# Patient Record
Sex: Female | Born: 1960 | Race: Black or African American | Hispanic: No | Marital: Married | Smoking: Never smoker
Health system: Southern US, Community
[De-identification: ages and names within clinical notes are randomized; demographics above are authoritative.]

## PROBLEM LIST (undated history)

## (undated) DIAGNOSIS — I639 Cerebral infarction, unspecified: Secondary | ICD-10-CM

## (undated) DIAGNOSIS — I428 Other cardiomyopathies: Secondary | ICD-10-CM

## (undated) DIAGNOSIS — E875 Hyperkalemia: Secondary | ICD-10-CM

## (undated) DIAGNOSIS — R42 Dizziness and giddiness: Principal | ICD-10-CM

## (undated) DIAGNOSIS — N186 End stage renal disease: Secondary | ICD-10-CM

## (undated) DIAGNOSIS — IMO0001 Reserved for inherently not codable concepts without codable children: Secondary | ICD-10-CM

## (undated) DIAGNOSIS — N189 Chronic kidney disease, unspecified: Secondary | ICD-10-CM

## (undated) DIAGNOSIS — E119 Type 2 diabetes mellitus without complications: Secondary | ICD-10-CM

## (undated) DIAGNOSIS — K219 Gastro-esophageal reflux disease without esophagitis: Secondary | ICD-10-CM

## (undated) DIAGNOSIS — Z7901 Long term (current) use of anticoagulants: Secondary | ICD-10-CM

## (undated) DIAGNOSIS — B9681 Helicobacter pylori [H. pylori] as the cause of diseases classified elsewhere: Principal | ICD-10-CM

## (undated) DIAGNOSIS — R1310 Dysphagia, unspecified: Secondary | ICD-10-CM

## (undated) DIAGNOSIS — Z794 Long term (current) use of insulin: Secondary | ICD-10-CM

## (undated) DIAGNOSIS — K2970 Gastritis, unspecified, without bleeding: Principal | ICD-10-CM

## (undated) DIAGNOSIS — I482 Chronic atrial fibrillation, unspecified: Secondary | ICD-10-CM

## (undated) DIAGNOSIS — D472 Monoclonal gammopathy: Secondary | ICD-10-CM

## (undated) DIAGNOSIS — Q246 Congenital heart block: Secondary | ICD-10-CM

## (undated) DIAGNOSIS — N200 Calculus of kidney: Secondary | ICD-10-CM

## (undated) DIAGNOSIS — R229 Localized swelling, mass and lump, unspecified: Secondary | ICD-10-CM

## (undated) DIAGNOSIS — Z95810 Presence of automatic (implantable) cardiac defibrillator: Secondary | ICD-10-CM

## (undated) DIAGNOSIS — I5022 Chronic systolic (congestive) heart failure: Secondary | ICD-10-CM

## (undated) HISTORY — DX: Chronic atrial fibrillation, unspecified: I48.20

## (undated) HISTORY — DX: Long term (current) use of anticoagulants: Z79.01

## (undated) HISTORY — DX: Congenital heart block: Q24.6

## (undated) HISTORY — DX: Helicobacter pylori (H. pylori) as the cause of diseases classified elsewhere: B96.81

## (undated) HISTORY — DX: Hyperkalemia: E87.5

## (undated) HISTORY — DX: Dysphagia, unspecified: R13.10

## (undated) HISTORY — DX: Gastritis, unspecified, without bleeding: K29.70

## (undated) HISTORY — DX: Gastro-esophageal reflux disease without esophagitis: K21.9

## (undated) HISTORY — DX: Chronic kidney disease, unspecified: N18.9

## (undated) HISTORY — DX: Dizziness and giddiness: R42

---

## 1988-10-26 DIAGNOSIS — N200 Calculus of kidney: Secondary | ICD-10-CM

## 1988-10-26 HISTORY — DX: Calculus of kidney: N20.0

## 1997-02-25 DIAGNOSIS — I639 Cerebral infarction, unspecified: Secondary | ICD-10-CM

## 1997-02-25 HISTORY — DX: Cerebral infarction, unspecified: I63.9

## 1997-08-09 ENCOUNTER — Inpatient Hospital Stay (HOSPITAL_COMMUNITY): Admission: AD | Admit: 1997-08-09 | Discharge: 1997-08-15 | Payer: Self-pay | Admitting: Cardiology

## 1997-09-25 HISTORY — PX: INSERT / REPLACE / REMOVE PACEMAKER: SUR710

## 1997-10-05 ENCOUNTER — Observation Stay (HOSPITAL_COMMUNITY): Admission: RE | Admit: 1997-10-05 | Discharge: 1997-10-06 | Payer: Self-pay | Admitting: Internal Medicine

## 2000-11-05 ENCOUNTER — Other Ambulatory Visit: Admission: RE | Admit: 2000-11-05 | Discharge: 2000-11-05 | Payer: Self-pay | Admitting: Specialist

## 2000-11-13 ENCOUNTER — Encounter: Payer: Self-pay | Admitting: Specialist

## 2000-11-13 ENCOUNTER — Ambulatory Visit (HOSPITAL_COMMUNITY): Admission: RE | Admit: 2000-11-13 | Discharge: 2000-11-13 | Payer: Self-pay | Admitting: Specialist

## 2001-06-17 ENCOUNTER — Ambulatory Visit (HOSPITAL_COMMUNITY): Admission: RE | Admit: 2001-06-17 | Discharge: 2001-06-17 | Payer: Self-pay | Admitting: Cardiology

## 2001-10-16 ENCOUNTER — Emergency Department (HOSPITAL_COMMUNITY): Admission: EM | Admit: 2001-10-16 | Discharge: 2001-10-16 | Payer: Self-pay | Admitting: Emergency Medicine

## 2001-11-17 ENCOUNTER — Ambulatory Visit (HOSPITAL_COMMUNITY): Admission: RE | Admit: 2001-11-17 | Discharge: 2001-11-17 | Payer: Self-pay | Admitting: Specialist

## 2001-11-17 ENCOUNTER — Encounter: Payer: Self-pay | Admitting: Specialist

## 2002-12-08 ENCOUNTER — Ambulatory Visit (HOSPITAL_COMMUNITY): Admission: RE | Admit: 2002-12-08 | Discharge: 2002-12-08 | Payer: Self-pay | Admitting: Specialist

## 2002-12-08 ENCOUNTER — Encounter: Payer: Self-pay | Admitting: Specialist

## 2002-12-29 ENCOUNTER — Emergency Department (HOSPITAL_COMMUNITY): Admission: EM | Admit: 2002-12-29 | Discharge: 2002-12-29 | Payer: Self-pay | Admitting: Emergency Medicine

## 2003-02-16 ENCOUNTER — Encounter: Admission: RE | Admit: 2003-02-16 | Discharge: 2003-05-17 | Payer: Self-pay | Admitting: Cardiology

## 2003-03-15 ENCOUNTER — Ambulatory Visit (HOSPITAL_COMMUNITY): Admission: RE | Admit: 2003-03-15 | Discharge: 2003-03-15 | Payer: Self-pay | Admitting: Internal Medicine

## 2003-04-25 ENCOUNTER — Emergency Department (HOSPITAL_COMMUNITY): Admission: EM | Admit: 2003-04-25 | Discharge: 2003-04-25 | Payer: Self-pay | Admitting: Emergency Medicine

## 2003-07-23 ENCOUNTER — Emergency Department (HOSPITAL_COMMUNITY): Admission: EM | Admit: 2003-07-23 | Discharge: 2003-07-23 | Payer: Self-pay | Admitting: Emergency Medicine

## 2003-07-25 ENCOUNTER — Emergency Department (HOSPITAL_COMMUNITY): Admission: EM | Admit: 2003-07-25 | Discharge: 2003-07-25 | Payer: Self-pay | Admitting: Emergency Medicine

## 2003-08-11 ENCOUNTER — Emergency Department (HOSPITAL_COMMUNITY): Admission: EM | Admit: 2003-08-11 | Discharge: 2003-08-11 | Payer: Self-pay | Admitting: Emergency Medicine

## 2003-08-12 ENCOUNTER — Emergency Department (HOSPITAL_COMMUNITY): Admission: EM | Admit: 2003-08-12 | Discharge: 2003-08-12 | Payer: Self-pay | Admitting: Emergency Medicine

## 2003-10-05 ENCOUNTER — Ambulatory Visit (HOSPITAL_COMMUNITY): Admission: RE | Admit: 2003-10-05 | Discharge: 2003-10-05 | Payer: Self-pay | Admitting: Internal Medicine

## 2003-11-25 ENCOUNTER — Ambulatory Visit: Payer: Self-pay | Admitting: Internal Medicine

## 2003-12-09 ENCOUNTER — Ambulatory Visit: Payer: Self-pay | Admitting: Internal Medicine

## 2003-12-22 ENCOUNTER — Ambulatory Visit (HOSPITAL_COMMUNITY): Admission: RE | Admit: 2003-12-22 | Discharge: 2003-12-22 | Payer: Self-pay | Admitting: Specialist

## 2004-01-18 ENCOUNTER — Ambulatory Visit: Payer: Self-pay | Admitting: Cardiology

## 2004-01-24 ENCOUNTER — Ambulatory Visit: Payer: Self-pay | Admitting: Internal Medicine

## 2004-01-24 ENCOUNTER — Ambulatory Visit: Payer: Self-pay

## 2004-01-26 ENCOUNTER — Ambulatory Visit: Payer: Self-pay | Admitting: Internal Medicine

## 2004-01-26 HISTORY — PX: PACEMAKER GENERATOR CHANGE: SHX5998

## 2004-01-30 ENCOUNTER — Ambulatory Visit: Payer: Self-pay | Admitting: Internal Medicine

## 2004-01-31 ENCOUNTER — Ambulatory Visit (HOSPITAL_COMMUNITY): Admission: RE | Admit: 2004-01-31 | Discharge: 2004-01-31 | Payer: Self-pay | Admitting: Internal Medicine

## 2004-02-08 ENCOUNTER — Ambulatory Visit: Payer: Self-pay | Admitting: Cardiology

## 2004-02-14 ENCOUNTER — Ambulatory Visit: Payer: Self-pay

## 2004-02-15 ENCOUNTER — Ambulatory Visit: Payer: Self-pay | Admitting: Cardiology

## 2004-03-01 ENCOUNTER — Ambulatory Visit: Payer: Self-pay | Admitting: *Deleted

## 2004-04-06 ENCOUNTER — Ambulatory Visit: Payer: Self-pay | Admitting: *Deleted

## 2004-05-15 ENCOUNTER — Ambulatory Visit: Payer: Self-pay | Admitting: Cardiology

## 2004-06-11 ENCOUNTER — Ambulatory Visit: Payer: Self-pay | Admitting: Cardiology

## 2004-07-11 ENCOUNTER — Ambulatory Visit: Payer: Self-pay | Admitting: *Deleted

## 2004-08-07 ENCOUNTER — Ambulatory Visit: Payer: Self-pay | Admitting: *Deleted

## 2004-08-14 ENCOUNTER — Ambulatory Visit: Payer: Self-pay | Admitting: Internal Medicine

## 2004-09-19 ENCOUNTER — Ambulatory Visit: Payer: Self-pay | Admitting: *Deleted

## 2004-11-02 ENCOUNTER — Ambulatory Visit: Payer: Self-pay | Admitting: *Deleted

## 2004-11-30 ENCOUNTER — Ambulatory Visit: Payer: Self-pay | Admitting: *Deleted

## 2004-12-24 ENCOUNTER — Ambulatory Visit (HOSPITAL_COMMUNITY): Admission: RE | Admit: 2004-12-24 | Discharge: 2004-12-24 | Payer: Self-pay | Admitting: Specialist

## 2004-12-31 ENCOUNTER — Ambulatory Visit: Payer: Self-pay | Admitting: Cardiology

## 2005-01-28 ENCOUNTER — Ambulatory Visit (HOSPITAL_COMMUNITY): Admission: RE | Admit: 2005-01-28 | Discharge: 2005-01-28 | Payer: Self-pay | Admitting: Nephrology

## 2005-02-04 ENCOUNTER — Ambulatory Visit: Payer: Self-pay | Admitting: *Deleted

## 2005-03-12 ENCOUNTER — Ambulatory Visit: Payer: Self-pay | Admitting: *Deleted

## 2005-04-17 ENCOUNTER — Ambulatory Visit: Payer: Self-pay | Admitting: *Deleted

## 2005-05-03 ENCOUNTER — Ambulatory Visit: Payer: Self-pay | Admitting: Internal Medicine

## 2005-05-14 ENCOUNTER — Ambulatory Visit: Payer: Self-pay | Admitting: Cardiology

## 2005-06-20 ENCOUNTER — Ambulatory Visit: Payer: Self-pay | Admitting: Cardiology

## 2005-07-17 ENCOUNTER — Ambulatory Visit: Payer: Self-pay | Admitting: *Deleted

## 2005-08-09 ENCOUNTER — Ambulatory Visit: Payer: Self-pay | Admitting: Internal Medicine

## 2005-08-22 ENCOUNTER — Ambulatory Visit: Payer: Self-pay | Admitting: Cardiology

## 2005-09-26 ENCOUNTER — Ambulatory Visit: Payer: Self-pay | Admitting: *Deleted

## 2005-10-29 ENCOUNTER — Ambulatory Visit: Payer: Self-pay | Admitting: Cardiology

## 2005-11-11 ENCOUNTER — Ambulatory Visit: Payer: Self-pay | Admitting: Internal Medicine

## 2005-11-13 ENCOUNTER — Ambulatory Visit: Payer: Self-pay | Admitting: Cardiology

## 2005-12-17 ENCOUNTER — Ambulatory Visit: Payer: Self-pay | Admitting: Internal Medicine

## 2005-12-21 ENCOUNTER — Ambulatory Visit (HOSPITAL_COMMUNITY): Admission: RE | Admit: 2005-12-21 | Discharge: 2005-12-21 | Payer: Self-pay | Admitting: Internal Medicine

## 2006-01-17 ENCOUNTER — Ambulatory Visit: Payer: Self-pay | Admitting: Cardiology

## 2006-02-03 ENCOUNTER — Ambulatory Visit (HOSPITAL_COMMUNITY): Admission: RE | Admit: 2006-02-03 | Discharge: 2006-02-03 | Payer: Self-pay | Admitting: Specialist

## 2006-02-06 ENCOUNTER — Ambulatory Visit: Payer: Self-pay | Admitting: Internal Medicine

## 2006-02-20 ENCOUNTER — Ambulatory Visit: Payer: Self-pay | Admitting: Cardiology

## 2006-03-11 ENCOUNTER — Ambulatory Visit: Payer: Self-pay | Admitting: Internal Medicine

## 2006-03-20 ENCOUNTER — Ambulatory Visit: Payer: Self-pay | Admitting: Cardiology

## 2006-04-25 ENCOUNTER — Ambulatory Visit: Payer: Self-pay | Admitting: Cardiology

## 2006-05-23 ENCOUNTER — Ambulatory Visit: Payer: Self-pay | Admitting: Cardiology

## 2006-06-04 ENCOUNTER — Ambulatory Visit: Payer: Self-pay | Admitting: Internal Medicine

## 2006-06-13 ENCOUNTER — Ambulatory Visit: Payer: Self-pay | Admitting: Cardiology

## 2006-07-14 ENCOUNTER — Ambulatory Visit: Payer: Self-pay | Admitting: Cardiology

## 2006-08-20 ENCOUNTER — Ambulatory Visit: Payer: Self-pay | Admitting: Cardiovascular Disease

## 2006-08-26 ENCOUNTER — Ambulatory Visit: Payer: Self-pay | Admitting: Internal Medicine

## 2006-09-17 ENCOUNTER — Ambulatory Visit: Payer: Self-pay | Admitting: Cardiovascular Disease

## 2006-10-15 ENCOUNTER — Ambulatory Visit: Payer: Self-pay | Admitting: Cardiovascular Disease

## 2006-11-18 ENCOUNTER — Ambulatory Visit: Payer: Self-pay | Admitting: Internal Medicine

## 2006-11-19 ENCOUNTER — Ambulatory Visit: Payer: Self-pay | Admitting: Cardiology

## 2006-12-03 ENCOUNTER — Ambulatory Visit: Payer: Self-pay | Admitting: Cardiovascular Disease

## 2006-12-19 ENCOUNTER — Emergency Department (HOSPITAL_COMMUNITY): Admission: EM | Admit: 2006-12-19 | Discharge: 2006-12-19 | Payer: Self-pay | Admitting: Emergency Medicine

## 2007-01-02 ENCOUNTER — Ambulatory Visit: Payer: Self-pay | Admitting: Cardiovascular Disease

## 2007-01-12 ENCOUNTER — Emergency Department (HOSPITAL_COMMUNITY): Admission: EM | Admit: 2007-01-12 | Discharge: 2007-01-12 | Payer: Self-pay | Admitting: Emergency Medicine

## 2007-02-02 ENCOUNTER — Ambulatory Visit: Payer: Self-pay | Admitting: Cardiology

## 2007-02-10 ENCOUNTER — Ambulatory Visit (HOSPITAL_COMMUNITY): Admission: RE | Admit: 2007-02-10 | Discharge: 2007-02-10 | Payer: Self-pay | Admitting: Specialist

## 2007-02-10 ENCOUNTER — Ambulatory Visit: Payer: Self-pay | Admitting: Internal Medicine

## 2007-02-12 ENCOUNTER — Ambulatory Visit: Payer: Self-pay | Admitting: Cardiovascular Disease

## 2007-03-05 ENCOUNTER — Ambulatory Visit: Payer: Self-pay | Admitting: Internal Medicine

## 2007-04-06 ENCOUNTER — Ambulatory Visit: Payer: Self-pay | Admitting: Internal Medicine

## 2007-05-05 ENCOUNTER — Ambulatory Visit: Payer: Self-pay | Admitting: Cardiology

## 2007-05-07 ENCOUNTER — Ambulatory Visit: Payer: Self-pay | Admitting: Internal Medicine

## 2007-06-03 ENCOUNTER — Ambulatory Visit: Payer: Self-pay | Admitting: Cardiology

## 2007-07-08 ENCOUNTER — Ambulatory Visit: Payer: Self-pay | Admitting: Cardiology

## 2007-07-21 ENCOUNTER — Ambulatory Visit: Payer: Self-pay | Admitting: Cardiology

## 2007-08-03 ENCOUNTER — Ambulatory Visit: Payer: Self-pay | Admitting: Cardiology

## 2007-08-06 ENCOUNTER — Ambulatory Visit (HOSPITAL_COMMUNITY): Admission: RE | Admit: 2007-08-06 | Discharge: 2007-08-06 | Payer: Self-pay | Admitting: Internal Medicine

## 2007-08-06 ENCOUNTER — Ambulatory Visit: Payer: Self-pay | Admitting: Cardiovascular Disease

## 2007-09-01 ENCOUNTER — Ambulatory Visit: Payer: Self-pay | Admitting: Cardiology

## 2007-09-30 ENCOUNTER — Ambulatory Visit: Payer: Self-pay | Admitting: Cardiology

## 2007-10-08 ENCOUNTER — Ambulatory Visit: Payer: Self-pay | Admitting: Cardiology

## 2007-10-22 ENCOUNTER — Ambulatory Visit: Payer: Self-pay | Admitting: Cardiology

## 2007-10-26 ENCOUNTER — Emergency Department (HOSPITAL_COMMUNITY): Admission: EM | Admit: 2007-10-26 | Discharge: 2007-10-26 | Payer: Self-pay | Admitting: Emergency Medicine

## 2007-11-06 ENCOUNTER — Ambulatory Visit: Payer: Self-pay | Admitting: Internal Medicine

## 2007-11-12 ENCOUNTER — Ambulatory Visit: Payer: Self-pay | Admitting: Cardiology

## 2007-12-10 ENCOUNTER — Ambulatory Visit: Payer: Self-pay | Admitting: Cardiology

## 2007-12-17 ENCOUNTER — Emergency Department (HOSPITAL_COMMUNITY): Admission: EM | Admit: 2007-12-17 | Discharge: 2007-12-17 | Payer: Self-pay | Admitting: Emergency Medicine

## 2007-12-24 ENCOUNTER — Ambulatory Visit: Payer: Self-pay | Admitting: Cardiology

## 2008-01-03 ENCOUNTER — Emergency Department (HOSPITAL_COMMUNITY): Admission: EM | Admit: 2008-01-03 | Discharge: 2008-01-03 | Payer: Self-pay | Admitting: Emergency Medicine

## 2008-01-06 ENCOUNTER — Ambulatory Visit: Payer: Self-pay | Admitting: Internal Medicine

## 2008-01-20 DIAGNOSIS — I1310 Hypertensive heart and chronic kidney disease without heart failure, with stage 1 through stage 4 chronic kidney disease, or unspecified chronic kidney disease: Secondary | ICD-10-CM

## 2008-01-20 DIAGNOSIS — I428 Other cardiomyopathies: Secondary | ICD-10-CM

## 2008-01-20 DIAGNOSIS — E1129 Type 2 diabetes mellitus with other diabetic kidney complication: Secondary | ICD-10-CM

## 2008-01-20 DIAGNOSIS — I4891 Unspecified atrial fibrillation: Secondary | ICD-10-CM | POA: Insufficient documentation

## 2008-01-20 DIAGNOSIS — E1165 Type 2 diabetes mellitus with hyperglycemia: Secondary | ICD-10-CM

## 2008-02-04 ENCOUNTER — Ambulatory Visit: Payer: Self-pay | Admitting: Cardiology

## 2008-02-05 ENCOUNTER — Ambulatory Visit: Payer: Self-pay | Admitting: Internal Medicine

## 2008-02-12 ENCOUNTER — Ambulatory Visit (HOSPITAL_COMMUNITY): Admission: RE | Admit: 2008-02-12 | Discharge: 2008-02-12 | Payer: Self-pay | Admitting: Obstetrics and Gynecology

## 2008-03-04 ENCOUNTER — Other Ambulatory Visit: Admission: RE | Admit: 2008-03-04 | Discharge: 2008-03-04 | Payer: Self-pay | Admitting: Obstetrics and Gynecology

## 2008-03-07 ENCOUNTER — Ambulatory Visit: Payer: Self-pay | Admitting: Cardiology

## 2008-03-08 ENCOUNTER — Ambulatory Visit (HOSPITAL_COMMUNITY): Admission: RE | Admit: 2008-03-08 | Discharge: 2008-03-08 | Payer: Self-pay | Admitting: Internal Medicine

## 2008-03-08 ENCOUNTER — Ambulatory Visit: Payer: Self-pay | Admitting: Internal Medicine

## 2008-04-06 ENCOUNTER — Encounter: Payer: Self-pay | Admitting: Internal Medicine

## 2008-04-14 ENCOUNTER — Ambulatory Visit: Payer: Self-pay | Admitting: Cardiology

## 2008-05-12 ENCOUNTER — Ambulatory Visit: Payer: Self-pay | Admitting: Cardiology

## 2008-06-13 ENCOUNTER — Ambulatory Visit: Payer: Self-pay | Admitting: Cardiology

## 2008-06-15 ENCOUNTER — Ambulatory Visit (HOSPITAL_COMMUNITY): Admission: RE | Admit: 2008-06-15 | Discharge: 2008-06-15 | Payer: Self-pay | Admitting: Internal Medicine

## 2008-06-16 ENCOUNTER — Ambulatory Visit: Payer: Self-pay | Admitting: Internal Medicine

## 2008-07-07 ENCOUNTER — Ambulatory Visit: Payer: Self-pay | Admitting: Cardiology

## 2008-07-12 ENCOUNTER — Encounter: Payer: Self-pay | Admitting: Cardiology

## 2008-07-12 ENCOUNTER — Ambulatory Visit: Payer: Self-pay | Admitting: Cardiovascular Disease

## 2008-07-12 ENCOUNTER — Ambulatory Visit (HOSPITAL_COMMUNITY): Admission: RE | Admit: 2008-07-12 | Discharge: 2008-07-12 | Payer: Self-pay | Admitting: Cardiology

## 2008-08-05 ENCOUNTER — Ambulatory Visit: Payer: Self-pay | Admitting: Cardiology

## 2008-08-05 ENCOUNTER — Encounter: Payer: Self-pay | Admitting: Internal Medicine

## 2008-08-19 ENCOUNTER — Ambulatory Visit: Payer: Self-pay | Admitting: Cardiology

## 2008-08-19 ENCOUNTER — Encounter: Payer: Self-pay | Admitting: Cardiology

## 2008-08-25 ENCOUNTER — Ambulatory Visit: Payer: Self-pay | Admitting: Cardiology

## 2008-09-15 ENCOUNTER — Ambulatory Visit: Payer: Self-pay | Admitting: Cardiology

## 2008-09-21 ENCOUNTER — Ambulatory Visit: Payer: Self-pay | Admitting: Internal Medicine

## 2008-10-10 ENCOUNTER — Encounter: Payer: Self-pay | Admitting: *Deleted

## 2008-11-09 ENCOUNTER — Ambulatory Visit: Payer: Self-pay

## 2008-11-16 ENCOUNTER — Encounter: Payer: Self-pay | Admitting: Cardiology

## 2008-11-24 ENCOUNTER — Encounter: Payer: Self-pay | Admitting: Cardiology

## 2008-12-08 ENCOUNTER — Ambulatory Visit: Payer: Self-pay | Admitting: Cardiology

## 2008-12-21 ENCOUNTER — Ambulatory Visit: Payer: Self-pay | Admitting: Internal Medicine

## 2008-12-21 ENCOUNTER — Encounter: Payer: Self-pay | Admitting: Cardiology

## 2008-12-29 ENCOUNTER — Ambulatory Visit: Payer: Self-pay | Admitting: Cardiology

## 2008-12-29 LAB — CONVERTED CEMR LAB: POC INR: 3

## 2009-01-02 ENCOUNTER — Encounter (INDEPENDENT_AMBULATORY_CARE_PROVIDER_SITE_OTHER): Payer: Self-pay | Admitting: *Deleted

## 2009-01-02 ENCOUNTER — Encounter: Payer: Self-pay | Admitting: Cardiology

## 2009-01-02 LAB — CONVERTED CEMR LAB
Basophils Absolute: 0.1 10*3/uL (ref 0.0–0.1)
Eosinophils Absolute: 0.1 10*3/uL (ref 0.0–0.7)
Eosinophils Relative: 2 % (ref 0–5)
HCT: 37 % (ref 36.0–46.0)
Lymphocytes Relative: 27 % (ref 12–46)
Platelets: 234 10*3/uL
Platelets: 234 10*3/uL (ref 150–400)
RDW: 14.7 % (ref 11.5–15.5)
WBC: 6.4 10*3/uL

## 2009-01-04 ENCOUNTER — Telehealth (INDEPENDENT_AMBULATORY_CARE_PROVIDER_SITE_OTHER): Payer: Self-pay | Admitting: *Deleted

## 2009-01-09 ENCOUNTER — Telehealth: Payer: Self-pay | Admitting: Cardiology

## 2009-01-09 ENCOUNTER — Encounter: Payer: Self-pay | Admitting: Cardiology

## 2009-01-30 ENCOUNTER — Ambulatory Visit: Payer: Self-pay | Admitting: Cardiovascular Disease

## 2009-01-30 LAB — CONVERTED CEMR LAB: POC INR: 2.4

## 2009-03-07 ENCOUNTER — Ambulatory Visit (HOSPITAL_COMMUNITY): Admission: RE | Admit: 2009-03-07 | Discharge: 2009-03-07 | Payer: Self-pay | Admitting: Obstetrics and Gynecology

## 2009-03-08 ENCOUNTER — Ambulatory Visit: Payer: Self-pay | Admitting: Cardiology

## 2009-04-18 ENCOUNTER — Encounter (INDEPENDENT_AMBULATORY_CARE_PROVIDER_SITE_OTHER): Payer: Self-pay

## 2009-04-18 LAB — CONVERTED CEMR LAB
Albumin: 4.2 g/dL
Alkaline Phosphatase: 3.6 units/L
BUN: 49 mg/dL
Cholesterol: 122 mg/dL
Creatinine, Ser: 1.8 mg/dL
Glucose, Bld: 143 mg/dL
HDL: 31 mg/dL
Hgb A1c MFr Bld: 9.1 %
LDL Cholesterol: 30 mg/dL
Potassium: 4.5 meq/L
Sodium: 133 meq/L
Triglycerides: 149 mg/dL

## 2009-04-19 ENCOUNTER — Other Ambulatory Visit: Admission: RE | Admit: 2009-04-19 | Discharge: 2009-04-19 | Payer: Self-pay | Admitting: Obstetrics and Gynecology

## 2009-04-20 ENCOUNTER — Encounter (INDEPENDENT_AMBULATORY_CARE_PROVIDER_SITE_OTHER): Payer: Self-pay

## 2009-04-24 ENCOUNTER — Ambulatory Visit: Payer: Self-pay | Admitting: Internal Medicine

## 2009-04-24 ENCOUNTER — Ambulatory Visit: Payer: Self-pay | Admitting: Cardiology

## 2009-04-24 DIAGNOSIS — Z950 Presence of cardiac pacemaker: Secondary | ICD-10-CM

## 2009-05-18 ENCOUNTER — Ambulatory Visit: Payer: Self-pay | Admitting: Cardiology

## 2009-05-18 LAB — CONVERTED CEMR LAB: POC INR: 1.8

## 2009-06-06 ENCOUNTER — Encounter (INDEPENDENT_AMBULATORY_CARE_PROVIDER_SITE_OTHER): Payer: Self-pay | Admitting: *Deleted

## 2009-06-07 ENCOUNTER — Ambulatory Visit: Payer: Self-pay | Admitting: Cardiology

## 2009-06-14 ENCOUNTER — Ambulatory Visit: Payer: Self-pay | Admitting: Internal Medicine

## 2009-06-23 ENCOUNTER — Telehealth (INDEPENDENT_AMBULATORY_CARE_PROVIDER_SITE_OTHER): Payer: Self-pay

## 2009-06-26 ENCOUNTER — Encounter: Payer: Self-pay | Admitting: Cardiology

## 2009-07-06 ENCOUNTER — Encounter (INDEPENDENT_AMBULATORY_CARE_PROVIDER_SITE_OTHER): Payer: Self-pay | Admitting: *Deleted

## 2009-07-10 ENCOUNTER — Ambulatory Visit: Payer: Self-pay | Admitting: Cardiology

## 2009-07-10 LAB — CONVERTED CEMR LAB
Albumin: 4.5 g/dL
GFR calc non Af Amer: 28 mL/min
Glomerular Filtration Rate, Af Am: 32 mL/min/{1.73_m2}
HCT: 39.5 %
Hemoglobin: 13.1 g/dL
Potassium: 4.9 meq/L
Sodium: 141 meq/L
Uric Acid, Serum: 7.6 mg/dL

## 2009-07-11 ENCOUNTER — Encounter: Payer: Self-pay | Admitting: Cardiology

## 2009-07-20 ENCOUNTER — Telehealth (INDEPENDENT_AMBULATORY_CARE_PROVIDER_SITE_OTHER): Payer: Self-pay | Admitting: *Deleted

## 2009-07-20 ENCOUNTER — Encounter (INDEPENDENT_AMBULATORY_CARE_PROVIDER_SITE_OTHER): Payer: Self-pay | Admitting: *Deleted

## 2009-08-07 ENCOUNTER — Ambulatory Visit: Payer: Self-pay | Admitting: Cardiology

## 2009-08-16 ENCOUNTER — Encounter (INDEPENDENT_AMBULATORY_CARE_PROVIDER_SITE_OTHER): Payer: Self-pay | Admitting: *Deleted

## 2009-08-22 ENCOUNTER — Ambulatory Visit: Payer: Self-pay | Admitting: Cardiology

## 2009-08-25 ENCOUNTER — Encounter (INDEPENDENT_AMBULATORY_CARE_PROVIDER_SITE_OTHER): Payer: Self-pay | Admitting: *Deleted

## 2009-08-29 ENCOUNTER — Telehealth (INDEPENDENT_AMBULATORY_CARE_PROVIDER_SITE_OTHER): Payer: Self-pay | Admitting: *Deleted

## 2009-09-13 ENCOUNTER — Ambulatory Visit: Payer: Self-pay | Admitting: Internal Medicine

## 2009-09-20 ENCOUNTER — Ambulatory Visit: Payer: Self-pay | Admitting: Cardiology

## 2009-09-20 LAB — CONVERTED CEMR LAB: POC INR: 2.5

## 2009-10-05 ENCOUNTER — Ambulatory Visit: Payer: Self-pay | Admitting: Cardiology

## 2009-10-18 ENCOUNTER — Ambulatory Visit: Payer: Self-pay | Admitting: Cardiology

## 2009-10-18 LAB — CONVERTED CEMR LAB: POC INR: 2.8

## 2009-11-15 ENCOUNTER — Ambulatory Visit: Payer: Self-pay | Admitting: Cardiology

## 2009-11-15 LAB — CONVERTED CEMR LAB: POC INR: 2.2

## 2009-12-13 ENCOUNTER — Encounter (INDEPENDENT_AMBULATORY_CARE_PROVIDER_SITE_OTHER): Payer: Self-pay | Admitting: *Deleted

## 2009-12-18 ENCOUNTER — Ambulatory Visit: Payer: Self-pay | Admitting: Internal Medicine

## 2009-12-24 ENCOUNTER — Emergency Department (HOSPITAL_COMMUNITY): Admission: EM | Admit: 2009-12-24 | Discharge: 2009-12-24 | Payer: Self-pay | Admitting: Emergency Medicine

## 2009-12-27 ENCOUNTER — Ambulatory Visit: Payer: Self-pay | Admitting: Cardiology

## 2010-01-04 ENCOUNTER — Encounter: Payer: Self-pay | Admitting: Cardiology

## 2010-01-09 ENCOUNTER — Encounter: Payer: Self-pay | Admitting: Cardiology

## 2010-01-24 ENCOUNTER — Encounter (INDEPENDENT_AMBULATORY_CARE_PROVIDER_SITE_OTHER): Payer: Self-pay | Admitting: *Deleted

## 2010-01-31 ENCOUNTER — Ambulatory Visit: Payer: Self-pay | Admitting: Cardiovascular Disease

## 2010-01-31 LAB — CONVERTED CEMR LAB: POC INR: 2.8

## 2010-02-28 ENCOUNTER — Ambulatory Visit: Admission: RE | Admit: 2010-02-28 | Discharge: 2010-02-28 | Payer: Self-pay | Source: Home / Self Care

## 2010-02-28 LAB — CONVERTED CEMR LAB: POC INR: 1.9

## 2010-03-17 ENCOUNTER — Encounter: Payer: Self-pay | Admitting: Nephrology

## 2010-03-18 ENCOUNTER — Encounter: Payer: Self-pay | Admitting: Obstetrics and Gynecology

## 2010-03-18 ENCOUNTER — Encounter: Payer: Self-pay | Admitting: Specialist

## 2010-03-19 ENCOUNTER — Encounter: Payer: Self-pay | Admitting: Internal Medicine

## 2010-03-27 ENCOUNTER — Ambulatory Visit (HOSPITAL_COMMUNITY): Admission: RE | Admit: 2010-03-27 | Payer: Self-pay | Source: Home / Self Care | Admitting: Obstetrics and Gynecology

## 2010-03-27 ENCOUNTER — Ambulatory Visit: Payer: Self-pay | Admitting: Internal Medicine

## 2010-03-27 NOTE — Letter (Signed)
Summary: LIPITOR APPROVAL  LIPITOR APPROVAL   Imported By: Nevada Crane 06/26/2009 11:45:01  _____________________________________________________________________  External Attachment:    Type:   Image     Comment:   External Document

## 2010-03-27 NOTE — Medication Information (Signed)
Summary: ccr-lr  Anticoagulant Therapy  Managed by: Edrick Oh, RN Referring MD: [name] PCP: Dr.Fanta Supervising MD: Domenic Polite MD, Mikeal Hawthorne Indication 1: Atrial Fibrillation (ICD-427.31) Lab Used: [facility] [place] Site: [place] INR POC 2.2  Dietary changes: no    Health status changes: no    Bleeding/hemorrhagic complications: no    Recent/future hospitalizations: no    Any changes in medication regimen? no    Recent/future dental: no  Any missed doses?: no       Is patient compliant with meds? yes       Allergies: No Known Drug Allergies  Anticoagulation Management History:      The patient is taking warfarin and comes in today for a routine follow up visit.  Positive risk factors for bleeding include presence of serious comorbidities.  Negative risk factors for bleeding include an age less than 51 years old.  The bleeding index is 'intermediate risk'.  Positive CHADS2 values include History of Diabetes.  Negative CHADS2 values include Age > 51 years old.  The start date was 12/16/1998.  Anticoagulation responsible provider: Domenic Polite MD, Mikeal Hawthorne.  INR POC: 2.2.  Cuvette Lot#: [id number].  Exp: 10/11.    Anticoagulation Management Assessment/Plan:      The patient's current anticoagulation dose is Coumadin 5 mg tabs: by mouth as directed.  The target INR is 2 - 3.  The next INR is due 12/13/2009.  Anticoagulation instructions were given to patient.  Results were reviewed/authorized by Edrick Oh, RN.  She was notified by Edrick Oh RN.         Prior Anticoagulation Instructions: INR 2.8 Continue coumadin 2.5mg  once daily except 5mg  on Sundays, Tuesdays and Thursdays  Current Anticoagulation Instructions: INR 2.2 Continue coumadin 2.5mg  once daily except 5mg  on Sundays, Tuesdays and Thursdays

## 2010-03-27 NOTE — Medication Information (Signed)
Summary: ccr-lr  Anticoagulant Therapy  Managed by: Edrick Oh, RN Referring MD: [name] PCP: Dr.Fanta Supervising MD: Domenic Polite MD, Mikeal Hawthorne Indication 1: Atrial Fibrillation (ICD-427.31) Lab Used: [facility] [place] Site: [place] INR POC 2.8  Dietary changes: no    Health status changes: no    Bleeding/hemorrhagic complications: no    Recent/future hospitalizations: no    Any changes in medication regimen? no    Recent/future dental: no  Any missed doses?: no       Is patient compliant with meds? yes       Allergies: No Known Drug Allergies  Anticoagulation Management History:      The patient is taking warfarin and comes in today for a routine follow up visit.  Positive risk factors for bleeding include presence of serious comorbidities.  Negative risk factors for bleeding include an age less than 43 years old.  The bleeding index is 'intermediate risk'.  Positive CHADS2 values include History of Diabetes.  Negative CHADS2 values include Age > 79 years old.  The start date was 12/16/1998.  Anticoagulation responsible provider: Domenic Polite MD, Mikeal Hawthorne.  INR POC: 2.8.  Cuvette Lot#: [id number].  Exp: 10/11.    Anticoagulation Management Assessment/Plan:      The patient's current anticoagulation dose is Coumadin 5 mg tabs: by mouth as directed.  The target INR is 2 - 3.  The next INR is due 11/15/2009.  Anticoagulation instructions were given to patient.  Results were reviewed/authorized by Edrick Oh, RN.  She was notified by Edrick Oh RN.         Prior Anticoagulation Instructions: INR 2.5 Continue coumadin 2.5mg  once daily except 5mg  on Sundays, Tuesdays and Thursdays  Current Anticoagulation Instructions: INR 2.8 Continue coumadin 2.5mg  once daily except 5mg  on Sundays, Tuesdays and Thursdays

## 2010-03-27 NOTE — Assessment & Plan Note (Signed)
Summary: F1Y  Medications Added NOVOLOG FLEXPEN 100 UNIT/ML SOLN (INSULIN ASPART) 8 AM,14U AT LUNCH,18 SUPPER TIME HUMULIN N 100 UNIT/ML SUSP (INSULIN ISOPHANE HUMAN) 36 U AT BEDTIME      Allergies Added: NKDA  Visit Type:  Follow-up Primary Provider:  Dr.Fanta  CC:  .Marland Kitchen  History of Present Illness: [name] returns the office as scheduled for continued assessment and treatment of long-standing nonischemic cardiomyopathy and sick sinus syndrome.  Since her last visit, she has done quite well.  She is fairly active, doing her housework and walking on a daily basis without much in the way of symptoms.  She has had no orthopnea, PND or ankle edema.  She notes no lightheadedness and certainly no syncope.  Her pacemaker is assessed regularly by our electrophysiology service and continues to function normally.  Her most recent echocardiogram was performed apparently one year ago and showed stable left ventricular dysfunction with an ejection fraction of 25-30%.   Current Medications (verified): 1)  Spironolactone 50 Mg Tabs (Spironolactone) .... One Half By Mouth Daily 2)  Klor-Con 20 Meq Pack (Potassium Chloride) .... One By Mouth Daily 3)  Coreg 12.5 Mg Tabs (Carvedilol) .... One By Mouth Two Times A Day 4)  Furosemide 40 Mg Tabs (Furosemide) .... One and One Half By Mouth Daily 5)  Tandem 162-115.2 Mg Caps (Ferrous Fum-Iron Polysacch) .... One By Mouth Daily 6)  Coumadin 5 Mg Tabs (Warfarin Sodium) .... By Mouth As Directed 7)  Benazepril Hcl 20 Mg Tabs (Benazepril Hcl) .... One By Mouth Daily 8)  Lipitor 10 Mg Tabs (Atorvastatin Calcium) .... One Half By Mouth Daily 9)  Lanoxin 0.125 Mg Tabs (Digoxin) .... One By Mouth Daily 10)  Novolog Flexpen 100 Unit/ml Soln (Insulin Aspart) .... 8 Am,14u At Lunch,18 Supper Time 11)  Lantus For Opticlik 100 Unit/ml Soln (Insulin Glargine) .... 32 Units Am 28 U Bedtime 12)  Humulin N 100 Unit/ml Susp (Insulin Isophane Human) .... 36 U At  Bedtime 13)  Allopurinol 100 Mg Tabs (Allopurinol) .... Take 1 Tab Daily 14)  Calcium-Vitamin D 250-125 Mg-Unit Tabs (Calcium Carbonate-Vitamin D) .... Take 1 Cap Two Times A Day  Allergies (verified): No Known Drug Allergies  Past History:  PMH, FH, and Social History reviewed and updated.  Review of Systems       See history of present illness.  Vital Signs:  Patient profile:   50 year old female Weight:      186 pounds Pulse rate:   68 / minute BP sitting:   112 / 78  (right arm)  Vitals Entered By: Doretha Sou, CNA (Jul 10, 2009 1:48 PM)  Physical Exam  General:    Overweight; well developed, well nourished, in no acute distress.  HEENT: normal Neck: supple. No JVD. Carotids 2+ bilaterally no bruits Cor: normal first and second heart sounds; prominent splitting of the second heart sound; minimal early systolic ejection murmur Lungs: CTA.  Abdomen: soft, nontender. nondistended. No HSM. Good bowel sounds Ext: warm. no cyanosis, clubbing or edema; normal distal pulses Neuro: alert and oriented. symmetric strength and tone    PPM Specifications Following MD:  [name], MD     PPM Vendor:  [facility] [employer]     PPM Model Number:  [id number]     PPM Serial Number:  [id number] DOI:  01/31/2004      Lead 1    Location: RV     DOI: 10/05/1997     Model #: [id number]  Serial #: [id number]     Status: active  Magnet Response Rate:  BOL 100 ERI  85  Indications:  CHB;A-fib +coumadin  Explantation Comments:  TTM's with Mednet Pacemaker dependent  PPM Follow Up Pacer Dependent:  Yes      Episodes Coumadin:  Yes  Parameters Mode:  SSIR     Lower Rate Limit:  60     Upper Rate Limit:  130  Impression & Recommendations:  Problem # 1:  CARDIAC PACEMAKER IN SITU (ICD-V45.01) Coarse atrial fibrillation/atrial flutter persists with no native conduction and demand ventricular pacing.  She has known complete AV block.  Problem # 2:  ATRIAL FIBRILLATION, CHRONIC  (ICD-427.31) Her risk of thromboembolism is substantial, and continuing full anticoagulation will be necessary.  Stool for Hemoccult testing will be obtained to monitor her warfarin therapy.  CBC 5 months ago revealed mild anemia with a hemoglobin of 11.2 and a hematocrit of 37.2.  She is planning for routine colonoscopy in the near future since she is approaching 50 years of age.  This should not represent a substantial stress on her cardiovascular system, and she will be able to hold warfarin for 3 days prior to the procedure.  Problem # 3:  CARDIOMYOPATHY (ICD-425.4) Patient has been extremely well compensated since she first presented with congestive heart failure approximately 15 years ago.  Current medications will be continued.  I will plan to see this nice woman again in 11 months.  Other Orders: Hemoccult Cards (Take Home) (Hemoccult Cards) T-CBC w/Diff [phone])  Patient Instructions: 1)  Your physician recommends that you schedule a follow-up appointment in: 11 months 2)  Your physician recommends that you return for lab work [id number] 3)  Your physician has asked that you test your stool for blood. It is necessary to test 3 different stool specimens for accuracy. You will be given 3 hemoccult cards for specimen collection. For each stool specimen, place a small portion of stool sample (from 2 different areas of the stool) into the 2 squares on the card. Close card. Repeat with 2 more stool specimens. Bring the cards back to the office for testing. Prescriptions: LANOXIN 0.125 MG TABS (DIGOXIN) one by mouth daily  #30 x 6   Entered by:   Tye Savoy RN   Authorized by:   Yehuda Savannah, MD, [facility]   Signed by:   Tye Savoy RN on 07/11/2009   Method used:   Electronically to        [facility] (retail)       [address]       [place], [place]  [address]       Ph: [phone]       Fax: [phone]   RxID:   [id number]

## 2010-03-27 NOTE — Medication Information (Signed)
Summary: ccr-lr  Anticoagulant Therapy  Managed by: Edrick Oh, RN Referring MD: [name] PCP: Dr.Fanta Supervising MD: Domenic Polite MD, Mikeal Hawthorne Indication 1: Atrial Fibrillation (ICD-427.31) Lab Used: [facility] [place] Site: [place] INR POC 1.8  Dietary changes: no    Health status changes: no    Bleeding/hemorrhagic complications: no    Recent/future hospitalizations: no    Any changes in medication regimen? no    Recent/future dental: no  Any missed doses?: no       Is patient compliant with meds? yes       Allergies: No Known Drug Allergies  Anticoagulation Management History:      The patient is taking warfarin and comes in today for a routine follow up visit.  Positive risk factors for bleeding include presence of serious comorbidities.  Negative risk factors for bleeding include an age less than 37 years old.  The bleeding index is 'intermediate risk'.  Positive CHADS2 values include History of Diabetes.  Negative CHADS2 values include Age > 77 years old.  The start date was 12/16/1998.  Anticoagulation responsible provider: Domenic Polite MD, Mikeal Hawthorne.  INR POC: 1.8.  Cuvette Lot#: [id number].  Exp: 10/11.    Anticoagulation Management Assessment/Plan:      The patient's current anticoagulation dose is Coumadin 5 mg tabs: by mouth as directed.  The target INR is 2 - 3.  The next INR is due 06/05/2009.  Anticoagulation instructions were given to patient.  Results were reviewed/authorized by Edrick Oh, RN.  She was notified by Edrick Oh RN.         Prior Anticoagulation Instructions: INR 1.7 Take coumadin 1 1/2 tablets tonight then increase dose to 1 tablet once daily except 1/2 tablet on Tuesdays, Thursdays and Saturdays  Current Anticoagulation Instructions: INR 1.8 Take coumadin 1 tablet tonight then increase dose to 5mg  once daily except 2.5mg  on Tuesdays and Thursdays

## 2010-03-27 NOTE — Miscellaneous (Signed)
Summary: hemoccult cards 08/16/2009  Clinical Lists Changes  Observations: Added new observation of HEMOCCULT 3: neg (08/16/2009 8:18) Added new observation of HEMOCCULT 2: neg (08/16/2009 8:18) Added new observation of HEMOCCULT 1: neg (08/16/2009 8:18)

## 2010-03-27 NOTE — Letter (Signed)
Summary: DRUG REQUEST FORM  DRUG REQUEST FORM   Imported By: Nevada Crane 01/09/2010 10:07:15  _____________________________________________________________________  External Attachment:    Type:   Image     Comment:   External Document

## 2010-03-27 NOTE — Letter (Signed)
Summary: Appointment - Missed  [facility] [place]. [address], [place] [address]   Phone: [phone]  Fax: [phone]     December 13, 2009 MRN: [id number]   [name] [address] [place], [place]  [address]   Dear Ms. [employer],  Our records indicate you missed your appointment on        12/12/09 [facility] CLIN[facility]         It is very important that we reach you to reschedule this appointment. We look forward to participating in your health care needs. Please contact us at the number listed above at your earliest convenience to reschedule this appointment.     Sincerely,    Public relations account executive

## 2010-03-27 NOTE — Assessment & Plan Note (Signed)
Summary: PC2  Medications Added LANTUS FOR OPTICLIK 100 UNIT/ML SOLN (INSULIN GLARGINE) 32 units am 28 u bedtime HUMULIN N 100 UNIT/ML SUSP (INSULIN ISOPHANE HUMAN) 36units at bedtime ALLOPURINOL 100 MG TABS (ALLOPURINOL) take 1 tab daily CALCIUM-VITAMIN D 250-125 MG-UNIT TABS (CALCIUM CARBONATE-VITAMIN D) take 1 cap two times a day      Allergies Added: NKDA  Visit Type:  Follow-up Primary Provider:  Dr.Fanta  CC:  no complaints today.  History of Present Illness: [name] returns today for followup.  She is a pleasant 50 yo woman with a h/o CHB and atrial fibrillation and a non-ischemic CM.  She has class 1 CHF.  She has had no syncope or c/p or peripheral edema. Her blood pressure is well controlled.   Current Medications (verified): 1)  Spironolactone 50 Mg Tabs (Spironolactone) .... One Half By Mouth Daily 2)  Klor-Con 20 Meq Pack (Potassium Chloride) .... One By Mouth Daily 3)  Coreg 12.5 Mg Tabs (Carvedilol) .... One By Mouth Two Times A Day 4)  Furosemide 40 Mg Tabs (Furosemide) .... One and One Half By Mouth Daily 5)  Tandem 162-115.2 Mg Caps (Ferrous Fum-Iron Polysacch) .... One By Mouth Daily 6)  Coumadin 5 Mg Tabs (Warfarin Sodium) .... By Mouth As Directed 7)  Benazepril Hcl 20 Mg Tabs (Benazepril Hcl) .... One By Mouth Daily 8)  Lipitor 10 Mg Tabs (Atorvastatin Calcium) .... One Half By Mouth Daily 9)  Lanoxin 0.125 Mg Tabs (Digoxin) .... One By Mouth Daily 10)  Novolog Flexpen 100 Unit/ml Soln (Insulin Aspart) .... Sliding Scale Three Times A Day 11)  Lantus For Opticlik 100 Unit/ml Soln (Insulin Glargine) .... 32 Units Am 28 U Bedtime 12)  Humulin N 100 Unit/ml Susp (Insulin Isophane Human) .... 36units At Bedtime 13)  Allopurinol 100 Mg Tabs (Allopurinol) .... Take 1 Tab Daily 14)  Calcium-Vitamin D 250-125 Mg-Unit Tabs (Calcium Carbonate-Vitamin D) .... Take 1 Cap Two Times A Day  Allergies (verified): No Known Drug Allergies  Past History:  Past  Medical History: Last updated: 01/20/2008 AODM-insulin treatment. Congenital third degree AV block; Guidant VVI pacemaker implanted in 09/1997 with a generator change in 01/2004. Chronic atrial fibrillation; embolic RIND. Hypertension. Cardiomyopathy with congestive heart failure; ejection fraction of 30% in 8/05. Chronic kidney disease: Creatinine-1.8 in 09/2006 and 2.0 in 05/2007  Past Surgical History: Last updated: 01/20/2008 Pacemaker implant-1999  Review of Systems  The patient denies chest pain, syncope, dyspnea on exertion, and peripheral edema.    Vital Signs:  Patient profile:   50 year old female Height:      65 inches Weight:      191 pounds BMI:     31.90 Pulse rate:   66 / minute BP sitting:   125 / 68  (right arm)  Vitals Entered By: Doretha Sou, CNA (April 24, 2009 10:57 AM)  Physical Exam  General:  Well developed, well nourished, in no acute distress.  HEENT: normal Neck: supple. No JVD. Carotids 2+ bilaterally no bruits Cor: RRR no rubs, gallops or murmur Lungs: CTA. PM incision well heeled. Ab: soft, nontender. nondistended. No HSM. Good bowel sounds Ext: warm. no cyanosis, clubbing or edema Neuro: alert and oriented. Grossly nonfocal. affect pleasant    PPM Specifications Following MD:  [name], MD     PPM Vendor:  [employer] Number:  [id number]     PPM Serial Number:  [id number] PPM DOI:  01/31/2004  Lead 1    Location: RV     DOI: 10/05/1997     Model #: [id number]     Serial #: [id number]     Status: active  Magnet Response Rate:  BOL 100 ERI  85  Indications:  CHB;A-fib +coumadin  Explantation Comments:  TTM's with Mednet Pacemaker dependent  PPM Follow Up Remote Check?  No Battery Voltage:  good V     Battery Est. Longevity:  >5 years     Pacer Dependent:  Yes     Right Ventricle  Impedance: 350 ohms, Threshold: 0.9 V at 0.2 msec  Episodes Coumadin:  Yes Ventricular Pacing:  100%  Parameters Mode:  SSIR     Lower  Rate Limit:  60     Upper Rate Limit:  130 Next Cardiology Appt Due:  09/25/2009 Tech Comments:  No parameter changes.   Device function normal.  TTM's with Mednet.  ROV 6 months in the [facility] clinic. Alma Friendly, LPN  February 28, 624THL 11:14 AM  MD Comments:  Agree with above.  Impression & Recommendations:  Problem # 1:  CARDIAC PACEMAKER IN SITU (ICD-V45.01) Her device is working normally. Will recheck in several months.  Problem # 2:  CARDIOMYOPATHY (ICD-425.4) Despite her LV dysfunction, she has no CHF symptoms. She will continue her current medications. Her updated medication list for this problem includes:    Spironolactone 50 Mg Tabs (Spironolactone) ..... One half by mouth daily    Coreg 12.5 Mg Tabs (Carvedilol) ..... One by mouth two times a day    Furosemide 40 Mg Tabs (Furosemide) ..... One and one half by mouth daily    Coumadin 5 Mg Tabs (Warfarin sodium) ..... By mouth as directed    Benazepril Hcl 20 Mg Tabs (Benazepril hcl) ..... One by mouth daily    Lanoxin 0.125 Mg Tabs (Digoxin) ..... One by mouth daily  Problem # 3:  ATRIAL FIBRILLATION, CHRONIC (ICD-427.31) Her ventricular rate is well controlled.  She will continue her current meds. Her updated medication list for this problem includes:    Coreg 12.5 Mg Tabs (Carvedilol) ..... One by mouth two times a day    Coumadin 5 Mg Tabs (Warfarin sodium) ..... By mouth as directed    Lanoxin 0.125 Mg Tabs (Digoxin) ..... One by mouth daily

## 2010-03-27 NOTE — Cardiovascular Report (Signed)
Summary: TTM   TTM   Imported By: Sallee Provencal 09/26/2009 14:35:36  _____________________________________________________________________  External Attachment:    Type:   Image     Comment:   External Document

## 2010-03-27 NOTE — Miscellaneous (Signed)
Summary: LABS CBCD,DIGOXIN 01/02/2009  Clinical Lists Changes  Observations: Added new observation of PLATELETK/UL: 234 K/uL (01/02/2009 16:56) Added new observation of MCV: 82.0 fL (01/02/2009 16:56) Added new observation of HCT: 37.0 % (01/02/2009 16:56) Added new observation of HGB: 11.2 g/dL (01/02/2009 16:56) Added new observation of WBC COUNT: 6.4 10*3/microliter (01/02/2009 16:56)

## 2010-03-27 NOTE — Medication Information (Signed)
Summary: ccr-lr  Anticoagulant Therapy  Managed by: Edrick Oh, RN Referring MD: [name] PCP: Dr.Fanta Supervising MD: Lattie Haw MD, Herbie Baltimore Indication 1: Atrial Fibrillation (ICD-427.31) Lab Used: [facility] [place] Site: [place] INR POC 2.6  Dietary changes: no    Health status changes: no    Bleeding/hemorrhagic complications: no    Recent/future hospitalizations: no    Any changes in medication regimen? no    Recent/future dental: no  Any missed doses?: no       Is patient compliant with meds? yes       Allergies: No Known Drug Allergies  Anticoagulation Management History:      The patient is taking warfarin and comes in today for a routine follow up visit.  Positive risk factors for bleeding include presence of serious comorbidities.  Negative risk factors for bleeding include an age less than 49 years old.  The bleeding index is 'intermediate risk'.  Positive CHADS2 values include History of Diabetes.  Negative CHADS2 values include Age > 45 years old.  The start date was 12/16/1998.  Anticoagulation responsible provider: Lattie Haw MD, Herbie Baltimore.  INR POC: 2.6.  Cuvette Lot#: [id number].  Exp: 10/11.    Anticoagulation Management Assessment/Plan:      The patient's current anticoagulation dose is Coumadin 5 mg tabs: by mouth as directed.  The target INR is 2 - 3.  The next INR is due 09/04/2009.  Anticoagulation instructions were given to patient.  Results were reviewed/authorized by Edrick Oh, RN.  She was notified by Edrick Oh RN.         Prior Anticoagulation Instructions: INR 3.2 Decrease coumadin to 2.5mg  once daily except 5mg  on Sundays, Tuesdays and Thursdays  Current Anticoagulation Instructions: INR 2.6 Continue coumadin 2.5mg  once daily except 5mg  on Sundays, Thuesdays and Thursdays

## 2010-03-27 NOTE — Medication Information (Signed)
Summary: ccr at Dr Lovena Le appt-lr  Anticoagulant Therapy  Managed by: Edrick Oh, RN Referring MD: [name] Supervising MD: Lattie Haw MD, Herbie [place] Indication 1: Atrial Fibrillation (ICD-427.31) Lab Used: [facility] [place] Site: [place] INR POC 1.7  Dietary changes: no    Health status changes: no    Bleeding/hemorrhagic complications: no    Recent/future hospitalizations: no    Any changes in medication regimen? no    Recent/future dental: no  Any missed doses?: no       Is patient compliant with meds? yes       Allergies: No Known Drug Allergies  Anticoagulation Management History:      The patient is taking warfarin and comes in today for a routine follow up visit.  Positive risk factors for bleeding include presence of serious comorbidities.  Negative risk factors for bleeding include an age less than 69 years old.  The bleeding index is 'intermediate risk'.  Positive CHADS2 values include History of Diabetes.  Negative CHADS2 values include Age > 73 years old.  The start date was 12/16/1998.  Anticoagulation responsible provider: Lattie Haw MD, Herbie Baltimore.  INR POC: 1.7.  Cuvette Lot#: [id number].  Exp: 10/11.    Anticoagulation Management Assessment/Plan:      The patient's current anticoagulation dose is Coumadin 5 mg tabs: by mouth as directed.  The target INR is 2 - 3.  The next INR is due 05/18/2009.  Anticoagulation instructions were given to patient.  Results were reviewed/authorized by Edrick Oh, RN.  She was notified by Edrick Oh RN.         Prior Anticoagulation Instructions: INR 1.8 Take coumadin 1 tablet tonight then resume 1/2 tablet once daily except 1 tablet on Mondays and Fridays  Current Anticoagulation Instructions: INR 1.7 Take coumadin 1 1/2 tablets tonight then increase dose to 1 tablet once daily except 1/2 tablet on Tuesdays, Thursdays and Saturdays

## 2010-03-27 NOTE — Letter (Signed)
Summary: Appointment - Missed  [facility] [place]. [address], [place] [address]   Phone: [phone]  Fax: [phone]     June 06, 2009 MRN: [id number]   [name] [address] [place], [place]  [address]   Dear Ms. [facility],  Our records indicate you missed your appointment on     06/05/09    with [facility]    It is very important that we reach you to reschedule this appointment. We look forward to participating in your health care needs. Please contact us at the number listed above at your earliest convenience to reschedule this appointment.     Sincerely,    Public relations account executive

## 2010-03-27 NOTE — Progress Notes (Signed)
Summary: RX REFILL  Medications Added LIPITOR 10 MG TABS (ATORVASTATIN CALCIUM) one half by mouth daily       Phone Note Call from Patient Call back at [facility] Phone [phone]   Caller: PT Reason for Call: Refill Medication Summary of Call: S: PT CALLED PHARMACY TO GET LIPITOR REFILLED AND WAS TOLD TO CALL us. [facility] Initial call taken by: Nevada Crane,  June 23, 2009 11:32 AM  Follow-up for Phone Call        [facility] that RX is at [facility] ready to pick up. Follow-up by: Jeani Hawking Via LPN,  April 29, 624THL 1:04 PM    New/Updated Medications: LIPITOR 10 MG TABS (ATORVASTATIN CALCIUM) one half by mouth daily Prescriptions: LIPITOR 10 MG TABS (ATORVASTATIN CALCIUM) one half by mouth daily  #15 x 0   Entered by:   Jeani Hawking Via LPN   Authorized by:   Yehuda Savannah, MD, [facility]   Signed by:   Jeani Hawking Via LPN on [id number]   Method used:   Electronically to        [facility] (retail)       [address]       [place], [place]  [address]       Ph: [phone]       Fax: [phone]   RxID:   [id number]

## 2010-03-27 NOTE — Medication Information (Signed)
Summary: CCR  Anticoagulant Therapy  Managed by: Edrick Oh, RN Referring MD: [name] PCP: Dr.Fanta Supervising MD: Johnsie Cancel MD, Collier Salina Indication 1: Atrial Fibrillation (ICD-427.31) Lab Used: [facility] [place] Site: [place] INR POC 2.8  Dietary changes: no    Health status changes: no    Bleeding/hemorrhagic complications: no    Recent/future hospitalizations: no    Any changes in medication regimen? no    Recent/future dental: no  Any missed doses?: no       Is patient compliant with meds? yes       Allergies: No Known Drug Allergies  Anticoagulation Management History:      The patient is taking warfarin and comes in today for a routine follow up visit.  Positive risk factors for bleeding include presence of serious comorbidities.  Negative risk factors for bleeding include an age less than 61 years old.  The bleeding index is 'intermediate risk'.  Positive CHADS2 values include History of Diabetes.  Negative CHADS2 values include Age > 79 years old.  The start date was 12/16/1998.  Anticoagulation responsible Fate Caster: Johnsie Cancel MD, Collier Salina.  INR POC: 2.8.  Cuvette Lot#: [id number].  Exp: 10/11.    Anticoagulation Management Assessment/Plan:      The patient's current anticoagulation dose is Coumadin 5 mg tabs: by mouth as directed.  The target INR is 2 - 3.  The next INR is due 02/28/2010.  Anticoagulation instructions were given to patient.  Results were reviewed/authorized by Edrick Oh, RN.  She was notified by Edrick Oh RN.         Prior Anticoagulation Instructions: INR 2.2 Continue coumadin 2.5mg  once daily except 5mg  on Sundays, Tuesdays and Thursdays  Current Anticoagulation Instructions: INR 2.8 Continue coumadin 2.5mg  once daily except 5mg  on Sundays, Tuesdays and Thursdays

## 2010-03-27 NOTE — Progress Notes (Signed)
Summary: Needs letter for [employer] duty   Phone Note Call from Patient   Caller: Patient Summary of Call: pt needs letter stating that she can not sit through [employer] duty / needs it faxed to [phone] / needs to be on  letterhead / tg Initial call taken by: Alphonsus Sias [facility],  Jul 20, 2009 9:19 AM  Follow-up for Phone Call        [place] Follow-up by: Tye Savoy RN,  Jul 20, 2009 9:52 AM

## 2010-03-27 NOTE — Cardiovascular Report (Signed)
Summary: TTM   TTM   Imported By: Sallee Provencal 07/20/2009 11:37:35  _____________________________________________________________________  External Attachment:    Type:   Image     Comment:   External Document

## 2010-03-27 NOTE — Progress Notes (Signed)
Summary: coreg refill pt is out   Phone Note Call from Patient Call back at [facility] Phone [phone] Call back at [phone]   Caller: pt Reason for Call: Refill Medication Summary of Call: coreg 12 mg twice a day needs called in to [employer]. Initial call taken by: Nevada Crane,  August 29, 2009 1:26 PM    Prescriptions: COREG 12.5 MG TABS (CARVEDILOL) one by mouth two times a day  #60 x 6   Entered by:   Tye Savoy RN   Authorized by:   Yehuda Savannah, MD, [facility]   Signed by:   Tye Savoy RN on 08/29/2009   Method used:   Electronically to        [facility]* (retail)       [address], [place]  [address]       Ph: [phone]       Fax: [phone]   RxID:   [id number]

## 2010-03-27 NOTE — Letter (Signed)
Summary: DRUG REQUEST FORM  DRUG REQUEST FORM   Imported By: Nevada Crane 01/04/2010 11:17:48  _____________________________________________________________________  External Attachment:    Type:   Image     Comment:   External Document

## 2010-03-27 NOTE — Medication Information (Signed)
Summary: CCR  Anticoagulant Therapy  Managed by: Edrick Oh, RN Referring MD: [name] PCP: Dr.Fanta Supervising MD: Lattie Haw MD, Herbie Baltimore Indication 1: Atrial Fibrillation (ICD-427.31) Lab Used: [facility] [place] Site: [place] INR POC 2.5  Dietary changes: no    Health status changes: no    Bleeding/hemorrhagic complications: no    Recent/future hospitalizations: no    Any changes in medication regimen? no    Recent/future dental: no  Any missed doses?: no       Is patient compliant with meds? yes       Allergies: No Known Drug Allergies  Anticoagulation Management History:      The patient is taking warfarin and comes in today for a routine follow up visit.  Positive risk factors for bleeding include presence of serious comorbidities.  Negative risk factors for bleeding include an age less than 51 years old.  The bleeding index is 'intermediate risk'.  Positive CHADS2 values include History of Diabetes.  Negative CHADS2 values include Age > 34 years old.  The start date was 12/16/1998.  Anticoagulation responsible provider: Lattie Haw MD, Herbie Baltimore.  INR POC: 2.5.  Cuvette Lot#: [id number].  Exp: 10/11.    Anticoagulation Management Assessment/Plan:      The patient's current anticoagulation dose is Coumadin 5 mg tabs: by mouth as directed.  The target INR is 2 - 3.  The next INR is due 10/18/2009.  Anticoagulation instructions were given to patient.  Results were reviewed/authorized by Edrick Oh, RN.  She was notified by Edrick Oh RN.         Prior Anticoagulation Instructions: INR 2.6 Continue coumadin 2.5mg  once daily except 5mg  on Sundays, Thuesdays and Thursdays  Current Anticoagulation Instructions: INR 2.5 Continue coumadin 2.5mg  once daily except 5mg  on Sundays, Tuesdays and Thursdays

## 2010-03-27 NOTE — Medication Information (Signed)
Summary: PROTIME/TG  Anticoagulant Therapy  Managed by: Edrick Oh, RN Referring MD: [name] Supervising MD: Lattie Haw MD, Herbie Baltimore Indication 1: Atrial Fibrillation (ICD-427.31) Lab Used: [facility] [place] Site: [place] INR POC 1.8  Dietary changes: no    Health status changes: no    Bleeding/hemorrhagic complications: no    Recent/future hospitalizations: no    Any changes in medication regimen? no    Recent/future dental: no  Any missed doses?: no       Is patient compliant with meds? yes       Allergies: No Known Drug Allergies  Anticoagulation Management History:      The patient is taking warfarin and comes in today for a routine follow up visit.  Positive risk factors for bleeding include presence of serious comorbidities.  Negative risk factors for bleeding include an age less than 3 years old.  The bleeding index is 'intermediate risk'.  Positive CHADS2 values include History of Diabetes.  Negative CHADS2 values include Age > 58 years old.  The start date was 12/16/1998.  Anticoagulation responsible provider: Lattie Haw MD, Herbie Baltimore.  INR POC: 1.8.  Cuvette Lot#: [id number].  Exp: 10/11.    Anticoagulation Management Assessment/Plan:      The patient's current anticoagulation dose is Coumadin 5 mg tabs: by mouth as directed.  The target INR is 2 - 3.  The next INR is due 03/29/2009.  Anticoagulation instructions were given to patient.  Results were reviewed/authorized by Edrick Oh, RN.  She was notified by Edrick Oh RN.         Prior Anticoagulation Instructions: INR 2.4 Continue coumadin 2.5mg  once daily except 5mg  on Mondays and Fridays  Current Anticoagulation Instructions: INR 1.8 Take coumadin 1 tablet tonight then resume 1/2 tablet once daily except 1 tablet on Mondays and Fridays

## 2010-03-27 NOTE — Letter (Signed)
Summary: Energy manager at [facility]. [address],  [address]   Phone: [phone]  Fax: [phone]    Jul 20, 2009  [place]  [place], [place] [address]  [id number]  Juror: #______   Dear Lessie Dings:   Shirl Harris of [place], [place] has just informed me that she has been chosen to serve on jury duty.  Nixie is a patient under my care with the diagnosis ofcomplete congential heart block,cardiac pacemaker, chronic kidney disease stage III, chronic atrial fibrillation, cardiomyopathy and diabetes. I do not feel that she should serve on the jury. I would like to request that she be excused from jury duty permanently.  Your consideration of this matter is greatly appreciated.  Respectfully,      Dr. [name]

## 2010-03-27 NOTE — Procedures (Signed)
Summary: pacer check per paula/jml      Allergies Added: NKDA  Current Medications (verified): 1)  Spironolactone 50 Mg Tabs (Spironolactone) .... One Half By Mouth Daily 2)  Klor-Con 20 Meq Pack (Potassium Chloride) .... One By Mouth Daily 3)  Coreg 12.5 Mg Tabs (Carvedilol) .... One By Mouth Two Times A Day 4)  Furosemide 40 Mg Tabs (Furosemide) .... One and One Half By Mouth Daily 5)  Tandem 162-115.2 Mg Caps (Ferrous Fum-Iron Polysacch) .... One By Mouth Daily 6)  Coumadin 5 Mg Tabs (Warfarin Sodium) .... By Mouth As Directed 7)  Benazepril Hcl 20 Mg Tabs (Benazepril Hcl) .... One By Mouth Daily 8)  Lipitor 10 Mg Tabs (Atorvastatin Calcium) .... One Half By Mouth Daily 9)  Lanoxin 0.125 Mg Tabs (Digoxin) .... One By Mouth Daily 10)  Novolog Flexpen 100 Unit/ml Soln (Insulin Aspart) .... 8 Am,14u At Lunch,18 Supper Time 11)  Lantus For Opticlik 100 Unit/ml Soln (Insulin Glargine) .... 32 Units Am 28 U Bedtime 12)  Humulin N 100 Unit/ml Susp (Insulin Isophane Human) .... 36 U At Bedtime 13)  Allopurinol 100 Mg Tabs (Allopurinol) .... Take 1 Tab Daily 14)  Calcium-Vitamin D 250-125 Mg-Unit Tabs (Calcium Carbonate-Vitamin D) .... Take 1 Cap Two Times A Day  Allergies (verified): No Known Drug Allergies   PPM Specifications Following MD:  [name], MD     PPM Vendor:  [facility] [employer]     PPM Model Number:  [id number]     PPM Serial Number:  [id number] PPM DOI:  01/31/2004      Lead 1    Location: RV     DOI: 10/05/1997     Model #: [id number]     Serial #: [id number]     Status: active  Magnet Response Rate:  BOL 100 ERI  85  Indications:  CHB;A-fib +coumadin  Explantation Comments:  TTM's with Mednet Pacemaker dependent  PPM Follow Up Remote Check?  No Battery Voltage:  good V     Battery Est. Longevity:  4.5 years     Pacer Dependent:  Yes     Right Ventricle  Impedance: 350 ohms, Threshold: 0.9 V at 0.2 msec  Episodes Coumadin:  Yes Ventricular Pacing:  100%  Parameters Mode:   SSIR     Lower Rate Limit:  60     Upper Rate Limit:  130 Next Cardiology Appt Due:  03/28/2010 Tech Comments:  No parameter changes.  Device function normal.  ROV 6 months with Dr. Lovena Le. Alma Friendly, LPN  August 11, 624THL 3:58 PM

## 2010-03-27 NOTE — Medication Information (Signed)
Summary: protime/tg  Anticoagulant Therapy  Managed by: Edrick Oh, RN Referring MD: [name] PCP: Dr.Fanta Supervising MD: Lattie Haw MD, Herbie Baltimore Indication 1: Atrial Fibrillation (ICD-427.31) Lab Used: [facility] [place] Site: [place] INR POC 2.2  Dietary changes: no    Health status changes: no    Bleeding/hemorrhagic complications: no    Recent/future hospitalizations: no    Any changes in medication regimen? no    Recent/future dental: no  Any missed doses?: no       Is patient compliant with meds? yes       Allergies: No Known Drug Allergies  Anticoagulation Management History:      The patient is taking warfarin and comes in today for a routine follow up visit.  Positive risk factors for bleeding include presence of serious comorbidities.  Negative risk factors for bleeding include an age less than 47 years old.  The bleeding index is 'intermediate risk'.  Positive CHADS2 values include History of Diabetes.  Negative CHADS2 values include Age > 57 years old.  The start date was 12/16/1998.  Anticoagulation responsible provider: Lattie Haw MD, Herbie Baltimore.  INR POC: 2.2.  Exp: 10/11.    Anticoagulation Management Assessment/Plan:      The patient's current anticoagulation dose is Coumadin 5 mg tabs: by mouth as directed.  The target INR is 2 - 3.  The next INR is due 01/24/2010.  Anticoagulation instructions were given to patient.  Results were reviewed/authorized by Edrick Oh, RN.  She was notified by Edrick Oh RN.         Prior Anticoagulation Instructions: INR 2.2 Continue coumadin 2.5mg  once daily except 5mg  on Sundays, Tuesdays and Thursdays  Current Anticoagulation Instructions: Same as Prior Instructions.

## 2010-03-27 NOTE — Medication Information (Signed)
Summary: CCR  Anticoagulant Therapy  Managed by: Edrick Oh, RN Referring MD: [name] PCP: Dr.Fanta Supervising MD: Lattie Haw MD, Herbie Baltimore Indication 1: Atrial Fibrillation (ICD-427.31) Lab Used: [facility] [place] Site: [place] INR POC 4.0  Dietary changes: no    Health status changes: no    Bleeding/hemorrhagic complications: no    Recent/future hospitalizations: no    Any changes in medication regimen? no    Recent/future dental: no  Any missed doses?: no       Is patient compliant with meds? yes       Allergies: No Known Drug Allergies  Anticoagulation Management History:      The patient is taking warfarin and comes in today for a routine follow up visit.  Positive risk factors for bleeding include presence of serious comorbidities.  Negative risk factors for bleeding include an age less than 23 years old.  The bleeding index is 'intermediate risk'.  Positive CHADS2 values include History of Diabetes.  Negative CHADS2 values include Age > 61 years old.  The start date was 12/16/1998.  Anticoagulation responsible provider: Lattie Haw MD, Herbie Baltimore.  INR POC: 4.0.  Cuvette Lot#: [id number].  Exp: 10/11.    Anticoagulation Management Assessment/Plan:      The patient's current anticoagulation dose is Coumadin 5 mg tabs: by mouth as directed.  The target INR is 2 - 3.  The next INR is due 06/26/2009.  Anticoagulation instructions were given to patient.  Results were reviewed/authorized by Edrick Oh, RN.  She was notified by Edrick Oh RN.         Prior Anticoagulation Instructions: INR 1.8 Take coumadin 1 tablet tonight then increase dose to 5mg  once daily except 2.5mg  on Tuesdays and Thursdays  Current Anticoagulation Instructions: INR 4.0 Hold coumadin tonight then decrease dose to 5mg  once daily except 2.5mg  on Mondays, Wednesdays and Fridays

## 2010-03-27 NOTE — Miscellaneous (Signed)
**Note De-Identified Trulee Hamstra Obfuscation** Summary: BMP,HGB A1c and Lipids (04-18-2009)  Clinical Lists Changes  Observations: Added new observation of ALBUMIN: 4.2 g/dL (04/18/2009 10:34) Added new observation of ALK PHOS: 3.6 units/L (04/18/2009 10:34) Added new observation of CREATININE: 1.8 mg/dL (04/18/2009 10:34) Added new observation of BUN: 49 mg/dL (04/18/2009 10:34) Added new observation of BG RANDOM: 143 mg/dL (04/18/2009 10:34) Added new observation of K SERUM: 4.5 meq/L (04/18/2009 10:34) Added new observation of NA: 133 meq/L (04/18/2009 10:34) Added new observation of LDL: 30 mg/dL (04/18/2009 10:34) Added new observation of HDL: 31 mg/dL (04/18/2009 10:34) Added new observation of TRIGLYC TOT: 149 mg/dL (04/18/2009 10:34) Added new observation of CHOLESTEROL: 122 mg/dL (04/18/2009 10:34) Added new observation of HGBA1C: 9.1 % (04/18/2009 10:34)

## 2010-03-27 NOTE — Medication Information (Signed)
Summary: PROTIME/TG  Anticoagulant Therapy  Managed by: Edrick Oh, RN Referring MD: [name] PCP: Dr.Fanta Supervising MD: Lattie Haw MD, Herbie Baltimore Indication 1: Atrial Fibrillation (ICD-427.31) Lab Used: [facility] [place] Site: [place] INR POC 3.2  Dietary changes: no    Health status changes: no    Bleeding/hemorrhagic complications: no    Recent/future hospitalizations: no    Any changes in medication regimen? no    Recent/future dental: no  Any missed doses?: no       Is patient compliant with meds? yes       Allergies: No Known Drug Allergies  Anticoagulation Management History:      The patient is taking warfarin and comes in today for a routine follow up visit.  Positive risk factors for bleeding include presence of serious comorbidities.  Negative risk factors for bleeding include an age less than 25 years old.  The bleeding index is 'intermediate risk'.  Positive CHADS2 values include History of Diabetes.  Negative CHADS2 values include Age > 3 years old.  The start date was 12/16/1998.  Anticoagulation responsible provider: Lattie Haw MD, Herbie Baltimore.  INR POC: 3.2.  Cuvette Lot#: [id number].  Exp: 10/11.    Anticoagulation Management Assessment/Plan:      The patient's current anticoagulation dose is Coumadin 5 mg tabs: by mouth as directed.  The target INR is 2 - 3.  The next INR is due 08/07/2009.  Anticoagulation instructions were given to patient.  Results were reviewed/authorized by Edrick Oh, RN.  She was notified by Edrick Oh RN.         Prior Anticoagulation Instructions: INR 4.0 Hold coumadin tonight then decrease dose to 5mg  once daily except 2.5mg  on Mondays, Wednesdays and Fridays  Current Anticoagulation Instructions: INR 3.2 Decrease coumadin to 2.5mg  once daily except 5mg  on Sundays, Tuesdays and Thursdays

## 2010-03-27 NOTE — Cardiovascular Report (Signed)
Summary: Office Visit   Office Visit   Imported By: Sallee Provencal 04/27/2009 14:10:15  _____________________________________________________________________  External Attachment:    Type:   Image     Comment:   External Document

## 2010-03-27 NOTE — Letter (Signed)
Summary: Appointment - Missed  [facility] [place]. [address] [place] [address]   Phone: [phone]  Fax: [phone]     January 24, 2010 MRN: [id number]   DEMAYA JANOFF [address] [place], [place]  [address]   Dear Ms. [facility],  Our records indicate you missed your appointment on         01/24/10 COUMADIN CLINIC                 It is very important that we reach you to reschedule this appointment. We look forward to participating in your health care needs. Please contact us at the number listed above at your earliest convenience to reschedule this appointment.     Sincerely,    Public relations account executive

## 2010-03-27 NOTE — Cardiovascular Report (Signed)
Summary: TTM   TTM   Imported By: Sallee Provencal 01/09/2010 16:08:56  _____________________________________________________________________  External Attachment:    Type:   Image     Comment:   External Document

## 2010-03-28 ENCOUNTER — Ambulatory Visit: Admit: 2010-03-28 | Payer: Self-pay

## 2010-03-29 NOTE — Medication Information (Signed)
Summary: ccr-lr  Anticoagulant Therapy  Managed by: Edrick Oh, RN Referring MD: [name] PCP: Dr.Fanta Supervising MD: Lattie Haw MD, Herbie Baltimore Indication 1: Atrial Fibrillation (ICD-427.31) Lab Used: [facility] [place] Site: [place] INR POC 1.9  Dietary changes: no    Health status changes: no    Bleeding/hemorrhagic complications: no    Recent/future hospitalizations: no    Any changes in medication regimen? no    Recent/future dental: no  Any missed doses?: no       Is patient compliant with meds? yes       Allergies: No Known Drug Allergies  Anticoagulation Management History:      The patient is taking warfarin and comes in today for a routine follow up visit.  Positive risk factors for bleeding include presence of serious comorbidities.  Negative risk factors for bleeding include an age less than 21 years old.  The bleeding index is 'intermediate risk'.  Positive CHADS2 values include History of Diabetes.  Negative CHADS2 values include Age > 103 years old.  The start date was 12/16/1998.  Anticoagulation responsible provider: Lattie Haw MD, Herbie Baltimore.  INR POC: 1.9.  Cuvette Lot#: [id number].  Exp: 10/11.    Anticoagulation Management Assessment/Plan:      The patient's current anticoagulation dose is Coumadin 5 mg tabs: by mouth as directed.  The target INR is 2 - 3.  The next INR is due 03/28/2010.  Anticoagulation instructions were given to patient.  Results were reviewed/authorized by Edrick Oh, RN.  She was notified by Edrick Oh RN.         Prior Anticoagulation Instructions: INR 2.8 Continue coumadin 2.5mg  once daily except 5mg  on Sundays, Tuesdays and Thursdays  Current Anticoagulation Instructions: INR 1.9 Take coumadin 1 tablet tonight then resume 1/2 tablet once daily except 1 tablet on Sundays, Tuesdays and Thursdays

## 2010-03-31 ENCOUNTER — Encounter: Payer: Self-pay | Admitting: Obstetrics and Gynecology

## 2010-04-04 ENCOUNTER — Encounter: Payer: Self-pay | Admitting: Cardiology

## 2010-04-04 ENCOUNTER — Encounter (INDEPENDENT_AMBULATORY_CARE_PROVIDER_SITE_OTHER): Payer: Medicaid Other

## 2010-04-04 DIAGNOSIS — I4891 Unspecified atrial fibrillation: Secondary | ICD-10-CM

## 2010-04-04 DIAGNOSIS — Z7901 Long term (current) use of anticoagulants: Secondary | ICD-10-CM

## 2010-04-05 ENCOUNTER — Encounter (INDEPENDENT_AMBULATORY_CARE_PROVIDER_SITE_OTHER): Payer: Self-pay | Admitting: *Deleted

## 2010-04-11 ENCOUNTER — Other Ambulatory Visit: Payer: Self-pay | Admitting: Obstetrics and Gynecology

## 2010-04-11 DIAGNOSIS — Z139 Encounter for screening, unspecified: Secondary | ICD-10-CM

## 2010-04-12 NOTE — Letter (Signed)
Summary: Appointment - Missed  [facility] [place]. [address], [place] [address]   Phone: [phone]  Fax: [phone]     April 05, 2010 MRN: [id number]   [name] [address] [place],   [address]   Dear Ms. [facility],  Our records indicate you missed your appointment on   03/28/10                     with Coumadin clinic      .                                    It is very important that we reach you to reschedule this appointment. We look forward to participating in your health care needs. Please contact us at the number listed above at your earliest convenience to reschedule this appointment.     Sincerely,    Public relations account executive

## 2010-04-12 NOTE — Cardiovascular Report (Signed)
Summary: TTM   TTM   Imported By: Sallee Provencal 04/05/2010 14:56:49  _____________________________________________________________________  External Attachment:    Type:   Image     Comment:   External Document

## 2010-04-12 NOTE — Medication Information (Signed)
Summary: PROTIME/TG  Anticoagulant Therapy Managed by: Edrick Oh, RN Patient Assessment Part 2:  Have you MISSED ANY DOSES or CHANGED TABLETS?  10  Have you had any BRUISING or BLEEDING ( nose or gum bleeds,blood in urine or stool)?  Have you STARTED or STOPPED any MEDICATIONS, including OTC meds,herbals or supplements?  Have you CHANGED your DIET, especially green vegetables,or ALCOHOL intake?  Have you had any ILLNESSES or HOSPITALIZATIONS?  Have you had any signs of CLOTTING?(chest discomfort,dizziness,shortness of breath,arms tingling,slurred speech,swelling or redness in leg)      New  Tablet strength: : 5mg  Regimen Out:     Sunday: 1 tab Tablet     Monday: 0.5 tab Tablet     Tuesday: 1 tab Tablet     Wednesday: 0.5 tab Tablet     Thursday: 1 tab Tablet      Friday: 0.5 tab Tablet     Saturday: 1 tab Tablet Total Weekly: 27.50 mg mg  Next INR Due: 05/02/2010      Allergies: No Known Drug Allergies  Anticoagulant Therapy  Managed by: Edrick Oh, RN Referring MD: [name] PCP: Dr.Fanta Supervising MD: Lattie Haw MD, Herbie [place] Indication 1: Atrial Fibrillation (ICD-427.31) Lab Used: [facility] [place] Site: [place] INR POC 1.7  Dietary changes: no    Health status changes: no    Bleeding/hemorrhagic complications: no    Recent/future hospitalizations: no    Any changes in medication regimen? no    Recent/future dental: no  Any missed doses?: no       Is patient compliant with meds? yes         Anticoagulation Management History:      The patient is taking warfarin and comes in today for a routine follow up visit.  Positive risk factors for bleeding include presence of serious comorbidities.  Negative risk factors for bleeding include an age less than 16 years old.  The bleeding index is 'intermediate risk'.  Positive CHADS2 values include History of Diabetes.  Negative CHADS2 values include Age > 43 years old.   The start date was 12/16/1998.  Anticoagulation responsible provider: Lattie Haw MD, Herbie [place].  INR POC: 1.7.  Cuvette Lot#: [id number].  Exp: 10/11.    Anticoagulation Management Assessment/Plan:      The patient's current anticoagulation dose is Coumadin 5 mg tabs: by mouth as directed.  The target INR is 2 - 3.  The next INR is due 05/02/2010.  Anticoagulation instructions were given to patient.  Results were reviewed/authorized by Edrick Oh, RN.  She was notified by Edrick Oh RN.         Prior Anticoagulation Instructions: INR 1.9 Take coumadin 1 tablet tonight then resume 1/2 tablet once daily except 1 tablet on Sundays, Tuesdays and Thursdays  Current Anticoagulation Instructions: INR 1.7 Take coumadin 5mg  tonight then increase dose to 5mg  once daily except 2.5mg  on Mondays, Wednesdays and Fridays

## 2010-04-19 ENCOUNTER — Ambulatory Visit (HOSPITAL_COMMUNITY)
Admission: RE | Admit: 2010-04-19 | Discharge: 2010-04-19 | Disposition: A | Discharge status: Home / Self Care | Payer: Medicaid Other | Source: Ambulatory Visit | Attending: Obstetrics and Gynecology | Admitting: Obstetrics and Gynecology

## 2010-04-19 DIAGNOSIS — Z1231 Encounter for screening mammogram for malignant neoplasm of breast: Secondary | ICD-10-CM | POA: Insufficient documentation

## 2010-04-19 DIAGNOSIS — Z139 Encounter for screening, unspecified: Secondary | ICD-10-CM

## 2010-05-02 ENCOUNTER — Encounter (INDEPENDENT_AMBULATORY_CARE_PROVIDER_SITE_OTHER): Payer: Medicaid Other

## 2010-05-02 ENCOUNTER — Encounter: Payer: Self-pay | Admitting: Cardiovascular Disease

## 2010-05-02 DIAGNOSIS — Z7901 Long term (current) use of anticoagulants: Secondary | ICD-10-CM

## 2010-05-02 DIAGNOSIS — I4891 Unspecified atrial fibrillation: Secondary | ICD-10-CM

## 2010-05-02 LAB — CONVERTED CEMR LAB: POC INR: 2.5

## 2010-05-08 ENCOUNTER — Encounter: Payer: Self-pay | Admitting: Cardiology

## 2010-05-08 NOTE — Medication Information (Signed)
Summary: ccr-lr  Anticoagulant Therapy  Managed by: Edrick Oh, RN Referring MD: [name] PCP: Dr.Fanta Supervising MD: Johnsie Cancel MD, Collier [place]ina Indication 1: Atrial Fibrillation (ICD-427.31) Lab Used: [facility] [place] Site:  INR POC 2.5  Dietary changes: no    Health status changes: no    Bleeding/hemorrhagic complications: no    Recent/future hospitalizations: no    Any changes in medication regimen? no    Recent/future dental: no  Any missed doses?: no       Is patient compliant with meds? yes       Allergies: No Known Drug Allergies  Anticoagulation Management History:      Her anticoagulation is being managed by telephone today.  Positive risk factors for bleeding include presence of serious comorbidities.  Negative risk factors for bleeding include an age less than 82 years old.  The bleeding index is 'intermediate risk'.  Positive CHADS2 values include History of Diabetes.  Negative CHADS2 values include Age > 34 years old.  The start date was 12/16/1998.  Anticoagulation responsible provider: Johnsie Cancel MD, Collier Salina.  INR POC: 2.5.  Cuvette Lot#: [id number].  Exp: 10/11.    Anticoagulation Management Assessment/Plan:      The patient's current anticoagulation dose is Coumadin 5 mg tabs: by mouth as directed.  The target INR is 2 - 3.  The next INR is due 05/30/2010.  Anticoagulation instructions were given to patient.  Results were reviewed/authorized by Edrick Oh, RN.  She was notified by Edrick Oh RN.         Prior Anticoagulation Instructions: INR 1.7 Take coumadin 5mg  tonight then increase dose to 5mg  once daily except 2.5mg  on Mondays, Wednesdays and Fridays  Current Anticoagulation Instructions: INR 2.5 Continue coumadin 5mg  once daily except 2.5mg  on Mondays, Wednesdays and Fridays

## 2010-05-15 NOTE — Letter (Signed)
Summary: RX HAND SIGNED  RX HAND SIGNED   Imported By: Nevada Crane 05/08/2010 14:06:56  _____________________________________________________________________  External Attachment:    Type:   Image     Comment:   External Document

## 2010-05-18 ENCOUNTER — Encounter: Payer: Self-pay | Admitting: Internal Medicine

## 2010-05-23 ENCOUNTER — Encounter: Payer: Self-pay | Admitting: Cardiology

## 2010-05-23 DIAGNOSIS — Z7901 Long term (current) use of anticoagulants: Secondary | ICD-10-CM

## 2010-05-23 DIAGNOSIS — I4891 Unspecified atrial fibrillation: Secondary | ICD-10-CM

## 2010-05-29 ENCOUNTER — Encounter: Payer: Self-pay | Admitting: Internal Medicine

## 2010-05-30 ENCOUNTER — Encounter: Payer: Medicaid Other | Admitting: *Deleted

## 2010-05-30 ENCOUNTER — Encounter: Payer: Self-pay | Admitting: *Deleted

## 2010-06-06 ENCOUNTER — Ambulatory Visit (INDEPENDENT_AMBULATORY_CARE_PROVIDER_SITE_OTHER): Payer: Medicaid Other | Admitting: Cardiology

## 2010-06-06 ENCOUNTER — Encounter: Payer: Self-pay | Admitting: Cardiology

## 2010-06-06 ENCOUNTER — Ambulatory Visit (INDEPENDENT_AMBULATORY_CARE_PROVIDER_SITE_OTHER): Payer: Medicaid Other | Admitting: *Deleted

## 2010-06-06 DIAGNOSIS — Z950 Presence of cardiac pacemaker: Secondary | ICD-10-CM

## 2010-06-06 DIAGNOSIS — E119 Type 2 diabetes mellitus without complications: Secondary | ICD-10-CM

## 2010-06-06 DIAGNOSIS — Q246 Congenital heart block: Secondary | ICD-10-CM | POA: Insufficient documentation

## 2010-06-06 DIAGNOSIS — I639 Cerebral infarction, unspecified: Secondary | ICD-10-CM | POA: Insufficient documentation

## 2010-06-06 DIAGNOSIS — I4891 Unspecified atrial fibrillation: Secondary | ICD-10-CM

## 2010-06-06 DIAGNOSIS — I428 Other cardiomyopathies: Secondary | ICD-10-CM

## 2010-06-06 DIAGNOSIS — I6350 Cerebral infarction due to unspecified occlusion or stenosis of unspecified cerebral artery: Secondary | ICD-10-CM

## 2010-06-06 DIAGNOSIS — Z7901 Long term (current) use of anticoagulants: Secondary | ICD-10-CM

## 2010-06-06 LAB — POCT INR: INR: 2.9

## 2010-06-06 NOTE — Assessment & Plan Note (Signed)
Upcoming appointment with Dr. Lovena Le will be verified for complete reassessment of her single-chamber pacemaker.

## 2010-06-06 NOTE — Assessment & Plan Note (Addendum)
CHF is compensated with current medical regime, which will be continued.  We will monitor electrolytes and renal function-a metabolic profile is pending.

## 2010-06-06 NOTE — Patient Instructions (Addendum)
Your physician recommends that you schedule a follow-up appointment in  1 year    Your physician recommends that you return for lab work [id number] Verify EP appt with Dr. Lovena Le Stool card x3 follow the instructions in the packet

## 2010-06-06 NOTE — Progress Notes (Signed)
HPI :   [name] returns to the office as scheduled, reported excellent quality of life with little in the way of cardiopulmonary symptoms.  She has gradually gained a substantial amount of weight over the years, but reports consuming a high quality diet with little fat or sugar.  She remains active without chest discomfort, dyspnea or syncope.  She just celebrated her 66st birthday with plans for a colonoscopy in near future.  She has not been seen in person by our electrophysiology service for more than one year, but has an upcoming appointment.  Current Outpatient Prescriptions on File Prior to Visit  Medication Sig Dispense Refill  . allopurinol (ZYLOPRIM) 100 MG tablet Take 100 mg by mouth daily.        . benazepril (LOTENSIN) 20 MG tablet Take 20 mg by mouth daily.        . calcium-vitamin D (OSCAL) 250-125 MG-UNIT per tablet Take 1 tablet by mouth daily.       . carvedilol (COREG) 12.5 MG tablet Take 12.5 mg by mouth 2 (two) times daily with a meal.        . digoxin (LANOXIN) 0.125 MG tablet Take 125 mcg by mouth daily.        . furosemide (LASIX) 40 MG tablet Take 40 mg by mouth. Take 1 and 1/2 tablets po daily       . insulin aspart (NOVOLOG) 100 UNIT/ML injection Inject into the skin. Inject 12 units at 8:00am, 18 units at lunch and 22 units at suppertime      . insulin glargine (LANTUS) 100 UNIT/ML injection Inject into the skin. Inject40 units in am and30 units at bedtime      . insulin NPH (HUMULIN N,NOVOLIN N) 100 UNIT/ML injection Inject 38 Units into the skin at bedtime.       . potassium chloride (KLOR-CON) 20 MEQ packet Take 20 mEq by mouth daily.        Marland Kitchen warfarin (COUMADIN) 5 MG tablet Take 5 mg by mouth daily. As directed by coumadin clinic       . DISCONTD: atorvastatin (LIPITOR) 10 MG tablet Take by mouth. Take 1/2 tablet @@ bedtime       . DISCONTD: ferrous fumarate-iron polysaccharide complex (TANDEM) 162-115.2 MG CAPS Take 1 capsule by mouth daily with breakfast.          . DISCONTD: spironolactone (ALDACTONE) 50 MG tablet Take 25 mg by mouth daily.           No Known Allergies    Past medical history, social history, and family history reviewed and updated.  ROS: See history of present illness.  PHYSICAL EXAM: BP 122/75  Pulse 68  Ht 5\' 5"  (1.651 m)  Wt 186 lb (84.369 kg)  BMI 30.95 kg/m2  SpO2 96%  General-Well developed; no acute distress Body habitus-overweight Neck-No JVD; no carotid bruits Lungs-clear lung fields; resonant to percussion Cardiovascular-normal PMI; normal S1 and S2; grade A999333 systolic ejection murmur at the left sternal border; normal intensity of P2 Abdomen-normal bowel sounds; soft and non-tender without masses or organomegaly Musculoskeletal-No deformities, no cyanosis or clubbing Neurologic-Normal cranial nerves; symmetric strength and tone Skin-Warm, no significant lesions Extremities-distal pulses intact; no edema; impressive varicosities scattered over both legs  ASSESSMENT AND PLAN:

## 2010-06-06 NOTE — Assessment & Plan Note (Addendum)
Patient reports good control of diabetes.  I explained to her that diabetes would likely improve if she loses a significant amount of weight.

## 2010-06-06 NOTE — Assessment & Plan Note (Signed)
Patient is pacemaker dependent with underlying atrial fibrillation, stable chronic anticoagulation and no thromboembolic events since adequate anticoagulation was established.

## 2010-06-28 ENCOUNTER — Encounter: Payer: Self-pay | Admitting: Internal Medicine

## 2010-06-28 DIAGNOSIS — I442 Atrioventricular block, complete: Secondary | ICD-10-CM

## 2010-07-04 ENCOUNTER — Encounter: Payer: Medicaid Other | Admitting: *Deleted

## 2010-07-04 ENCOUNTER — Encounter: Payer: Self-pay | Admitting: *Deleted

## 2010-07-05 ENCOUNTER — Encounter: Payer: Medicaid Other | Admitting: *Deleted

## 2010-07-06 ENCOUNTER — Encounter: Payer: Self-pay | Admitting: *Deleted

## 2010-07-07 LAB — CBC WITH DIFFERENTIAL/PLATELET
Basophils Absolute: 0 10*3/uL (ref 0.0–0.1)
Eosinophils Relative: 2 % (ref 0–5)
HCT: 38.9 % (ref 36.0–46.0)
Hemoglobin: 12.1 g/dL (ref 12.0–15.0)
Lymphocytes Relative: 30 % (ref 12–46)
MCV: 82.1 fL (ref 78.0–100.0)
Monocytes Absolute: 0.4 10*3/uL (ref 0.1–1.0)
Monocytes Relative: 5 % (ref 3–12)
RDW: 15.3 % (ref 11.5–15.5)
WBC: 7.6 10*3/uL (ref 4.0–10.5)

## 2010-07-07 LAB — COMPREHENSIVE METABOLIC PANEL
ALT: 31 U/L (ref 0–35)
AST: 27 U/L (ref 0–37)
BUN: 49 mg/dL — ABNORMAL HIGH (ref 6–23)
Creat: 2.29 mg/dL — ABNORMAL HIGH (ref 0.40–1.20)
Total Bilirubin: 0.8 mg/dL (ref 0.3–1.2)

## 2010-07-08 ENCOUNTER — Encounter: Payer: Self-pay | Admitting: Cardiology

## 2010-07-09 ENCOUNTER — Telehealth: Payer: Self-pay | Admitting: *Deleted

## 2010-07-09 ENCOUNTER — Encounter: Payer: Self-pay | Admitting: *Deleted

## 2010-07-09 DIAGNOSIS — Z79899 Other long term (current) drug therapy: Secondary | ICD-10-CM

## 2010-07-09 DIAGNOSIS — N289 Disorder of kidney and ureter, unspecified: Secondary | ICD-10-CM

## 2010-07-09 MED ORDER — LANOXIN 125 MCG PO TABS: 125.0000 ug | ORAL_TABLET | Freq: Every day | ORAL | Status: DC

## 2010-07-09 MED ORDER — DIGOXIN 125 MCG PO TABS: ORAL_TABLET | ORAL | Status: DC

## 2010-07-09 NOTE — Telephone Encounter (Signed)
Message copied by Tye Savoy on Mon Jul 09, 2010  2:37 PM ------      Message from: [name]      Created: Sun Jul 08, 2010 11:06 AM       Gradual progression in renal dysfunction      Send lab result to Drs. Jennette Dubin and [employer] in 4 and 8 mos.; digoxin level in 4 months      Decrease digoxin to 5 days per week

## 2010-07-09 NOTE — Telephone Encounter (Signed)
Discussed labs with pt, dx of renal dysfunction, medication decrease to 5 daily a week, labwork to be drawn in 4 and 8 months, faxed labs to all 3 doctors

## 2010-07-09 NOTE — Telephone Encounter (Signed)
Message copied by Tye Savoy on Mon Jul 09, 2010 12:57 PM ------      Message from: [name]      Created: Sun Jul 08, 2010 11:06 AM       Gradual progression in renal dysfunction      Send lab result to Drs. Jennette Dubin and [employer] in 4 and 8 mos.; digoxin level in 4 months      Decrease digoxin to 5 days per week

## 2010-07-10 NOTE — Assessment & Plan Note (Signed)
[place] OFFICE NOTE   TASHEKA, SOL                      MRN:          [id number]  DATE:01/06/2008                            DOB:          04-21-1960    Mr. Mcelderry returns today for followup after a long absence from our EP  clinic.  She is a very pleasant 50 year old woman with a history of  congenital complete heart block and atrial fibrillation, who underwent  permanent pacemaker insertion approximately 10 years ago.  She also  underwent pacemaker generator change approximately 4 years ago, as her  device had reached elective replacement indication.  She returns today  for followup.  She has had no heart failure symptoms.  Very remotely,  she initially had some heart failure.  This is all resolved and has not  been present for several years.  Her activities are unlimited.  She  denies peripheral edema.  She denies palpitations.   MEDICINES:  1. Lasix 60 a day.  2. Coumadin as directed.  3. Aldactone 25 a day.  4. Lanoxin 0.125 mg daily.  5. Potassium 20 a day.  6. Coreg 12.5 twice a day.  7. Lotensin 20 a day.  8. Insulin as directed.  9. Pravachol 20 a day.   PHYSICAL EXAMINATION:  GENERAL:  She is a pleasant well-appearing 84-  year-old woman in no distress.  VITAL SIGNS:  The blood pressure was 100/68, the pulse 64 and regular,  the respirations were 18, the weight was 203 pounds, up 13 pounds from  her visit back in May.  NECK:  No jugular venous distention.  LUNGS:  Clear bilaterally to auscultation.  No wheezes, rales, or  rhonchi are present.  CARDIOVASCULAR:  Regular rate and rhythm.  Normal S1 and S2.  There are  no murmurs, rubs, or gallops.  ABDOMEN:  Soft, nontender, and nondistended.  There is no organomegaly.  EXTREMITIES:  No cyanosis, clubbing, or edema.  Pulses are 2+ symmetric.   Interrogation of her pacemaker demonstrates a Murphy.  There are  no R-waves  secondary to pacemaker dependence.  The pacing impedance was  350 in the RV .  The threshold is 0.8 at 0.2 in the RV.  Battery voltage  was good at 75%.  Her lower rate was set at 60.  Rate response was  turned on.  She was 100% V-paced.   IMPRESSION:  1. Congenital complete heart block.  2. Atrial fibrillation.  3. Status post pacemaker insertion.  4. Remote history of heart failure, now resolved (the patient has      class I on medical therapy).  5. Hypertension, well-controlled on medical therapy.   DISCUSSION:  Ms. Banken is stable.  Her pacemaker is working normally.  Her blood pressure is well-controlled.  Her weight is up and I have  asked that she work on lowering her weight by eating less and trying to  exercise more.  We will send to see her back in 1 year.  She will follow  up in our transtelephonic monitoring and our Nurse Clinic in 6 months.  Champ Mungo. Lovena Le, MD  Electronically Signed    GWT/MedQ  DD: 01/06/2008  DT: 01/06/2008  Job #: [id number]

## 2010-07-10 NOTE — Letter (Signed)
Jul 07, 2008    [address], [place]  [place], [place] [address]   RE:  KANYIA, MORIARITY  MRN:  [id number]  /  DOB:  April 10, 1960   Dear Brandon Melnick:   Ms. Chute returns to the office for continued assessment and  treatment of a nonischemic cardiomyopathy, sick sinus syndrome requiring  pacemaker placement approximately a decade ago and dyslipidemia.  Since  her last visit, she has done extremely well.  She reports no dyspnea nor  chest discomfort.  She has no orthopnea nor PND.  She does not note  pedal edema.  She did have an episode of epigastric pain radiating to  the back thought to represent biliary colic.  This resolved  spontaneously.  She has had and  continues to have cholelithiasis.  She  was seen in Pacemaker Clinic 6 months ago with good function of her  device.  Anticoagulation has been therapeutic and stable.   Current cardiac medications include:  1. Benazepril 20 mg daily.  2. Atorvastatin 5 mg daily.  3. Furosemide 60 mg daily.  4. KCl 20 mEq daily.  5. Magnesium 2 tablets b.i.d. - the patient has been instructed to      discontinue this after her current prescription has exhausted.  6. Warfarin as directed.  7. Digoxin 0.125 mg daily.  8. Spironolactone 25 mg daily.  9. Carvedilol 12.5 mg b.i.d.  10.Insulin under Dr. Ronnie Derby direction.   On exam, overweight, pleasant woman in no acute distress.  The weight is  199, 4 pounds less than last year.  The blood pressure 100/70, heart  rate 60 and regular, respirations 12 and unlabored.  HEENT:  Anicteric sclerae; normal lids and conjunctivae.  NECK:  No jugular venous distention; mild HJR when semirecumbent.  Normal carotid upstrokes without bruits.  ENDOCRINE:  No thyromegaly.  SKIN:  No significant lesions.  LUNGS:  Clear.  CARDIAC:  Regular rhythm; normal first and second heart sounds; modest  systolic ejection murmur; no precordial lifts.  ABDOMEN:  Soft and nontender; normal bowel sounds;  no organomegaly.  EXTREMITIES:  No edema; normal distal pulses.   EKG:  Underlying atrial fibrillation; ventricular demand pacing.  No  previous tracing for comparison.   IMPRESSION:  Ms. Foskett continues to do beautifully despite a moderate-  to-severe nonischemic cardiomyopathy and sick sinus syndrome.  Her most  recent echocardiogram was 5 years ago and suggested an ejection fraction  of 30%.  It is somewhat surprising that she has done so well clinically.  A repeat study will be obtained to reassess LV systolic function.  We  will check CBC and stool for hemoccult testing due to chronic  anticoagulation.  Labs from Dr. Dwyane Dee will be requested.  I will see  this nice woman again in 1 year.  A chemistry profile will be repeated  in 6 months.    Sincerely,      Cristopher Estimable. Lattie Haw, MD, [facility]  Electronically Signed    RMR/MedQ  DD: 07/07/2008  DT: 07/08/2008  Job #: [id number]   CC:   Chelsea Primus, MD  Elayne Snare, M.D.

## 2010-07-10 NOTE — Letter (Signed)
Jul 08, 2007    [address]aye [address] Le[address] Rams, [place]  [place], [place] [address]   RE:  ALORIA, CANCIENNE  MRN:  [id number]  /  DOB:  Mar 06, 1960   Dear Brandon Melnick:   Ms. Devol returns to the office as scheduled for continued assessment  and treatment of cardiomyopathy and congenital complete heart block.  She also has underlying atrial fibrillation and has suffered an embolic  event in the past.  Despite multiple serious medical problems including  insulin-requiring diabetes, she notes that she feels wonderful.  She has  not been hospitalized since her pacemaker was upgraded 3 years ago.   Current medications are unchanged from her last visit except for  modification of her diabetic regimen and change from pravastatin 20 mg  daily to simvastatin 10 mg daily.   On exam, a pleasant, overweight woman in no acute distress.  The weight is 190, 10 pounds more than last year and 25 pounds more than  in 2006.  Blood pressure 100/75, heart rate 64 and regular, respirations  14.  NECK:  No jugular venous distention.  Normal carotid upstrokes without  bruits.  LUNGS:  Clear.  CARDIAC:  Normal first and second heart sounds.  Grade A999333 basilar  systolic murmur.  ABDOMEN:  Soft and nontender.  No organomegaly.  EXTREMITIES:  No edema.   INR is 2.5.   IMPRESSION:  Ms. Hulslander is doing extremely well with current medical  therapy.  We will obtain recent laboratory performed in Dr. Ronnie Derby  office.  We will check stool for the presence of  occult blood and plan a return formal office visit in 1 year.  She will  be seen in Coumadin Clinic in the interim and pacemaker clinic late this  year.    Sincerely,      Cristopher Estimable. Lattie Haw, MD, [facility]  Electronically Signed    RMR/MedQ  DD: 07/08/2007  DT: 07/08/2007  Job #: [id number]   CC:   Alison Murray, M.D.  Elayne Snare, M.D.

## 2010-07-11 ENCOUNTER — Ambulatory Visit (INDEPENDENT_AMBULATORY_CARE_PROVIDER_SITE_OTHER): Payer: Medicaid Other | Admitting: *Deleted

## 2010-07-11 DIAGNOSIS — I4891 Unspecified atrial fibrillation: Secondary | ICD-10-CM

## 2010-07-11 DIAGNOSIS — Z7901 Long term (current) use of anticoagulants: Secondary | ICD-10-CM

## 2010-07-11 LAB — POCT INR: INR: 2.3

## 2010-07-13 NOTE — Op Note (Signed)
[name], PILLER NO.:  [id number]   MEDICAL RECORD NO.:  [id number]          PATIENT TYPE:  OIB   LOCATION:  2899                         FACILITY:  [facility]   PHYSICIAN:  Champ Mungo. Lovena Le, M.D.  DATE OF BIRTH:  02-Dec-1960   DATE OF PROCEDURE:  01/31/2004  DATE OF DISCHARGE:  01/31/2004                                 OPERATIVE REPORT   PROCEDURE PERFORMED:  Removal of a previously-implanted pacemaker generator  and insertion of a new pacemaker generator.   INDICATIONS:  Complete heart block with underlying atrial fibrillation.   INTRODUCTION:  The patient is a very pleasant middle-aged woman with  congenital complete heart block, who underwent initial permanent pacemaker  implantation six years ago.  The patient developed congestive heart failure  several years ago and on maximal medical therapy has been stable with class  I symptoms.  Her ejection fraction is approximately 25%.  Because of her  complete heart block and pacemaker generator now at elective replacement  indication and because she is still class I with regard to her heart block  despite maximal medical therapy, she is referred for pacemaker generator  exchange.   PROCEDURE:  After informed consent was obtained, the patient was taken to  the diagnostic EP lab in the fasted state.  After the usual preparation and  draping, intravenous fentanyl and midazolam were given for sedation.  Lidocaine 30 mL was infiltrated into the left infraclavicular region.  A 5  cm incision was carried out over this region and electrocautery used to  dissect down to the previous pacemaker generator.  This was subsequently  removed with gentle traction without difficulty.  The device was analyzed.  The patient's underlying escape rhythm was 30-35 beats per minute.  The R-  waves measured 8 mV and the pacing impedance 669 Ohms.  The pacing threshold  (unipolar) was 0.6 V at 0.5 msec.  With these satisfactory parameters,  the  new Guidant [employer] SR model [id number], serial number [id number], single-  chamber pacemaker was then connected to the unipolar pacing lead and placed  back in the subcutaneous pocket.  The pocket was irrigated with kanamycin  and electrocautery was utilized to assure hemostasis.  The incision was then  closed with a layer of 2-0 Vicryl, followed by a layer of 3-0 Vicryl,  followed by a layer of 4-0 Vicryl.  Benzoin was painted on the skin and  Steri-Strips were applied and a pressure dressing placed, and the patient  returned to her room in satisfactory condition.   COMPLICATIONS:  There were no immediate procedural complications.   RESULTS:  This demonstrates successful removal of a previously-implanted  Guidant single-chamber pacemaker at elective replacement indication,  followed by the successful insertion of a Guidant single-chamber pacemaker,  without immediate procedural complications.       GWT/MEDQ  D:  01/31/2004  T:  02/01/2004  Job:  [id number]   cc:   [address]aye [address] Le[address] Rams, [place]  [place] [address]  Fax: [phone]   [name], M.D.

## 2010-07-13 NOTE — Letter (Signed)
June 13, 2006    Tes[address]aye [address] Legrand Rams, [address]  [place], [place] [address]   RE:  CHARESE, WERGIN  MRN:  [id number]  /  DOB:  10/26/1960   Dear Brandon Melnick:   Ms. Geitz returns to the office for continued assessment and  treatment of conduction system disease and cardiomyopathy.  Since her  last visit she has done superbly.  She denies all cardiopulmonary  symptoms.  Her pacemaker was interrogated in December of last year and  found to be functioning normally.  She has mild renal insufficiency that  is stable.  Her diabetic care is now being provided by Dr. Dwyane Dee.  This  has resulted in institution of insulin therapy with NovoLog 3 times a  day and long acting insulin twice a day.  Her other medications are  unchanged.   EXAMINATION:  Delightful, well-appearing woman.  The weight is 180, 14  pounds more than last year.  Blood pressure 105/65, heart rate 62 and  regular, respirations 16.  NECK:  No jugular venous distention; some fullness but no definite  thyromegaly.  LUNGS:  Clear.  CARDIAC:  Normal first and second heart sounds.  ABDOMEN:  Soft and nontender; no organomegaly.  EXTREMITIES:  Normal distal pulses on the left; no right dorsalis pedis  by palpation or Doppler; right posterior tibial is monophasic.   IMPRESSION:  Ms. Edgeman is doing superbly with respect to cardiac  disease.  Her exercise capacity is so good that one is inclined to  wonder whether she continues to have severe left ventricular  dysfunction.  This was assessed in 2005, two years after her initial  echocardiogram, and there was no major improvement.  Her cardiac  medication is appropriate.  Due to the fact she has insulin-requiring  diabetes and possible peripheral vascular disease, a treatment will be  instituted with simvastatin 40 mg daily.  A hepatic profile and lipid  profile will be obtained in one month.  I will see this nice woman again  in one year.  She continues to be  followed in our Coumadin and Pacemaker  Clinics.    Sincerely,      Cristopher Estimable. Lattie Haw, MD, [facility]  Electronically Signed    RMR/MedQ  DD: 06/13/2006  DT: 06/13/2006  Job #: [id number]   CC:   Alison Murray, M.D.  Elayne Snare, M.D.

## 2010-07-13 NOTE — Assessment & Plan Note (Signed)
[facility]                         ELECTROPHYSIOLOGY OFFICE NOTE   TEMPRANCE, BENTANCOURT                      MRN:          [id number]  DATE:02/06/2006                            DOB:          01/01/1961    HISTORY OF PRESENT ILLNESS:  [name] is seen today in the  [facility] on February 06, 2006, for follow up of her Guidant  model [phone] Intra.  Date of implant is January 31, 2004, for complete  heart block and atrial fibrillation.   On interrogation of her device today, her battery voltage is at  beginning of life.  R waves are not measures.  She is pacemaker  dependent to a rate of 30 with a ventricular pacing threshold of 0.9  volts at 0.2 milliseconds and a ventricular lead impedence of 400 ohms.  No changes were made in her parameters today.  She will continue with  her telephone checks on an every three month basis with [facility] with a  return office visit in one years time.      Toni Friendly, LPN  Electronically Signed      Champ Mungo. Toni Le, MD  Electronically Signed   PO/MedQ  DD: 02/06/2006  DT: 02/06/2006  Job #: [id number]

## 2010-07-13 NOTE — Procedures (Signed)
[name], [name]                         ACCOUNT NO.:  [id number]   MEDICAL RECORD NO.:  [id number]                   PATIENT TYPE:  OUT   LOCATION:  RAD                                  FACILITY:  [facility]   PHYSICIAN:  [name], M.D.               DATE OF BIRTH:  [birth year]   DATE OF PROCEDURE:  10/05/2003  DATE OF DISCHARGE:                                  ECHOCARDIOGRAM   REFERRING PHYSICIANS:  1. Tesfaye D. Legrand Rams, M.D.  Alburnett Lovena Le, M.D.   CLINICAL DATA:  A 50 year old woman with conduction system disease and  cardiomyopathy.   M-MODE:  Aorta 2.7.  Left atrium 4.6.  Septum 1.1.  Posterior wall 1.2.  LV  diastole 5.7.  LV systole 5.3.   1. Technically adequate echocardiographic study.  2. Moderate left atrial enlargement; mild right atrial enlargement.  Normal     right ventricular size and function.  Pacing wire visualized traversing     the right heart.  3. Normal aortic valve.  4. Mild mitral valve thickening; very mild mitral valve regurgitation.  5. Normal tricuspid valve; moderate regurgitation; normal estimated right     ventricular systolic pressure.  6. Normal pulmonic valves; normal pulmonary artery.  7. Mild left ventricular dysfunction and hypertrophy; global hypokinesis     with virtual akinesis of the anteroseptal region and apex.  Overall left     ventricular systolic function moderately to markedly impaired with an     estimated ejection fraction of 0.30.  8. Inferior vena cava dimension at the upper limit of normal; diameter     decreases normally with inspiration.  9. Comparison with June 17, 2001:  No significant interval change.      ___________________________________________                                            [name], M.D.   RR/MEDQ  D:  10/06/2003  T:  10/06/2003  Job:  [id number]

## 2010-07-26 ENCOUNTER — Encounter: Payer: Self-pay | Admitting: Internal Medicine

## 2010-07-26 ENCOUNTER — Ambulatory Visit (INDEPENDENT_AMBULATORY_CARE_PROVIDER_SITE_OTHER): Payer: Medicaid Other | Admitting: Internal Medicine

## 2010-07-26 DIAGNOSIS — I495 Sick sinus syndrome: Secondary | ICD-10-CM

## 2010-07-26 DIAGNOSIS — Z950 Presence of cardiac pacemaker: Secondary | ICD-10-CM

## 2010-07-26 DIAGNOSIS — I4891 Unspecified atrial fibrillation: Secondary | ICD-10-CM

## 2010-07-26 DIAGNOSIS — Z7901 Long term (current) use of anticoagulants: Secondary | ICD-10-CM

## 2010-07-26 DIAGNOSIS — I428 Other cardiomyopathies: Secondary | ICD-10-CM

## 2010-07-26 NOTE — Assessment & Plan Note (Signed)
Her symptoms are currently class I. She will continue her current medications.

## 2010-07-26 NOTE — Progress Notes (Signed)
HPI [name] returns today for followup. She is a pleasant 50 year old woman with congenital complete heart block, chronic atrial fibrillation, chronic Coumadin therapy, and chronic class I systolic heart failure. Previously she has an ejection fraction of 25-30%. Despite this she has done very well. For over 10 years now she has had no heart failure symptoms to speak of. She denies syncope, palpitations, or trouble with taking her medications. She had no specific complaints today. No Known Allergies   Current Outpatient Prescriptions  Medication Sig Dispense Refill  . allopurinol (ZYLOPRIM) 100 MG tablet Take 100 mg by mouth daily.        . benazepril (LOTENSIN) 20 MG tablet Take 20 mg by mouth daily.        . calcium-vitamin D (OSCAL) 250-125 MG-UNIT per tablet Take 1 tablet by mouth daily.       . carvedilol (COREG) 12.5 MG tablet Take 12.5 mg by mouth 2 (two) times daily with a meal.        . Cholecalciferol (VITAMIN D3) 2000 UNITS capsule Take 2,000 Units by mouth daily. Take every other day       . furosemide (LASIX) 40 MG tablet Take 40 mg by mouth. Take 1 and 1/2 tablets po daily       . insulin aspart (NOVOLOG) 100 UNIT/ML injection Inject into the skin. Inject 12 units at 8:00am, 18 units at lunch and 22 units at suppertime      . insulin glargine (LANTUS) 100 UNIT/ML injection Inject into the skin. Inject40 units in am and30 units at bedtime      . insulin NPH (HUMULIN N,NOVOLIN N) 100 UNIT/ML injection Inject 38 Units into the skin at bedtime.       Marland Kitchen LANOXIN 0.125 MG tablet Take 1 tablet (125 mcg total) by mouth daily. Monday thru Friday, none on Saturday and Sunday  20 tablet  6  . Magnesium 100 MG TABS Take 1 tablet by mouth daily.        . potassium chloride (KLOR-CON) 20 MEQ packet Take 20 mEq by mouth daily.        Marland Kitchen spironolactone (ALDACTONE) 25 MG tablet Take 25 mg by mouth daily.        Marland Kitchen warfarin (COUMADIN) 5 MG tablet Take 5 mg by mouth daily. As directed by coumadin  clinic          Past Medical History  Diagnosis Date  . Hypertension   . Chronic kidney disease     creatinine- 1.8 in 09/2006 and 2.0 in 05/2007; 2.05 in 2011, 2.29 in 2012  . Reversible ischemic neurological deficit     embolic  . Diabetes mellitus     insulin treated  . Congenital third degree heart block     Guidant VVI pacemaker implanted in 09/1997; 8generator change in 01/2004   . Chronic atrial fibrillation     embolic rind  . Cardiomyopathy     with congestive heart failure   ejection fraction of 30% in 09/2003  . Chronic anticoagulation     ROS:   All systems reviewed and negative except as noted in the HPI.   Past Surgical History  Procedure Date  . Insert / replace / remove pacemaker 09/1997 w/gen. change in 01/2004    Guidant VVI [employer]     Family History  Problem Relation Age of Onset  . Adopted: Yes     History   Social History  . Marital Status: Married    Spouse  Name: N/A    Number of Children: 4  . Years of Education: N/A   Occupational History  .     Social History Main Topics  . Smoking status: Never Smoker   . Smokeless tobacco: Never Used  . Alcohol Use: No  . Drug Use: No  . Sexually Active: Not on file   Other Topics Concern  . Not on file   Social History Narrative  . No narrative on file     BP 103/64  Pulse 68  Wt 186 lb (84.369 kg)  Physical Exam:  Well appearing NAD HEENT: Unremarkable Neck:  No JVD, no thyromegally Lymphatics:  No adenopathy Back:  No CVA tenderness Lungs:  Clear. Well-healed pacemaker incision. HEART:  Regular rate rhythm, no murmurs, no rubs, no clicks Abd:  positive bowel sounds, no organomegally, no rebound, no guarding Ext:  2 plus pulses, no edema, no cyanosis, no clubbing Skin:  No rashes no nodules Neuro:  CN II through XII intact, motor grossly intact  DEVICE  Normal device function.  See PaceArt for details.   Assess/Plan:

## 2010-07-26 NOTE — Assessment & Plan Note (Signed)
Her device is working normally. Battery longevity is approximately 3-4 years.

## 2010-07-26 NOTE — Patient Instructions (Signed)
**Note De-identified Kaly Mcquary Obfuscation** Your physician recommends that you continue on your current medications as directed. Please refer to the Current Medication list given to you today.  Your physician recommends that you schedule a follow-up appointment in: 1 year  

## 2010-07-26 NOTE — Assessment & Plan Note (Signed)
She has been asymptomatic. We'll continue her current medications.

## 2010-08-08 ENCOUNTER — Ambulatory Visit (INDEPENDENT_AMBULATORY_CARE_PROVIDER_SITE_OTHER): Payer: Medicaid Other | Admitting: *Deleted

## 2010-08-08 DIAGNOSIS — I4891 Unspecified atrial fibrillation: Secondary | ICD-10-CM

## 2010-08-08 LAB — POCT INR: INR: 3.3

## 2010-08-16 ENCOUNTER — Other Ambulatory Visit: Payer: Self-pay | Admitting: Cardiology

## 2010-08-17 ENCOUNTER — Emergency Department (HOSPITAL_COMMUNITY)
Admission: EM | Admit: 2010-08-17 | Discharge: 2010-08-17 | Disposition: A | Discharge status: Home / Self Care | Payer: Medicaid Other | Attending: Emergency Medicine | Admitting: Emergency Medicine

## 2010-08-17 ENCOUNTER — Other Ambulatory Visit: Payer: Self-pay | Admitting: Cardiology

## 2010-08-17 ENCOUNTER — Other Ambulatory Visit: Payer: Self-pay

## 2010-08-17 ENCOUNTER — Emergency Department (HOSPITAL_COMMUNITY): Payer: Medicaid Other

## 2010-08-17 DIAGNOSIS — S6000XA Contusion of unspecified finger without damage to nail, initial encounter: Secondary | ICD-10-CM | POA: Insufficient documentation

## 2010-08-17 DIAGNOSIS — M109 Gout, unspecified: Secondary | ICD-10-CM | POA: Insufficient documentation

## 2010-08-17 DIAGNOSIS — X088XXA Exposure to other specified smoke, fire and flames, initial encounter: Secondary | ICD-10-CM | POA: Insufficient documentation

## 2010-08-17 DIAGNOSIS — Z8673 Personal history of transient ischemic attack (TIA), and cerebral infarction without residual deficits: Secondary | ICD-10-CM | POA: Insufficient documentation

## 2010-08-17 DIAGNOSIS — E119 Type 2 diabetes mellitus without complications: Secondary | ICD-10-CM | POA: Insufficient documentation

## 2010-08-17 DIAGNOSIS — X58XXXA Exposure to other specified factors, initial encounter: Secondary | ICD-10-CM | POA: Insufficient documentation

## 2010-08-17 DIAGNOSIS — I509 Heart failure, unspecified: Secondary | ICD-10-CM | POA: Insufficient documentation

## 2010-08-17 DIAGNOSIS — S61209A Unspecified open wound of unspecified finger without damage to nail, initial encounter: Secondary | ICD-10-CM | POA: Insufficient documentation

## 2010-08-17 DIAGNOSIS — I2510 Atherosclerotic heart disease of native coronary artery without angina pectoris: Secondary | ICD-10-CM | POA: Insufficient documentation

## 2010-08-17 DIAGNOSIS — Z950 Presence of cardiac pacemaker: Secondary | ICD-10-CM | POA: Insufficient documentation

## 2010-08-17 DIAGNOSIS — T22019A Burn of unspecified degree of unspecified forearm, initial encounter: Secondary | ICD-10-CM | POA: Insufficient documentation

## 2010-08-17 DIAGNOSIS — I499 Cardiac arrhythmia, unspecified: Secondary | ICD-10-CM | POA: Insufficient documentation

## 2010-08-17 DIAGNOSIS — I10 Essential (primary) hypertension: Secondary | ICD-10-CM | POA: Insufficient documentation

## 2010-08-17 MED ORDER — WARFARIN SODIUM 5 MG PO TABS: 5.0000 mg | ORAL_TABLET | Freq: Every day | ORAL | Status: DC

## 2010-08-17 MED ORDER — CARVEDILOL 12.5 MG PO TABS: 12.5000 mg | ORAL_TABLET | Freq: Two times a day (BID) | ORAL | Status: DC

## 2010-08-17 NOTE — Telephone Encounter (Deleted)
Pt called from pharmacy for refill on. Requested Prescriptions   Signed Prescriptions Disp Refills  . furosemide (LASIX) 40 MG tablet 45 tablet 10    Sig: TAKE 1 & 1/2 TABLETS BY MOUTH ONCE DAILY.    Authorizing Provider: Yehuda Savannah.    Ordering User: VIA, PATRICIA M   Pt called to have medicine to be recalled into [facility]. Pt is waiting in the pharmacy to be filled.

## 2010-08-22 ENCOUNTER — Other Ambulatory Visit: Payer: Self-pay

## 2010-08-22 MED ORDER — COUMADIN 5 MG PO TABS: 5.0000 mg | ORAL_TABLET | Freq: Every day | ORAL | Status: DC

## 2010-09-04 ENCOUNTER — Other Ambulatory Visit: Payer: Self-pay | Admitting: *Deleted

## 2010-09-04 ENCOUNTER — Encounter: Payer: Self-pay | Admitting: *Deleted

## 2010-09-04 MED ORDER — COREG 25 MG PO TABS: 25.0000 mg | ORAL_TABLET | Freq: Two times a day (BID) | ORAL | Status: DC

## 2010-09-04 MED ORDER — COUMADIN 5 MG PO TABS: ORAL_TABLET | ORAL | Status: DC

## 2010-09-04 MED ORDER — CARVEDILOL 12.5 MG PO TABS: 25.0000 mg | ORAL_TABLET | Freq: Two times a day (BID) | ORAL | Status: DC

## 2010-09-05 ENCOUNTER — Ambulatory Visit (INDEPENDENT_AMBULATORY_CARE_PROVIDER_SITE_OTHER): Payer: Medicaid Other | Admitting: *Deleted

## 2010-09-05 DIAGNOSIS — I4891 Unspecified atrial fibrillation: Secondary | ICD-10-CM

## 2010-09-05 DIAGNOSIS — Z7901 Long term (current) use of anticoagulants: Secondary | ICD-10-CM

## 2010-09-17 ENCOUNTER — Other Ambulatory Visit: Payer: Self-pay | Admitting: Cardiology

## 2010-09-17 MED ORDER — LANOXIN 125 MCG PO TABS: 125.0000 ug | ORAL_TABLET | Freq: Every day | ORAL | Status: DC

## 2010-10-03 ENCOUNTER — Encounter: Payer: Medicaid Other | Admitting: *Deleted

## 2010-10-04 ENCOUNTER — Encounter: Payer: Medicaid Other | Admitting: *Deleted

## 2010-10-08 ENCOUNTER — Ambulatory Visit (INDEPENDENT_AMBULATORY_CARE_PROVIDER_SITE_OTHER): Payer: Medicaid Other | Admitting: *Deleted

## 2010-10-08 DIAGNOSIS — Z7901 Long term (current) use of anticoagulants: Secondary | ICD-10-CM

## 2010-10-08 DIAGNOSIS — I4891 Unspecified atrial fibrillation: Secondary | ICD-10-CM

## 2010-10-08 LAB — POCT INR: INR: 2.1

## 2010-10-10 ENCOUNTER — Encounter: Payer: Medicaid Other | Admitting: *Deleted

## 2010-10-15 DIAGNOSIS — I442 Atrioventricular block, complete: Secondary | ICD-10-CM

## 2010-10-19 ENCOUNTER — Other Ambulatory Visit: Payer: Self-pay | Admitting: Obstetrics & Gynecology

## 2010-10-19 ENCOUNTER — Other Ambulatory Visit (HOSPITAL_COMMUNITY)
Admission: RE | Admit: 2010-10-19 | Discharge: 2010-10-19 | Disposition: A | Discharge status: Home / Self Care | Payer: Medicaid Other | Source: Ambulatory Visit | Attending: Obstetrics & Gynecology | Admitting: Obstetrics & Gynecology

## 2010-10-19 DIAGNOSIS — Z01419 Encounter for gynecological examination (general) (routine) without abnormal findings: Secondary | ICD-10-CM | POA: Insufficient documentation

## 2010-11-05 ENCOUNTER — Ambulatory Visit (INDEPENDENT_AMBULATORY_CARE_PROVIDER_SITE_OTHER): Payer: Medicaid Other | Admitting: *Deleted

## 2010-11-05 DIAGNOSIS — Z7901 Long term (current) use of anticoagulants: Secondary | ICD-10-CM

## 2010-11-05 DIAGNOSIS — I4891 Unspecified atrial fibrillation: Secondary | ICD-10-CM

## 2010-11-07 ENCOUNTER — Other Ambulatory Visit: Payer: Self-pay | Admitting: Cardiology

## 2010-11-08 LAB — BASIC METABOLIC PANEL
CO2: 20 mEq/L (ref 19–32)
Glucose, Bld: 88 mg/dL (ref 70–99)
Potassium: 4.9 mEq/L (ref 3.5–5.3)
Sodium: 139 mEq/L (ref 135–145)

## 2010-11-26 LAB — GLUCOSE, CAPILLARY

## 2010-12-03 ENCOUNTER — Encounter: Payer: Medicaid Other | Admitting: *Deleted

## 2010-12-06 ENCOUNTER — Ambulatory Visit (INDEPENDENT_AMBULATORY_CARE_PROVIDER_SITE_OTHER): Payer: Medicaid Other | Admitting: *Deleted

## 2010-12-06 DIAGNOSIS — I4891 Unspecified atrial fibrillation: Secondary | ICD-10-CM

## 2010-12-06 DIAGNOSIS — Z7901 Long term (current) use of anticoagulants: Secondary | ICD-10-CM

## 2010-12-06 LAB — POCT INR: INR: 2.5

## 2011-01-07 ENCOUNTER — Encounter: Payer: Medicaid Other | Admitting: *Deleted

## 2011-01-09 ENCOUNTER — Telehealth: Payer: Self-pay | Admitting: *Deleted

## 2011-01-09 NOTE — Telephone Encounter (Signed)
APPT [facility] FOR 01/14/11/TMJ

## 2011-01-14 ENCOUNTER — Ambulatory Visit (INDEPENDENT_AMBULATORY_CARE_PROVIDER_SITE_OTHER): Payer: Medicaid Other | Admitting: *Deleted

## 2011-01-14 DIAGNOSIS — Z7901 Long term (current) use of anticoagulants: Secondary | ICD-10-CM

## 2011-01-14 DIAGNOSIS — I4891 Unspecified atrial fibrillation: Secondary | ICD-10-CM

## 2011-02-04 ENCOUNTER — Encounter: Payer: Self-pay | Admitting: Internal Medicine

## 2011-02-04 DIAGNOSIS — I442 Atrioventricular block, complete: Secondary | ICD-10-CM

## 2011-02-11 ENCOUNTER — Ambulatory Visit (INDEPENDENT_AMBULATORY_CARE_PROVIDER_SITE_OTHER): Payer: Medicaid Other | Admitting: *Deleted

## 2011-02-11 DIAGNOSIS — I4891 Unspecified atrial fibrillation: Secondary | ICD-10-CM

## 2011-02-11 DIAGNOSIS — Z7901 Long term (current) use of anticoagulants: Secondary | ICD-10-CM

## 2011-02-11 LAB — POCT INR: INR: 2.2

## 2011-03-11 ENCOUNTER — Ambulatory Visit (INDEPENDENT_AMBULATORY_CARE_PROVIDER_SITE_OTHER): Payer: Medicaid Other | Admitting: *Deleted

## 2011-03-11 DIAGNOSIS — Z7901 Long term (current) use of anticoagulants: Secondary | ICD-10-CM

## 2011-03-11 DIAGNOSIS — I4891 Unspecified atrial fibrillation: Secondary | ICD-10-CM

## 2011-03-11 LAB — POCT INR: INR: 2.2

## 2011-03-12 ENCOUNTER — Other Ambulatory Visit: Payer: Self-pay | Admitting: Obstetrics and Gynecology

## 2011-03-12 DIAGNOSIS — Z139 Encounter for screening, unspecified: Secondary | ICD-10-CM

## 2011-03-28 ENCOUNTER — Encounter (HOSPITAL_COMMUNITY): Payer: Self-pay | Admitting: Emergency Medicine

## 2011-03-28 ENCOUNTER — Emergency Department (HOSPITAL_COMMUNITY): Payer: Medicaid Other

## 2011-03-28 ENCOUNTER — Emergency Department (HOSPITAL_COMMUNITY)
Admission: EM | Admit: 2011-03-28 | Discharge: 2011-03-28 | Disposition: A | Discharge status: Home / Self Care | Payer: Medicaid Other | Attending: Emergency Medicine | Admitting: Emergency Medicine

## 2011-03-28 ENCOUNTER — Other Ambulatory Visit: Payer: Self-pay

## 2011-03-28 DIAGNOSIS — Z950 Presence of cardiac pacemaker: Secondary | ICD-10-CM | POA: Insufficient documentation

## 2011-03-28 DIAGNOSIS — E119 Type 2 diabetes mellitus without complications: Secondary | ICD-10-CM | POA: Insufficient documentation

## 2011-03-28 DIAGNOSIS — R1013 Epigastric pain: Secondary | ICD-10-CM | POA: Insufficient documentation

## 2011-03-28 DIAGNOSIS — R109 Unspecified abdominal pain: Secondary | ICD-10-CM

## 2011-03-28 DIAGNOSIS — Q246 Congenital heart block: Secondary | ICD-10-CM | POA: Insufficient documentation

## 2011-03-28 DIAGNOSIS — Z7901 Long term (current) use of anticoagulants: Secondary | ICD-10-CM | POA: Insufficient documentation

## 2011-03-28 DIAGNOSIS — I428 Other cardiomyopathies: Secondary | ICD-10-CM | POA: Insufficient documentation

## 2011-03-28 DIAGNOSIS — I4891 Unspecified atrial fibrillation: Secondary | ICD-10-CM | POA: Insufficient documentation

## 2011-03-28 DIAGNOSIS — N189 Chronic kidney disease, unspecified: Secondary | ICD-10-CM | POA: Insufficient documentation

## 2011-03-28 DIAGNOSIS — R011 Cardiac murmur, unspecified: Secondary | ICD-10-CM | POA: Insufficient documentation

## 2011-03-28 LAB — DIFFERENTIAL
Eosinophils Relative: 1 % (ref 0–5)
Lymphocytes Relative: 28 % (ref 12–46)
Lymphs Abs: 1.7 10*3/uL (ref 0.7–4.0)
Monocytes Absolute: 0.4 10*3/uL (ref 0.1–1.0)

## 2011-03-28 LAB — CBC
HCT: 41.3 % (ref 36.0–46.0)
Hemoglobin: 12.9 g/dL (ref 12.0–15.0)
MCV: 81.5 fL (ref 78.0–100.0)
RBC: 5.07 MIL/uL (ref 3.87–5.11)
WBC: 6.2 10*3/uL (ref 4.0–10.5)

## 2011-03-28 LAB — PROTIME-INR: Prothrombin Time: 24.6 seconds — ABNORMAL HIGH (ref 11.6–15.2)

## 2011-03-28 LAB — BASIC METABOLIC PANEL
CO2: 23 mEq/L (ref 19–32)
Calcium: 9.9 mg/dL (ref 8.4–10.5)
Chloride: 104 mEq/L (ref 96–112)
Creatinine, Ser: 1.79 mg/dL — ABNORMAL HIGH (ref 0.50–1.10)
Glucose, Bld: 213 mg/dL — ABNORMAL HIGH (ref 70–99)

## 2011-03-28 LAB — GLUCOSE, CAPILLARY: Glucose-Capillary: 215 mg/dL — ABNORMAL HIGH (ref 70–99)

## 2011-03-28 MED ORDER — ONDANSETRON HCL 4 MG/2ML IJ SOLN
4.0000 mg | Freq: Once | INTRAMUSCULAR | Status: AC
  Administered 2011-03-28: 4 mg via INTRAVENOUS
  Filled 2011-03-28: qty 2

## 2011-03-28 MED ORDER — SODIUM CHLORIDE 0.9 % IV SOLN
Freq: Once | INTRAVENOUS | Status: AC
  Administered 2011-03-28: 15:00:00 via INTRAVENOUS

## 2011-03-28 MED ORDER — PANTOPRAZOLE SODIUM 40 MG IV SOLR
40.0000 mg | Freq: Once | INTRAVENOUS | Status: AC
  Administered 2011-03-28: 40 mg via INTRAVENOUS
  Filled 2011-03-28: qty 40

## 2011-03-28 MED ORDER — PANTOPRAZOLE SODIUM 20 MG PO TBEC: 40.0000 mg | DELAYED_RELEASE_TABLET | Freq: Every day | ORAL | Status: DC

## 2011-03-28 NOTE — ED Notes (Signed)
Pt ambulated in hall, one trip around nurse's station without distress.  Pt speaking full, clear sentences during ambulation.  Sats remained 97% and > during ambulation.  Upon arriving back to room, pt c/o increased sob, sats remained 99% at time.  Pt also c/o right sided abd "soreness" with palpation.  Pt requesting meal tray.  edp notified.

## 2011-03-28 NOTE — ED Notes (Signed)
Pt states she has abdominal pain for the last 2 days and epigastric pain x 1 year.  Denies N/V/D

## 2011-03-28 NOTE — ED Provider Notes (Signed)
History     CSN: [id number]  Arrival date & time 03/28/11  1258   First MD Initiated Contact with Patient 03/28/11 1329      Chief Complaint  Patient presents with  . Abdominal Pain    (Consider location/radiation/quality/duration/timing/severity/associated sxs/prior treatment) HPI [name] is a 51 y.o. female with a h/o cardiomyopathy, atrial fibrillation, pacemaker, DM, chronic renal insufficiency  who presents to the Emergency Department complaining of a full sensation in her epigastric area that often extends up to her throat making her feel short of breath. It has been more pronounced since 18-Mar-2023, when she had a prolonged upper respiratory infection. She states that when she goes to do chores where she has to bend over,she'll experience the sensation. She also will have the same sensation when she is ambulating any distance quickly.she is not currently on any proton pump inhibitor or H2 blocker.She denies fever, chills, nausea, vomiting, diarrhea, or chest pain.  PCP Dr. Legrand Rams Cardiologist Dr. Lattie Haw Past Medical History  Diagnosis Date  . Chronic kidney disease     creatinine- 1.8 in 09/2006 and 2.0 in 05/2007; 2.05 in 2011, 2.29 in 2012  . Reversible ischemic neurological deficit     embolic  . Diabetes mellitus     insulin treated  . Congenital third degree heart block     Guidant VVI pacemaker implanted in 09/1997; 8generator change in 2004/03/17   . Chronic atrial fibrillation     embolic rind  . Cardiomyopathy     with congestive heart failure   ejection fraction of 30% in 09/2003  . Chronic anticoagulation     Past Surgical History  Procedure Date  . Insert / replace / remove pacemaker 09/1997 w/gen. change in 03-17-04    Guidant VVI [employer]    Family History  Problem Relation Age of Onset  . Adopted: Yes    History  Substance Use Topics  . Smoking status: Never Smoker   . Smokeless tobacco: Never Used  . Alcohol Use: No    OB History    Grav Para Term Preterm Abortions TAB SAB Ect Mult Living                  Review of Systems 10 Systems reviewed and are negative for acute change except as noted in the HPI. Allergies  Wheat; Latex; Penicillins; and Sulfa antibiotics  Home Medications   Current Outpatient Rx  Name Route Sig Dispense Refill  . ALLOPURINOL 100 MG PO TABS Oral Take 100 mg by mouth daily.      Marland Kitchen BENAZEPRIL HCL 20 MG PO TABS Oral Take 20 mg by mouth daily.      Marland Kitchen VITAMIN D3 2000 UNITS PO CAPS Oral Take 2,000 Units by mouth daily. Take every other day     . COREG 25 MG PO TABS Oral Take 1 tablet (25 mg total) by mouth 2 (two) times daily. 60 tablet 11    Dispense as written.  . FUROSEMIDE 40 MG PO TABS  TAKE 1 & 1/2 TABLETS BY MOUTH ONCE DAILY. 45 tablet 10  . INSULIN ASPART 100 UNIT/ML [place] SOLN Subcutaneous Inject 12-20 Units into the skin 3 (three) times daily. Inject 12 units at 8:00am, 16 units at lunch and 20 units at suppertime    . INSULIN GLARGINE 100 UNIT/ML [place] SOLN Subcutaneous Inject 30-40 Units into the skin 2 (two) times daily. Inject 40 units in am and 30 units at bedtime    . INSULIN ISOPHANE HUMAN  100 UNIT/ML [place] SUSP Subcutaneous Inject 34 Units into the skin at bedtime.     Marland Kitchen LANOXIN 0.125 MG PO TABS Oral Take 1 tablet (125 mcg total) by mouth daily. Monday thru Friday, none on Saturday and Sunday 20 tablet 6    Dispense as written.  Marland Kitchen MAGNESIUM 100 MG PO TABS Oral Take 1 tablet by mouth daily.     Marland Kitchen POTASSIUM CHLORIDE 20 MEQ PO PACK Oral Take 20 mEq by mouth daily.      Marland Kitchen SPIRONOLACTONE 25 MG PO TABS Oral Take 25 mg by mouth daily.      . WARFARIN SODIUM 5 MG PO TABS  Takes 2.5 mg on Sun, Mon, Wed, Fri, and Sat. Takes 5 mg on Tues and Thurs.      BP 125/89  Pulse 60  Resp 18  Ht 5\' 6"  (1.676 m)  Wt 192 lb (87.091 kg)  BMI 30.99 kg/m2  SpO2 100%  Physical Exam  Nursing note and vitals reviewed. Constitutional: She is oriented to person, place, and time. She appears well-developed  and well-nourished. No distress.  HENT:  Head: Normocephalic.  Right Ear: External ear normal.  Left Ear: External ear normal.  Nose: Nose normal.  Mouth/Throat: Oropharynx is clear and moist.  Eyes: EOM are normal. Pupils are equal, round, and reactive to light.  Neck: Normal range of motion. Neck supple.  Cardiovascular: Normal rate and intact distal pulses.  Exam reveals no gallop and no friction rub.   Murmur heard.      pacemaker  Pulmonary/Chest: Effort normal and breath sounds normal.  Abdominal: Soft. Bowel sounds are normal. She exhibits no distension. There is no tenderness. There is no rebound and no guarding.  Musculoskeletal: Normal range of motion.  Neurological: She is alert and oriented to person, place, and time. She has normal reflexes.  Skin: Skin is dry.  Psychiatric: She has a normal mood and affect.    ED Course  Procedures (including critical care time) Results for orders placed during the hospital encounter of 03/28/11  GLUCOSE, CAPILLARY      Component Value Range   Glucose-Capillary 215 (*) 70 - 99 (mg/dL)   Comment 1 Documented in Chart    CBC      Component Value Range   WBC 6.2  4.0 - 10.5 (K/uL)   RBC 5.07  3.87 - 5.11 (MIL/uL)   Hemoglobin 12.9  12.0 - 15.0 (g/dL)   HCT 41.3  36.0 - 46.0 (%)   MCV 81.5  78.0 - 100.0 (fL)   MCH 25.4 (*) 26.0 - 34.0 (pg)   MCHC 31.2  30.0 - 36.0 (g/dL)   RDW 16.1 (*) 11.5 - 15.5 (%)   Platelets 142 (*) 150 - 400 (K/uL)  DIFFERENTIAL      Component Value Range   Neutrophils Relative 65  43 - 77 (%)   Neutro Abs 4.0  1.7 - 7.7 (K/uL)   Lymphocytes Relative 28  12 - 46 (%)   Lymphs Abs 1.7  0.7 - 4.0 (K/uL)   Monocytes Relative 6  3 - 12 (%)   Monocytes Absolute 0.4  0.1 - 1.0 (K/uL)   Eosinophils Relative 1  0 - 5 (%)   Eosinophils Absolute 0.1  0.0 - 0.7 (K/uL)   Basophils Relative 0  0 - 1 (%)   Basophils Absolute 0.0  0.0 - 0.1 (K/uL)  BASIC METABOLIC PANEL      Component Value Range   Sodium 136  135  -  145 (mEq/L)   Potassium 5.2 (*) 3.5 - 5.1 (mEq/L)   Chloride 104  96 - 112 (mEq/L)   CO2 23  19 - 32 (mEq/L)   Glucose, Bld 213 (*) 70 - 99 (mg/dL)   BUN 38 (*) 6 - 23 (mg/dL)   Creatinine, Ser 1.79 (*) 0.50 - 1.10 (mg/dL)   Calcium 9.9  8.4 - 10.5 (mg/dL)   GFR calc non Af Amer 32 (*) >90 (mL/min)   GFR calc Af Amer 37 (*) >90 (mL/min)  TROPONIN I      Component Value Range   Troponin I <0.30  <0.30 (ng/mL)  PROTIME-INR      Component Value Range   Prothrombin Time 24.6 (*) 11.6 - 15.2 (seconds)   INR 2.18 (*) 0.00 - 1.49     Date: 03/28/2011  1336  Rate:60  Rhythm: electronic pacemaker  QRS Axis: indeterminate  Intervals: electronic pacemaker  ST/T Wave abnormalities: normal  Conduction Disutrbances:electronic pacemaker  Narrative Interpretation:   Old EKG Reviewed: unchanged Dg Chest 2 View  03/28/2011  *RADIOLOGY REPORT*  Clinical Data: Shortness of breath.  CHEST - 2 VIEW  Comparison: PA and lateral chest 08/06/2007.  Findings: Single lead pacing device remains in place.  Marked cardiomegaly is again seen.  Lungs are clear.  There is no pulmonary edema.  No pneumothorax or pleural effusion.  IMPRESSION: Marked cardiomegaly without acute disease.  Original Report Authenticated By: Arvid Right. D'ALESSIO, M.D.      MDM  Patient with epigastric discomfort and sensation of fullness when active or bending over. Labs are unremarkable. INR is therapeutic. Xray without acute findings. Patient was able to ambulate in the department without SOB. Will initiate proton pump inhibitor and follow up with GI.Pt stable in ED with no significant deterioration in condition.The patient appears reasonably screened and/or stabilized for discharge and I doubt any other medical condition or other [facility] requiring further screening, evaluation, or treatment in the ED at this time prior to discharge.  MDM Reviewed: nursing note and vitals Interpretation: labs, x-ray and ECG          Coralyn Mark S.  Olin Hauser, MD 03/28/11 1550

## 2011-03-29 HISTORY — PX: UPPER GASTROINTESTINAL ENDOSCOPY: SHX188

## 2011-04-09 ENCOUNTER — Telehealth: Payer: Self-pay

## 2011-04-09 ENCOUNTER — Encounter: Payer: Self-pay | Admitting: Gastroenterology

## 2011-04-09 ENCOUNTER — Ambulatory Visit (INDEPENDENT_AMBULATORY_CARE_PROVIDER_SITE_OTHER): Payer: Medicaid Other | Admitting: Gastroenterology

## 2011-04-09 DIAGNOSIS — Z1211 Encounter for screening for malignant neoplasm of colon: Secondary | ICD-10-CM

## 2011-04-09 DIAGNOSIS — R1310 Dysphagia, unspecified: Secondary | ICD-10-CM

## 2011-04-09 DIAGNOSIS — K3189 Other diseases of stomach and duodenum: Secondary | ICD-10-CM

## 2011-04-09 DIAGNOSIS — R05 Cough: Secondary | ICD-10-CM

## 2011-04-09 DIAGNOSIS — R1013 Epigastric pain: Secondary | ICD-10-CM

## 2011-04-09 NOTE — Patient Instructions (Signed)
We have set you up for an upper endoscopy and colonoscopy.   I will be in touch shortly regarding any more work-up prior to these procedures (such as labs or xrays).   In the meantime, continue to take Protonix daily. We will have you hold your Coumadin for 4 days prior to the procedure. We will also be clearing this with Dr. Lattie Haw.   The night before, only take 1/2 dose of your Lantus. The morning of, do not take any insulin.

## 2011-04-09 NOTE — Progress Notes (Deleted)
Has a bad cough, was on Zpack, thought had cold. Allergy medication. Neck feels strained. +SOB. Dry cough, hacking. White/phlegm. Felt a burn on RLQ a few weeks ago. Has stopped since.   Coughing non-stop. Has to sit up at night, propping pillows up to keep from coughing. Doesn't notice burning/indigestion. Had stroke in 1999. Occasional dysphagia, specifically with apples as example. +epigastric pain for last 3 weeks. Not worse with eating/drinking. N/V one time. Feels dizzy a few times. +wheezing. No fever.   No melena. No hematochezia. No diarrhea/constipation. No NSAIDs, no aspirin powders.   Helped slightly to have a BID dose of Protonix one time. Has taken Zantac in the past. Has been eating a lot of oranges.   Referring Provider: [name], MD Primary Care Physician:  [name], MD, MD Primary Gastroenterologist:  Dr.  Date of Admission:  Date of Consultation:   Reason for Consultation:  ***  HPI:   Past Medical History  Diagnosis Date  . Chronic kidney disease     creatinine- 1.8 in 09/2006 and 2.0 in 05/2007; 2.05 in 2011, 2.29 in 2012  . Reversible ischemic neurological deficit     embolic  . Diabetes mellitus     insulin treated  . Congenital third degree heart block     Guidant VVI pacemaker implanted in 09/1997; 8generator change in 01/2004   . Chronic atrial fibrillation     embolic rind  . Cardiomyopathy     with congestive heart failure   ejection fraction of 30% in 09/2003  . Chronic anticoagulation     Past Surgical History  Procedure Date  . Insert / replace / remove pacemaker 09/1997 w/gen. change in 01/2004    Guidant VVI [employer]    Prior to Admission medications   Medication Sig Start Date End Date Taking? Authorizing Provider  allopurinol (ZYLOPRIM) 100 MG tablet Take 100 mg by mouth daily.     Yes Historical Provider, MD  azithromycin (ZITHROMAX) 250 MG tablet Take 250 mg by mouth daily.   Yes Historical Provider, MD  benazepril  (LOTENSIN) 20 MG tablet Take 20 mg by mouth daily.     Yes Historical Provider, MD  Cholecalciferol (VITAMIN D3) 2000 UNITS capsule Take 2,000 Units by mouth daily. Take every other day    Yes Historical Provider, MD  COREG 25 MG tablet Take 1 tablet (25 mg total) by mouth 2 (two) times daily. 09/04/10 09/04/11 Yes Cristopher Estimable. Rothbart, MD  furosemide (LASIX) 40 MG tablet TAKE 1 & 1/2 TABLETS BY MOUTH ONCE DAILY. 08/17/10  Yes Cristopher Estimable. Rothbart, MD  insulin aspart (NOVOLOG) 100 UNIT/ML injection Inject 12-20 Units into the skin 3 (three) times daily. Inject 12 units at 8:00am, 16 units at lunch and 20 units at suppertime   Yes Historical Provider, MD  insulin glargine (LANTUS) 100 UNIT/ML injection Inject 30-40 Units into the skin 2 (two) times daily. Inject 40 units in am and 30 units at bedtime   Yes Historical Provider, MD  insulin NPH (HUMULIN N,NOVOLIN N) 100 UNIT/ML injection Inject 34 Units into the skin at bedtime.    Yes Historical Provider, MD  LANOXIN 0.125 MG tablet Take 1 tablet (125 mcg total) by mouth daily. Monday thru Friday, none on Saturday and Sunday 09/17/10  Yes Cristopher Estimable. Rothbart, MD  Magnesium 100 MG TABS Take 1 tablet by mouth daily.    Yes Historical Provider, MD  pantoprazole (PROTONIX) 20 MG tablet Take 2 tablets (40 mg total) by mouth daily. 03/28/11  03/27/12 Yes Gypsy Balsam. Olin Hauser, MD  potassium chloride (KLOR-CON) 20 MEQ packet Take 20 mEq by mouth daily.     Yes Historical Provider, MD  spironolactone (ALDACTONE) 25 MG tablet Take 25 mg by mouth daily.     Yes Historical Provider, MD  warfarin (COUMADIN) 5 MG tablet Takes 2.5 mg on Sun, Mon, Wed, Fri, and Sat. Takes 5 mg on Tues and Thurs. 09/04/10  Yes Cristopher Estimable. Lattie Haw, MD    Current Outpatient Prescriptions  Medication Sig Dispense Refill  . allopurinol (ZYLOPRIM) 100 MG tablet Take 100 mg by mouth daily.        Marland Kitchen azithromycin (ZITHROMAX) 250 MG tablet Take 250 mg by mouth daily.      . benazepril (LOTENSIN) 20 MG tablet  Take 20 mg by mouth daily.        . Cholecalciferol (VITAMIN D3) 2000 UNITS capsule Take 2,000 Units by mouth daily. Take every other day       . COREG 25 MG tablet Take 1 tablet (25 mg total) by mouth 2 (two) times daily.  60 tablet  11  . furosemide (LASIX) 40 MG tablet TAKE 1 & 1/2 TABLETS BY MOUTH ONCE DAILY.  45 tablet  10  . insulin aspart (NOVOLOG) 100 UNIT/ML injection Inject 12-20 Units into the skin 3 (three) times daily. Inject 12 units at 8:00am, 16 units at lunch and 20 units at suppertime      . insulin glargine (LANTUS) 100 UNIT/ML injection Inject 30-40 Units into the skin 2 (two) times daily. Inject 40 units in am and 30 units at bedtime      . insulin NPH (HUMULIN N,NOVOLIN N) 100 UNIT/ML injection Inject 34 Units into the skin at bedtime.       Marland Kitchen LANOXIN 0.125 MG tablet Take 1 tablet (125 mcg total) by mouth daily. Monday thru Friday, none on Saturday and Sunday  20 tablet  6  . Magnesium 100 MG TABS Take 1 tablet by mouth daily.       . pantoprazole (PROTONIX) 20 MG tablet Take 2 tablets (40 mg total) by mouth daily.  30 tablet  0  . potassium chloride (KLOR-CON) 20 MEQ packet Take 20 mEq by mouth daily.        Marland Kitchen spironolactone (ALDACTONE) 25 MG tablet Take 25 mg by mouth daily.        Marland Kitchen warfarin (COUMADIN) 5 MG tablet Takes 2.5 mg on Sun, Mon, Wed, Fri, and Sat. Takes 5 mg on Tues and Thurs.        Allergies as of 04/09/2011 - Review Complete 04/09/2011  Allergen Reaction Noted  . Wheat Swelling 03/28/2011  . Latex Rash 03/28/2011  . Penicillins Rash 03/28/2011  . Sulfa antibiotics Rash 03/28/2011    Family History  Problem Relation Age of Onset  . Adopted: Yes    History   Social History  . Marital Status: Married    Spouse Name: N/A    Number of Children: 4  . Years of Education: N/A   Occupational History  .     Social History Main Topics  . Smoking status: Never Smoker   . Smokeless tobacco: Never Used  . Alcohol Use: No  . Drug Use: No  . Sexually  Active: Not on file   Other Topics Concern  . Not on file   Social History Narrative  . No narrative on file    Review of Systems: Gen: Denies fever, chills, loss of appetite, change in weight  or weight loss CV: Denies chest pain, heart palpitations, syncope, edema  Resp: Denies shortness of breath with rest, cough, wheezing GI: Denies dysphagia or odynophagia. Denies vomiting blood, jaundice, and fecal incontinence.  GU : Denies urinary burning, urinary frequency, urinary incontinence.  MS: Denies joint pain,swelling, cramping Derm: Denies rash, itching, dry skin Psych: Denies depression, anxiety,confusion, or memory loss Heme: Denies bruising, bleeding, and enlarged lymph nodes.  Physical Exam: Vital signs in last 24 hours: @VSRANGES @   General:   Alert,  Well-developed, well-nourished, pleasant and cooperative in NAD Head:  Normocephalic and atraumatic. Eyes:  Sclera clear, no icterus.   Conjunctiva pink. Ears:  Normal auditory acuity. Nose:  No deformity, discharge,  or lesions. Mouth:  No deformity or lesions, dentition normal. Neck:  Supple; no masses or thyromegaly. Lungs:  Clear throughout to auscultation.   No wheezes, crackles, or rhonchi. No acute distress. Heart:  Regular rate and rhythm; no murmurs, clicks, rubs,  or gallops. Abdomen:  Soft, nontender and nondistended. No masses, hepatosplenomegaly or hernias noted. Normal bowel sounds, without guarding, and without rebound.   Rectal:  Deferred until time of colonoscopy.   Msk:  Symmetrical without gross deformities. Normal posture. Pulses:  Normal pulses noted. Extremities:  Without clubbing or edema. Neurologic:  Alert and  oriented x4;  grossly normal neurologically. Skin:  Intact without significant lesions or rashes. Cervical Nodes:  No significant cervical adenopathy. Psych:  Alert and cooperative. Normal mood and affect.  Intake/Output from previous day:   Intake/Output this  shift: @IOTHISSHIFT @  Lab Results: No results found for this basename: WBC:3,HGB:3,HCT:3,PLT:3 in the last 72 hours BMET No results found for this basename: NA:3,K:3,CL:3,CO2:3,GLUCOSE:3,BUN:3,CREATININE:3,CALCIUM:3 in the last 72 hours LFT No results found for this basename: PROT:3,ALBUMIN:3,AST:3,ALT:3,ALKPHOS:3,BILITOT:3,BILIDIR:3,IBILI:3 in the last 72 hours PT/INR No results found for this basename: LABPROT:2,INR:2 in the last 72 hours Hepatitis Panel No results found for this basename: HEPBSAG,HCVAB,HEPAIGM,HEPBIGM in the last 72 hours C-Diff No results found for this basename: CDIFFTOX:3 in the last 72 hours  Studies/Results: No results found.  Impression: ***  Plan: ***    [name]  04/09/2011, 2:48 PM

## 2011-04-09 NOTE — Telephone Encounter (Signed)
This pt  Is being scheduled for a colonoscopy in a couple of week's with Dr. Oneida Alar. We would  like to know if Dr. Lattie Haw advises holding coumadin for 4 days prior to procedure for screening colonoscopy  And EGD. Please advise!

## 2011-04-10 ENCOUNTER — Other Ambulatory Visit: Payer: Self-pay | Admitting: Gastroenterology

## 2011-04-10 DIAGNOSIS — Z1211 Encounter for screening for malignant neoplasm of colon: Secondary | ICD-10-CM

## 2011-04-10 NOTE — Telephone Encounter (Signed)
OK to hold warfarin for 4 days prior to procedure.  Resume the evening of the procedure.

## 2011-04-10 NOTE — Telephone Encounter (Signed)
**Note De-Identified [facility] Obfuscation** Pt's last OV with Dr. Lattie Haw was on 06-06-10 and is not due to f/u until 4-13. Pt. Is taking Coumadin 2.5 mg on Sun, Mon, Wed, and Fri and 5 mg on Tues. And Thurs. Please advise./LV

## 2011-04-11 ENCOUNTER — Other Ambulatory Visit: Payer: Self-pay | Admitting: Gastroenterology

## 2011-04-11 ENCOUNTER — Ambulatory Visit: Payer: Medicaid Other | Admitting: Gastroenterology

## 2011-04-11 ENCOUNTER — Encounter: Payer: Self-pay | Admitting: Gastroenterology

## 2011-04-11 ENCOUNTER — Telehealth: Payer: Self-pay | Admitting: Gastroenterology

## 2011-04-11 ENCOUNTER — Ambulatory Visit (INDEPENDENT_AMBULATORY_CARE_PROVIDER_SITE_OTHER): Payer: Medicaid Other | Admitting: *Deleted

## 2011-04-11 DIAGNOSIS — R05 Cough: Secondary | ICD-10-CM

## 2011-04-11 DIAGNOSIS — R059 Cough, unspecified: Secondary | ICD-10-CM | POA: Insufficient documentation

## 2011-04-11 DIAGNOSIS — R1013 Epigastric pain: Secondary | ICD-10-CM

## 2011-04-11 DIAGNOSIS — I4891 Unspecified atrial fibrillation: Secondary | ICD-10-CM

## 2011-04-11 DIAGNOSIS — Z7901 Long term (current) use of anticoagulants: Secondary | ICD-10-CM

## 2011-04-11 DIAGNOSIS — R1310 Dysphagia, unspecified: Secondary | ICD-10-CM | POA: Insufficient documentation

## 2011-04-11 LAB — POCT INR: INR: 1.6

## 2011-04-11 MED ORDER — PEG-KCL-NACL-NASULF-NA ASC-C 100 G PO SOLR: 1.0000 | Freq: Once | ORAL | Status: DC

## 2011-04-11 NOTE — Assessment & Plan Note (Signed)
New onset, last 3 weeks. Not associated with eating/drinking. Intermittent nausea, one episode of vomiting. No melena. No NSAIDs, aspirin powders. Gallbladder remains in situ. Question gastritis, PUD, low-likelihood of malignancy. Proceed with EGD and consider labs/US if indicated.

## 2011-04-11 NOTE — Telephone Encounter (Signed)
Due to pt's extensive cardiac hx, let's do the following prior to EGD (Friday or first thing next week):  Labs to include: Pro BNP (printed) CBC with diff (printed) HFP (printed).  May need CXR, but we will hold off on that for now.  Pt was aware at time of appt that she may need additional labs.

## 2011-04-11 NOTE — Telephone Encounter (Signed)
Called pt. LMOM on both numbers for a return call.

## 2011-04-11 NOTE — Telephone Encounter (Signed)
Pt was informed Dr. Lattie Haw said it is OK to hold coumadin x 4 days prior to procedure. She said that [place] had said she would mail her appt and instructions after we found out about her coumadin.

## 2011-04-11 NOTE — Assessment & Plan Note (Signed)
51 year old female with need for initial screening colonoscopy. No melena, hematochezia noted. No change in bowel habits. On chronic Coumadin, has pacemaker.  Ok per cardiology to hold coumadin X 4 days (hx of afib) and resume after procedure.   Proceed with colonoscopy with Dr. Oneida Alar in the near future. The risks, benefits, and alternatives have been discussed in detail with the patient. They state understanding and desire to proceed.  EGD at time of TCS, see cough and dysphagia

## 2011-04-11 NOTE — Assessment & Plan Note (Addendum)
Several month hx of chronic cough, worse at night, some improvement with BID dosing of Protonix. Question if reflux-related. May ultimately need further work-up with PCP. Physical exam with exp wheeze. Pt not in distress. Noted intermittent dysphagia. Question uncontrolled GERD, possible esophageal web, ring, or stricture. EGD at time of TCS. Hold Coumadin X 4 days.   Continue PPI Proceed with upper endoscopy/dilation in the near future with Dr. Oneida Alar. The risks, benefits, and alternatives have been discussed in detail with patient. They have stated understanding and desire to proceed.   Addendum 2/14: Obtain CBC, Pro BNP, HFP.

## 2011-04-11 NOTE — Telephone Encounter (Signed)
Instructions mailed.

## 2011-04-11 NOTE — Assessment & Plan Note (Addendum)
Continue PPI. Proceed with EGD/ED as already outlined.

## 2011-04-11 NOTE — Progress Notes (Signed)
Referring Provider: [name], MD Primary Care Physician:  [name], MD, MD Primary Gastroenterologist:  Dr. Oneida Alar   Chief Complaint  Patient presents with  . Gastrophageal Reflux    HPI:   [name] is a 51 year old female who presents as a consult at the request of Dr. Legrand Rams for chronic cough and screening colonoscopy. Reports chronic cough, no improvement with Zpack in past. Reports dry cough, hacking, white phlegm. Coughing non-stop, sits up at night, propping pillows to keep from coughing. Does not note specific reflux or indigestion. Occasional dysphagia noted. +epigastric pain for last 3 weeks, not worse with eating/drinking. N/V one episode. Helped slightly to have a BID dosing of Protonix. No melena, hematochezia, diarrhea, constipation. Denies NSAIDs or aspirin powders.     Past Medical History  Diagnosis Date  . Chronic kidney disease     creatinine- 1.8 in 09/2006 and 2.0 in 05/2007; 2.05 in 2011, 2.29 in 2012  . Reversible ischemic neurological deficit     embolic  . Diabetes mellitus     insulin treated  . Congenital third degree heart block     Guidant VVI pacemaker implanted in 09/1997; 8generator change in 01/2004   . Chronic atrial fibrillation     embolic rind  . Cardiomyopathy     with congestive heart failure   ejection fraction of 30% in 09/2003  . Chronic anticoagulation     Past Surgical History  Procedure Date  . Insert / replace / remove pacemaker 09/1997 w/gen. change in 01/2004    Guidant VVI [employer]    Current Outpatient Prescriptions  Medication Sig Dispense Refill  . allopurinol (ZYLOPRIM) 100 MG tablet Take 100 mg by mouth daily.        Marland Kitchen azithromycin (ZITHROMAX) 250 MG tablet Take 250 mg by mouth daily.      . benazepril (LOTENSIN) 20 MG tablet Take 20 mg by mouth daily.        . Cholecalciferol (VITAMIN D3) 2000 UNITS capsule Take 2,000 Units by mouth daily. Take every other day       . COREG 25 MG tablet Take 1 tablet (25  mg total) by mouth 2 (two) times daily.  60 tablet  11  . furosemide (LASIX) 40 MG tablet TAKE 1 & 1/2 TABLETS BY MOUTH ONCE DAILY.  45 tablet  10  . insulin aspart (NOVOLOG) 100 UNIT/ML injection Inject 12-20 Units into the skin 3 (three) times daily. Inject 12 units at 8:00am, 16 units at lunch and 20 units at suppertime      . insulin glargine (LANTUS) 100 UNIT/ML injection Inject 30-40 Units into the skin 2 (two) times daily. Inject 40 units in am and 30 units at bedtime      . insulin NPH (HUMULIN N,NOVOLIN N) 100 UNIT/ML injection Inject 34 Units into the skin at bedtime.       Marland Kitchen LANOXIN 0.125 MG tablet Take 1 tablet (125 mcg total) by mouth daily. Monday thru Friday, none on Saturday and Sunday  20 tablet  6  . Magnesium 100 MG TABS Take 1 tablet by mouth daily.       . pantoprazole (PROTONIX) 20 MG tablet Take 2 tablets (40 mg total) by mouth daily.  30 tablet  0  . potassium chloride (KLOR-CON) 20 MEQ packet Take 20 mEq by mouth daily.        Marland Kitchen spironolactone (ALDACTONE) 25 MG tablet Take 25 mg by mouth daily.        Marland Kitchen warfarin (  COUMADIN) 5 MG tablet Takes 2.5 mg on Sun, Mon, Wed, Fri, and Sat. Takes 5 mg on Tues and Thurs.      . peg 3350 powder (MOVIPREP) 100 G SOLR Take 1 kit (100 g total) by mouth once. As directed Please purchase 1 Fleets enema to use with the prep  1 kit  0    Allergies as of 04/09/2011 - Review Complete 04/09/2011  Allergen Reaction Noted  . Wheat Swelling 03/28/2011  . Latex Rash 03/28/2011  . Penicillins Rash 03/28/2011  . Sulfa antibiotics Rash 03/28/2011    Family History  Problem Relation Age of Onset  . Adopted: Yes  . Colon cancer Neg Hx     History   Social History  . Marital Status: Married    Spouse Name: N/A    Number of Children: 4  . Years of Education: N/A   Occupational History  .     Social History Main Topics  . Smoking status: Never Smoker   . Smokeless tobacco: Never Used  . Alcohol Use: No  . Drug Use: No  . Sexually  Active: Not on file   Other Topics Concern  . Not on file   Social History Narrative  . No narrative on file    Review of Systems: Gen: Denies any fever, chills, loss of appetite, fatigue, weight loss. CV: Denies chest pain, heart palpitations, syncope, peripheral edema. Resp: +DOE with activity but improving GI: SEE HPI GU : Denies urinary burning, urinary frequency, urinary incontinence.  MS: Denies joint pain, muscle weakness, cramps, limited movement Derm: Denies rash, itching, dry skin Psych: Denies depression, anxiety, confusion or memory loss  Heme: Denies bruising, bleeding, and enlarged lymph nodes.  Physical Exam: BP 113/72  Pulse 69  Temp(Src) 97.4 F (36.3 C) (Temporal)  Ht 5\' 5"  (1.651 m)  Wt 190 lb 12.8 oz (86.546 kg)  BMI 31.75 kg/m2 General:   Alert and oriented. Well-developed, well-nourished, pleasant and cooperative. Head:  Normocephalic and atraumatic. Eyes:  Conjunctiva pink, sclera clear, no icterus.   Conjunctiva pink. Ears:  Normal auditory acuity. Nose:  No deformity, discharge,  or lesions. Mouth:  No deformity or lesions, mucosa pink and moist.  Neck:  Supple, without mass or thyromegaly. Lungs:  Expiratory wheeze noted bilaterally Heart:  S1, S2 present without murmurs noted.  Abdomen:  +BS, soft, non-tender and non-distended. Without mass or HSM. No rebound or guarding. No hernias noted. Rectal:  Deferred  Msk:  Symmetrical without gross deformities. Normal posture. Pulses:  Normal pulses noted. Extremities:  Without clubbing or edema. Neurologic:  Alert and  oriented x4;  grossly normal neurologically. Skin:  Intact, warm and dry without significant lesions or rashes Cervical Nodes:  No significant cervical adenopathy. Psych:  Alert and cooperative. Normal mood and affect.

## 2011-04-12 NOTE — Progress Notes (Signed)
Faxed to PCP

## 2011-04-12 NOTE — Telephone Encounter (Signed)
Pt informed to get labs. [name] she will get them first thing on Monday morning. Lab orders have been faxed to [facility].

## 2011-04-15 ENCOUNTER — Other Ambulatory Visit: Payer: Self-pay | Admitting: Gastroenterology

## 2011-04-15 ENCOUNTER — Telehealth: Payer: Self-pay

## 2011-04-15 DIAGNOSIS — R05 Cough: Secondary | ICD-10-CM

## 2011-04-15 NOTE — Telephone Encounter (Signed)
Per [facility], the order for the Pro B Natriuretic peptide was on order for [employer] [facility] [facility]. Per Laban Emperor, NP it would not print out under [facility] so I faxed a written order for the test with diagnosis of Hx heart failure and cough.

## 2011-04-16 ENCOUNTER — Other Ambulatory Visit: Payer: Self-pay | Admitting: Gastroenterology

## 2011-04-16 ENCOUNTER — Other Ambulatory Visit: Payer: Self-pay | Admitting: Cardiology

## 2011-04-16 LAB — CBC WITH DIFFERENTIAL/PLATELET
Basophils Absolute: 0 10*3/uL (ref 0.0–0.1)
Eosinophils Relative: 2 % (ref 0–5)
HCT: 43 % (ref 36.0–46.0)
Lymphocytes Relative: 36 % (ref 12–46)
Lymphs Abs: 2 10*3/uL (ref 0.7–4.0)
MCV: 81.3 fL (ref 78.0–100.0)
Neutro Abs: 3 10*3/uL (ref 1.7–7.7)
Platelets: 204 10*3/uL (ref 150–400)
RBC: 5.29 MIL/uL — ABNORMAL HIGH (ref 3.87–5.11)
WBC: 5.5 10*3/uL (ref 4.0–10.5)

## 2011-04-16 LAB — HEPATIC FUNCTION PANEL
Alkaline Phosphatase: 135 U/L — ABNORMAL HIGH (ref 39–117)
Bilirubin, Direct: 0.3 mg/dL (ref 0.0–0.3)
Indirect Bilirubin: 0.6 mg/dL (ref 0.0–0.9)
Total Protein: 7.3 g/dL (ref 6.0–8.3)

## 2011-04-16 MED ORDER — PEG-KCL-NACL-NASULF-NA ASC-C 100 G PO SOLR: 1.0000 | Freq: Once | ORAL | Status: DC

## 2011-04-17 ENCOUNTER — Encounter: Payer: Medicaid Other | Admitting: *Deleted

## 2011-04-17 ENCOUNTER — Other Ambulatory Visit: Payer: Self-pay | Admitting: *Deleted

## 2011-04-17 MED ORDER — LANOXIN 125 MCG PO TABS: 125.0000 ug | ORAL_TABLET | ORAL | Status: DC

## 2011-04-17 NOTE — Telephone Encounter (Signed)
HAD TO FAX RX TO PHARMACY DUE TO THE DAW ON THE RX AND MEDICAID.

## 2011-04-22 ENCOUNTER — Encounter (HOSPITAL_COMMUNITY): Payer: Self-pay | Admitting: Pharmacy Technician

## 2011-04-22 ENCOUNTER — Ambulatory Visit (HOSPITAL_COMMUNITY): Payer: Medicaid Other

## 2011-04-22 ENCOUNTER — Encounter: Payer: Medicaid Other | Admitting: *Deleted

## 2011-04-22 MED ORDER — SODIUM CHLORIDE 0.45 % IV SOLN
Freq: Once | INTRAVENOUS | Status: AC
  Administered 2011-04-23: 09:00:00 via INTRAVENOUS

## 2011-04-23 ENCOUNTER — Ambulatory Visit (HOSPITAL_COMMUNITY)
Admission: RE | Admit: 2011-04-23 | Discharge: 2011-04-23 | Disposition: A | Discharge status: Home / Self Care | Payer: Medicaid Other | Source: Ambulatory Visit | Attending: Gastroenterology | Admitting: Gastroenterology

## 2011-04-23 ENCOUNTER — Encounter (HOSPITAL_COMMUNITY)
Admission: RE | Disposition: A | Discharge status: Home / Self Care | Payer: Self-pay | Source: Ambulatory Visit | Attending: Gastroenterology

## 2011-04-23 ENCOUNTER — Encounter (HOSPITAL_COMMUNITY): Payer: Self-pay | Admitting: *Deleted

## 2011-04-23 DIAGNOSIS — K222 Esophageal obstruction: Secondary | ICD-10-CM

## 2011-04-23 DIAGNOSIS — K648 Other hemorrhoids: Secondary | ICD-10-CM | POA: Insufficient documentation

## 2011-04-23 DIAGNOSIS — Z1211 Encounter for screening for malignant neoplasm of colon: Secondary | ICD-10-CM | POA: Insufficient documentation

## 2011-04-23 DIAGNOSIS — D126 Benign neoplasm of colon, unspecified: Secondary | ICD-10-CM | POA: Insufficient documentation

## 2011-04-23 DIAGNOSIS — Z7901 Long term (current) use of anticoagulants: Secondary | ICD-10-CM | POA: Insufficient documentation

## 2011-04-23 DIAGNOSIS — Z01812 Encounter for preprocedural laboratory examination: Secondary | ICD-10-CM | POA: Insufficient documentation

## 2011-04-23 DIAGNOSIS — A048 Other specified bacterial intestinal infections: Secondary | ICD-10-CM | POA: Insufficient documentation

## 2011-04-23 DIAGNOSIS — K2940 Chronic atrophic gastritis without bleeding: Secondary | ICD-10-CM | POA: Insufficient documentation

## 2011-04-23 DIAGNOSIS — Z794 Long term (current) use of insulin: Secondary | ICD-10-CM | POA: Insufficient documentation

## 2011-04-23 DIAGNOSIS — R1310 Dysphagia, unspecified: Secondary | ICD-10-CM | POA: Insufficient documentation

## 2011-04-23 DIAGNOSIS — K2970 Gastritis, unspecified, without bleeding: Secondary | ICD-10-CM

## 2011-04-23 DIAGNOSIS — E119 Type 2 diabetes mellitus without complications: Secondary | ICD-10-CM | POA: Insufficient documentation

## 2011-04-23 DIAGNOSIS — K2990 Gastroduodenitis, unspecified, without bleeding: Secondary | ICD-10-CM

## 2011-04-23 HISTORY — PX: COLONOSCOPY: SHX5424

## 2011-04-23 HISTORY — DX: Cerebral infarction, unspecified: I63.9

## 2011-04-23 SURGERY — COLONOSCOPY
Anesthesia: Moderate Sedation

## 2011-04-23 MED ORDER — MIDAZOLAM HCL 5 MG/5ML IJ SOLN
INTRAMUSCULAR | Status: DC | PRN
  Administered 2011-04-23: 1 mg via INTRAVENOUS
  Administered 2011-04-23: 2 mg via INTRAVENOUS

## 2011-04-23 MED ORDER — MINERAL OIL PO OIL
TOPICAL_OIL | ORAL | Status: AC
  Filled 2011-04-23: qty 30

## 2011-04-23 MED ORDER — MEPERIDINE HCL 100 MG/ML IJ SOLN
INTRAMUSCULAR | Status: AC
  Filled 2011-04-23: qty 2

## 2011-04-23 MED ORDER — BUTAMBEN-TETRACAINE-BENZOCAINE 2-2-14 % EX AERO
INHALATION_SPRAY | CUTANEOUS | Status: DC | PRN
  Administered 2011-04-23: 2 via TOPICAL

## 2011-04-23 MED ORDER — FENTANYL CITRATE 0.05 MG/ML IJ SOLN
INTRAMUSCULAR | Status: AC
  Filled 2011-04-23: qty 2

## 2011-04-23 MED ORDER — FENTANYL CITRATE 0.05 MG/ML IJ SOLN
INTRAMUSCULAR | Status: DC | PRN
  Administered 2011-04-23: 25 ug via INTRAVENOUS
  Administered 2011-04-23: 4310 ug via INTRAVENOUS

## 2011-04-23 MED ORDER — MIDAZOLAM HCL 5 MG/5ML IJ SOLN
INTRAMUSCULAR | Status: AC
  Filled 2011-04-23: qty 10

## 2011-04-23 NOTE — H&P (View-Only) (Signed)
Referring Provider: [name], MD Primary Care Physician:  [name], MD, MD Primary Gastroenterologist:  Dr. Oneida Alar   Chief Complaint  Patient presents with  . Gastrophageal Reflux    HPI:   [name] is a 51 year old female who presents as a consult at the request of Dr. Legrand Rams for chronic cough and screening colonoscopy. Reports chronic cough, no improvement with Zpack in past. Reports dry cough, hacking, white phlegm. Coughing non-stop, sits up at night, propping pillows to keep from coughing. Does not note specific reflux or indigestion. Occasional dysphagia noted. +epigastric pain for last 3 weeks, not worse with eating/drinking. N/V one episode. Helped slightly to have a BID dosing of Protonix. No melena, hematochezia, diarrhea, constipation. Denies NSAIDs or aspirin powders.     Past Medical History  Diagnosis Date  . Chronic kidney disease     creatinine- 1.8 in 09/2006 and 2.0 in 05/2007; 2.05 in 2011, 2.29 in 2012  . Reversible ischemic neurological deficit     embolic  . Diabetes mellitus     insulin treated  . Congenital third degree heart block     Guidant VVI pacemaker implanted in 09/1997; 8generator change in 01/2004   . Chronic atrial fibrillation     embolic rind  . Cardiomyopathy     with congestive heart failure   ejection fraction of 30% in 09/2003  . Chronic anticoagulation     Past Surgical History  Procedure Date  . Insert / replace / remove pacemaker 09/1997 w/gen. change in 01/2004    Guidant VVI [employer]    Current Outpatient Prescriptions  Medication Sig Dispense Refill  . allopurinol (ZYLOPRIM) 100 MG tablet Take 100 mg by mouth daily.        Marland Kitchen azithromycin (ZITHROMAX) 250 MG tablet Take 250 mg by mouth daily.      . benazepril (LOTENSIN) 20 MG tablet Take 20 mg by mouth daily.        . Cholecalciferol (VITAMIN D3) 2000 UNITS capsule Take 2,000 Units by mouth daily. Take every other day       . COREG 25 MG tablet Take 1 tablet (25  mg total) by mouth 2 (two) times daily.  60 tablet  11  . furosemide (LASIX) 40 MG tablet TAKE 1 & 1/2 TABLETS BY MOUTH ONCE DAILY.  45 tablet  10  . insulin aspart (NOVOLOG) 100 UNIT/ML injection Inject 12-20 Units into the skin 3 (three) times daily. Inject 12 units at 8:00am, 16 units at lunch and 20 units at suppertime      . insulin glargine (LANTUS) 100 UNIT/ML injection Inject 30-40 Units into the skin 2 (two) times daily. Inject 40 units in am and 30 units at bedtime      . insulin NPH (HUMULIN N,NOVOLIN N) 100 UNIT/ML injection Inject 34 Units into the skin at bedtime.       Marland Kitchen LANOXIN 0.125 MG tablet Take 1 tablet (125 mcg total) by mouth daily. Monday thru Friday, none on Saturday and Sunday  20 tablet  6  . Magnesium 100 MG TABS Take 1 tablet by mouth daily.       . pantoprazole (PROTONIX) 20 MG tablet Take 2 tablets (40 mg total) by mouth daily.  30 tablet  0  . potassium chloride (KLOR-CON) 20 MEQ packet Take 20 mEq by mouth daily.        Marland Kitchen spironolactone (ALDACTONE) 25 MG tablet Take 25 mg by mouth daily.        Marland Kitchen warfarin (  COUMADIN) 5 MG tablet Takes 2.5 mg on Sun, Mon, Wed, Fri, and Sat. Takes 5 mg on Tues and Thurs.      . peg 3350 powder (MOVIPREP) 100 G SOLR Take 1 kit (100 g total) by mouth once. As directed Please purchase 1 Fleets enema to use with the prep  1 kit  0    Allergies as of 04/09/2011 - Review Complete 04/09/2011  Allergen Reaction Noted  . Wheat Swelling 03/28/2011  . Latex Rash 03/28/2011  . Penicillins Rash 03/28/2011  . Sulfa antibiotics Rash 03/28/2011    Family History  Problem Relation Age of Onset  . Adopted: Yes  . Colon cancer Neg Hx     History   Social History  . Marital Status: Married    Spouse Name: N/A    Number of Children: 4  . Years of Education: N/A   Occupational History  .     Social History Main Topics  . Smoking status: Never Smoker   . Smokeless tobacco: Never Used  . Alcohol Use: No  . Drug Use: No  . Sexually  Active: Not on file   Other Topics Concern  . Not on file   Social History Narrative  . No narrative on file    Review of Systems: Gen: Denies any fever, chills, loss of appetite, fatigue, weight loss. CV: Denies chest pain, heart palpitations, syncope, peripheral edema. Resp: +DOE with activity but improving GI: SEE HPI GU : Denies urinary burning, urinary frequency, urinary incontinence.  MS: Denies joint pain, muscle weakness, cramps, limited movement Derm: Denies rash, itching, dry skin Psych: Denies depression, anxiety, confusion or memory loss  Heme: Denies bruising, bleeding, and enlarged lymph nodes.  Physical Exam: BP 113/72  Pulse 69  Temp(Src) 97.4 F (36.3 C) (Temporal)  Ht 5\' 5"  (1.651 m)  Wt 190 lb 12.8 oz (86.546 kg)  BMI 31.75 kg/m2 General:   Alert and oriented. Well-developed, well-nourished, pleasant and cooperative. Head:  Normocephalic and atraumatic. Eyes:  Conjunctiva pink, sclera clear, no icterus.   Conjunctiva pink. Ears:  Normal auditory acuity. Nose:  No deformity, discharge,  or lesions. Mouth:  No deformity or lesions, mucosa pink and moist.  Neck:  Supple, without mass or thyromegaly. Lungs:  Expiratory wheeze noted bilaterally Heart:  S1, S2 present without murmurs noted.  Abdomen:  +BS, soft, non-tender and non-distended. Without mass or HSM. No rebound or guarding. No hernias noted. Rectal:  Deferred  Msk:  Symmetrical without gross deformities. Normal posture. Pulses:  Normal pulses noted. Extremities:  Without clubbing or edema. Neurologic:  Alert and  oriented x4;  grossly normal neurologically. Skin:  Intact, warm and dry without significant lesions or rashes Cervical Nodes:  No significant cervical adenopathy. Psych:  Alert and cooperative. Normal mood and affect.

## 2011-04-23 NOTE — Op Note (Addendum)
[facility] [address] [place], [place]  [address]  DR. FIELD'S EGDdTCS PROCEDURE REPORT  PATIENT:  Toni, [name]  MR#:  [id number] BIRTHDATE:  Apr 04, 1960, 50 yrs. old  GENDER:  female  ENDOSCOPIST:  Barney Drain, MD ASSISTANT: Referred by:  Conni Slipper, M.D.  PROCEDURE DATE:  04/23/2011 PROCEDURE:  TCS with biopsy, EGD with biopsy for H. pylori, EGD with dilatation over guidewire ASA CLASS: INDICATIONS:  DYSPHAGIA, FELT LIKE SOMETHING IN HER THROAT, SCREENING  MEDICATIONS:   Fentanyl 75 mcg IV, Versed 3 mg IV TOPICAL ANESTHETIC:  Cetacaine Spray  DESCRIPTION OF PROCEDURE:   After the risks benefits and alternatives of the procedure were thoroughly explained, informed consent was obtained.  The EC-3890Li [id number] and [id number]) endoscope was introduced through the mouth and advanced to the second portion of the duodenum.  The instrument was slowly withdrawn as the mucosa was carefully examined.  Prior to withdrawal of the scope, the guidwire was placed.  The esophagus was dilated successfully.  After administration of sedation and rectal exam, the patient's rectum was intubated and the [id number] [id number]) and [id number]) colonoscope was advanced under direct visualization to the cecum.  The scope was removed slowly by carefully examining the color, texture, anatomy, and integrity mucosa on the way out. The patient was recovered in endoscopy and discharged home in satisfactory condition.  The patient was recovered in endoscopy and discharged home in satisfactory condition.  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  Mild gastritis was found & BIOPSIED VIA COLD FORCEPS.    Dilation was then performed at the distal esophagus. NL DUODENUM. NO BARRETT'S. 3 MM SESSILE DESCENDING COLON POLYP REMOVED VIA COLDFORCEPS. SMALL INTERNAL HEMORRHOIDS.  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 REVISED:  04/23/2011 03:52 PM eSIGNED:   Fedor Kazmierski at 04/23/2011 03:52 PM  [name], [id number]

## 2011-04-23 NOTE — Progress Notes (Signed)
REVIEWED.  

## 2011-04-23 NOTE — Interval H&P Note (Signed)
History and Physical Interval Note:  04/23/2011 9:24 AM  [name]  has presented today for surgery, with the diagnosis of screening, dysphagia  The various methods of treatment have been discussed with the patient and family. After consideration of risks, benefits and other options for treatment, the patient has consented to  Procedure(s) (LRB): COLONOSCOPY (N/A) ESOPHAGOGASTRODUODENOSCOPY (EGD) WITH ESOPHAGEAL DILATION (N/A) as a surgical intervention .  The patients' history has been reviewed, patient examined, no change in status, stable for surgery.  I have reviewed the patients' chart and labs.  Questions were answered to the patient's satisfaction.     [employer]

## 2011-04-23 NOTE — Discharge Instructions (Signed)
You had 1 small polyps removed. You have internal hemorrhoids. You had a ring at the base of your esophagus. I dilated your esophagus TO ADDRESS YOUR OCCASIONAL PROBLEM SWALLOWING. You have mild gastritis. I biopsied your stomach.  Continue PROTONIX. RESTART COUMADIN Apr 26 2011. Next colonoscopy in 10 years. FOLLOW A HIGH FIBER DIET. AVOID ITEMS THAT CAUSE BLOATING. SEE INFO BELOW. YOUR BIOPSY RESULTS SHOULD BE BACK IN 7 DAYS. FOLLOW UP IN 3 MOS.  ENDOSCOPY Care After Read the instructions outlined below and refer to this sheet in the next week. These discharge instructions provide you with general information on caring for yourself after you leave the hospital. While your treatment has been planned according to the most current medical practices available, unavoidable complications occasionally occur. If you have any problems or questions after discharge, call DR. Jericka Kadar, [phone].  ACTIVITY  You may resume your regular activity, but move at a slower pace for the next 24 hours.   Take frequent rest periods for the next 24 hours.   Walking will help get rid of the air and reduce the bloated feeling in your belly (abdomen).   No driving for 24 hours (because of the medicine (anesthesia) used during the test).   You may shower.   Do not sign any important legal documents or operate any machinery for 24 hours (because of the anesthesia used during the test).    NUTRITION  Drink plenty of fluids.   You may resume your normal diet as instructed by your doctor.   Begin with a light meal and progress to your normal diet. Heavy or fried foods are harder to digest and may make you feel sick to your stomach (nauseated).   Avoid alcoholic beverages for 24 hours or as instructed.    MEDICATIONS  You may resume your normal medications.   WHAT YOU CAN EXPECT TODAY  Some feelings of bloating in the abdomen.   Passage of more gas than usual.   Spotting of blood in your stool or  on the toilet paper  .  IF YOU HAD POLYPS REMOVED DURING THE ENDOSCOPY:  Eat a soft diet IF YOU HAVE NAUSEA, BLOATING, ABDOMINAL PAIN, OR VOMITING.    FINDING OUT THE RESULTS OF YOUR TEST Not all test results are available during your visit. DR. Oneida Alar WILL CALL YOU WITHIN 7 DAYS OF YOUR PROCEDUE WITH YOUR RESULTS. Do not assume everything is normal if you have not heard from DR. Kelissa Merlin IN ONE WEEK, CALL HER OFFICE AT [phone].  SEEK IMMEDIATE MEDICAL ATTENTION AND CALL THE OFFICE: [phone] IF:  You have more than a spotting of blood in your stool.   Your belly is swollen (abdominal distention).   You are nauseated or vomiting.   You have a temperature over 101F.   You have abdominal pain or discomfort that is severe or gets worse throughout the day.   Gastritis  Gastritis is an inflammation (the body's way of reacting to injury and/or infection) of the stomach. It is often caused by viral or bacterial (germ) infections. It can also be caused BY ASPIRIN, BC/GOODY POWDER'S, (IBUPROFEN) MOTRIN, OR ALEVE (NAPROXEN), chemicals (including alcohol), SPICY FOODS, and medications. This illness may be associated with generalized malaise (feeling tired, not well), UPPER ABDOMINAL STOMACH cramps, and fever. One common bacterial cause of gastritis is an organism known as H. Pylori. This can be treated with antibiotics.   Polyps, Colon  A polyp is extra tissue that grows inside your body. Colon polyps grow  in the large intestine. The large intestine, also called the colon, is part of your digestive system. It is a long, hollow tube at the end of your digestive tract where your body makes and stores stool. Most polyps are not dangerous. They are benign. This means they are not cancerous. But over time, some types of polyps can turn into cancer. Polyps that are smaller than a pea are usually not harmful. But larger polyps could someday become or may already be cancerous. To be safe, doctors  remove all polyps and test them.   WHO GETS POLYPS? Anyone can get polyps, but certain people are more likely than others. You may have a greater chance of getting polyps if:  You are over 50.   You have had polyps before.   Someone in your family has had polyps.   Someone in your family has had cancer of the large intestine.   Find out if someone in your family has had polyps. You may also be more likely to get polyps if you:   Eat a lot of fatty foods   Smoke   Drink alcohol   Do not exercise  Eat too much   TREATMENT  The caregiver will remove the polyp during sigmoidoscopy or colonoscopy.  PREVENTION There is not one sure way to prevent polyps. You might be able to lower your risk of getting them if you:  Eat more fruits and vegetables and less fatty food.   Do not smoke.   Avoid alcohol.   Exercise every day.   Lose weight if you are overweight.   Eating more calcium and folate can also lower your risk of getting polyps. Some foods that are rich in calcium are milk, cheese, and broccoli. Some foods that are rich in folate are chickpeas, kidney beans, and spinach.   High-Fiber Diet A high-fiber diet changes your normal diet to include more whole grains, legumes, fruits, and vegetables. Changes in the diet involve replacing refined carbohydrates with unrefined foods. The calorie level of the diet is essentially unchanged. The Dietary Reference Intake (recommended amount) for adult males is 38 grams per day. For adult females, it is 25 grams per day. Pregnant and lactating women should consume 28 grams of fiber per day. Fiber is the intact part of a plant that is not broken down during digestion. Functional fiber is fiber that has been isolated from the plant to provide a beneficial effect in the body. PURPOSE  Increase stool bulk.   Ease and regulate bowel movements.   Lower cholesterol.  INDICATIONS THAT YOU NEED MORE FIBER  Constipation and hemorrhoids.    Uncomplicated diverticulosis (intestine condition) and irritable bowel syndrome.   Weight management.   As a protective measure against hardening of the arteries (atherosclerosis), diabetes, and cancer.   GUIDELINES FOR INCREASING FIBER IN THE DIET  Start adding fiber to the diet slowly. A gradual increase of about 5 more grams (2 slices of whole-wheat bread, 2 servings of most fruits or vegetables, or 1 bowl of high-fiber cereal) per day is best. Too rapid an increase in fiber may result in constipation, flatulence, and bloating.   Drink enough water and fluids to keep your urine clear or pale yellow. Water, juice, or caffeine-free drinks are recommended. Not drinking enough fluid may cause constipation.   Eat a variety of high-fiber foods rather than one type of fiber.   Try to increase your intake of fiber through using high-fiber foods rather than fiber pills or  supplements that contain small amounts of fiber.   The goal is to change the types of food eaten. Do not supplement your present diet with high-fiber foods, but replace foods in your present diet.  INCLUDE A VARIETY OF FIBER SOURCES  Replace refined and processed grains with whole grains, canned fruits with fresh fruits, and incorporate other fiber sources. White rice, white breads, and most bakery goods contain little or no fiber.   Brown whole-grain rice, buckwheat oats, and many fruits and vegetables are all good sources of fiber. These include: broccoli, Brussels sprouts, cabbage, cauliflower, beets, sweet potatoes, white potatoes (skin on), carrots, tomatoes, eggplant, squash, berries, fresh fruits, and dried fruits.   Cereals appear to be the richest source of fiber. Cereal fiber is found in whole grains and bran. Bran is the fiber-rich outer coat of cereal grain, which is largely removed in refining. In whole-grain cereals, the bran remains. In breakfast cereals, the largest amount of fiber is found in those with "bran"  in their names. The fiber content is sometimes indicated on the label.   You may need to include additional fruits and vegetables each day.   In baking, for 1 cup white flour, you may use the following substitutions:   1 cup whole-wheat flour minus 2 tablespoons.   1/2 cup white flour plus 1/2 cup whole-wheat flour.   Hemorrhoids Hemorrhoids are dilated (enlarged) veins around the rectum. Sometimes clots will form in the veins. This makes them swollen and painful. These are called thrombosed hemorrhoids. Causes of hemorrhoids include:  Constipation.   Straining to have a bowel movement.   HEAVY LIFTING HOME CARE INSTRUCTIONS  Eat a well balanced diet and drink 6 to 8 glasses of water every day to avoid constipation. You may also use a bulk laxative.   Avoid straining to have bowel movements.   Keep anal area dry and clean.   Do not use a donut shaped pillow or sit on the toilet for long periods. This increases blood pooling and pain.   Move your bowels when your body has the urge; this will require less straining and will decrease pain and pressure.

## 2011-04-29 ENCOUNTER — Telehealth: Payer: Self-pay | Admitting: *Deleted

## 2011-04-29 ENCOUNTER — Other Ambulatory Visit: Payer: Self-pay | Admitting: *Deleted

## 2011-04-29 ENCOUNTER — Ambulatory Visit (HOSPITAL_COMMUNITY)
Admission: RE | Admit: 2011-04-29 | Discharge: 2011-04-29 | Disposition: A | Discharge status: Home / Self Care | Payer: Medicaid Other | Source: Ambulatory Visit | Attending: Adult Health | Admitting: Adult Health

## 2011-04-29 ENCOUNTER — Telehealth: Payer: Self-pay

## 2011-04-29 DIAGNOSIS — R0602 Shortness of breath: Secondary | ICD-10-CM | POA: Insufficient documentation

## 2011-04-29 DIAGNOSIS — I672 Cerebral atherosclerosis: Secondary | ICD-10-CM | POA: Insufficient documentation

## 2011-04-29 LAB — BRAIN NATRIURETIC PEPTIDE: Brain Natriuretic Peptide: 4835 pg/mL — ABNORMAL HIGH (ref 0.0–100.0)

## 2011-04-29 LAB — BASIC METABOLIC PANEL
CO2: 27 mEq/L (ref 19–32)
Chloride: 99 mEq/L (ref 96–112)
Creat: 2.28 mg/dL — ABNORMAL HIGH (ref 0.50–1.10)

## 2011-04-29 NOTE — Telephone Encounter (Signed)
Labs ordered by Laban Emperor & were suppose to be done prior to EGD/DIL.  REVIEWED.

## 2011-04-29 NOTE — Telephone Encounter (Signed)
Pt returned call. Said she has not had significant SOB. She was made aware of the lab results and that she should see her PCP or Cardiologist within 24 hours. She is aware that lab results were faxed to both. She will call and have them call if they have questions.

## 2011-04-29 NOTE — Telephone Encounter (Signed)
I called lab for results of Pro-B type Natriuretic Peptide. They were faxed over. Results were 4309.00 H. Spoke with Neil Crouch, PA in Dr. Oneida Alar absence. She said to call the pt and tell her this lab indicates that she has too much fluid and to see her PCP or Cardiologist within 24 hours. I called and lmom for a return call as soon as she gets message. I am faxing results to Dr. Legrand Rams and to [facility].

## 2011-04-29 NOTE — Telephone Encounter (Signed)
Received BNP result drawn on 2/18 today of 4309.00.  Pt states SOB and is worried about this value.  Advised her obtain chest xray today and repeat lab work, since this is 11 weeks old.  Will see Jory Sims tomorrow for evaluation.

## 2011-04-30 ENCOUNTER — Other Ambulatory Visit: Payer: Self-pay

## 2011-04-30 ENCOUNTER — Encounter (HOSPITAL_COMMUNITY): Payer: Self-pay | Admitting: Gastroenterology

## 2011-04-30 ENCOUNTER — Ambulatory Visit (INDEPENDENT_AMBULATORY_CARE_PROVIDER_SITE_OTHER): Payer: Medicaid Other | Admitting: Adult Health

## 2011-04-30 DIAGNOSIS — Q246 Congenital heart block: Secondary | ICD-10-CM

## 2011-04-30 DIAGNOSIS — I428 Other cardiomyopathies: Secondary | ICD-10-CM

## 2011-04-30 DIAGNOSIS — I4891 Unspecified atrial fibrillation: Secondary | ICD-10-CM

## 2011-04-30 NOTE — Telephone Encounter (Signed)
Quick Note:  Mild bump in AP. Hx of HF, may be contributor. Pt underwent EGD prior to BNP results returning. BNP elevated, to see cardiac, already scheduled.  Recheck AP in 6 weeks, fasting. Return to see [place] in 3 mos. ______

## 2011-04-30 NOTE — Patient Instructions (Signed)
Your physician recommends that you schedule a follow-up appointment in:  1 - [name] on Monday 2 - Dr Lovena Le in May  Your physician has recommended you make the following change in your medication:  1 - Increase Lasix to 40 mg twice a day for 3 days 2 - Take extra dose of potassium with extra lasix dose  Your physician recommends that you return for lab work in: Friday  Your physician has requested that you have an echocardiogram. Echocardiography is a painless test that uses sound waves to create images of your heart. It provides your doctor with information about the size and shape of your heart and how well your heart's chambers and valves are working. This procedure takes approximately one hour. There are no restrictions for this procedure.

## 2011-04-30 NOTE — Telephone Encounter (Signed)
Quick Note:  Pt informed. Lab order on file for 06/11/2011. Pt is aware that she will need to be fasting, and it was also written on the order. ______

## 2011-04-30 NOTE — Assessment & Plan Note (Signed)
She sees Dr. Lovena Le in May for face to face pacemaker interrogation.

## 2011-04-30 NOTE — Assessment & Plan Note (Signed)
Last echo was completed in May of 2010. I will repeat this.  Will increase lasix from 60 mg daily to 80 mg daily for 3 days, with additional potassium replacement. I will be careful with this because she is on spironolactone.as well. BMET in 3 days. She is to weigh herself daily and hopefully lose about 3-4 lbs of fluid. I will see her in one week for re-evaluation.

## 2011-04-30 NOTE — Telephone Encounter (Signed)
As documented

## 2011-04-30 NOTE — Progress Notes (Signed)
HPI:[name] is a 51 y/o patient of Dr. Lattie Haw we are following for ongoing assessment and treatment of systolic dysfunction/nonischemic cardiomyopathy with EF of 20-25%, atrial fibrillation, SSS s/p  Guidant Horris Latino SR model [id number], serial number [id number] in 2005, with history of diabetes. She comes today because of abnormal labs drawn by Dr. Oneida Alar prior to a colonoscopy and EGD. She was found to have elevated BNP of 4835. She has gained about 5lbs and has mild DOE, no swelling and no PND. She is quiet upset by this and wonders if she is back in CHF. She denies chest pain. She does not weigh daily and tries to avoid salt. She was seen in the ER last month for shortness of breath and was given IV fluids. EGD/colonscopy also last month with dilation of the esophagus.   Allergies  Allergen Reactions  . Wheat Swelling  . Latex Rash  . Penicillins Rash  . Sulfa Antibiotics Rash    Current Outpatient Prescriptions  Medication Sig Dispense Refill  . allopurinol (ZYLOPRIM) 100 MG tablet Take 100 mg by mouth daily.        . benazepril (LOTENSIN) 20 MG tablet Take 20 mg by mouth daily.        . carvedilol (COREG) 25 MG tablet Take 25 mg by mouth 2 (two) times daily with a meal.      . cholecalciferol (VITAMIN D) 1000 UNITS tablet Take 1,000 Units by mouth daily.      . digoxin (LANOXIN) 0.125 MG tablet Take 125 mcg by mouth as directed. Patient takes Monday thru Friday, not taking on Saturdays and Sundays      . furosemide (LASIX) 40 MG tablet TAKE 1 & 1/2 TABLETS BY MOUTH ONCE DAILY.  45 tablet  10  . insulin aspart (NOVOLOG) 100 UNIT/ML injection Inject 12-20 Units into the skin 3 (three) times daily. Inject 12 units at 8:00am, 16 units at lunch and 20 units at suppertime      . insulin glargine (LANTUS) 100 UNIT/ML injection Inject 30-40 Units into the skin 2 (two) times daily. Inject 40 units in am and 30 units at bedtime      . insulin NPH (HUMULIN N,NOVOLIN N) 100 UNIT/ML injection  Inject 34 Units into the skin at bedtime.       . Magnesium 250 MG TABS Take 1 tablet by mouth daily.      . pantoprazole (PROTONIX) 40 MG tablet Take 40 mg by mouth 2 (two) times daily.      . potassium chloride SA (K-DUR,KLOR-CON) 20 MEQ tablet Take 20 mEq by mouth daily.      Marland Kitchen spironolactone (ALDACTONE) 25 MG tablet Take 25 mg by mouth daily.        Marland Kitchen warfarin (COUMADIN) 5 MG tablet Take 5 mg by mouth daily. Take as directed        Past Medical History  Diagnosis Date  . Chronic kidney disease     creatinine- 1.8 in 09/2006 and 2.0 in 05/2007; 2.05 in 2011, 2.29 in 2012  . Reversible ischemic neurological deficit     embolic  . Diabetes mellitus     insulin treated  . Congenital third degree heart block     Guidant VVI pacemaker implanted in 09/1997; 8generator change in 01/2004   . Chronic atrial fibrillation     embolic rind  . Cardiomyopathy     with congestive heart failure   ejection fraction of 30% in 09/2003  . Chronic anticoagulation   .  Stroke 1999  . CHF (congestive heart failure)     Past Surgical History  Procedure Date  . Insert / replace / remove pacemaker 09/1997 w/gen. change in 01/2004    Guidant VVI [employer]  . Colonoscopy 04/23/2011    Procedure: COLONOSCOPY;  Surgeon: Dorothyann Peng, MD;  Location: [facility];  Service: Endoscopy;  Laterality: N/A;  12:30    [id number] of systems complete and found to be negative unless listed above  PHYSICAL EXAM BP 129/78  Pulse 62  Ht 5\' 5"  (1.651 m)  Wt 191 lb 1.3 oz (86.673 kg)  BMI 31.80 kg/m2  General: Well developed, well nourished, in no acute distress Head: Eyes PERRLA, No xanthomas.   Normal cephalic and atramatic  Lungs: Clear bilaterally to auscultation and percussion, but mild crackles in the bases. Heart: HRRR S1 S2, 2/6 systolic murmur, heard best over the LSB  Pulses are 2+ & equal.            No carotid bruit. No JVD.  No abdominal bruits. No femoral bruits. Abdomen: Bowel sounds are  positive, abdomen soft and non-tender without masses or                  Hernia's noted. Msk:  Back normal, normal gait. Normal strength and tone for age. Extremities: No clubbing, cyanosis or edema.  DP +1, right leg diminished pulses. Neuro: Alert and oriented X 3. Psych:   Tearful  affect, responds appropriately  EGD: Ventricular pacing with atrial flutter. Rate of 60 bpm.:  ASSESSMENT AND PLAN

## 2011-05-01 ENCOUNTER — Other Ambulatory Visit: Payer: Self-pay | Admitting: *Deleted

## 2011-05-01 ENCOUNTER — Telehealth: Payer: Self-pay

## 2011-05-01 ENCOUNTER — Telehealth: Payer: Self-pay | Admitting: Gastroenterology

## 2011-05-01 DIAGNOSIS — I4891 Unspecified atrial fibrillation: Secondary | ICD-10-CM

## 2011-05-01 MED ORDER — BIS SUBCIT-METRONID-TETRACYC 140-125-125 MG PO CAPS: 3.0000 | ORAL_CAPSULE | Freq: Three times a day (TID) | ORAL | Status: DC

## 2011-05-01 NOTE — Telephone Encounter (Signed)
Pharmacist from [place] [facility] called. Pt will need PA on Pylera. She is faxing over the info needed.

## 2011-05-01 NOTE — Telephone Encounter (Signed)
Addended by: Danie Binder on: 05/01/2011 01:44 PM   Modules accepted: Orders

## 2011-05-01 NOTE — Telephone Encounter (Signed)
PA form filled out and faxed to medicaid.

## 2011-05-01 NOTE — Progress Notes (Signed)
**Note De-Identified Ramell Wacha Obfuscation** Addended by: Dennie Fetters on: 05/01/2011 04:54 PM   Modules accepted: Orders

## 2011-05-01 NOTE — Telephone Encounter (Signed)
REVIEWED. AGREE. 

## 2011-05-01 NOTE — Telephone Encounter (Signed)
Pt has medicaid and prepac is preferred with medicaid. Pt is allergic to PCN. PA paperwork has been started and we will try to get pylera for pt. Informed SLF. She asked that I call pt and make sure she is aware that she is not to adjust her coumadin and bloodwork  until she starts abx. Spoke with pt, she is aware that we are working on PA for medication and will call her when it is approved. She will not adjust coumadin until she starts abx.

## 2011-05-01 NOTE — Telephone Encounter (Addendum)
CALLED PT. She has H. Pylori gastritis. She has an allergy to PCN and so she needs PYLERA.  1. TAKE PYLERA 3 PILLS FOUR TIMES A DAY FOR 10 DAYS. 2. TAKE OMEPRAZOLE TWICE DAILY FOR 10 DAYS THEN ONCE DAILY. The meds can cause nausea, vomiting, abd cramps, loose stools, black colored stools, and metallic taste in her mouth. 3. 3. METRONIDAZOLE MAY CAUSE HER COUMADIN EFFECT TO BE MORE POWERFUL.  SO TAKE 2.5 MG COUMADIN ALTERNATING WITH 5 MG  OF COUMADIN WHILE TAKING THE ANTIBIOTICS.  4. INR ON FRI MAR 8. REPEAT INR ON MAR 11 OR MAR 12.    She had simple adenomas removed from her colon. TCS in 10 years. High fiber diet.  FOLLOW UP IN MAY 2013.

## 2011-05-01 NOTE — Telephone Encounter (Signed)
Results Cc to PCP  

## 2011-05-03 ENCOUNTER — Ambulatory Visit (HOSPITAL_COMMUNITY)
Admission: RE | Admit: 2011-05-03 | Discharge: 2011-05-03 | Disposition: A | Discharge status: Home / Self Care | Payer: Medicaid Other | Source: Ambulatory Visit | Attending: Cardiology | Admitting: Cardiology

## 2011-05-03 DIAGNOSIS — E119 Type 2 diabetes mellitus without complications: Secondary | ICD-10-CM | POA: Insufficient documentation

## 2011-05-03 DIAGNOSIS — I428 Other cardiomyopathies: Secondary | ICD-10-CM

## 2011-05-03 DIAGNOSIS — I4891 Unspecified atrial fibrillation: Secondary | ICD-10-CM | POA: Insufficient documentation

## 2011-05-03 DIAGNOSIS — I369 Nonrheumatic tricuspid valve disorder, unspecified: Secondary | ICD-10-CM

## 2011-05-03 NOTE — Telephone Encounter (Signed)
PA approved by [facility] and faxed to pharmacy.

## 2011-05-03 NOTE — Telephone Encounter (Signed)
Called pt- LMOM 

## 2011-05-03 NOTE — Progress Notes (Signed)
*  PRELIMINARY RESULTS* Echocardiogram 2D Echocardiogram has been performed.  [name] 05/03/2011, 8:22 AM

## 2011-05-06 ENCOUNTER — Ambulatory Visit: Payer: Medicaid Other | Admitting: Adult Health

## 2011-05-08 ENCOUNTER — Ambulatory Visit (INDEPENDENT_AMBULATORY_CARE_PROVIDER_SITE_OTHER): Payer: Medicaid Other | Admitting: *Deleted

## 2011-05-08 DIAGNOSIS — I4891 Unspecified atrial fibrillation: Secondary | ICD-10-CM

## 2011-05-08 DIAGNOSIS — Z7901 Long term (current) use of anticoagulants: Secondary | ICD-10-CM

## 2011-05-08 NOTE — Telephone Encounter (Signed)
Reminder in epic to follow up in May 2013 and tcs in 10 years

## 2011-05-10 ENCOUNTER — Telehealth: Payer: Self-pay | Admitting: *Deleted

## 2011-05-10 NOTE — Telephone Encounter (Signed)
Patient walked into office today stating that she still feels like she is fluid overloaded.  Weight is 193.12 today from 191.8 on 3/5.  Patient was seen in the office 3/5 and instructions given per direction of Jory Sims, NP.  Patient was to have repeat BNP and BMET on the 8th, which were not done, so that she could continue to be managed further.  In addition, had an appt to follow up with Curt Bears on the 11th, which she cancelled due to the fact that she wanted to see Dr Lattie Haw.  Appt was made for first available with him at that time and patient was encouraged to do lab work as directed.  Patient walked into office on Wednesday the 13th complaining that she felt like she had fluid in her ears and still did not feel well.  I again advised her to obtain lab work and reiterated to her, as was explained at office visit, that this could be coming from her condition and advised her to be seen in the ED if she was still having symptoms.  Stated that she would obtain lab work that day and just follow up with Dr Lattie Haw as planned.  Husband and patient made aware today that her condition needed to be treated and encouraged her to be seen in the ED.  Husband stated that he would just take her to Dr Josephine Cables office to "have this dealt with".  Lab work was done prior to arrival to office today.

## 2011-05-13 ENCOUNTER — Ambulatory Visit (INDEPENDENT_AMBULATORY_CARE_PROVIDER_SITE_OTHER): Payer: Medicaid Other | Admitting: *Deleted

## 2011-05-13 DIAGNOSIS — Z7901 Long term (current) use of anticoagulants: Secondary | ICD-10-CM

## 2011-05-13 DIAGNOSIS — I4891 Unspecified atrial fibrillation: Secondary | ICD-10-CM

## 2011-05-14 ENCOUNTER — Ambulatory Visit (INDEPENDENT_AMBULATORY_CARE_PROVIDER_SITE_OTHER): Payer: Medicaid Other | Admitting: Cardiology

## 2011-05-14 ENCOUNTER — Encounter: Payer: Self-pay | Admitting: Cardiology

## 2011-05-14 VITALS — BP 108/71 | HR 60 | Ht 64.0 in | Wt 191.0 lb

## 2011-05-14 DIAGNOSIS — Z950 Presence of cardiac pacemaker: Secondary | ICD-10-CM

## 2011-05-14 DIAGNOSIS — I428 Other cardiomyopathies: Secondary | ICD-10-CM

## 2011-05-14 DIAGNOSIS — E875 Hyperkalemia: Secondary | ICD-10-CM

## 2011-05-14 DIAGNOSIS — E119 Type 2 diabetes mellitus without complications: Secondary | ICD-10-CM

## 2011-05-14 DIAGNOSIS — Z7901 Long term (current) use of anticoagulants: Secondary | ICD-10-CM

## 2011-05-14 DIAGNOSIS — I4891 Unspecified atrial fibrillation: Secondary | ICD-10-CM

## 2011-05-14 DIAGNOSIS — N183 Chronic kidney disease, stage 3 unspecified: Secondary | ICD-10-CM

## 2011-05-14 NOTE — Assessment & Plan Note (Signed)
Despite markedly impaired left ventricular systolic function, [name] has done extremely well with cardiomyopathy of uncertain etiology, not having been hospitalized nor suffered a significant exacerbation of heart disease for the past 15 years.  We will continue to follow closely to be sure that there has been no clinical deterioration.  Moderately elevated BNP level may be chronic-no previous levels were apparently ever obtained.  There has been no recent significant weight loss.  Current medications will be continued for now.  Current symptoms with right ear sounds like a middle ear issue.  Patient will be referred for ENT consultation to address this issue as well as recurrent epistaxis on full anticoagulation.

## 2011-05-14 NOTE — Progress Notes (Signed)
Patient ID: [name], female   DOB: Apr 01, 1960, 51 y.o.   MRN: [id number] HPI: Scheduled return visit for this very nice woman with a long-standing cardiomyopathy but a remarkably benign clinical course, now being evaluated for possible congestive heart failure. Current issues date to 03/28/2011 when patient presented to the emergency department with difficulty swallowing and upper esophageal discomfort.  She was subsequently referred to Dr. Oneida [name], who arranged for upper endoscopy, at which +[name] gastritis was diagnosed and treated in the upper esophageal ring dilated.  Colonoscopy resulted in the excision of a small polyp.  Patient does have a sensation of fluid in her ear with negative examinations of the external canal and tympanic membrane.  She also reports dizziness, but has difficulty determining whether this is vertigo or near-syncope.  Symptoms typically occurs when she bends over and resolve rapidly. Prior records were reviewed carefully.  There are no previous BNP determinations.  Chest x-ray shows no pulmonary edema.  Furosemide dosage was doubled for 3 days, which did not produce any appreciable diuresis or change in symptoms.  Prior to Admission medications   Medication Sig Start Date End Date Taking? Authorizing Provider  allopurinol (ZYLOPRIM) 100 MG tablet Take 100 mg by mouth daily.     Yes Historical Provider, MD  benazepril (LOTENSIN) 20 MG tablet Take 20 mg by mouth daily.     Yes Historical Provider, MD  bismuth-metronidazole-tetracycline (PYLERA) [phone] MG per capsule Take 3 capsules by mouth 4 (four) times daily -  before meals and at bedtime. FOR 10 DAYS 05/01/11 05/15/11 Yes Toni Binder, MD  carvedilol (COREG) 25 MG tablet Take 25 mg by mouth 2 (two) times daily with a meal.   Yes Historical Provider, MD  cholecalciferol (VITAMIN D) 1000 UNITS tablet Take 1,000 Units by mouth daily.   Yes Historical Provider, MD  digoxin (LANOXIN) 0.125 MG tablet Take 125 mcg by  mouth as directed. 5 days/ week M-F 04/17/11  Yes Satira Sark, MD  furosemide (LASIX) 40 MG tablet TAKE 1 & 1/2 TABLETS BY MOUTH ONCE DAILY. 08/17/10  Yes Yehuda Savannah, MD  insulin aspart (NOVOLOG) 100 UNIT/ML injection Inject 12-20 Units into the skin 3 (three) times daily. Inject 12 units at 8:00am, 16 units at lunch and 20 units at suppertime   Yes Historical Provider, MD  insulin glargine (LANTUS) 100 UNIT/ML injection Inject 30-40 Units into the skin 2 (two) times daily. Inject 40 units in am and 30 units at bedtime   Yes Historical Provider, MD  insulin NPH (HUMULIN N,NOVOLIN N) 100 UNIT/ML injection Inject 34 Units into the skin at bedtime.    Yes Historical Provider, MD  Magnesium 250 MG TABS Take 1 tablet by mouth daily.   Yes Historical Provider, MD  pantoprazole (PROTONIX) 40 MG tablet Take 40 mg by mouth 2 (two) times daily.   Yes Historical Provider, MD  potassium chloride SA (K-DUR,KLOR-CON) 20 MEQ tablet Take 20 mEq by mouth daily.   Yes Historical Provider, MD  spironolactone (ALDACTONE) 25 MG tablet Take 25 mg by mouth daily.     Yes Historical Provider, MD  warfarin (COUMADIN) 5 MG tablet Take 5 mg by mouth daily. Take as directed   Yes Historical Provider, MD    Allergies  Allergen Reactions  . Wheat Swelling  . Latex Rash  . Penicillins Rash  . Sulfa Antibiotics Rash   Past medical history, social history, and family history reviewed and updated.  ROS: Denies palpitations, orthopnea, PND,  or frank loss of consciousness.  PHYSICAL EXAM: BP 108/71  Pulse 60  Ht 5\' 4"  (1.626 m)  Wt 86.637 kg (191 lb)  BMI 32.79 kg/m2  SpO2 99%; No significant orthostatic change in blood pressure General-Well developed; no acute distress Body habitus-Moderately overweight  Neck-No JVD; no carotid bruits Lungs-clear lung fields; resonant to percussion Cardiovascular-normal PMI; normal S1 and S2; grade 3/6 holosystolic murmur at the left sternal border Abdomen-normal bowel  sounds; soft and non-tender without masses or organomegaly Musculoskeletal-No deformities, no cyanosis or clubbing Neurologic-Normal cranial nerves; symmetric strength and tone Skin-Warm, no significant lesions Extremities-distal pulses intact; no edema  ASSESSMENT AND PLAN:  [name], MD 05/14/2011 4:47 PM

## 2011-05-14 NOTE — Assessment & Plan Note (Addendum)
Renal function is relatively stable; digoxin level will be assessed.  Diuretic regime will be adjusted downward to determine if creatinine can be maintained below a level of 2.0.

## 2011-05-14 NOTE — Assessment & Plan Note (Signed)
Diabetic control has been suboptimal; PCP, Dr. Legrand Rams, will address.

## 2011-05-14 NOTE — Patient Instructions (Addendum)
Your physician recommends that you schedule a follow-up appointment in: 2 weeks  Restrict potassium in diet (Information attatched)  Your physician recommends that you weigh, daily, at the same time every day, and in the same amount of clothing. Please record your daily weights on the handout provided and bring it to your next appointment.  Your physician recommends that you return for lab work in: (10 days 05/24/11) - Please have done prior to your digoxin dose in the morning  BNP level is chronically elevated   Referral to Ear Nose and Throat MD

## 2011-05-14 NOTE — Assessment & Plan Note (Addendum)
INRs have been stable and therapeutic.

## 2011-05-14 NOTE — Assessment & Plan Note (Addendum)
Potassium restriction added to diet.  Will recheck.Bmet in 2 weeks.

## 2011-05-17 ENCOUNTER — Encounter: Payer: Self-pay | Admitting: *Deleted

## 2011-05-20 ENCOUNTER — Telehealth: Payer: Self-pay | Admitting: Cardiology

## 2011-05-20 NOTE — Telephone Encounter (Signed)
Patient would like return phone call regarding lab work.  States she don't know when she is to have it done. / tg

## 2011-05-20 NOTE — Progress Notes (Signed)
Quick Note:  BNP already addressed earlier in chart. ______

## 2011-05-20 NOTE — Telephone Encounter (Signed)
Clarified date of lab work to be 3/29

## 2011-05-20 NOTE — Telephone Encounter (Signed)
Advised spouse that the potassium in her current medications was not significant enough to cause a problem and that she needs to continue to follow Dr Izell [place] recommendations of a low potassium diet and obtain labs as scheduled.  Verbalized understanding.

## 2011-05-20 NOTE — Telephone Encounter (Signed)
PT HAS QUESTIONS ABOUT BEING ON LOW POTASSIUM INTAKE DIET. SHE STATES THAT HER MEDICATIONS HAVE POTASSIUM IN THEM AND THAT THE ANTIBIOTICS DO AS WELL, WOULD LIKE TO SPEAK WITH TOMI.

## 2011-05-22 ENCOUNTER — Ambulatory Visit (INDEPENDENT_AMBULATORY_CARE_PROVIDER_SITE_OTHER): Payer: Medicaid Other | Admitting: *Deleted

## 2011-05-22 DIAGNOSIS — Z7901 Long term (current) use of anticoagulants: Secondary | ICD-10-CM

## 2011-05-22 DIAGNOSIS — I4891 Unspecified atrial fibrillation: Secondary | ICD-10-CM

## 2011-05-24 LAB — BASIC METABOLIC PANEL
BUN: 50 mg/dL — ABNORMAL HIGH (ref 6–23)
CO2: 23 mEq/L (ref 19–32)
Calcium: 9.6 mg/dL (ref 8.4–10.5)
Creat: 2.11 mg/dL — ABNORMAL HIGH (ref 0.50–1.10)
Glucose, Bld: 170 mg/dL — ABNORMAL HIGH (ref 70–99)
Sodium: 138 mEq/L (ref 135–145)

## 2011-05-24 LAB — DIGOXIN LEVEL: Digoxin Level: 0.5 ng/mL — ABNORMAL LOW (ref 0.8–2.0)

## 2011-05-27 ENCOUNTER — Encounter: Payer: Self-pay | Admitting: *Deleted

## 2011-05-28 ENCOUNTER — Encounter: Payer: Self-pay | Admitting: Cardiology

## 2011-05-28 ENCOUNTER — Ambulatory Visit (INDEPENDENT_AMBULATORY_CARE_PROVIDER_SITE_OTHER): Payer: Medicaid Other | Admitting: Cardiology

## 2011-05-28 VITALS — BP 123/82 | HR 60 | Ht 65.0 in | Wt 195.0 lb

## 2011-05-28 DIAGNOSIS — I4891 Unspecified atrial fibrillation: Secondary | ICD-10-CM

## 2011-05-28 DIAGNOSIS — E875 Hyperkalemia: Secondary | ICD-10-CM

## 2011-05-28 DIAGNOSIS — Z7901 Long term (current) use of anticoagulants: Secondary | ICD-10-CM

## 2011-05-28 DIAGNOSIS — I428 Other cardiomyopathies: Secondary | ICD-10-CM

## 2011-05-28 DIAGNOSIS — Z950 Presence of cardiac pacemaker: Secondary | ICD-10-CM

## 2011-05-28 DIAGNOSIS — N183 Chronic kidney disease, stage 3 unspecified: Secondary | ICD-10-CM

## 2011-05-28 DIAGNOSIS — Q246 Congenital heart block: Secondary | ICD-10-CM

## 2011-05-28 MED ORDER — FUROSEMIDE 40 MG PO TABS: ORAL_TABLET | ORAL | Status: DC

## 2011-05-28 NOTE — Patient Instructions (Signed)
Your physician recommends that you schedule a follow-up appointment in: 3 weeks  Your physician recommends that you return for lab work in: 2 weeks  Your physician has recommended you make the following change in your medication:  1 - INCREASE lasix to 60 mg daily 2 - STOP Magnesium 3 - STOP Potassium  Your physician has recommended that you wear an event monitor. Event monitors are medical devices that record the heart's electrical activity. Doctors most often [place] these monitors to diagnose arrhythmias. Arrhythmias are problems with the speed or rhythm of the heartbeat. The monitor is a small, portable device. You can wear one while you do your normal daily activities. This is usually used to diagnose what is causing palpitations/syncope (passing out).

## 2011-05-29 ENCOUNTER — Encounter: Payer: Self-pay | Admitting: Cardiology

## 2011-05-29 NOTE — Assessment & Plan Note (Signed)
Potassium supplementation discontinued and a low potassium diet instituted.  Magnesium supplementation will also be discontinued in the face of chronic kidney disease.

## 2011-05-29 NOTE — Progress Notes (Signed)
Patient ID: [name], female   DOB: 1960/06/30, 51 y.o.   MRN: [id number]  HPI: Patient seen as scheduled in the company of her sister.  She continues to express substantial distress about her sense of well-being.  She notes exertional dyspnea and fatigue, swelling in the hands and feet and a sensation in her right ear which is difficult to characterize.  Sometimes, she describes her symptoms as a "fullness", but other times more as a buzzing that might well represent tinnitus.  She has been evaluated by an otolaryngologist who found her hearing to be normal and could not provide an explanation for her symptoms.  A long discussion reviewed recent events.  The patient dates the onset of her symptoms to immediately following colonoscopy.  She was told that fluid overload caused congestive heart failure, but I do not see compelling evidence for that.  Patient and her sister both appear to feel that something is being kept from them and find it difficult to understand why they were told she had congestive heart failure.  Prior to Admission medications   Medication Sig Start Date End Date Taking? Authorizing Provider  allopurinol (ZYLOPRIM) 100 MG tablet Take 100 mg by mouth daily.     Yes Historical Provider, MD  benazepril (LOTENSIN) 20 MG tablet Take 20 mg by mouth daily.     Yes Historical Provider, MD  carvedilol (COREG) 25 MG tablet Take 25 mg by mouth 2 (two) times daily with a meal.   Yes Historical Provider, MD  cholecalciferol (VITAMIN D) 1000 UNITS tablet Take 1,000 Units by mouth daily.   Yes Historical Provider, MD  digoxin (LANOXIN) 0.125 MG tablet Take 125 mcg by mouth as directed. 5 days/ week M-F 04/17/11  Yes Satira Sark, MD  furosemide (LASIX) 40 MG tablet Take 1 1/2 tablets daily = 60 mg 05/28/11  Yes Yehuda Savannah, MD  insulin aspart (NOVOLOG) 100 UNIT/ML injection Inject 12-20 Units into the skin 3 (three) times daily. Inject 12 units at 8:00am, 16 units at lunch and 20 units  at suppertime   Yes Historical Provider, MD  insulin glargine (LANTUS) 100 UNIT/ML injection Inject 30-40 Units into the skin 2 (two) times daily. Inject 40 units in am and 30 units at bedtime   Yes Historical Provider, MD  insulin NPH (HUMULIN N,NOVOLIN N) 100 UNIT/ML injection Inject 34 Units into the skin at bedtime.    Yes Historical Provider, MD  pantoprazole (PROTONIX) 40 MG tablet Take 40 mg by mouth 2 (two) times daily.   Yes Historical Provider, MD  spironolactone (ALDACTONE) 25 MG tablet Take 25 mg by mouth daily.     Yes Historical Provider, MD  warfarin (COUMADIN) 5 MG tablet Take 5 mg by mouth daily. Take as directed   Yes Historical Provider, MD  bismuth-metronidazole-tetracycline (PYLERA) [phone] MG per capsule Take 3 capsules by mouth 4 (four) times daily -  before meals and at bedtime. FOR 10 DAYS 05/01/11 05/15/11  Danie Binder, MD   Allergies  Allergen Reactions  . Wheat Swelling  . Latex Rash  . Penicillins Rash  . Sulfa Antibiotics Rash   Past medical history, social history, and family history reviewed and updated.  ROS: Denies orthopnea, PND, palpitations or syncope.  She does experience episodic lightheadedness not necessarily related to arising from a sitting or lying position.  PHYSICAL EXAM: BP 123/82  Pulse 60  Ht 5\' 5"  (1.651 m)  Wt 88.451 kg (195 lb)  BMI 32.45 kg/m2  SpO2 98%  General-Well developed; no acute distress Body habitus-moderately overweight Neck-No JVD; no carotid bruits Lungs-clear lung fields; resonant to percussion Cardiovascular-normal PMI; normal S1 and S2; grade 99991111 systolic decrescendo murmur at the lower left sternal border and apex Abdomen-normal bowel sounds; soft and non-tender without masses or organomegaly Musculoskeletal-No deformities, no cyanosis or clubbing Neurologic-Normal cranial nerves; symmetric strength and tone Skin-Warm, no significant lesions Extremities-distal pulses intact; no edema  Echocardiogram:   05/03/2011  Study reviewed-EF of 15-20%, probably moderate tricuspid regurgitation, insignificant mitral regurgitation, moderate right atrial enlargement, no pulmonary hypertension.  ASSESSMENT AND PLAN:      Duration of appointment was one hour, more than half of which was spent in explanation and counseling regarding the patient's condition. [name], MD 05/29/2011 9:01 AM

## 2011-05-29 NOTE — Assessment & Plan Note (Signed)
Stable and therapeutic anticoagulation.

## 2011-05-29 NOTE — Assessment & Plan Note (Signed)
Creatinine has increased modestly since 2008.  Close monitoring will be continued, especially with an increase in patient's dose of diuretic.

## 2011-05-29 NOTE — Assessment & Plan Note (Addendum)
Despite increased weight, elevated BNP and symptoms, I'm not convinced that congestive heart failure is the immediate major issue.  Nonetheless, diuretic therapy will be advanced in attempt to decrease intravascular volume without further impairing renal function.  Otherwise, medications for cardiomyopathy are optimized.  The patient may require referral to our congestive heart failure program and right heart catheterization.

## 2011-05-29 NOTE — Assessment & Plan Note (Addendum)
No pacemaker dysfunction or other issues when last assessed by Dr. Lovena Le.  The absence of biventricular pacing in this setting of a severe cardiomyopathy may be an opportunity for intervention.

## 2011-05-29 NOTE — Assessment & Plan Note (Signed)
With permanent atrial fibrillation and complete heart block plus pacing, it is unlikely that this long-standing atrial arrhythmias contributing to the patient's symptoms.  Anticoagulation is required indefinitely.

## 2011-05-30 ENCOUNTER — Telehealth: Payer: Self-pay | Admitting: *Deleted

## 2011-05-30 NOTE — Telephone Encounter (Signed)
Attempted to contact sister and patient to schedule a time for E Cardio monitor to be placed.  Message left for a return call.

## 2011-05-30 NOTE — Telephone Encounter (Signed)
APPOINTMENT MADE FOR PATIENT TO COME IN FOR E CARDIO PLACEMENT ON MONDAY

## 2011-06-03 ENCOUNTER — Encounter (INDEPENDENT_AMBULATORY_CARE_PROVIDER_SITE_OTHER): Payer: Medicaid Other | Admitting: *Deleted

## 2011-06-03 ENCOUNTER — Telehealth: Payer: Self-pay | Admitting: *Deleted

## 2011-06-03 ENCOUNTER — Ambulatory Visit (INDEPENDENT_AMBULATORY_CARE_PROVIDER_SITE_OTHER): Payer: Medicaid Other | Admitting: *Deleted

## 2011-06-03 DIAGNOSIS — R42 Dizziness and giddiness: Secondary | ICD-10-CM

## 2011-06-03 DIAGNOSIS — I4891 Unspecified atrial fibrillation: Secondary | ICD-10-CM

## 2011-06-03 NOTE — Telephone Encounter (Signed)
Increase furosemide to 80 mg per day.

## 2011-06-03 NOTE — Telephone Encounter (Signed)
Patient presents to have e cardio monitor placed and brought to my attention that she continues to have ankle edema.  Orders were taken at last visit to increase her lasix to 60 mg daily, which after researching, I found to be her usual regimen.  Weight is 193 today, which is down 2 pounds from last visit.

## 2011-06-04 ENCOUNTER — Other Ambulatory Visit: Payer: Self-pay | Admitting: *Deleted

## 2011-06-04 ENCOUNTER — Encounter: Payer: Self-pay | Admitting: Internal Medicine

## 2011-06-04 DIAGNOSIS — I442 Atrioventricular block, complete: Secondary | ICD-10-CM

## 2011-06-04 MED ORDER — FUROSEMIDE 80 MG PO TABS: 80.0000 mg | ORAL_TABLET | Freq: Two times a day (BID) | ORAL | Status: DC

## 2011-06-04 NOTE — Telephone Encounter (Signed)
Explained to patient that she is to take 80 mg of lasix once a day.  Verbalized understanding.

## 2011-06-05 ENCOUNTER — Telehealth: Payer: Self-pay | Admitting: Cardiology

## 2011-06-05 ENCOUNTER — Other Ambulatory Visit: Payer: Self-pay | Admitting: *Deleted

## 2011-06-05 DIAGNOSIS — I4891 Unspecified atrial fibrillation: Secondary | ICD-10-CM

## 2011-06-05 NOTE — Telephone Encounter (Signed)
Attepted to return call.  Message left to call me back

## 2011-06-05 NOTE — Telephone Encounter (Signed)
PT CALLING BECAUSE MONITOR IS BREAKING HER OUT. SHE WAS ADVISED THAT SHE CAN MOVE THE PAD TO DIFFERENT SPOTS EVERYDAY TO HELP WITH IRRITATION.  PT STATES THAT SHE IS NOT GOING TO PUT MONITOR BACK ON UNTIL SHE HEARS BACK FROM [place].  PT STATED SEVERAL TIME SHE  IS ALLERGIC TO LATEX.  PT WAS ALSO INFORMED THAT WE DO NOT HAVE ANY OTHER TYPE OF ADHESIVE TO USE.

## 2011-06-05 NOTE — Telephone Encounter (Signed)
PT DROPPED OF MONITOR AT FRONT DESK.

## 2011-06-06 NOTE — Telephone Encounter (Signed)
I will discuss this with her at her next office visit.

## 2011-06-10 ENCOUNTER — Telehealth: Payer: Self-pay | Admitting: *Deleted

## 2011-06-10 ENCOUNTER — Ambulatory Visit (INDEPENDENT_AMBULATORY_CARE_PROVIDER_SITE_OTHER): Payer: Medicaid Other | Admitting: *Deleted

## 2011-06-10 DIAGNOSIS — I4891 Unspecified atrial fibrillation: Secondary | ICD-10-CM

## 2011-06-12 ENCOUNTER — Telehealth: Payer: Self-pay | Admitting: Cardiology

## 2011-06-12 NOTE — Telephone Encounter (Signed)
Attempted to contact patient regarding lab work.  Pt was made aware upon discharge at last visit that lab work was to be due in 2 weeks from the second and a letter was sent as a reminder.

## 2011-06-12 NOTE — Telephone Encounter (Signed)
Patient received letter stating she needed labwork.  Wants to know what labwork it is she is due for. / tg

## 2011-06-13 ENCOUNTER — Ambulatory Visit: Payer: Medicaid Other | Admitting: Cardiology

## 2011-06-14 LAB — COMPREHENSIVE METABOLIC PANEL
AST: 20 U/L (ref 0–37)
Albumin: 3.9 g/dL (ref 3.5–5.2)
Alkaline Phosphatase: 137 U/L — ABNORMAL HIGH (ref 39–117)
Potassium: 4.5 mEq/L (ref 3.5–5.3)
Sodium: 137 mEq/L (ref 135–145)
Total Protein: 6.8 g/dL (ref 6.0–8.3)

## 2011-06-16 ENCOUNTER — Encounter: Payer: Self-pay | Admitting: Cardiology

## 2011-06-16 DIAGNOSIS — Z0189 Encounter for other specified special examinations: Secondary | ICD-10-CM | POA: Insufficient documentation

## 2011-06-18 ENCOUNTER — Ambulatory Visit (INDEPENDENT_AMBULATORY_CARE_PROVIDER_SITE_OTHER): Payer: Medicaid Other | Admitting: Cardiology

## 2011-06-18 ENCOUNTER — Encounter: Payer: Self-pay | Admitting: Cardiology

## 2011-06-18 VITALS — BP 124/80 | HR 67 | Resp 18 | Ht 66.0 in | Wt 186.0 lb

## 2011-06-18 DIAGNOSIS — I10 Essential (primary) hypertension: Secondary | ICD-10-CM

## 2011-06-18 DIAGNOSIS — Z950 Presence of cardiac pacemaker: Secondary | ICD-10-CM

## 2011-06-18 DIAGNOSIS — Z7901 Long term (current) use of anticoagulants: Secondary | ICD-10-CM

## 2011-06-18 DIAGNOSIS — I428 Other cardiomyopathies: Secondary | ICD-10-CM

## 2011-06-18 DIAGNOSIS — E119 Type 2 diabetes mellitus without complications: Secondary | ICD-10-CM

## 2011-06-18 NOTE — Assessment & Plan Note (Addendum)
There is been modest deterioration of renal function with increased dose of diuretics.  A repeat metabolic profile will be obtained in one week.

## 2011-06-18 NOTE — Progress Notes (Signed)
Name: [name]    DOB: 1960/04/12  Age: 51 y.o.  MR#: [id number]       PCP:  [name], MD, MD      Insurance: @PAYORNAME @   CC:    Chief Complaint  Patient presents with  . Appointment    no complaints -meds/list    VS BP 124/80  Pulse 67  Resp 18  Ht 5\' 6"  (1.676 m)  Wt 186 lb (84.369 kg)  BMI 30.02 kg/m2  Weights Current Weight  06/18/11 186 lb (84.369 kg)  05/28/11 195 lb (88.451 kg)  05/14/11 191 lb (86.637 kg)    Blood Pressure  BP Readings from Last 3 Encounters:  06/18/11 124/80  05/28/11 123/82  05/14/11 108/71     Admit date:  (Not on file) Last encounter with RMR:  06/16/2011   Allergy Allergies  Allergen Reactions  . Wheat Swelling  . Latex Rash  . Penicillins Rash  . Sulfa Antibiotics Rash    Current Outpatient Prescriptions  Medication Sig Dispense Refill  . allopurinol (ZYLOPRIM) 100 MG tablet Take 100 mg by mouth daily.        . benazepril (LOTENSIN) 20 MG tablet Take 20 mg by mouth daily.        . carvedilol (COREG) 25 MG tablet Take 25 mg by mouth 2 (two) times daily with a meal.      . cholecalciferol (VITAMIN D) 1000 UNITS tablet Take 1,000 Units by mouth daily.      . digoxin (LANOXIN) 0.125 MG tablet Take 125 mcg by mouth as directed. 5 days/ week M-F      . furosemide (LASIX) 80 MG tablet Take 1 tablet (80 mg total) by mouth 2 (two) times daily.  30 tablet  12  . insulin aspart (NOVOLOG) 100 UNIT/ML injection Inject 12-20 Units into the skin 3 (three) times daily. Inject 12 units at 8:00am, 16 units at lunch and 20 units at suppertime      . insulin glargine (LANTUS) 100 UNIT/ML injection Inject 30-40 Units into the skin 2 (two) times daily. Inject 40 units in am and 30 units at bedtime      . insulin NPH (HUMULIN N,NOVOLIN N) 100 UNIT/ML injection Inject 34 Units into the skin at bedtime.       . pantoprazole (PROTONIX) 40 MG tablet Take 40 mg by mouth 2 (two) times daily.      Marland Kitchen spironolactone (ALDACTONE) 25 MG tablet Take 25 mg  by mouth daily.        Marland Kitchen warfarin (COUMADIN) 5 MG tablet Take 5 mg by mouth daily. Take as directed      . bismuth-metronidazole-tetracycline (PYLERA) 140-125-125 MG per capsule Take 3 capsules by mouth 4 (four) times daily -  before meals and at bedtime. FOR 10 DAYS  120 capsule  0    Discontinued Meds:   There are no discontinued medications.  Patient Active Problem List  Diagnoses  . AODM  . CARDIOMYOPATHY  . ATRIAL FIBRILLATION, CHRONIC  . CHRONIC KIDNEY DISEASE STAGE III (MODERATE)  . Cardiac pacemaker in situ  . Chronic anticoagulation  . Congenital third degree heart block  . Reversible ischemic neurological deficit  . Hyperkalemia  . Laboratory test    LABS Anti-coag visit on 06/10/2011  Component Date Value  . INR 06/10/2011 1.5   Orders Only on 06/05/2011  Component Date Value  . Sodium 06/05/2011 137   . Potassium 06/05/2011 4.5   . Chloride 06/05/2011 101   .  CO2 06/05/2011 25   . Glucose, Bld 06/05/2011 300*  . BUN 06/05/2011 43*  . Creat 06/05/2011 2.06*  . Total Bilirubin 06/05/2011 1.3*  . Alkaline Phosphatase 06/05/2011 137*  . AST 06/05/2011 20   . ALT 06/05/2011 14   . Total Protein 06/05/2011 6.8   . Albumin 06/05/2011 3.9   . Calcium 06/05/2011 8.3*  . TSH 06/05/2011 1.865   . Brain Natriuretic Peptide 06/05/2011 573.2*  . D-Dimer, Quant 06/05/2011 0.49*  Anti-coag visit on 06/03/2011  Component Date Value  . INR 06/03/2011 5.8   Anti-coag visit on 05/22/2011  Component Date Value  . INR 05/22/2011 2.8   Office Visit on 05/14/2011  Component Date Value  . Sodium 05/23/2011 138   . Potassium 05/23/2011 5.3   . Chloride 05/23/2011 106   . CO2 05/23/2011 23   . Glucose, Bld 05/23/2011 170*  . BUN 05/23/2011 50*  . Creat 05/23/2011 2.11*  . Calcium 05/23/2011 9.6   . Digoxin Level 05/23/2011 0.5*  Anti-coag visit on 05/13/2011  Component Date Value  . INR 05/13/2011 2.2   Anti-coag visit on 05/08/2011  Component Date Value  . INR  05/08/2011 2.6   Telephone on 04/29/2011  Component Date Value  . Brain Natriuretic Peptide 04/29/2011 4835.0*  . Sodium 04/29/2011 136   . Potassium 04/29/2011 4.5   . Chloride 04/29/2011 99   . CO2 04/29/2011 27   . Glucose, Bld 04/29/2011 248*  . BUN 04/29/2011 41*  . Creat 04/29/2011 2.28*  . Calcium 04/29/2011 9.3   Admission on 04/23/2011, Discharged on 04/23/2011  Component Date Value  . Glucose-Capillary 04/23/2011 269*  Orders Only on 04/15/2011  Component Date Value  . Pro B Natriuretic peptid* 04/15/2011 4309.00*  There may be more visits with results that are not included.   Results for this Opt Visit:     Results for orders placed in visit on 06/10/11  POCT INR      Component Value Range   INR 1.5      EKG Orders placed in visit on 06/05/11  . CARDIAC EVENT MONITOR     Prior Assessment and Plan Problem List as of 06/18/2011          Cardiology Problems   CARDIOMYOPATHY   Last Assessment & Plan Note   05/28/2011 Office Visit Addendum 05/30/2011 11:20 AM by Yehuda Savannah, MD    Despite increased weight, elevated BNP and symptoms, I'm not convinced that congestive heart failure is the immediate major issue.  Nonetheless, diuretic therapy will be advanced in attempt to decrease intravascular volume without further impairing renal function.  Otherwise, medications for cardiomyopathy are optimized.  The patient may require referral to our congestive heart failure program and right heart catheterization.    ATRIAL FIBRILLATION, CHRONIC   Last Assessment & Plan Note   05/28/2011 Office Visit Signed 05/29/2011  9:08 AM by Yehuda Savannah, MD    With permanent atrial fibrillation and complete heart block plus pacing, it is unlikely that this long-standing atrial arrhythmias contributing to the patient's symptoms.  Anticoagulation is required indefinitely.      Other   AODM   Last Assessment & Plan Note   05/14/2011 Office Visit Signed 05/14/2011  4:59 PM by Yehuda Savannah, MD    Diabetic control has been suboptimal; PCP, Dr. Legrand Rams, will address.    CHRONIC KIDNEY DISEASE STAGE III (MODERATE)   Last Assessment & Plan Note   05/28/2011 Office Visit Signed 05/29/2011  9:13  AM by Yehuda Savannah, MD    Creatinine has increased modestly since 2008.  Close monitoring will be continued, especially with an increase in patient's dose of diuretic.    Cardiac pacemaker in situ   Last Assessment & Plan Note   05/28/2011 Office Visit Addendum 05/29/2011  9:09 AM by Yehuda Savannah, MD    No pacemaker dysfunction or other issues when last assessed by Dr. Lovena Le.  The absence of biventricular pacing in this setting of a severe cardiomyopathy may be an opportunity for intervention.    Chronic anticoagulation   Last Assessment & Plan Note   05/28/2011 Office Visit Signed 05/29/2011  9:13 AM by Yehuda Savannah, MD    Stable and therapeutic anticoagulation.    Congenital third degree heart block   Last Assessment & Plan Note   04/30/2011 Office Visit Signed 04/30/2011  3:51 PM by Lendon Colonel, NP    She sees Dr. Lovena Le in May for face to face pacemaker interrogation.    Reversible ischemic neurological deficit   Hyperkalemia   Last Assessment & Plan Note   05/28/2011 Office Visit Signed 05/29/2011  9:14 AM by Yehuda Savannah, MD    Potassium supplementation discontinued and a low potassium diet instituted.  Magnesium supplementation will also be discontinued in the face of chronic kidney disease.    Laboratory test       Imaging: No results found.   FRS Calculation: Score not calculated. Missing: Total Cholesterol

## 2011-06-18 NOTE — Patient Instructions (Addendum)
Your physician recommends that you schedule a follow-up appointment in: [address] physician recommends that you return for lab work in: NEXT Monday OR Tuesday

## 2011-06-18 NOTE — Assessment & Plan Note (Addendum)
Recent control has been suboptimal based upon results from metabolic profiles.  Referred back to Dr. Legrand Rams for additional evaluation and treatment and to Dr. Dorris Fetch as necessary.

## 2011-06-18 NOTE — Progress Notes (Signed)
Patient ID: BECKY MISSOURI, female   DOB: [birth year], 51 y.o.   MRN: [id number]  HPI: Scheduled return visit for continued assessment and treatment of a nonischemic cardiomyopathy in the setting of possibly congenital complete heart block.  Since her previous assessment, Ms. Ribble was evaluated by Dr. Debara Pickett at [facility].  According to his verbal report to me, he had no additional suggestions for care and did not recommend additional diagnostic testing.  Fortunately, she feels somewhat better than she did when I last saw her with increased energy and decreased tinnitus.  The otolaryngologist suggests that her auditory symptoms may improve in concert with her volume status.  Prior to Admission medications   Medication Sig Start Date End Date Taking? Authorizing Provider  allopurinol (ZYLOPRIM) 100 MG tablet Take 100 mg by mouth daily.     Yes Historical Provider, MD  benazepril (LOTENSIN) 20 MG tablet Take 20 mg by mouth daily.     Yes Historical Provider, MD  carvedilol (COREG) 25 MG tablet Take 25 mg by mouth 2 (two) times daily with a meal.   Yes Historical Provider, MD  cholecalciferol (VITAMIN D) 1000 UNITS tablet Take 1,000 Units by mouth daily.   Yes Historical Provider, MD  digoxin (LANOXIN) 0.125 MG tablet Take 125 mcg by mouth as directed. 5 days/ week M-F 04/17/11  Yes Satira Sark, MD  furosemide (LASIX) 80 MG tablet Take 1 tablet (80 mg total) by mouth 2 (two) times daily. 06/04/11 06/03/12 Yes Yehuda Savannah, MD  insulin aspart (NOVOLOG) 100 UNIT/ML injection Inject 12-20 Units into the skin 3 (three) times daily. Inject 12 units at 8:00am, 16 units at lunch and 20 units at suppertime   Yes Historical Provider, MD  insulin glargine (LANTUS) 100 UNIT/ML injection Inject 30-40 Units into the skin 2 (two) times daily. Inject 40 units in am and 30 units at bedtime   Yes Historical Provider, MD  insulin NPH (HUMULIN N,NOVOLIN N) 100 UNIT/ML injection Inject 34 Units  into the skin at bedtime.    Yes Historical Provider, MD  pantoprazole (PROTONIX) 40 MG tablet Take 40 mg by mouth 2 (two) times daily.   Yes Historical Provider, MD  spironolactone (ALDACTONE) 25 MG tablet Take 25 mg by mouth daily.     Yes Historical Provider, MD  warfarin (COUMADIN) 5 MG tablet Take 5 mg by mouth daily. Take as directed   Yes Historical Provider, MD  bismuth-metronidazole-tetracycline (PYLERA) [phone] MG per capsule Take 3 capsules by mouth 4 (four) times daily -  before meals and at bedtime. FOR 10 DAYS 05/01/11 05/15/11  Danie Binder, MD   Allergies  Allergen Reactions  . Wheat Swelling  . Latex Rash  . Penicillins Rash  . Sulfa Antibiotics Rash     Past medical history, social history, and family history reviewed and updated.  ROS: Denies orthopnea, PND, pedal edema.  No cough, fever nor sputum production.  PHYSICAL EXAM: BP 124/80  Pulse 67  Resp 18  Ht 5\' 6"  (1.676 m)  Wt 84.369 kg (186 lb)  BMI 30.02 kg/m2  General-Well developed; no acute distress Body habitus-Overweight Neck-Mild JVD; no carotid bruits Lungs-clear lung fields; resonant to percussion Cardiovascular-normal PMI; normal S1 and S2; grade [id number] holosystolic decrescendo murmur at the left sternal border Abdomen-normal bowel sounds; soft and non-tender without masses or organomegaly Musculoskeletal-No deformities, no cyanosis or clubbing Neurologic-Normal cranial nerves; symmetric strength and tone Skin-Warm, no significant lesions Extremities-distal pulses intact; no edema  ASSESSMENT  AND PLAN:  [name], MD 06/18/2011 4:16 PM

## 2011-06-18 NOTE — Assessment & Plan Note (Signed)
Improved with 10 pound diuresis.  Current dose of furosemide will be continued with monitoring of electrolytes and renal function.

## 2011-06-18 NOTE — Assessment & Plan Note (Signed)
INRs have been variable of late.  Hopefully, this does not reflect an inability on the part of the patient to accurately take her medications.

## 2011-06-18 NOTE — Assessment & Plan Note (Signed)
Patient is scheduled for reevaluation by Dr. Lovena Le in the near future at which time he can consider whether implantation of an AICD with biventricular pacing would be appropriate.

## 2011-06-26 ENCOUNTER — Encounter: Payer: Self-pay | Admitting: Gastroenterology

## 2011-07-01 ENCOUNTER — Ambulatory Visit (INDEPENDENT_AMBULATORY_CARE_PROVIDER_SITE_OTHER): Payer: Medicaid Other | Admitting: *Deleted

## 2011-07-01 DIAGNOSIS — I4891 Unspecified atrial fibrillation: Secondary | ICD-10-CM

## 2011-07-02 ENCOUNTER — Encounter: Payer: Self-pay | Admitting: *Deleted

## 2011-07-02 ENCOUNTER — Telehealth: Payer: Self-pay | Admitting: *Deleted

## 2011-07-04 ENCOUNTER — Telehealth: Payer: Self-pay | Admitting: *Deleted

## 2011-07-04 ENCOUNTER — Other Ambulatory Visit: Payer: Self-pay | Admitting: Cardiology

## 2011-07-04 ENCOUNTER — Other Ambulatory Visit: Payer: Self-pay | Admitting: *Deleted

## 2011-07-04 DIAGNOSIS — R5383 Other fatigue: Secondary | ICD-10-CM

## 2011-07-04 NOTE — Telephone Encounter (Signed)
Requested lab work has not been done despite letter sent.  Message left as a second reminder.

## 2011-07-05 ENCOUNTER — Telehealth: Payer: Self-pay | Admitting: Cardiology

## 2011-07-05 LAB — BASIC METABOLIC PANEL
CO2: 21 mEq/L (ref 19–32)
Calcium: 8.9 mg/dL (ref 8.4–10.5)
Chloride: 100 mEq/L (ref 96–112)
Glucose, Bld: 251 mg/dL — ABNORMAL HIGH (ref 70–99)
Potassium: 4.4 mEq/L (ref 3.5–5.3)
Sodium: 135 mEq/L (ref 135–145)

## 2011-07-05 NOTE — Telephone Encounter (Signed)
Patient would like BNP results.Marland Kitchen / tg

## 2011-07-08 ENCOUNTER — Encounter: Payer: Medicaid Other | Admitting: Internal Medicine

## 2011-07-15 ENCOUNTER — Ambulatory Visit (INDEPENDENT_AMBULATORY_CARE_PROVIDER_SITE_OTHER): Payer: Medicaid Other | Admitting: *Deleted

## 2011-07-15 DIAGNOSIS — I4891 Unspecified atrial fibrillation: Secondary | ICD-10-CM

## 2011-07-18 ENCOUNTER — Encounter: Payer: Self-pay | Admitting: *Deleted

## 2011-07-18 ENCOUNTER — Encounter: Payer: Self-pay | Admitting: Cardiology

## 2011-07-18 ENCOUNTER — Ambulatory Visit (INDEPENDENT_AMBULATORY_CARE_PROVIDER_SITE_OTHER): Payer: Medicaid Other | Admitting: Cardiology

## 2011-07-18 VITALS — BP 105/69 | HR 61 | Resp 16 | Ht 66.0 in | Wt 186.0 lb

## 2011-07-18 DIAGNOSIS — I428 Other cardiomyopathies: Secondary | ICD-10-CM

## 2011-07-18 DIAGNOSIS — Z7901 Long term (current) use of anticoagulants: Secondary | ICD-10-CM

## 2011-07-18 DIAGNOSIS — I10 Essential (primary) hypertension: Secondary | ICD-10-CM

## 2011-07-18 NOTE — Progress Notes (Deleted)
Name: [name]    DOB: [birth year]  Age: 51 y.o.  MR#: [id number]       PCP:  [name], MD, MD      Insurance: @PAYORNAME @   CC:    Chief Complaint  Patient presents with  . Appointment    NO COMPLAINTS +MEDS    VS BP 105/69  Pulse 61  Resp 16  Ht 5\' 6"  (1.676 m)  Wt 186 lb (84.369 kg)  BMI 30.02 kg/m2  Weights Current Weight  07/18/11 186 lb (84.369 kg)  06/18/11 186 lb (84.369 kg)  05/28/11 195 lb (88.451 kg)    Blood Pressure  BP Readings from Last 3 Encounters:  07/18/11 105/69  06/18/11 124/80  05/28/11 123/82     Admit date:  (Not on file) Last encounter with RMR:  07/05/2011   Allergy Allergies  Allergen Reactions  . Wheat Swelling  . Latex Rash  . Penicillins Rash  . Sulfa Antibiotics Rash    Current Outpatient Prescriptions  Medication Sig Dispense Refill  . allopurinol (ZYLOPRIM) 100 MG tablet Take 150 mg by mouth daily.       . benazepril (LOTENSIN) 20 MG tablet Take 20 mg by mouth daily.        . carvedilol (COREG) 25 MG tablet Take 25 mg by mouth 2 (two) times daily with a meal.      . cholecalciferol (VITAMIN D) 1000 UNITS tablet Take 1,000 Units by mouth daily.      . digoxin (LANOXIN) 0.125 MG tablet Take 125 mcg by mouth as directed. 5 days/ week M-F      . furosemide (LASIX) 80 MG tablet Take 1 tablet (80 mg total) by mouth 2 (two) times daily.  30 tablet  12  . insulin aspart (NOVOLOG) 100 UNIT/ML injection Inject 12-20 Units into the skin 3 (three) times daily. Inject 12 units at 8:00am, 16 units at lunch and 20 units at suppertime      . insulin glargine (LANTUS) 100 UNIT/ML injection Inject 30-40 Units into the skin 2 (two) times daily. Inject 40 units in am and 30 units at bedtime      . insulin NPH (HUMULIN N,NOVOLIN N) 100 UNIT/ML injection Inject 34 Units into the skin at bedtime.       . pantoprazole (PROTONIX) 40 MG tablet Take 40 mg by mouth daily.       Marland Kitchen spironolactone (ALDACTONE) 25 MG tablet Take 25 mg by mouth daily.         Marland Kitchen warfarin (COUMADIN) 5 MG tablet Take 5 mg by mouth daily. Take as directed        . bismuth-metronidazole-tetracycline (PYLERA) 140-125-125 MG per capsule Take 3 capsules by mouth 4 (four) times daily -  before meals and at bedtime. FOR 10 DAYS  120 capsule  0    Discontinued Meds:   There are no discontinued medications.  Patient Active Problem List  Diagnoses  . AODM  . CARDIOMYOPATHY  . ATRIAL FIBRILLATION, CHRONIC  . CHRONIC KIDNEY DISEASE STAGE III (MODERATE)  . Cardiac pacemaker in situ  . Chronic anticoagulation  . Congenital third degree heart block  . Reversible ischemic neurological deficit  . Hyperkalemia  . Laboratory test    LABS Anti-coag visit on 07/15/2011  Component Date Value  . INR 07/15/2011 4.0   Orders Only on 07/04/2011  Component Date Value  . Sodium 07/04/2011 135   . Potassium 07/04/2011 4.4   . Chloride 07/04/2011 100   . CO2  07/04/2011 21   . Glucose, Bld 07/04/2011 251*  . BUN 07/04/2011 65*  . Creat 07/04/2011 1.96*  . Calcium 07/04/2011 8.9   . Brain Natriuretic Peptide 07/04/2011 357.9*  Anti-coag visit on 07/01/2011  Component Date Value  . INR 07/01/2011 3.7   Anti-coag visit on 06/10/2011  Component Date Value  . INR 06/10/2011 1.5   Orders Only on 06/05/2011  Component Date Value  . Sodium 06/05/2011 137   . Potassium 06/05/2011 4.5   . Chloride 06/05/2011 101   . CO2 06/05/2011 25   . Glucose, Bld 06/05/2011 300*  . BUN 06/05/2011 43*  . Creat 06/05/2011 2.06*  . Total Bilirubin 06/05/2011 1.3*  . Alkaline Phosphatase 06/05/2011 137*  . AST 06/05/2011 20   . ALT 06/05/2011 14   . Total Protein 06/05/2011 6.8   . Albumin 06/05/2011 3.9   . Calcium 06/05/2011 8.3*  . TSH 06/05/2011 1.865   . Brain Natriuretic Peptide 06/05/2011 573.2*  . D-Dimer, Quant 06/05/2011 0.49*  Anti-coag visit on 06/03/2011  Component Date Value  . INR 06/03/2011 5.8   Anti-coag visit on 05/22/2011  Component Date Value  . INR  05/22/2011 2.8   Office Visit on 05/14/2011  Component Date Value  . Sodium 05/23/2011 138   . Potassium 05/23/2011 5.3   . Chloride 05/23/2011 106   . CO2 05/23/2011 23   . Glucose, Bld 05/23/2011 170*  . BUN 05/23/2011 50*  . Creat 05/23/2011 2.11*  . Calcium 05/23/2011 9.6   . Digoxin Level 05/23/2011 0.5*  Anti-coag visit on 05/13/2011  Component Date Value  . INR 05/13/2011 2.2   Anti-coag visit on 05/08/2011  Component Date Value  . INR 05/08/2011 2.6   There may be more visits with results that are not included.   Results for this Opt Visit:     Results for orders placed in visit on 07/15/11  POCT INR      Component Value Range   INR 4.0      EKG Orders placed in visit on 06/05/11  . CARDIAC EVENT MONITOR     Prior Assessment and Plan Problem List as of 07/18/2011          Cardiology Problems   CARDIOMYOPATHY   Last Assessment & Plan Note   06/18/2011 Office Visit Signed 06/18/2011  4:26 PM by Yehuda Savannah, MD    Improved with 10 pound diuresis.  Current dose of furosemide will be continued with monitoring of electrolytes and renal function.    ATRIAL FIBRILLATION, CHRONIC   Last Assessment & Plan Note   05/28/2011 Office Visit Signed 05/29/2011  9:08 AM by Yehuda Savannah, MD    With permanent atrial fibrillation and complete heart block plus pacing, it is unlikely that this long-standing atrial arrhythmias contributing to the patient's symptoms.  Anticoagulation is required indefinitely.      Other   AODM   Last Assessment & Plan Note   06/18/2011 Office Visit Addendum 06/21/2011  3:46 PM by Yehuda Savannah, MD    Recent control has been suboptimal based upon results from metabolic profiles.  Referred back to Dr. Legrand Rams for additional evaluation and treatment and to Dr. Dorris Fetch as necessary.    CHRONIC KIDNEY DISEASE STAGE III (MODERATE)   Last Assessment & Plan Note   06/18/2011 Office Visit Addendum 06/21/2011  3:47 PM by Yehuda Savannah, MD    There  is been modest deterioration of renal function with increased dose of diuretics.  A  repeat metabolic profile will be obtained in one week.    Cardiac pacemaker in situ   Last Assessment & Plan Note   06/18/2011 Office Visit Signed 06/18/2011  4:26 PM by Yehuda Savannah, MD    Patient is scheduled for reevaluation by Dr. Lovena Le in the near future at which time he can consider whether implantation of an AICD with biventricular pacing would be appropriate.    Chronic anticoagulation   Last Assessment & Plan Note   06/18/2011 Office Visit Signed 06/18/2011  4:27 PM by Yehuda Savannah, MD    INRs have been variable of late.  Hopefully, this does not reflect an inability on the part of the patient to accurately take her medications.    Congenital third degree heart block   Last Assessment & Plan Note   04/30/2011 Office Visit Signed 04/30/2011  3:51 PM by Lendon Colonel, NP    She sees Dr. Lovena Le in May for face to face pacemaker interrogation.    Reversible ischemic neurological deficit   Hyperkalemia   Last Assessment & Plan Note   05/28/2011 Office Visit Signed 05/29/2011  9:14 AM by Yehuda Savannah, MD    Potassium supplementation discontinued and a low potassium diet instituted.  Magnesium supplementation will also be discontinued in the face of chronic kidney disease.    Laboratory test       Imaging: No results found.   FRS Calculation: Score not calculated. Missing: Total Cholesterol

## 2011-07-18 NOTE — Progress Notes (Signed)
Patient ID: [name], female   DOB: August 24, 1960, 51 y.o.   MRN: [id number]  HPI: Myrene Buddy continues to complain of easy fatigability and decreased exercise tolerance.  Dyspnea is not prominent.  She has been walking on a daily basis and attempting to gradually increase the distance covered, but frequently stops with fatigue and does not nearly have as much energy as she did months ago.  She continues to relate her symptomatic deterioration to her esophageal dilatation procedure although I have discussed that with her on multiple occasions and cannot find a connection between the 2 events.  She is scheduled to see Dr. Lovena Le next week for consideration of biventricular pacing and an AICD.  Prior to Admission medications   Medication Sig Start Date End Date Taking? Authorizing Provider  allopurinol (ZYLOPRIM) 100 MG tablet Take 150 mg by mouth daily.    Yes Historical Provider, MD  benazepril (LOTENSIN) 20 MG tablet Take 20 mg by mouth daily.     Yes Historical Provider, MD  carvedilol (COREG) 25 MG tablet Take 25 mg by mouth 2 (two) times daily with a meal.   Yes Historical Provider, MD  cholecalciferol (VITAMIN D) 1000 UNITS tablet Take 1,000 Units by mouth daily.   Yes Historical Provider, MD  digoxin (LANOXIN) 0.125 MG tablet Take 125 mcg by mouth as directed. 5 days/ week M-F 04/17/11  Yes Satira Sark, MD  furosemide (LASIX) 80 MG tablet Take 1 tablet (80 mg total) by mouth 2 (two) times daily. 06/04/11 06/03/12 Yes Yehuda Savannah, MD  insulin aspart (NOVOLOG) 100 UNIT/ML injection Inject 12-20 Units into the skin 3 (three) times daily. Inject 12 units at 8:00am, 16 units at lunch and 20 units at suppertime   Yes Historical Provider, MD  insulin glargine (LANTUS) 100 UNIT/ML injection Inject 30-40 Units into the skin 2 (two) times daily. Inject 40 units in am and 30 units at bedtime   Yes Historical Provider, MD  insulin NPH (HUMULIN N,NOVOLIN N) 100 UNIT/ML injection Inject 34 Units into the  skin at bedtime.    Yes Historical Provider, MD  pantoprazole (PROTONIX) 40 MG tablet Take 40 mg by mouth daily.    Yes Historical Provider, MD  spironolactone (ALDACTONE) 25 MG tablet Take 25 mg by mouth daily.     Yes Historical Provider, MD  warfarin (COUMADIN) 5 MG tablet Take 5 mg by mouth daily. Take as directed     Yes Historical Provider, MD  bismuth-metronidazole-tetracycline (PYLERA) [phone] MG per capsule Take 3 capsules by mouth 4 (four) times daily -  before meals and at bedtime. FOR 10 DAYS 05/01/11 05/15/11  Danie Binder, MD   Allergies  Allergen Reactions  . Wheat Swelling  . Latex Rash  . Penicillins Rash  . Sulfa Antibiotics Rash     Past medical history, social history, and family history reviewed and updated.  ROS: Denies orthopnea, PND, pedal edema, lightheadedness, palpitations or syncope.  No cough or sputum production.  All other systems reviewed and are negative.  PHYSICAL EXAM: BP 105/69  Pulse 61  Resp 16  Ht 5\' 6"  (1.676 m)  Wt 84.369 kg (186 lb)  BMI 30.02 kg/m2  General-Well developed; no acute distress Body habitus-proportionate weight and height Neck-No JVD; no carotid bruits Lungs-clear lung fields; resonant to percussion Cardiovascular-normal PMI; normal S1 and S2; grade 2/6 nearly holosystolic murmur at the left sternal border Abdomen-normal bowel sounds; soft and non-tender without masses or organomegaly Musculoskeletal-No deformities, no cyanosis or clubbing  Neurologic-Normal cranial nerves; symmetric strength and tone Skin-Warm, no significant lesions Extremities-Normal distal pulses on the right; decreased on the left; Trace edema  ASSESSMENT AND PLAN:  [name], MD 07/18/2011 3:44 PM

## 2011-07-18 NOTE — Assessment & Plan Note (Signed)
Stable and therapeutic anticoagulation for many years without adverse consequences.

## 2011-07-18 NOTE — Patient Instructions (Signed)
Your physician recommends that you schedule a follow-up appointment in: 3 months  Your physician recommends that you return for lab work in: 1 month

## 2011-07-18 NOTE — Assessment & Plan Note (Addendum)
Patient has diuresed 10 pounds with a concomitant decrease in symptoms and continues to improve despite no additional weight loss.  Her BNP level has decreased from a peak of 4835 to 357 earlier this month.  Although she had no radiographic evidence nor prominent physical findings for congestive heart failure, did not present with substantial weight gain, and did not improve with initial diuresis, it appears that congestive heart failure has been the problem in recent months.  I have endorsed her strategy of gradually increasing her level of exercise and advised her that biventricular pacing may be helpful to her as well.

## 2011-07-18 NOTE — Assessment & Plan Note (Signed)
Renal function deteriorated modestly a few months ago, but has now stabilized closer to her baseline.  This may have reflected a component of congestive heart failure.

## 2011-07-23 ENCOUNTER — Encounter: Payer: Self-pay | Admitting: Internal Medicine

## 2011-07-23 ENCOUNTER — Ambulatory Visit (INDEPENDENT_AMBULATORY_CARE_PROVIDER_SITE_OTHER): Payer: Medicaid Other | Admitting: Internal Medicine

## 2011-07-23 VITALS — BP 102/70 | HR 64 | Resp 16 | Ht 66.0 in | Wt 187.0 lb

## 2011-07-23 DIAGNOSIS — I428 Other cardiomyopathies: Secondary | ICD-10-CM

## 2011-07-23 DIAGNOSIS — I5022 Chronic systolic (congestive) heart failure: Secondary | ICD-10-CM

## 2011-07-23 DIAGNOSIS — I4891 Unspecified atrial fibrillation: Secondary | ICD-10-CM

## 2011-07-23 DIAGNOSIS — Z950 Presence of cardiac pacemaker: Secondary | ICD-10-CM

## 2011-07-23 LAB — PACEMAKER DEVICE OBSERVATION
BMOD-0002RV: 8
BMOD-0003RV: 30
BRDY-0004RV: 130 {beats}/min
RV LEAD AMPLITUDE: 9.2 mv
RV LEAD THRESHOLD: 0.9 V

## 2011-07-23 NOTE — Patient Instructions (Signed)
Your physician recommends that you schedule a follow-up appointment in: 6 month follow up

## 2011-07-24 ENCOUNTER — Other Ambulatory Visit: Payer: Self-pay | Admitting: Cardiology

## 2011-07-24 ENCOUNTER — Encounter: Payer: Self-pay | Admitting: Internal Medicine

## 2011-07-24 DIAGNOSIS — I5022 Chronic systolic (congestive) heart failure: Secondary | ICD-10-CM | POA: Insufficient documentation

## 2011-07-24 NOTE — Assessment & Plan Note (Signed)
Her rate is well controlled with underlying CHB. She will continue anti-coagulation.

## 2011-07-24 NOTE — Assessment & Plan Note (Signed)
Her device is working normally. She has several years of battery longevity remaining.

## 2011-07-24 NOTE — Assessment & Plan Note (Signed)
She has class 1 symptoms currently. I discussed the likely need to eventually upgrade her to a BiV ICD or at least a BiV PPM. As she is currently class 1, will undergo watchful waiting. If her CHF worsens or if she has syncope, then device upgrade would be indicated.

## 2011-07-24 NOTE — Progress Notes (Signed)
HPI [name] returns today for followup. She has a h/o essentially asymptomatic LV dysfunction. She has had an EF of 25% for over a decade. She underwent an esophageal procedure several weeks ago and was diagnosed after with an acute CHF exacerbation. She was diuresed and is back to baseline. When she is questioned carefully, she denies any additional symptoms. No chest pain or syncope.  Allergies  Allergen Reactions  . Wheat Swelling  . Latex Rash  . Penicillins Rash  . Sulfa Antibiotics Rash     Current Outpatient Prescriptions  Medication Sig Dispense Refill  . allopurinol (ZYLOPRIM) 100 MG tablet Take 150 mg by mouth daily.       . benazepril (LOTENSIN) 20 MG tablet Take 20 mg by mouth daily.        . carvedilol (COREG) 25 MG tablet Take 25 mg by mouth 2 (two) times daily with a meal.      . cholecalciferol (VITAMIN D) 1000 UNITS tablet Take 1,000 Units by mouth daily.      . digoxin (LANOXIN) 0.125 MG tablet Take 125 mcg by mouth as directed. 5 days/ week M-F      . furosemide (LASIX) 80 MG tablet Take 1 tablet (80 mg total) by mouth 2 (two) times daily.  30 tablet  12  . insulin glargine (LANTUS) 100 UNIT/ML injection Inject 30-40 Units into the skin 2 (two) times daily. Inject 40 units in am and 30 units at bedtime      . insulin NPH (HUMULIN N,NOVOLIN N) 100 UNIT/ML injection Inject 34 Units into the skin at bedtime.       . pantoprazole (PROTONIX) 40 MG tablet Take 40 mg by mouth daily.       Marland Kitchen spironolactone (ALDACTONE) 25 MG tablet Take 25 mg by mouth daily.        Marland Kitchen DISCONTD: warfarin (COUMADIN) 5 MG tablet Take 5 mg by mouth daily. Take as directed        . bismuth-metronidazole-tetracycline (PYLERA) 140-125-125 MG per capsule Take 3 capsules by mouth 4 (four) times daily -  before meals and at bedtime. FOR 10 DAYS  120 capsule  0  . COUMADIN 5 MG tablet Take 1 tablet (5 mg total) by mouth as directed.  30 each  2  . insulin aspart (NOVOLOG) 100 UNIT/ML injection Inject  12-20 Units into the skin 3 (three) times daily. Inject 12 units at 8:00am, 16 units at lunch and 20 units at suppertime         Past Medical History  Diagnosis Date  . Chronic kidney disease     creatinine- 1.8 in 09/2006 and 2.0 in 05/2007; 2.05 in 2011, 2.29 in 2012  . Reversible ischemic neurological deficit     embolic  . Diabetes mellitus     insulin treated  . Congenital third degree heart block     Guidant VVI pacemaker implanted in 09/1997; 8generator change in 01/2004   . Chronic atrial fibrillation     embolic rind  . Cardiomyopathy     with congestive heart failure   ejection fraction of 30% in 09/2003  . Chronic anticoagulation   . Stroke 1999  . CHF (congestive heart failure)     ROS:   All systems reviewed and negative except as noted in the HPI.   Past Surgical History  Procedure Date  . Insert / replace / remove pacemaker 09/1997 w/gen. change in 01/2004    Guidant VVI [employer]  . Colonoscopy 04/23/2011  Procedure: COLONOSCOPY;  Surgeon: Dorothyann Peng, MD;  Location: [facility];  Service: Endoscopy;  Laterality: N/A;  12:30     Family History  Problem Relation Age of Onset  . Adopted: Yes  . Colon cancer Neg Hx      History   Social History  . Marital Status: Married    Spouse Name: N/A    Number of Children: 4  . Years of Education: N/A   Occupational History  .     Social History Main Topics  . Smoking status: Never Smoker   . Smokeless tobacco: Never Used  . Alcohol Use: No  . Drug Use: No  . Sexually Active: Not on file   Other Topics Concern  . Not on file   Social History Narrative  . No narrative on file     BP 102/70  Pulse 64  Resp 16  Ht 5\' 6"  (1.676 m)  Wt 187 lb (84.823 kg)  BMI 30.18 kg/m2  Physical Exam:  Well appearing middle aged woman,NAD HEENT: Unremarkable Neck:  No JVD, no thyromegally Lungs:  Clear with no wheezes HEART:  Regular rate rhythm, no murmurs, no rubs, no clicks, split  S2. Abd:  soft, positive bowel sounds, no organomegally, no rebound, no guarding Ext:  2 plus pulses, no edema, no cyanosis, no clubbing Skin:  No rashes no nodules Neuro:  CN II through XII intact, motor grossly intact  DEVICE  Normal device function.  See PaceArt for details.   Assess/Plan:

## 2011-07-27 ENCOUNTER — Other Ambulatory Visit: Payer: Self-pay | Admitting: Cardiology

## 2011-07-29 ENCOUNTER — Telehealth: Payer: Self-pay | Admitting: *Deleted

## 2011-07-29 MED ORDER — COUMADIN 5 MG PO TABS: 5.0000 mg | ORAL_TABLET | ORAL | Status: DC

## 2011-07-29 NOTE — Telephone Encounter (Signed)
Pt needs prior authorization for digoxin

## 2011-08-01 ENCOUNTER — Ambulatory Visit (INDEPENDENT_AMBULATORY_CARE_PROVIDER_SITE_OTHER): Payer: Medicaid Other | Admitting: *Deleted

## 2011-08-01 ENCOUNTER — Other Ambulatory Visit: Payer: Self-pay | Admitting: *Deleted

## 2011-08-01 DIAGNOSIS — I4891 Unspecified atrial fibrillation: Secondary | ICD-10-CM

## 2011-08-01 LAB — POCT INR: INR: 3

## 2011-08-01 MED ORDER — DIGOXIN 125 MCG PO TABS: 125.0000 ug | ORAL_TABLET | ORAL | Status: DC

## 2011-08-02 ENCOUNTER — Other Ambulatory Visit: Payer: Self-pay

## 2011-08-02 MED ORDER — DIGOXIN 125 MCG PO TABS: 125.0000 ug | ORAL_TABLET | ORAL | Status: DC

## 2011-08-06 ENCOUNTER — Other Ambulatory Visit: Payer: Self-pay | Admitting: *Deleted

## 2011-08-08 ENCOUNTER — Encounter: Payer: Self-pay | Admitting: Gastroenterology

## 2011-08-12 ENCOUNTER — Other Ambulatory Visit: Payer: Self-pay | Admitting: *Deleted

## 2011-08-13 ENCOUNTER — Other Ambulatory Visit: Payer: Self-pay | Admitting: *Deleted

## 2011-08-14 ENCOUNTER — Encounter: Payer: Self-pay | Admitting: Gastroenterology

## 2011-08-14 ENCOUNTER — Ambulatory Visit (INDEPENDENT_AMBULATORY_CARE_PROVIDER_SITE_OTHER): Payer: Medicaid Other | Admitting: Gastroenterology

## 2011-08-14 ENCOUNTER — Ambulatory Visit: Payer: Medicaid Other | Admitting: Gastroenterology

## 2011-08-14 VITALS — BP 92/68 | HR 62 | Temp 97.8°F | Ht 66.0 in | Wt 184.4 lb

## 2011-08-14 DIAGNOSIS — A048 Other specified bacterial intestinal infections: Secondary | ICD-10-CM

## 2011-08-14 DIAGNOSIS — K219 Gastro-esophageal reflux disease without esophagitis: Secondary | ICD-10-CM

## 2011-08-14 DIAGNOSIS — R1310 Dysphagia, unspecified: Secondary | ICD-10-CM

## 2011-08-14 DIAGNOSIS — R031 Nonspecific low blood-pressure reading: Secondary | ICD-10-CM | POA: Insufficient documentation

## 2011-08-14 DIAGNOSIS — B9681 Helicobacter pylori [H. pylori] as the cause of diseases classified elsewhere: Secondary | ICD-10-CM

## 2011-08-14 HISTORY — DX: Dysphagia, unspecified: R13.10

## 2011-08-14 HISTORY — DX: Helicobacter pylori (H. pylori) as the cause of diseases classified elsewhere: B96.81

## 2011-08-14 NOTE — Assessment & Plan Note (Signed)
TOLERATED AND COMPLETED PYLERA.

## 2011-08-14 NOTE — Assessment & Plan Note (Signed)
?   RELATED TOP COUGH. sX IMPROVED AFTER EGD/DIL.  CONTINUE PROTONIX FOREVER. OPV IN 4 MOS.

## 2011-08-14 NOTE — Assessment & Plan Note (Signed)
ON ZESTRIL AND COREG.  SPOKE WITH TOMI CHEEK. PT WILL TAKE 1/2 BENAZEPRIL DAILY. BP CHECK WITH DR. Izell [place] [facility] June 21 AT 1015 AM.

## 2011-08-14 NOTE — Patient Instructions (Addendum)
TAKE 1/2 BENAZEPRIL DAILY INSTEAD OF 1 PILL.  SEE DR. Izell [place] [facility] ON FRI June 21 AT 1015 AM FOR A BP CHECK.  FOLLOW UP IN 4 MOS.

## 2011-08-14 NOTE — Assessment & Plan Note (Signed)
SX RESOLVED.  CONTINUE PROTONIX. OPV IN 4 MOS.

## 2011-08-14 NOTE — Progress Notes (Signed)
  Subjective:    Patient ID: [name], female    DOB: Oct 11, 1960, 51 y.o.   MRN: [id number]  PCP: FANTA  HPI IF DRINKS DRINKS WITH ICE IN IT, CAN FEEL SICK AND THEN IT GOES AWAY. NOT COUGHING AND TAKES MEDS EVERY DAY LIKE PRESCRIBED. WATCHES WHAT [place]. TONGUE TURNS ORANGE EVERY AM AFTER TAKING PYLERA. NOT FEELING LIGHT HEADED, DIZZY. NO CP OR SOB. PT NOT ON LANOXIN FOR 2-3 WEEKS. LOST 6 LBS SINCE FEB.  Past Medical History  Diagnosis Date  . Chronic kidney disease     creatinine- 1.8 in 09/2006 and 2.0 in 05/2007; 2.05 in 2011, 2.29 in 2012  . Reversible ischemic neurological deficit     embolic  . Diabetes mellitus     insulin treated  . Congenital third degree heart block     Guidant VVI pacemaker implanted in 09/1997; 8generator change in 01/2004   . Chronic atrial fibrillation     embolic rind  . Cardiomyopathy     with congestive heart failure   ejection fraction of 30% in 09/2003  . Chronic anticoagulation   . Stroke 1999  . CHF (congestive heart failure)     Past Surgical History  Procedure Date  . Insert / replace / remove pacemaker 09/1997 w/gen. change in 01/2004    Guidant VVI [employer]  . Colonoscopy 04/23/2011    Ring, esophageal in the distal esophagus/ Mild gastritis/ DESCENDING COLON POLYP/ INTERNAL HEMORRHOIDS/ TORTUOUS COLON      Review of Systems     Objective:   Physical Exam  Constitutional: She is oriented to person, place, and time. She appears well-developed. No distress.  HENT:  Head: Normocephalic and atraumatic.  Mouth/Throat: Oropharynx is clear and moist. No oropharyngeal exudate.  Eyes: Pupils are equal, round, and reactive to light. No scleral icterus.  Neck: Normal range of motion. Neck supple.  Cardiovascular: Regular rhythm.   Murmur heard. Pulmonary/Chest: Effort normal and breath sounds normal. No respiratory distress.  Abdominal: Soft. Bowel sounds are normal. She exhibits no distension. There is no tenderness.    Musculoskeletal: She exhibits no edema.       VARICOSE VEINS BIL LE  Neurological: She is alert and oriented to person, place, and time.       NO FOCAL DEFICITS   Psychiatric:       ANXIOUS MOOD, NL AFFECT          Assessment & Plan:

## 2011-08-15 NOTE — Progress Notes (Signed)
Faxed to PCP

## 2011-08-16 ENCOUNTER — Ambulatory Visit (INDEPENDENT_AMBULATORY_CARE_PROVIDER_SITE_OTHER): Payer: Medicaid Other | Admitting: *Deleted

## 2011-08-16 ENCOUNTER — Other Ambulatory Visit: Payer: Self-pay | Admitting: *Deleted

## 2011-08-16 VITALS — BP 102/74 | HR 61

## 2011-08-16 DIAGNOSIS — I10 Essential (primary) hypertension: Secondary | ICD-10-CM

## 2011-08-16 DIAGNOSIS — I959 Hypotension, unspecified: Secondary | ICD-10-CM

## 2011-08-16 NOTE — Progress Notes (Signed)
Presents today for blood pressure check per request of Dr Artis Flock.  Pt was in her office on 6/19 with BP 92/68.  Was advised, at that time to decrease benazepril to 10 mg daily and come for bp check today.  BP 102/74, without c/o symptoms.  Advised pt to take bp at home this weekend, continue 10 mg of benazepril and we would recheck BP on Monday with Coumadin visit.

## 2011-08-19 ENCOUNTER — Ambulatory Visit (INDEPENDENT_AMBULATORY_CARE_PROVIDER_SITE_OTHER): Payer: Medicaid Other | Admitting: *Deleted

## 2011-08-19 DIAGNOSIS — I4891 Unspecified atrial fibrillation: Secondary | ICD-10-CM

## 2011-08-22 NOTE — Progress Notes (Signed)
Reminder in epic to follow up in 4 months with SF in E30

## 2011-09-05 ENCOUNTER — Ambulatory Visit (INDEPENDENT_AMBULATORY_CARE_PROVIDER_SITE_OTHER): Payer: Medicaid Other | Admitting: *Deleted

## 2011-09-05 DIAGNOSIS — I4891 Unspecified atrial fibrillation: Secondary | ICD-10-CM

## 2011-09-10 ENCOUNTER — Encounter: Payer: Self-pay | Admitting: *Deleted

## 2011-09-10 ENCOUNTER — Other Ambulatory Visit: Payer: Self-pay | Admitting: *Deleted

## 2011-09-10 DIAGNOSIS — I10 Essential (primary) hypertension: Secondary | ICD-10-CM

## 2011-09-12 ENCOUNTER — Ambulatory Visit (INDEPENDENT_AMBULATORY_CARE_PROVIDER_SITE_OTHER): Payer: Medicaid Other | Admitting: *Deleted

## 2011-09-12 DIAGNOSIS — I4891 Unspecified atrial fibrillation: Secondary | ICD-10-CM

## 2011-09-12 LAB — POCT INR: INR: 3.1

## 2011-10-01 ENCOUNTER — Telehealth: Payer: Self-pay | Admitting: Cardiology

## 2011-10-01 MED ORDER — FUROSEMIDE 80 MG PO TABS: 80.0000 mg | ORAL_TABLET | Freq: Two times a day (BID) | ORAL | Status: DC

## 2011-10-01 NOTE — Telephone Encounter (Signed)
Patient states that she needs RX for Furosemide sent to [facility] for 60 tabs because she is taking 2 a day. / tg

## 2011-10-02 ENCOUNTER — Ambulatory Visit (INDEPENDENT_AMBULATORY_CARE_PROVIDER_SITE_OTHER): Payer: Medicaid Other | Admitting: *Deleted

## 2011-10-02 DIAGNOSIS — I4891 Unspecified atrial fibrillation: Secondary | ICD-10-CM

## 2011-10-14 ENCOUNTER — Telehealth: Payer: Self-pay | Admitting: Cardiology

## 2011-10-14 ENCOUNTER — Other Ambulatory Visit: Payer: Self-pay | Admitting: Cardiology

## 2011-10-14 ENCOUNTER — Other Ambulatory Visit: Payer: Self-pay | Admitting: *Deleted

## 2011-10-14 MED ORDER — CARVEDILOL 25 MG PO TABS: 25.0000 mg | ORAL_TABLET | Freq: Two times a day (BID) | ORAL | Status: DC

## 2011-10-14 NOTE — Telephone Encounter (Signed)
Script faxed as requested.

## 2011-10-14 NOTE — Telephone Encounter (Signed)
[facility] called and needs Coreg script faxed to them at [phone], it can't be sent electronically.

## 2011-10-15 ENCOUNTER — Other Ambulatory Visit: Payer: Self-pay | Admitting: Cardiology

## 2011-10-16 ENCOUNTER — Other Ambulatory Visit: Payer: Self-pay | Admitting: Cardiology

## 2011-10-17 ENCOUNTER — Encounter: Payer: Self-pay | Admitting: Cardiology

## 2011-10-17 ENCOUNTER — Other Ambulatory Visit: Payer: Self-pay | Admitting: Cardiology

## 2011-10-17 LAB — BASIC METABOLIC PANEL
Glucose, Bld: 246 mg/dL — ABNORMAL HIGH (ref 70–99)
Potassium: 4.5 mEq/L (ref 3.5–5.3)
Sodium: 136 mEq/L (ref 135–145)

## 2011-10-17 LAB — MAGNESIUM: Magnesium: 1.7 mg/dL (ref 1.5–2.5)

## 2011-10-18 ENCOUNTER — Encounter: Payer: Self-pay | Admitting: *Deleted

## 2011-10-18 ENCOUNTER — Ambulatory Visit (INDEPENDENT_AMBULATORY_CARE_PROVIDER_SITE_OTHER): Payer: Medicaid Other | Admitting: Cardiology

## 2011-10-18 ENCOUNTER — Encounter: Payer: Self-pay | Admitting: Cardiology

## 2011-10-18 VITALS — BP 99/62 | HR 59 | Ht 65.0 in | Wt 189.1 lb

## 2011-10-18 DIAGNOSIS — R1310 Dysphagia, unspecified: Secondary | ICD-10-CM

## 2011-10-18 DIAGNOSIS — Z7901 Long term (current) use of anticoagulants: Secondary | ICD-10-CM

## 2011-10-18 DIAGNOSIS — I428 Other cardiomyopathies: Secondary | ICD-10-CM

## 2011-10-18 NOTE — Progress Notes (Signed)
Patient ID: [name], female   DOB: October 16, 1960, 51 y.o.   MRN: [id number]  HPI: Scheduled return visit for this very nice woman with long-standing cardiomyopathy.  Since adjustment of her medications, exertional dyspnea and fatigue has subsided.  Lifestyle is sedentary, but she reports no limitations and no exertional symptoms.  She has been recently evaluated by Dr. Lovena Le who finds her to have class I CHF and thus no indication for biventricular pacing at the present time.  Prior to Admission medications   Medication Sig Start Date End Date Taking? Authorizing Provider  ACCU-CHEK AVIVA PLUS test strip  06/26/11  Yes Historical Provider, MD  allopurinol (ZYLOPRIM) 100 MG tablet Take 150 mg by mouth daily.    Yes Historical Provider, MD  benazepril (LOTENSIN) 20 MG tablet Take 10 mg by mouth daily.    Yes Historical Provider, MD  cholecalciferol (VITAMIN D) 1000 UNITS tablet Take 1,000 Units by mouth daily.   Yes Historical Provider, MD  COLCRYS 0.6 MG tablet Take 0.6 mg by mouth as needed.  06/27/11  Yes Historical Provider, MD  COREG 12.5 MG tablet TAKE (2) TABLETS BY MOUTH TWICE DAILY. 10/14/11  Yes Yehuda Savannah, MD  digoxin (LANOXIN) 0.125 MG tablet Take 1 tablet (125 mcg total) by mouth as directed. 5 days/ week M-F 08/02/11  Yes Yehuda Savannah, MD  furosemide (LASIX) 80 MG tablet Take 1 tablet (80 mg total) by mouth 2 (two) times daily. 10/01/11 09/30/12 Yes Yehuda Savannah, MD  insulin aspart (NOVOLOG) 100 UNIT/ML injection Inject 12-20 Units into the skin 3 (three) times daily. Inject 12 units at 8:00am, 16 units at lunch and 20 units at suppertime   Yes Historical Provider, MD  insulin glargine (LANTUS) 100 UNIT/ML injection Inject 30-40 Units into the skin 2 (two) times daily. Inject 40 units in am and 30 units at bedtime   Yes Historical Provider, MD  insulin NPH (HUMULIN N,NOVOLIN N) 100 UNIT/ML injection Inject 34 Units into the skin at bedtime.    Yes Historical Provider, MD    pantoprazole (PROTONIX) 40 MG tablet Take 40 mg by mouth daily.    Yes Historical Provider, MD  QC PEN NEEDLES 31G X 8 MM MISC  06/26/11  Yes Historical Provider, MD  spironolactone (ALDACTONE) 25 MG tablet Take 25 mg by mouth daily.     Yes Historical Provider, MD  warfarin (COUMADIN) 5 MG tablet Take 5 mg by mouth as directed. 1/2 everyday except Thursday whole tablet 07/29/11  Yes Yehuda Savannah, MD  bismuth-metronidazole-tetracycline [facility]) [phone] MG per capsule Take 3 capsules by mouth 4 (four) times daily -  before meals and at bedtime. FOR 10 DAYS 05/01/11 05/15/11  Danie Binder, MD   Allergies  Allergen Reactions  . Wheat Swelling  . Latex Rash  . Penicillins Rash  . Sulfa Antibiotics Rash     Past medical history, social history, and family history reviewed and updated.  ROS: Denies orthopnea, PND, pedal edema.  PHYSICAL EXAM: BP 99/62  Pulse 59  Ht 5\' 5"  (1.651 m)  Wt 85.766 kg (189 lb 1.3 oz)  BMI 31.46 kg/m2 ; weight increased 5 pounds since 07/2011, but only 2 pounds since her last visit with me. General-Well developed; no acute distress Body habitus-proportionate weight and height Neck-No JVD; Slight HJR; no carotid bruits Lungs-clear lung fields; resonant to percussion Cardiovascular-normal PMI; normal S1 and S2; modest systolic ejection murmur Abdomen-normal bowel sounds; soft and non-tender without masses or organomegaly Musculoskeletal-No  deformities, no cyanosis or clubbing Neurologic-Normal cranial nerves; symmetric strength and tone Skin-Warm, no significant lesions Extremities-distal pulses intact; no edema  ASSESSMENT AND PLAN:  [name], MD 10/18/2011 12:02 PM

## 2011-10-18 NOTE — Assessment & Plan Note (Signed)
A recent CBC will be sought from patient's nephrologist.  Stool samples for Hemoccult testing requested.

## 2011-10-18 NOTE — Patient Instructions (Addendum)
Your physician recommends that you schedule a follow-up appointment in: 6 months  Your physician recommends that you return for lab work in: 3 months (add CBC to labs if done with Dr Rosalin Hawking and have them fax to [phone]) and 6 months BMET   Stool cards x 3

## 2011-10-18 NOTE — Progress Notes (Deleted)
Name: [name]    DOB: October 17, 1960  Age: 51 y.o.  MR#: [id number]       PCP:  [name], MD      Insurance: @PAYORNAME @   CC:   No chief complaint on file.   VS BP 99/62  Pulse 59  Ht 5\' 5"  (1.651 m)  Wt 189 lb 1.3 oz (85.766 kg)  BMI 31.46 kg/m2  Weights Current Weight  10/18/11 189 lb 1.3 oz (85.766 kg)  08/14/11 184 lb 6.4 oz (83.643 kg)  07/23/11 187 lb (84.823 kg)    Blood Pressure  BP Readings from Last 3 Encounters:  10/18/11 99/62  08/16/11 102/74  08/14/11 92/68     Admit date:  (Not on file) Last encounter with RMR:  10/17/2011   Allergy Allergies  Allergen Reactions  . Wheat Swelling  . Latex Rash  . Penicillins Rash  . Sulfa Antibiotics Rash    Current Outpatient Prescriptions  Medication Sig Dispense Refill  . ACCU-CHEK AVIVA PLUS test strip       . allopurinol (ZYLOPRIM) 100 MG tablet Take 150 mg by mouth daily.       . benazepril (LOTENSIN) 20 MG tablet Take 10 mg by mouth daily.       . cholecalciferol (VITAMIN D) 1000 UNITS tablet Take 1,000 Units by mouth daily.      Marland Kitchen COLCRYS 0.6 MG tablet Take 0.6 mg by mouth as needed.       . COREG 12.5 MG tablet TAKE (2) TABLETS BY MOUTH TWICE DAILY.  60 each  6  . digoxin (LANOXIN) 0.125 MG tablet Take 1 tablet (125 mcg total) by mouth as directed. 5 days/ week M-F  30 tablet  12  . furosemide (LASIX) 80 MG tablet Take 1 tablet (80 mg total) by mouth 2 (two) times daily.  60 tablet  12  . insulin aspart (NOVOLOG) 100 UNIT/ML injection Inject 12-20 Units into the skin 3 (three) times daily. Inject 12 units at 8:00am, 16 units at lunch and 20 units at suppertime      . insulin glargine (LANTUS) 100 UNIT/ML injection Inject 30-40 Units into the skin 2 (two) times daily. Inject 40 units in am and 30 units at bedtime      . insulin NPH (HUMULIN N,NOVOLIN N) 100 UNIT/ML injection Inject 34 Units into the skin at bedtime.       . pantoprazole (PROTONIX) 40 MG tablet Take 40 mg by mouth daily.       . QC PEN  NEEDLES 31G X 8 MM MISC       . spironolactone (ALDACTONE) 25 MG tablet Take 25 mg by mouth daily.        Marland Kitchen warfarin (COUMADIN) 5 MG tablet Take 5 mg by mouth as directed. 1/2 everyday except Thursday whole tablet      . DISCONTD: COUMADIN 5 MG tablet Take 1 tablet (5 mg total) by mouth as directed.  30 tablet  3  . bismuth-metronidazole-tetracycline (PYLERA) 140-125-125 MG per capsule Take 3 capsules by mouth 4 (four) times daily -  before meals and at bedtime. FOR 10 DAYS  120 capsule  0  . DISCONTD: carvedilol (COREG) 25 MG tablet Take 1 tablet (25 mg total) by mouth 2 (two) times daily with a meal.  60 tablet  11    Discontinued Meds:    Medications Discontinued During This Encounter  Medication Reason  . COUMADIN 5 MG tablet   . carvedilol (COREG) 25 MG tablet Duplicate  Patient Active Problem List  Diagnosis  . AODM  . CARDIOMYOPATHY  . ATRIAL FIBRILLATION, CHRONIC  . Chronic kidney disease, stage IV (severe)  . Status post biventricular cardiac pacemaker insertion  . Chronic anticoagulation  . Congenital third degree heart block  . Reversible ischemic neurological deficit  . Hyperkalemia  . Laboratory test  . Chronic systolic congestive heart failure, NYHA class 1  . Dysphagia  . Helicobacter pylori gastritis  . GERD (gastroesophageal reflux disease)  . Low blood pressure reading    LABS Orders Only on 10/16/2011  Component Date Value  . Sodium 10/16/2011 136   . Potassium 10/16/2011 4.5   . Chloride 10/16/2011 101   . CO2 10/16/2011 25   . Glucose, Bld 10/16/2011 246*  . BUN 10/16/2011 60*  . Creat 10/16/2011 2.17*  . Calcium 10/16/2011 9.6   . Magnesium 10/16/2011 1.7   Anti-coag visit on 10/02/2011  Component Date Value  . INR 10/02/2011 2.0   Anti-coag visit on 09/12/2011  Component Date Value  . INR 09/12/2011 3.1   Anti-coag visit on 09/05/2011  Component Date Value  . INR 09/05/2011 1.7   Anti-coag visit on 08/19/2011  Component Date Value  .  INR 08/19/2011 3.3   Anti-coag visit on 08/01/2011  Component Date Value  . INR 08/01/2011 3.0   Office Visit on 07/23/2011  Component Date Value  . DEVICE MODEL PM 07/23/2011 [id number]   . [id number] 07/23/2011 [name]   M.D.   . PACEART TECH NOTES PM 07/23/2011                     Value:Pacemaker check in clinic. Normal device function. Thresholds, sensing, impedances consistent with previous measurements. Device programmed to maximize longevity. No mode switch or high ventricular rates noted. Device programmed at appropriate safety                          margins. Histogram distribution appropriate for patient activity level. Device programmed to optimize intrinsic conduction.  Patient education completed.  ROV 6 months with the device clinic.  Marland Kitchen RV LEAD AMPLITUDE 07/23/2011 9.2   . RV LEAD THRESHOLD 07/23/2011 0.9   . BRDY-0001RV 07/23/2011 SSIR   . BRDY-0002RV 07/23/2011 60   . BRDY-0004RV 07/23/2011 130   . BRDY-0005RV 07/23/2011 Off   . BRDY-0009RV 07/23/2011 No   . BMOD-0001RV 07/23/2011 Medium   . BMOD-0002RV 07/23/2011 8   . BMOD-0003RV 07/23/2011 30   . BMOD-0004RV 07/23/2011 5      Results for this Opt Visit:     Results for orders placed in visit on [id number]  BASIC METABOLIC PANEL      Component Value Range   Sodium 136  135 - 145 mEq/L   Potassium 4.5  3.5 - 5.3 mEq/L   Chloride 101  96 - 112 mEq/L   CO2 25  19 - 32 mEq/L   Glucose, Bld 246 (*) 70 - 99 mg/dL   BUN 60 (*) 6 - 23 mg/dL   Creat 2.17 (*) 0.50 - 1.10 mg/dL   Calcium 9.6  8.4 - 10.5 mg/dL  MAGNESIUM      Component Value Range   Magnesium 1.7  1.5 - 2.5 mg/dL    EKG Orders placed in visit on 06/05/11  . CARDIAC EVENT MONITOR     Prior Assessment and Plan Problem List as of 10/18/2011            Cardiology Problems  CARDIOMYOPATHY   Last Assessment & Plan Note   07/18/2011 Office Visit Addendum 07/20/2011 10:41 AM by Yehuda Savannah, MD    Patient has diuresed 10 pounds with a  concomitant decrease in symptoms and continues to improve despite no additional weight loss.  Her BNP level has decreased from a peak of 4835 to 357 earlier this month.  Although she had no radiographic evidence nor prominent physical findings for congestive heart failure, did not present with substantial weight gain, and did not improve with initial diuresis, it appears that congestive heart failure has been the problem in recent months.  I have endorsed her strategy of gradually increasing her level of exercise and advised her that biventricular pacing may be helpful to her as well.    ATRIAL FIBRILLATION, CHRONIC   Last Assessment & Plan Note   07/23/2011 Office Visit Signed 07/24/2011 10:14 PM by Evans Lance, MD    Her rate is well controlled with underlying CHB. She will continue anti-coagulation.    Congenital third degree heart block   Last Assessment & Plan Note   04/30/2011 Office Visit Signed 04/30/2011  3:51 PM by Lendon Colonel, NP    She sees Dr. Lovena Le in May for face to face pacemaker interrogation.    Reversible ischemic neurological deficit   Chronic systolic congestive heart failure, NYHA class 1   Last Assessment & Plan Note   07/23/2011 Office Visit Signed 07/24/2011 10:17 PM by Evans Lance, MD    She has class 1 symptoms currently. I discussed the likely need to eventually upgrade her to a BiV ICD or at least a BiV PPM. As she is currently class 1, will undergo watchful waiting. If her CHF worsens or if she has syncope, then device upgrade would be indicated.    Low blood pressure reading   Last Assessment & Plan Note   08/14/2011 Office Visit Signed 08/14/2011 11:02 AM by Danie Binder, MD    ON [place].  SPOKE WITH Mavin Dyke CHEEK. PT WILL TAKE 1/2 BENAZEPRIL DAILY. BP CHECK WITH DR. Izell Gibson [facility] June 21 AT 1015 AM.      Other   AODM   Last Assessment & Plan Note   06/18/2011 Office Visit Addendum 06/21/2011  3:46 PM by Yehuda Savannah, MD    Recent  control has been suboptimal based upon results from metabolic profiles.  Referred back to Dr. Legrand Rams for additional evaluation and treatment and to Dr. Dorris Fetch as necessary.    Chronic kidney disease, stage IV (severe)   Last Assessment & Plan Note   07/18/2011 Office Visit Signed 07/18/2011  4:18 PM by Yehuda Savannah, MD    Renal function deteriorated modestly a few months ago, but has now stabilized closer to her baseline.  This may have reflected a component of congestive heart failure.    Status post biventricular cardiac pacemaker insertion   Last Assessment & Plan Note   07/23/2011 Office Visit Signed 07/24/2011 10:13 PM by Evans Lance, MD    Her device is working normally. She has several years of battery longevity remaining.    Chronic anticoagulation   Last Assessment & Plan Note   07/18/2011 Office Visit Signed 07/18/2011  4:17 PM by Yehuda Savannah, MD    Stable and therapeutic anticoagulation for many years without adverse consequences.    Hyperkalemia   Last Assessment & Plan Note   05/28/2011 Office Visit Signed 05/29/2011  9:14 AM by Yehuda Savannah,  MD    Potassium supplementation discontinued and a low potassium diet instituted.  Magnesium supplementation will also be discontinued in the face of chronic kidney disease.    Laboratory test   Dysphagia   Last Assessment & Plan Note   08/14/2011 Office Visit Signed 08/14/2011 10:58 AM by Danie Binder, MD    SX RESOLVED.  CONTINUE PROTONIX. OPV IN 4 MOS.    Helicobacter pylori gastritis   Last Assessment & Plan Note   08/14/2011 Office Visit Signed 08/14/2011 10:59 AM by Danie Binder, MD    [place].    GERD (gastroesophageal reflux disease)   Last Assessment & Plan Note   08/14/2011 Office Visit Signed 08/14/2011 11:00 AM by Danie Binder, MD    ? RELATED TOP COUGH. sX IMPROVED AFTER EGD/DIL.  CONTINUE PROTONIX FOREVER. OPV IN 4 MOS.        Imaging: No results found.   FRS  Calculation: Score not calculated. Missing: Total Cholesterol

## 2011-10-18 NOTE — Assessment & Plan Note (Signed)
Patient has returned to her previous long-standing asymptomatic status with current medical therapy, which will be continued.  Electrolytes and renal function will continue to be monitored by her nephrologist.  We will request a copy of his results.

## 2011-10-21 ENCOUNTER — Other Ambulatory Visit: Payer: Self-pay | Admitting: *Deleted

## 2011-10-21 NOTE — Telephone Encounter (Signed)
Patient is currently out of Brand Coreg, as she states she cannot take Carvedilol. Patient has been approved thru document for safety for the brand medication, however Medicaid is still denying her prescription, per Nira Conn at [place].  Per Dr Lattie Haw, it is permissible for this patient to take carvedilol. Contacted patient and advised her of Dr Izell [place] recommendations.  Pt refuses at this time and states that she is going to the health department tomorrow to see if she can get additional information, as I advised her to contact her case worker, who can be of a great deal of help to her.

## 2011-10-23 ENCOUNTER — Telehealth: Payer: Self-pay | Admitting: *Deleted

## 2011-10-23 NOTE — Telephone Encounter (Signed)
Patient, husband and daughter walked in office today with more questions related to her Coreg.  They have switched pharmacies to [facility] and are trying to understand the process by which the [employer] works.  Copy of her approval thru the [employer] program provided to daughter, along with the message that states Coreg is currently authorized under BRANDS program for this patient and no further action is needed at this time.  A copy of her Coreg prescription with Brand Necessary printed on the prescription will be faxed to [facility].  Daughter verbalized understanding and I advised them, that with any insurance company, if there are issues, after protocol has been followed by the pharmacy and the prescriber, they need to contact Medicaid with further questions as to why this may still be denied.

## 2011-10-24 ENCOUNTER — Ambulatory Visit (INDEPENDENT_AMBULATORY_CARE_PROVIDER_SITE_OTHER): Payer: Medicaid Other | Admitting: *Deleted

## 2011-10-24 DIAGNOSIS — I4891 Unspecified atrial fibrillation: Secondary | ICD-10-CM

## 2011-11-04 ENCOUNTER — Telehealth: Payer: Self-pay | Admitting: Cardiology

## 2011-11-04 MED ORDER — DIGOXIN 125 MCG PO TABS: 125.0000 ug | ORAL_TABLET | ORAL | Status: DC

## 2011-11-04 NOTE — Telephone Encounter (Signed)
PT WOULD LIKE [place] TO CALL ALL HER MEDICATIONS INTO [facility]. THEY ARE HELPING HER GET EVERYTHING STRAIGHTENED OUT.

## 2011-11-21 ENCOUNTER — Ambulatory Visit (INDEPENDENT_AMBULATORY_CARE_PROVIDER_SITE_OTHER): Payer: Medicaid Other | Admitting: *Deleted

## 2011-11-21 ENCOUNTER — Encounter: Payer: Self-pay | Admitting: *Deleted

## 2011-11-21 DIAGNOSIS — I4891 Unspecified atrial fibrillation: Secondary | ICD-10-CM

## 2011-11-25 ENCOUNTER — Emergency Department (HOSPITAL_COMMUNITY): Payer: No Typology Code available for payment source

## 2011-11-25 ENCOUNTER — Emergency Department (HOSPITAL_COMMUNITY)
Admission: EM | Admit: 2011-11-25 | Discharge: 2011-11-25 | Disposition: A | Discharge status: Home / Self Care | Payer: No Typology Code available for payment source | Attending: Emergency Medicine | Admitting: Emergency Medicine

## 2011-11-25 ENCOUNTER — Encounter (HOSPITAL_COMMUNITY): Payer: Self-pay | Admitting: *Deleted

## 2011-11-25 DIAGNOSIS — I4891 Unspecified atrial fibrillation: Secondary | ICD-10-CM | POA: Insufficient documentation

## 2011-11-25 DIAGNOSIS — Z880 Allergy status to penicillin: Secondary | ICD-10-CM | POA: Insufficient documentation

## 2011-11-25 DIAGNOSIS — S335XXA Sprain of ligaments of lumbar spine, initial encounter: Secondary | ICD-10-CM | POA: Insufficient documentation

## 2011-11-25 DIAGNOSIS — I509 Heart failure, unspecified: Secondary | ICD-10-CM | POA: Insufficient documentation

## 2011-11-25 DIAGNOSIS — S161XXA Strain of muscle, fascia and tendon at neck level, initial encounter: Secondary | ICD-10-CM

## 2011-11-25 DIAGNOSIS — E119 Type 2 diabetes mellitus without complications: Secondary | ICD-10-CM | POA: Insufficient documentation

## 2011-11-25 DIAGNOSIS — N189 Chronic kidney disease, unspecified: Secondary | ICD-10-CM | POA: Insufficient documentation

## 2011-11-25 DIAGNOSIS — Z950 Presence of cardiac pacemaker: Secondary | ICD-10-CM | POA: Insufficient documentation

## 2011-11-25 DIAGNOSIS — S39012A Strain of muscle, fascia and tendon of lower back, initial encounter: Secondary | ICD-10-CM

## 2011-11-25 DIAGNOSIS — S139XXA Sprain of joints and ligaments of unspecified parts of neck, initial encounter: Secondary | ICD-10-CM | POA: Insufficient documentation

## 2011-11-25 DIAGNOSIS — I428 Other cardiomyopathies: Secondary | ICD-10-CM | POA: Insufficient documentation

## 2011-11-25 DIAGNOSIS — Z882 Allergy status to sulfonamides status: Secondary | ICD-10-CM | POA: Insufficient documentation

## 2011-11-25 DIAGNOSIS — K219 Gastro-esophageal reflux disease without esophagitis: Secondary | ICD-10-CM | POA: Insufficient documentation

## 2011-11-25 DIAGNOSIS — Z91040 Latex allergy status: Secondary | ICD-10-CM | POA: Insufficient documentation

## 2011-11-25 DIAGNOSIS — Z8673 Personal history of transient ischemic attack (TIA), and cerebral infarction without residual deficits: Secondary | ICD-10-CM | POA: Insufficient documentation

## 2011-11-25 MED ORDER — CYCLOBENZAPRINE HCL 5 MG PO TABS: 5.0000 mg | ORAL_TABLET | Freq: Three times a day (TID) | ORAL | Status: DC | PRN

## 2011-11-25 MED ORDER — ACETAMINOPHEN 325 MG PO TABS
ORAL_TABLET | ORAL | Status: AC
  Filled 2011-11-25: qty 1

## 2011-11-25 MED ORDER — ACETAMINOPHEN 325 MG PO TABS
325.0000 mg | ORAL_TABLET | Freq: Once | ORAL | Status: AC
  Administered 2011-11-25: 325 mg via ORAL
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
## 2011-11-25 NOTE — ED Notes (Signed)
J. Idol, PA at bedside. 

## 2011-11-27 NOTE — ED Provider Notes (Signed)
Medical screening examination/treatment/procedure(s) were performed by non-physician practitioner and as supervising physician I was immediately available for consultation/collaboration.  Mervin Kung, MD 11/27/11 [phone]
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
## 2011-12-19 ENCOUNTER — Ambulatory Visit (INDEPENDENT_AMBULATORY_CARE_PROVIDER_SITE_OTHER): Payer: Medicaid Other | Admitting: *Deleted

## 2011-12-19 DIAGNOSIS — I4891 Unspecified atrial fibrillation: Secondary | ICD-10-CM

## 2011-12-19 LAB — POCT INR: INR: 1.8

## 2011-12-25 ENCOUNTER — Ambulatory Visit (INDEPENDENT_AMBULATORY_CARE_PROVIDER_SITE_OTHER): Payer: Medicaid Other | Admitting: Gastroenterology

## 2011-12-25 ENCOUNTER — Encounter: Payer: Self-pay | Admitting: Gastroenterology

## 2011-12-25 VITALS — BP 110/63 | HR 68 | Temp 97.8°F | Ht 63.0 in | Wt 190.8 lb

## 2011-12-25 DIAGNOSIS — R1310 Dysphagia, unspecified: Secondary | ICD-10-CM

## 2011-12-25 DIAGNOSIS — K3189 Other diseases of stomach and duodenum: Secondary | ICD-10-CM

## 2011-12-25 DIAGNOSIS — R1013 Epigastric pain: Secondary | ICD-10-CM | POA: Insufficient documentation

## 2011-12-25 NOTE — Patient Instructions (Signed)
AVOID ICE WATER.  DRINK DASANI AS TOLERATED.  CONTINUE PROTONIX FOREVER.  FOLLOW U IN 6 MOS TO ONE YEAR.

## 2011-12-25 NOTE — Progress Notes (Signed)
Subjective:    Patient ID: [name], female    DOB: 1961/01/25, 51 y.o.   MRN: [id number]  PCP: Legrand Rams   HPI FEELING PRETTY GOOD. ENERGY LEVEL PRETTY GOOD. APPETITE: GOOD. NO HEARTBURN OR INDIGESTION. NO NAUSEA OR VOMITING. CONCERNED SHE HAD A PARASITE. STILL UNABLE TO TOLERATE ICE WATER. NO PROBLEMS SWALLOWING. GAINED 6 LBS SINCE JUN 2013.  Past Medical History  Diagnosis Date  . Reversible ischemic neurological deficit     embolic  . Diabetes mellitus     insulin treated  . Congenital third degree heart block     Guidant VVI pacemaker implanted in 09/1997; 8generator change in 01/2004   . Chronic atrial fibrillation     embolic rind  . Cardiomyopathy     with congestive heart failure   ejection fraction of 30% in 09/2003  . Chronic anticoagulation   . Stroke 1999  . CHF (congestive heart failure)   . Helicobacter pylori gastritis 08/14/2011  . Dysphagia 08/14/2011    FEB 2013 EGD/DIL 16 MM   . GERD (gastroesophageal reflux disease) 08/14/2011  . Chronic kidney disease     creatinine- 1.8 in 09/2006 and 2.0 in 05/2007; 2.05 in 2011, 2.29 in 2012    Past Surgical History  Procedure Date  . Insert / replace / remove pacemaker 09/1997 w/gen. change in 01/2004    Guidant VVI [employer]  . Colonoscopy 04/23/2011    DESCENDING COLON POLYP/ INTERNAL HEMORRHOIDS/ TORTUOUS COLON  . Upper gastrointestinal endoscopy FEB 2013    RING/DIL TO 16 MM, H PYLORI GASTRITIS   Allergies  Allergen Reactions  . Wheat Swelling  . Latex Rash  . Penicillins Rash  . Sulfa Antibiotics Rash    Current Outpatient Prescriptions  Medication Sig Dispense Refill  . ACCU-CHEK AVIVA PLUS test strip       . allopurinol (ZYLOPRIM) 100 MG tablet Take 150 mg by mouth daily.       . benazepril (LOTENSIN) 20 MG tablet Take 10 mg by mouth daily.       . cholecalciferol (VITAMIN D) 1000 UNITS tablet Take 1,000 Units by mouth daily.      Marland Kitchen COLCRYS 0.6 MG tablet Take 0.6 mg by mouth as needed.       .  COREG 12.5 MG tablet TAKE (2) TABLETS BY MOUTH TWICE DAILY.    . cyclobenzaprine (FLEXERIL) 5 MG tablet Take 1 tablet (5 mg total) by mouth 3 (three) times daily as needed for muscle spasms.    . digoxin (LANOXIN) 0.125 MG tablet Take 1 tablet (125 mcg total) by mouth as directed. 5 days/ week M-F    . furosemide (LASIX) 80 MG tablet Take 1 tablet (80 mg total) by mouth 2 (two) times daily.    . insulin aspart (NOVOLOG) 100 UNIT/ML injection Inject 12-20 Units into the skin 3 (three) times daily. Inject 12 units at 8:00am, 16 units at lunch and 20 units at suppertime    . insulin glargine (LANTUS) 100 UNIT/ML injection Inject 30-40 Units into the skin 2 (two) times daily. Inject 40 units in am and 30 units at bedtime    . insulin NPH (HUMULIN N,NOVOLIN N) 100 UNIT/ML injection Inject 34 Units into the skin at bedtime.     . pantoprazole (PROTONIX) 40 MG tablet Take 40 mg by mouth daily.     . QC PEN NEEDLES 31G X 8 MM MISC     . spironolactone (ALDACTONE) 25 MG tablet Take 25 mg by mouth daily.      Marland Kitchen  warfarin (COUMADIN) 5 MG tablet Take 5 mg by mouth as directed. 1/2 everyday except Thursday whole tablet    .           Review of Systems     Objective:   Physical Exam  Vitals reviewed. Constitutional: She is oriented to person, place, and time. She appears well-nourished. No distress.  HENT:  Head: Normocephalic and atraumatic.  Eyes: Pupils are equal, round, and reactive to light. No scleral icterus.  Neck: Normal range of motion. Neck supple.  Cardiovascular: Normal rate, regular rhythm and normal heart sounds.   Pulmonary/Chest: Effort normal and breath sounds normal. No respiratory distress.  Abdominal: Soft. Bowel sounds are normal. She exhibits no distension. There is no tenderness.  Musculoskeletal: She exhibits no edema.  Neurological: She is alert and oriented to person, place, and time.       NO FOCAL DEFICITS           Assessment & Plan:

## 2011-12-25 NOTE — Assessment & Plan Note (Signed)
RESOLVED.  OPV IN 6 MOS.

## 2011-12-25 NOTE — Progress Notes (Signed)
Faxed to PCP

## 2011-12-25 NOTE — Assessment & Plan Note (Signed)
WITH ICE WATER.  AVOID ICE WATER. DRINK DASANI. CONTINUE PROTONIX. OPV IN 6 MOS.

## 2011-12-30 ENCOUNTER — Telehealth: Payer: Self-pay | Admitting: Gastroenterology

## 2011-12-30 MED ORDER — PANTOPRAZOLE SODIUM 40 MG PO TBEC: 40.0000 mg | DELAYED_RELEASE_TABLET | Freq: Every day | ORAL | Status: DC

## 2011-12-30 NOTE — Telephone Encounter (Signed)
Completed.

## 2011-12-30 NOTE — Telephone Encounter (Signed)
Patient called to say she is almost out of her pantoprazole and couldn't remember if she told us that her new pharmacy is [place] and could we call her in a refill ASAP. She is going to call us back at 4pm today to confirm.

## 2012-01-10 ENCOUNTER — Other Ambulatory Visit: Payer: Self-pay | Admitting: Cardiology

## 2012-01-10 NOTE — Telephone Encounter (Signed)
rx sent to pharmacy by e-script Pt has upcoming OV with MD Lovena Le and Lattie Haw Noted pt is coming regularly for PT/INR checks as directed

## 2012-01-14 ENCOUNTER — Other Ambulatory Visit: Payer: Self-pay | Admitting: *Deleted

## 2012-01-14 MED ORDER — WARFARIN SODIUM 5 MG PO TABS: 5.0000 mg | ORAL_TABLET | ORAL | Status: DC

## 2012-01-16 ENCOUNTER — Ambulatory Visit (INDEPENDENT_AMBULATORY_CARE_PROVIDER_SITE_OTHER): Payer: Medicaid Other | Admitting: *Deleted

## 2012-01-16 DIAGNOSIS — I4891 Unspecified atrial fibrillation: Secondary | ICD-10-CM

## 2012-01-16 NOTE — Progress Notes (Signed)
Reminder in epic to follow up in 6 months with SF in E30 °

## 2012-01-30 ENCOUNTER — Other Ambulatory Visit: Payer: Self-pay | Admitting: Obstetrics & Gynecology

## 2012-01-30 ENCOUNTER — Other Ambulatory Visit (HOSPITAL_COMMUNITY)
Admission: RE | Admit: 2012-01-30 | Discharge: 2012-01-30 | Disposition: A | Discharge status: Home / Self Care | Payer: Medicaid Other | Source: Ambulatory Visit | Attending: Obstetrics & Gynecology | Admitting: Obstetrics & Gynecology

## 2012-01-30 DIAGNOSIS — Z01419 Encounter for gynecological examination (general) (routine) without abnormal findings: Secondary | ICD-10-CM | POA: Insufficient documentation

## 2012-02-05 ENCOUNTER — Other Ambulatory Visit: Payer: Self-pay | Admitting: Obstetrics & Gynecology

## 2012-02-05 ENCOUNTER — Encounter: Payer: Self-pay | Admitting: Cardiology

## 2012-02-05 DIAGNOSIS — Z139 Encounter for screening, unspecified: Secondary | ICD-10-CM

## 2012-02-11 ENCOUNTER — Ambulatory Visit (HOSPITAL_COMMUNITY): Payer: Medicaid Other

## 2012-02-13 ENCOUNTER — Ambulatory Visit (INDEPENDENT_AMBULATORY_CARE_PROVIDER_SITE_OTHER): Payer: Medicaid Other | Admitting: *Deleted

## 2012-02-13 ENCOUNTER — Other Ambulatory Visit: Payer: Self-pay | Admitting: *Deleted

## 2012-02-13 DIAGNOSIS — I4891 Unspecified atrial fibrillation: Secondary | ICD-10-CM

## 2012-02-13 MED ORDER — WARFARIN SODIUM 5 MG PO TABS: 5.0000 mg | ORAL_TABLET | ORAL | Status: DC

## 2012-02-14 ENCOUNTER — Ambulatory Visit (HOSPITAL_COMMUNITY)
Admission: RE | Admit: 2012-02-14 | Discharge: 2012-02-14 | Disposition: A | Discharge status: Home / Self Care | Payer: Medicaid Other | Source: Ambulatory Visit | Attending: Obstetrics & Gynecology | Admitting: Obstetrics & Gynecology

## 2012-02-14 DIAGNOSIS — Z1231 Encounter for screening mammogram for malignant neoplasm of breast: Secondary | ICD-10-CM | POA: Insufficient documentation

## 2012-02-14 DIAGNOSIS — Z139 Encounter for screening, unspecified: Secondary | ICD-10-CM

## 2012-02-17 ENCOUNTER — Encounter (HOSPITAL_COMMUNITY): Payer: Self-pay

## 2012-02-17 DIAGNOSIS — I442 Atrioventricular block, complete: Secondary | ICD-10-CM | POA: Insufficient documentation

## 2012-02-17 DIAGNOSIS — Z7901 Long term (current) use of anticoagulants: Secondary | ICD-10-CM | POA: Insufficient documentation

## 2012-02-17 DIAGNOSIS — I4891 Unspecified atrial fibrillation: Secondary | ICD-10-CM | POA: Insufficient documentation

## 2012-02-17 DIAGNOSIS — J40 Bronchitis, not specified as acute or chronic: Secondary | ICD-10-CM | POA: Insufficient documentation

## 2012-02-17 DIAGNOSIS — K2940 Chronic atrophic gastritis without bleeding: Secondary | ICD-10-CM | POA: Insufficient documentation

## 2012-02-17 DIAGNOSIS — Z8719 Personal history of other diseases of the digestive system: Secondary | ICD-10-CM | POA: Insufficient documentation

## 2012-02-17 DIAGNOSIS — I428 Other cardiomyopathies: Secondary | ICD-10-CM | POA: Insufficient documentation

## 2012-02-17 DIAGNOSIS — Z8673 Personal history of transient ischemic attack (TIA), and cerebral infarction without residual deficits: Secondary | ICD-10-CM | POA: Insufficient documentation

## 2012-02-17 DIAGNOSIS — E119 Type 2 diabetes mellitus without complications: Secondary | ICD-10-CM | POA: Insufficient documentation

## 2012-02-17 DIAGNOSIS — N189 Chronic kidney disease, unspecified: Secondary | ICD-10-CM | POA: Insufficient documentation

## 2012-02-17 DIAGNOSIS — I509 Heart failure, unspecified: Secondary | ICD-10-CM | POA: Insufficient documentation

## 2012-02-17 DIAGNOSIS — Z794 Long term (current) use of insulin: Secondary | ICD-10-CM | POA: Insufficient documentation

## 2012-02-17 NOTE — ED Notes (Signed)
Cough and congestion, also headache for couple of days

## 2012-02-18 ENCOUNTER — Emergency Department (HOSPITAL_COMMUNITY)
Admission: EM | Admit: 2012-02-18 | Discharge: 2012-02-18 | Disposition: A | Discharge status: Home / Self Care | Payer: MEDICAID | Attending: Emergency Medicine | Admitting: Emergency Medicine

## 2012-02-18 DIAGNOSIS — J40 Bronchitis, not specified as acute or chronic: Secondary | ICD-10-CM

## 2012-02-18 MED ORDER — HYDROCOD POLST-CHLORPHEN POLST 10-8 MG/5ML PO LQCR
5.0000 mL | Freq: Once | ORAL | Status: AC
  Administered 2012-02-18: 5 mL via ORAL
  Filled 2012-02-18: qty 5

## 2012-02-18 MED ORDER — HYDROCOD POLST-CHLORPHEN POLST 10-8 MG/5ML PO LQCR: 5.0000 mL | Freq: Two times a day (BID) | ORAL | Status: DC | PRN

## 2012-02-18 MED ORDER — ALBUTEROL SULFATE (5 MG/ML) 0.5% IN NEBU
2.5000 mg | INHALATION_SOLUTION | Freq: Once | RESPIRATORY_TRACT | Status: AC
  Administered 2012-02-18: 2.5 mg via RESPIRATORY_TRACT
  Filled 2012-02-18: qty 0.5

## 2012-02-18 MED ORDER — IPRATROPIUM BROMIDE 0.02 % IN SOLN
0.5000 mg | Freq: Once | RESPIRATORY_TRACT | Status: AC
  Administered 2012-02-18: 0.5 mg via RESPIRATORY_TRACT
  Filled 2012-02-18: qty 2.5

## 2012-02-18 MED ORDER — ALBUTEROL SULFATE HFA 108 (90 BASE) MCG/ACT IN AERS
2.0000 | INHALATION_SPRAY | RESPIRATORY_TRACT | Status: DC | PRN
  Administered 2012-02-18: 2 via RESPIRATORY_TRACT
  Filled 2012-02-18: qty 6.7

## 2012-02-18 NOTE — ED Notes (Signed)
Discharge instructions given and reviewed with patient and significant other.  Prescription given for Tussionex; effects and use explained. Patient verbalized understanding to take medication as directed, increase fluids and follow up with PMD as needed.  Patient discharged via wheelchair.

## 2012-02-18 NOTE — ED Provider Notes (Signed)
History     CSN: [id number]  Arrival date & time 02/17/12  2331   First MD Initiated Contact with Patient 02/18/12 0142      Chief Complaint  Patient presents with  . Cough    (Consider location/radiation/quality/duration/timing/severity/associated sxs/prior treatment) HPI This is a 51 year old female with multiple medical problems. She is here with a one-day history of cough and chest congestion. She has also had headache for several days. Her symptoms are mild to moderate. She is not aware of wheezing but was administered an albuterol and Atrovent neb treatment on arrival per protocol; she thinks she got some improvement as a result of that. She denies fever but has had chills. She denies body aches. Her cough has been nonproductive. She has had recent sick contacts.  Past Medical History  Diagnosis Date  . Reversible ischemic neurological deficit     embolic  . Diabetes mellitus     insulin treated  . Congenital third degree heart block     Guidant VVI pacemaker implanted in 09/1997; 8generator change in 01/2004   . Chronic atrial fibrillation     embolic rind  . Cardiomyopathy     with congestive heart failure   ejection fraction of 30% in 09/2003  . Chronic anticoagulation   . Stroke 1999  . CHF (congestive heart failure)   . Helicobacter pylori gastritis 08/14/2011  . Dysphagia 08/14/2011    FEB 2013 EGD/DIL 16 MM   . GERD (gastroesophageal reflux disease) 08/14/2011  . Chronic kidney disease     creatinine- 1.8 in 09/2006 and 2.0 in 05/2007; 2.05 in 2011, 2.29 in 2012    Past Surgical History  Procedure Date  . Insert / replace / remove pacemaker 09/1997 w/gen. change in 01/2004    Guidant VVI [employer]  . Colonoscopy 04/23/2011    DESCENDING COLON POLYP/ INTERNAL HEMORRHOIDS/ TORTUOUS COLON  . Upper gastrointestinal endoscopy FEB 2013    RING/DIL TO 16 MM, H PYLORI GASTRITIS    Family History  Problem Relation Age of Onset  . Adopted: Yes  . Colon cancer  Neg Hx     History  Substance Use Topics  . Smoking status: Never Smoker   . Smokeless tobacco: Never Used  . Alcohol Use: No    OB History    Grav Para Term Preterm Abortions TAB SAB Ect Mult Living                  Review of Systems  All other systems reviewed and are negative.    Allergies  Wheat; Latex; Penicillins; and Sulfa antibiotics  Home Medications   Current Outpatient Rx  Name  Route  Sig  Dispense  Refill  . ACCU-CHEK AVIVA PLUS VI STRP               . ALLOPURINOL 100 MG PO TABS   Oral   Take 150 mg by mouth daily.          Marland Kitchen BENAZEPRIL HCL 20 MG PO TABS   Oral   Take 10 mg by mouth daily.          Marland Kitchen BIS SUBCIT-METRONID-TETRACYC 140-125-125 MG PO CAPS   Oral   Take 3 capsules by mouth 4 (four) times daily -  before meals and at bedtime. FOR 10 DAYS   120 capsule   0   . VITAMIN D 1000 UNITS PO TABS   Oral   Take 1,000 Units by mouth daily.         Marland Kitchen  COLCRYS 0.6 MG PO TABS   Oral   Take 0.6 mg by mouth as needed.          . COREG 12.5 MG PO TABS      TAKE (2) TABLETS BY MOUTH TWICE DAILY.   60 each   6     Dispense as written.   Marland Kitchen COUMADIN 5 MG PO TABS      TAKE 1 TABLET DAILY AS DIRECTED.   30 tablet   3     Dispense as written.   . CYCLOBENZAPRINE HCL 5 MG PO TABS   Oral   Take 1 tablet (5 mg total) by mouth 3 (three) times daily as needed for muscle spasms.   15 tablet   0   . DIGOXIN 0.125 MG PO TABS   Oral   Take 1 tablet (125 mcg total) by mouth as directed. 5 days/ week M-F   30 tablet   11     Brand Medically necessary   . FUROSEMIDE 80 MG PO TABS   Oral   Take 1 tablet (80 mg total) by mouth 2 (two) times daily.   60 tablet   12   . INSULIN ASPART 100 UNIT/ML [place] SOLN   Subcutaneous   Inject 12-20 Units into the skin 3 (three) times daily. Inject 12 units at 8:00am, 16 units at lunch and 20 units at suppertime         . INSULIN GLARGINE 100 UNIT/ML [place] SOLN   Subcutaneous   Inject 30-40  Units into the skin 2 (two) times daily. Inject 40 units in am and 30 units at bedtime         . INSULIN ISOPHANE HUMAN 100 UNIT/ML [place] SUSP   Subcutaneous   Inject 34 Units into the skin at bedtime.          Marland Kitchen PANTOPRAZOLE SODIUM 40 MG PO TBEC   Oral   Take 1 tablet (40 mg total) by mouth daily.   30 tablet   5   . QC PEN NEEDLES 31G X 8 MM MISC               . SPIRONOLACTONE 25 MG PO TABS   Oral   Take 25 mg by mouth daily.           . WARFARIN SODIUM 5 MG PO TABS   Oral   Take 1 tablet (5 mg total) by mouth as directed. 1/2 everyday except Thursday whole tablet   45 tablet   3     Brand name necessary     BP 110/58  Pulse 62  Temp 99 F (37.2 C)  Resp 16  Ht 5\' 6"  (1.676 m)  Wt 158 lb (71.668 kg)  BMI 25.50 kg/m2  SpO2 97%  Physical Exam General: Well-developed, well-nourished female in no acute distress; appearance consistent with age of record HENT: normocephalic, atraumatic Eyes: pupils equal round and reactive to light; extraocular muscles intact Neck: supple Heart: regular rate and rhythm; systolic murmur heard at the apex Lungs: clear to auscultation bilaterally; dry cough Abdomen: soft; nondistended; nontender; bowel sounds present Extremities: No deformity; full range of motion; pulses normal; no edema Neurologic: Awake, alert and oriented; motor function intact in all extremities and symmetric; no facial droop Skin: Warm and dry Psychiatric: Normal mood and affect    ED Course  Procedures (including critical care time)     MDM          Lakeyn Dokken L Honesty Menta,  MD 02/18/12 ID:[id number]

## 2012-02-18 NOTE — ED Notes (Signed)
A&O; skin w/d. Respirations even and unlabored.  Patient c/o dry, non-productive cough that started yesterday.  Patient states she is coughing so hard that she now has a headache.

## 2012-03-02 ENCOUNTER — Ambulatory Visit (INDEPENDENT_AMBULATORY_CARE_PROVIDER_SITE_OTHER): Payer: Medicaid Other | Admitting: Internal Medicine

## 2012-03-02 ENCOUNTER — Encounter: Payer: Self-pay | Admitting: Internal Medicine

## 2012-03-02 VITALS — BP 108/69 | HR 70 | Ht 66.0 in | Wt 188.0 lb

## 2012-03-02 DIAGNOSIS — Q246 Congenital heart block: Secondary | ICD-10-CM

## 2012-03-02 DIAGNOSIS — I428 Other cardiomyopathies: Secondary | ICD-10-CM

## 2012-03-02 DIAGNOSIS — Z950 Presence of cardiac pacemaker: Secondary | ICD-10-CM

## 2012-03-02 LAB — PACEMAKER DEVICE OBSERVATION
BMOD-0003RV: 30
BMOD-0004RV: 5
BRDY-0002RV: 60 {beats}/min
RV LEAD THRESHOLD: 1 V
VENTRICULAR PACING PM: 100

## 2012-03-02 NOTE — Assessment & Plan Note (Signed)
Her PPM has evidence of noise on her ventricular lead with minimal inhibition of this noise. I have reprogrammed the device sensitivity today to 5 mv from 2.5. I will see her back in 3 months. I have warned her of the symptoms she might experience if she had worsening noise on her lead.

## 2012-03-02 NOTE — Patient Instructions (Addendum)
Your physician recommends that you schedule a follow-up appointment in 3 MONTHS. 

## 2012-03-02 NOTE — Assessment & Plan Note (Signed)
Despite her LV dysfunction, she remains with class 1 symptoms. She will continue her current meds. I have discussed the signs that her CHF might be worsening with the patient and her husband.

## 2012-03-02 NOTE — Progress Notes (Signed)
HPI [name] returns today for followup. She is a pleasant middle aged woman with congenital CHB, atrial fibrillation s/p PPM. She has class 1 CHF and a non-ischemic CM, EF 30%. In the interim she has done well with no syncope, chest pain or sob. Her symptoms remain class 1.  Allergies  Allergen Reactions  . Wheat Swelling  . Latex Rash  . Penicillins Rash  . Sulfa Antibiotics Rash     Current Outpatient Prescriptions  Medication Sig Dispense Refill  . allopurinol (ZYLOPRIM) 100 MG tablet Take 150 mg by mouth daily.       . benazepril (LOTENSIN) 20 MG tablet Take 10 mg by mouth daily.       . chlorpheniramine-HYDROcodone (TUSSIONEX PENNKINETIC ER) 10-8 MG/5ML LQCR Take 5 mLs by mouth every 12 (twelve) hours as needed (for cough).  115 mL  0  . cholecalciferol (VITAMIN D) 1000 UNITS tablet Take 1,000 Units by mouth daily.      Marland Kitchen COLCRYS 0.6 MG tablet Take 0.6 mg by mouth as needed.       . COREG 12.5 MG tablet TAKE (2) TABLETS BY MOUTH TWICE DAILY.  60 each  6  . COUMADIN 5 MG tablet TAKE 1 TABLET DAILY AS DIRECTED.  30 tablet  3  . cyclobenzaprine (FLEXERIL) 5 MG tablet Take 1 tablet (5 mg total) by mouth 3 (three) times daily as needed for muscle spasms.  15 tablet  0  . digoxin (LANOXIN) 0.125 MG tablet Take 1 tablet (125 mcg total) by mouth as directed. 5 days/ week M-F  30 tablet  11  . furosemide (LASIX) 80 MG tablet Take 1 tablet (80 mg total) by mouth 2 (two) times daily.  60 tablet  12  . insulin aspart (NOVOLOG) 100 UNIT/ML injection Inject 12-20 Units into the skin 3 (three) times daily. Inject 12 units at 8:00am, 16 units at lunch and 20 units at suppertime      . insulin glargine (LANTUS) 100 UNIT/ML injection Inject 30-40 Units into the skin 2 (two) times daily. Inject 40 units in am and 30 units at bedtime      . insulin NPH (HUMULIN N,NOVOLIN N) 100 UNIT/ML injection Inject 34 Units into the skin at bedtime.       . pantoprazole (PROTONIX) 40 MG tablet Take 1 tablet (40  mg total) by mouth daily.  30 tablet  5  . spironolactone (ALDACTONE) 25 MG tablet Take 25 mg by mouth daily.        Marland Kitchen warfarin (COUMADIN) 5 MG tablet Take 1 tablet (5 mg total) by mouth as directed. 1/2 everyday except Thursday whole tablet  45 tablet  3  . ACCU-CHEK AVIVA PLUS test strip       . QC PEN NEEDLES 31G X 8 MM MISC          Past Medical History  Diagnosis Date  . Reversible ischemic neurological deficit     embolic  . Diabetes mellitus     insulin treated  . Congenital third degree heart block     Guidant VVI pacemaker implanted in 09/1997; 8generator change in 01/2004   . Chronic atrial fibrillation     embolic rind  . Cardiomyopathy     with congestive heart failure   ejection fraction of 30% in 09/2003  . Chronic anticoagulation   . Stroke 1999  . CHF (congestive heart failure)   . Helicobacter pylori gastritis 08/14/2011  . Dysphagia 08/14/2011    FEB 2013 EGD/DIL  62 MM   . GERD (gastroesophageal reflux disease) 08/14/2011  . Chronic kidney disease     creatinine- 1.8 in 09/2006 and 2.0 in 05/2007; 2.05 in 2011, 2.29 in 2012    ROS:   All systems reviewed and negative except as noted in the HPI.   Past Surgical History  Procedure Date  . Insert / replace / remove pacemaker 09/1997 w/gen. change in 01/2004    Guidant VVI [employer]  . Colonoscopy 04/23/2011    DESCENDING COLON POLYP/ INTERNAL HEMORRHOIDS/ TORTUOUS COLON  . Upper gastrointestinal endoscopy FEB 2013    RING/DIL TO 16 MM, H PYLORI GASTRITIS     Family History  Problem Relation Age of Onset  . Adopted: Yes  . Colon cancer Neg Hx      History   Social History  . Marital Status: Married    Spouse Name: N/A    Number of Children: 4  . Years of Education: N/A   Occupational History  .     Social History Main Topics  . Smoking status: Never Smoker   . Smokeless tobacco: Never Used  . Alcohol Use: No  . Drug Use: No  . Sexually Active: Not on file   Other Topics Concern  .  Not on file   Social History Narrative  . No narrative on file     BP 108/69  Pulse 70  Ht 5\' 6"  (1.676 m)  Wt 188 lb (85.276 kg)  BMI 30.34 kg/m2  Physical Exam:  Well appearing middle aged woman, NAD HEENT: Unremarkable Neck:  No JVD, no thyromegally Lungs:  Clear with no wheezes, rales, or rhonchi HEART:  Regular rate rhythm, no murmurs, no rubs, no clicks Abd:  soft, positive bowel sounds, no organomegally, no rebound, no guarding Ext:  2 plus pulses, no edema, no cyanosis, no clubbing Skin:  No rashes no nodules Neuro:  CN II through XII intact, motor grossly intact  DEVICE  Normal device function.  See PaceArt for details. Noise on ventricular lead is noted.  Assess/Plan:

## 2012-03-04 ENCOUNTER — Telehealth: Payer: Self-pay | Admitting: *Deleted

## 2012-03-04 NOTE — Telephone Encounter (Signed)
Contacted patient's pharmacy and brand name Coreg is no longer being manufactured.  She is currently on 12.5 mg bid.  May we replace this with the Coreg CR 20 mg daily dose, which they will be continuing to supply, at this time.

## 2012-03-08 NOTE — Telephone Encounter (Signed)
Agree with plan, but use dose of 40 mg per day of Coreg-CR.

## 2012-03-09 ENCOUNTER — Other Ambulatory Visit: Payer: Self-pay | Admitting: *Deleted

## 2012-03-09 ENCOUNTER — Encounter: Payer: Self-pay | Admitting: Internal Medicine

## 2012-03-09 MED ORDER — CARVEDILOL PHOSPHATE ER 40 MG PO CP24: 40.0000 mg | ORAL_CAPSULE | Freq: Every day | ORAL | Status: DC

## 2012-03-09 NOTE — Telephone Encounter (Signed)
Written script prepared for patient and message left for a return call regarding change in medication.

## 2012-03-09 NOTE — Telephone Encounter (Signed)
Patient made aware of new script and will pick up this morning.

## 2012-03-12 ENCOUNTER — Ambulatory Visit (INDEPENDENT_AMBULATORY_CARE_PROVIDER_SITE_OTHER): Payer: Medicaid Other | Admitting: *Deleted

## 2012-03-12 ENCOUNTER — Encounter (HOSPITAL_COMMUNITY): Payer: Self-pay | Admitting: *Deleted

## 2012-03-12 ENCOUNTER — Inpatient Hospital Stay (HOSPITAL_COMMUNITY)
Admission: EM | Admit: 2012-03-12 | Discharge: 2012-03-14 | DRG: 149 | Disposition: A | Discharge status: Home / Self Care | Payer: Medicaid Other | Attending: Internal Medicine | Admitting: Internal Medicine

## 2012-03-12 ENCOUNTER — Emergency Department (HOSPITAL_COMMUNITY): Payer: Medicaid Other

## 2012-03-12 ENCOUNTER — Ambulatory Visit: Payer: Medicaid Other | Admitting: Cardiology

## 2012-03-12 DIAGNOSIS — R1310 Dysphagia, unspecified: Secondary | ICD-10-CM | POA: Diagnosis present

## 2012-03-12 DIAGNOSIS — I4891 Unspecified atrial fibrillation: Secondary | ICD-10-CM | POA: Diagnosis present

## 2012-03-12 DIAGNOSIS — R42 Dizziness and giddiness: Principal | ICD-10-CM | POA: Diagnosis present

## 2012-03-12 DIAGNOSIS — I428 Other cardiomyopathies: Secondary | ICD-10-CM | POA: Diagnosis present

## 2012-03-12 DIAGNOSIS — Z794 Long term (current) use of insulin: Secondary | ICD-10-CM

## 2012-03-12 DIAGNOSIS — E1165 Type 2 diabetes mellitus with hyperglycemia: Secondary | ICD-10-CM | POA: Diagnosis present

## 2012-03-12 DIAGNOSIS — I509 Heart failure, unspecified: Secondary | ICD-10-CM | POA: Diagnosis present

## 2012-03-12 DIAGNOSIS — E119 Type 2 diabetes mellitus without complications: Secondary | ICD-10-CM | POA: Diagnosis present

## 2012-03-12 DIAGNOSIS — Z7901 Long term (current) use of anticoagulants: Secondary | ICD-10-CM

## 2012-03-12 DIAGNOSIS — K219 Gastro-esophageal reflux disease without esophagitis: Secondary | ICD-10-CM | POA: Diagnosis present

## 2012-03-12 DIAGNOSIS — N184 Chronic kidney disease, stage 4 (severe): Secondary | ICD-10-CM | POA: Diagnosis present

## 2012-03-12 DIAGNOSIS — Z950 Presence of cardiac pacemaker: Secondary | ICD-10-CM

## 2012-03-12 DIAGNOSIS — I1310 Hypertensive heart and chronic kidney disease without heart failure, with stage 1 through stage 4 chronic kidney disease, or unspecified chronic kidney disease: Secondary | ICD-10-CM | POA: Diagnosis present

## 2012-03-12 DIAGNOSIS — Q246 Congenital heart block: Secondary | ICD-10-CM

## 2012-03-12 DIAGNOSIS — Z8673 Personal history of transient ischemic attack (TIA), and cerebral infarction without residual deficits: Secondary | ICD-10-CM

## 2012-03-12 DIAGNOSIS — Z79899 Other long term (current) drug therapy: Secondary | ICD-10-CM

## 2012-03-12 DIAGNOSIS — I129 Hypertensive chronic kidney disease with stage 1 through stage 4 chronic kidney disease, or unspecified chronic kidney disease: Secondary | ICD-10-CM | POA: Diagnosis present

## 2012-03-12 LAB — COMPREHENSIVE METABOLIC PANEL
ALT: 15 U/L (ref 0–35)
Alkaline Phosphatase: 126 U/L — ABNORMAL HIGH (ref 39–117)
CO2: 27 mEq/L (ref 19–32)
Chloride: 97 mEq/L (ref 96–112)
GFR calc Af Amer: 32 mL/min — ABNORMAL LOW (ref >90)
GFR calc non Af Amer: 28 mL/min — ABNORMAL LOW (ref >90)
Glucose, Bld: 151 mg/dL — ABNORMAL HIGH (ref 70–99)
Potassium: 3.9 mEq/L (ref 3.5–5.1)
Sodium: 133 mEq/L — ABNORMAL LOW (ref 135–145)
Total Bilirubin: 0.8 mg/dL (ref 0.3–1.2)

## 2012-03-12 LAB — CBC WITH DIFFERENTIAL/PLATELET
Lymphocytes Relative: 29 % (ref 12–46)
Lymphs Abs: 1.9 10*3/uL (ref 0.7–4.0)
Neutrophils Relative %: 65 % (ref 43–77)
Platelets: 173 10*3/uL (ref 150–400)
RBC: 4.88 MIL/uL (ref 3.87–5.11)
WBC: 6.7 10*3/uL (ref 4.0–10.5)

## 2012-03-12 NOTE — H&P (Signed)
[facility] History and Physical  JALON ISHERWOOD [id number] DOB: 1960/12/19 DOA: 03/12/2012  Referring physician: EDP Delo PCP: [name], MD  Specialists: Cardiology, [place]  Chief Complaint: Dizziness  HPI: [name] is a 52 y.o. 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 She presents to emergency department this evening with persistent dizziness- the symptoms started about one week ago and were intermittent and at this evening she had multiple episodes of the dizziness that did not go away so she decided to come in for evaluation. She has a difficult time fully describing her symptoms but states that it is more of a "head fullness and pressure"- it is not positional, no problems with gait, no nausea, no chest pain, no increased edema, no tinnitus, no recent medication changes, no upper respiratory or congestion symptoms, no recent trauma, no syncope or near-syncope, no focal neurological deficits such as weakness or sensory loss, no vision changes and overall she reports feeling very well. She has a very positive and upbeat attitude and appears to manage her multiple chronic diseases very well. She recently was seen for an INR check and it was slightly supratherapeutic at 3.3.   Dr. Stark Jock in the emergency department spoke with the on-call cardiology team and  he recommended that the patient be admitted observation and so that her pacemaker could be interrogated and evaluated- she has a Guidant pacemaker that is old and likely needs to be replaced per the patient. In the ED her laboratory workup is essentially stable from her baseline, her EKG shows a ventricular paced rhythm with no  abnormalities.   Review of Systems:  Review of Systems  Constitutional: Negative.   Respiratory: Negative.   Cardiovascular: Negative.   Gastrointestinal: Positive for heartburn.  Genitourinary: Negative.   Musculoskeletal: Negative.   Skin: Negative.   Neurological: Positive for dizziness. Negative for tingling, tremors, sensory change, speech change, focal weakness, seizures and loss of consciousness.  Endo/Heme/Allergies: Bruises/bleeds easily.  Psychiatric/Behavioral: Negative.  Negative for depression.  Otherwise per HPI.   Past Medical History  Diagnosis Date  . Reversible ischemic neurological deficit     embolic  . Diabetes mellitus     insulin treated  . Congenital third degree heart block     Guidant VVI pacemaker implanted in 09/1997; 8generator change in 01/2004   . Chronic atrial fibrillation     embolic rind  . Cardiomyopathy     with congestive heart failure   ejection fraction of 30% in 09/2003  . Chronic anticoagulation   . Stroke 1999  . CHF (congestive heart failure)   . Helicobacter pylori gastritis 08/14/2011  . Dysphagia 08/14/2011    FEB 2013 EGD/DIL 16 MM   . GERD (gastroesophageal reflux disease) 08/14/2011  . Chronic kidney disease     creatinine- 1.8 in 09/2006 and 2.0 in 05/2007; 2.05 in 2011, 2.29 in 2012  . Diabetes mellitus, type 2 02/05/2012    Glucose-257 in 01/2012   . Dysphagia 08/14/2011    FEB 2013 EGD/DIL 16 MM; History of gastroesophageal reflux disease; + H. Pylori gastritis    Past Surgical History  Procedure Date  . Insert / replace / remove pacemaker 09/1997 w/gen. change in 01/2004    Guidant VVI [employer]  . Colonoscopy 04/23/2011  DESCENDING COLON POLYP/ INTERNAL HEMORRHOIDS/ TORTUOUS COLON  . Upper gastrointestinal endoscopy FEB 2013    RING/DIL TO 16 MM, H PYLORI GASTRITIS   Social History:  reports that she has never smoked. She has never used smokeless tobacco. She reports that she does not drink alcohol or use  illicit drugs. Lives at home independently she has a large supportive family at her bedside in the emergency department  Allergies  Allergen Reactions  . Wheat Swelling  . Latex Rash  . Penicillins Rash  . Sulfa Antibiotics Rash    Family History  Problem Relation Age of Onset  . Adopted: Yes  . Colon cancer Neg Hx    Prior to Admission medications   Medication Sig Start Date End Date Taking? Authorizing Provider  albuterol (VENTOLIN HFA) 108 (90 BASE) MCG/ACT inhaler Inhale 2 puffs into the lungs every 6 (six) hours as needed. For shortness of breath   Yes Historical Provider, MD  allopurinol (ZYLOPRIM) 100 MG tablet Take 150 mg by mouth every morning.    Yes Historical Provider, MD  benazepril (LOTENSIN) 20 MG tablet Take 10 mg by mouth every morning.    Yes Historical Provider, MD  carvedilol (COREG) 25 MG tablet Take 25 mg by mouth 2 (two) times daily.   Yes Historical Provider, MD  cholecalciferol (VITAMIN D) 1000 UNITS tablet Take 1,000 Units by mouth daily.   Yes Historical Provider, MD  digoxin (LANOXIN) 0.125 MG tablet Take 125 mcg by mouth every morning. 5 days/ week M-F 11/04/11  Yes Yehuda Savannah, MD  furosemide (LASIX) 80 MG tablet Take 1 tablet (80 mg total) by mouth 2 (two) times daily. 10/01/11 09/30/12 Yes Yehuda Savannah, MD  insulin aspart (NOVOLOG) 100 UNIT/ML injection Inject 12-20 Units into the skin 3 (three) times daily. Inject 12 units at 8:00am, 16 units at lunch and 20 units at suppertime   Yes Historical Provider, MD  insulin glargine (LANTUS) 100 UNIT/ML injection Inject 30-40 Units into the skin 2 (two) times daily. Inject 40 units in am and 30 units at bedtime   Yes Historical Provider, MD  insulin NPH (HUMULIN N,NOVOLIN N) 100 UNIT/ML injection Inject 34 Units into the skin at bedtime.    Yes Historical Provider, MD  pantoprazole (PROTONIX) 40 MG tablet Take 40 mg by mouth every morning. 12/30/11  Yes Orvil Feil, NP  spironolactone (ALDACTONE) 25 MG tablet  Take 25 mg by mouth every morning.    Yes Historical Provider, MD  warfarin (COUMADIN) 5 MG tablet Take 2.5-5 mg by mouth every evening. Take one 1/2 tablet (2.5mg ) everyday except Monday, take one whole tablet (5mg ) 02/13/12  Yes Lendon Colonel, NP  carvedilol (COREG CR) 40 MG 24 hr capsule Take 1 capsule (40 mg total) by mouth daily. 03/09/12   Yehuda Savannah, MD  COLCRYS 0.6 MG tablet Take 0.6 mg by mouth daily as needed. For GOUT FLARE 06/27/11   Historical Provider, MD   Physical Exam: Filed Vitals:   03/12/12 2020  BP: 110/78  Pulse: 60  Temp: 97.7 F (36.5 C)  TempSrc: Oral  Resp: 18  Height: 5\' 6"  (1.676 m)  Weight: 83.008 kg (183 lb)  SpO2: 100%   General appearance: NAD, conversant, pleasant  Eyes: anicteric sclerae, moist conjunctivae; no lid-lag; PERRLA HENT: Atraumatic; oropharynx clear with moist mucous membranes and no mucosal ulcerations; normal hard and soft palate Neck: Trachea midline; FROM, supple, no thyromegaly or lymphadenopathy Lungs: CTA, with normal respiratory effort and no  intercostal retractions CV: RRR, no MRGs -no bruits Abdomen: Soft, non-tender; no masses or HSM Extremities: Trace peripheral edema  Skin: Normal temperature, turgor and texture; no rash, ulcers or subcutaneous nodules Psych: Appropriate affect, alert and oriented to person, place and time Neuro: No focal extremity weakness, strength 5/5, no sensory loss, no nystagmus, Dizziness not inducible with DHM, no pronator drift. Gait normal.  Labs on Admission:  Basic Metabolic Panel:  Lab 99991111 2103  NA 133*  K 3.9  CL 97  CO2 27  GLUCOSE 151*  BUN 41*  CREATININE 1.97*  CALCIUM 9.7  MG --  PHOS --   Liver Function Tests:  Lab 03/12/12 2103  AST 20  ALT 15  ALKPHOS 126*  BILITOT 0.8  PROT 8.3  ALBUMIN 4.1   CBC:  Lab 03/12/12 2103  WBC 6.7  NEUTROABS 4.3  HGB 12.9  HCT 39.8  MCV 81.6  PLT 173   Cardiac Enzymes:  Lab 03/12/12 2103  CKTOTAL --  CKMB --    CKMBINDEX --  TROPONINI <0.30    BNP (last 3 results)  Basename 03/12/12 2103 04/15/11 1255  PROBNP 4526.0* 4309.00*   CBG: No results found for this basename: GLUCAP:5 in the last 168 hours  Radiological Exams on Admission: Dg Chest Port 1 View  03/12/2012  *RADIOLOGY REPORT*  Clinical Data: Dizziness  PORTABLE CHEST - 1 VIEW  Comparison: 04/29/2011  Findings: Left subclavian pacemaker stable position.  Moderate cardiomegaly persists.  Atheromatous aorta.  Lungs clear.  No effusion.  Spurring in the mid thoracic spine.  IMPRESSION:  1.  Stable cardiomegaly.  No acute disease.   Original Report Authenticated By: D. Wallace Going, MD     EKG: Independently reviewed. Ventricular paced rhythm no abnormalities.  Assessment/Plan Principal Problem:  *Dizziness Active Problems:  AODM  CARDIOMYOPATHY  ATRIAL FIBRILLATION, CHRONIC  Chronic kidney disease, stage IV (severe)  Cardiac pacemaker in situ  Chronic anticoagulation  Congenital third degree heart block  History of CVA (cerebrovascular accident)  52 yo with multiple cardiovascular issues as described in HPI. Her complaint of dizziness is nonspecific but given her history she needs observation and cardiology evaluation to likely include pacemaker interrogation. She is also at high risk for stroke, and even though she is on anticoagulation it is not 100% protective from embolic stroke, her INR is slightly elevated.Symptoms not consistent with vertigo.  Plan:  Admitted for observation on telemetry overnight  Cardiology Consult inpatient, pacer interrogation and recommendations  Will check Non constrasted CT Head, if symptoms persist would check MRI and initiate TIA work-up.  Check carotid Dopplers  Resume all home meds as prescribed.   Time spent: 50 minutes  [facility] Pager [phone]  If 7PM-7AM, please contact night-coverage [email or web address] Password [facility] 03/12/2012, 11:48 PM

## 2012-03-12 NOTE — ED Provider Notes (Signed)
History   Scribed for Veryl Speak, MD, the patient was seen in room APA16A/APA16A . This chart was scribed by Denice Bors.   CSN: [id number]  Arrival date & time 03/12/12  2000   First MD Initiated Contact with Patient 03/12/12 2040      Chief Complaint  Patient presents with  . Dizziness    (Consider location/radiation/quality/duration/timing/severity/associated sxs/prior treatment) HPI [name] is a 52 y.o. female who presents to the Emergency Department complaining of waxing and waning lightheadedness for the last week and a half. Pt reports symptoms worsen with motion, such as being in a car and walking. Pt denies shortness of breath, chest pain, and fever. Pt denies hx of this in the past. Pt reports hx of diabetes and pacemaker placement for bradycardia in 1999. Pt states cardiologist is Dr. Lovena Le. Pt states last CBG at home was 140.   Past Medical History  Diagnosis Date  . Reversible ischemic neurological deficit     embolic  . Diabetes mellitus     insulin treated  . Congenital third degree heart block     Guidant VVI pacemaker implanted in 09/1997; 8generator change in 01/2004   . Chronic atrial fibrillation     embolic rind  . Cardiomyopathy     with congestive heart failure   ejection fraction of 30% in 09/2003  . Chronic anticoagulation   . Stroke 1999  . CHF (congestive heart failure)   . Helicobacter pylori gastritis 08/14/2011  . Dysphagia 08/14/2011    FEB 2013 EGD/DIL 16 MM   . GERD (gastroesophageal reflux disease) 08/14/2011  . Chronic kidney disease     creatinine- 1.8 in 09/2006 and 2.0 in 05/2007; 2.05 in 2011, 2.29 in 2012    Past Surgical History  Procedure Date  . Insert / replace / remove pacemaker 09/1997 w/gen. change in 01/2004    Guidant VVI [employer]  . Colonoscopy 04/23/2011    DESCENDING COLON POLYP/ INTERNAL HEMORRHOIDS/ TORTUOUS COLON  . Upper gastrointestinal endoscopy FEB 2013    RING/DIL TO 16 MM, H PYLORI  GASTRITIS    Family History  Problem Relation Age of Onset  . Adopted: Yes  . Colon cancer Neg Hx     History  Substance Use Topics  . Smoking status: Never Smoker   . Smokeless tobacco: Never Used  . Alcohol Use: No    OB History    Grav Para Term Preterm Abortions TAB SAB Ect Mult Living                  Review of Systems  Constitutional: Negative.   HENT: Negative.   Respiratory: Negative.  Negative for cough and shortness of breath.   Cardiovascular: Negative.  Negative for chest pain.  Gastrointestinal: Negative.   Musculoskeletal: Negative.   Skin: Negative.   Neurological: Positive for light-headedness.  Hematological: Negative.   Psychiatric/Behavioral: Negative.   All other systems reviewed and are negative.    Allergies  Wheat; Latex; Penicillins; and Sulfa antibiotics  Home Medications   Current Outpatient Rx  Name  Route  Sig  Dispense  Refill  . ACCU-CHEK AVIVA PLUS VI STRP               . ALLOPURINOL 100 MG PO TABS   Oral   Take 150 mg by mouth daily.          Marland Kitchen BENAZEPRIL HCL 20 MG PO TABS   Oral   Take 10 mg by mouth daily.          Marland Kitchen  CARVEDILOL PHOSPHATE ER 40 MG PO CP24   Oral   Take 1 capsule (40 mg total) by mouth daily.   30 capsule   11   . HYDROCOD POLST-CPM POLST ER 10-8 MG/5ML PO LQCR   Oral   Take 5 mLs by mouth every 12 (twelve) hours as needed (for cough).   115 mL   0   . VITAMIN D 1000 UNITS PO TABS   Oral   Take 1,000 Units by mouth daily.         Marland Kitchen COLCRYS 0.6 MG PO TABS   Oral   Take 0.6 mg by mouth as needed.          Marland Kitchen COUMADIN 5 MG PO TABS      TAKE 1 TABLET DAILY AS DIRECTED.   30 tablet   3     Dispense as written.   . CYCLOBENZAPRINE HCL 5 MG PO TABS   Oral   Take 1 tablet (5 mg total) by mouth 3 (three) times daily as needed for muscle spasms.   15 tablet   0   . DIGOXIN 0.125 MG PO TABS   Oral   Take 1 tablet (125 mcg total) by mouth as directed. 5 days/ week M-F   30  tablet   11     Brand Medically necessary   . FUROSEMIDE 80 MG PO TABS   Oral   Take 1 tablet (80 mg total) by mouth 2 (two) times daily.   60 tablet   12   . INSULIN ASPART 100 UNIT/ML [place] SOLN   Subcutaneous   Inject 12-20 Units into the skin 3 (three) times daily. Inject 12 units at 8:00am, 16 units at lunch and 20 units at suppertime         . INSULIN GLARGINE 100 UNIT/ML [place] SOLN   Subcutaneous   Inject 30-40 Units into the skin 2 (two) times daily. Inject 40 units in am and 30 units at bedtime         . INSULIN ISOPHANE HUMAN 100 UNIT/ML [place] SUSP   Subcutaneous   Inject 34 Units into the skin at bedtime.          Marland Kitchen PANTOPRAZOLE SODIUM 40 MG PO TBEC   Oral   Take 1 tablet (40 mg total) by mouth daily.   30 tablet   5   . QC PEN NEEDLES 31G X 8 MM MISC               . SPIRONOLACTONE 25 MG PO TABS   Oral   Take 25 mg by mouth daily.           . WARFARIN SODIUM 5 MG PO TABS   Oral   Take 1 tablet (5 mg total) by mouth as directed. 1/2 everyday except Thursday whole tablet   45 tablet   3     Brand name necessary     BP 110/78  Pulse 60  Temp 97.7 F (36.5 C) (Oral)  Resp 18  Ht 5\' 6"  (1.676 m)  Wt 183 lb (83.008 kg)  BMI 29.54 kg/m2  SpO2 100%  Physical Exam  Nursing note and vitals reviewed. Constitutional: She is oriented to person, place, and time. She appears well-developed and well-nourished.  HENT:  Head: Normocephalic and atraumatic.  Eyes: Conjunctivae normal are normal. Pupils are equal, round, and reactive to light.  Neck: Neck supple. No tracheal deviation present. No thyromegaly present.  Cardiovascular: Normal rate and regular rhythm.  No murmur heard. Pulmonary/Chest: Effort normal and breath sounds normal.  Abdominal: Soft. Bowel sounds are normal. She exhibits no distension. There is no tenderness.  Musculoskeletal: Normal range of motion. She exhibits no edema and no tenderness.  Neurological: She is alert and oriented to  person, place, and time. Coordination normal.  Skin: Skin is warm and dry. No rash noted.  Psychiatric: She has a normal mood and affect.    ED Course  Procedures (including critical care time)  Labs Reviewed - No data to display No results found.   No diagnosis found.   Date: 03/13/2012  Rate: 60  Rhythm: Ventricularly Paced  QRS Axis: indeterminate  Intervals: Indeterminate  ST/T Wave abnormalities: indeterminate  Conduction Disutrbances:none  Narrative Interpretation:   Old EKG Reviewed: unchanged    MDM  The patient presents here with weakness, light-headedness.  She has a history of third degree heart block and had a pacer placed in the past.  She is concerned this is not working properly.  The ekg shows a paced rhythm at 60 along with a stable blood pressure and pulses.  There is no rep to interrogate the pacer tonight.  I spoke with Dr. Benjamine Mola this evening who recommends admission for observation and interrogation of the pacer in the morning.  [facility] has been called for this.  Dr. Hilma Favors to see.       I personally performed the services described in this documentation, which was scribed in my presence. The recorded information has been reviewed and is accurate.       Veryl Speak, MD 03/13/12 [phone]

## 2012-03-12 NOTE — ED Notes (Signed)
Pt with light headedness for a week and a half, pt with pacemaker and recently had pacemaker settings changed, takes coumadin for blood thinner

## 2012-03-13 ENCOUNTER — Observation Stay (HOSPITAL_COMMUNITY): Payer: Medicaid Other

## 2012-03-13 DIAGNOSIS — Z950 Presence of cardiac pacemaker: Secondary | ICD-10-CM

## 2012-03-13 DIAGNOSIS — R42 Dizziness and giddiness: Principal | ICD-10-CM

## 2012-03-13 DIAGNOSIS — I4891 Unspecified atrial fibrillation: Secondary | ICD-10-CM

## 2012-03-13 DIAGNOSIS — Z7901 Long term (current) use of anticoagulants: Secondary | ICD-10-CM

## 2012-03-13 LAB — URINE MICROSCOPIC-ADD ON

## 2012-03-13 LAB — HEMOGLOBIN A1C
Hgb A1c MFr Bld: 10.9 % — ABNORMAL HIGH (ref <5.7)
Mean Plasma Glucose: 266 mg/dL — ABNORMAL HIGH (ref <117)

## 2012-03-13 LAB — URINALYSIS, ROUTINE W REFLEX MICROSCOPIC
Glucose, UA: NEGATIVE mg/dL
Ketones, ur: NEGATIVE mg/dL
Protein, ur: NEGATIVE mg/dL
Urobilinogen, UA: 0.2 mg/dL (ref 0.0–1.0)

## 2012-03-13 LAB — DIGOXIN LEVEL: Digoxin Level: 1.2 ng/mL (ref 0.8–2.0)

## 2012-03-13 LAB — GLUCOSE, CAPILLARY: Glucose-Capillary: 187 mg/dL — ABNORMAL HIGH (ref 70–99)

## 2012-03-13 LAB — PROTIME-INR
INR: 2.2 — ABNORMAL HIGH (ref 0.00–1.49)
Prothrombin Time: 23.5 seconds — ABNORMAL HIGH (ref 11.6–15.2)

## 2012-03-13 MED ORDER — FUROSEMIDE 80 MG PO TABS
80.0000 mg | ORAL_TABLET | Freq: Two times a day (BID) | ORAL | Status: DC
  Administered 2012-03-13 – 2012-03-14 (×3): 80 mg via ORAL
  Filled 2012-03-13 (×3): qty 1

## 2012-03-13 MED ORDER — ONDANSETRON HCL 4 MG PO TABS: 4.0000 mg | ORAL_TABLET | Freq: Four times a day (QID) | ORAL | Status: DC | PRN

## 2012-03-13 MED ORDER — WARFARIN SODIUM 2.5 MG PO TABS
2.5000 mg | ORAL_TABLET | Freq: Once | ORAL | Status: AC
  Administered 2012-03-13: 2.5 mg via ORAL
  Filled 2012-03-13: qty 1

## 2012-03-13 MED ORDER — VITAMIN D 1000 UNITS PO TABS
1000.0000 [IU] | ORAL_TABLET | Freq: Every day | ORAL | Status: DC
  Administered 2012-03-13 – 2012-03-14 (×2): 1000 [IU] via ORAL
  Filled 2012-03-13 (×2): qty 1

## 2012-03-13 MED ORDER — BENAZEPRIL HCL 10 MG PO TABS
10.0000 mg | ORAL_TABLET | Freq: Every morning | ORAL | Status: DC
  Administered 2012-03-13 – 2012-03-14 (×2): 10 mg via ORAL
  Filled 2012-03-13 (×2): qty 1

## 2012-03-13 MED ORDER — MECLIZINE HCL 12.5 MG PO TABS
25.0000 mg | ORAL_TABLET | Freq: Two times a day (BID) | ORAL | Status: DC | PRN
  Administered 2012-03-13 – 2012-03-14 (×2): 25 mg via ORAL
  Filled 2012-03-13 (×2): qty 2

## 2012-03-13 MED ORDER — ONDANSETRON HCL 4 MG/2ML IJ SOLN: 4.0000 mg | Freq: Four times a day (QID) | INTRAMUSCULAR | Status: DC | PRN

## 2012-03-13 MED ORDER — ALLOPURINOL 300 MG PO TABS
150.0000 mg | ORAL_TABLET | Freq: Every morning | ORAL | Status: DC
  Administered 2012-03-13 – 2012-03-14 (×2): 150 mg via ORAL
  Filled 2012-03-13 (×2): qty 1

## 2012-03-13 MED ORDER — INSULIN ASPART 100 UNIT/ML ~~LOC~~ SOLN
0.0000 [IU] | Freq: Three times a day (TID) | SUBCUTANEOUS | Status: DC
  Administered 2012-03-13: 5 [IU] via SUBCUTANEOUS
  Administered 2012-03-13 (×2): 3 [IU] via SUBCUTANEOUS
  Administered 2012-03-14: 11 [IU] via SUBCUTANEOUS
  Administered 2012-03-14: 3 [IU] via SUBCUTANEOUS

## 2012-03-13 MED ORDER — ACETAMINOPHEN 325 MG PO TABS: 650.0000 mg | ORAL_TABLET | Freq: Four times a day (QID) | ORAL | Status: DC | PRN

## 2012-03-13 MED ORDER — PANTOPRAZOLE SODIUM 40 MG PO TBEC
40.0000 mg | DELAYED_RELEASE_TABLET | Freq: Every morning | ORAL | Status: DC
  Administered 2012-03-13 – 2012-03-14 (×2): 40 mg via ORAL
  Filled 2012-03-13 (×2): qty 1

## 2012-03-13 MED ORDER — WARFARIN - PHARMACIST DOSING INPATIENT: Freq: Every day | Status: DC

## 2012-03-13 MED ORDER — ACETAMINOPHEN 650 MG RE SUPP: 650.0000 mg | Freq: Four times a day (QID) | RECTAL | Status: DC | PRN

## 2012-03-13 MED ORDER — SODIUM CHLORIDE 0.9 % IJ SOLN
3.0000 mL | Freq: Two times a day (BID) | INTRAMUSCULAR | Status: DC
  Administered 2012-03-13 (×3): 3 mL via INTRAVENOUS
  Filled 2012-03-13: qty 3

## 2012-03-13 MED ORDER — ALUM & MAG HYDROXIDE-SIMETH 200-200-20 MG/5ML PO SUSP: 30.0000 mL | Freq: Four times a day (QID) | ORAL | Status: DC | PRN

## 2012-03-13 MED ORDER — DIGOXIN 125 MCG PO TABS
125.0000 ug | ORAL_TABLET | Freq: Every morning | ORAL | Status: DC
  Administered 2012-03-13 – 2012-03-14 (×2): 125 ug via ORAL
  Filled 2012-03-13 (×2): qty 1

## 2012-03-13 MED ORDER — INSULIN GLARGINE 100 UNIT/ML ~~LOC~~ SOLN
30.0000 [IU] | Freq: Two times a day (BID) | SUBCUTANEOUS | Status: DC
  Administered 2012-03-13 – 2012-03-14 (×3): 30 [IU] via SUBCUTANEOUS

## 2012-03-13 MED ORDER — INSULIN ASPART 100 UNIT/ML ~~LOC~~ SOLN
4.0000 [IU] | Freq: Three times a day (TID) | SUBCUTANEOUS | Status: DC
  Administered 2012-03-13 – 2012-03-14 (×5): 4 [IU] via SUBCUTANEOUS

## 2012-03-13 MED ORDER — CARVEDILOL 12.5 MG PO TABS
25.0000 mg | ORAL_TABLET | Freq: Two times a day (BID) | ORAL | Status: DC
  Administered 2012-03-13 – 2012-03-14 (×3): 25 mg via ORAL
  Filled 2012-03-13 (×3): qty 2

## 2012-03-13 MED ORDER — POLYETHYLENE GLYCOL 3350 17 G PO PACK: 17.0000 g | PACK | Freq: Every day | ORAL | Status: DC | PRN

## 2012-03-13 MED ORDER — SPIRONOLACTONE 25 MG PO TABS
25.0000 mg | ORAL_TABLET | Freq: Every morning | ORAL | Status: DC
  Administered 2012-03-13 – 2012-03-14 (×2): 25 mg via ORAL
  Filled 2012-03-13 (×2): qty 1

## 2012-03-13 MED ORDER — INSULIN ASPART 100 UNIT/ML ~~LOC~~ SOLN
0.0000 [IU] | Freq: Every day | SUBCUTANEOUS | Status: DC
  Administered 2012-03-13: 4 [IU] via SUBCUTANEOUS

## 2012-03-13 MED ORDER — MECLIZINE HCL 12.5 MG PO TABS
12.5000 mg | ORAL_TABLET | Freq: Three times a day (TID) | ORAL | Status: DC
  Administered 2012-03-13 – 2012-03-14 (×2): 12.5 mg via ORAL
  Filled 2012-03-13 (×2): qty 1

## 2012-03-13 NOTE — Progress Notes (Signed)
Subjective: Patient was admitted last night due to dizziness. She had no syncope, chest pain or weakness. She is resting. No new complaint.  Objective: Vital signs in last 24 hours: Temp:  [97.4 F (36.3 C)-98.7 F (37.1 C)] 98 F (36.7 C) (01/17 0500) Pulse Rate:  [59-89] 60  (01/17 0629) Resp:  [16-20] 20  (01/17 0629) BP: (104-138)/(55-81) 113/55 mmHg (01/17 0629) SpO2:  [98 %-100 %] 100 % (01/17 0626) Weight:  [183 lb (83.008 kg)-188 lb 11.4 oz (85.6 kg)] 188 lb 11.4 oz (85.6 kg) (01/17 0100) Weight change:     Intake/Output from previous day:    PHYSICAL EXAM General appearance: alert and no distress Resp: clear to auscultation bilaterally Cardio: regular rate and rhythm GI: soft, non-tender; bowel sounds normal; no masses,  no organomegaly Extremities: extremities normal, atraumatic, no cyanosis or edema  Lab Results:    @labtest @ [facility] No results found for this basename: PHART,PCO2,PO2ART,TCO2,HCO3 in the last 72 hours CULTURES No results found for this or any previous visit (from the past 240 hour(s)). Studies/Results: Ct Head Wo Contrast  03/13/2012  *RADIOLOGY REPORT*  Clinical Data: Dizziness.  Previous stroke.  CT HEAD WITHOUT CONTRAST  Technique:  Contiguous axial images were obtained from the base of the skull through the vertex without contrast.  Comparison: 08/11/2003  Findings: Mild atrophy. There is no evidence of acute intracranial hemorrhage, brain edema, mass lesion, acute infarction,   mass effect, or midline shift. Acute infarct may be inapparent on noncontrast CT.  No other intra-axial abnormalities are seen, and the ventricles and sulci are within normal limits in size and symmetry.   No abnormal extra-axial fluid collections or masses are identified.  No significant calvarial abnormality.  IMPRESSION: 1. Negative for bleed or other acute intracranial process.   Original Report Authenticated By: D. Wallace Going, MD    Dg Chest Port 1 View  03/12/2012   *RADIOLOGY REPORT*  Clinical Data: Dizziness  PORTABLE CHEST - 1 VIEW  Comparison: 04/29/2011  Findings: Left subclavian pacemaker stable position.  Moderate cardiomegaly persists.  Atheromatous aorta.  Lungs clear.  No effusion.  Spurring in the mid thoracic spine.  IMPRESSION:  1.  Stable cardiomegaly.  No acute disease.   Original Report Authenticated By: D. Wallace Going, MD     Medications: I have reviewed the patient's current medications.  Assesment: Principal Problem:  *Dizziness Active Problems:  AODM  CARDIOMYOPATHY  ATRIAL FIBRILLATION, CHRONIC  Chronic kidney disease, stage IV (severe)  Cardiac pacemaker in situ  Chronic anticoagulation  Congenital third degree heart block  History of CVA (cerebrovascular accident)    Plan: Continue telemetry Cardiology consult Continue regular treatment.     LOS: 1 day   Sandar Krinke 03/13/2012, 8:13 AM

## 2012-03-13 NOTE — Plan of Care (Signed)
Problem: Phase I Progression Outcomes Goal: Pain controlled with appropriate interventions Outcome: Progressing Pt does not complain of pain at this time.

## 2012-03-13 NOTE — Progress Notes (Signed)
Combine for Warfarin Indication: atrial fibrillation  Allergies  Allergen Reactions  . Wheat Swelling  . Latex Rash  . Penicillins Rash  . Sulfa Antibiotics Rash   Patient Measurements: Height: 5\' 6"  (167.6 cm) Weight: 188 lb 11.4 oz (85.6 kg) IBW/kg (Calculated) : 59.3   Vital Signs: Temp: 98 F (36.7 C) (01/17 0500) Temp src: Oral (01/17 0500) BP: 113/55 mmHg (01/17 0629) Pulse Rate: 60  (01/17 0902)  Labs:  Basename 03/13/12 1214 03/12/12 2103 03/12/12 1102  HGB -- 12.9 --  HCT -- 39.8 --  PLT -- 173 --  APTT -- -- --  LABPROT 23.5* -- --  INR 2.20* -- 3.3  HEPARINUNFRC -- -- --  CREATININE -- 1.97* --  CKTOTAL -- -- --  CKMB -- -- --  TROPONINI -- <0.30 --    Estimated Creatinine Clearance: 36.8 ml/min (by C-G formula based on Cr of 1.97).   Medical History: Past Medical History  Diagnosis Date  . Reversible ischemic neurological deficit     embolic  . Diabetes mellitus     insulin treated  . Congenital third degree heart block     Guidant VVI pacemaker implanted in 09/1997; 8generator change in 01/2004   . Chronic atrial fibrillation     embolic rind  . Cardiomyopathy     with congestive heart failure   ejection fraction of 30% in 09/2003  . Chronic anticoagulation   . Stroke 1999  . CHF (congestive heart failure)   . Helicobacter pylori gastritis 08/14/2011  . Dysphagia 08/14/2011    FEB 2013 EGD/DIL 16 MM   . GERD (gastroesophageal reflux disease) 08/14/2011  . Chronic kidney disease     creatinine- 1.8 in 09/2006 and 2.0 in 05/2007; 2.05 in 2011, 2.29 in 2012  . Diabetes mellitus, type 2 02/05/2012    Glucose-257 in 01/2012   . Dysphagia 08/14/2011    FEB 2013 EGD/DIL 16 MM; History of gastroesophageal reflux disease; + H. Pylori gastritis     Medications:  Scheduled:    . allopurinol  150 mg Oral q morning - 10a  . benazepril  10 mg Oral q morning - 10a  . carvedilol  25 mg Oral BID  . cholecalciferol   1,000 Units Oral Daily  . digoxin  125 mcg Oral q morning - 10a  . furosemide  80 mg Oral BID  . insulin aspart  0-15 Units Subcutaneous TID WC  . insulin aspart  0-5 Units Subcutaneous QHS  . insulin aspart  4 Units Subcutaneous TID WC  . insulin glargine  30 Units Subcutaneous BID  . pantoprazole  40 mg Oral q morning - 10a  . sodium chloride  3 mL Intravenous Q12H  . spironolactone  25 mg Oral q morning - 10a    Assessment: Okay for Protocol Home dose 2.5-5mg  daily (2.5mg  all days except 5mg  on Mondays. Elevated INR last evening.  Goal of Therapy:  INR 2-3   Plan:  Warfarin 2.5mg  PO x 1 today. Daily PT/INR.  [name] 03/13/2012,12:44 PM

## 2012-03-13 NOTE — Care Management Note (Signed)
    Page 1 of 1   03/13/2012     6:02:59 PM   CARE MANAGEMENT NOTE 03/13/2012  Patient:  [name], [name]   Account Number:  [id number]  Date Initiated:  03/13/2012  Documentation initiated by:  Theophilus Kinds  Subjective/Objective Assessment:   Pt admitted from home with vertigo. Pt lives with her husband and will return home at discharge. Pt is independent with ADLs'.     Action/Plan:   No CM or HH needs noted.   Anticipated DC Date:  03/14/2012   Anticipated DC Plan:  [place]  CM consult      Choice offered to / List presented to:             Status of service:  Completed, signed off Medicare Important Message given?   (If response is "NO", the following Medicare IM given date fields will be blank) Date Medicare IM given:   Date Additional Medicare IM given:    Discharge Disposition:  HOME/SELF CARE  Per UR Regulation:    If discussed at Long Length of Stay Meetings, dates discussed:    Comments:  03/13/12 [place], RN BSN CM

## 2012-03-13 NOTE — Progress Notes (Addendum)
Notified Dr. Legrand Rams due to patient requesting that I call him to come back to the hospital to check her ears.  She c/o of dizziness when she moves and when she tries to get up.  I voiced to Dr. Legrand Rams the patients concerns.  He stated he could not come back at this time.  I voiced to him that the pt stated that he had diagnosed her with vertigo before and said he had her on some type of medication before.  So I recommend that the patient be placed on Meclizine.  New orders given and followed. I also read the note that Jory Sims left for today.

## 2012-03-13 NOTE — Progress Notes (Signed)
UR Chart Review Completed  

## 2012-03-13 NOTE — Progress Notes (Signed)
CARDIOLOGY CONSULT NOTE  Patient ID: [name] MRN: [id number] DOB/AGE: 1960-05-16 52 y.o.  Admit date: 03/12/2012 Referring Physician: Evaristo Bury, MD Primary Cardiologist:Corsica Franson Electrophysiologist: Lovena Le Reason for Consultation: Dizziness and elevated Pro-BNP  Principal Problem:  *Dizziness Active Problems:  AODM  CARDIOMYOPATHY  ATRIAL FIBRILLATION, CHRONIC  Chronic kidney disease, stage IV (severe)  Cardiac pacemaker in situ  Chronic anticoagulation  Congenital third degree heart block  History of CVA (cerebrovascular accident)  HPI: [name] is a 52 y/o patient of both Dr. Lattie Haw and Dr. Lovena Le we are asked to see for recurrent dizziness with frequent occurences over the last few weeks, with worsening last evening while at grocery stores. Lasting very transiently on/off over several minutes and subsiding on its own. No pre-syncope. States that it came in "waves." . [name] has a history of congential third degree HB with Guident Insignia PPM, last seen by Dr. Lovena Le on 03/02/2012 with reprogramming of device sensitivity from 2.5 mv to 5 mv due to "evidence of noise on her ventricular lead" but no other changes in device parameters. Discussion for lead revision was completed should [name] become symptomatic.  [name] also has a history of CM with EF of 20%-25, atrial fibrillation on chronic coumadin, systolic CHF, and diabetes.    Family at bedside state that [name] has not been eating well, and is easily stressed and anxious chronically. [name] is medically complaint and is weighing herself daily. [name] has not had any complaints of DOE, edema or chest discomfort.   CT scan negative for bleed or other acute intracranial process. BP on arrival to ER was 110/78. HR 60. Creatinine 1.97 close to her baseline range of 2.17-1.96 per review of previous labs. Mildly hyponatremic at 133. Alk Phos elevated at 126. Pro-BNP 4526. Wt unchanged from office visit on 03/02/12 at 188  lbs. [name] was not found to be orthostatic on this am orthostatic check.    Review of systems complete and found to be negative unless listed above   Past Medical History  Diagnosis Date  . Reversible ischemic neurological deficit     embolic  . Diabetes mellitus     insulin treated  . Congenital third degree heart block     Guidant VVI pacemaker implanted in 09/1997; 8generator change in 01/2004   . Chronic atrial fibrillation     embolic rind  . Cardiomyopathy     with congestive heart failure   ejection fraction of 30% in 09/2003  . Chronic anticoagulation   . Stroke 1999  . CHF (congestive heart failure)   . Helicobacter pylori gastritis 08/14/2011  . Dysphagia 08/14/2011    FEB 2013 EGD/DIL 16 MM   . GERD (gastroesophageal reflux disease) 08/14/2011  . Chronic kidney disease     creatinine- 1.8 in 09/2006 and 2.0 in 05/2007; 2.05 in 2011, 2.29 in 2012  . Diabetes mellitus, type 2 02/05/2012    Glucose-257 in 01/2012   . Dysphagia 08/14/2011    FEB 2013 EGD/DIL 16 MM; History of gastroesophageal reflux disease; + H. Pylori gastritis     Family History  Problem Relation Age of Onset  . Adopted: Yes  . Colon cancer Neg Hx     History   Social History  . Marital Status: Married    Spouse Name: N/A    Number of Children: 4  . Years of Education: N/A   Occupational History  .     Social History Main Topics  . Smoking status: Never Smoker   .  Smokeless tobacco: Never Used  . Alcohol Use: No  . Drug Use: No  . Sexually Active: Not on file   Other Topics Concern  . Not on file   Social History Narrative  . No narrative on file    Past Surgical History  Procedure Date  . Insert / replace / remove pacemaker 09/1997 w/gen. change in 01/2004    Guidant VVI [employer]  . Colonoscopy 04/23/2011    DESCENDING COLON POLYP/ INTERNAL HEMORRHOIDS/ TORTUOUS COLON  . Upper gastrointestinal endoscopy FEB 2013    RING/DIL TO 16 MM, H PYLORI GASTRITIS     Prescriptions  prior to admission  Medication Sig Dispense Refill  . albuterol (VENTOLIN HFA) 108 (90 BASE) MCG/ACT inhaler Inhale 2 puffs into the lungs every 6 (six) hours as needed. For shortness of breath      . allopurinol (ZYLOPRIM) 100 MG tablet Take 150 mg by mouth every morning.       . benazepril (LOTENSIN) 20 MG tablet Take 10 mg by mouth every morning.       . carvedilol (COREG) 25 MG tablet Take 25 mg by mouth 2 (two) times daily.      . cholecalciferol (VITAMIN D) 1000 UNITS tablet Take 1,000 Units by mouth daily.      . digoxin (LANOXIN) 0.125 MG tablet Take 125 mcg by mouth every morning. 5 days/ week M-F      . furosemide (LASIX) 80 MG tablet Take 1 tablet (80 mg total) by mouth 2 (two) times daily.  60 tablet  12  . insulin aspart (NOVOLOG) 100 UNIT/ML injection Inject 12-20 Units into the skin 3 (three) times daily. Inject 12 units at 8:00am, 16 units at lunch and 20 units at suppertime      . insulin glargine (LANTUS) 100 UNIT/ML injection Inject 30-40 Units into the skin 2 (two) times daily. Inject 40 units in am and 30 units at bedtime      . insulin NPH (HUMULIN N,NOVOLIN N) 100 UNIT/ML injection Inject 34 Units into the skin at bedtime.       . pantoprazole (PROTONIX) 40 MG tablet Take 40 mg by mouth every morning.      Marland Kitchen spironolactone (ALDACTONE) 25 MG tablet Take 25 mg by mouth every morning.       . warfarin (COUMADIN) 5 MG tablet Take 2.5-5 mg by mouth every evening. Take one 1/2 tablet (2.5mg ) everyday except Monday, take one whole tablet (5mg )      . carvedilol (COREG CR) 40 MG 24 hr capsule Take 1 capsule (40 mg total) by mouth daily.  30 capsule  11  . COLCRYS 0.6 MG tablet Take 0.6 mg by mouth daily as needed. For GOUT FLARE       Echocardiogram:05/12/2012 Left ventricle: The cavity size was mildly dilated. Wall thickness was increased in a pattern of mild LVH. Systolic function was severely reduced. The estimated ejection fraction was in the range of 20% to 25%.  Diffuse hypokinesis. There is akinesis of the inferoseptal and apical myocardium. The study is not technically sufficient to allow evaluation of LV diastolic function - underlying atrial fibrillation. - Ventricular septum: Septal motion showed paradox. - Aortic valve: Trileaflet; mildly thickened leaflets. - Mitral valve: Mildly thickened leaflets . Mild regurgitation. - Left atrium: The atrium was moderately to severely dilated. - Right ventricle: Pacer wire or catheter noted in right ventricle. - Right atrium: The atrium was mildly dilated. Pacer wire or catheter noted in right atrium. -  Tricuspid valve: Moderate regurgitation. - Pulmonic valve: Mild regurgitation. - Pulmonary arteries: PA peak pressure: 54mm Hg (S). - Pericardium, extracardiac: A trivial pericardial effusion was identified.     Physical Exam: Blood pressure 113/55, pulse 60, temperature 98 F (36.7 C), temperature source Oral, resp. rate 20, height 5\' 6"  (1.676 m), weight 188 lb 11.4 oz (85.6 kg), SpO2 100.00%.  General: Well developed, well nourished, in no acute distress Head: Eyes PERRLA, No xanthomas.   Normal cephalic and atramatic  Lungs: Clear bilaterally to auscultation and percussion. Heart: HRRR S1 S2 1/6 systolic murmur.   Pulses are 2+ & equal.            No carotid bruit. mild JVD.  No abdominal bruits. No femoral bruits. Abdomen: Bowel sounds are positive, abdomen soft and non-tender without masses or                  Hernia's noted. Msk:  Back normal, normal gait. Normal strength and tone for age. Extremities: No clubbing, cyanosis.  Trace edema.  DP +1 Neuro: Alert and oriented X 3. Psych:  Good affect, responds appropriately  Labs:   Lab Results  Component Value Date   WBC 6.7 03/12/2012   HGB 12.9 03/12/2012   HCT 39.8 03/12/2012   MCV 81.6 03/12/2012   PLT 173 03/12/2012    Lab 03/12/12 2103  NA 133*  K 3.9  CL 97  CO2 27  BUN 41*  CREATININE 1.97*  CALCIUM 9.7  PROT 8.3   BILITOT 0.8  ALKPHOS 126*  ALT 15  AST 20  GLUCOSE 151*   Lab Results  Component Value Date   TROPONINI <0.30 03/12/2012    Lab Results  Component Value Date   CHOL 122 04/18/2009   Lab Results  Component Value Date   HDL 31 04/18/2009   Lab Results  Component Value Date   LDLCALC 30 04/18/2009   Lab Results  Component Value Date   TRIG 149 04/18/2009   Radiology: Ct Head Wo Contrast  03/13/2012  *RADIOLOGY REPORT*  Clinical Data: Dizziness.  Previous stroke.  CT HEAD WITHOUT CONTRAST  Technique:  Contiguous axial images were obtained from the base of the skull through the vertex without contrast.  Comparison: 08/11/2003  Findings: Mild atrophy. There is no evidence of acute intracranial hemorrhage, brain edema, mass lesion, acute infarction,   mass effect, or midline shift. Acute infarct may be inapparent on noncontrast CT.  No other intra-axial abnormalities are seen, and the ventricles and sulci are within normal limits in size and symmetry.   No abnormal extra-axial fluid collections or masses are identified.  No significant calvarial abnormality.  IMPRESSION: 1. Negative for bleed or other acute intracranial process.   Original Report Authenticated By: D. Wallace Going, MD    Dg Chest Port 1 View  03/12/2012  *RADIOLOGY REPORT*  Clinical Data: Dizziness  PORTABLE CHEST - 1 VIEW  Comparison: 04/29/2011  Findings: Left subclavian pacemaker stable position.  Moderate cardiomegaly persists.  Atheromatous aorta.  Lungs clear.  No effusion.  Spurring in the mid thoracic spine.  IMPRESSION:  1.  Stable cardiomegaly.  No acute disease.   Original Report Authenticated By: D. Hassell III, MD    EKG: Ventricular paced with rate of 60 bpm.  ASSESSMENT AND PLAN:   1. Dizziness: Uncertain etiology at this time. No evidence for anemia, CVA, orthostatic hypotension or CHF. Will check U/A for evaluation for UTI. Consider psychologic stress contributing.  2. Congenital 3rd degree  HB, s/p Guident  VVI pacemaker in situ: Recent office visit with Dr Lovena Le on 03/02/2012 with device sensitivity change from 2.5 to 5 mv. I have discussed this with Dr.Taylor by phone who advises that her PPM be re-interrogated. If continues to have "noise" or inhibition will consider replacing ventricular lead sooner. If no issues, continue medical management and could be discharged today.Hulen Skains PPM rep for interrogation today.  3. Non-ischemic CM: Continue current medication treatment with carvedilol, ACE, and lasix.  [name] is on 80 mg BID here in the hospital, but states [name] only takes this once a day, per nephrology recommendations.  Pro-BNP is elevated at 4525. Will follow I/O  4. Atrial Fibrillation: Heart rate is well controlled at present with BB and digoxin. Will check digoxin level for toxicity causing dizziness.   5. Chronic Kidney Disease 6. Diabetes 7. GERD   Phill Myron. Purcell Nails NP [facility] 03/13/2012, 9:09 AM  Cardiology Attending Patient interviewed and examined. Discussed with Jory Sims, NP.  Above note annotated and modified based upon my findings.  Digoxin level-1.2; INR-2.2; UA-poor specimen with negative dipstick, 11-20 WBC and many squamous cells with few bacteria.  Moderate renal insufficiency, about at her baseline. ProBNP level is high consistent with her known cardiomyopathy-unchanged from 03/2011.  Symptoms described as dizziness, but not clearly vertigo or presyncope. No orthostatic hypotension. No pacemaker dysfunction.  [name] has experienced vertigo in the past that responded to treatment with meclizine. Since we have little else to offer her, I suggested an empiric trial. If symptoms improve, [name] can be discharged in the morning. If not, but [name] is not highly symptomatic, I would increase the dose of meclizine to 25 mg 3 times a day.  [name], MD 03/13/2012, 5:48 PM

## 2012-03-14 LAB — GLUCOSE, CAPILLARY: Glucose-Capillary: 332 mg/dL — ABNORMAL HIGH (ref 70–99)

## 2012-03-14 LAB — PROTIME-INR: INR: 2.01 — ABNORMAL HIGH (ref 0.00–1.49)

## 2012-03-14 MED ORDER — MECLIZINE HCL 32 MG PO TABS: 32.0000 mg | ORAL_TABLET | Freq: Three times a day (TID) | ORAL | Status: DC | PRN

## 2012-03-14 NOTE — Progress Notes (Signed)
Went in to discuss the fact that she would be discharged today and the patient voiced concerns about going home.  She stated that she did not feel comfortable going home because she is still dizzy and Dr. Legrand Rams did not check her  according to the patient.  I verbalized understanding.    I called Dr. Legrand Rams  approximately at 1325 and voiced to him the concerns of the patient.  He stated that it is nothing else that can be done for the patient.  He states that he wants to continue with the discharge.  I voiced to him that I had given the patient the Meclizine as prescribed.  I notified the patient of the conversation that I had with the MD.  She appeared to be agitated.  She asked me what her diagnosis was and I told her what was documented in the medical records was "dizziness".  She voices that she does not feel right.     I discussed the situation with Rockie Neighbours, RN the house supervisor and she stated that she was contacting the Case Manager for the system that was on call. She voiced to me to explain to her that she could be held liable for her charges occurred from the time she was discharged until she leaves if she stays.   I went in with Hulan Amato to discuss the situation.  Her daughter asked what the patients diagnosis was and after asking the pt if it was okay to discuss the patients care with her she agreed. I again told her that it was documented in the computer as "dizziness".  She asked if there were any medications ordered and I told her that Meclizine was ordered and the prescription was called to [facility].

## 2012-03-14 NOTE — Progress Notes (Signed)
Pt discharged with instructions and carenotes, meds faxed to pharmacy.  Pt verbalizes understanding.  Pt left the floor via w/c with staff and husband in stable condition.  No complaints of dizziness at this time.

## 2012-03-14 NOTE — Discharge Summary (Signed)
Physician Discharge Summary  Patient ID: [name] MRN: [id number] DOB/AGE: [id number] 53 y.o. Primary Care Physician:Cris Gibby, MD Admit date: 03/12/2012 Discharge date: 03/14/2012    Discharge Diagnoses:   Principal Problem:  *Dizziness Active Problems:  AODM  CARDIOMYOPATHY  ATRIAL FIBRILLATION, CHRONIC  Chronic kidney disease, stage IV (severe)  Cardiac pacemaker in situ  Chronic anticoagulation  Congenital third degree heart block  History of CVA (cerebrovascular accident)     Medication List     As of 03/14/2012  9:30 AM    TAKE these medications         allopurinol 100 MG tablet   Commonly known as: ZYLOPRIM   Take 150 mg by mouth every morning.      benazepril 20 MG tablet   Commonly known as: LOTENSIN   Take 10 mg by mouth every morning.      carvedilol 25 MG tablet   Commonly known as: COREG   Take 25 mg by mouth 2 (two) times daily.      carvedilol 40 MG 24 hr capsule   Commonly known as: COREG CR   Take 1 capsule (40 mg total) by mouth daily.      cholecalciferol 1000 UNITS tablet   Commonly known as: VITAMIN D   Take 1,000 Units by mouth daily.      COLCRYS 0.6 MG tablet   Generic drug: colchicine   Take 0.6 mg by mouth daily as needed. For GOUT FLARE      digoxin 0.125 MG tablet   Commonly known as: LANOXIN   Take 125 mcg by mouth every morning. 5 days/ week M-F      furosemide 80 MG tablet   Commonly known as: LASIX   Take 1 tablet (80 mg total) by mouth 2 (two) times daily.      insulin aspart 100 UNIT/ML injection   Commonly known as: novoLOG   Inject 12-20 Units into the skin 3 (three) times daily. Inject 12 units at 8:00am, 16 units at lunch and 20 units at suppertime      insulin glargine 100 UNIT/ML injection   Commonly known as: LANTUS   Inject 30-40 Units into the skin 2 (two) times daily. Inject 40 units in am and 30 units at bedtime      insulin NPH 100 UNIT/ML injection   Commonly known as: HUMULIN N,NOVOLIN N   Inject 34 Units into the skin at bedtime.      meclizine 32 MG tablet   Commonly known as: ANTIVERT   Take 1 tablet (32 mg total) by mouth 3 (three) times daily as needed.      pantoprazole 40 MG tablet   Commonly known as: PROTONIX   Take 40 mg by mouth every morning.      spironolactone 25 MG tablet   Commonly known as: ALDACTONE   Take 25 mg by mouth every morning.      VENTOLIN HFA 108 (90 BASE) MCG/ACT inhaler   Generic drug: albuterol   Inhale 2 puffs into the lungs every 6 (six) hours as needed. For shortness of breath      warfarin 5 MG tablet   Commonly known as: COUMADIN   Take 2.5-5 mg by mouth every evening. Take one 1/2 tablet (2.5mg ) everyday except Monday, take one whole tablet (5mg )        Discharged Condition:* stable   Consults:* cardiology Significant Diagnostic Studies: Ct Head Wo Contrast  03/13/2012  *RADIOLOGY REPORT*  Clinical Data: Dizziness.  Previous  stroke.  CT HEAD WITHOUT CONTRAST  Technique:  Contiguous axial images were obtained from the base of the skull through the vertex without contrast.  Comparison: 08/11/2003  Findings: Mild atrophy. There is no evidence of acute intracranial hemorrhage, brain edema, mass lesion, acute infarction,   mass effect, or midline shift. Acute infarct may be inapparent on noncontrast CT.  No other intra-axial abnormalities are seen, and the ventricles and sulci are within normal limits in size and symmetry.   No abnormal extra-axial fluid collections or masses are identified.  No significant calvarial abnormality.  IMPRESSION: 1. Negative for bleed or other acute intracranial process.   Original Report Authenticated By: D. Wallace Going, MD    Dg Chest Port 1 View  03/12/2012  *RADIOLOGY REPORT*  Clinical Data: Dizziness  PORTABLE CHEST - 1 VIEW  Comparison: 04/29/2011  Findings: Left subclavian pacemaker stable position.  Moderate cardiomegaly persists.  Atheromatous aorta.  Lungs clear.  No effusion.  Spurring in the  mid thoracic spine.  IMPRESSION:  1.  Stable cardiomegaly.  No acute disease.   Original Report Authenticated By: D. Wallace Going, MD    Mm Digital Screening  02/17/2012  *RADIOLOGY REPORT*  Clinical Data: Screening.  DIGITAL BILATERAL SCREENING MAMMOGRAM WITH CAD  Comparison: 03/07/2009  FINDINGS:  ACR Breast Density Category 1: The breast tissue is almost entirely fatty.  There is no suspicious dominant mass, architectural distortion, or calcification to suggest malignancy. Extensive vascular calcification is present.  Images were processed with CAD.  IMPRESSION: No mammographic evidence of malignancy.  A result letter of this screening mammogram will be mailed directly to the patient.  RECOMMENDATION: Screening mammogram in one year. (Code:SM-B-01Y)  BI-RADS CATEGORY 1:  Negative.   Original Report Authenticated By: Ulyess Blossom, M.D.     Lab Results: Basic Metabolic Panel:  [facility] 03/12/12 2103  NA 133*  K 3.9  CL 97  CO2 27  GLUCOSE 151*  BUN 41*  CREATININE 1.97*  CALCIUM 9.7  MG --  PHOS --   Liver Function Tests:  [facility] 03/12/12 2103  AST 20  ALT 15  ALKPHOS 126*  BILITOT 0.8  PROT 8.3  ALBUMIN 4.1     CBC:  [facility] 03/12/12 2103  WBC 6.7  NEUTROABS 4.3  HGB 12.9  HCT 39.8  MCV 81.6  PLT 173    No results found for this or any previous visit (from the past 240 hour(s)).   Hospital Course: * This is a 52 years old female patient with history of multiple medical illnesses was admitted due to dizziness. Her Ct of the head was negative. She was evaluated by cardiology. No significant finding was identified. Patient is started on meclizine 25 mg po TID PRN and discharged home in stable condition. Discharge Exam: Blood pressure 113/70, pulse 60, temperature 98.4 F (36.9 C), temperature source Oral, resp. rate 20, height 5\' 6"  (1.676 m), weight 185 lb 13.6 oz (84.3 kg), SpO2 98.00%. * Disposition: * home      Discharge Orders    Future Appointments:  Provider: Department: Dept Phone: Center:   03/16/2012 10:15 AM [facility] Nurse [facility] [place] [phone] [facility]   04/02/2012 11:10 AM [facility] Coumadin [facility] [phone] [facility]        Signed: Chrystal Zeimet   03/14/2012, 9:30 AM

## 2012-03-14 NOTE — Plan of Care (Signed)
Problem: Phase III Progression Outcomes Goal: Pain controlled on oral analgesia Outcome: Not Applicable Date Met:  [id number] Pt has not complained of pain with me. Goal: Discharge plan remains appropriate-arrangements made Outcome: Completed/Met Date Met:  03/14/12 Pt has been discharged, but does not feel as if she is ready.

## 2012-03-14 NOTE — Progress Notes (Signed)
Called to talk to patient about her discharge.  Pt had pacer adjustment last week and was told that she might have dizziness or blackout spells and if she did to come to the ED which she did.Dr. Legrand Rams ordered discharge this morning but pt did not want to go home due to dizziness and no reason for the dizziness given.  Called case management and was told to give the Important Message from Medicare which I did and explained to her that she would be responsible for her bill.  The Surveyor, quantity of Case Management called and told me that the Important Message was not for a Medicaid pt.  I went back and apologized to the patient and family for giving incorrect information.  Dr. Legrand Rams was called, he upholds patient discharge and will call [facility] with an order of Zofran.  Daughter left to pick up prescriptions.    Suggested to family they might ask Dr. Legrand Rams for referral to someone for the dizziness.  Family upset with the physician because no information was given by him to them regarding the cause of her dizziness, just information that heart and head was ok.  I informed patient that she must leave today.  No further concerns expressed by patient or husband, just silence.

## 2012-03-14 NOTE — Progress Notes (Signed)
Notified Dr. Legrand Rams due to the fact that the patient has agreed to go home, but would like to have something for nausea ordered.  I recommended Zofran.  MD stated he would call the medication to [facility].   Me and Rockie Neighbours, Rn went back to the room and voiced this to the patient she verbalized understanding.

## 2012-03-15 LAB — URINE CULTURE

## 2012-03-24 ENCOUNTER — Other Ambulatory Visit: Payer: Self-pay | Admitting: *Deleted

## 2012-04-02 ENCOUNTER — Ambulatory Visit (INDEPENDENT_AMBULATORY_CARE_PROVIDER_SITE_OTHER): Payer: Medicaid Other | Admitting: *Deleted

## 2012-04-02 DIAGNOSIS — I4891 Unspecified atrial fibrillation: Secondary | ICD-10-CM

## 2012-04-02 LAB — POCT INR: INR: 2.4

## 2012-04-20 ENCOUNTER — Telehealth: Payer: Self-pay | Admitting: Cardiology

## 2012-04-20 MED ORDER — FUROSEMIDE 80 MG PO TABS: 80.0000 mg | ORAL_TABLET | Freq: Two times a day (BID) | ORAL | Status: DC

## 2012-04-20 NOTE — Telephone Encounter (Signed)
Done

## 2012-04-20 NOTE — Telephone Encounter (Signed)
Refill on Furosemide to [facility]. / tgs

## 2012-04-28 ENCOUNTER — Telehealth: Payer: Self-pay | Admitting: Cardiology

## 2012-04-28 NOTE — Telephone Encounter (Signed)
Left a message with family member for a return call.  Has not called back.  She will need a neuro referral from Dr Legrand Rams, as she sees him on a regular basis.

## 2012-04-28 NOTE — Telephone Encounter (Signed)
Patient wants referral to [facility].  Advised patient that normally her PCP would do that referral.  Please advise patient is this is correct. / tgs

## 2012-04-30 ENCOUNTER — Ambulatory Visit (INDEPENDENT_AMBULATORY_CARE_PROVIDER_SITE_OTHER): Payer: Medicaid Other | Admitting: *Deleted

## 2012-04-30 LAB — POCT INR: INR: 1.3

## 2012-05-07 ENCOUNTER — Ambulatory Visit (INDEPENDENT_AMBULATORY_CARE_PROVIDER_SITE_OTHER): Payer: Medicaid Other | Admitting: *Deleted

## 2012-05-07 LAB — POCT INR: INR: 2.1

## 2012-05-15 ENCOUNTER — Encounter: Payer: Self-pay | Admitting: Cardiology

## 2012-05-15 ENCOUNTER — Ambulatory Visit (INDEPENDENT_AMBULATORY_CARE_PROVIDER_SITE_OTHER): Payer: Medicaid Other | Admitting: Cardiology

## 2012-05-15 VITALS — BP 112/78 | HR 75 | Ht 66.0 in | Wt 192.0 lb

## 2012-05-15 DIAGNOSIS — Z0189 Encounter for other specified special examinations: Secondary | ICD-10-CM

## 2012-05-15 DIAGNOSIS — E119 Type 2 diabetes mellitus without complications: Secondary | ICD-10-CM

## 2012-05-15 DIAGNOSIS — N184 Chronic kidney disease, stage 4 (severe): Secondary | ICD-10-CM

## 2012-05-15 DIAGNOSIS — I428 Other cardiomyopathies: Secondary | ICD-10-CM

## 2012-05-15 DIAGNOSIS — M109 Gout, unspecified: Secondary | ICD-10-CM

## 2012-05-15 DIAGNOSIS — Z7901 Long term (current) use of anticoagulants: Secondary | ICD-10-CM

## 2012-05-15 DIAGNOSIS — R42 Dizziness and giddiness: Secondary | ICD-10-CM

## 2012-05-15 NOTE — Progress Notes (Deleted)
Name: [name]    DOB: September 03, 1960  Age: 52 y.o.  MR#: [id number]       PCP:  [name], MD      Insurance: Payor: MEDICAID [place]  Plan: MEDICAID [place] ACCESS  Product Type: *No Product type*    CC:   No chief complaint on file.   VS Filed Vitals:   05/15/12 1122  BP: 112/78  Pulse: 75  Height: 5\' 6"  (1.676 m)  Weight: 192 lb (87.091 kg)    Weights Current Weight  05/15/12 192 lb (87.091 kg)  03/14/12 185 lb 13.6 oz (84.3 kg)  03/02/12 188 lb (85.276 kg)    Blood Pressure  BP Readings from Last 3 Encounters:  05/15/12 112/78  03/14/12 108/66  03/02/12 108/69     Admit date:  (Not on file) Last encounter with RMR:  04/28/2012   Allergy Wheat; Latex; Penicillins; and Sulfa antibiotics  Current Outpatient Prescriptions  Medication Sig Dispense Refill  . albuterol (VENTOLIN HFA) 108 (90 BASE) MCG/ACT inhaler Inhale 2 puffs into the lungs every 6 (six) hours as needed. For shortness of breath      . allopurinol (ZYLOPRIM) 100 MG tablet Take 150 mg by mouth every morning.       . benazepril (LOTENSIN) 20 MG tablet Take 10 mg by mouth every morning.       . cholecalciferol (VITAMIN D) 1000 UNITS tablet Take 1,000 Units by mouth daily.      Marland Kitchen COLCRYS 0.6 MG tablet Take 0.6 mg by mouth daily as needed. For GOUT FLARE      . digoxin (LANOXIN) 0.125 MG tablet Take 125 mcg by mouth every morning. 5 days/ week M-F      . furosemide (LASIX) 80 MG tablet Take 1 tablet (80 mg total) by mouth 2 (two) times daily.  60 tablet  12  . insulin aspart (NOVOLOG) 100 UNIT/ML injection Inject 12-20 Units into the skin 3 (three) times daily. Inject 12 units at 8:00am, 16 units at lunch and 20 units at suppertime      . insulin glargine (LANTUS) 100 UNIT/ML injection Inject 30-40 Units into the skin 2 (two) times daily. Inject 40 units in am and 30 units at bedtime      . insulin NPH (HUMULIN N,NOVOLIN N) 100 UNIT/ML injection Inject 34 Units into the skin at bedtime.       . meclizine  (ANTIVERT) 32 MG tablet Take 32 mg by mouth 2 (two) times daily.      . pantoprazole (PROTONIX) 40 MG tablet Take 40 mg by mouth every morning.      Marland Kitchen spironolactone (ALDACTONE) 25 MG tablet Take 25 mg by mouth every morning.       . warfarin (COUMADIN) 5 MG tablet Take 2.5-5 mg by mouth every evening. Take one 1/2 tablet (2.5mg ) everyday except Monday, take one whole tablet (5mg )      . carvedilol (COREG CR) 40 MG 24 hr capsule Take 1 capsule (40 mg total) by mouth daily.  30 capsule  11   No current facility-administered medications for this visit.    Discontinued Meds:    Medications Discontinued During This Encounter  Medication Reason  . meclizine (ANTIVERT) 32 MG tablet     Patient Active Problem List  Diagnosis  . AODM  . CARDIOMYOPATHY  . ATRIAL FIBRILLATION, CHRONIC  . Chronic kidney disease, stage IV (severe)  . Cardiac pacemaker in situ  . Chronic anticoagulation  . Congenital third degree heart block  .  Reversible ischemic neurological deficit  . Laboratory test  . Dysphagia  . Dyspepsia  . Dizziness  . History of CVA (cerebrovascular accident)    LABS    Component Value Date/Time   NA 133* 03/12/2012 2103   NA 136 10/16/2011 1250   NA 135 07/04/2011 1155   K 3.9 03/12/2012 2103   K 4.5 10/16/2011 1250   K 4.4 07/04/2011 1155   CL 97 03/12/2012 2103   CL 101 10/16/2011 1250   CL 100 07/04/2011 1155   CO2 27 03/12/2012 2103   CO2 25 10/16/2011 1250   CO2 21 07/04/2011 1155   GLUCOSE 151* 03/12/2012 2103   GLUCOSE 246* 10/16/2011 1250   GLUCOSE 251* 07/04/2011 1155   BUN 41* 03/12/2012 2103   BUN 60* 10/16/2011 1250   BUN 65* 07/04/2011 1155   CREATININE 1.97* 03/12/2012 2103   CREATININE 2.17* 10/16/2011 1250   CREATININE 1.96* 07/04/2011 1155   CREATININE 2.06* 06/05/2011 1437   CREATININE 1.79* 03/28/2011 1431   CREATININE 2.05 07/10/2009 1344   CALCIUM 9.7 03/12/2012 2103   CALCIUM 9.6 10/16/2011 1250   CALCIUM 8.9 07/04/2011 1155   GFRNONAA 28* 03/12/2012 2103   GFRNONAA  32* 03/28/2011 1431   GFRNONAA 28 07/10/2009 1344   GFRAA 32* 03/12/2012 2103   GFRAA 37* 03/28/2011 1431   CMP     Component Value Date/Time   NA 133* 03/12/2012 2103   K 3.9 03/12/2012 2103   CL 97 03/12/2012 2103   CO2 27 03/12/2012 2103   GLUCOSE 151* 03/12/2012 2103   BUN 41* 03/12/2012 2103   CREATININE 1.97* 03/12/2012 2103   CREATININE 2.17* 10/16/2011 1250   CALCIUM 9.7 03/12/2012 2103   PROT 8.3 03/12/2012 2103   ALBUMIN 4.1 03/12/2012 2103   AST 20 03/12/2012 2103   ALT 15 03/12/2012 2103   ALKPHOS 126* 03/12/2012 2103   BILITOT 0.8 03/12/2012 2103   GFRNONAA 28* 03/12/2012 2103   GFRAA 32* 03/12/2012 2103       Component Value Date/Time   WBC 6.7 03/12/2012 2103   WBC 5.5 04/15/2011 1220   WBC 6.2 03/28/2011 1431   HGB 12.9 03/12/2012 2103   HGB 13.3 04/15/2011 1220   HGB 12.9 03/28/2011 1431   HCT 39.8 03/12/2012 2103   HCT 43.0 04/15/2011 1220   HCT 41.3 03/28/2011 1431   MCV 81.6 03/12/2012 2103   MCV 81.3 04/15/2011 1220   MCV 81.5 03/28/2011 1431    Lipid Panel     Component Value Date/Time   CHOL 122 04/18/2009   TRIG 149 04/18/2009   HDL 31 04/18/2009   LDLCALC 30 04/18/2009    ABG No results found for this basename: phart, pco2, pco2art, po2, po2art, hco3, tco2, acidbasedef, o2sat     Lab Results  Component Value Date   TSH 1.865 06/05/2011   BNP (last 3 results)  Recent Labs  03/12/12 2103  PROBNP 4526.0*   Cardiac Panel (last 3 results) No results found for this basename: CKTOTAL, CKMB, TROPONINI, RELINDX,  in the last 72 hours  Iron/TIBC/Ferritin No results found for this basename: iron, tibc, ferritin     EKG Orders placed during the hospital encounter of 03/12/12  . ED EKG  . ED EKG  . EKG 12-LEAD  . EKG 12-LEAD  . EKG     Prior Assessment and Plan Problem List as of 05/15/2012     ICD-9-CM     Cardiology Problems   CARDIOMYOPATHY   Last Assessment & Plan  03/02/2012 Office Visit Written 03/02/2012 11:36 AM by Evans Lance, MD     Despite her  LV dysfunction, she remains with class 1 symptoms. She will continue her current meds. I have discussed the signs that her CHF might be worsening with the patient and her husband.    ATRIAL FIBRILLATION, CHRONIC   Last Assessment & Plan   07/23/2011 Office Visit Written 07/24/2011 10:14 PM by Evans Lance, MD     Her rate is well controlled with underlying CHB. She will continue anti-coagulation.    Congenital third degree heart block   Last Assessment & Plan   04/30/2011 Office Visit Written 04/30/2011  3:51 PM by Lendon Colonel, NP     She sees Dr. Lovena Le in May for face to face pacemaker interrogation.    Reversible ischemic neurological deficit     Other   AODM   Last Assessment & Plan   06/18/2011 Office Visit Edited 06/21/2011  3:46 PM by Yehuda Savannah, MD     Recent control has been suboptimal based upon results from metabolic profiles.  Referred back to Dr. Legrand Rams for additional evaluation and treatment and to Dr. Dorris Fetch as necessary.    Chronic kidney disease, stage IV (severe)   Last Assessment & Plan   07/18/2011 Office Visit Written 07/18/2011  4:18 PM by Yehuda Savannah, MD     Renal function deteriorated modestly a few months ago, but has now stabilized closer to her baseline.  This may have reflected a component of congestive heart failure.    Cardiac pacemaker in situ   Last Assessment & Plan   03/02/2012 Office Visit Written 03/02/2012 11:35 AM by Evans Lance, MD     Her PPM has evidence of noise on her ventricular lead with minimal inhibition of this noise. I have reprogrammed the device sensitivity today to 5 mv from 2.5. I will see her back in 3 months. I have warned her of the symptoms she might experience if she had worsening noise on her lead.     Chronic anticoagulation   Last Assessment & Plan   10/18/2011 Office Visit Written 10/18/2011 12:11 PM by Yehuda Savannah, MD     A recent CBC will be sought from patient's nephrologist.  Stool samples for Hemoccult  testing requested.    Laboratory test   Dysphagia   Last Assessment & Plan   12/25/2011 Office Visit Written 12/25/2011 11:45 AM by Danie Binder, MD     RESOLVED.  OPV IN 6 MOS.    Dyspepsia   Last Assessment & Plan   12/25/2011 Office Visit Written 12/25/2011 11:46 AM by Danie Binder, MD     WITH ICE WATER.  AVOID ICE WATER. DRINK DASANI. CONTINUE PROTONIX. OPV IN 6 MOS.    Dizziness   History of CVA (cerebrovascular accident)       Imaging: No results found.

## 2012-05-15 NOTE — Assessment & Plan Note (Signed)
Cardiomyopathy continues to be well compensated despite chronic elevation of proBNP to approximately 4000. Current medication will be continued.

## 2012-05-15 NOTE — Progress Notes (Signed)
Patient ID: [name], female   DOB: 1960-05-28, 52 y.o.   MRN: [id number]  HPI: Schedule return visit for this woman with congenital third degree AV block, cardiomyopathy and chronic kidney disease.  Since she was admitted to hospital with dizziness a few months ago, she has done quite well. Activity has been limited because of her vertigo, but symptoms have improved with meclizine such the dosage has been decreased. She's been asymptomatic for the past week or so.  Current Outpatient Prescriptions  Medication Sig Dispense Refill  . albuterol (VENTOLIN HFA) 108 (90 BASE) MCG/ACT inhaler Inhale 2 puffs into the lungs every 6 (six) hours as needed. For shortness of breath      . allopurinol (ZYLOPRIM) 100 MG tablet Take 150 mg by mouth every morning.       . benazepril (LOTENSIN) 20 MG tablet Take 10 mg by mouth every morning.       . cholecalciferol (VITAMIN D) 1000 UNITS tablet Take 1,000 Units by mouth daily.      Marland Kitchen COLCRYS 0.6 MG tablet Take 0.6 mg by mouth daily as needed. For GOUT FLARE      . digoxin (LANOXIN) 0.125 MG tablet Take 125 mcg by mouth every morning. 5 days/ week M-F      . furosemide (LASIX) 80 MG tablet Take 1 tablet (80 mg total) by mouth 2 (two) times daily.  60 tablet  12  . insulin aspart (NOVOLOG) 100 UNIT/ML injection Inject 12-20 Units into the skin 3 (three) times daily. Inject 12 units at 8:00am, 16 units at lunch and 20 units at suppertime      . insulin glargine (LANTUS) 100 UNIT/ML injection Inject 30-40 Units into the skin 2 (two) times daily. Inject 40 units in am and 30 units at bedtime      . insulin NPH (HUMULIN N,NOVOLIN N) 100 UNIT/ML injection Inject 34 Units into the skin at bedtime.       . meclizine (ANTIVERT) 32 MG tablet Take 32 mg by mouth 2 (two) times daily.      . pantoprazole (PROTONIX) 40 MG tablet Take 40 mg by mouth every morning.      Marland Kitchen spironolactone (ALDACTONE) 25 MG tablet Take 25 mg by mouth every morning.       . warfarin (COUMADIN) 5  MG tablet Take 2.5-5 mg by mouth every evening. Take one 1/2 tablet (2.5mg ) everyday except Monday, take one whole tablet (5mg )      . carvedilol (COREG CR) 40 MG 24 hr capsule Take 1 capsule (40 mg total) by mouth daily.  30 capsule  11   No current facility-administered medications for this visit.   Allergies  Allergen Reactions  . Wheat Swelling  . Latex Rash  . Penicillins Rash  . Sulfa Antibiotics Rash     Past medical history, social history, and family history reviewed and updated.  ROS: Denies dyspnea, orthopnea, PND, pedal edema or syncope. She has not had an episode of gout for "a long time". All other systems reviewed and are negative.  PHYSICAL EXAM: BP 112/78  Pulse 75  Ht 5\' 6"  (1.676 m)  Wt 87.091 kg (192 lb)  BMI 31 kg/m2; weight increased 7 pounds since 02/2012 Body mass index is 31 kg/(m^2). General-Well developed; no acute distress Body habitus-moderately overweight Neck-No JVD; no carotid bruits Lungs-clear lung fields; resonant to percussion Cardiovascular-normal PMI; normal S1 and S2; grade 2/6 systolic ejection murmur over the pulmonic area with a pulmonary artery pulsation apparent  Abdomen-normal bowel sounds; soft and non-tender without masses or organomegaly Musculoskeletal-No deformities, no cyanosis or clubbing Neurologic-Normal cranial nerves; symmetric strength and tone Skin-Warm, no significant lesions Extremities-distal pulses intact; no edema  [name], MD 05/15/2012  12:05 PM  ASSESSMENT AND PLAN

## 2012-05-15 NOTE — Patient Instructions (Addendum)
Your physician recommends that you schedule a follow-up appointment in: 6 MONTHS 

## 2012-05-15 NOTE — Assessment & Plan Note (Signed)
Patient reports no recent symptoms. Dr. Lowanda Foster may wish to eliminate chronic treatment with allopurinol.

## 2012-05-15 NOTE — Assessment & Plan Note (Addendum)
Diabetic control appears to be improved with specialty care by Dr. Dwyane Dee. She is scheduled to see him in the near future at which time a hemoglobin A1c assessment will be obtained.

## 2012-05-15 NOTE — Assessment & Plan Note (Signed)
Symptoms have improved substantially with appropriate medical therapy for vertigo.

## 2012-05-15 NOTE — Assessment & Plan Note (Signed)
She continues to do well with anticoagulation.

## 2012-05-15 NOTE — Assessment & Plan Note (Addendum)
Renal function has been stable since 2008 with creatinine typically and most recently around 2.0. She is followed by Dr. Lowanda Foster with a return office visit planned in the near future.

## 2012-05-19 ENCOUNTER — Encounter: Payer: Self-pay | Admitting: Neurology

## 2012-05-19 ENCOUNTER — Ambulatory Visit (INDEPENDENT_AMBULATORY_CARE_PROVIDER_SITE_OTHER): Payer: Medicaid Other | Admitting: Neurology

## 2012-05-19 VITALS — BP 114/77 | HR 73 | Temp 98.6°F | Ht 66.0 in | Wt 192.0 lb

## 2012-05-19 DIAGNOSIS — I4891 Unspecified atrial fibrillation: Secondary | ICD-10-CM

## 2012-05-19 DIAGNOSIS — Z950 Presence of cardiac pacemaker: Secondary | ICD-10-CM

## 2012-05-19 DIAGNOSIS — N184 Chronic kidney disease, stage 4 (severe): Secondary | ICD-10-CM

## 2012-05-19 DIAGNOSIS — E119 Type 2 diabetes mellitus without complications: Secondary | ICD-10-CM

## 2012-05-19 DIAGNOSIS — R42 Dizziness and giddiness: Secondary | ICD-10-CM

## 2012-05-19 HISTORY — DX: Dizziness and giddiness: R42

## 2012-05-19 NOTE — Progress Notes (Addendum)
Subjective:    Patient ID: [name] is a 52 y.o. female.  HPI Star Age, MD, PhD [facility] [address] P.O. [place], [place] [address]  Dear Dr. Legrand Rams,   I saw your patient, [name], upon your kind request in my neurologic clinic today for initial consultation of dizziness. the patient is accompanied by her husband. As you know, the patient is a very pleasant 52 y.o.-year-old Right-handed female, with an underlying history of DM, GER, cardiomyopathy, atrial fibrillation, s/p PM placement and hx of TIA in '99, who presents with a 2 month history of dizziness. Symptom onset was gradual and are now stable. No associated nausea, vomiting, vertigo, photophobia. She has had an occasional headache on the top of her head. She does not describe any ringing in her ears. She is regularly followed by her cardiologist for her atrial fibrillation, congenital heart block, pacemaker, atrial fibrillation. She was seen by ear nose throat specialist in January and was told that she may have positional vertigo was given a trial of meclizine. She has been taking meclizine as needed and feels that it has been somewhat helpful. She does not always have a positional sense of vertigo or dizziness. She describes no clear lightheadedness or fainting feeling. She has never lost consciousness. Her husband describes no staring spells, no automatisms. She denies any one-sided weakness, numbness, slurring of speech or tingling. In 1999 she had what she calls a TIA. She had slurring of speech and speech arrest at the time. Her pacemaker was placed in 1999 and she is due for a replacement from what I understand may be in January of next year. Currently she feels without specific symptoms. She says that when the dizziness comes on it's very short lasting, perhaps even just a few seconds. She may be sitting still but often she has noticed she may be worked up secondary to some  stressors at home or just talking for prolonged period of time when symptoms come on. She's been a long-standing diabetic now on insulin. She takes her blood sugar values daily and has had some higher values. She also has chronic kidney disease and her creatinine lingers right around 2 normally. She had a head CT on 03/12/2012 which I reviewed the report and it was negative for any acute problems.  Her Past Medical History Is Significant For: Past Medical History  Diagnosis Date  . Reversible ischemic neurological deficit     embolic  . Diabetes mellitus     insulin treated  . Congenital third degree heart block     Guidant VVI pacemaker implanted in 09/1997; 8generator change in 01/2004   . Chronic atrial fibrillation     embolic rind  . Cardiomyopathy     with congestive heart failure   ejection fraction of 30% in 09/2003  . Chronic anticoagulation   . Stroke 1999  . CHF (congestive heart failure)   . Helicobacter pylori gastritis 08/14/2011  . Dysphagia 08/14/2011    FEB 2013 EGD/DIL 16 MM   . GERD (gastroesophageal reflux disease) 08/14/2011  . Chronic kidney disease     creatinine- 1.8 in 09/2006 and 2.0 in 05/2007; 2.05 in 2011, 2.29 in 2012  . Diabetes mellitus, type 2 02/05/2012    Glucose-257 in 01/2012   . Dysphagia 08/14/2011    FEB 2013 EGD/DIL 16 MM; History of gastroesophageal reflux disease; + H. Pylori gastritis     Her Past Surgical History Is Significant For:  Past Surgical History  Procedure Laterality Date  . Insert / replace / remove pacemaker  09/1997 w/gen. change in 01/2004    Guidant VVI [employer]  . Colonoscopy  04/23/2011    DESCENDING COLON POLYP/ INTERNAL HEMORRHOIDS/ TORTUOUS COLON  . Upper gastrointestinal endoscopy  FEB 2013    RING/DIL TO 16 MM, H PYLORI GASTRITIS    Her Family History Is Significant For: Family History  Problem Relation Age of Onset  . Adopted: Yes  . Colon cancer Neg Hx     Her Social History Is Significant For: History    Social History  . Marital Status: Married    Spouse Name: N/A    Number of Children: 4  . Years of Education: N/A   Occupational History  .     Social History Main Topics  . Smoking status: Never Smoker   . Smokeless tobacco: Never Used  . Alcohol Use: No  . Drug Use: No  . Sexually Active: None   Other Topics Concern  . None   Social History Narrative  . None    Her Allergies Are:  Allergies  Allergen Reactions  . Wheat Swelling  . Latex Rash  . Penicillins Rash  . Sulfa Antibiotics Rash  :   Her Current Medications Are:  Outpatient Encounter Prescriptions as of 05/19/2012  Medication Sig Dispense Refill  . allopurinol (ZYLOPRIM) 100 MG tablet Take 150 mg by mouth every morning.       . benazepril (LOTENSIN) 20 MG tablet Take 10 mg by mouth every morning.       . carvedilol (COREG CR) 40 MG 24 hr capsule Take 1 capsule (40 mg total) by mouth daily.  30 capsule  11  . cholecalciferol (VITAMIN D) 1000 UNITS tablet Take 1,000 Units by mouth daily.      Marland Kitchen COLCRYS 0.6 MG tablet Take 0.6 mg by mouth daily as needed. For GOUT FLARE      . digoxin (LANOXIN) 0.125 MG tablet Take 125 mcg by mouth every morning. 5 days/ week M-F      . furosemide (LASIX) 80 MG tablet Take 1 tablet (80 mg total) by mouth 2 (two) times daily.  60 tablet  12  . insulin aspart (NOVOLOG) 100 UNIT/ML injection Inject 12-20 Units into the skin 3 (three) times daily. Inject 12 units at 8:00am, 16 units at lunch and 20 units at suppertime      . insulin glargine (LANTUS) 100 UNIT/ML injection Inject 30-40 Units into the skin 2 (two) times daily. Inject 40 units in am and 30 units at bedtime      . insulin NPH (HUMULIN N,NOVOLIN N) 100 UNIT/ML injection Inject 34 Units into the skin at bedtime.       . meclizine (ANTIVERT) 32 MG tablet Take 32 mg by mouth 2 (two) times daily.      . pantoprazole (PROTONIX) 40 MG tablet Take 40 mg by mouth every morning.      Marland Kitchen spironolactone (ALDACTONE) 25 MG tablet  Take 25 mg by mouth every morning.       . warfarin (COUMADIN) 5 MG tablet Take 2.5-5 mg by mouth every evening. Take one 1/2 tablet (2.5mg ) everyday except Monday, take one whole tablet (5mg )      . albuterol (VENTOLIN HFA) 108 (90 BASE) MCG/ACT inhaler Inhale 2 puffs into the lungs every 6 (six) hours as needed. For shortness of breath       No facility-administered encounter medications on file as  of 05/19/2012.  : Review of Systems  Neurological:       Fuzziness feeling in her head that can be debilitating when it occurs.  Anxiety can induce the condition.    Objective:  Neurologic Exam  Physical Exam  Physical Examination:   Filed Vitals:   05/19/12 1512  BP: 114/77  Pulse: 73  Temp: 98.6 F (37 C)    General Examination: The patient is a very pleasant 52 y.o. female in no acute distress.  HEENT: Normocephalic, atraumatic, pupils are equal, round and reactive to light and accommodation. Funduscopic exam is normal with sharp disc margins noted. Extraocular tracking is good without nystagmus noted. Normal smooth pursuit is noted. Hearing is grossly intact. Tympanic membranes are clear bilaterally. Face is symmetric with normal facial animation and normal facial sensation. Speech is clear with no dysarthria noted. There is no hypophonia. There is no lip, neck or jaw tremor. Neck is supple with full range of motion. There are no carotid bruits on auscultation. Oropharynx exam reveals normal findings. No significant airway crowding is noted. Mallampati is class I. Tongue protrudes centrally and palate elevates symmetrically.    Chest: is clear to auscultation without wheezing, rhonchi or crackles noted.  Heart: sounds are regular and normal without murmurs, rubs or gallops noted.   Abdomen: is soft, non-tender and non-distended with normal bowel sounds appreciated on auscultation.  Extremities: There is no pitting edema in the distal lower extremities bilaterally. Pedal pulses are  intact.  Skin: is warm and dry with no trophic changes noted.  Musculoskeletal: exam reveals no obvious joint deformities, tenderness or joint swelling or erythema.  Neurologically:  Mental status: The patient is awake, alert and oriented in all 4 spheres. Her memory, attention, language and knowledge are appropriate. There is no aphasia, agnosia, apraxia or anomia. Speech is clear with normal prosody and enunciation. Thought process is linear. Mood is congruent and normal. Affect is normal.  Cranial nerves are as described above under HEENT exam. In addition, shoulder shrug is normal with equal shoulder height noted. Motor exam: Normal bulk, strength and tone is noted. There is no drift, tremor or rebound. Romberg is negative. Reflexes are 2+ throughout. Toes are downgoing bilaterally. Fine motor skills are intact with normal finger taps, normal hand movements, normal rapid alternating patting, normal foot taps and normal foot agility.  Cerebellar testing shows no dysmetria or intention tremor on finger to nose testing. There is no truncal or gait ataxia.  Sensory exam is intact to light touch, pinprick, vibration, temperature sense.  Gait, station and balance are unremarkable. No veering to one side is noted. No leaning to one side. Posture is age-appropriate and stance is narrow based. No problems turning are noted. She turns en block.  Nylan-Barany is negative.  Assessment and Plan:    Assessment and Plan:  In summary, Renia SHALETHA TRA is a very pleasant 52 y.o.-year old female with a history of dizziness. She is doing fairly well at this time.  she has a nonfocal neurological exam and I reassured her in that regard. I explained to her that dizziness almost always is a function of more than one problem. It can be in her case a function of her blood sugar values, blood pressure values, going in and out of atrial fibrillation, her kidney status, her volume status and so forth. I explained to her  that we call that multi-factorial in etiology. I had a long chat with the patient and  her husband  about my findings and the diagnosis, its prognosis and treatment options. We talked about medical treatments and non-pharmacological approaches. We talked about maintaining a healthy lifestyle in general. I encouraged the patient to eat healthy, exercise daily and keep well hydrated, to keep a scheduled bedtime and wake time routine, to not skip any meals and eat healthy snacks in between meals and to have protein with every meal.   I recommended the following at this time:  I suggested that she use meclizine on an as-needed basis. I made her aware that it can make her tired or sleepy and that she may not be able to drive after taking it. She understood.  I answered all their questions today and the patient and her husband were in agreement with the plan. I would like to see her back in 3 months, sooner if the need arises and encouraged them to call with any interim questions, concerns, problems or updates.   Thank you very much for allowing me to participate in the care of this nice patient. If I can be of any further assistance to you please do not hesitate to call me at [phone].  Sincerely,   Star Age, MD, PhD

## 2012-05-19 NOTE — Patient Instructions (Addendum)
I think overall you are doing fairly well and are stable at this point. I do not see any signs of vertigo or one sided neurological signs. Your dizziness may be a function of your blood pressure, your sugar level, your kidney function and your atrial fibrillation. We call that multifactorial origin.   I do have some generic suggestions for you today:   Please make sure that you drink plenty of fluids. I would like for you to exercise daily for example in the form of walking 20-30 minutes every day. Please keep regular sleep-wake schedule, keep regular meal times, do not skip any meals, eat  healthy snacks in between meals. Try to eat protein with every meal.   Engage in social activities in your community and your family and try to keep up with current events by reading the newspaper or watching the news.  I do not think we need to make any changes in your medications at this point. You can use the meclizine as needed, but remember, it can make you drowsy and you may not be able to drive when on meclizine. I think you're stable enough that I can see you back in 3 months. Please call us if you have any interim questions, concerns, or problems or updates to discuss.  Please also call us for any test results so we can go over those with you on the phone. Richardson Landry is my clinical assistant and will answer any of your questions and relay your messages to me.  Our phone number is [phone]. We also have an after hours call service as well as a physician on-call for urgent questions. For any emergencies you know to call 911 or go to the emergency room.

## 2012-05-21 ENCOUNTER — Ambulatory Visit (INDEPENDENT_AMBULATORY_CARE_PROVIDER_SITE_OTHER): Payer: Medicaid Other | Admitting: *Deleted

## 2012-05-21 DIAGNOSIS — I4891 Unspecified atrial fibrillation: Secondary | ICD-10-CM

## 2012-05-21 LAB — POCT INR: INR: 2.2

## 2012-06-18 ENCOUNTER — Ambulatory Visit (INDEPENDENT_AMBULATORY_CARE_PROVIDER_SITE_OTHER): Payer: Medicaid Other | Admitting: *Deleted

## 2012-06-18 DIAGNOSIS — I4891 Unspecified atrial fibrillation: Secondary | ICD-10-CM

## 2012-06-22 ENCOUNTER — Encounter: Payer: Self-pay | Admitting: Cardiology

## 2012-07-09 ENCOUNTER — Encounter: Payer: Self-pay | Admitting: Gastroenterology

## 2012-07-15 ENCOUNTER — Other Ambulatory Visit: Payer: Self-pay | Admitting: Gastroenterology

## 2012-07-16 ENCOUNTER — Telehealth: Payer: Self-pay | Admitting: *Deleted

## 2012-07-16 ENCOUNTER — Ambulatory Visit (INDEPENDENT_AMBULATORY_CARE_PROVIDER_SITE_OTHER): Payer: Medicaid Other | Admitting: *Deleted

## 2012-07-16 DIAGNOSIS — I4891 Unspecified atrial fibrillation: Secondary | ICD-10-CM

## 2012-07-16 MED ORDER — WARFARIN SODIUM 5 MG PO TABS: 2.5000 mg | ORAL_TABLET | Freq: Every evening | ORAL | Status: DC

## 2012-07-16 NOTE — Telephone Encounter (Signed)
Incoming fax of patient medication refill request noted per patient pharmacy. FOR COUMADIN WITH CA rx sent to pharmacy by e-script

## 2012-07-31 ENCOUNTER — Telehealth: Payer: Self-pay | Admitting: *Deleted

## 2012-07-31 NOTE — Telephone Encounter (Signed)
PATIENT WANTS DR Lattie Haw TO REFER HER PERSONALLY TO ANOTHER PHYSICIAN. WOULD LIKE FOR THEM TO BE JUST LIKE HIM.

## 2012-08-02 NOTE — Telephone Encounter (Signed)
Dr. Aundra Dubin if she is willing and able to travel to [place].

## 2012-08-19 ENCOUNTER — Ambulatory Visit (INDEPENDENT_AMBULATORY_CARE_PROVIDER_SITE_OTHER): Payer: Medicaid Other | Admitting: *Deleted

## 2012-08-19 ENCOUNTER — Ambulatory Visit (INDEPENDENT_AMBULATORY_CARE_PROVIDER_SITE_OTHER): Payer: Medicaid Other | Admitting: Cardiology

## 2012-08-19 ENCOUNTER — Encounter: Payer: Self-pay | Admitting: Cardiology

## 2012-08-19 VITALS — BP 100/68 | HR 56 | Ht 66.0 in | Wt 187.0 lb

## 2012-08-19 DIAGNOSIS — N184 Chronic kidney disease, stage 4 (severe): Secondary | ICD-10-CM

## 2012-08-19 DIAGNOSIS — Z7901 Long term (current) use of anticoagulants: Secondary | ICD-10-CM

## 2012-08-19 DIAGNOSIS — I4891 Unspecified atrial fibrillation: Secondary | ICD-10-CM

## 2012-08-19 DIAGNOSIS — I10 Essential (primary) hypertension: Secondary | ICD-10-CM

## 2012-08-19 DIAGNOSIS — Z950 Presence of cardiac pacemaker: Secondary | ICD-10-CM

## 2012-08-19 DIAGNOSIS — I428 Other cardiomyopathies: Secondary | ICD-10-CM

## 2012-08-19 LAB — POCT INR: INR: 1.6

## 2012-08-19 NOTE — Assessment & Plan Note (Signed)
Chemistry profile and CBC are pending.

## 2012-08-19 NOTE — Patient Instructions (Addendum)
Your physician recommends that you schedule a follow-up appointment in: [place] physician recommends that you return for lab work in: [place]

## 2012-08-19 NOTE — Assessment & Plan Note (Signed)
CHF is compensated. LV function remains severely depressed, but patient's functional status is good. Most recent echocardiogram performed in 2013 revealed EF of approximately 20% with mild MR and moderate TR, one of which account for her systolic murmur.

## 2012-08-19 NOTE — Progress Notes (Deleted)
Name: [name]    DOB: [birth year]  Age: 52 y.o.  MR#: [id number]       PCP:  [name], MD      Insurance: Payor: MEDICAID [place] / Plan: MEDICAID [place] ACCESS / Product Type: *No Product type* /   CC:   No chief complaint on file. NO LIST  VS Filed Vitals:   08/19/12 1110  BP: 100/68  Pulse: 56  Height: 5\' 6"  (1.676 m)  Weight: 187 lb (84.823 kg)    Weights Current Weight  08/19/12 187 lb (84.823 kg)  05/19/12 192 lb (87.091 kg)  05/15/12 192 lb (87.091 kg)    Blood Pressure  BP Readings from Last 3 Encounters:  08/19/12 100/68  05/19/12 114/77  05/15/12 112/78     Admit date:  (Not on file) Last encounter with RMR:  05/15/2012   Allergy Wheat; Latex; Penicillins; and Sulfa antibiotics  Current Outpatient Prescriptions  Medication Sig Dispense Refill  . allopurinol (ZYLOPRIM) 100 MG tablet Take 150 mg by mouth every morning.       . benazepril (LOTENSIN) 20 MG tablet Take 10 mg by mouth every morning.       . carvedilol (COREG CR) 40 MG 24 hr capsule Take 1 capsule (40 mg total) by mouth daily.  30 capsule  11  . COLCRYS 0.6 MG tablet Take 0.6 mg by mouth daily as needed. For GOUT FLARE      . digoxin (LANOXIN) 0.125 MG tablet Take 125 mcg by mouth every morning. 5 days/ week M-F      . furosemide (LASIX) 80 MG tablet Take 1 tablet (80 mg total) by mouth 2 (two) times daily.  60 tablet  12  . insulin aspart (NOVOLOG) 100 UNIT/ML injection Inject 12-20 Units into the skin 3 (three) times daily. Inject 12 units at 8:00am, 16 units at lunch and 20 units at suppertime      . insulin glargine (LANTUS) 100 UNIT/ML injection Inject 30-40 Units into the skin 2 (two) times daily. Inject 40 units in am and 30 units at bedtime      . insulin NPH (HUMULIN N,NOVOLIN N) 100 UNIT/ML injection Inject 34 Units into the skin at bedtime.       Marland Kitchen spironolactone (ALDACTONE) 25 MG tablet Take 25 mg by mouth every morning.       . warfarin (COUMADIN) 5 MG tablet Take 0.5-1 tablets  (2.5-5 mg total) by mouth every evening. Take one 1/2 tablet (2.5mg ) everyday except Monday, take one whole tablet (5mg )  30 tablet  3  . pantoprazole (PROTONIX) 40 MG tablet TAKE 1 TABLET BY MOUTH ONCE A DAY.  30 tablet  3   No current facility-administered medications for this visit.    Discontinued Meds:    Medications Discontinued During This Encounter  Medication Reason  . albuterol (VENTOLIN HFA) 108 (90 BASE) MCG/ACT inhaler Error  . cholecalciferol (VITAMIN D) 1000 UNITS tablet Error  . meclizine (ANTIVERT) 32 MG tablet Error    Patient Active Problem List   Diagnosis Date Noted  . Dizziness and giddiness 05/19/2012  . Diabetes 05/19/2012  . Gout 05/15/2012  . Vertigo 03/12/2012  . Dysphagia 08/14/2011  . Laboratory test 06/16/2011  . Chronic anticoagulation   . Congenital third degree heart block   . Reversible ischemic neurological deficit   . Cardiac pacemaker in situ 04/24/2009  . AODM 01/20/2008  . CARDIOMYOPATHY 01/20/2008  . ATRIAL FIBRILLATION, CHRONIC 01/20/2008  . Chronic kidney disease, stage IV (severe)  01/20/2008    LABS    Component Value Date/Time   NA 133* 03/12/2012 2103   NA 136 10/16/2011 1250   NA 135 07/04/2011 1155   K 3.9 03/12/2012 2103   K 4.5 10/16/2011 1250   K 4.4 07/04/2011 1155   CL 97 03/12/2012 2103   CL 101 10/16/2011 1250   CL 100 07/04/2011 1155   CO2 27 03/12/2012 2103   CO2 25 10/16/2011 1250   CO2 21 07/04/2011 1155   GLUCOSE 151* 03/12/2012 2103   GLUCOSE 246* 10/16/2011 1250   GLUCOSE 251* 07/04/2011 1155   BUN 41* 03/12/2012 2103   BUN 60* 10/16/2011 1250   BUN 65* 07/04/2011 1155   CREATININE 1.97* 03/12/2012 2103   CREATININE 2.17* 10/16/2011 1250   CREATININE 1.96* 07/04/2011 1155   CREATININE 2.06* 06/05/2011 1437   CREATININE 1.79* 03/28/2011 1431   CREATININE 2.05 07/10/2009 1344   CALCIUM 9.7 03/12/2012 2103   CALCIUM 9.6 10/16/2011 1250   CALCIUM 8.9 07/04/2011 1155   GFRNONAA 28* 03/12/2012 2103   GFRNONAA 32* 03/28/2011 1431    GFRNONAA 28 07/10/2009 1344   GFRAA 32* 03/12/2012 2103   GFRAA 37* 03/28/2011 1431   CMP     Component Value Date/Time   NA 133* 03/12/2012 2103   K 3.9 03/12/2012 2103   CL 97 03/12/2012 2103   CO2 27 03/12/2012 2103   GLUCOSE 151* 03/12/2012 2103   BUN 41* 03/12/2012 2103   CREATININE 1.97* 03/12/2012 2103   CREATININE 2.17* 10/16/2011 1250   CALCIUM 9.7 03/12/2012 2103   PROT 8.3 03/12/2012 2103   ALBUMIN 4.1 03/12/2012 2103   AST 20 03/12/2012 2103   ALT 15 03/12/2012 2103   ALKPHOS 126* 03/12/2012 2103   BILITOT 0.8 03/12/2012 2103   GFRNONAA 28* 03/12/2012 2103   GFRAA 32* 03/12/2012 2103       Component Value Date/Time   WBC 6.7 03/12/2012 2103   WBC 5.5 04/15/2011 1220   WBC 6.2 03/28/2011 1431   HGB 12.9 03/12/2012 2103   HGB 13.3 04/15/2011 1220   HGB 12.9 03/28/2011 1431   HCT 39.8 03/12/2012 2103   HCT 43.0 04/15/2011 1220   HCT 41.3 03/28/2011 1431   MCV 81.6 03/12/2012 2103   MCV 81.3 04/15/2011 1220   MCV 81.5 03/28/2011 1431    Lipid Panel     Component Value Date/Time   CHOL 122 04/18/2009   TRIG 149 04/18/2009   HDL 31 04/18/2009   LDLCALC 30 04/18/2009    ABG No results found for this basename: phart, pco2, pco2art, po2, po2art, hco3, tco2, acidbasedef, o2sat     Lab Results  Component Value Date   TSH 1.865 06/05/2011   BNP (last 3 results)  Recent Labs  03/12/12 2103  PROBNP 4526.0*   Cardiac Panel (last 3 results) No results found for this basename: CKTOTAL, CKMB, TROPONINI, RELINDX,  in the last 72 hours  Iron/TIBC/Ferritin No results found for this basename: iron, tibc, ferritin     EKG Orders placed during the hospital encounter of 03/12/12  . ED EKG  . ED EKG  . EKG 12-LEAD  . EKG 12-LEAD  . EKG     Prior Assessment and Plan Problem List as of 08/19/2012   AODM   Last Assessment & Plan   05/15/2012 Office Visit Edited 05/17/2012  9:31 AM by Yehuda Savannah, MD     Diabetic control appears to be improved with specialty care by Dr. Dwyane Dee. She is  scheduled to see him in the near future at which time a hemoglobin A1c assessment will be obtained.    CARDIOMYOPATHY   Last Assessment & Plan   05/15/2012 Office Visit Written 05/15/2012 12:21 PM by Yehuda Savannah, MD     Cardiomyopathy continues to be well compensated despite chronic elevation of proBNP to approximately 4000. Current medication will be continued.    ATRIAL FIBRILLATION, CHRONIC   Last Assessment & Plan   07/23/2011 Office Visit Written 07/24/2011 10:14 PM by Evans Lance, MD     Her rate is well controlled with underlying CHB. She will continue anti-coagulation.    Chronic kidney disease, stage IV (severe)   Last Assessment & Plan   05/15/2012 Office Visit Edited 05/17/2012  9:32 AM by Yehuda Savannah, MD     Renal function has been stable since 2008 with creatinine typically and most recently around 2.0. She is followed by Dr. Lowanda Foster with a return office visit planned in the near future.    Cardiac pacemaker in situ   Last Assessment & Plan   03/02/2012 Office Visit Written 03/02/2012 11:35 AM by Evans Lance, MD     Her PPM has evidence of noise on her ventricular lead with minimal inhibition of this noise. I have reprogrammed the device sensitivity today to 5 mv from 2.5. I will see her back in 3 months. I have warned her of the symptoms she might experience if she had worsening noise on her lead.     Chronic anticoagulation   Last Assessment & Plan   05/15/2012 Office Visit Written 05/15/2012 12:22 PM by Yehuda Savannah, MD     She continues to do well with anticoagulation.    Congenital third degree heart block   Last Assessment & Plan   04/30/2011 Office Visit Written 04/30/2011  3:51 PM by Lendon Colonel, NP     She sees Dr. Lovena Le in May for face to face pacemaker interrogation.    Reversible ischemic neurological deficit   Laboratory test   Dysphagia   Last Assessment & Plan   12/25/2011 Office Visit Written 12/25/2011 11:45 AM by Danie Binder, MD      RESOLVED.  OPV IN 6 MOS.    Vertigo   Last Assessment & Plan   05/15/2012 Office Visit Written 05/15/2012 12:25 PM by Yehuda Savannah, MD     Symptoms have improved substantially with appropriate medical therapy for vertigo.    Gout   Last Assessment & Plan   05/15/2012 Office Visit Written 05/15/2012 12:29 PM by Yehuda Savannah, MD     Patient reports no recent symptoms. Dr. Lowanda Foster may wish to eliminate chronic treatment with allopurinol.    Dizziness and giddiness   Diabetes       Imaging: No results found.

## 2012-08-19 NOTE — Assessment & Plan Note (Signed)
Generally stable and therapeutic although recent INR was 1.6. Dosage of warfarin has been adjusted. We will continue to monitor CBC and FOBT to exclude occult GI blood loss.

## 2012-08-19 NOTE — Progress Notes (Signed)
Patient ID: [name], female   DOB: Jul 12, 1960, 52 y.o.   MRN: [id number]  HPI: Scheduled return visit for this lovely woman with long-standing nonischemic cardiomyopathy, atrial fibrillation and conduction system disease. After a prolonged period during which she reported decreased exercise tolerance and increasing dyspnea, she has now returned to the stable compensated state that she had previously maintained for years. She is relatively active, but does not pursue strenuous activity. She sleeps well. She experienced lightheadedness some months back and was treated for vertigo. Since discontinuing meclozine, there has been no recurrence of symptoms. She notes an abnormality that she palpates in her right hand.  Current Outpatient Prescriptions  Medication Sig Dispense Refill  . allopurinol (ZYLOPRIM) 100 MG tablet Take 150 mg by mouth every morning.       . benazepril (LOTENSIN) 20 MG tablet Take 10 mg by mouth every morning.       . carvedilol (COREG CR) 40 MG 24 hr capsule Take 1 capsule (40 mg total) by mouth daily.  30 capsule  11  . COLCRYS 0.6 MG tablet Take 0.6 mg by mouth daily as needed. For GOUT FLARE      . digoxin (LANOXIN) 0.125 MG tablet Take 125 mcg by mouth every morning. 5 days/ week M-F      . furosemide (LASIX) 80 MG tablet Take 1 tablet (80 mg total) by mouth 2 (two) times daily.  60 tablet  12  . insulin aspart (NOVOLOG) 100 UNIT/ML injection Inject 12-20 Units into the skin 3 (three) times daily. Inject 12 units at 8:00am, 16 units at lunch and 20 units at suppertime      . insulin glargine (LANTUS) 100 UNIT/ML injection Inject 30-40 Units into the skin 2 (two) times daily. Inject 40 units in am and 30 units at bedtime      . insulin NPH (HUMULIN N,NOVOLIN N) 100 UNIT/ML injection Inject 34 Units into the skin at bedtime.       Marland Kitchen spironolactone (ALDACTONE) 25 MG tablet Take 25 mg by mouth every morning.       . warfarin (COUMADIN) 5 MG tablet Take 0.5-1 tablets (2.5-5 mg  total) by mouth every evening. Take one 1/2 tablet (2.5mg ) everyday except Monday, take one whole tablet (5mg )  30 tablet  3  . pantoprazole (PROTONIX) 40 MG tablet TAKE 1 TABLET BY MOUTH ONCE A DAY.  30 tablet  3   No current facility-administered medications for this visit.   Allergies  Allergen Reactions  . Wheat Swelling  . Latex Rash  . Penicillins Rash  . Sulfa Antibiotics Rash     Past medical history, social history, and family history reviewed and updated.  ROS: Denies orthopnea, PND, pedal edema, palpitations, lightheadedness or syncope. All other systems reviewed and are negative.  PHYSICAL EXAM: BP 100/68  Pulse 56  Ht 5\' 6"  (1.676 m)  Wt 84.823 kg (187 lb)  BMI 30.2 kg/m2;  Body mass index is 30.2 kg/(m^2). weight decreased 5 pounds since 04/2012 General-Well developed; no acute distress Body habitus-mildly overweight Neck-No JVD; no carotid bruits Lungs-clear lung fields; resonant to percussion Cardiovascular-normal PMI; normal S1 and S2; grade 2-3/6 nearly holosystolic murmur at the left sternal border. Abdomen-normal bowel sounds; soft and non-tender without masses or organomegaly Musculoskeletal-No deformities, no cyanosis or clubbing Neurologic-Normal cranial nerves; symmetric strength and tone Skin-Warm, no significant lesions Extremities-distal pulses intact; no edema; pea-sized nodule at the base of the right thumb-mobile and possibly tendon-associated.  [name], MD 08/19/2012  12:54 PM  ASSESSMENT AND PLAN

## 2012-08-19 NOTE — Assessment & Plan Note (Signed)
Stable anticoagulation has been maintained without recurrent neurologic event.

## 2012-08-21 ENCOUNTER — Encounter: Payer: Self-pay | Admitting: *Deleted

## 2012-08-31 ENCOUNTER — Other Ambulatory Visit: Payer: Medicaid Other

## 2012-09-04 ENCOUNTER — Ambulatory Visit: Payer: Medicaid Other | Admitting: Endocrinology

## 2012-09-07 ENCOUNTER — Ambulatory Visit (INDEPENDENT_AMBULATORY_CARE_PROVIDER_SITE_OTHER): Payer: Medicaid Other | Admitting: *Deleted

## 2012-09-07 DIAGNOSIS — I4891 Unspecified atrial fibrillation: Secondary | ICD-10-CM

## 2012-09-08 ENCOUNTER — Encounter: Payer: Self-pay | Admitting: Gastroenterology

## 2012-09-09 ENCOUNTER — Other Ambulatory Visit: Payer: Self-pay | Admitting: *Deleted

## 2012-09-09 ENCOUNTER — Other Ambulatory Visit: Payer: Medicaid Other

## 2012-09-09 DIAGNOSIS — E119 Type 2 diabetes mellitus without complications: Secondary | ICD-10-CM

## 2012-09-10 ENCOUNTER — Telehealth: Payer: Self-pay | Admitting: Gastroenterology

## 2012-09-10 ENCOUNTER — Ambulatory Visit (INDEPENDENT_AMBULATORY_CARE_PROVIDER_SITE_OTHER): Payer: Medicaid Other | Admitting: Gastroenterology

## 2012-09-10 ENCOUNTER — Encounter: Payer: Self-pay | Admitting: Gastroenterology

## 2012-09-10 ENCOUNTER — Ambulatory Visit: Payer: Medicaid Other | Admitting: Gastroenterology

## 2012-09-10 VITALS — BP 90/60 | HR 82 | Temp 97.8°F | Ht 65.0 in | Wt 183.8 lb

## 2012-09-10 DIAGNOSIS — R1013 Epigastric pain: Secondary | ICD-10-CM

## 2012-09-10 MED ORDER — PANTOPRAZOLE SODIUM 40 MG PO TBEC: DELAYED_RELEASE_TABLET | ORAL | Status: DC

## 2012-09-10 NOTE — Telephone Encounter (Signed)
Patient no showed for appointment

## 2012-09-10 NOTE — Assessment & Plan Note (Signed)
SX CONTROLLED ON PROTONIX.  CONTINUE YOUR WEIGHT LOSS EFFORTS. FOLLOW A LOW FAT/DIABETIC DIET.  HO GIVEN. CONTINUE PROTONIX.  TAKE 30 MINUTES PRIOR TO BREAKFAST DAILY. FOLLOW UP IN 6 MOS.

## 2012-09-10 NOTE — Progress Notes (Signed)
Subjective:    Patient ID: [name], female    DOB: January 04, 1961, 52 y.o.   MRN: [id number]  Toni Fire, MD 1o Cards: [place]   HPI Son going to [employer]. Feels pretty good overall. BETTER THAN SHE FELT AT LAST YEAR/BEGINING OF THE YEAR. REG BMs. COUGH IS GONE. WALKING AND EXERCISING A LITTLE BIT.  PT DENIES FEVER, CHILLS, BRBPR, nausea, vomiting, melena, diarrhea, constipation, abd pain, problems swallowing, problems with sedation, heartburn or indigestion.  Past Medical History  Diagnosis Date  . 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 Diabetes mellitus     insulin treated; Glucose-257 in 01/2012   . Congenital third degree heart block     Guidant VVI pacemaker implanted in 09/1997; 8generator change in 01/2004   . Chronic atrial fibrillation     embolic rind  . Cardiomyopathy     with congestive heart failure   ejection fraction of 30% in 09/2003  . Chronic anticoagulation   . Helicobacter pylori gastritis 08/14/2011  . Chronic kidney disease     creatinine- 1.8 in 09/2006 and 2.0 in 05/2007; 2.05 in 2011, 2.29 in 2012  . Dysphagia 08/14/2011    FEB 2013 EGD/DIL 16 MM; GERD; h/o gastroesophageal reflux disease; + H. Pylori gastritis   . Dizziness and giddiness 05/19/2012   Past Surgical History  Procedure Laterality Date  . Insert / replace / remove pacemaker  09/1997 w/gen. change in 01/2004    Guidant VVI [employer]  . Colonoscopy  04/23/2011    [id number] COLON POLYP/ INTERNAL HEMORRHOIDS/ TORTUOUS COLON  . Upper gastrointestinal endoscopy  FEB 2013    RING/DIL TO 16 MM, H PYLORI GASTRITIS    Allergies  Allergen Reactions  . Wheat Swelling  . Latex Rash  . Penicillins Rash  . Sulfa Antibiotics Rash   Current Outpatient Prescriptions  Medication Sig Dispense Refill  . allopurinol (ZYLOPRIM) 100 MG tablet Take 150 mg by mouth every morning.       . benazepril (LOTENSIN) 20 MG tablet Take 10 mg by mouth every morning.       .  carvedilol (COREG CR) 40 MG 24 hr capsule Take 1 capsule (40 mg total) by mouth daily.    Marland Kitchen COLCRYS 0.6 MG tablet Take 0.6 mg by mouth daily as needed. For GOUT FLARE    . digoxin (LANOXIN) 0.125 MG tablet Take 125 mcg by mouth every morning. 5 days/ week M-F    . furosemide (LASIX) 80 MG tablet Take 1 tablet (80 mg total) by mouth 2 (two) times daily.    . insulin aspart (NOVOLOG) 100 UNIT/ML injection Inject 12-20 Units into the skin 3 (three) times daily. Inject 12 units at 8:00am, 16 units at lunch and 20 units at suppertime    . insulin glargine (LANTUS) 100 UNIT/ML injection Inject 30-40 Units into the skin 2 (two) times daily. Inject 40 units in am and 30 units at bedtime    . insulin NPH (HUMULIN N,NOVOLIN N) 100 UNIT/ML injection Inject 34 Units into the skin at bedtime.     . pantoprazole (PROTONIX) 40 MG tablet TAKE 1 TABLET BY MOUTH ONCE A DAY.    Marland Kitchen spironolactone (ALDACTONE) 25 MG tablet Take 25 mg by mouth every morning.     . warfarin (COUMADIN) 5 MG tablet Take 0.5-1 tablets (2.5-5 mg total) by mouth every evening. Take one 1/2 tablet (2.5mg ) everyday except Monday, take one whole tablet (5mg )        Review  of Systems     Objective:   Physical Exam  Vitals reviewed. Constitutional: She is oriented to person, place, and time. She appears well-developed and well-nourished. No distress.  HENT:  Head: Normocephalic.  Mouth/Throat: Oropharynx is clear and moist. No oropharyngeal exudate.  Eyes: Pupils are equal, round, and reactive to light. No scleral icterus.  Neck: Normal range of motion. Neck supple.  Cardiovascular: Normal rate and regular rhythm.   Murmur heard. Pulmonary/Chest: Effort normal and breath sounds normal. No respiratory distress.  Abdominal: Soft. Bowel sounds are normal. She exhibits no distension. There is no tenderness.  Musculoskeletal: She exhibits no edema.  Lymphadenopathy:    She has no cervical adenopathy.  Neurological: She is alert and  oriented to person, place, and time.  NO FOCAL DEFICITS   Psychiatric: She has a normal mood and affect.          Assessment & Plan:

## 2012-09-10 NOTE — Patient Instructions (Signed)
CONTINUE YOUR WEIGHT LOSS EFFORTS.  CONTINUE PROTONIX. TAKE 30 MINUTES PRIOR TO BREAKFAST.  FOLLOW A LOW FAT/DIABETIC DIET.  FOLLOW UP IN 6 MOS.

## 2012-09-10 NOTE — Telephone Encounter (Signed)
Patient came in 30 mins late an was seen by [facility]

## 2012-09-10 NOTE — Progress Notes (Signed)
Cc PCP 

## 2012-09-14 ENCOUNTER — Telehealth: Payer: Self-pay | Admitting: *Deleted

## 2012-09-14 ENCOUNTER — Ambulatory Visit: Payer: Medicaid Other | Admitting: Endocrinology

## 2012-09-14 NOTE — Telephone Encounter (Signed)
Dr Lattie Haw gave verbal instructions for pt post office visit for the following:  Your Physician recommends that you complete the Hemoccult package given to you today, instructions are enclosed in your packet. Once completed please return to this office.  Unable to contact pt via telephone several times, sent pt certified letter with hemoccult cards and instructions on completing to bring into our office once completed, received pt signed receipt for certified letter [phone] letter advised Pt to call back to our office with any further concerns if necessary

## 2012-09-15 NOTE — Progress Notes (Signed)
REMINDER APPT MADE 

## 2012-09-23 ENCOUNTER — Ambulatory Visit: Payer: Medicaid Other | Admitting: Endocrinology

## 2012-10-01 ENCOUNTER — Ambulatory Visit (INDEPENDENT_AMBULATORY_CARE_PROVIDER_SITE_OTHER): Payer: Medicaid Other | Admitting: *Deleted

## 2012-10-01 DIAGNOSIS — I4891 Unspecified atrial fibrillation: Secondary | ICD-10-CM

## 2012-10-08 ENCOUNTER — Ambulatory Visit: Payer: Medicaid Other | Admitting: Endocrinology

## 2012-10-14 ENCOUNTER — Other Ambulatory Visit: Payer: Self-pay | Admitting: Cardiology

## 2012-10-15 LAB — COMPREHENSIVE METABOLIC PANEL
BUN: 58 mg/dL — ABNORMAL HIGH (ref 6–23)
CO2: 26 mEq/L (ref 19–32)
Calcium: 9.9 mg/dL (ref 8.4–10.5)
Chloride: 99 mEq/L (ref 96–112)
Creat: 2.31 mg/dL — ABNORMAL HIGH (ref 0.50–1.10)
Glucose, Bld: 253 mg/dL — ABNORMAL HIGH (ref 70–99)
Total Bilirubin: 1.2 mg/dL (ref 0.3–1.2)

## 2012-10-15 LAB — CBC
HCT: 39.6 % (ref 36.0–46.0)
Hemoglobin: 12.7 g/dL (ref 12.0–15.0)
MCH: 25.7 pg — ABNORMAL LOW (ref 26.0–34.0)
MCV: 80 fL (ref 78.0–100.0)
RBC: 4.95 MIL/uL (ref 3.87–5.11)
WBC: 5.6 10*3/uL (ref 4.0–10.5)

## 2012-10-20 ENCOUNTER — Encounter: Payer: Self-pay | Admitting: Cardiology

## 2012-10-27 ENCOUNTER — Encounter: Payer: Self-pay | Admitting: *Deleted

## 2012-10-27 ENCOUNTER — Telehealth: Payer: Self-pay | Admitting: *Deleted

## 2012-10-27 NOTE — Telephone Encounter (Signed)
No phone # listed for patient, letter mailed

## 2012-10-27 NOTE — Telephone Encounter (Signed)
Message copied by Roxanna Mew on Tue Oct 27, 2012  1:36 PM ------      Message from: Elayne Snare      Created: Tue Oct 20, 2012  2:19 PM       Blood sugar is out of control, if the patient does not want to come regularly for followup we will have to dismiss her ------

## 2012-10-28 ENCOUNTER — Other Ambulatory Visit: Payer: Self-pay | Admitting: *Deleted

## 2012-10-28 ENCOUNTER — Encounter: Payer: Self-pay | Admitting: *Deleted

## 2012-10-28 DIAGNOSIS — I10 Essential (primary) hypertension: Secondary | ICD-10-CM

## 2012-10-29 ENCOUNTER — Ambulatory Visit (INDEPENDENT_AMBULATORY_CARE_PROVIDER_SITE_OTHER): Payer: Medicaid Other | Admitting: *Deleted

## 2012-10-29 DIAGNOSIS — I4891 Unspecified atrial fibrillation: Secondary | ICD-10-CM

## 2012-10-29 LAB — POCT INR: INR: 1.4

## 2012-11-05 ENCOUNTER — Ambulatory Visit (INDEPENDENT_AMBULATORY_CARE_PROVIDER_SITE_OTHER): Payer: Medicaid Other | Admitting: *Deleted

## 2012-11-05 DIAGNOSIS — I4891 Unspecified atrial fibrillation: Secondary | ICD-10-CM

## 2012-11-05 LAB — POCT INR: INR: 2.2

## 2012-11-23 ENCOUNTER — Other Ambulatory Visit: Payer: Self-pay | Admitting: *Deleted

## 2012-11-23 ENCOUNTER — Other Ambulatory Visit: Payer: Self-pay | Admitting: Cardiology

## 2012-11-23 MED ORDER — WARFARIN SODIUM 5 MG PO TABS: 2.5000 mg | ORAL_TABLET | Freq: Every evening | ORAL | Status: DC

## 2012-11-25 ENCOUNTER — Ambulatory Visit: Payer: Medicaid Other | Admitting: Endocrinology

## 2012-11-26 ENCOUNTER — Encounter: Payer: Self-pay | Admitting: *Deleted

## 2012-12-01 ENCOUNTER — Other Ambulatory Visit (INDEPENDENT_AMBULATORY_CARE_PROVIDER_SITE_OTHER): Payer: Medicaid Other

## 2012-12-01 DIAGNOSIS — E119 Type 2 diabetes mellitus without complications: Secondary | ICD-10-CM

## 2012-12-01 LAB — HEMOGLOBIN A1C: Hgb A1c MFr Bld: 10.7 % — ABNORMAL HIGH (ref 4.6–6.5)

## 2012-12-01 LAB — BASIC METABOLIC PANEL
BUN: 53 mg/dL — ABNORMAL HIGH (ref 6–23)
Chloride: 103 mEq/L (ref 96–112)
Creatinine, Ser: 2.5 mg/dL — ABNORMAL HIGH (ref 0.4–1.2)
GFR: 25.62 mL/min — ABNORMAL LOW (ref >60.00)
Potassium: 4.3 mEq/L (ref 3.5–5.1)
Sodium: 139 mEq/L (ref 135–145)

## 2012-12-02 ENCOUNTER — Ambulatory Visit (INDEPENDENT_AMBULATORY_CARE_PROVIDER_SITE_OTHER): Payer: BC Managed Care – PPO | Admitting: *Deleted

## 2012-12-02 DIAGNOSIS — I4891 Unspecified atrial fibrillation: Secondary | ICD-10-CM

## 2012-12-02 LAB — POCT INR: INR: 2.1

## 2012-12-03 ENCOUNTER — Encounter: Payer: Self-pay | Admitting: Endocrinology

## 2012-12-03 ENCOUNTER — Ambulatory Visit (INDEPENDENT_AMBULATORY_CARE_PROVIDER_SITE_OTHER): Payer: Medicaid Other | Admitting: Endocrinology

## 2012-12-03 VITALS — BP 108/68 | HR 65 | Temp 98.2°F | Resp 12 | Ht 66.0 in | Wt 193.2 lb

## 2012-12-03 DIAGNOSIS — E119 Type 2 diabetes mellitus without complications: Secondary | ICD-10-CM

## 2012-12-03 DIAGNOSIS — N184 Chronic kidney disease, stage 4 (severe): Secondary | ICD-10-CM

## 2012-12-03 NOTE — Progress Notes (Signed)
Patient ID: [name], female   DOB: January 07, 1961, 52 y.o.   MRN: [id number]  [name] is an 52 y.o. female.   Reason for Appointment : Follow up for Type 1 Diabetes  History of Present Illness          Diagnosis: Type 2 diabetes mellitus, date of diagnosis: 1990        Past history: She has been on insulin since 2007. Generally has had poor control in requiring large doses of insulin. Blood sugars also have been previously variable making her control difficult. She has had difficulty following instructions for mealtime insulin consistently in the past  INSULIN regimen is described as: Lantus a.m. 40, -30 in PM. Humulin N 34 hs, NovoLog a.c. 02-10-19  Recent history: She was supposed to be taking 50 units of Lantus in the morning but is taking only 40.  She still appears to have poor control of her diabetes from her high A1c and is requiring large doses of insulin without adequate control. Unable to assess her blood sugar patterns today as her monitor cannot be downloaded; she states that her blood sugars are usually under 200 which is unlikely as A1c is 10.7% Also she was asked to keep a record of her insulin and meals for a week before the visit but has not done so. Again not clear if she is completely compliant with insulin doses all the time Most likely has high readings later in the day and not as many high readings in the morning She appears to be eating small portions and watching her diet but is unable to lose weight Because of her renal insufficiency she cannot use many of the usual drugs that would help her control; apparently did have significant weight gain with a trial of Actos a few years ago and minimal benefit    Glucose monitoring:  done times a day         Glucometer:  Accu-Chek [employer]       Blood Glucose readings from meter download: readings before breakfast: 76-157; acl, 150; acs 200; hs 120-160, highest 300         Hypoglycemia:  occurring at 11 pm with light  evening meal, occasionally       Self-care: The diet that the patient has been following is: Small portions  Meals: 3 meals per day.          Physical activity: exercise: Walking 2/7 days in mall.          Dietician visit: Most recent:.Several years ago           Previous A1c results, 9.9, 11.3  Lab Results  Component Value Date   HGBA1C 10.7* 12/01/2012    No results found for this basename: MICROALBUR, [id number]      Medication List       This list is accurate as of: 12/03/12 10:42 AM.  Always use your most recent med list.               allopurinol 100 MG tablet  Commonly known as:  ZYLOPRIM  Take 150 mg by mouth every morning.     benazepril 20 MG tablet  Commonly known as:  LOTENSIN  Take 10 mg by mouth every morning. Takes 1/2 tablet     carvedilol 40 MG 24 hr capsule  Commonly known as:  COREG CR  Take 1 capsule (40 mg total) by mouth daily.     COLCRYS 0.6 MG tablet  Generic  drug:  colchicine  Take 0.6 mg by mouth daily as needed. For GOUT FLARE     furosemide 80 MG tablet  Commonly known as:  LASIX  Take 1 tablet (80 mg total) by mouth 2 (two) times daily.     insulin aspart 100 UNIT/ML injection  Commonly known as:  novoLOG  Inject 12-20 Units into the skin 3 (three) times daily. Inject 12 units at 8:00am, 16 units at lunch and 20 units at suppertime     insulin glargine 100 UNIT/ML injection  Commonly known as:  LANTUS  Inject 30-40 Units into the skin 2 (two) times daily. Inject 40 units in am and 30 units at bedtime     insulin NPH 100 UNIT/ML injection  Commonly known as:  HUMULIN N,NOVOLIN N  Inject 34 Units into the skin at bedtime.     LANOXIN 0.125 MG tablet  Generic drug:  digoxin  TAKE 1 TABLET BY MOUTH ON MONDAY THROUGH FRIDAY AS DIRECTED.     pantoprazole 40 MG tablet  Commonly known as:  PROTONIX  1 PO 30 MINS PRIOR TO YOUR FIRST MEAL     spironolactone 25 MG tablet  Commonly known as:  ALDACTONE  Take 25 mg by mouth every  morning.     warfarin 5 MG tablet  Commonly known as:  COUMADIN  Take 0.5-1 tablets (2.5-5 mg total) by mouth every evening. Take one 1/2 tablet (2.5mg ) everyday except Monday, take one whole tablet (5mg )        Allergies:  Allergies  Allergen Reactions  . Wheat Swelling  . Latex Rash  . Penicillins Rash  . Sulfa Antibiotics Rash    Past Medical History  Diagnosis Date  . 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 Diabetes mellitus     insulin treated; Glucose-257 in 01/2012   . Congenital third degree heart block     Guidant VVI pacemaker implanted in 09/1997; 8generator change in 01/2004   . Chronic atrial fibrillation     embolic rind  . Cardiomyopathy     with congestive heart failure   ejection fraction of 30% in 09/2003  . Chronic anticoagulation   . Helicobacter pylori gastritis 08/14/2011  . Chronic kidney disease     creatinine- 1.8 in 09/2006 and 2.0 in 05/2007; 2.05 in 2011, 2.29 in 2012  . Dysphagia 08/14/2011    FEB 2013 EGD/DIL 16 MM; GERD; h/o gastroesophageal reflux disease; + H. Pylori gastritis   . Dizziness and giddiness 05/19/2012    Past Surgical History  Procedure Laterality Date  . Insert / replace / remove pacemaker  09/1997 w/gen. change in 01/2004    Guidant VVI [employer]  . Colonoscopy  04/23/2011    [id number] COLON POLYP/ INTERNAL HEMORRHOIDS/ TORTUOUS COLON  . Upper gastrointestinal endoscopy  FEB 2013    RING/DIL TO 16 MM, H PYLORI GASTRITIS    Family History  Problem Relation Age of Onset  . Adopted: Yes  . Colon cancer Neg Hx     Social History:  reports that she has never smoked. She has never used smokeless tobacco. She reports that she does not drink alcohol or use illicit drugs.    Review of Systems:   She has had stable chronic renal insufficiency, followed by nephrologist She has had cardiomyopathy and atrial fibrillation followed by cardiologist No recent pedal edema No history of thyroid  disease  LABS:  Anti-coag visit on 12/02/2012  Component Date Value Range Status  .  INR 12/02/2012 2.1   Final  Appointment on 12/01/2012  Component Date Value Range Status  . Hemoglobin A1C 12/01/2012 10.7* 4.6 - 6.5 % Final   Glycemic Control Guidelines for People with Diabetes:Non Diabetic:  <6%Goal of Therapy: <7%Additional Action Suggested:  >8%   . Sodium 12/01/2012 139  135 - 145 mEq/L Final  . Potassium 12/01/2012 4.3  3.5 - 5.1 mEq/L Final  . Chloride 12/01/2012 103  96 - 112 mEq/L Final  . CO2 12/01/2012 25  19 - 32 mEq/L Final  . Glucose, Bld 12/01/2012 142* 70 - 99 mg/dL Final  . BUN 12/01/2012 53* 6 - 23 mg/dL Final  . Creatinine, Ser 12/01/2012 2.5* 0.4 - 1.2 mg/dL Final  . Calcium 12/01/2012 8.8  8.4 - 10.5 mg/dL Final  . GFR 12/01/2012 25.62* >60.00 mL/min Final    Physical Examination:  BP 108/68  Pulse 65  Temp(Src) 98.2 F (36.8 C)  Resp 12  Ht 5\' 6"  (1.676 m)  Wt 193 lb 3.2 oz (87.635 kg)  BMI 31.2 kg/m2  SpO2 97%         ASSESSMENT:  Diabetes type 1:   She continues to have very poor control of her diabetes and although is insulin-dependent is requiring large doses of insulin without adequate control. She also did not increase her Lantus as directed on her last visit and is taking relatively small doses Unable to assess her blood sugar patterns today as her monitor cannot be downloaded; she claimed that her blood sugars are usually not high which is unlikely as A1c is 10.7% Most likely has high readings later in the day and not as many high readings in the morning She claimed that she is eating small portions and watching her diet but is unable to lose weight Because of her renal insufficiency she cannot use many of the usual drugs that would help her control; apparently did have significant weight gain with a trial of Actos a few years ago and minimal benefit  Complications:? Nephropathy  PLAN:   Since her afternoon readings are probably the  highest of the day will increase her morning Lantus to 45 minutes  She will be given a trial of Victoza for its multiple benefits of her blood sugar control and weight loss Have discussed with the patient the use of GLP-1 drugs and the mechanism of how they work and benefit blood glucose as well as potentially help with weight loss, increase satiety and gastric fullness. Explained possible side effects and safety information . Discussed dosage titration of Victoza  starting with 0.6 mg and increasing to 1.2 mg, timing of injection and use of the flex pen. Patient brochure given  She was advised to check her blood sugar consistently including some after meals and bring her monitor for download She probably will need less insulin as her blood sugars come down with Victoza and discussed how to adjust the dose especially bedtime NPH and Lantus based on fasting blood sugar trend Since she will most likely be reducing her portions as Victoza will reduce her NovoLog insulin by 4 units  Counseling time over 50% of today's 25 minute visit  Charizma Gardiner 12/03/2012, 10:42 AM

## 2012-12-03 NOTE — Patient Instructions (Addendum)
Lantus 45 daily  Start VICTOZA injection with the sample pen once daily at the same time of the day.  Dial the dose to 0.6 mg for the first week.  You may  experience nausea in the first few days which usually gets better the After 1 week increase the dose to 1.2mg  daily if no nausea.  You may inject in the stomach, thigh or arm.   You will feel fullness of the stomach with starting the medication and should try to keep portions of food small.    Reduce Novolog by 4 units   Please check blood sugars at least half the time about 2 hours after any meal and as directed on waking up. Please bring blood sugar monitor to each visit

## 2012-12-10 ENCOUNTER — Other Ambulatory Visit: Payer: Self-pay | Admitting: *Deleted

## 2012-12-10 MED ORDER — INSULIN GLARGINE 100 UNIT/ML ~~LOC~~ SOLN: 30.0000 [IU] | Freq: Two times a day (BID) | SUBCUTANEOUS | Status: DC

## 2012-12-15 ENCOUNTER — Telehealth: Payer: Self-pay | Admitting: Internal Medicine

## 2012-12-15 ENCOUNTER — Encounter: Payer: Self-pay | Admitting: Internal Medicine

## 2012-12-15 NOTE — Telephone Encounter (Addendum)
12-15-12 SENT PAST DUE LETTER, WAS DUE TO SEE DEVICE 05-2012

## 2012-12-30 ENCOUNTER — Encounter: Payer: Self-pay | Admitting: *Deleted

## 2012-12-31 ENCOUNTER — Other Ambulatory Visit: Payer: Self-pay

## 2013-01-04 ENCOUNTER — Telehealth: Payer: Self-pay | Admitting: *Deleted

## 2013-01-04 NOTE — Telephone Encounter (Signed)
Pt says she started on Victoza last week and all was going well until Friday, she said her Sugars are dropping low and she's had diarrhea, she wants to know if she should cut back on her insulin?

## 2013-01-04 NOTE — Telephone Encounter (Signed)
Need to know exactly when her blood sugar is getting low and blood sugar readings for the last 2 days She has been on Victoza for one month, please confirm that and also the dose May need to see her sooner if needing insulin adjustment

## 2013-01-05 NOTE — Telephone Encounter (Signed)
Please try to reach the patient again

## 2013-01-06 NOTE — Telephone Encounter (Signed)
Pt is aware, instructed to call back if she has any other problems

## 2013-01-06 NOTE — Telephone Encounter (Signed)
Pt says she is taking 0.6 mg of the victoza, she says her diarrhea has gotten better after stopping the victoza for a few days, she started back on it Monday. She said her BS are; Monday before breakfast 144, before dinner 138 at bedtime 85 Tuesday before breakfast 118, before dinner 92 and at bedtime 198, she said she has bee taking 4 units less of the novolog like you told her to do when she has low sugars.

## 2013-01-06 NOTE — Telephone Encounter (Signed)
Continue same doses  Of insulin and Victoza, make sure she takes her NovoLog at meals even when the blood sugar is normal, may reduce by another 2 units if below 80

## 2013-01-07 ENCOUNTER — Encounter: Payer: Self-pay | Admitting: Internal Medicine

## 2013-01-07 ENCOUNTER — Ambulatory Visit: Payer: Medicaid Other | Admitting: Internal Medicine

## 2013-01-07 ENCOUNTER — Ambulatory Visit: Payer: Medicaid Other | Admitting: Endocrinology

## 2013-01-07 ENCOUNTER — Ambulatory Visit (INDEPENDENT_AMBULATORY_CARE_PROVIDER_SITE_OTHER): Payer: Medicaid Other | Admitting: Internal Medicine

## 2013-01-07 VITALS — BP 112/74 | HR 76 | Ht 66.0 in | Wt 192.0 lb

## 2013-01-07 DIAGNOSIS — Z950 Presence of cardiac pacemaker: Secondary | ICD-10-CM

## 2013-01-07 DIAGNOSIS — I4891 Unspecified atrial fibrillation: Secondary | ICD-10-CM

## 2013-01-07 DIAGNOSIS — Q246 Congenital heart block: Secondary | ICD-10-CM

## 2013-01-07 LAB — MDC_IDC_ENUM_SESS_TYPE_INCLINIC
Battery Remaining Longevity: 1
Implantable Pulse Generator Serial Number: 119679
Lead Channel Impedance Value: 350 Ohm
Lead Channel Pacing Threshold Amplitude: 0.5 V
Lead Channel Pacing Threshold Pulse Width: 0.5 ms
Lead Channel Sensing Intrinsic Amplitude: 7.8 mV
Lead Channel Setting Pacing Amplitude: 2.5 V

## 2013-01-07 NOTE — Assessment & Plan Note (Addendum)
Her [employer] single chamber pacemaker is working normally. We'll plan to recheck in several months. We discussed the possibility of change out of her device to a biventricular ICD. My anticipation would be that we proceed with this procedure once her diabetes is under better control.

## 2013-01-07 NOTE — Assessment & Plan Note (Signed)
her ventricular rate is well controlled. No change in medical therapy.

## 2013-01-07 NOTE — Patient Instructions (Addendum)
Your physician recommends that you schedule a follow-up appointment in: 3 months in the [place] office

## 2013-01-07 NOTE — Progress Notes (Signed)
HPI [name] returns today for followup. She is a very pleasant 52 year old woman with congenital complete heart block, chronic atrial fibrillation, who also has left ventricular dysfunction. For years, the patient has been class I. She developed worsening heart failure symptoms several months ago, and required up titration of her medical therapy. Her ejection fraction is 20% by echo. Since her up titration in treatment, her heart failure symptoms have improved. She is now class IIA. The patient has been struggling with diabetes. Her hemoglobin A1c was most recently nearly 11. She has been under adjustment with her diabetic medications. She admits to some dietary indiscretion. Her husband states that she eats less than she used to. Allergies  Allergen Reactions  . Wheat Swelling  . Latex Rash  . Penicillins Rash  . Sulfa Antibiotics Rash     Current Outpatient Prescriptions  Medication Sig Dispense Refill  . allopurinol (ZYLOPRIM) 100 MG tablet Take 150 mg by mouth every morning.       . benazepril (LOTENSIN) 20 MG tablet Take 10 mg by mouth every morning. Takes 1/2 tablet      . carvedilol (COREG CR) 40 MG 24 hr capsule Take 1 capsule (40 mg total) by mouth daily.  30 capsule  11  . COLCRYS 0.6 MG tablet Take 0.6 mg by mouth daily as needed. For GOUT FLARE      . furosemide (LASIX) 80 MG tablet Take 80 mg by mouth daily.      . insulin aspart (NOVOLOG) 100 UNIT/ML injection Inject 12-20 Units into the skin 3 (three) times daily. Inject 12 units at 8:00am, 16 units at lunch and 20 units at suppertime      . insulin glargine (LANTUS) 100 UNIT/ML injection Inject 0.3-0.4 mLs (30-40 Units total) into the skin 2 (two) times daily. Inject 40 units in am and 30 units at bedtime  5 pen  5  . insulin NPH (HUMULIN N,NOVOLIN N) 100 UNIT/ML injection Inject 34 Units into the skin at bedtime.       Marland Kitchen LANOXIN 125 MCG tablet TAKE 1 TABLET BY MOUTH ON MONDAY THROUGH FRIDAY AS DIRECTED.  20  tablet  3  . Liraglutide (VICTOZA) 18 MG/3ML SOPN Inject into the skin as directed.      . pantoprazole (PROTONIX) 40 MG tablet 1 PO 30 MINS PRIOR TO YOUR FIRST MEAL  30 tablet  11  . spironolactone (ALDACTONE) 25 MG tablet Take 25 mg by mouth every morning.       . warfarin (COUMADIN) 5 MG tablet Take 0.5-1 tablets (2.5-5 mg total) by mouth every evening. Take one 1/2 tablet (2.5mg ) everyday except Monday, take one whole tablet (5mg )  30 tablet  3   No current facility-administered medications for this visit.     Past Medical History  Diagnosis Date  . 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 Diabetes mellitus     insulin treated; Glucose-257 in 01/2012   . Congenital third degree heart block     Guidant VVI pacemaker implanted in 09/1997; 8generator change in 01/2004   . Chronic atrial fibrillation     embolic rind  . Cardiomyopathy     with congestive heart failure   ejection fraction of 30% in 09/2003  . Chronic anticoagulation   . Helicobacter pylori gastritis 08/14/2011  . Chronic kidney disease     creatinine- 1.8 in 09/2006 and 2.0 in 05/2007; 2.05 in 2011, 2.29 in 2012  . Dysphagia  08/14/2011    FEB 2013 EGD/DIL 16 MM; GERD; h/o gastroesophageal reflux disease; + H. Pylori gastritis   . Dizziness and giddiness 05/19/2012    ROS:   All systems reviewed and negative except as noted in the HPI.   Past Surgical History  Procedure Laterality Date  . Insert / replace / remove pacemaker  09/1997 w/gen. change in 01/2004    Guidant VVI [employer]  . Colonoscopy  04/23/2011    [id number] COLON POLYP/ INTERNAL HEMORRHOIDS/ TORTUOUS COLON  . Upper gastrointestinal endoscopy  FEB 2013    RING/DIL TO 16 MM, H PYLORI GASTRITIS     Family History  Problem Relation Age of Onset  . Adopted: Yes  . Colon cancer Neg Hx      History   Social History  . Marital Status: Married    Spouse Name: N/A    Number of Children: 4  . Years of Education:  N/A   Occupational History  .     Social History Main Topics  . Smoking status: Never Smoker   . Smokeless tobacco: Never Used  . Alcohol Use: No  . Drug Use: No  . Sexual Activity: Not on file   Other Topics Concern  . Not on file   Social History Narrative  . No narrative on file     BP 112/74  Pulse 76  Ht 5\' 6"  (1.676 m)  Wt 192 lb (87.091 kg)  BMI 31.00 kg/m2  Physical Exam:  Well appearing 52 year old woman,NAD HEENT: Unremarkable Neck:  No JVD, no thyromegally Lymphatics:  No adenopathy Back:  No CVA tenderness Lungs:  Clear with minimal rales in the bases bilaterally. No wheezes or rhonchi. No increased work of breathing. HEART:  Regular rate rhythm, no murmurs, no rubs, no clicks Abd:  soft, obese,positive bowel sounds, no organomegally, no rebound, no guarding Ext:  2 plus pulses, no edema, no cyanosis, no clubbing Skin:  No rashes no nodules Neuro:  CN II through XII intact, motor grossly intact  DEVICE  Normal device function.  See PaceArt for details.   Assess/Plan:

## 2013-01-11 ENCOUNTER — Other Ambulatory Visit: Payer: Self-pay | Admitting: *Deleted

## 2013-01-11 MED ORDER — INSULIN PEN NEEDLE 32G X 5 MM MISC: 1.0000 | Freq: Every day | Status: DC

## 2013-01-11 MED ORDER — LIRAGLUTIDE 18 MG/3ML ~~LOC~~ SOPN: 0.6000 mg | PEN_INJECTOR | SUBCUTANEOUS | Status: DC

## 2013-01-13 ENCOUNTER — Ambulatory Visit (INDEPENDENT_AMBULATORY_CARE_PROVIDER_SITE_OTHER): Payer: Medicaid Other | Admitting: *Deleted

## 2013-01-13 DIAGNOSIS — I4891 Unspecified atrial fibrillation: Secondary | ICD-10-CM

## 2013-01-13 LAB — POCT INR: INR: 4.2

## 2013-01-19 ENCOUNTER — Other Ambulatory Visit: Payer: Self-pay | Admitting: Obstetrics & Gynecology

## 2013-01-19 DIAGNOSIS — Z139 Encounter for screening, unspecified: Secondary | ICD-10-CM

## 2013-01-20 ENCOUNTER — Ambulatory Visit: Payer: Medicaid Other | Admitting: Endocrinology

## 2013-01-27 ENCOUNTER — Encounter: Payer: Self-pay | Admitting: Endocrinology

## 2013-01-27 ENCOUNTER — Ambulatory Visit (INDEPENDENT_AMBULATORY_CARE_PROVIDER_SITE_OTHER): Payer: Medicaid Other | Admitting: Endocrinology

## 2013-01-27 VITALS — BP 112/70 | HR 60 | Temp 98.0°F | Resp 12 | Ht 65.0 in | Wt 189.5 lb

## 2013-01-27 DIAGNOSIS — N184 Chronic kidney disease, stage 4 (severe): Secondary | ICD-10-CM | POA: Insufficient documentation

## 2013-01-27 DIAGNOSIS — E1129 Type 2 diabetes mellitus with other diabetic kidney complication: Secondary | ICD-10-CM

## 2013-01-27 LAB — LIPID PANEL
Cholesterol: 144 mg/dL (ref 0–200)
Total CHOL/HDL Ratio: 6
Triglycerides: 209 mg/dL — ABNORMAL HIGH (ref 0.0–149.0)

## 2013-01-27 LAB — COMPREHENSIVE METABOLIC PANEL
ALT: 21 U/L (ref 0–35)
AST: 23 U/L (ref 0–37)
Alkaline Phosphatase: 141 U/L — ABNORMAL HIGH (ref 39–117)
BUN: 58 mg/dL — ABNORMAL HIGH (ref 6–23)
Calcium: 8.8 mg/dL (ref 8.4–10.5)
Chloride: 104 mEq/L (ref 96–112)
Creatinine, Ser: 2.6 mg/dL — ABNORMAL HIGH (ref 0.4–1.2)
Glucose, Bld: 176 mg/dL — ABNORMAL HIGH (ref 70–99)
Total Bilirubin: 0.8 mg/dL (ref 0.3–1.2)

## 2013-01-27 LAB — LDL CHOLESTEROL, DIRECT: Direct LDL: 93.1 mg/dL

## 2013-01-27 NOTE — Progress Notes (Signed)
Patient ID: [name], female   DOB: 06-12-1960, 52 y.o.   MRN: [id number]  [name] is an 52 y.o. female.   Reason for Appointment : Follow up for Type 1 Diabetes  History of Present Illness   Less appetite On Victoza 6 weeks, 1st diarrhea          Diagnosis: Type 2 diabetes mellitus, date of diagnosis: 1990        Past history: She has been on insulin since 2007. Generally has had poor control in requiring large doses of insulin. Blood sugars also have been previously variable making her control difficult. She has had difficulty following instructions for mealtime insulin consistently in the past  INSULIN regimen is described as: Lantus a.m. 45, - 45 in PM. Humulin N 34 hs, NovoLog a.c.8- 12-16   Recent history: She was started on Victoza on her last visit about 2 months ago because of persistent poor control and using large doses of insulin. Also was tending to have relatively high postprandial readings with significant variability She started her Victoza about 6 weeks ago. Initially he appeared to have diarrhea and this has resolved. She did not increase the dose to 1.2 mg despite instructions However her blood sugars appear to be overall much better with the small dose Currently she has significant decrease in her appetite during the day with a Victoza and usually eating small meals at breakfast and lunch Her NOVOLOG dose has been reduced but her basal insulin doses are about the same She appears to have occasional hypoglycemia but less recently with only occasional low normal readings fasting or bedtime   Glucose monitoring:  done times a day         Glucometer:  Accu-Chek [employer]       Blood Glucose readings from meter download:   PREMEAL Breakfast  p.c. bfst Dinner  PC supper  Overall  Glucose range:  69-138   101-177   135-236   113-344    Mean/median:  110   97   ? 150   170   143    Hypoglycemia:  minimal, lowest reading 69 recently at 10 AM  Self-care: The diet  that the patient has been following is: Small portions  Meals: 3 meals per day. Supper at about 6 PM          Physical activity: exercise: Walking 3/7 days in mall.          Dietician visit: Most recent:.Several years ago           Wt Readings from Last 3 Encounters:  01/27/13 189 lb 8 oz (85.957 kg)  01/07/13 192 lb (87.091 kg)  12/03/12 193 lb 3.2 oz (87.635 kg)    Previous A1c results, 9.9, 11.3 Lab Results  Component Value Date   HGBA1C 10.7* 12/01/2012   HGBA1C 10.9* 03/13/2012   HGBA1C 9.1 04/18/2009   Lab Results  Component Value Date   LDLCALC 30 04/18/2009   CREATININE 2.5* 12/01/2012     No results found for this basename: MICROALBUR,  [id number]      Medication List       This list is accurate as of: 01/27/13 11:58 AM.  Always use your most recent med list.               allopurinol 100 MG tablet  Commonly known as:  ZYLOPRIM  Take 150 mg by mouth every morning.     benazepril 20 MG tablet  Commonly known  as:  LOTENSIN  Take 10 mg by mouth every morning. Takes 1/2 tablet     carvedilol 40 MG 24 hr capsule  Commonly known as:  COREG CR  Take 1 capsule (40 mg total) by mouth daily.     COLCRYS 0.6 MG tablet  Generic drug:  colchicine  Take 0.6 mg by mouth daily as needed. For GOUT FLARE     furosemide 80 MG tablet  Commonly known as:  LASIX  Take 80 mg by mouth daily.     insulin aspart 100 UNIT/ML injection  Commonly known as:  novoLOG  Inject 12-20 Units into the skin 3 (three) times daily. Inject 12 units at 8:00am, 16 units at lunch and 20 units at suppertime     insulin glargine 100 UNIT/ML injection  Commonly known as:  LANTUS  Inject 0.3-0.4 mLs (30-40 Units total) into the skin 2 (two) times daily. Inject 40 units in am and 30 units at bedtime     insulin NPH 100 UNIT/ML injection  Commonly known as:  HUMULIN N,NOVOLIN N  Inject 34 Units into the skin at bedtime.     Insulin Pen Needle 32G X 5 MM Misc  Commonly known as:  NOVOTWIST  1  each by Does not apply route daily.     LANOXIN 0.125 MG tablet  Generic drug:  digoxin  TAKE 1 TABLET BY MOUTH ON MONDAY THROUGH FRIDAY AS DIRECTED.     Liraglutide 18 MG/3ML Sopn  Commonly known as:  VICTOZA  Inject 0.6 mg into the skin as directed.     pantoprazole 40 MG tablet  Commonly known as:  PROTONIX  1 PO 30 MINS PRIOR TO YOUR FIRST MEAL     spironolactone 25 MG tablet  Commonly known as:  ALDACTONE  Take 25 mg by mouth every morning.     warfarin 5 MG tablet  Commonly known as:  COUMADIN  Take 0.5-1 tablets (2.5-5 mg total) by mouth every evening. Take one 1/2 tablet (2.5mg ) everyday except Monday, take one whole tablet (5mg )        Allergies:  Allergies  Allergen Reactions  . Wheat Swelling  . Latex Rash  . Penicillins Rash  . Sulfa Antibiotics Rash    Past Medical History  Diagnosis Date  . 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 Diabetes mellitus     insulin treated; Glucose-257 in 01/2012   . Congenital third degree heart block     Guidant VVI pacemaker implanted in 09/1997; 8generator change in 01/2004   . Chronic atrial fibrillation     embolic rind  . Cardiomyopathy     with congestive heart failure   ejection fraction of 30% in 09/2003  . Chronic anticoagulation   . Helicobacter pylori gastritis 08/14/2011  . Chronic kidney disease     creatinine- 1.8 in 09/2006 and 2.0 in 05/2007; 2.05 in 2011, 2.29 in 2012  . Dysphagia 08/14/2011    FEB 2013 EGD/DIL 16 MM; GERD; h/o gastroesophageal reflux disease; + H. Pylori gastritis   . Dizziness and giddiness 05/19/2012    Past Surgical History  Procedure Laterality Date  . Insert / replace / remove pacemaker  09/1997 w/gen. change in 01/2004    Guidant VVI [employer]  . Colonoscopy  04/23/2011    [id number] COLON POLYP/ INTERNAL HEMORRHOIDS/ TORTUOUS COLON  . Upper gastrointestinal endoscopy  FEB 2013    RING/DIL TO 16 MM, H PYLORI GASTRITIS    Family History  Problem Relation Age of Onset  . Adopted: Yes  . Colon cancer Neg Hx     Social History:  reports that she has never smoked. She has never used smokeless tobacco. She reports that she does not drink alcohol or use illicit drugs.    Review of Systems:   She has had stable chronic renal insufficiency, followed by nephrologist She has had cardiomyopathy and atrial fibrillation followed by cardiologist She had episodes of dizziness/vertigo and negative evaluation. No symptoms now  LABS:  No visits with results within 1 Week(s) from this visit. Latest known visit with results is:  Anti-coag visit on 01/13/2013  Component Date Value Range Status  . INR 01/13/2013 4.2   Final    Physical Examination:  BP 112/70  Pulse 60  Temp(Src) 98 F (36.7 C)  Resp 12  Ht 5\' 5"  (1.651 m)  Wt 189 lb 8 oz (85.957 kg)  BMI 31.53 kg/m2  SpO2 98%         ASSESSMENT:  Diabetes type 1:   Her blood sugars are dramatically better with using Victoza and she has responded to the lowest dose of 0.6 mg She did have tendency to mild hypoglycemia initially and diarrhea but this is not present now She is also having increased satiety without nausea and eating very small portions especially lunchtime POSTPRANDIAL readings are still somewhat high at times after supper but variable Her overall average at home is only 143   PLAN:   Will check her fructosamine to assess overall improvement She will increase her suppertime dose by 2 units, may need diabetic education to help adjust insulin based on carbohydrate intake Will reduce her insulin at lunchtime since she is eating relatively small nevus She may benefit from taking her Victoza in the evening rather than morning as she is somewhat anorexic during the day We'll reduce her nighttime Lantus and NPH since fasting readings are sometimes low normal and she is concerned about hypoglycemia She needs to check more readings after breakfast and lunch  which she has not done recently Discussed blood sugar targets Encouraged her to continue efforts to lose weight  Check lipid panel  Counseling time over 50% of today's 25 minute visit  Michael Ventresca 01/27/2013, 11:58 AM

## 2013-01-27 NOTE — Patient Instructions (Signed)
Do more sugars after lunch & breakfast  Reduce HUMULIN N AT BEDTIME TO 30 AND LANTUS TO 40  TAKE 18 NOVOLOG AT SUPPER.  REDUCE NOVOLOG to 8 at lunch  May take 0.6mg  Victoza in evening

## 2013-01-28 ENCOUNTER — Ambulatory Visit (INDEPENDENT_AMBULATORY_CARE_PROVIDER_SITE_OTHER): Payer: Medicaid Other | Admitting: *Deleted

## 2013-01-28 ENCOUNTER — Ambulatory Visit (HOSPITAL_COMMUNITY): Payer: Medicaid Other

## 2013-01-28 DIAGNOSIS — I4891 Unspecified atrial fibrillation: Secondary | ICD-10-CM

## 2013-01-28 LAB — POCT INR: INR: 2.3

## 2013-01-28 LAB — FRUCTOSAMINE: Fructosamine: 344 umol/L — ABNORMAL HIGH (ref <285)

## 2013-01-29 ENCOUNTER — Ambulatory Visit (HOSPITAL_COMMUNITY): Payer: Medicaid Other

## 2013-02-04 ENCOUNTER — Ambulatory Visit (HOSPITAL_COMMUNITY)
Admission: RE | Admit: 2013-02-04 | Discharge: 2013-02-04 | Disposition: A | Discharge status: Home / Self Care | Payer: Medicaid Other | Source: Ambulatory Visit | Attending: Obstetrics & Gynecology | Admitting: Obstetrics & Gynecology

## 2013-02-04 DIAGNOSIS — Z1231 Encounter for screening mammogram for malignant neoplasm of breast: Secondary | ICD-10-CM | POA: Insufficient documentation

## 2013-02-04 DIAGNOSIS — Z139 Encounter for screening, unspecified: Secondary | ICD-10-CM

## 2013-02-26 ENCOUNTER — Encounter (HOSPITAL_COMMUNITY): Payer: Self-pay | Admitting: Emergency Medicine

## 2013-02-26 ENCOUNTER — Emergency Department (HOSPITAL_COMMUNITY)
Admission: EM | Admit: 2013-02-26 | Discharge: 2013-02-26 | Disposition: A | Discharge status: Home / Self Care | Payer: Medicaid Other | Attending: Emergency Medicine | Admitting: Emergency Medicine

## 2013-02-26 DIAGNOSIS — Z8619 Personal history of other infectious and parasitic diseases: Secondary | ICD-10-CM | POA: Insufficient documentation

## 2013-02-26 DIAGNOSIS — Z8701 Personal history of pneumonia (recurrent): Secondary | ICD-10-CM | POA: Insufficient documentation

## 2013-02-26 DIAGNOSIS — Z880 Allergy status to penicillin: Secondary | ICD-10-CM | POA: Insufficient documentation

## 2013-02-26 DIAGNOSIS — Z91040 Latex allergy status: Secondary | ICD-10-CM | POA: Insufficient documentation

## 2013-02-26 DIAGNOSIS — M25579 Pain in unspecified ankle and joints of unspecified foot: Secondary | ICD-10-CM | POA: Insufficient documentation

## 2013-02-26 DIAGNOSIS — E119 Type 2 diabetes mellitus without complications: Secondary | ICD-10-CM | POA: Insufficient documentation

## 2013-02-26 DIAGNOSIS — Z79899 Other long term (current) drug therapy: Secondary | ICD-10-CM | POA: Insufficient documentation

## 2013-02-26 DIAGNOSIS — I4891 Unspecified atrial fibrillation: Secondary | ICD-10-CM | POA: Insufficient documentation

## 2013-02-26 DIAGNOSIS — Z7901 Long term (current) use of anticoagulants: Secondary | ICD-10-CM | POA: Insufficient documentation

## 2013-02-26 DIAGNOSIS — Z8774 Personal history of (corrected) congenital malformations of heart and circulatory system: Secondary | ICD-10-CM | POA: Insufficient documentation

## 2013-02-26 DIAGNOSIS — N189 Chronic kidney disease, unspecified: Secondary | ICD-10-CM | POA: Insufficient documentation

## 2013-02-26 DIAGNOSIS — Z794 Long term (current) use of insulin: Secondary | ICD-10-CM | POA: Insufficient documentation

## 2013-02-26 DIAGNOSIS — M79672 Pain in left foot: Secondary | ICD-10-CM

## 2013-02-26 MED ORDER — HYDROCODONE-ACETAMINOPHEN 5-325 MG PO TABS
1.0000 | ORAL_TABLET | Freq: Once | ORAL | Status: AC
  Administered 2013-02-26: 1 via ORAL
  Filled 2013-02-26: qty 1

## 2013-02-26 MED ORDER — HYDROCODONE-ACETAMINOPHEN 5-325 MG PO TABS: 1.0000 | ORAL_TABLET | Freq: Four times a day (QID) | ORAL | Status: DC | PRN

## 2013-02-26 NOTE — ED Provider Notes (Signed)
CSN: [id number]     Arrival date & time 02/26/13  1119 History   First MD Initiated Contact with Patient 02/26/13 1144     Chief Complaint  Patient presents with  . Foot Pain   (Consider location/radiation/quality/duration/timing/severity/associated sxs/prior Treatment) HPI Comments: Patient with left foot pain. She presents emergency department today with left foot pain x3 days. She denies any known mechanism of injury. She states that she has been wearing new boots which she got for Christmas almost continuously. She states that they're high heeled boots. She states that she has a history of gout. She is tried taking Tylenol with no relief of her symptoms. She states that she has some pain in her upper leg, but believes this to be from walking with an antalgic gait for the past couple of days. She denies fevers chills. Denies any swelling.  The history is provided by the patient. No language interpreter was used.    Past Medical History  Diagnosis Date  . 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 Diabetes mellitus     insulin treated; Glucose-257 in 01/2012   . Congenital third degree heart block     Guidant VVI pacemaker implanted in 09/1997; 8generator change in 01/2004   . Chronic atrial fibrillation     embolic rind  . Cardiomyopathy     with congestive heart failure   ejection fraction of 30% in 09/2003  . Chronic anticoagulation   . Helicobacter pylori gastritis 08/14/2011  . Chronic kidney disease     creatinine- 1.8 in 09/2006 and 2.0 in 05/2007; 2.05 in 2011, 2.29 in 2012  . Dysphagia 08/14/2011    FEB 2013 EGD/DIL 16 MM; GERD; h/o gastroesophageal reflux disease; + H. Pylori gastritis   . Dizziness and giddiness 05/19/2012   Past Surgical History  Procedure Laterality Date  . Insert / replace / remove pacemaker  09/1997 w/gen. change in 01/2004    Guidant VVI [employer]  . Colonoscopy  04/23/2011    [id number] COLON POLYP/ INTERNAL  HEMORRHOIDS/ TORTUOUS COLON  . Upper gastrointestinal endoscopy  FEB 2013    RING/DIL TO 16 MM, H PYLORI GASTRITIS   Family History  Problem Relation Age of Onset  . Adopted: Yes  . Colon cancer Neg Hx    History  Substance Use Topics  . Smoking status: Never Smoker   . Smokeless tobacco: Never Used  . Alcohol Use: No   OB History   Grav Para Term Preterm Abortions TAB SAB Ect Mult Living                 Review of Systems  All other systems reviewed and are negative.    Allergies  Wheat; Latex; Penicillins; and Sulfa antibiotics  Home Medications   Current Outpatient Rx  Name  Route  Sig  Dispense  Refill  . acetaminophen (TYLENOL) 500 MG tablet   Oral   Take 500 mg by mouth every 6 (six) hours as needed for moderate pain.         Marland Kitchen allopurinol (ZYLOPRIM) 100 MG tablet   Oral   Take 150 mg by mouth every morning.          . benazepril (LOTENSIN) 20 MG tablet   Oral   Take 10 mg by mouth every morning. Takes 1/2 tablet         . carvedilol (COREG CR) 40 MG 24 hr capsule   Oral   Take 1 capsule (40 mg total)  by mouth daily.   30 capsule   11   . furosemide (LASIX) 80 MG tablet   Oral   Take 80 mg by mouth daily.         . insulin aspart (NOVOLOG) 100 UNIT/ML injection   Subcutaneous   Inject 12-20 Units into the skin 3 (three) times daily. Inject 12 units at 8:00am, 16 units at lunch and 20 units at suppertime         . insulin glargine (LANTUS) 100 UNIT/ML injection   Subcutaneous   Inject 0.3-0.4 mLs (30-40 Units total) into the skin 2 (two) times daily. Inject 40 units in am and 30 units at bedtime   5 pen   5   . insulin NPH (HUMULIN N,NOVOLIN N) 100 UNIT/ML injection   Subcutaneous   Inject 34 Units into the skin at bedtime.          Marland Kitchen LANOXIN 125 MCG tablet      TAKE 1 TABLET BY MOUTH ON MONDAY THROUGH FRIDAY AS DIRECTED.   20 tablet   3   . Liraglutide (VICTOZA) 18 MG/3ML SOPN   Subcutaneous   Inject 0.6 mg into the skin as  directed.   2 pen   5   . pantoprazole (PROTONIX) 40 MG tablet      1 PO 30 MINS PRIOR TO YOUR FIRST MEAL   30 tablet   11   . spironolactone (ALDACTONE) 25 MG tablet   Oral   Take 25 mg by mouth every morning.          . warfarin (COUMADIN) 5 MG tablet   Oral   Take 0.5-1 tablets (2.5-5 mg total) by mouth every evening. Take one 1/2 tablet (2.5mg ) everyday except Monday, take one whole tablet (5mg )   30 tablet   3   . COLCRYS 0.6 MG tablet   Oral   Take 0.6 mg by mouth daily as needed. For GOUT FLARE         . HYDROcodone-acetaminophen (NORCO/VICODIN) 5-325 MG per tablet   Oral   Take 1-2 tablets by mouth every 6 (six) hours as needed.   13 tablet   0   . Insulin Pen Needle (NOVOTWIST) 32G X 5 MM MISC   Does not apply   1 each by Does not apply route daily.   50 each   5    BP 101/58  Pulse 54  Temp(Src) 98.1 F (36.7 C) (Oral)  Resp 20  Ht 5\' 6"  (1.676 m)  Wt 180 lb (81.647 kg)  BMI 29.07 kg/m2  SpO2 99% Physical Exam  Nursing note and vitals reviewed. Constitutional: She is oriented to person, place, and time. She appears well-developed and well-nourished.  HENT:  Head: Normocephalic and atraumatic.  Eyes: Conjunctivae and EOM are normal.  Neck: Normal range of motion.  Cardiovascular: Normal rate and intact distal pulses.   Intact distal pulses with brisk capillary refill  Pulmonary/Chest: Effort normal.  Abdominal: She exhibits no distension.  Musculoskeletal: Normal range of motion.  Compression of left forefoot is positive for pain, no bony tenderness throughout relative, range of motion and strength is 5/5, no evidence of erythema, or cellulitis, no unilateral leg swelling, or calf tenderness, no evidence of DVT  Neurological: She is alert and oriented to person, place, and time.  Skin: Skin is dry.  Psychiatric: She has a normal mood and affect. Her behavior is normal. Judgment and thought content normal.    ED Course  Procedures  (  including critical care time) Labs Review Labs Reviewed - No data to display Imaging Review No results found.  EKG Interpretation   None       MDM   1. Foot pain, left    Patient with foot pain. I suspect the patient is likely having an overuse injury from wearing new boots almost continuously since Christmas. I am mildly suspicious for a Morton's neuroma, or less likely gout as she did have positive forefoot compression test, there is no bony tenderness to suggest fracture, also no mechanism of injury. There is no evidence of cellulitis or erythema, and no evidence of DVT. I will discharge her with a postop shoe, crutches, rice therapy, and some pain medicine. Instructions will be given followup with her foot and ankle specialist if the symptoms persist. Patient is stable and ready for discharge.    Montine Circle, PA-C 02/26/13 1254

## 2013-02-26 NOTE — ED Notes (Signed)
Lt foot sl red and warm, Good dp pulse, Pt thinks is due to gout. Says her CBG's have been good. No injury.

## 2013-02-26 NOTE — Discharge Instructions (Signed)
Gout °Gout is an inflammatory arthritis caused by a buildup of uric acid crystals in the joints. Uric acid is a chemical that is normally present in the blood. When the level of uric acid in the blood is too high it can form crystals that deposit in your joints and tissues. This causes joint redness, soreness, and swelling (inflammation). Repeat attacks are common. Over time, uric acid crystals can form into masses (tophi) near a joint, destroying bone and causing disfigurement. Gout is treatable and often preventable. °CAUSES  °The disease begins with elevated levels of uric acid in the blood. Uric acid is produced by your body when it breaks down a naturally found substance called purines. Certain foods you eat, such as meats and fish, contain high amounts of purines. Causes of an elevated uric acid level include: °· Being passed down from parent to child (heredity). °· Diseases that cause increased uric acid production (such as obesity, psoriasis, and certain cancers). °· Excessive alcohol use. °· Diet, especially diets rich in meat and seafood. °· Medicines, including certain cancer-fighting medicines (chemotherapy), water pills (diuretics), and aspirin. °· Chronic kidney disease. The kidneys are no longer able to remove uric acid well. °· Problems with metabolism. °Conditions strongly associated with gout include: °· Obesity. °· High blood pressure. °· High cholesterol. °· Diabetes. °Not everyone with elevated uric acid levels gets gout. It is not understood why some people get gout and others do not. Surgery, joint injury, and eating too much of certain foods are some of the factors that can lead to gout attacks. °SYMPTOMS  °· An attack of gout comes on quickly. It causes intense pain with redness, swelling, and warmth in a joint. °· Fever can occur. °· Often, only one joint is involved. Certain joints are more commonly involved: °· Base of the big toe. °· Knee. °· Ankle. °· Wrist. °· Finger. °Without  treatment, an attack usually goes away in a few days to weeks. Between attacks, you usually will not have symptoms, which is different from many other forms of arthritis. °DIAGNOSIS  °Your caregiver will suspect gout based on your symptoms and exam. In some cases, tests may be recommended. The tests may include: °· Blood tests. °· Urine tests. °· X-rays. °· Joint fluid exam. This exam requires a needle to remove fluid from the joint (arthrocentesis). Using a microscope, gout is confirmed when uric acid crystals are seen in the joint fluid. °TREATMENT  °There are two phases to gout treatment: treating the sudden onset (acute) attack and preventing attacks (prophylaxis). °· Treatment of an Acute Attack. °· Medicines are used. These include anti-inflammatory medicines or steroid medicines. °· An injection of steroid medicine into the affected joint is sometimes necessary. °· The painful joint is rested. Movement can worsen the arthritis. °· You may use warm or cold treatments on painful joints, depending which works best for you. °· Treatment to Prevent Attacks. °· If you suffer from frequent gout attacks, your caregiver may advise preventive medicine. These medicines are started after the acute attack subsides. These medicines either help your kidneys eliminate uric acid from your body or decrease your uric acid production. You may need to stay on these medicines for a very long time. °· The early phase of treatment with preventive medicine can be associated with an increase in acute gout attacks. For this reason, during the first few months of treatment, your caregiver may also advise you to take medicines usually used for acute gout treatment. Be sure you   understand your caregiver's directions. Your caregiver may make several adjustments to your medicine dose before these medicines are effective.  Discuss dietary treatment with your caregiver or dietitian. Alcohol and drinks high in sugar and fructose and foods  such as meat, poultry, and seafood can increase uric acid levels. Your caregiver or dietician can advise you on drinks and foods that should be limited. HOME CARE INSTRUCTIONS   Do not take aspirin to relieve pain. This raises uric acid levels.  Only take over-the-counter or prescription medicines for pain, discomfort, or fever as directed by your caregiver.  Rest the joint as much as possible. When in bed, keep sheets and blankets off painful areas.  Keep the affected joint raised (elevated).  Apply warm or cold treatments to painful joints. Use of warm or cold treatments depends on which works best for you.  Use crutches if the painful joint is in your leg.  Drink enough fluids to keep your urine clear or pale yellow. This helps your body get rid of uric acid. Limit alcohol, sugary drinks, and fructose drinks.  Follow your dietary instructions. Pay careful attention to the amount of protein you eat. Your daily diet should emphasize fruits, vegetables, whole grains, and fat-free or low-fat milk products. Discuss the use of coffee, vitamin C, and cherries with your caregiver or dietician. These may be helpful in lowering uric acid levels.  Maintain a healthy body weight. SEEK MEDICAL CARE IF:   You develop diarrhea, vomiting, or any side effects from medicines.  You do not feel better in 24 hours, or you are getting worse. SEEK IMMEDIATE MEDICAL CARE IF:   Your joint becomes suddenly more tender, and you have chills or a fever. MAKE SURE YOU:   Understand these instructions.  Will watch your condition.  Will get help right away if you are not doing well or get worse. Document Released: 02/09/2000 Document Revised: 06/08/2012 Document Reviewed: 09/25/2011 [facility] Patient Information 2014 [place]. Morton's Neuroma Neuralgia (nerve pain) or neuroma (benign [non-cancerous] nerve tumor) may develop on any interdigital nerve. The interdigital nerves (nerves between digits) of  the foot travel beneath and between the metatarsals (long bones of the fore foot) and pass the nerve endings to the toes. The third interdigital is a common place for a small neuroma to form called Morton's neuroma. Another nerve to be affected commonly is the fourth interdigital nerve. This would be in approximately in the area of the base or ball under the bottom of your fourth toe. This condition occurs more commonly in women and is usually on one side. It is usually first noticed by pain radiating (spreading) to the ball of the foot or to the toes. CAUSES The cause of interdigital neuralgia may be from low grade repetitive trauma (damage caused by an accident) as in activities causing a repeated pounding of the foot (running, jumping etc.). It is also caused by improper footwear or recent loss of the fatty padding on the bottom of the foot. TREATMENT  The condition often resolves (goes away) simply with decreasing activity if that is thought to be the cause. Proper shoes are beneficial. Orthotics (special foot support aids) such as a metatarsal bar are often beneficial. This condition usually responds to conservative therapy, however if surgery is necessary it usually brings complete relief. HOME CARE INSTRUCTIONS   Apply ice to the area of soreness for 15-20 minutes, 03-04 times per day, while awake for the first 2 days. Put ice in a plastic bag  and place a towel between the bag of ice and your skin.  Only take over-the-counter or prescription medicines for pain, discomfort, or fever as directed by your caregiver. MAKE SURE YOU:   Understand these instructions.  Will watch your condition.  Will get help right away if you are not doing well or get worse. Document Released: 05/20/2000 Document Revised: 05/06/2011 Document Reviewed: 02/11/2005 [facility] Patient Information 2014 [place].

## 2013-02-26 NOTE — ED Provider Notes (Signed)
Medical screening examination/treatment/procedure(s) were performed by non-physician practitioner and as supervising physician I was immediately available for consultation/collaboration.  EKG Interpretation   None        Nat Christen, MD 02/26/13 1355

## 2013-02-26 NOTE — ED Notes (Signed)
Pt c/o pain in top of left foot since Wednesday.  Denies injury.

## 2013-03-01 ENCOUNTER — Ambulatory Visit (INDEPENDENT_AMBULATORY_CARE_PROVIDER_SITE_OTHER): Payer: Medicaid Other | Admitting: *Deleted

## 2013-03-01 DIAGNOSIS — I4891 Unspecified atrial fibrillation: Secondary | ICD-10-CM

## 2013-03-01 LAB — POCT INR: INR: 2.6
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
## 2013-03-04 ENCOUNTER — Encounter: Payer: BC Managed Care – PPO | Admitting: Internal Medicine

## 2013-03-17 ENCOUNTER — Encounter: Payer: Self-pay | Admitting: *Deleted

## 2013-03-17 ENCOUNTER — Ambulatory Visit: Payer: Medicaid Other | Admitting: Cardiovascular Disease

## 2013-03-18 ENCOUNTER — Other Ambulatory Visit (INDEPENDENT_AMBULATORY_CARE_PROVIDER_SITE_OTHER): Payer: Medicaid Other

## 2013-03-18 DIAGNOSIS — E1129 Type 2 diabetes mellitus with other diabetic kidney complication: Secondary | ICD-10-CM

## 2013-03-18 DIAGNOSIS — E1165 Type 2 diabetes mellitus with hyperglycemia: Principal | ICD-10-CM

## 2013-03-18 LAB — MICROALBUMIN / CREATININE URINE RATIO
Creatinine,U: 168.4 mg/dL
MICROALB UR: 2.4 mg/dL — AB (ref 0.0–1.9)
Microalb Creat Ratio: 1.4 mg/g (ref 0.0–30.0)

## 2013-03-18 LAB — HEMOGLOBIN A1C: Hgb A1c MFr Bld: 8.5 % — ABNORMAL HIGH (ref 4.6–6.5)

## 2013-03-19 LAB — GLUCOSE, RANDOM: GLUCOSE: 157 mg/dL — AB (ref 70–99)

## 2013-03-22 ENCOUNTER — Ambulatory Visit: Payer: Medicaid Other | Admitting: Cardiovascular Disease

## 2013-03-22 ENCOUNTER — Encounter: Payer: Self-pay | Admitting: Cardiovascular Disease

## 2013-03-23 ENCOUNTER — Ambulatory Visit: Payer: Medicaid Other | Admitting: Endocrinology

## 2013-03-25 ENCOUNTER — Ambulatory Visit: Payer: Medicaid Other | Admitting: Endocrinology

## 2013-03-25 ENCOUNTER — Other Ambulatory Visit: Payer: Self-pay | Admitting: Cardiology

## 2013-03-25 ENCOUNTER — Other Ambulatory Visit: Payer: Self-pay | Admitting: Endocrinology

## 2013-03-25 ENCOUNTER — Other Ambulatory Visit: Payer: Self-pay | Admitting: Cardiovascular Disease

## 2013-03-26 ENCOUNTER — Telehealth: Payer: Self-pay | Admitting: *Deleted

## 2013-03-26 ENCOUNTER — Telehealth: Payer: Self-pay | Admitting: Cardiovascular Disease

## 2013-03-26 MED ORDER — WARFARIN SODIUM 5 MG PO TABS: ORAL_TABLET | ORAL | Status: DC

## 2013-03-26 NOTE — Telephone Encounter (Signed)
Received fax refill request  Rx # [id number] Medication:  Coumadin 5 mg tablet Qty 17 tab Sig:  Take 1/2 tablet by mouth daily except take 1 tablet on Monday Physician:  Purcell Nails

## 2013-03-26 NOTE — Telephone Encounter (Signed)
Please see prior authorization request in refill bin / tgs

## 2013-03-29 ENCOUNTER — Ambulatory Visit (INDEPENDENT_AMBULATORY_CARE_PROVIDER_SITE_OTHER): Payer: Medicaid Other | Admitting: *Deleted

## 2013-03-29 DIAGNOSIS — Z7189 Other specified counseling: Secondary | ICD-10-CM | POA: Insufficient documentation

## 2013-03-29 DIAGNOSIS — Z5181 Encounter for therapeutic drug level monitoring: Secondary | ICD-10-CM

## 2013-03-29 DIAGNOSIS — I4891 Unspecified atrial fibrillation: Secondary | ICD-10-CM

## 2013-03-29 LAB — POCT INR: INR: 2.5

## 2013-03-31 ENCOUNTER — Ambulatory Visit: Payer: Medicaid Other | Admitting: Endocrinology

## 2013-04-01 ENCOUNTER — Other Ambulatory Visit: Payer: Self-pay | Admitting: Endocrinology

## 2013-04-02 ENCOUNTER — Other Ambulatory Visit (HOSPITAL_COMMUNITY)
Admission: RE | Admit: 2013-04-02 | Discharge: 2013-04-02 | Disposition: A | Discharge status: Home / Self Care | Payer: Medicaid Other | Source: Ambulatory Visit | Attending: Obstetrics & Gynecology | Admitting: Obstetrics & Gynecology

## 2013-04-02 ENCOUNTER — Ambulatory Visit (INDEPENDENT_AMBULATORY_CARE_PROVIDER_SITE_OTHER): Payer: Medicaid Other | Admitting: Obstetrics & Gynecology

## 2013-04-02 ENCOUNTER — Encounter: Payer: Self-pay | Admitting: Obstetrics & Gynecology

## 2013-04-02 VITALS — BP 100/70 | Ht 63.3 in | Wt 186.4 lb

## 2013-04-02 DIAGNOSIS — Z0000 Encounter for general adult medical examination without abnormal findings: Secondary | ICD-10-CM

## 2013-04-02 DIAGNOSIS — Z1151 Encounter for screening for human papillomavirus (HPV): Secondary | ICD-10-CM | POA: Insufficient documentation

## 2013-04-02 DIAGNOSIS — Z01419 Encounter for gynecological examination (general) (routine) without abnormal findings: Secondary | ICD-10-CM | POA: Insufficient documentation

## 2013-04-02 NOTE — Progress Notes (Signed)
Patient ID: [name], female   DOB: [birth year], 53 y.o.   MRN: [id number] Subjective:     [name] is a 53 y.o. female here for a routine exam.  No LMP recorded. Patient is postmenopausal. No obstetric history on file. Birth Control Method:  Post menopausal Menstrual Calendar(currently): years ago   Current complaints: no gyn complaints.   Current acute medical issues:  multiple   Recent Gynecologic History No LMP recorded. Patient is postmenopausal. Last Pap: 2013,  normal Last mammogram: 2014,  normal  Past Medical History  Diagnosis Date  . 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 Diabetes mellitus     insulin treated; Glucose-257 in 01/2012   . Congenital third degree heart block     Guidant VVI pacemaker implanted in 09/1997; 8generator change in 01/2004   . Chronic atrial fibrillation     embolic rind  . Cardiomyopathy     with congestive heart failure   ejection fraction of 30% in 09/2003  . Chronic anticoagulation   . Helicobacter pylori gastritis 08/14/2011  . Chronic kidney disease     creatinine- 1.8 in 09/2006 and 2.0 in 05/2007; 2.05 in 2011, 2.29 in 2012  . Dysphagia 08/14/2011    FEB 2013 EGD/DIL 16 MM; GERD; h/o gastroesophageal reflux disease; + H. Pylori gastritis   . Dizziness and giddiness 05/19/2012    Past Surgical History  Procedure Laterality Date  . Insert / replace / remove pacemaker  09/1997 w/gen. change in 01/2004    Guidant VVI [employer]  . Colonoscopy  04/23/2011    [id number] COLON POLYP/ INTERNAL HEMORRHOIDS/ TORTUOUS COLON  . Upper gastrointestinal endoscopy  FEB 2013    RING/DIL TO 16 MM, H PYLORI GASTRITIS    OB History   Grav Para Term Preterm Abortions TAB SAB Ect Mult Living                  History   Social History  . Marital Status: Married    Spouse Name: N/A    Number of Children: 4  . Years of Education: N/A   Occupational History  .     Social History Main Topics  .  Smoking status: Never Smoker   . Smokeless tobacco: Never Used  . Alcohol Use: No  . Drug Use: No  . Sexual Activity: None   Other Topics Concern  . None   Social History Narrative  . None    Family History  Problem Relation Age of Onset  . Adopted: Yes  . Colon cancer Neg Hx      Review of Systems  Review of Systems  Constitutional: Negative for fever, chills, weight loss, malaise/fatigue and diaphoresis.  HENT: Negative for hearing loss, ear pain, nosebleeds, congestion, sore throat, neck pain, tinnitus and ear discharge.   Eyes: Negative for blurred vision, double vision, photophobia, pain, discharge and redness.  Respiratory: Negative for cough, hemoptysis, sputum production, shortness of breath, wheezing and stridor.   Cardiovascular: Negative for chest pain, palpitations, orthopnea, claudication, leg swelling and PND.  Gastrointestinal: negative for abdominal pain. Negative for heartburn, nausea, vomiting, diarrhea, constipation, blood in stool and melena.  Genitourinary: Negative for dysuria, urgency, frequency, hematuria and flank pain.  Musculoskeletal: Negative for myalgias, back pain, joint pain and falls.  Skin: Negative for itching and rash.  Neurological: Negative for dizziness, tingling, tremors, sensory change, speech change, focal weakness, seizures, loss of consciousness, weakness and headaches.  Endo/Heme/Allergies: Negative for environmental  allergies and polydipsia. Does not bruise/bleed easily.  Psychiatric/Behavioral: Negative for depression, suicidal ideas, hallucinations, memory loss and substance abuse. The patient is not nervous/anxious and does not have insomnia.        Objective:    Physical Exam  Vitals reviewed. Constitutional: She is oriented to person, place, and time. She appears well-developed and well-nourished.  HENT:  Head: Normocephalic and atraumatic.        Right Ear: External ear normal.  Left Ear: External ear normal.  Nose:  Nose normal.  Mouth/Throat: Oropharynx is clear and moist.  Eyes: Conjunctivae and EOM are normal. Pupils are equal, round, and reactive to light. Right eye exhibits no discharge. Left eye exhibits no discharge. No scleral icterus.  Neck: Normal range of motion. Neck supple. No tracheal deviation present. No thyromegaly present.  Cardiovascular: Normal rate, regular rhythm, normal heart sounds and intact distal pulses.  Exam reveals no gallop and no friction rub.   No murmur heard. Respiratory: Effort normal and breath sounds normal. No respiratory distress. She has no wheezes. She has no rales. She exhibits no tenderness.  GI: Soft. Bowel sounds are normal. She exhibits no distension and no mass. There is no tenderness. There is no rebound and no guarding.  Genitourinary:  Breasts no masses skin changes or nipple changes bilaterally      Vulva is normal without lesions Vagina is pink moist without discharge Cervix normal in appearance and pap is done Uterus is normal size shape and contour Adnexa is negative with normal sized ovaries  Rectal    hemoccult negative, normal tone, no masses  Musculoskeletal: Normal range of motion. She exhibits no edema and no tenderness.  Neurological: She is alert and oriented to person, place, and time. She has normal reflexes. She displays normal reflexes. No cranial nerve deficit. She exhibits normal muscle tone. Coordination normal.  Skin: Skin is warm and dry. No rash noted. No erythema. No pallor.  Psychiatric: She has a normal mood and affect. Her behavior is normal. Judgment and thought content normal.       Assessment:    Healthy female exam.    Plan:    Follow up in: 1 year.

## 2013-04-05 NOTE — Telephone Encounter (Signed)
Pt called needing this morning, she has been out since friday

## 2013-04-06 ENCOUNTER — Telehealth: Payer: Self-pay | Admitting: *Deleted

## 2013-04-06 NOTE — Telephone Encounter (Signed)
Called prior authorization. [phone] stated they have sent to pharmacy to be reviewed. Ref # [id number]

## 2013-04-06 NOTE — Telephone Encounter (Signed)
Called pt to make her aware that we are waiting for prior authorization. We have sent everything that the prior Auth needed.

## 2013-04-07 ENCOUNTER — Encounter: Payer: Self-pay | Admitting: *Deleted

## 2013-04-07 ENCOUNTER — Encounter: Payer: Self-pay | Admitting: Internal Medicine

## 2013-04-07 ENCOUNTER — Ambulatory Visit (INDEPENDENT_AMBULATORY_CARE_PROVIDER_SITE_OTHER): Payer: Medicaid Other | Admitting: Internal Medicine

## 2013-04-07 VITALS — BP 137/79 | HR 66 | Ht 66.0 in | Wt 185.0 lb

## 2013-04-07 DIAGNOSIS — Z01818 Encounter for other preprocedural examination: Secondary | ICD-10-CM

## 2013-04-07 DIAGNOSIS — I5022 Chronic systolic (congestive) heart failure: Secondary | ICD-10-CM | POA: Insufficient documentation

## 2013-04-07 DIAGNOSIS — I4891 Unspecified atrial fibrillation: Secondary | ICD-10-CM

## 2013-04-07 DIAGNOSIS — I509 Heart failure, unspecified: Secondary | ICD-10-CM

## 2013-04-07 LAB — MDC_IDC_ENUM_SESS_TYPE_INCLINIC
Battery Remaining Longevity: 12 mo
Brady Statistic RV Percent Paced: 100 %
Implantable Pulse Generator Serial Number: 119679
Lead Channel Impedance Value: 350 Ohm
Lead Channel Pacing Threshold Pulse Width: 0.5 ms
Lead Channel Setting Pacing Pulse Width: 0.2 ms
Lead Channel Setting Sensing Sensitivity: 2.5 mV
MDC IDC MSMT LEADCHNL RV PACING THRESHOLD AMPLITUDE: 0.6 V
MDC IDC MSMT LEADCHNL RV SENSING INTR AMPL: 9.6 mV
MDC IDC SET LEADCHNL RV PACING AMPLITUDE: 2.5 V

## 2013-04-07 NOTE — Assessment & Plan Note (Signed)
Her heart failure symptoms remain class 2. I have discussed the indication for upgrade on her device. She has pacing induced LBBB and a QRS duration of 150 ms. Will plan for Biv device upgrade. She has not recently had an echo and will repeat to see if she needs a BiV ICD or a BiV PPM.

## 2013-04-07 NOTE — Progress Notes (Signed)
HPI [name] returns today for followup. She is a very pleasant 53 year old woman with congenital complete heart block, chronic atrial fibrillation, who also has left ventricular dysfunction. For years, the patient has been class I. She developed worsening heart failure symptoms several months ago, and required up titration of her medical therapy. Her ejection fraction was 20% by echo in 2013. Since her up titration in treatment, her heart failure symptoms have improved. She is now class IIA. The patient has been struggling with diabetes. Her hemoglobin A1c was 11 and most recently 8.5. She has been under adjustment with her diabetic medications. She admits to some dietary indiscretion. Her husband states that she eats less than she used to. She has developed noise on her RV pacing lead. She has not had syncope. Allergies  Allergen Reactions  . Wheat Swelling  . Latex Rash  . Penicillins Rash  . Sulfa Antibiotics Rash     Current Outpatient Prescriptions  Medication Sig Dispense Refill  . acetaminophen (TYLENOL) 500 MG tablet Take 500 mg by mouth every 6 (six) hours as needed for moderate pain.      Marland Kitchen allopurinol (ZYLOPRIM) 100 MG tablet Take 150 mg by mouth every morning.       . benazepril (LOTENSIN) 20 MG tablet Take 10 mg by mouth every morning. Takes 1/2 tablet      . COLCRYS 0.6 MG tablet Take 0.6 mg by mouth daily as needed. For GOUT FLARE      . COREG CR 40 MG 24 hr capsule TAKE ONE CAPSULE BY MOUTH DAILY.  30 capsule  3  . furosemide (LASIX) 80 MG tablet Take 80 mg by mouth daily.      Marland Kitchen HUMULIN N KWIKPEN 100 UNIT/ML Kiwkpen INJECT 34 UNITS SUBCUTANEOUSLY AT BEDTIME.  15 mL  0  . HYDROcodone-acetaminophen (NORCO/VICODIN) 5-325 MG per tablet Take 1-2 tablets by mouth every 6 (six) hours as needed.  13 tablet  0  . insulin aspart (NOVOLOG) 100 UNIT/ML injection Inject 12-20 Units into the skin 3 (three) times daily. Inject 12 units at 8:00am, 16 units at lunch and 20 units  at suppertime      . insulin glargine (LANTUS) 100 UNIT/ML injection Inject 0.3-0.4 mLs (30-40 Units total) into the skin 2 (two) times daily. Inject 40 units in am and 30 units at bedtime  5 pen  5  . insulin NPH (HUMULIN N,NOVOLIN N) 100 UNIT/ML injection Inject 34 Units into the skin at bedtime.       . Insulin Pen Needle (NOVOTWIST) 32G X 5 MM MISC 1 each by Does not apply route daily.  50 each  5  . LANOXIN 125 MCG tablet TAKE 1 TABLET BY MOUTH ON MONDAY THROUGH FRIDAY AS DIRECTED.  20 tablet  1  . Liraglutide (VICTOZA) 18 MG/3ML SOPN Inject 0.6 mg into the skin as directed.  2 pen  5  . NOVOLOG FLEXPEN 100 UNIT/ML FlexPen INJECT SUBCUTANEOUSLY 12 UNITS BEFORE BREAKFAST; 16 UNITS AT LUNCH; AND 20 UNITS AT DINNER.  15 mL  1  . pantoprazole (PROTONIX) 40 MG tablet 1 PO 30 MINS PRIOR TO YOUR FIRST MEAL  30 tablet  11  . spironolactone (ALDACTONE) 25 MG tablet Take 25 mg by mouth every morning.       . warfarin (COUMADIN) 5 MG tablet Take one 1/2 tablet (2.5mg ) everyday except Monday and Thursdays take one whole tablet (5mg )  30 tablet  6   No current facility-administered medications  for this visit.     Past Medical History  Diagnosis Date  . 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 Diabetes mellitus     insulin treated; Glucose-257 in 01/2012   . Congenital third degree heart block     Guidant VVI pacemaker implanted in 09/1997; 8generator change in 01/2004   . Chronic atrial fibrillation     embolic rind  . Cardiomyopathy     with congestive heart failure   ejection fraction of 30% in 09/2003  . Chronic anticoagulation   . Helicobacter pylori gastritis 08/14/2011  . Chronic kidney disease     creatinine- 1.8 in 09/2006 and 2.0 in 05/2007; 2.05 in 2011, 2.29 in 2012  . Dysphagia 08/14/2011    FEB 2013 EGD/DIL 16 MM; GERD; h/o gastroesophageal reflux disease; + H. Pylori gastritis   . Dizziness and giddiness 05/19/2012    ROS:   All systems reviewed and  negative except as noted in the HPI.   Past Surgical History  Procedure Laterality Date  . Insert / replace / remove pacemaker  09/1997 w/gen. change in 01/2004    Guidant VVI [employer]  . Colonoscopy  04/23/2011    [id number] COLON POLYP/ INTERNAL HEMORRHOIDS/ TORTUOUS COLON  . Upper gastrointestinal endoscopy  FEB 2013    RING/DIL TO 16 MM, H PYLORI GASTRITIS     Family History  Problem Relation Age of Onset  . Adopted: Yes  . Colon cancer Neg Hx      History   Social History  . Marital Status: Married    Spouse Name: N/A    Number of Children: 4  . Years of Education: N/A   Occupational History  .     Social History Main Topics  . Smoking status: Never Smoker   . Smokeless tobacco: Never Used  . Alcohol Use: No  . Drug Use: No  . Sexual Activity: Not on file   Other Topics Concern  . Not on file   Social History Narrative  . No narrative on file     BP 137/79  Pulse 66  Ht 5\' 6"  (1.676 m)  Wt 185 lb (83.915 kg)  BMI 29.87 kg/m2  Physical Exam:  Well appearing 53 year old woman,NAD HEENT: Unremarkable Neck:  No JVD, no thyromegally Back:  No CVA tenderness Lungs:  Clear with minimal rales in the bases bilaterally. No wheezes or rhonchi. No increased work of breathing. HEART:  Regular rate rhythm, no murmurs, no rubs, no clicks Abd:  soft, obese,positive bowel sounds, no organomegally, no rebound, no guarding Ext:  2 plus pulses, no edema, no cyanosis, no clubbing Skin:  No rashes no nodules Neuro:  CN II through XII intact, motor grossly intact  DEVICE  Normal device function except for noise on her RV lead.  See PaceArt for details.   Assess/Plan:

## 2013-04-07 NOTE — Patient Instructions (Signed)
Your physician recommends that you schedule a follow-up appointment in: 4 months with Dr Lovena Le Dennis Bast will receive a reminder letter two months in advance reminding you to call and schedule your appointment. If you don't receive this letter, please contact our office.  Your physician has recommended that you have a pacemaker inserted. A pacemaker is a small device that is placed under the skin of your chest or abdomen to help control abnormal heart rhythms. This device uses electrical pulses to prompt the heart to beat at a normal rate. Pacemakers are used to treat heart rhythms that are too slow. Wire (leads) are attached to the pacemaker that goes into the chambers of you heart. This is done in the hospital and usually requires and overnight stay. Please see the instruction sheet given to you today for more information.  Your physician recommends that you return for lab work 2 days before procedure. These will be STAT and sent to hospital.  Your physician has requested that you have an echocardiogram. Echocardiography is a painless test that uses sound waves to create images of your heart. It provides your doctor with information about the size and shape of your heart and how well your heart's chambers and valves are working. This procedure takes approximately one hour. There are no restrictions for this procedure.

## 2013-04-07 NOTE — Assessment & Plan Note (Signed)
Her ventricular rate is well controlled. No change in medical therapy.

## 2013-04-07 NOTE — Assessment & Plan Note (Signed)
Her [employer] device has noise on the RV lead. Will plan lead revision.

## 2013-04-08 ENCOUNTER — Ambulatory Visit (INDEPENDENT_AMBULATORY_CARE_PROVIDER_SITE_OTHER): Payer: Medicaid Other | Admitting: Endocrinology

## 2013-04-08 ENCOUNTER — Encounter: Payer: Self-pay | Admitting: Endocrinology

## 2013-04-08 VITALS — BP 118/78 | HR 59 | Temp 97.9°F | Resp 14 | Ht 65.0 in | Wt 184.8 lb

## 2013-04-08 DIAGNOSIS — E785 Hyperlipidemia, unspecified: Secondary | ICD-10-CM

## 2013-04-08 DIAGNOSIS — N184 Chronic kidney disease, stage 4 (severe): Secondary | ICD-10-CM

## 2013-04-08 DIAGNOSIS — E1165 Type 2 diabetes mellitus with hyperglycemia: Principal | ICD-10-CM

## 2013-04-08 DIAGNOSIS — E1129 Type 2 diabetes mellitus with other diabetic kidney complication: Secondary | ICD-10-CM

## 2013-04-08 NOTE — Patient Instructions (Signed)
Lantus a.m. 34 in am - 30 in PM.  Humulin N 30 hs, NovoLog a.c.12 Bfst 14 lunch - 16 supper

## 2013-04-08 NOTE — Progress Notes (Addendum)
Patient ID: [name], female   DOB: 24-Nov-1960, 53 y.o.   MRN: [id number]   Reason for Appointment : Follow up for Type 1 Diabetes  History of Present Illness          Diagnosis: Type 2 diabetes mellitus, date of diagnosis: 1990        Past history: She has been on insulin since 2007. Generally has had poor control in requiring large doses of insulin. Blood sugars also have been previously variable making her control difficult. She has had difficulty following instructions for mealtime insulin consistently in the past  Has been on a complicated regimen of Lantus twice a day, NovoLog a.c. and NPH at bedtime  INSULIN regimen is described as: Lantus a.m. 40 - 30 in PM. Humulin N 30 hs, NovoLog a.c.12- 16- 20   Recent history: She was started on Victoza in 11/2012 because of persistent poor control and using large doses of insulin. Also was tending to have relatively high postprandial readings with significant variability She appears to have had overall better readings with adding Victoza but is taking only 0.6 mg, did have some diarrhea with this initially Is taking significantly lower dose of LANTUS insulin now and somewhat less NPH but still requiring relatively larger doses of NovoLog Has lost 5 pounds of weight since her last visit also Blood sugar patterns:  Relatively good fasting readings usually.  Mild increase in glucose around lunchtime  Variable readings at suppertime and bedtime with the average at any given time of the day ranges from 124-185  Occasional tendency to low blood sugars recently midday or suppertime   Glucose monitoring:  done times a day         Glucometer:  Accu-Chek [employer]       Blood Glucose readings from meter download:   PREMEAL Breakfast Lunch Dinner Bedtime Overall  Glucose range:  68-243   49- 213  60-221   53-322   49-337   Mean/median:  124   172    160   153   Hypoglycemia:   as above lowest reading 49 at lunchtime  Self-care: The diet that  the patient has been following is: Small portionsUsually   Meals: 3 meals per day. Bfst 9am; lunch 2 pm Supper at about 6-7 PM, mealtimes are variable          Physical activity: exercise: Walking 3/7 days in mall.          Dietician visit: Most recent:.Several years ago           Wt Readings from Last 3 Encounters:  04/08/13 184 lb 12.8 oz (83.825 kg)  04/07/13 185 lb (83.915 kg)  04/02/13 186 lb 6.4 oz (84.55 kg)   Previous A1c results, 9.9, 11.3 Lab Results  Component Value Date   HGBA1C 8.5* 03/18/2013   HGBA1C 10.7* 12/01/2012   HGBA1C 10.9* 03/13/2012   Lab Results  Component Value Date   MICROALBUR 2.4* 03/18/2013   LDLCALC 30 04/18/2009   CREATININE 2.6* 01/27/2013     Lab Results  Component Value Date   MICROALBUR 2.4* 03/18/2013      Medication List       This list is accurate as of: 04/08/13  3:42 PM.  Always use your most recent med list.               acetaminophen 500 MG tablet  Commonly known as:  TYLENOL  Take 500 mg by mouth every 6 (six) hours as needed  for moderate pain.     allopurinol 100 MG tablet  Commonly known as:  ZYLOPRIM  Take 150 mg by mouth every morning.     benazepril 20 MG tablet  Commonly known as:  LOTENSIN  Take 10 mg by mouth every morning. Takes 1/2 tablet     COLCRYS 0.6 MG tablet  Generic drug:  colchicine  Take 0.6 mg by mouth daily as needed. For GOUT FLARE     COREG CR 40 MG 24 hr capsule  Generic drug:  carvedilol  TAKE ONE CAPSULE BY MOUTH DAILY.     furosemide 80 MG tablet  Commonly known as:  LASIX  Take 80 mg by mouth daily.     HYDROcodone-acetaminophen 5-325 MG per tablet  Commonly known as:  NORCO/VICODIN  Take 1-2 tablets by mouth every 6 (six) hours as needed.     insulin aspart 100 UNIT/ML injection  Commonly known as:  novoLOG  Inject 12-20 Units into the skin 3 (three) times daily. Inject 12 units at 8:00am, 16 units at lunch and 20 units at suppertime     NOVOLOG FLEXPEN 100 UNIT/ML FlexPen   Generic drug:  insulin aspart  INJECT SUBCUTANEOUSLY 12 UNITS BEFORE BREAKFAST; 16 UNITS AT LUNCH; AND 20 UNITS AT DINNER.     insulin glargine 100 UNIT/ML injection  Commonly known as:  LANTUS  Inject 0.3-0.4 mLs (30-40 Units total) into the skin 2 (two) times daily. Inject 40 units in am and 30 units at bedtime     insulin NPH Human 100 UNIT/ML injection  Commonly known as:  HUMULIN N,NOVOLIN N  Inject 34 Units into the skin at bedtime.     HUMULIN N KWIKPEN 100 UNIT/ML Kiwkpen  Generic drug:  Insulin Isophane Human  INJECT 34 UNITS SUBCUTANEOUSLY AT BEDTIME.     Insulin Pen Needle 32G X 5 MM Misc  Commonly known as:  NOVOTWIST  1 each by Does not apply route daily.     LANOXIN 0.125 MG tablet  Generic drug:  digoxin  TAKE 1 TABLET BY MOUTH ON MONDAY THROUGH FRIDAY AS DIRECTED.     Liraglutide 18 MG/3ML Sopn  Commonly known as:  VICTOZA  Inject 0.6 mg into the skin as directed.     pantoprazole 40 MG tablet  Commonly known as:  PROTONIX  1 PO 30 MINS PRIOR TO YOUR FIRST MEAL     spironolactone 25 MG tablet  Commonly known as:  ALDACTONE  Take 25 mg by mouth every morning.     warfarin 5 MG tablet  Commonly known as:  COUMADIN  Take one 1/2 tablet (2.5mg ) everyday except Monday and Thursdays take one whole tablet (5mg )        Allergies:  Allergies  Allergen Reactions  . Wheat Swelling  . Latex Rash  . Penicillins Rash  . Sulfa Antibiotics Rash    Past Medical History  Diagnosis Date  . 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 Diabetes mellitus     insulin treated; Glucose-257 in 01/2012   . Congenital third degree heart block     Guidant VVI pacemaker implanted in 09/1997; 8generator change in 01/2004   . Chronic atrial fibrillation     embolic rind  . Cardiomyopathy     with congestive heart failure   ejection fraction of 30% in 09/2003  . Chronic anticoagulation   . Helicobacter pylori gastritis 08/14/2011  . Chronic kidney  disease     creatinine- 1.8 in 09/2006  and 2.0 in 05/2007; 2.05 in 2011, 2.29 in 2012  . Dysphagia 08/14/2011    FEB 2013 EGD/DIL 16 MM; GERD; h/o gastroesophageal reflux disease; + H. Pylori gastritis   . Dizziness and giddiness 05/19/2012    Past Surgical History  Procedure Laterality Date  . Insert / replace / remove pacemaker  09/1997 w/gen. change in 01/2004    Guidant VVI [employer]  . Colonoscopy  04/23/2011    [id number] COLON POLYP/ INTERNAL HEMORRHOIDS/ TORTUOUS COLON  . Upper gastrointestinal endoscopy  FEB 2013    RING/DIL TO 16 MM, H PYLORI GASTRITIS    Family History  Problem Relation Age of Onset  . Adopted: Yes  . Colon cancer Neg Hx     Social History:  reports that she has never smoked. She has never used smokeless tobacco. She reports that she does not drink alcohol or use illicit drugs.    Review of Systems:   She has had stable chronic renal insufficiency, followed by nephrologist She has had cardiomyopathy and atrial fibrillation followed by cardiologist  No symptoms  of numbness in her feet recently  Lipids: Dense to have dyslipidemia with low HDL, high triglycerides  Lab Results  Component Value Date   CHOL 144 01/27/2013   HDL 23.00* 01/27/2013   LDLCALC 30 04/18/2009   LDLDIRECT 93.1 01/27/2013   TRIG 209.0* 01/27/2013   CHOLHDL 6 01/27/2013     Physical Examination:  BP 118/78  Pulse 59  Temp(Src) 97.9 F (36.6 C)  Resp 14  Ht 5\' 5"  (1.651 m)  Wt 184 lb 12.8 oz (83.825 kg)  BMI 30.75 kg/m2  SpO2 98%         ASSESSMENT:  Diabetes type 1:   Her blood sugars are  overall better with using Victoza and she has responded to the lowest dose of 0.6 mg However her A1c still over 8% even though her average glucose at home is 153 with testing at least twice a day on average As discussed in history of present illness she has lost a little weight and is taking less basal insulin overall with Victoza  She  does have tendency to  recent  hypoglycemia especially midday indicating excessive Lantus during the day  Overall has been very compliant and motivated with self-care including insulin regimen, glucose monitoring, reducing portion sizes and trying to walk   Hyperlipidemia: She can benefit from fenofibrate but probably not a candidate because of significant renal insufficiency  PLAN:   Will make the following modifications in insulin:  Lantus a.m. 34 in am - 30 in PM.  Humulin N 30 hs, NovoLog a.c.12 Bfst 14 lunch - 16 supper   Call if blood sugar is getting low again Adjust her mealtime dose based on meal size take extra if blood sugars are higher Before eating Balanced meals with enough protein and limit her carbohydrate  Counseling time over 50% of today's 25 minute visit  Charliegh Vasudevan 04/08/2013, 3:42 PM

## 2013-04-09 NOTE — Telephone Encounter (Signed)
Pt calling again today to see if we have got anything worked out with her coreg/ She want to speak with nurse again

## 2013-04-09 NOTE — Telephone Encounter (Signed)
Pt came into office this week to see Dr Lovena Le. This nurse has been working on it for a week and a half. We have sent a written prescription twice and called for a prior authorization twice. This nurse as well as Maudie Mercury, LPN and Dr Lovena Le all told her that we have done everything we could. Just called pharmacy and has been approved.

## 2013-04-11 DIAGNOSIS — E785 Hyperlipidemia, unspecified: Secondary | ICD-10-CM | POA: Insufficient documentation

## 2013-04-12 ENCOUNTER — Encounter (HOSPITAL_COMMUNITY): Payer: Self-pay | Admitting: Pharmacy Technician

## 2013-04-14 ENCOUNTER — Encounter: Payer: Medicaid Other | Admitting: Internal Medicine

## 2013-04-15 ENCOUNTER — Encounter: Payer: Self-pay | Admitting: Cardiovascular Disease

## 2013-04-15 ENCOUNTER — Ambulatory Visit (INDEPENDENT_AMBULATORY_CARE_PROVIDER_SITE_OTHER): Payer: Medicaid Other | Admitting: Cardiovascular Disease

## 2013-04-15 VITALS — BP 105/70 | HR 76 | Ht 66.0 in | Wt 189.2 lb

## 2013-04-15 DIAGNOSIS — I428 Other cardiomyopathies: Secondary | ICD-10-CM

## 2013-04-15 DIAGNOSIS — I10 Essential (primary) hypertension: Secondary | ICD-10-CM

## 2013-04-15 DIAGNOSIS — N184 Chronic kidney disease, stage 4 (severe): Secondary | ICD-10-CM

## 2013-04-15 DIAGNOSIS — Q246 Congenital heart block: Secondary | ICD-10-CM

## 2013-04-15 DIAGNOSIS — Z950 Presence of cardiac pacemaker: Secondary | ICD-10-CM

## 2013-04-15 DIAGNOSIS — I5022 Chronic systolic (congestive) heart failure: Secondary | ICD-10-CM

## 2013-04-15 DIAGNOSIS — E119 Type 2 diabetes mellitus without complications: Secondary | ICD-10-CM

## 2013-04-15 DIAGNOSIS — I4891 Unspecified atrial fibrillation: Secondary | ICD-10-CM

## 2013-04-15 DIAGNOSIS — Z5181 Encounter for therapeutic drug level monitoring: Secondary | ICD-10-CM

## 2013-04-15 DIAGNOSIS — Z7901 Long term (current) use of anticoagulants: Secondary | ICD-10-CM

## 2013-04-15 NOTE — Progress Notes (Signed)
Patient ID: BRIAH MOSELY, female   DOB: [birth year], 53 y.o.   MRN: [id number]      SUBJECTIVE: The patient is a 53 year old woman with a history of congenital third degree heart block, atrial fibrillation, nonischemic myopathy, and insulin-dependent diabetes mellitus. She follows up with Dr. Lovena Le and he is planning to do an RV lead revision for a pacemaker. An echocardiogram to reassess her LV systolic function has been ordered, to see whether a BiV pacemaker or BiV ICD is indicated. Her most recent HbA1c is 8.5 which is significantly lower than what it had been approximately one year ago at 10.9. She says that she has felt very well for the past 3-4 months. She denies chest pain, palpitations, leg swelling, orthopnea, paroxysmal nocturnal dyspnea. She does have some exertional dyspnea which appears to be stable with class II heart failure symptoms. She has been trying to watch her sodium intake and be strict about taking her medications in a timely fashion.    Allergies  Allergen Reactions  . Wheat Swelling  . Latex Rash  . Penicillins Rash  . Sulfa Antibiotics Rash    Current Outpatient Prescriptions  Medication Sig Dispense Refill  . acetaminophen (TYLENOL) 500 MG tablet Take 500 mg by mouth every 6 (six) hours as needed for moderate pain.      Marland Kitchen allopurinol (ZYLOPRIM) 100 MG tablet Take 150 mg by mouth every morning.       . benazepril (LOTENSIN) 20 MG tablet Take 10 mg by mouth every morning. Takes 1/2 tablet      . carvedilol (COREG CR) 40 MG 24 hr capsule Take 40 mg by mouth daily.      Marland Kitchen COLCRYS 0.6 MG tablet Take 0.6 mg by mouth daily as needed (gout flare).       Marland Kitchen digoxin (LANOXIN) 0.125 MG tablet Take 0.125 mg by mouth daily. Every day except Saturday and sunday      . furosemide (LASIX) 80 MG tablet Take 80 mg by mouth daily.      . insulin aspart (NOVOLOG FLEXPEN) 100 UNIT/ML FlexPen Inject 12-20 Units into the skin 3 (three) times daily with meals. 12 units in the  morning,16 units at lunch and 20 units at supper      . Insulin Glargine (LANTUS SOLOSTAR) 100 UNIT/ML Solostar Pen Inject 30-40 Units into the skin 2 (two) times daily. 40 units in the morning and 30 units at bedtime      . Insulin Isophane Human (HUMULIN N KWIKPEN) 100 UNIT/ML Kiwkpen Inject 34 Units into the skin at bedtime.      . Liraglutide (VICTOZA) 18 MG/3ML SOPN Inject 0.6 mg into the skin daily.      . pantoprazole (PROTONIX) 40 MG tablet Take 40 mg by mouth daily. 30 mins prior to first meal      . spironolactone (ALDACTONE) 25 MG tablet Take 25 mg by mouth every morning.       . warfarin (COUMADIN) 5 MG tablet Take 2.5-5 mg by mouth daily. Take 1 tablet (5 mg) on Mondays and thursdays and all other days half tablet (2.5 mg)       No current facility-administered medications for this visit.    Past Medical History  Diagnosis Date  . 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 Diabetes mellitus     insulin treated; Glucose-257 in 01/2012   . Congenital third degree heart block     Guidant VVI pacemaker implanted in 09/1997; 8generator  change in 01/2004   . Chronic atrial fibrillation     embolic rind  . Cardiomyopathy     with congestive heart failure   ejection fraction of 30% in 09/2003  . Chronic anticoagulation   . Helicobacter pylori gastritis 08/14/2011  . Chronic kidney disease     creatinine- 1.8 in 09/2006 and 2.0 in 05/2007; 2.05 in 2011, 2.29 in 2012  . Dysphagia 08/14/2011    FEB 2013 EGD/DIL 16 MM; GERD; h/o gastroesophageal reflux disease; + H. Pylori gastritis   . Dizziness and giddiness 05/19/2012    Past Surgical History  Procedure Laterality Date  . Insert / replace / remove pacemaker  09/1997 w/gen. change in 01/2004    Guidant VVI [employer]  . Colonoscopy  04/23/2011    [id number] COLON POLYP/ INTERNAL HEMORRHOIDS/ TORTUOUS COLON  . Upper gastrointestinal endoscopy  FEB 2013    RING/DIL TO 16 MM, H PYLORI GASTRITIS     History   Social History  . Marital Status: Married    Spouse Name: N/A    Number of Children: 4  . Years of Education: N/A   Occupational History  .     Social History Main Topics  . Smoking status: Never Smoker   . Smokeless tobacco: Never Used  . Alcohol Use: No  . Drug Use: No  . Sexual Activity: Not on file   Other Topics Concern  . Not on file   Social History Narrative  . No narrative on file     Filed Vitals:   04/15/13 1030  BP: 105/70  Pulse: 76  Height: 5\' 6"  (1.676 m)  Weight: 189 lb 4 oz (85.843 kg)    PHYSICAL EXAM General: NAD Neck: No JVD, no thyromegaly or thyroid nodule.  Lungs: Clear to auscultation bilaterally with normal respiratory effort. CV: Nondisplaced PMI.  Heart regular S1/S2, no [id number], II/VI pansystolic murmur along left sternal border.  No peripheral edema.  No carotid bruit.  Normal pedal pulses.  Abdomen: Soft, nontender, no hepatosplenomegaly, no distention.  Neurologic: Alert and oriented x 3.  Psych: Normal affect. Extremities: No clubbing or cyanosis.   ECG: reviewed and available in electronic records.      ASSESSMENT AND PLAN: 1. Nonischemic cardiomyopathy/chronic systolic heart failure: currently with NYHA class II symptoms. Continue current diuretic regimen and 80 mg daily and furosemide and spironolactone 25 mg daily. She is awaiting a reassessment of her left ventricular systolic function to determine whether a biventricular pacemaker or biventricular ICD is indicated. In either case, I explained to her that a biventricular upgrade will ameliorate further heart failure exacerbations do to ventricular synchrony. 2. HTN: well controlled on present therapy. 3. Atrial fibrillation: Heart rate is well controlled on carvedilol and digoxin. She maintains anticoagulation with warfarin and is without bleeding problems. 4. Congenital third-degree heart block with pacemaker: Right ventricular lead revision is being planned by  Dr. Lovena Le.  Dispo: f/u 4 months.  Time spent: 40 minutes.  Kate Sable, M.D., F.A.C.C.

## 2013-04-15 NOTE — Patient Instructions (Signed)
Your physician recommends that you schedule a follow-up appointment in: 4 months   Your physician recommends that you continue on your current medications as directed. Please refer to the Current Medication list given to you today.    Thanks for choosing [facility] !

## 2013-04-16 ENCOUNTER — Ambulatory Visit (HOSPITAL_COMMUNITY)
Admission: RE | Admit: 2013-04-16 | Discharge: 2013-04-16 | Disposition: A | Discharge status: Home / Self Care | Payer: Medicaid Other | Source: Ambulatory Visit | Attending: Internal Medicine | Admitting: Internal Medicine

## 2013-04-16 DIAGNOSIS — Z01818 Encounter for other preprocedural examination: Secondary | ICD-10-CM

## 2013-04-16 DIAGNOSIS — I4891 Unspecified atrial fibrillation: Secondary | ICD-10-CM | POA: Insufficient documentation

## 2013-04-16 DIAGNOSIS — I509 Heart failure, unspecified: Secondary | ICD-10-CM | POA: Insufficient documentation

## 2013-04-16 DIAGNOSIS — I129 Hypertensive chronic kidney disease with stage 1 through stage 4 chronic kidney disease, or unspecified chronic kidney disease: Secondary | ICD-10-CM | POA: Insufficient documentation

## 2013-04-16 DIAGNOSIS — N184 Chronic kidney disease, stage 4 (severe): Secondary | ICD-10-CM | POA: Insufficient documentation

## 2013-04-16 DIAGNOSIS — I369 Nonrheumatic tricuspid valve disorder, unspecified: Secondary | ICD-10-CM

## 2013-04-16 DIAGNOSIS — E119 Type 2 diabetes mellitus without complications: Secondary | ICD-10-CM | POA: Insufficient documentation

## 2013-04-16 NOTE — Progress Notes (Signed)
*  PRELIMINARY RESULTS* Echocardiogram 2D Echocardiogram has been performed.  [place], [place] 04/16/2013, 12:21 PM

## 2013-04-19 ENCOUNTER — Telehealth: Payer: Self-pay | Admitting: Internal Medicine

## 2013-04-19 LAB — CBC WITH DIFFERENTIAL/PLATELET
BASOS ABS: 0.1 10*3/uL (ref 0.0–0.1)
BASOS PCT: 1 % (ref 0–1)
EOS PCT: 1 % (ref 0–5)
Eosinophils Absolute: 0.1 10*3/uL (ref 0.0–0.7)
HCT: 41.3 % (ref 36.0–46.0)
Hemoglobin: 13.2 g/dL (ref 12.0–15.0)
Lymphocytes Relative: 29 % (ref 12–46)
Lymphs Abs: 2.1 10*3/uL (ref 0.7–4.0)
MCH: 26.6 pg (ref 26.0–34.0)
MCHC: 32 g/dL (ref 30.0–36.0)
MCV: 83.1 fL (ref 78.0–100.0)
Monocytes Absolute: 0.4 10*3/uL (ref 0.1–1.0)
Monocytes Relative: 5 % (ref 3–12)
NEUTROS ABS: 4.5 10*3/uL (ref 1.7–7.7)
Neutrophils Relative %: 64 % (ref 43–77)
PLATELETS: 172 10*3/uL (ref 150–400)
RBC: 4.97 MIL/uL (ref 3.87–5.11)
RDW: 17 % — AB (ref 11.5–15.5)
WBC: 7.1 10*3/uL (ref 4.0–10.5)

## 2013-04-19 LAB — BASIC METABOLIC PANEL
BUN: 59 mg/dL — AB (ref 6–23)
CALCIUM: 9.4 mg/dL (ref 8.4–10.5)
CO2: 28 mEq/L (ref 19–32)
Chloride: 100 mEq/L (ref 96–112)
Creat: 2.34 mg/dL — ABNORMAL HIGH (ref 0.50–1.10)
GLUCOSE: 54 mg/dL — AB (ref 70–99)
Potassium: 4.6 mEq/L (ref 3.5–5.3)
Sodium: 140 mEq/L (ref 135–145)

## 2013-04-19 LAB — PROTIME-INR
INR: 2.1 — ABNORMAL HIGH (ref <1.50)
Prothrombin Time: 22.9 seconds — ABNORMAL HIGH (ref 11.6–15.2)

## 2013-04-19 LAB — APTT: APTT: 36.8 s (ref 24–37)

## 2013-04-19 NOTE — Telephone Encounter (Signed)
Pt wants to know results for echo

## 2013-04-19 NOTE — Telephone Encounter (Signed)
New message     Pt want echo results and she have questions about her upcoming procedure

## 2013-04-20 MED ORDER — VANCOMYCIN HCL IN DEXTROSE 1-5 GM/200ML-% IV SOLN
1000.0000 mg | INTRAVENOUS | Status: DC
  Filled 2013-04-20: qty 200

## 2013-04-20 MED ORDER — SODIUM CHLORIDE 0.9 % IR SOLN
80.0000 mg | Status: DC
  Filled 2013-04-20: qty 2

## 2013-04-21 ENCOUNTER — Ambulatory Visit (HOSPITAL_COMMUNITY)
Admission: RE | Admit: 2013-04-21 | Discharge: 2013-04-22 | Disposition: A | Discharge status: Home / Self Care | Payer: Medicaid Other | Source: Ambulatory Visit | Attending: Internal Medicine | Admitting: Internal Medicine

## 2013-04-21 ENCOUNTER — Encounter (HOSPITAL_COMMUNITY)
Admission: RE | Disposition: A | Discharge status: Home / Self Care | Payer: Self-pay | Source: Ambulatory Visit | Attending: Internal Medicine

## 2013-04-21 ENCOUNTER — Encounter (HOSPITAL_COMMUNITY): Payer: Self-pay | Admitting: General Practice

## 2013-04-21 DIAGNOSIS — E1129 Type 2 diabetes mellitus with other diabetic kidney complication: Secondary | ICD-10-CM

## 2013-04-21 DIAGNOSIS — Q246 Congenital heart block: Secondary | ICD-10-CM

## 2013-04-21 DIAGNOSIS — I428 Other cardiomyopathies: Secondary | ICD-10-CM

## 2013-04-21 DIAGNOSIS — Z4502 Encounter for adjustment and management of automatic implantable cardiac defibrillator: Secondary | ICD-10-CM | POA: Insufficient documentation

## 2013-04-21 DIAGNOSIS — I5022 Chronic systolic (congestive) heart failure: Secondary | ICD-10-CM | POA: Diagnosis present

## 2013-04-21 DIAGNOSIS — Z0189 Encounter for other specified special examinations: Secondary | ICD-10-CM

## 2013-04-21 DIAGNOSIS — Z5181 Encounter for therapeutic drug level monitoring: Secondary | ICD-10-CM

## 2013-04-21 DIAGNOSIS — Z880 Allergy status to penicillin: Secondary | ICD-10-CM | POA: Insufficient documentation

## 2013-04-21 DIAGNOSIS — Z7901 Long term (current) use of anticoagulants: Secondary | ICD-10-CM | POA: Insufficient documentation

## 2013-04-21 DIAGNOSIS — Z91018 Allergy to other foods: Secondary | ICD-10-CM | POA: Insufficient documentation

## 2013-04-21 DIAGNOSIS — Z91040 Latex allergy status: Secondary | ICD-10-CM | POA: Insufficient documentation

## 2013-04-21 DIAGNOSIS — E119 Type 2 diabetes mellitus without complications: Secondary | ICD-10-CM | POA: Insufficient documentation

## 2013-04-21 DIAGNOSIS — E785 Hyperlipidemia, unspecified: Secondary | ICD-10-CM

## 2013-04-21 DIAGNOSIS — M109 Gout, unspecified: Secondary | ICD-10-CM

## 2013-04-21 DIAGNOSIS — Z882 Allergy status to sulfonamides status: Secondary | ICD-10-CM | POA: Insufficient documentation

## 2013-04-21 DIAGNOSIS — I639 Cerebral infarction, unspecified: Secondary | ICD-10-CM

## 2013-04-21 DIAGNOSIS — N184 Chronic kidney disease, stage 4 (severe): Secondary | ICD-10-CM

## 2013-04-21 DIAGNOSIS — N189 Chronic kidney disease, unspecified: Secondary | ICD-10-CM | POA: Insufficient documentation

## 2013-04-21 DIAGNOSIS — E1165 Type 2 diabetes mellitus with hyperglycemia: Secondary | ICD-10-CM

## 2013-04-21 DIAGNOSIS — I517 Cardiomegaly: Secondary | ICD-10-CM | POA: Insufficient documentation

## 2013-04-21 DIAGNOSIS — I4891 Unspecified atrial fibrillation: Secondary | ICD-10-CM | POA: Insufficient documentation

## 2013-04-21 DIAGNOSIS — Z794 Long term (current) use of insulin: Secondary | ICD-10-CM | POA: Insufficient documentation

## 2013-04-21 DIAGNOSIS — I1310 Hypertensive heart and chronic kidney disease without heart failure, with stage 1 through stage 4 chronic kidney disease, or unspecified chronic kidney disease: Secondary | ICD-10-CM

## 2013-04-21 DIAGNOSIS — I4892 Unspecified atrial flutter: Secondary | ICD-10-CM | POA: Insufficient documentation

## 2013-04-21 DIAGNOSIS — I509 Heart failure, unspecified: Secondary | ICD-10-CM | POA: Insufficient documentation

## 2013-04-21 DIAGNOSIS — R1013 Epigastric pain: Secondary | ICD-10-CM

## 2013-04-21 DIAGNOSIS — Z79899 Other long term (current) drug therapy: Secondary | ICD-10-CM | POA: Insufficient documentation

## 2013-04-21 DIAGNOSIS — Z950 Presence of cardiac pacemaker: Secondary | ICD-10-CM

## 2013-04-21 HISTORY — DX: Type 2 diabetes mellitus without complications: E11.9

## 2013-04-21 HISTORY — PX: BI-VENTRICULAR IMPLANTABLE CARDIOVERTER DEFIBRILLATOR  (CRT-D): SHX5747

## 2013-04-21 HISTORY — DX: Chronic systolic (congestive) heart failure: I50.22

## 2013-04-21 HISTORY — DX: Long term (current) use of insulin: Z79.4

## 2013-04-21 HISTORY — PX: BI-VENTRICULAR IMPLANTABLE CARDIOVERTER DEFIBRILLATOR UPGRADE: SHX5461

## 2013-04-21 HISTORY — DX: Other cardiomyopathies: I42.8

## 2013-04-21 HISTORY — DX: Calculus of kidney: N20.0

## 2013-04-21 HISTORY — DX: Reserved for inherently not codable concepts without codable children: IMO0001

## 2013-04-21 HISTORY — DX: Presence of automatic (implantable) cardiac defibrillator: Z95.810

## 2013-04-21 LAB — SURGICAL PCR SCREEN
MRSA, PCR: NEGATIVE
Staphylococcus aureus: NEGATIVE

## 2013-04-21 LAB — GLUCOSE, CAPILLARY
GLUCOSE-CAPILLARY: 112 mg/dL — AB (ref 70–99)
GLUCOSE-CAPILLARY: 116 mg/dL — AB (ref 70–99)
GLUCOSE-CAPILLARY: 129 mg/dL — AB (ref 70–99)
GLUCOSE-CAPILLARY: 119 mg/dL — AB (ref 70–99)

## 2013-04-21 LAB — PROTIME-INR
INR: 2.58 — AB (ref 0.00–1.49)
Prothrombin Time: 26.8 seconds — ABNORMAL HIGH (ref 11.6–15.2)

## 2013-04-21 SURGERY — BI-VENTRICULAR IMPLANTABLE CARDIOVERTER DEFIBRILLATOR UPGRADE
Anesthesia: LOCAL

## 2013-04-21 MED ORDER — INSULIN ASPART 100 UNIT/ML FLEXPEN: 12.0000 [IU] | PEN_INJECTOR | Freq: Three times a day (TID) | SUBCUTANEOUS | Status: DC

## 2013-04-21 MED ORDER — CARVEDILOL PHOSPHATE ER 40 MG PO CP24
40.0000 mg | ORAL_CAPSULE | Freq: Every day | ORAL | Status: DC
  Administered 2013-04-22: 40 mg via ORAL
  Filled 2013-04-21: qty 1

## 2013-04-21 MED ORDER — ACETAMINOPHEN 500 MG PO TABS: 500.0000 mg | ORAL_TABLET | Freq: Four times a day (QID) | ORAL | Status: DC | PRN

## 2013-04-21 MED ORDER — MIDAZOLAM HCL 5 MG/5ML IJ SOLN
INTRAMUSCULAR | Status: AC
  Filled 2013-04-21: qty 5

## 2013-04-21 MED ORDER — MUPIROCIN 2 % EX OINT
TOPICAL_OINTMENT | Freq: Once | CUTANEOUS | Status: AC
  Administered 2013-04-21: 1 via NASAL
  Filled 2013-04-21: qty 22

## 2013-04-21 MED ORDER — FENTANYL CITRATE 0.05 MG/ML IJ SOLN
INTRAMUSCULAR | Status: AC
  Filled 2013-04-21: qty 2

## 2013-04-21 MED ORDER — DIGOXIN 125 MCG PO TABS
0.1250 mg | ORAL_TABLET | ORAL | Status: DC
  Administered 2013-04-22: 0.125 mg via ORAL
  Filled 2013-04-21 (×2): qty 1

## 2013-04-21 MED ORDER — ALLOPURINOL 150 MG HALF TABLET
150.0000 mg | ORAL_TABLET | Freq: Every morning | ORAL | Status: DC
  Administered 2013-04-22: 150 mg via ORAL
  Filled 2013-04-21: qty 1

## 2013-04-21 MED ORDER — INSULIN GLARGINE 100 UNIT/ML ~~LOC~~ SOLN
40.0000 [IU] | Freq: Every day | SUBCUTANEOUS | Status: DC
  Administered 2013-04-22: 40 [IU] via SUBCUTANEOUS
  Filled 2013-04-21: qty 0.4

## 2013-04-21 MED ORDER — INSULIN ASPART 100 UNIT/ML ~~LOC~~ SOLN: 20.0000 [IU] | Freq: Every day | SUBCUTANEOUS | Status: DC

## 2013-04-21 MED ORDER — INSULIN ASPART 100 UNIT/ML ~~LOC~~ SOLN
12.0000 [IU] | Freq: Every day | SUBCUTANEOUS | Status: DC
  Administered 2013-04-22: 12 [IU] via SUBCUTANEOUS

## 2013-04-21 MED ORDER — WARFARIN SODIUM 5 MG PO TABS
5.0000 mg | ORAL_TABLET | ORAL | Status: DC
  Filled 2013-04-21: qty 1

## 2013-04-21 MED ORDER — COLCHICINE 0.6 MG PO TABS
0.6000 mg | ORAL_TABLET | Freq: Every day | ORAL | Status: DC | PRN
  Filled 2013-04-21: qty 1

## 2013-04-21 MED ORDER — WARFARIN SODIUM 2.5 MG PO TABS
2.5000 mg | ORAL_TABLET | ORAL | Status: AC
  Administered 2013-04-21: 2.5 mg via ORAL
  Filled 2013-04-21: qty 1

## 2013-04-21 MED ORDER — INSULIN GLARGINE 100 UNIT/ML ~~LOC~~ SOLN
40.0000 [IU] | Freq: Every day | SUBCUTANEOUS | Status: DC
  Filled 2013-04-21: qty 0.4

## 2013-04-21 MED ORDER — CHLORHEXIDINE GLUCONATE 4 % EX LIQD
60.0000 mL | Freq: Once | CUTANEOUS | Status: DC
  Filled 2013-04-21: qty 60

## 2013-04-21 MED ORDER — VANCOMYCIN HCL IN DEXTROSE 1-5 GM/200ML-% IV SOLN
1000.0000 mg | Freq: Two times a day (BID) | INTRAVENOUS | Status: AC
  Filled 2013-04-21 (×2): qty 200

## 2013-04-21 MED ORDER — INSULIN ASPART 100 UNIT/ML ~~LOC~~ SOLN
18.0000 [IU] | Freq: Every day | SUBCUTANEOUS | Status: DC
  Administered 2013-04-21: 16 [IU] via SUBCUTANEOUS

## 2013-04-21 MED ORDER — VANCOMYCIN HCL IN DEXTROSE 1-5 GM/200ML-% IV SOLN
1000.0000 mg | Freq: Two times a day (BID) | INTRAVENOUS | Status: AC
  Administered 2013-04-21: 1000 mg via INTRAVENOUS
  Filled 2013-04-21: qty 200

## 2013-04-21 MED ORDER — PANTOPRAZOLE SODIUM 40 MG PO TBEC
40.0000 mg | DELAYED_RELEASE_TABLET | Freq: Every day | ORAL | Status: DC
  Administered 2013-04-22: 40 mg via ORAL
  Filled 2013-04-21: qty 1

## 2013-04-21 MED ORDER — BENAZEPRIL HCL 10 MG PO TABS
10.0000 mg | ORAL_TABLET | Freq: Every morning | ORAL | Status: DC
  Administered 2013-04-22: 10 mg via ORAL
  Filled 2013-04-21: qty 1

## 2013-04-21 MED ORDER — ONDANSETRON HCL 4 MG/2ML IJ SOLN: 4.0000 mg | Freq: Four times a day (QID) | INTRAMUSCULAR | Status: DC | PRN

## 2013-04-21 MED ORDER — INSULIN ISOPHANE HUMAN 100 UNIT/ML KWIKPEN: 34.0000 [IU] | PEN_INJECTOR | Freq: Every day | SUBCUTANEOUS | Status: DC

## 2013-04-21 MED ORDER — LIDOCAINE HCL (PF) 1 % IJ SOLN
INTRAMUSCULAR | Status: AC
  Filled 2013-04-21: qty 60

## 2013-04-21 MED ORDER — MUPIROCIN 2 % EX OINT
TOPICAL_OINTMENT | CUTANEOUS | Status: AC
  Filled 2013-04-21: qty 22

## 2013-04-21 MED ORDER — INSULIN GLARGINE 100 UNIT/ML ~~LOC~~ SOLN
30.0000 [IU] | Freq: Every day | SUBCUTANEOUS | Status: DC
  Administered 2013-04-21: 30 [IU] via SUBCUTANEOUS
  Filled 2013-04-21 (×2): qty 0.3

## 2013-04-21 MED ORDER — WARFARIN - PHYSICIAN DOSING INPATIENT: Freq: Every day | Status: DC

## 2013-04-21 MED ORDER — LIRAGLUTIDE 18 MG/3ML ~~LOC~~ SOPN: 0.6000 mg | PEN_INJECTOR | SUBCUTANEOUS | Status: DC

## 2013-04-21 MED ORDER — INSULIN NPH (HUMAN) (ISOPHANE) 100 UNIT/ML ~~LOC~~ SUSP
34.0000 [IU] | Freq: Every day | SUBCUTANEOUS | Status: DC
  Filled 2013-04-21: qty 10

## 2013-04-21 MED ORDER — INSULIN GLARGINE 100 UNIT/ML SOLOSTAR PEN: 30.0000 [IU] | PEN_INJECTOR | Freq: Two times a day (BID) | SUBCUTANEOUS | Status: DC

## 2013-04-21 MED ORDER — SODIUM CHLORIDE 0.9 % IV SOLN
INTRAVENOUS | Status: DC
Start: 2013-04-21 — End: 2013-04-21

## 2013-04-21 MED ORDER — WARFARIN SODIUM 2.5 MG PO TABS: 2.5000 mg | ORAL_TABLET | Freq: Every day | ORAL | Status: DC

## 2013-04-21 MED ORDER — ACETAMINOPHEN 325 MG PO TABS
325.0000 mg | ORAL_TABLET | ORAL | Status: DC | PRN
Start: 2013-04-21 — End: 2013-04-22
  Administered 2013-04-21 – 2013-04-22 (×2): 650 mg via ORAL
  Filled 2013-04-21 (×2): qty 2

## 2013-04-21 MED ORDER — INSULIN GLARGINE 100 UNIT/ML ~~LOC~~ SOLN: 30.0000 [IU] | Freq: Every day | SUBCUTANEOUS | Status: DC

## 2013-04-21 MED ORDER — HEPARIN SODIUM (PORCINE) 1000 UNIT/ML IJ SOLN
INTRAMUSCULAR | Status: AC
  Filled 2013-04-21: qty 1

## 2013-04-21 MED ORDER — INSULIN ASPART 100 UNIT/ML ~~LOC~~ SOLN: 16.0000 [IU] | Freq: Every day | SUBCUTANEOUS | Status: DC

## 2013-04-21 MED ORDER — SPIRONOLACTONE 25 MG PO TABS
25.0000 mg | ORAL_TABLET | Freq: Every morning | ORAL | Status: DC
  Administered 2013-04-22: 25 mg via ORAL
  Filled 2013-04-21 (×2): qty 1

## 2013-04-21 NOTE — H&P (View-Only) (Signed)
HPI [name] returns today for followup. She is a very pleasant 53 year old woman with congenital complete heart block, chronic atrial fibrillation, who also has left ventricular dysfunction. For years, the patient has been class I. She developed worsening heart failure symptoms several months ago, and required up titration of her medical therapy. Her ejection fraction was 20% by echo in 2013. Since her up titration in treatment, her heart failure symptoms have improved. She is now class IIA. The patient has been struggling with diabetes. Her hemoglobin A1c was 11 and most recently 8.5. She has been under adjustment with her diabetic medications. She admits to some dietary indiscretion. Her husband states that she eats less than she used to. She has developed noise on her RV pacing lead. She has not had syncope. Allergies  Allergen Reactions  . Wheat Swelling  . Latex Rash  . Penicillins Rash  . Sulfa Antibiotics Rash     Current Outpatient Prescriptions  Medication Sig Dispense Refill  . acetaminophen (TYLENOL) 500 MG tablet Take 500 mg by mouth every 6 (six) hours as needed for moderate pain.      Marland Kitchen allopurinol (ZYLOPRIM) 100 MG tablet Take 150 mg by mouth every morning.       . benazepril (LOTENSIN) 20 MG tablet Take 10 mg by mouth every morning. Takes 1/2 tablet      . COLCRYS 0.6 MG tablet Take 0.6 mg by mouth daily as needed. For GOUT FLARE      . COREG CR 40 MG 24 hr capsule TAKE ONE CAPSULE BY MOUTH DAILY.  30 capsule  3  . furosemide (LASIX) 80 MG tablet Take 80 mg by mouth daily.      Marland Kitchen HUMULIN N KWIKPEN 100 UNIT/ML Kiwkpen INJECT 34 UNITS SUBCUTANEOUSLY AT BEDTIME.  15 mL  0  . HYDROcodone-acetaminophen (NORCO/VICODIN) 5-325 MG per tablet Take 1-2 tablets by mouth every 6 (six) hours as needed.  13 tablet  0  . insulin aspart (NOVOLOG) 100 UNIT/ML injection Inject 12-20 Units into the skin 3 (three) times daily. Inject 12 units at 8:00am, 16 units at lunch and 20 units  at suppertime      . insulin glargine (LANTUS) 100 UNIT/ML injection Inject 0.3-0.4 mLs (30-40 Units total) into the skin 2 (two) times daily. Inject 40 units in am and 30 units at bedtime  5 pen  5  . insulin NPH (HUMULIN N,NOVOLIN N) 100 UNIT/ML injection Inject 34 Units into the skin at bedtime.       . Insulin Pen Needle (NOVOTWIST) 32G X 5 MM MISC 1 each by Does not apply route daily.  50 each  5  . LANOXIN 125 MCG tablet TAKE 1 TABLET BY MOUTH ON MONDAY THROUGH FRIDAY AS DIRECTED.  20 tablet  1  . Liraglutide (VICTOZA) 18 MG/3ML SOPN Inject 0.6 mg into the skin as directed.  2 pen  5  . NOVOLOG FLEXPEN 100 UNIT/ML FlexPen INJECT SUBCUTANEOUSLY 12 UNITS BEFORE BREAKFAST; 16 UNITS AT LUNCH; AND 20 UNITS AT DINNER.  15 mL  1  . pantoprazole (PROTONIX) 40 MG tablet 1 PO 30 MINS PRIOR TO YOUR FIRST MEAL  30 tablet  11  . spironolactone (ALDACTONE) 25 MG tablet Take 25 mg by mouth every morning.       . warfarin (COUMADIN) 5 MG tablet Take one 1/2 tablet (2.5mg ) everyday except Monday and Thursdays take one whole tablet (5mg )  30 tablet  6   No current facility-administered medications  for this visit.     Past Medical History  Diagnosis Date  . 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 Diabetes mellitus     insulin treated; Glucose-257 in 01/2012   . Congenital third degree heart block     Guidant VVI pacemaker implanted in 09/1997; 8generator change in 01/2004   . Chronic atrial fibrillation     embolic rind  . Cardiomyopathy     with congestive heart failure   ejection fraction of 30% in 09/2003  . Chronic anticoagulation   . Helicobacter pylori gastritis 08/14/2011  . Chronic kidney disease     creatinine- 1.8 in 09/2006 and 2.0 in 05/2007; 2.05 in 2011, 2.29 in 2012  . Dysphagia 08/14/2011    FEB 2013 EGD/DIL 16 MM; GERD; h/o gastroesophageal reflux disease; + H. Pylori gastritis   . Dizziness and giddiness 05/19/2012    ROS:   All systems reviewed and  negative except as noted in the HPI.   Past Surgical History  Procedure Laterality Date  . Insert / replace / remove pacemaker  09/1997 w/gen. change in 01/2004    Guidant VVI [employer]  . Colonoscopy  04/23/2011    [id number] COLON POLYP/ INTERNAL HEMORRHOIDS/ TORTUOUS COLON  . Upper gastrointestinal endoscopy  FEB 2013    RING/DIL TO 16 MM, H PYLORI GASTRITIS     Family History  Problem Relation Age of Onset  . Adopted: Yes  . Colon cancer Neg Hx      History   Social History  . Marital Status: Married    Spouse Name: N/A    Number of Children: 4  . Years of Education: N/A   Occupational History  .     Social History Main Topics  . Smoking status: Never Smoker   . Smokeless tobacco: Never Used  . Alcohol Use: No  . Drug Use: No  . Sexual Activity: Not on file   Other Topics Concern  . Not on file   Social History Narrative  . No narrative on file     BP 137/79  Pulse 66  Ht 5\' 6"  (1.676 m)  Wt 185 lb (83.915 kg)  BMI 29.87 kg/m2  Physical Exam:  Well appearing 53 year old woman,NAD HEENT: Unremarkable Neck:  No JVD, no thyromegally Back:  No CVA tenderness Lungs:  Clear with minimal rales in the bases bilaterally. No wheezes or rhonchi. No increased work of breathing. HEART:  Regular rate rhythm, no murmurs, no rubs, no clicks Abd:  soft, obese,positive bowel sounds, no organomegally, no rebound, no guarding Ext:  2 plus pulses, no edema, no cyanosis, no clubbing Skin:  No rashes no nodules Neuro:  CN II through XII intact, motor grossly intact  DEVICE  Normal device function except for noise on her RV lead.  See PaceArt for details.   Assess/Plan:

## 2013-04-21 NOTE — Interval H&P Note (Signed)
History and Physical Interval Note: Since prior clinic visit, 2D echo demonstrates that her EF is 15-20%.   04/21/2013 8:24 AM  [name] Plummer  has presented today for surgery, with the diagnosis of End of life  The various methods of treatment have been discussed with the patient and family. After consideration of risks, benefits and other options for treatment, the patient has consented to  Procedure(s): BI-VENTRICULAR IMPLANTABLE CARDIOVERTER DEFIBRILLATOR UPGRADE (N/A) as a surgical intervention .  The patient's history has been reviewed, patient examined, no change in status, stable for surgery.  I have reviewed the patient's chart and labs.  Questions were answered to the patient's satisfaction.     Mikle Bosworth.D.

## 2013-04-21 NOTE — Progress Notes (Signed)
Patient's left chest pacer incision has stains from slight oozing this afternoon, and sight without hematoma or puffiness this afternoon.  On shift change report noticed pacer site now puffy, no more ooze noted.  Patient denies intense pain, just some soreness relieved with tylenol.  Dr. Colon Flattery paged and notified and asked to come and assess.  Will continue to monitor.  Sanda Linger

## 2013-04-21 NOTE — Progress Notes (Signed)
UR Completed Adeoluwa Silvers Graves-Bigelow, RN,BSN [phone]  

## 2013-04-21 NOTE — CV Procedure (Signed)
BiV ICD implant via the left subclavian vein without immediate complication. [id number].

## 2013-04-21 NOTE — Discharge Instructions (Addendum)
Supplemental Discharge Instructions for  Pacemaker/Defibrillator Patients  Activity No heavy lifting or vigorous activity with your left/right arm for 6 to 8 weeks.  Do not raise your left/right arm above your head for one week.  Gradually raise your affected arm as drawn below.           02/28                      03/01                       03/02                      03/03       NO DRIVING for 1 week; you may begin driving on [id number]. WOUND CARE   Keep the wound area clean and dry.  Do not get this area wet for one week. No showers for one week; you may shower on 05/01/2013.   The tape/steri-strips on your wound will fall off; do not pull them off.  No bandage is needed on the site.  DO  NOT apply any creams, oils, or ointments to the wound area.   If you notice any drainage or discharge from the wound, any swelling or bruising at the site, or you develop a fever > 101? F after you are discharged home, call the office at once.  Special Instructions   You are still able to use cellular telephones; use the ear opposite the side where you have your pacemaker/defibrillator.  Avoid carrying your cellular phone near your device.   When traveling through airports, show security personnel your identification card to avoid being screened in the metal detectors.  Ask the security personnel to use the hand wand.   Avoid arc welding equipment, MRI testing (magnetic resonance imaging), TENS units (transcutaneous nerve stimulators).  Call the office for questions about other devices.   Avoid electrical appliances that are in poor condition or are not properly grounded.   Microwave ovens are safe to be near or to operate.  Additional information for defibrillator patients should your device go off:   If your device goes off ONCE and you feel fine afterward, notify the device clinic nurses.   If your device goes off ONCE and you do not feel well afterward, call 911.   If your device goes off  TWICE, call 911.   If your device goes off THREE times in one day, call 911.  DO NOT DRIVE YOURSELF OR A FAMILY MEMBER WITH A DEFIBRILLATOR TO THE HOSPITAL--CALL 911.   _____________________________________________________________________________________ Information on my medicine - Coumadin   (Warfarin)  This medication education was reviewed with me or my healthcare representative as part of my discharge preparation.  The pharmacist that spoke with me during my hospital stay was:  Georgina Peer, [facility]  Why was Coumadin prescribed for you? Coumadin was prescribed for you because you have a blood clot or a medical condition that can cause an increased risk of forming blood clots. Blood clots can cause serious health problems by blocking the flow of blood to the heart, lung, or brain. Coumadin can prevent harmful blood clots from forming. As a reminder your indication for Coumadin is:   Stroke Prevention Because Of Atrial Fibrillation  What test will check on my response to Coumadin? While on Coumadin (warfarin) you will need to have an INR test regularly to ensure that your dose is  keeping you in the desired range. The INR (international normalized ratio) number is calculated from the result of the laboratory test called prothrombin time (PT).  If an INR APPOINTMENT HAS NOT ALREADY BEEN MADE FOR YOU please schedule an appointment to have this lab work done by your health care provider within 7 days. Your INR goal is usually a number between:  2 to 3.   What  do you need to  know  About  COUMADIN? Take Coumadin (warfarin) exactly as prescribed by your healthcare provider about the same time each day.  DO NOT stop taking without talking to the doctor who prescribed the medication.  Stopping without other blood clot prevention medication to take the place of Coumadin may increase your risk of developing a new clot or stroke.  Get refills before you run out.  What do you do if you miss a  dose? If you miss a dose, take it as soon as you remember on the same day then continue your regularly scheduled regimen the next day.  Do not take two doses of Coumadin at the same time.  Important Safety Information A possible side effect of Coumadin (Warfarin) is an increased risk of bleeding. You should call your healthcare provider right away if you experience any of the following:   Bleeding from an injury or your nose that does not stop.   Unusual colored urine (red or dark brown) or unusual colored stools (red or black).   Unusual bruising for unknown reasons.   A serious fall or if you hit your head (even if there is no bleeding).  Some foods or medicines interact with Coumadin (warfarin) and might alter your response to warfarin. To help avoid this:   Eat a balanced diet, maintaining a consistent amount of Vitamin K.   Notify your provider about major diet changes you plan to make.   Avoid alcohol or limit your intake to 1 drink for women and 2 drinks for men per day. (1 drink is 5 oz. wine, 12 oz. beer, or 1.5 oz. liquor.)  Make sure that ANY health care provider who prescribes medication for you knows that you are taking Coumadin (warfarin).  Also make sure the healthcare provider who is monitoring your Coumadin knows when you have started a new medication including herbals and non-prescription products.  Coumadin (Warfarin)  Major Drug Interactions  Increased Warfarin Effect Decreased Warfarin Effect  Alcohol (large quantities) Antibiotics (esp. Septra/Bactrim, Flagyl, Cipro) Amiodarone (Cordarone) Aspirin (ASA) Cimetidine (Tagamet) Megestrol (Megace) NSAIDs (ibuprofen, naproxen, etc.) Piroxicam (Feldene) Propafenone (Rythmol SR) Propranolol (Inderal) Isoniazid (INH) Posaconazole (Noxafil) Barbiturates (Phenobarbital) Carbamazepine (Tegretol) Chlordiazepoxide (Librium) Cholestyramine (Questran) Griseofulvin Oral Contraceptives Rifampin Sucralfate  (Carafate) Vitamin K   Coumadin (Warfarin) Major Herbal Interactions  Increased Warfarin Effect Decreased Warfarin Effect  Garlic Ginseng Ginkgo biloba Coenzyme Q10 Green tea St. Johns wort    Coumadin (Warfarin) FOOD Interactions  Eat a consistent number of servings per week of foods HIGH in Vitamin K (1 serving =  cup)  Collards (cooked, or boiled & drained) Kale (cooked, or boiled & drained) Mustard greens (cooked, or boiled & drained) Parsley *serving size only =  cup Spinach (cooked, or boiled & drained) Swiss chard (cooked, or boiled & drained) Turnip greens (cooked, or boiled & drained)  Eat a consistent number of servings per week of foods MEDIUM-HIGH in Vitamin K (1 serving = 1 cup)  Asparagus (cooked, or boiled & drained) Broccoli (cooked, boiled & drained, or raw & chopped) Aud[place] Nine  sprouts (cooked, or boiled & drained) *serving size only =  cup Lettuce, raw (green leaf, endive, romaine) Spinach, raw Turnip greens, raw & chopped   These websites have more information on Coumadin (warfarin):  FailFactory.se; VeganReport.com.au;

## 2013-04-21 NOTE — Discharge Summary (Signed)
ELECTROPHYSIOLOGY PROCEDURE DISCHARGE SUMMARY    Patient ID: [name],  MRN: [id number], DOB/AGE: [id number] y.o.  Admit date: 04/21/2013 Discharge date: 04/22/2013  Primary Care Physician: [name], MD Primary Cardiologist: Bronson Ing Electrophysiologist: Lovena Le  Primary Discharge Diagnosis:  Non ischemic cardiomyopathy, congestive heart failure and CHB status post upgrade of previously implanted pacemaker to CRTD this admission  Secondary Discharge Diagnosis:  1.  Diabetes mellitus 2.  Chronic kidney disease 3.  Chronic atrial fibrillation   Procedures This Admission:  1.  Upgrade of a previously implanted single chamber pacemaker to a CRTD on 04-21-13 by Dr Lovena Le. Please see operative report for full details of leads used. [place]  [employer] [place] CRT D biventricular ICD, serial [id number]. There were no immediate complications. 2.  CXR on 04/22/2013 demonstrated no pneumothorax status post device implantation.   Brief HPI: [name] is a 53 y.o. female with a past medical history significant for complete heart block, cardiomyopathy, and congestive heart failure.  She has had a decline in functional status with worsening of congestive heart failure that correlates with cardiomyopathy.  She was evaluated by Dr Lovena Le in the outpatient setting and upgrade of her previously implanted pacemaker to a CRTD was recommended.  Because of noise on RV lead, RV lead revision was also recommended.  Risks, benefits, and alternatives to ICD implantation were reviewed with the patient who wished to proceed.   Hospital Course:  The patient was admitted and underwent implantation of a BSX CRTD with details as outlined above.   She was monitored on telemetry overnight which demonstrated no arrhythmias.  Left chest was without hematoma or ecchymosis.  The device was interrogated and found to be functioning normally.  CXR was obtained and demonstrated no pneumothorax status post  device implantation.  Wound care, arm mobility, and restrictions were reviewed with the patient.  Dr Lovena Le examined the patient and considered them stable for discharge to home. Of note, the patient's discharge medications include an ACE-I (Benazepril) and beta blocker (Carvedilol).   Discharge Vitals: Blood pressure 114/63, pulse 60, temperature 98 F (36.7 C), temperature source Oral, resp. rate 18, height 5\' 6"  (1.676 m), weight 178 lb 5.6 oz (80.899 kg), SpO2 100.00%.   Labs: Lab Results  Component Value Date   WBC 7.1 04/19/2013   HGB 13.2 04/19/2013   HCT 41.3 04/19/2013   MCV 83.1 04/19/2013   PLT 172 04/19/2013     Recent Labs Lab 04/19/13 1309  NA 140  K 4.6  CL 100  CO2 28  BUN 59*  CREATININE 2.34*  CALCIUM 9.4  GLUCOSE 54*    Discharge Medications:    Medication List         acetaminophen 500 MG tablet  Commonly known as:  TYLENOL  Take 500 mg by mouth every 6 (six) hours as needed for moderate pain.     allopurinol 100 MG tablet  Commonly known as:  ZYLOPRIM  Take 150 mg by mouth every morning.     benazepril 20 MG tablet  Commonly known as:  LOTENSIN  Take 10 mg by mouth every morning. Takes 1/2 tablet     carvedilol 40 MG 24 hr capsule  Commonly known as:  COREG CR  Take 40 mg by mouth daily.     COLCRYS 0.6 MG tablet  Generic drug:  colchicine  Take 0.6 mg by mouth daily as needed (gout flare).     digoxin 0.125 MG tablet  Commonly known as:  LANOXIN  Take 0.125 mg by mouth daily. Every day except Saturday and sunday     HUMULIN N KWIKPEN 100 UNIT/ML Kiwkpen  Generic drug:  Insulin Isophane Human  Inject 30 Units into the skin at bedtime.     LANTUS SOLOSTAR 100 UNIT/ML Solostar Pen  Generic drug:  Insulin Glargine  Inject 30-34 Units into the skin 2 (two) times daily. 34 units in the morning and 30 units at bedtime     NOVOLOG FLEXPEN 100 UNIT/ML FlexPen  Generic drug:  insulin aspart  Inject 12-16 Units into the skin 3 (three) times  daily with meals. 12 units in the morning,14 units at lunch and 16 units at supper     pantoprazole 40 MG tablet  Commonly known as:  PROTONIX  Take 40 mg by mouth daily. 30 mins prior to first meal     spironolactone 25 MG tablet  Commonly known as:  ALDACTONE  Take 25 mg by mouth every morning.     VICTOZA 18 MG/3ML Sopn  Generic drug:  Liraglutide  Inject 0.6 mg into the skin daily with supper.     warfarin 5 MG tablet  Commonly known as:  COUMADIN  Take 2.5-5 mg by mouth daily. Take 1 tablet (5 mg) on Mondays and thursdays and all other days half tablet (2.5 mg)       Disposition:  Stable  Follow-up: Follow-up Information   Follow up with [facility] On 04/29/2013. (At 4:30 PM for wound check)    Specialty:  Cardiology   Contact information:   [address], [place] [address] [phone]      Follow up with [facility] On 07/26/2013. (At 10:00 AM to see Dr. Lovena Le)    Specialty:  Cardiology   Contact information:   [place] [address] [phone]     Duration of Discharge Encounter: Greater than 30 minutes including physician time.  Manson Passey, PA-C 04/22/2013 10:16 AM   EP Attending  Patient seen and examined. Agree with above. She has a small hematoma and would like to hold coumadin for 4 days, restarting on Monday, March 2. Usual followup.  Mikle Bosworth.D.

## 2013-04-21 NOTE — H&P (Signed)
  ICD Criteria  Current LVEF:15% ;Obtained < 1 month ago.  NYHA Functional Classification: Class II  Heart Failure History:  Yes, Duration of heart failure since onset is > 9 months  Non-Ischemic Dilated Cardiomyopathy History:  Yes, timeframe is > 9 months  Atrial Fibrillation/Atrial Flutter:  Yes, A-Fib/A-Flutter type: Permanent (>1 year).  Ventricular Tachycardia History:  No.  Cardiac Arrest History:  No  History of Syndromes with Risk of Sudden Death:  No.  Previous ICD:  No.  Electrophysiology Study: No.  Prior MI: No.  PPM: Yes, PPM type is: Ventricular Chamber.  OSA:  No  Patient Life Expectancy of >=1 year: Yes.  Anticoagulation Therapy:  Patient is on anticoagulation therapy, anticoagulation was held prior to procedure.   Beta Blocker Therapy:  Yes.   Ace Inhibitor/ARB Therapy:  Yes.

## 2013-04-21 NOTE — Interval H&P Note (Signed)
History and Physical Interval Note: In the interim, her echo demonstrates that her EF is 15%.   04/21/2013 8:33 AM  [name]  has presented today for surgery, with the diagnosis of End of life  The various methods of treatment have been discussed with the patient and family. After consideration of risks, benefits and other options for treatment, the patient has consented to  Procedure(s): BI-VENTRICULAR IMPLANTABLE CARDIOVERTER DEFIBRILLATOR UPGRADE (N/A) as a surgical intervention .  The patient's history has been reviewed, patient examined, no change in status, stable for surgery.  I have reviewed the patient's chart and labs.  Questions were answered to the patient's satisfaction.     [name]

## 2013-04-22 ENCOUNTER — Ambulatory Visit (HOSPITAL_COMMUNITY): Payer: Medicaid Other

## 2013-04-22 ENCOUNTER — Encounter (HOSPITAL_COMMUNITY): Payer: Self-pay | Admitting: Nurse Practitioner

## 2013-04-22 DIAGNOSIS — I428 Other cardiomyopathies: Secondary | ICD-10-CM

## 2013-04-22 LAB — GLUCOSE, CAPILLARY: Glucose-Capillary: 246 mg/dL — ABNORMAL HIGH (ref 70–99)

## 2013-04-22 LAB — PROTIME-INR
INR: 2.28 — ABNORMAL HIGH (ref 0.00–1.49)
Prothrombin Time: 24.4 seconds — ABNORMAL HIGH (ref 11.6–15.2)

## 2013-04-22 MED ORDER — WARFARIN SODIUM 5 MG PO TABS: 2.5000 mg | ORAL_TABLET | Freq: Every day | ORAL | Status: DC

## 2013-04-22 MED ORDER — FUROSEMIDE 80 MG PO TABS: 80.0000 mg | ORAL_TABLET | Freq: Every day | ORAL | Status: DC

## 2013-04-22 NOTE — Op Note (Signed)
[name], [name] NO.:  [id number]  MEDICAL RECORD NO.:  [id number]  LOCATION:  3W31C                        FACILITY:  [facility]  PHYSICIAN:  Champ Mungo. Lovena Le, MD    DATE OF BIRTH:  12/14/1960  DATE OF PROCEDURE:  04/21/2013 DATE OF DISCHARGE:                              OPERATIVE REPORT   PROCEDURE PERFORMED:  Removal of a previously implanted single-chamber pacemaker and insertion of a biventricular ICD in a patient with a nonischemic cardiomyopathy, chronic systolic heart failure, ejection fraction 15% despite maximal medical therapy with her heart failure being been present for over 10 years.  INTRODUCTION:  The patient is a very pleasant 53 year old woman with congenital complete heart block who underwent permanent pacemaker insertion 16 years ago.  She has underwent one generator change.  She has developed progressive heart failure symptoms and also been noted to have noise on her ventricular pacing lead.  Her repeat echo done in the last 30 days demonstrates an EF of 15%.  Previously, her ejection fraction was 20%.  Her heart failure was initially class I despite her severe left ventricular dysfunction, it is now class II.  She has pacing induced bundle-branch block with a QRS duration of 180 milliseconds, and for all of the above is now referred for insertion of a biventricular ICD.  PROCEDURE:  After informed consent was obtained, the patient was taken to the diagnostic EP lab in a fasting state.  After usual preparation and draping, intravenous fentanyl and midazolam was given for sedation. 10 mL of IV contrast was injected into the left upper extremity venous system.  It appeared that the vein was open.  However, the more proximal part of the vein where the innominate vein came in were not well visualized.  At this point, 30 mL of lidocaine was infiltrated in the left infraclavicular region from the old pacemaker insertion site and a 7-cm incision  was carried out over this region.  Electrocautery was utilized to dissect down to the fascial plane.  The left subclavian vein was then punctured.  Initial attempts at advancing the guidewire were unsuccessful.  An angioplasty glidewire was then utilized and after approximately 15 minutes of manipulation of the glidewire, the stenosis was traversed and the glidewire was advanced into the central circulation.  This was carried out the second time.  With 2 glide wires in the right atrium, the veins were dilated.  Having accomplished this, a long peel-away sheath was advanced over the guidewire beyond both the initial stenosis and occlusion of the vein and beyond the second occlusion of the vein graft right at the innominate vein superior vena cava junction.  Additional attempts at advancing the [place] defibrillator lead were unsuccessful, and a smaller 8-French St. Jude defibrillator lead was utilized, serial number [id number] was advanced through the guiding catheter and into the right ventricle.  At the final site, on the RV septum, the R-waves (paced) were 11 mV and with the lead actively fixed the pacing impedance was 500 ohms.  There was a large injury current.  10 V pacing did not stimulate the diaphragm.  With the defibrillator lead in satisfactory position, attention was then turned  to placement of a left ventricular pacing lead.  The guiding catheter and the 6-French hexapolar EP catheter were inserted into the right subclavian vein and advanced under fluoroscopic guidance into the right atrium.  The coronary sinus was cannulated with modest difficulty. Venography of the coronary sinus was then carried out demonstrating a lateral vein, which was utilized for LV lead placement.  The vein was cannulated without difficulty and the Guidant Easy Track 2 bipolar defibrillator lead, serial [id number] was advanced over guiding catheter and into the lateral vein in the left  ventricle.  The lead was placed approximately one-third from base to apex.  The lead appeared to be quite stable.  The guiding catheter was liberated in the usual manner and the lead continued to have hemostasis.  At this point, the [employer] CRT D biventricular ICD, serial [id number] was connected to the newly-implanted defibrillator lead and the newly implanted left ventricular lead and placed back in the subcutaneous pocket.  The old unipolar pacing lead was capped.  The old Chemical engineer single- chamber generator was removed and the old unipolar lead was capped.  At this point, the device was connected to the LV and RV leads and placed back in the subcutaneous pocket.  The pocket was irrigated with antibiotic irrigation and the incision was closed with 2-0 and 3-0 Vicryl suture.  Benzoin and Steri-Strips were painted on the skin and a pressure dressing applied and the patient was returned to her room in satisfactory condition.  COMPLICATIONS:  There were no immediate procedure complications.  RESULTS:  This demonstrates successful implantation of a Chemical engineer biventricular ICD followed by removal of a Engineer, production pacemaker capping of the old RV pacing lead and utilizing contrast venography of the left upper extremity as well as the coronary sinus.  It should also be noted that the pocket was revised to accommodate the larger device.     Champ Mungo. Lovena Le, MD     GWT/MEDQ  D:  04/21/2013  T:  04/22/2013  Job:  [id number]

## 2013-04-23 ENCOUNTER — Encounter (HOSPITAL_COMMUNITY): Payer: Self-pay | Admitting: *Deleted

## 2013-04-29 ENCOUNTER — Ambulatory Visit (INDEPENDENT_AMBULATORY_CARE_PROVIDER_SITE_OTHER): Payer: Medicaid Other | Admitting: *Deleted

## 2013-04-29 ENCOUNTER — Encounter: Payer: Self-pay | Admitting: Internal Medicine

## 2013-04-29 DIAGNOSIS — I428 Other cardiomyopathies: Secondary | ICD-10-CM

## 2013-04-29 DIAGNOSIS — I4891 Unspecified atrial fibrillation: Secondary | ICD-10-CM

## 2013-04-29 DIAGNOSIS — Q246 Congenital heart block: Secondary | ICD-10-CM

## 2013-04-29 DIAGNOSIS — Z01818 Encounter for other preprocedural examination: Secondary | ICD-10-CM

## 2013-04-29 DIAGNOSIS — I5022 Chronic systolic (congestive) heart failure: Secondary | ICD-10-CM

## 2013-04-29 LAB — MDC_IDC_ENUM_SESS_TYPE_INCLINIC
Brady Statistic RV Percent Paced: 100 %
Date Time Interrogation Session: 20150305050000
HIGH POWER IMPEDANCE MEASURED VALUE: 64 Ohm
Implantable Pulse Generator Serial Number: 114945
Lead Channel Impedance Value: 549 Ohm
Lead Channel Pacing Threshold Amplitude: 0.5 V
Lead Channel Pacing Threshold Pulse Width: 0.5 ms
Lead Channel Pacing Threshold Pulse Width: 0.5 ms
Lead Channel Sensing Intrinsic Amplitude: 8.3 mV
Lead Channel Setting Pacing Amplitude: 3.5 V
Lead Channel Setting Pacing Amplitude: 3.5 V
Lead Channel Setting Pacing Pulse Width: 0.5 ms
Lead Channel Setting Pacing Pulse Width: 0.5 ms
Lead Channel Setting Sensing Sensitivity: 1 mV
MDC IDC MSMT LEADCHNL LV IMPEDANCE VALUE: 441 Ohm
MDC IDC MSMT LEADCHNL RA IMPEDANCE VALUE: 2000 Ohm — AB
MDC IDC MSMT LEADCHNL RV PACING THRESHOLD AMPLITUDE: 0.5 V
MDC IDC MSMT LEADCHNL RV SENSING INTR AMPL: 22.4 mV
MDC IDC SET LEADCHNL RV SENSING SENSITIVITY: 0.6 mV
Zone Setting Detection Interval: 300 ms
Zone Setting Detection Interval: 353 ms

## 2013-04-29 NOTE — Progress Notes (Signed)
Wound check appointment. Steri-strips removed. Wound without redness. Hematoma present + Warfarin---held 2 days prior to procedure. Bleeding was noted at incision edges---steri strips applied. GT observed and decreased Warfarin to (1/2) tablet qd until pt's INR on Monday (3-9). Normal device function. Thresholds, sensing, and impedances consistent with implant measurements. Device programmed at 3.5V for extra safety margin until 3 month visit. Histogram distribution appropriate for patient and level of activity. Permanent AF + Warfarin. No ventricular arrhythmias noted. Patient educated about wound care, arm mobility, lifting restrictions, shock plan. ROV in 2 weeks for a wound recheck and on 6-1 with GT.

## 2013-05-03 ENCOUNTER — Ambulatory Visit (INDEPENDENT_AMBULATORY_CARE_PROVIDER_SITE_OTHER): Payer: Medicaid Other | Admitting: *Deleted

## 2013-05-03 DIAGNOSIS — Z5181 Encounter for therapeutic drug level monitoring: Secondary | ICD-10-CM | POA: Diagnosis not present

## 2013-05-03 DIAGNOSIS — I4891 Unspecified atrial fibrillation: Secondary | ICD-10-CM

## 2013-05-03 LAB — POCT INR: INR: 1.8

## 2013-05-06 ENCOUNTER — Other Ambulatory Visit: Payer: Self-pay | Admitting: Cardiology

## 2013-05-12 ENCOUNTER — Ambulatory Visit (INDEPENDENT_AMBULATORY_CARE_PROVIDER_SITE_OTHER): Payer: Medicaid Other | Admitting: *Deleted

## 2013-05-12 DIAGNOSIS — Q246 Congenital heart block: Secondary | ICD-10-CM

## 2013-05-12 DIAGNOSIS — I4891 Unspecified atrial fibrillation: Secondary | ICD-10-CM

## 2013-05-12 DIAGNOSIS — I428 Other cardiomyopathies: Secondary | ICD-10-CM

## 2013-05-12 NOTE — Patient Instructions (Signed)
Patient presents to the office for a wound recheck for hematoma. Patient's incisions site appears to be healthy. Hematoma is still present and appears unchanged from previous check. Per GT patient is to remain of 1/2 tablet of Warfarin until coumadin check on 3-27. Nevin Bloodgood will recheck patient's hematoma in [place] on that date along with GT. Patient voiced understanding.

## 2013-05-19 ENCOUNTER — Telehealth: Payer: Self-pay | Admitting: *Deleted

## 2013-05-19 MED ORDER — DIGOXIN 125 MCG PO TABS: 0.1250 mg | ORAL_TABLET | Freq: Every day | ORAL | Status: DC

## 2013-05-19 NOTE — Telephone Encounter (Signed)
Medication sent via escribe.  

## 2013-05-19 NOTE — Telephone Encounter (Signed)
[facility] [place] [phone]

## 2013-05-21 ENCOUNTER — Ambulatory Visit (INDEPENDENT_AMBULATORY_CARE_PROVIDER_SITE_OTHER): Payer: BC Managed Care – PPO | Admitting: *Deleted

## 2013-05-21 ENCOUNTER — Ambulatory Visit (INDEPENDENT_AMBULATORY_CARE_PROVIDER_SITE_OTHER): Payer: BC Managed Care – PPO | Admitting: Internal Medicine

## 2013-05-21 ENCOUNTER — Encounter: Payer: Self-pay | Admitting: Internal Medicine

## 2013-05-21 VITALS — Ht 66.0 in | Wt 189.0 lb

## 2013-05-21 DIAGNOSIS — Z5181 Encounter for therapeutic drug level monitoring: Secondary | ICD-10-CM

## 2013-05-21 DIAGNOSIS — I5022 Chronic systolic (congestive) heart failure: Secondary | ICD-10-CM

## 2013-05-21 DIAGNOSIS — I4891 Unspecified atrial fibrillation: Secondary | ICD-10-CM

## 2013-05-21 DIAGNOSIS — Z95810 Presence of automatic (implantable) cardiac defibrillator: Secondary | ICD-10-CM

## 2013-05-21 LAB — POCT INR: INR: 1.9

## 2013-05-21 NOTE — Assessment & Plan Note (Signed)
Her [place] [employer] BiV ICD appears to be working normally. Will recheck in several months.

## 2013-05-21 NOTE — Progress Notes (Signed)
HPI [name] returns today for followup. She is a pleasant 53 yo woman with congenital CHB, s/p PPM who developed a non-ischemic CM, Chronic systolic CHF, EF [id number], s/p BiV ICD implant. Her post op course has been complicated by the development of a pocket hematoma. She denies fever, chills, drainage or erythema around her incision. Allergies  Allergen Reactions  . Wheat Swelling  . Latex Rash  . Penicillins Rash  . Sulfa Antibiotics Rash     Current Outpatient Prescriptions  Medication Sig Dispense Refill  . acetaminophen (TYLENOL) 500 MG tablet Take 500 mg by mouth every 6 (six) hours as needed for moderate pain.      Marland Kitchen allopurinol (ZYLOPRIM) 100 MG tablet Take 150 mg by mouth every morning.       . benazepril (LOTENSIN) 20 MG tablet Take 10 mg by mouth every morning. Takes 1/2 tablet      . carvedilol (COREG CR) 40 MG 24 hr capsule Take 40 mg by mouth daily.      Marland Kitchen COLCRYS 0.6 MG tablet Take 0.6 mg by mouth daily as needed (gout flare).       Marland Kitchen digoxin (LANOXIN) 0.125 MG tablet Take 1 tablet (0.125 mg total) by mouth daily. Every day except Saturday and sunday  30 tablet  6  . furosemide (LASIX) 80 MG tablet TAKE (1) TABLET BY MOUTH TWICE DAILY.  60 tablet  3  . insulin aspart (NOVOLOG FLEXPEN) 100 UNIT/ML FlexPen Inject 12-16 Units into the skin 3 (three) times daily with meals. 12 units in the morning,14 units at lunch and 16 units at supper      . Insulin Glargine (LANTUS SOLOSTAR) 100 UNIT/ML Solostar Pen Inject 30-34 Units into the skin 2 (two) times daily. 34 units in the morning and 30 units at bedtime      . Insulin Isophane Human (HUMULIN N KWIKPEN) 100 UNIT/ML Kiwkpen Inject 30 Units into the skin at bedtime.       . Liraglutide (VICTOZA) 18 MG/3ML SOPN Inject 0.6 mg into the skin daily with supper.       . pantoprazole (PROTONIX) 40 MG tablet Take 40 mg by mouth daily. 30 mins prior to first meal      . spironolactone (ALDACTONE) 25 MG tablet Take 25 mg by mouth  every morning.       . warfarin (COUMADIN) 5 MG tablet Take 0.5-1 tablets (2.5-5 mg total) by mouth daily. Take 1 tablet (5 mg) on Mondays and thursdays and all other days half tablet (2.5 mg). HOLD for 4 days. Restart 04/26/2013.       No current facility-administered medications for this visit.     Past Medical History  Diagnosis Date  . Congenital third degree heart block     a. Guidant VVI pacemaker implanted in 09/1997; b. gen change in 01/2004;  c. 03/2013 upgrade to [place], ser# [id number].  Marland Kitchen Chronic atrial fibrillation     embolic rind  . Nonischemic cardiomyopathy     a. 03/2013 Echo: Sev LV dysfxn with sev diff HK, restrictive phys, diast dysfxn, mild MR, sev dil LA/RA, mod PR, PASP 42mmHg;  b. 03/2013: BSX Energen CRTD BiV ICC, ser# [id number].  Marland Kitchen Chronic anticoagulation     a. coumadin.  Marland Kitchen Helicobacter pylori gastritis 08/14/2011  . Dysphagia 08/14/2011    FEB 2013 EGD/DIL 16 MM; GERD; h/o gastroesophageal reflux disease; + H. Pylori gastritis   . Dizziness and giddiness 05/19/2012  .  Automatic implantable cardioverter-defibrillator in situ     a. 03/2013: BSX Energen CRTD BiV ICC, ser# [id number]  . Chronic kidney disease     creatinine- 1.8 in 09/2006 and 2.0 in 05/2007; 2.05 in 2011, 2.29 in 2012  . Kidney stones 1990's  . Chronic systolic CHF (congestive heart failure)     a. 03/2013 Echo: Sev LV dysfxn with sev diff HK, restrictive phys, diast dysfxn, mild MR, sev dil LA/RA, mod PR, PASP 67mmHg.  Marland Kitchen IDDM (insulin dependent diabetes mellitus)   . GERD (gastroesophageal reflux disease)   . Stroke 1999    denies residual on 04/21/2013    ROS:   All systems reviewed and negative except as noted in the HPI.   Past Surgical History  Procedure Laterality Date  . Colonoscopy  04/23/2011    [id number] COLON POLYP/ INTERNAL HEMORRHOIDS/ TORTUOUS COLON  . Upper gastrointestinal endoscopy  FEB 2013    RING/DIL TO 16 MM, H PYLORI GASTRITIS  . Bi-ventricular implantable  cardioverter defibrillator  (crt-d)  04/21/2013  . Pacemaker generator change  01/2004  . Insert / replace / remove pacemaker  09/1997    Guidant VVI [employer]     Family History  Problem Relation Age of Onset  . Adopted: Yes  . Colon cancer Neg Hx      History   Social History  . Marital Status: Married    Spouse Name: N/A    Number of Children: 4  . Years of Education: N/A   Occupational History  .     Social History Main Topics  . Smoking status: Never Smoker   . Smokeless tobacco: Never Used  . Alcohol Use: No  . Drug Use: No  . Sexual Activity: Yes   Other Topics Concern  . Not on file   Social History Narrative  . No narrative on file     Ht 5\' 6"  (1.676 m)  Wt 189 lb (85.73 kg)  BMI 30.52 kg/m2  Physical Exam:  Well appearing middle aged woman, NAD HEENT: Unremarkable Neck:  No JVD, no thyromegally Back:  No CVA tenderness Lungs:  Clear with no wheezes, moderate pocket hematoma, slightly less swollen than on her visit a week ago. HEART:  Regular rate rhythm, no murmurs, no rubs, no clicks Abd:  soft, positive bowel sounds, no organomegally, no rebound, no guarding Ext:  2 plus pulses, no edema, no cyanosis, no clubbing Skin:  No rashes no nodules Neuro:  CN II through XII intact, motor grossly intact  Assess/Plan:

## 2013-05-21 NOTE — Patient Instructions (Signed)
Your physician recommends that you schedule a follow-up appointment in: 06-04-13 with Dr Lovena Le

## 2013-05-21 NOTE — Assessment & Plan Note (Signed)
Her CHF symptoms remain class 2. She will continue her current meds.

## 2013-05-27 ENCOUNTER — Other Ambulatory Visit: Payer: Medicaid Other

## 2013-05-31 ENCOUNTER — Other Ambulatory Visit: Payer: Self-pay | Admitting: *Deleted

## 2013-05-31 ENCOUNTER — Other Ambulatory Visit: Payer: Medicaid Other

## 2013-05-31 ENCOUNTER — Other Ambulatory Visit: Payer: Self-pay | Admitting: Endocrinology

## 2013-05-31 DIAGNOSIS — E1165 Type 2 diabetes mellitus with hyperglycemia: Principal | ICD-10-CM

## 2013-05-31 DIAGNOSIS — E1129 Type 2 diabetes mellitus with other diabetic kidney complication: Secondary | ICD-10-CM

## 2013-05-31 LAB — HEMOGLOBIN A1C
Hgb A1c MFr Bld: 8.5 % — ABNORMAL HIGH (ref <5.7)
Mean Plasma Glucose: 197 mg/dL — ABNORMAL HIGH (ref <117)

## 2013-06-01 LAB — COMPREHENSIVE METABOLIC PANEL
ALBUMIN: 3.9 g/dL (ref 3.5–5.2)
ALT: 31 U/L (ref 0–35)
AST: 28 U/L (ref 0–37)
Alkaline Phosphatase: 177 U/L — ABNORMAL HIGH (ref 39–117)
BUN: 50 mg/dL — ABNORMAL HIGH (ref 6–23)
CALCIUM: 8.6 mg/dL (ref 8.4–10.5)
CHLORIDE: 97 meq/L (ref 96–112)
CO2: 28 mEq/L (ref 19–32)
CREATININE: 2.04 mg/dL — AB (ref 0.50–1.10)
Glucose, Bld: 82 mg/dL (ref 70–99)
Potassium: 5.3 mEq/L (ref 3.5–5.3)
Sodium: 139 mEq/L (ref 135–145)
Total Bilirubin: 0.9 mg/dL (ref 0.2–1.2)
Total Protein: 7.3 g/dL (ref 6.0–8.3)

## 2013-06-02 ENCOUNTER — Ambulatory Visit (INDEPENDENT_AMBULATORY_CARE_PROVIDER_SITE_OTHER): Payer: BC Managed Care – PPO | Admitting: Endocrinology

## 2013-06-02 ENCOUNTER — Encounter: Payer: Self-pay | Admitting: Endocrinology

## 2013-06-02 VITALS — BP 118/78 | HR 88 | Temp 97.9°F | Resp 14 | Ht 65.0 in | Wt 186.0 lb

## 2013-06-02 DIAGNOSIS — E785 Hyperlipidemia, unspecified: Secondary | ICD-10-CM

## 2013-06-02 DIAGNOSIS — N184 Chronic kidney disease, stage 4 (severe): Secondary | ICD-10-CM

## 2013-06-02 DIAGNOSIS — E1129 Type 2 diabetes mellitus with other diabetic kidney complication: Secondary | ICD-10-CM

## 2013-06-02 DIAGNOSIS — E1165 Type 2 diabetes mellitus with hyperglycemia: Principal | ICD-10-CM

## 2013-06-02 NOTE — Patient Instructions (Addendum)
Please check blood sugars at least half the time about 2 hours after any meal and every 2 days on waking up.  Please bring blood sugar monitor to each visit  Lantus 36 in am, rest same  Victoza 5 clicks beyond 0.6mg  dose

## 2013-06-02 NOTE — Progress Notes (Addendum)
Patient ID: [name], female   DOB: [birth year], 53 y.o.   MRN: [id number]   Reason for Appointment : Follow up for Type 1 Diabetes  History of Present Illness           Diagnosis: Type 2 diabetes mellitus, date of diagnosis: 1990        Past history: She has been on insulin since 2007. Generally has had poor control in requiring large doses of insulin. Blood sugars also have been previously variable making her control difficult. She has had difficulty following instructions for mealtime insulin consistently in the past  Has been on a complicated regimen of Lantus twice a day, NovoLog a.c. and NPH at bedtime  INSULIN regimen is described as: Lantus a.m. 34 - 30 in PM. Humulin N 30 hs, NovoLog a.c.12- 14-16  Recent history: She has been on Victoza in 11/2012 because of persistently poor control and using large doses of insulin.  Also was tending to have relatively high postprandial readings with significant variability She has had  overall better readings with adding Victoza even with only 0.6 mg, did have some diarrhea with this initially Is taking  lower dose of LANTUS insulin now and somewhat less NPH  Her doses were reduced further on her last visit Glucose patterns: Now she appears to be having inconsistent readings and some significantly high readings in the mornings which she cannot explain Overall blood sugars in the mornings are fluctuating including a couple of readings in the 60s She has not checked readings after breakfast or lunch and may be getting higher then Occasionally will miss her lunchtime dose and will get a high reading subsequently  Has gained back the weight she had previously lost   Glucose monitoring:  done 1.4 times a day         Glucometer:  Accu-Chek [employer]       Blood Glucose readings from meter download:   PREMEAL Breakfast Lunch Dinner Bedtime Overall  Glucose range:  81-274   225   115-225   160    Mean/median:  142   168   153   Hypoglycemia:   rarely having glucose in the 60s in the morning  Self-care: The diet that the patient has been following is: Small portions Usually   Meals: 3 meals per day. Bfst 9am; lunch 2 pm Supper at about 6-7 PM, mealtimes are variable          Physical activity: exercise: Walking 3/7 days recently         Dietician visit: Most recent:.Several years ago           Wt Readings from Last 3 Encounters:  06/02/13 186 lb (84.369 kg)  05/21/13 189 lb (85.73 kg)  04/22/13 178 lb 5.6 oz (80.899 kg)   Previous A1c results, 9.9, 11.3  Lab Results  Component Value Date   HGBA1C 8.5* 05/31/2013   HGBA1C 8.5* 03/18/2013   HGBA1C 10.7* 12/01/2012   Lab Results  Component Value Date   MICROALBUR 2.4* 03/18/2013   LDLCALC 30 04/18/2009   CREATININE 2.04* 05/31/2013     Lab Results  Component Value Date   MICROALBUR 2.4* 03/18/2013      Medication List       This list is accurate as of: 06/02/13  2:10 PM.  Always use your most recent med list.               acetaminophen 500 MG tablet  Commonly known as:  TYLENOL  Take 500 mg by mouth every 6 (six) hours as needed for moderate pain.     allopurinol 100 MG tablet  Commonly known as:  ZYLOPRIM  Take 150 mg by mouth every morning.     benazepril 20 MG tablet  Commonly known as:  LOTENSIN  Take 10 mg by mouth every morning. Takes 1/2 tablet     carvedilol 40 MG 24 hr capsule  Commonly known as:  COREG CR  Take 40 mg by mouth daily.     COLCRYS 0.6 MG tablet  Generic drug:  colchicine  Take 0.6 mg by mouth daily as needed (gout flare).     digoxin 0.125 MG tablet  Commonly known as:  LANOXIN  Take 1 tablet (0.125 mg total) by mouth daily. Every day except Saturday and sunday     furosemide 80 MG tablet  Commonly known as:  LASIX  TAKE (1) TABLET BY MOUTH TWICE DAILY.     HUMULIN N KWIKPEN 100 UNIT/ML Kiwkpen  Generic drug:  Insulin Isophane Human  Inject 30 Units into the skin at bedtime.     LANTUS SOLOSTAR 100 UNIT/ML Solostar Pen   Generic drug:  Insulin Glargine  Inject 30-34 Units into the skin 2 (two) times daily. 34 units in the morning and 30 units at bedtime     NOVOLOG FLEXPEN 100 UNIT/ML FlexPen  Generic drug:  insulin aspart  Inject 12-16 Units into the skin 3 (three) times daily with meals. 12 units in the morning,14 units at lunch and 16 units at supper     pantoprazole 40 MG tablet  Commonly known as:  PROTONIX  Take 40 mg by mouth daily. 30 mins prior to first meal     spironolactone 25 MG tablet  Commonly known as:  ALDACTONE  Take 25 mg by mouth every morning.     VICTOZA 18 MG/3ML Sopn  Generic drug:  Liraglutide  Inject 0.6 mg into the skin daily with supper.     warfarin 5 MG tablet  Commonly known as:  COUMADIN  Take 2.5-5 mg by mouth daily. Take 1/2 tablet daily        Allergies:  Allergies  Allergen Reactions  . Wheat Swelling  . Latex Rash  . Penicillins Rash  . Sulfa Antibiotics Rash    Past Medical History  Diagnosis Date  . Congenital third degree heart block     a. Guidant VVI pacemaker implanted in 09/1997; b. gen change in 01/2004;  c. 03/2013 upgrade to [place], ser# [id number].  Marland Kitchen Chronic atrial fibrillation     embolic rind  . Nonischemic cardiomyopathy     a. 03/2013 Echo: Sev LV dysfxn with sev diff HK, restrictive phys, diast dysfxn, mild MR, sev dil LA/RA, mod PR, PASP 86mmHg;  b. 03/2013: BSX Energen CRTD BiV ICC, ser# [id number].  Marland Kitchen Chronic anticoagulation     a. coumadin.  Marland Kitchen Helicobacter pylori gastritis 08/14/2011  . Dysphagia 08/14/2011    FEB 2013 EGD/DIL 16 MM; GERD; h/o gastroesophageal reflux disease; + H. Pylori gastritis   . Dizziness and giddiness 05/19/2012  . Automatic implantable cardioverter-defibrillator in situ     a. 03/2013: BSX Energen CRTD BiV ICC, ser# [id number]  . Chronic kidney disease     creatinine- 1.8 in 09/2006 and 2.0 in 05/2007; 2.05 in 2011, 2.29 in 2012  . Kidney stones 1990's  . Chronic systolic CHF (congestive heart  failure)     a. 03/2013 Echo: Sev LV dysfxn  with sev diff HK, restrictive phys, diast dysfxn, mild MR, sev dil LA/RA, mod PR, PASP 56mmHg.  Marland Kitchen IDDM (insulin dependent diabetes mellitus)   . GERD (gastroesophageal reflux disease)   . Stroke 1999    denies residual on 04/21/2013    Past Surgical History  Procedure Laterality Date  . Colonoscopy  04/23/2011    [id number] COLON POLYP/ INTERNAL HEMORRHOIDS/ TORTUOUS COLON  . Upper gastrointestinal endoscopy  FEB 2013    RING/DIL TO 16 MM, H PYLORI GASTRITIS  . Bi-ventricular implantable cardioverter defibrillator  (crt-d)  04/21/2013  . Pacemaker generator change  01/2004  . Insert / replace / remove pacemaker  09/1997    Guidant VVI [employer]    Family History  Problem Relation Age of Onset  . Adopted: Yes  . Colon cancer Neg Hx     Social History:  reports that she has never smoked. She has never used smokeless tobacco. She reports that she does not drink alcohol or use illicit drugs.    Review of Systems:   She has had stable chronic renal insufficiency, followed by nephrologist. Creatinine appears slightly better  She has had cardiomyopathy and atrial fibrillation followed by cardiologist  Lipids: Has dyslipidemia with low HDL, high triglycerides  Lab Results  Component Value Date   CHOL 144 01/27/2013   HDL 23.00* 01/27/2013   LDLCALC 30 04/18/2009   LDLDIRECT 93.1 01/27/2013   TRIG 209.0* 01/27/2013   CHOLHDL 6 01/27/2013     Physical Examination:  BP 118/78  Pulse 88  Temp(Src) 97.9 F (36.6 C)  Resp 14  Ht 5\' 5"  (1.651 m)  Wt 186 lb (84.369 kg)  BMI 30.95 kg/m2  SpO2 98%        no ankle edema  ASSESSMENT:  Diabetes type 1:   Her blood sugars are overall better with using Victoza in addition to her basal bolus insulin regimen  However her A1c still over 8% even though her average glucose at home is 153  Most likely has high postprandial readings which she is not monitoring and also some high  readings in the mornings or unknown reasons even though she thinks she is compliant with her basal insulin doses She thinks she is trying to improve her diet with cutting out late night snacks and also trying to walk but her weight has gone back up  She previously had difficulty using more than 0.6 mg Victoza because of GI side effects  CKD: Stable function  PLAN:   Discussed needing to check blood sugars more consistently after meals including breakfast and lunch More consistent mealtime coverage and adjust the dose based on carbohydrate and total calorie intake For now will increase her Lantus by 2 units in the morning since suppertime readings are mostly high Increase walking for exercise Try a higher dose of Victoza using 0.9 mg Victoza with turning the pen 5 clicks beyond the 0.6 mg  Counseling time over 50% of today's 25 minute visit  [name] 06/02/2013, 2:10 PM

## 2013-06-03 ENCOUNTER — Ambulatory Visit (INDEPENDENT_AMBULATORY_CARE_PROVIDER_SITE_OTHER): Payer: BC Managed Care – PPO | Admitting: *Deleted

## 2013-06-03 DIAGNOSIS — I4891 Unspecified atrial fibrillation: Secondary | ICD-10-CM

## 2013-06-03 DIAGNOSIS — Z5181 Encounter for therapeutic drug level monitoring: Secondary | ICD-10-CM

## 2013-06-03 LAB — POCT INR: INR: 1.5

## 2013-06-04 ENCOUNTER — Encounter: Payer: Self-pay | Admitting: Internal Medicine

## 2013-06-04 ENCOUNTER — Ambulatory Visit (INDEPENDENT_AMBULATORY_CARE_PROVIDER_SITE_OTHER): Payer: BC Managed Care – PPO | Admitting: Internal Medicine

## 2013-06-04 VITALS — BP 119/71 | HR 63 | Ht 66.0 in | Wt 186.4 lb

## 2013-06-04 DIAGNOSIS — I5022 Chronic systolic (congestive) heart failure: Secondary | ICD-10-CM

## 2013-06-04 DIAGNOSIS — I4891 Unspecified atrial fibrillation: Secondary | ICD-10-CM

## 2013-06-04 DIAGNOSIS — Z95810 Presence of automatic (implantable) cardiac defibrillator: Secondary | ICD-10-CM

## 2013-06-04 NOTE — Patient Instructions (Signed)
Your physician recommends that you schedule a follow-up appointment in: 6 months with Dr Knox Saliva will receive a reminder letter two months in advance reminding you to call and schedule your appointment. If you don't receive this letter, please contact our office.  Coumadin go back to regular dose Whole tablet on mon and Thursday and 1/2 tablet tues, weds,fri,sat,sun

## 2013-06-06 ENCOUNTER — Encounter: Payer: Self-pay | Admitting: Internal Medicine

## 2013-06-06 NOTE — Progress Notes (Signed)
HPI Mrs. Spindle returns today for followup. She is a pleasant 53 yo woman with a h/o chronic atrial fib and chb, s/p PPM who underwent upgrade to a BiV ICD several weeks ago. She had class 2b CHF, with pacing induced LBBB and a QRS duration of 170 ms. She had her procedure complicated by a large hematoma and we held her coumadin then started it back at a lower dose. He hematoma has resolved. She denies any fevers, chills, or other infectious symptoms. She feels well. Her husband states (as does the patient) that her energy level is better. Allergies  Allergen Reactions  . Wheat Swelling  . Latex Rash  . Penicillins Rash  . Sulfa Antibiotics Rash     Current Outpatient Prescriptions  Medication Sig Dispense Refill  . acetaminophen (TYLENOL) 500 MG tablet Take 500 mg by mouth every 6 (six) hours as needed for moderate pain.      Marland Kitchen allopurinol (ZYLOPRIM) 100 MG tablet Take 150 mg by mouth every morning.       . benazepril (LOTENSIN) 20 MG tablet Take 10 mg by mouth every morning. Takes 1/2 tablet      . carvedilol (COREG CR) 40 MG 24 hr capsule Take 40 mg by mouth daily.      Marland Kitchen COLCRYS 0.6 MG tablet Take 0.6 mg by mouth daily as needed (gout flare).       Marland Kitchen digoxin (LANOXIN) 0.125 MG tablet Take 1 tablet (0.125 mg total) by mouth daily. Every day except Saturday and sunday  30 tablet  6  . furosemide (LASIX) 80 MG tablet TAKE (1) TABLET BY MOUTH TWICE DAILY.  60 tablet  3  . insulin aspart (NOVOLOG FLEXPEN) 100 UNIT/ML FlexPen Inject 12-16 Units into the skin 3 (three) times daily with meals. 12 units in the morning,14 units at lunch and 16 units at supper      . Insulin Glargine (LANTUS SOLOSTAR) 100 UNIT/ML Solostar Pen Inject 30-34 Units into the skin 2 (two) times daily. 34 units in the morning and 30 units at bedtime      . Insulin Isophane Human (HUMULIN N KWIKPEN) 100 UNIT/ML Kiwkpen Inject 30 Units into the skin at bedtime.       . Liraglutide (VICTOZA) 18 MG/3ML SOPN Inject  0.6 mg into the skin daily with supper.       . pantoprazole (PROTONIX) 40 MG tablet Take 40 mg by mouth daily. 30 mins prior to first meal      . spironolactone (ALDACTONE) 25 MG tablet Take 25 mg by mouth every morning.       . warfarin (COUMADIN) 5 MG tablet Take 2.5-5 mg by mouth daily. Take 1/2 tablet daily       No current facility-administered medications for this visit.     Past Medical History  Diagnosis Date  . Congenital third degree heart block     a. Guidant VVI pacemaker implanted in 09/1997; b. gen change in 01/2004;  c. 03/2013 upgrade to [place], ser# [id number].  Marland Kitchen Chronic atrial fibrillation     embolic rind  . Nonischemic cardiomyopathy     a. 03/2013 Echo: Sev LV dysfxn with sev diff HK, restrictive phys, diast dysfxn, mild MR, sev dil LA/RA, mod PR, PASP 27mmHg;  b. 03/2013: BSX Energen CRTD BiV ICC, ser# [id number].  Marland Kitchen Chronic anticoagulation     a. coumadin.  Marland Kitchen Helicobacter pylori gastritis 08/14/2011  . Dysphagia 08/14/2011  FEB 2013 EGD/DIL 16 MM; GERD; h/o gastroesophageal reflux disease; + H. Pylori gastritis   . Dizziness and giddiness 05/19/2012  . Automatic implantable cardioverter-defibrillator in situ     a. 03/2013: BSX Energen CRTD BiV ICC, ser# [id number]  . Chronic kidney disease     creatinine- 1.8 in 09/2006 and 2.0 in 05/2007; 2.05 in 2011, 2.29 in 2012  . Kidney stones 1990's  . Chronic systolic CHF (congestive heart failure)     a. 03/2013 Echo: Sev LV dysfxn with sev diff HK, restrictive phys, diast dysfxn, mild MR, sev dil LA/RA, mod PR, PASP 66mmHg.  Marland Kitchen IDDM (insulin dependent diabetes mellitus)   . GERD (gastroesophageal reflux disease)   . Stroke 1999    denies residual on 04/21/2013    ROS:   All systems reviewed and negative except as noted in the HPI.   Past Surgical History  Procedure Laterality Date  . Colonoscopy  04/23/2011    [id number] COLON POLYP/ INTERNAL HEMORRHOIDS/ TORTUOUS COLON  . Upper gastrointestinal endoscopy   FEB 2013    RING/DIL TO 16 MM, H PYLORI GASTRITIS  . Bi-ventricular implantable cardioverter defibrillator  (crt-d)  04/21/2013  . Pacemaker generator change  01/2004  . Insert / replace / remove pacemaker  09/1997    Guidant VVI [employer]     Family History  Problem Relation Age of Onset  . Adopted: Yes  . Colon cancer Neg Hx      History   Social History  . Marital Status: Married    Spouse Name: N/A    Number of Children: 4  . Years of Education: N/A   Occupational History  .     Social History Main Topics  . Smoking status: Never Smoker   . Smokeless tobacco: Never Used  . Alcohol Use: No  . Drug Use: No  . Sexual Activity: Yes   Other Topics Concern  . Not on file   Social History Narrative  . No narrative on file     BP 119/71  Pulse 63  Ht 5\' 6"  (1.676 m)  Wt 186 lb 6.4 oz (84.55 kg)  BMI 30.10 kg/m2  SpO2 100%  Physical Exam:  Well appearing NAD HEENT: Unremarkable Neck:  No JVD, no thyromegally Lymphatics:  No adenopathy Back:  No CVA tenderness Lungs:  Clear with no wheezes, rales, or rhonchi. Small residual hematoma HEART:  Regular rate rhythm, no murmurs, no rubs, no clicks Abd:  soft, positive bowel sounds, no organomegally, no rebound, no guarding Ext:  2 plus pulses, no edema, no cyanosis, no clubbing Skin:  No rashes no nodules Neuro:  CN II through XII intact, motor grossly intact   DEVICE  Normal device function.  See PaceArt for details.   Assess/Plan:

## 2013-06-06 NOTE — Assessment & Plan Note (Signed)
Her symptoms appear to be much improved. She will continue her current meds and I have asked her to maintain a low sodium diet.

## 2013-06-06 NOTE — Assessment & Plan Note (Signed)
Her [employer] Sci[employer] ICD appears to be working normally. Will recheck in several months

## 2013-06-06 NOTE — Assessment & Plan Note (Signed)
Her ventricular rate is well controlled. She has restarted her chronic anti-coagulation.

## 2013-06-11 ENCOUNTER — Other Ambulatory Visit: Payer: Self-pay | Admitting: *Deleted

## 2013-06-11 MED ORDER — INSULIN ASPART 100 UNIT/ML FLEXPEN: 12.0000 [IU] | PEN_INJECTOR | Freq: Three times a day (TID) | SUBCUTANEOUS | Status: DC

## 2013-06-17 ENCOUNTER — Ambulatory Visit (INDEPENDENT_AMBULATORY_CARE_PROVIDER_SITE_OTHER): Payer: BC Managed Care – PPO | Admitting: *Deleted

## 2013-06-17 ENCOUNTER — Ambulatory Visit (INDEPENDENT_AMBULATORY_CARE_PROVIDER_SITE_OTHER): Payer: Medicaid Other | Admitting: Gastroenterology

## 2013-06-17 ENCOUNTER — Encounter: Payer: Medicaid Other | Admitting: Gastroenterology

## 2013-06-17 ENCOUNTER — Encounter: Payer: Self-pay | Admitting: Gastroenterology

## 2013-06-17 VITALS — BP 110/70 | HR 72 | Temp 98.1°F | Ht 68.0 in | Wt 186.0 lb

## 2013-06-17 DIAGNOSIS — K3189 Other diseases of stomach and duodenum: Secondary | ICD-10-CM

## 2013-06-17 DIAGNOSIS — R1013 Epigastric pain: Secondary | ICD-10-CM

## 2013-06-17 DIAGNOSIS — Z5181 Encounter for therapeutic drug level monitoring: Secondary | ICD-10-CM

## 2013-06-17 DIAGNOSIS — I4891 Unspecified atrial fibrillation: Secondary | ICD-10-CM

## 2013-06-17 LAB — POCT INR: INR: 2

## 2013-06-17 NOTE — Addendum Note (Signed)
Addended by: Danie Binder on: 06/17/2013 09:54 AM   Modules accepted: Level of Service

## 2013-06-17 NOTE — Assessment & Plan Note (Signed)
SX CONTROLLED WITH DIET MODIFICATION & PROTONIX.  CONTINUE TO MONITOR SYMPTOMS. CONTINUE PROTONIX.  FOLLOW UP IN 6 MOS-1 YR.

## 2013-06-17 NOTE — Progress Notes (Addendum)
   Subjective:    Patient ID: [name], female    DOB: 24-Jan-1961, 53 y.o.   MRN: [id number]  [name], MD  HPI    Review of Systems     Objective:   Physical Exam        Assessment & Plan:

## 2013-06-17 NOTE — Progress Notes (Signed)
Subjective:    Patient ID: [name], female    DOB: 05/09/1960, 53 y.o.   MRN: [id number]  [name], MD  HPI JUST HAD PACER.DEFIB CHANGE. AS LONG AS SHE AVOIDS CITRUS FOODS SHE DOES FINE. BMs: DAILY. PT DENIES FEVER, CHILLS, BRBPR, nausea, vomiting, melena, diarrhea, constipation, abd pain, problems swallowing, OR heartburn or indigestion.  Past Medical History  Diagnosis Date  . Congenital third degree heart block     a. Guidant VVI pacemaker implanted in 09/1997; b. gen change in 01/2004;  c. 03/2013 upgrade to [place], ser# [id number].  Marland Kitchen Chronic atrial fibrillation     embolic rind  . Nonischemic cardiomyopathy     a. 03/2013 Echo: Sev LV dysfxn with sev diff HK, restrictive phys, diast dysfxn, mild MR, sev dil LA/RA, mod PR, PASP 67mmHg;  b. 03/2013: BSX Energen CRTD BiV ICC, ser# [id number].  Marland Kitchen Chronic anticoagulation     a. coumadin.  Marland Kitchen Helicobacter pylori gastritis 08/14/2011  . Dysphagia 08/14/2011    FEB 2013 EGD/DIL 16 MM; GERD; h/o gastroesophageal reflux disease; + H. Pylori gastritis   . Dizziness and giddiness 05/19/2012  . Automatic implantable cardioverter-defibrillator in situ     a. 03/2013: BSX Energen CRTD BiV ICC, ser# [id number]  . Chronic kidney disease     creatinine- 1.8 in 09/2006 and 2.0 in 05/2007; 2.05 in 2011, 2.29 in 2012  . Kidney stones 1990's  . Chronic systolic CHF (congestive heart failure)     a. 03/2013 Echo: Sev LV dysfxn with sev diff HK, restrictive phys, diast dysfxn, mild MR, sev dil LA/RA, mod PR, PASP 59mmHg.  Marland Kitchen IDDM (insulin dependent diabetes mellitus)   . GERD (gastroesophageal reflux disease)   . Stroke 1999    denies residual on 04/21/2013   Past Surgical History  Procedure Laterality Date  . Colonoscopy  04/23/2011    [id number] COLON POLYP/ INTERNAL HEMORRHOIDS/ TORTUOUS COLON  . Upper gastrointestinal endoscopy  FEB 2013    RING/DIL TO 16 MM, H PYLORI GASTRITIS  . Bi-ventricular implantable cardioverter  defibrillator  (crt-d)  04/21/2013  . Pacemaker generator change  01/2004  . Insert / replace / remove pacemaker  09/1997    Guidant VVI [employer]    Allergies  Allergen Reactions  . Wheat Swelling  . Latex Rash  . Penicillins Rash  . Sulfa Antibiotics Rash    Current Outpatient Prescriptions  Medication Sig Dispense Refill  . acetaminophen (TYLENOL) 500 MG tablet Take 500 mg by mouth every 6 (six) hours as needed for moderate pain.      Marland Kitchen allopurinol (ZYLOPRIM) 100 MG tablet Take 150 mg by mouth every morning.       . benazepril (LOTENSIN) 20 MG tablet Take 10 mg by mouth every morning. Takes 1/2 tablet      . carvedilol (COREG CR) 40 MG 24 hr capsule Take 40 mg by mouth daily.      Marland Kitchen COLCRYS 0.6 MG tablet Take 0.6 mg by mouth daily as needed (gout flare).       Marland Kitchen digoxin (LANOXIN) 0.125 MG tablet Take 1 tablet (0.125 mg total) by mouth daily. Every day except Saturday and sunday    . furosemide (LASIX) 80 MG tablet TAKE (1) TABLET BY MOUTH TWICE DAILY.    Marland Kitchen insulin aspart (NOVOLOG FLEXPEN) 100 UNIT/ML FlexPen Inject 12-16 Units into the skin 3 (three) times daily with meals. 12 units in the morning,14 units at lunch and 16 units at supper    .  Insulin Glargine (LANTUS SOLOSTAR) 100 UNIT/ML Solostar Pen Inject 30-34 Units into the skin 2 (two) times daily. 34 units in the morning and 30 units at bedtime      . Insulin Isophane Human (HUMULIN N KWIKPEN) 100 UNIT/ML Kiwkpen Inject 30 Units into the skin at bedtime.       . Liraglutide (VICTOZA) 18 MG/3ML SOPN Inject 0.6 mg into the skin daily with supper.       . pantoprazole (PROTONIX) 40 MG tablet Take 40 mg by mouth daily. 30 mins prior to first meal      . spironolactone (ALDACTONE) 25 MG tablet Take 25 mg by mouth every morning.       . warfarin (COUMADIN) 5 MG tablet Take 2.5-5 mg by mouth daily. Take 1/2 tablet daily          Review of Systems     Objective:   Physical Exam  Vitals reviewed. Constitutional: She is  oriented to person, place, and time. She appears well-nourished. No distress.  HENT:  Head: Normocephalic and atraumatic.  Mouth/Throat: Oropharynx is clear and moist. No oropharyngeal exudate.  Eyes: Pupils are equal, round, and reactive to light. No scleral icterus.  Neck: Normal range of motion. Neck supple.  Cardiovascular: Normal rate and regular rhythm.   Pulmonary/Chest: Effort normal and breath sounds normal.  Abdominal: Soft. Bowel sounds are normal. She exhibits no distension. There is no tenderness.  Musculoskeletal: She exhibits no edema.  Lymphadenopathy:    She has no cervical adenopathy.  Neurological: She is alert and oriented to person, place, and time.  NO FOCAL DEFICITS   Psychiatric: She has a normal mood and affect.          Assessment & Plan:

## 2013-06-17 NOTE — Patient Instructions (Signed)
CONTINUE PROTONIX.  WATCH WHAT YOU EAT.  FOLLOW UP IN 6 MOS TO 1 YEAR.

## 2013-06-17 NOTE — Progress Notes (Signed)
cc'd to pcp 

## 2013-06-18 ENCOUNTER — Other Ambulatory Visit: Payer: Self-pay | Admitting: Endocrinology

## 2013-06-18 NOTE — Progress Notes (Signed)
Reminder in epic °

## 2013-07-01 ENCOUNTER — Other Ambulatory Visit (HOSPITAL_COMMUNITY): Payer: Self-pay | Admitting: Internal Medicine

## 2013-07-01 ENCOUNTER — Ambulatory Visit (HOSPITAL_COMMUNITY)
Admission: RE | Admit: 2013-07-01 | Discharge: 2013-07-01 | Disposition: A | Discharge status: Home / Self Care | Payer: Medicaid Other | Source: Ambulatory Visit | Attending: Internal Medicine | Admitting: Internal Medicine

## 2013-07-01 DIAGNOSIS — M25519 Pain in unspecified shoulder: Secondary | ICD-10-CM

## 2013-07-01 DIAGNOSIS — Z45018 Encounter for adjustment and management of other part of cardiac pacemaker: Secondary | ICD-10-CM | POA: Insufficient documentation

## 2013-07-05 ENCOUNTER — Other Ambulatory Visit: Payer: Self-pay | Admitting: *Deleted

## 2013-07-05 MED ORDER — INSULIN ISOPHANE HUMAN 100 UNIT/ML KWIKPEN: PEN_INJECTOR | SUBCUTANEOUS | Status: DC

## 2013-07-15 ENCOUNTER — Ambulatory Visit (INDEPENDENT_AMBULATORY_CARE_PROVIDER_SITE_OTHER): Payer: BC Managed Care – PPO | Admitting: *Deleted

## 2013-07-15 ENCOUNTER — Telehealth: Payer: Self-pay | Admitting: Internal Medicine

## 2013-07-15 DIAGNOSIS — I4891 Unspecified atrial fibrillation: Secondary | ICD-10-CM

## 2013-07-15 DIAGNOSIS — Z5181 Encounter for therapeutic drug level monitoring: Secondary | ICD-10-CM

## 2013-07-15 LAB — POCT INR: INR: 2.5

## 2013-07-15 NOTE — Telephone Encounter (Signed)
Patient would like to speak with [name] regarding whether her device is working right or not. / tgs

## 2013-07-15 NOTE — Telephone Encounter (Signed)
Latitude transmitter working properly, patient aware.

## 2013-07-26 ENCOUNTER — Ambulatory Visit (INDEPENDENT_AMBULATORY_CARE_PROVIDER_SITE_OTHER): Payer: BC Managed Care – PPO | Admitting: Internal Medicine

## 2013-07-26 ENCOUNTER — Encounter: Payer: Self-pay | Admitting: Internal Medicine

## 2013-07-26 ENCOUNTER — Encounter: Payer: Medicaid Other | Admitting: Internal Medicine

## 2013-07-26 VITALS — BP 118/82 | HR 60 | Ht 66.0 in | Wt 184.0 lb

## 2013-07-26 DIAGNOSIS — I5022 Chronic systolic (congestive) heart failure: Secondary | ICD-10-CM

## 2013-07-26 DIAGNOSIS — Q246 Congenital heart block: Secondary | ICD-10-CM

## 2013-07-26 DIAGNOSIS — Z95810 Presence of automatic (implantable) cardiac defibrillator: Secondary | ICD-10-CM

## 2013-07-26 DIAGNOSIS — I4891 Unspecified atrial fibrillation: Secondary | ICD-10-CM

## 2013-07-26 LAB — MDC_IDC_ENUM_SESS_TYPE_INCLINIC
HIGH POWER IMPEDANCE MEASURED VALUE: 73 Ohm
Lead Channel Impedance Value: 568 Ohm
Lead Channel Impedance Value: 693 Ohm
Lead Channel Pacing Threshold Amplitude: 0.8 V
Lead Channel Pacing Threshold Pulse Width: 0.5 ms
Lead Channel Setting Pacing Amplitude: 2.4 V
Lead Channel Setting Pacing Pulse Width: 0.5 ms
Lead Channel Setting Pacing Pulse Width: 0.5 ms
Lead Channel Setting Sensing Sensitivity: 1 mV
MDC IDC MSMT LEADCHNL RV PACING THRESHOLD AMPLITUDE: 0.5 V
MDC IDC MSMT LEADCHNL RV PACING THRESHOLD PULSEWIDTH: 0.5 ms
MDC IDC PG SERIAL: 114945
MDC IDC SESS DTM: 20150601040000
MDC IDC SET LEADCHNL LV PACING AMPLITUDE: 2.4 V
MDC IDC SET LEADCHNL RV SENSING SENSITIVITY: 0.6 mV
MDC IDC SET ZONE DETECTION INTERVAL: 300 ms
MDC IDC SET ZONE DETECTION INTERVAL: 353 ms
MDC IDC STAT BRADY RV PERCENT PACED: 100 %

## 2013-07-26 NOTE — Progress Notes (Signed)
HPI [name] returns today for followup. She is a pleasant 53 yo woman with a h/o chronic atrial fib and chb, s/p PPM who underwent upgrade to a BiV ICD several months ago. She had class 2b CHF, with pacing induced LBBB and a QRS duration of 170 ms. She had her procedure complicated by a large hematoma and we held her coumadin then started it back at a lower dose. He hematoma has resolved. She denies any fevers, chills, or other infectious symptoms. She feels well. Her husband states (as does the patient) that her energy level is better. No other complaints today. She is exercising regularly without limitation. Allergies  Allergen Reactions  . Wheat Swelling  . Latex Rash  . Penicillins Rash  . Sulfa Antibiotics Rash     Current Outpatient Prescriptions  Medication Sig Dispense Refill  . acetaminophen (TYLENOL) 500 MG tablet Take 500 mg by mouth every 6 (six) hours as needed for moderate pain.      Marland Kitchen allopurinol (ZYLOPRIM) 100 MG tablet Take 150 mg by mouth every morning.       . benazepril (LOTENSIN) 20 MG tablet Take 10 mg by mouth every morning. Takes 1/2 tablet      . carvedilol (COREG CR) 40 MG 24 hr capsule Take 40 mg by mouth daily.      Marland Kitchen COLCRYS 0.6 MG tablet Take 0.6 mg by mouth daily as needed (gout flare).       Marland Kitchen digoxin (LANOXIN) 0.125 MG tablet Take 1 tablet (0.125 mg total) by mouth daily. Every day except Saturday and sunday  30 tablet  6  . furosemide (LASIX) 80 MG tablet TAKE (1) TABLET BY MOUTH TWICE DAILY.  60 tablet  3  . insulin aspart (NOVOLOG FLEXPEN) 100 UNIT/ML FlexPen Inject 12-16 Units into the skin 3 (three) times daily with meals. 12 units in the morning,14 units at lunch and 16 units at supper  15 mL  3  . Insulin NPH, Human,, Isophane, (HUMULIN N KWIKPEN) 100 UNIT/ML Kiwkpen Inject 34 units daily at bedtime  15 mL  3  . LANTUS SOLOSTAR 100 UNIT/ML Solostar Pen INJECT 40 UNITS SUBCUTANEOUSLY IN THE MORNING AND 30 UNITS AT BEDTIME.  15 mL  3  .  Liraglutide (VICTOZA) 18 MG/3ML SOPN Inject 0.6 mg into the skin daily with supper.       . pantoprazole (PROTONIX) 40 MG tablet Take 40 mg by mouth daily. 30 mins prior to first meal      . spironolactone (ALDACTONE) 25 MG tablet Take 25 mg by mouth every morning.       . warfarin (COUMADIN) 5 MG tablet Take 2.5-5 mg by mouth daily. Take 1/2 tablet daily       No current facility-administered medications for this visit.     Past Medical History  Diagnosis Date  . Congenital third degree heart block     a. Guidant VVI pacemaker implanted in 09/1997; b. gen change in 01/2004;  c. 03/2013 upgrade to [place], ser# [id number].  Marland Kitchen Chronic atrial fibrillation     embolic rind  . Nonischemic cardiomyopathy     a. 03/2013 Echo: Sev LV dysfxn with sev diff HK, restrictive phys, diast dysfxn, mild MR, sev dil LA/RA, mod PR, PASP 30mmHg;  b. 03/2013: BSX Energen CRTD BiV ICC, ser# [id number].  Marland Kitchen Chronic anticoagulation     a. coumadin.  Marland Kitchen Helicobacter pylori gastritis 08/14/2011  . Dysphagia 08/14/2011  FEB 2013 EGD/DIL 16 MM; GERD; h/o gastroesophageal reflux disease; + H. Pylori gastritis   . Dizziness and giddiness 05/19/2012  . Automatic implantable cardioverter-defibrillator in situ     a. 03/2013: BSX Energen CRTD BiV ICC, ser# [id number]  . Chronic kidney disease     creatinine- 1.8 in 09/2006 and 2.0 in 05/2007; 2.05 in 2011, 2.29 in 2012  . Kidney stones 1990's  . Chronic systolic CHF (congestive heart failure)     a. 03/2013 Echo: Sev LV dysfxn with sev diff HK, restrictive phys, diast dysfxn, mild MR, sev dil LA/RA, mod PR, PASP 37mmHg.  Marland Kitchen IDDM (insulin dependent diabetes mellitus)   . GERD (gastroesophageal reflux disease)   . Stroke 1999    denies residual on 04/21/2013    ROS:   All systems reviewed and negative except as noted in the HPI.   Past Surgical History  Procedure Laterality Date  . Colonoscopy  04/23/2011    [id number] COLON POLYP/ INTERNAL HEMORRHOIDS/  TORTUOUS COLON  . Upper gastrointestinal endoscopy  FEB 2013    RING/DIL TO 16 MM, H PYLORI GASTRITIS  . Bi-ventricular implantable cardioverter defibrillator  (crt-d)  04/21/2013  . Pacemaker generator change  01/2004  . Insert / replace / remove pacemaker  09/1997    Guidant VVI [employer]     Family History  Problem Relation Age of Onset  . Adopted: Yes  . Colon cancer Neg Hx      History   Social History  . Marital Status: Married    Spouse Name: N/A    Number of Children: 4  . Years of Education: N/A   Occupational History  .     Social History Main Topics  . Smoking status: Never Smoker   . Smokeless tobacco: Never Used  . Alcohol Use: No  . Drug Use: No  . Sexual Activity: Yes   Other Topics Concern  . Not on file   Social History Narrative  . No narrative on file     BP 118/82  Pulse 60  Ht 5\' 6"  (1.676 m)  Wt 184 lb (83.462 kg)  BMI 29.71 kg/m2  SpO2 99%  Physical Exam:  Well appearing middle aged woman, NAD HEENT: Unremarkable Neck:  No JVD, no thyromegally Back:  No CVA tenderness Lungs:  Clear with no wheezes, rales, or rhonchi. No residual hematoma HEART:  Regular rate rhythm, no murmurs, no rubs, no clicks Abd:  soft, positive bowel sounds, no organomegally, no rebound, no guarding Ext:  2 plus pulses, no edema, no cyanosis, no clubbing Skin:  No rashes no nodules Neuro:  CN II through XII intact, motor grossly intact   DEVICE  Normal device function.  See PaceArt for details.   Assess/Plan:

## 2013-07-26 NOTE — Assessment & Plan Note (Signed)
She is back on her anti-coagulation and her ventricular rate is well controlled.

## 2013-07-26 NOTE — Assessment & Plan Note (Signed)
Her symptoms are now back to class 2A from 3A. She will continue her current meds and maintain a low sodium diet.

## 2013-07-26 NOTE — Assessment & Plan Note (Signed)
Her [employer] BiV ICD is working normally. Will recheck in several months. 

## 2013-07-26 NOTE — Patient Instructions (Addendum)
Your physician recommends that you schedule a follow-up appointment in: 6 months  with Dr Knox Saliva will receive a reminder letter two months in advance reminding you to call and schedule your appointment. If you don't receive this letter, please contact our office.  Remote monitoring is used to monitor your Pacemaker of ICD from home. This monitoring reduces the number of office visits required to check your device to one time per year. It allows [place] to keep an eye on the functioning of your device to ensure it is working properly. You are scheduled for a device check from home on 10-27-2013. You may send your transmission at any time that day. If you have a wireless device, the transmission will be sent automatically. After your physician reviews your transmission, you will receive a postcard with your next transmission date.

## 2013-08-02 ENCOUNTER — Ambulatory Visit (INDEPENDENT_AMBULATORY_CARE_PROVIDER_SITE_OTHER): Payer: BC Managed Care – PPO | Admitting: Endocrinology

## 2013-08-02 ENCOUNTER — Encounter: Payer: Self-pay | Admitting: Endocrinology

## 2013-08-02 VITALS — BP 118/62 | HR 69 | Temp 98.1°F | Resp 14 | Ht 65.0 in | Wt 184.2 lb

## 2013-08-02 DIAGNOSIS — N184 Chronic kidney disease, stage 4 (severe): Secondary | ICD-10-CM

## 2013-08-02 DIAGNOSIS — E785 Hyperlipidemia, unspecified: Secondary | ICD-10-CM

## 2013-08-02 DIAGNOSIS — E1165 Type 2 diabetes mellitus with hyperglycemia: Principal | ICD-10-CM

## 2013-08-02 DIAGNOSIS — E1129 Type 2 diabetes mellitus with other diabetic kidney complication: Secondary | ICD-10-CM

## 2013-08-02 LAB — HEMOGLOBIN A1C: Hgb A1c MFr Bld: 8.5 % — ABNORMAL HIGH (ref 4.6–6.5)

## 2013-08-02 LAB — COMPREHENSIVE METABOLIC PANEL
ALBUMIN: 3.8 g/dL (ref 3.5–5.2)
ALK PHOS: 160 U/L — AB (ref 39–117)
ALT: 35 U/L (ref 0–35)
AST: 34 U/L (ref 0–37)
BUN: 67 mg/dL — AB (ref 6–23)
CALCIUM: 8.9 mg/dL (ref 8.4–10.5)
CO2: 28 mEq/L (ref 19–32)
CREATININE: 2.3 mg/dL — AB (ref 0.4–1.2)
Chloride: 102 mEq/L (ref 96–112)
GFR: 28.4 mL/min — ABNORMAL LOW (ref >60.00)
Glucose, Bld: 84 mg/dL (ref 70–99)
POTASSIUM: 4.3 meq/L (ref 3.5–5.1)
Sodium: 138 mEq/L (ref 135–145)
Total Bilirubin: 0.7 mg/dL (ref 0.2–1.2)
Total Protein: 7.2 g/dL (ref 6.0–8.3)

## 2013-08-02 MED ORDER — LIRAGLUTIDE 18 MG/3ML ~~LOC~~ SOPN: 1.2000 mg | PEN_INJECTOR | Freq: Every day | SUBCUTANEOUS | Status: DC

## 2013-08-02 NOTE — Patient Instructions (Addendum)
Victoza 1.2 mg daily  Please check blood sugars at least half the time about 2 hours after any meal and every 2 days on waking up. Sugar after meals < 180 target Please bring blood sugar monitor to each visit  Low fat snacks,  If eating less at supper take 14 Novolog  Call if am sugar stays > 150 call

## 2013-08-02 NOTE — Progress Notes (Signed)
Patient ID: [name], female   DOB: Mar 10, 1960, 53 y.o.   MRN: [id number]   Reason for Appointment : Follow up for Type 1 Diabetes  History of Present Illness           Diagnosis: Type 2 diabetes mellitus, date of diagnosis: 1990        Past history: She has been on insulin since 2007. Generally has had poor control in requiring large doses of insulin. Blood sugars also have been previously variable making her control difficult. She has had difficulty following instructions for mealtime insulin consistently in the past  Has been on a multiple injection regimen of Lantus twice a day, NovoLog a.c. and NPH at bedtime A1c has been as high as 10.7 in 2014  INSULIN regimen is described as: Lantus a.m. 34 - 30 in PM. Humulin N 34 hs, NovoLog a.c.12- 14-16  Recent history: She has been on Victoza since 11/2012 because of persistently poor control and using large doses of insulin. Subsequently he had been taking  lower dose of LANTUS insulin  and somewhat less NPH  However her A1c did not improve beyond 8.5% Although she thinks the Victoza as helped her blood sugar control even with 0.6 mg blood sugars since 2015 glucose levels have been fluctuating more with several readings over 200  Because of this her Victoza was increased to 5 clicks beyond 0.6 mg; she is tolerating this without any diarrhea but not clear if her sugars are any better  Glucose patterns:  Fasting readings are mostly high although she has periodic readings in the upper normal range including about 2 days ago  She thinks some of the high readings in the mornings from eating snacks like chips at night  She does not check blood sugars much after meals and recently mostly in the mornings only  Blood sugars are overall somewhat higher around 8 PM but not clear which readings are after meals and has only a few readings to review  Overall blood sugar average is slightly higher than her last visit from her meter but her A1c is  pending   Glucose monitoring:  done 1.7 times a day         Glucometer:  Accu-Chek [employer]       Blood Glucose readings from meter download:   PREMEAL Breakfast Lunch  7-9 PM  Bedtime Overall  Glucose range:  95-246   135, 243   112-305   64, 65   64-305   Mean/median: 165    162   POST-MEAL PC Breakfast  2-4 PM  PC Dinner  Glucose range: ?   96-226    Mean/median:      Hypoglycemia:  occasionally 64-65 glucose levels late at night  Self-care: The diet that the patient has been following is: Small portions Usually   Meals: 3 meals per day. Bfst 9am; lunch 12 pm Supper at about 6-7 PM, mealtimes are variable          Physical activity: exercise: Walking on treadmill 10-15 min 3/7 days         Dietician visit: Most recent:.Several years ago           Wt Readings from Last 3 Encounters:  08/02/13 184 lb 3.2 oz (83.553 kg)  07/26/13 184 lb (83.462 kg)  06/17/13 186 lb (84.369 kg)   Previous A1c results, 9.9, 11.3  Lab Results  Component Value Date   HGBA1C 8.5* 05/31/2013   HGBA1C 8.5* 03/18/2013  HGBA1C 10.7* 12/01/2012   Lab Results  Component Value Date   MICROALBUR 2.4* 03/18/2013   LDLCALC 30 04/18/2009   CREATININE 2.04* 05/31/2013     Lab Results  Component Value Date   MICROALBUR 2.4* 03/18/2013      Medication List       This list is accurate as of: 08/02/13  1:09 PM.  Always use your most recent med list.               acetaminophen 500 MG tablet  Commonly known as:  TYLENOL  Take 500 mg by mouth every 6 (six) hours as needed for moderate pain.     allopurinol 100 MG tablet  Commonly known as:  ZYLOPRIM  Take 150 mg by mouth every morning.     benazepril 20 MG tablet  Commonly known as:  LOTENSIN  Take 10 mg by mouth every morning. Takes 1/2 tablet     carvedilol 40 MG 24 hr capsule  Commonly known as:  COREG CR  Take 40 mg by mouth daily.     COLCRYS 0.6 MG tablet  Generic drug:  colchicine  Take 0.6 mg by mouth daily as needed (gout flare).      digoxin 0.125 MG tablet  Commonly known as:  LANOXIN  Take 1 tablet (0.125 mg total) by mouth daily. Every day except Saturday and sunday     furosemide 80 MG tablet  Commonly known as:  LASIX  TAKE (1) TABLET BY MOUTH TWICE DAILY.     insulin aspart 100 UNIT/ML FlexPen  Commonly known as:  NOVOLOG FLEXPEN  Inject 12-16 Units into the skin 3 (three) times daily with meals. 12 units in the morning,14 units at lunch and 16 units at supper     Insulin NPH (Human) (Isophane) 100 UNIT/ML Kiwkpen  Commonly known as:  HUMULIN N KWIKPEN  Inject 34 units daily at bedtime     LANTUS SOLOSTAR 100 UNIT/ML Solostar Pen  Generic drug:  Insulin Glargine  INJECT 40 UNITS SUBCUTANEOUSLY IN THE MORNING AND 30 UNITS AT BEDTIME.     Liraglutide 18 MG/3ML Sopn  Commonly known as:  VICTOZA  Inject 1.2 mg into the skin daily with supper.     pantoprazole 40 MG tablet  Commonly known as:  PROTONIX  Take 40 mg by mouth daily. 30 mins prior to first meal     spironolactone 25 MG tablet  Commonly known as:  ALDACTONE  Take 25 mg by mouth every morning.     warfarin 5 MG tablet  Commonly known as:  COUMADIN  Take 2.5-5 mg by mouth daily. Take 1/2 tablet daily        Allergies:  Allergies  Allergen Reactions  . Wheat Swelling  . Latex Rash  . Penicillins Rash  . Sulfa Antibiotics Rash    Past Medical History  Diagnosis Date  . Congenital third degree heart block     a. Guidant VVI pacemaker implanted in 09/1997; b. gen change in 01/2004;  c. 03/2013 upgrade to [place], ser# [id number].  Marland Kitchen Chronic atrial fibrillation     embolic rind  . Nonischemic cardiomyopathy     a. 03/2013 Echo: Sev LV dysfxn with sev diff HK, restrictive phys, diast dysfxn, mild MR, sev dil LA/RA, mod PR, PASP 12mmHg;  b. 03/2013: BSX Energen CRTD BiV ICC, ser# [id number].  Marland Kitchen Chronic anticoagulation     a. coumadin.  Marland Kitchen Helicobacter pylori gastritis 08/14/2011  . Dysphagia 08/14/2011  FEB 2013 EGD/DIL 16 MM;  GERD; h/o gastroesophageal reflux disease; + H. Pylori gastritis   . Dizziness and giddiness 05/19/2012  . Automatic implantable cardioverter-defibrillator in situ     a. 03/2013: BSX Energen CRTD BiV ICC, ser# [id number]  . Chronic kidney disease     creatinine- 1.8 in 09/2006 and 2.0 in 05/2007; 2.05 in 2011, 2.29 in 2012  . Kidney stones 1990's  . Chronic systolic CHF (congestive heart failure)     a. 03/2013 Echo: Sev LV dysfxn with sev diff HK, restrictive phys, diast dysfxn, mild MR, sev dil LA/RA, mod PR, PASP 81mmHg.  Marland Kitchen IDDM (insulin dependent diabetes mellitus)   . GERD (gastroesophageal reflux disease)   . Stroke 1999    denies residual on 04/21/2013    Past Surgical History  Procedure Laterality Date  . Colonoscopy  04/23/2011    [id number] COLON POLYP/ INTERNAL HEMORRHOIDS/ TORTUOUS COLON  . Upper gastrointestinal endoscopy  FEB 2013    RING/DIL TO 16 MM, H PYLORI GASTRITIS  . Bi-ventricular implantable cardioverter defibrillator  (crt-d)  04/21/2013  . Pacemaker generator change  01/2004  . Insert / replace / remove pacemaker  09/1997    Guidant VVI [employer]    Family History  Problem Relation Age of Onset  . Adopted: Yes  . Colon cancer Neg Hx     Social History:  reports that she has never smoked. She has never used smokeless tobacco. She reports that she does not drink alcohol or use illicit drugs.    Review of Systems:   She has had stable chronic renal insufficiency, followed by nephrologist. Creatinine appears slightly better  She has had cardiomyopathy and atrial fibrillation followed by cardiologist  Lipids: Has dyslipidemia with low HDL, high triglycerides  Lab Results  Component Value Date   CHOL 144 01/27/2013   HDL 23.00* 01/27/2013   LDLCALC 30 04/18/2009   LDLDIRECT 93.1 01/27/2013   TRIG 209.0* 01/27/2013   CHOLHDL 6 01/27/2013     Physical Examination:  BP 118/62  Pulse 69  Temp(Src) 98.1 F (36.7 C)  Resp 14  Ht 5\' 5"  (1.651 m)   Wt 184 lb 3.2 oz (83.553 kg)  BMI 30.65 kg/m2  SpO2 98%      She has   no pedal edema Foot exam normal except absent pedal pulses on the right  ASSESSMENT:  Diabetes type 1:   Her blood sugars are overall better quite variable and showing about the same pattern as on her previous visit See history of present illness for detailed discussion of her current blood sugar patterns, treatment regimen, meal planning and problems identified She reported some frustration with trying to keep up with her diet and day-to-day regimen with insulin and glucose monitoring Although she is exercising she is not able to lose much weight Has need for improvement of the following:  More postprandial readings  More consistent diet  Avoiding late night snacks especially high-fat foods  Knowledge of meal planning  Possibly may be needing better compliance with mealtime insulin  She needs to adjust her mealtime coverage for amount of food or carbohydrate especially at suppertime to avoid fluctuation in blood sugar  CKD: Stable function  PLAN:    Discussed needing to check blood sugars more consistently after meals including breakfast and lunch  More consistent mealtime coverage and adjust the dose based on carbohydrate and total calorie intake  No change in insulin  Consultation with dietitian  Given outline of foods to have  for snacks with 15 g of carbohydrate  Try a higher dose of Victoza using 1.2 mg Victoza  Check A1c again today  Counseling time over 50% of today's 25 minute visit  Elayne Snare 08/02/2013, 1:09 PM

## 2013-08-04 ENCOUNTER — Ambulatory Visit: Payer: Medicaid Other | Admitting: Cardiovascular Disease

## 2013-08-12 ENCOUNTER — Ambulatory Visit (INDEPENDENT_AMBULATORY_CARE_PROVIDER_SITE_OTHER): Payer: BC Managed Care – PPO | Admitting: *Deleted

## 2013-08-12 DIAGNOSIS — Z5181 Encounter for therapeutic drug level monitoring: Secondary | ICD-10-CM

## 2013-08-12 DIAGNOSIS — I4891 Unspecified atrial fibrillation: Secondary | ICD-10-CM

## 2013-08-12 LAB — POCT INR: INR: 2.5

## 2013-09-16 ENCOUNTER — Ambulatory Visit (INDEPENDENT_AMBULATORY_CARE_PROVIDER_SITE_OTHER): Payer: BC Managed Care – PPO | Admitting: *Deleted

## 2013-09-16 DIAGNOSIS — Z5181 Encounter for therapeutic drug level monitoring: Secondary | ICD-10-CM

## 2013-09-16 DIAGNOSIS — I4891 Unspecified atrial fibrillation: Secondary | ICD-10-CM

## 2013-09-16 LAB — POCT INR: INR: 2.1

## 2013-09-24 ENCOUNTER — Other Ambulatory Visit: Payer: Self-pay | Admitting: Gastroenterology

## 2013-10-04 ENCOUNTER — Other Ambulatory Visit: Payer: Self-pay | Admitting: Endocrinology

## 2013-10-05 ENCOUNTER — Encounter: Payer: Self-pay | Admitting: Cardiovascular Disease

## 2013-10-05 ENCOUNTER — Ambulatory Visit (INDEPENDENT_AMBULATORY_CARE_PROVIDER_SITE_OTHER): Payer: BC Managed Care – PPO | Admitting: Cardiovascular Disease

## 2013-10-05 VITALS — BP 104/70 | HR 70 | Ht 66.0 in | Wt 185.0 lb

## 2013-10-05 DIAGNOSIS — I8390 Asymptomatic varicose veins of unspecified lower extremity: Secondary | ICD-10-CM

## 2013-10-05 DIAGNOSIS — Z95810 Presence of automatic (implantable) cardiac defibrillator: Secondary | ICD-10-CM

## 2013-10-05 DIAGNOSIS — Z950 Presence of cardiac pacemaker: Secondary | ICD-10-CM

## 2013-10-05 DIAGNOSIS — I428 Other cardiomyopathies: Secondary | ICD-10-CM

## 2013-10-05 DIAGNOSIS — I83813 Varicose veins of bilateral lower extremities with pain: Secondary | ICD-10-CM

## 2013-10-05 DIAGNOSIS — Z7901 Long term (current) use of anticoagulants: Secondary | ICD-10-CM

## 2013-10-05 DIAGNOSIS — I83893 Varicose veins of bilateral lower extremities with other complications: Secondary | ICD-10-CM

## 2013-10-05 DIAGNOSIS — I4891 Unspecified atrial fibrillation: Secondary | ICD-10-CM

## 2013-10-05 DIAGNOSIS — I10 Essential (primary) hypertension: Secondary | ICD-10-CM

## 2013-10-05 DIAGNOSIS — I5022 Chronic systolic (congestive) heart failure: Secondary | ICD-10-CM

## 2013-10-05 NOTE — Progress Notes (Signed)
Patient ID: JOZELYN CAPEHART, female   DOB: Feb 08, 1961, 53 y.o.   MRN: [id number]      SUBJECTIVE: The patient is a 53 year old woman with a history of congenital third degree heart block, atrial fibrillation, nonischemic myopathy s/p BiV ICD (04/21/13) with chronic systolic heart failure, and insulin-dependent diabetes mellitus. Echocardiogram on 04/16/2013 demonstrated severely reduced left ventricular systolic function, EF [id number], biventricular dilatation, mild mitral and severe tricuspid regurgitation. She has been doing well from a cardiovascular standpoint and denies leg swelling. She's been trying to walk on the treadmill and get more activity at her own pace and feels very well. She said she feels very grateful. For a long time she has been bothered by varicose veins and will occasionally have pain related to these veins in the pocket and hamstring region after sitting for prolonged periods of time.  Review of Systems: As per "subjective", otherwise negative.  Allergies  Allergen Reactions  . Wheat Swelling  . Latex Rash  . Penicillins Rash  . Sulfa Antibiotics Rash    Current Outpatient Prescriptions  Medication Sig Dispense Refill  . acetaminophen (TYLENOL) 500 MG tablet Take 500 mg by mouth every 6 (six) hours as needed for moderate pain.      Marland Kitchen allopurinol (ZYLOPRIM) 100 MG tablet Take 150 mg by mouth every morning.       . benazepril (LOTENSIN) 20 MG tablet Take 10 mg by mouth every morning. Takes 1/2 tablet      . carvedilol (COREG CR) 40 MG 24 hr capsule Take 40 mg by mouth daily.      Marland Kitchen COLCRYS 0.6 MG tablet Take 0.6 mg by mouth daily as needed (gout flare).       Marland Kitchen digoxin (LANOXIN) 0.125 MG tablet Take 1 tablet (0.125 mg total) by mouth daily. Every day except Saturday and sunday  30 tablet  6  . furosemide (LASIX) 80 MG tablet TAKE (1) TABLET BY MOUTH TWICE DAILY.  60 tablet  3  . insulin aspart (NOVOLOG FLEXPEN) 100 UNIT/ML FlexPen Inject 12-16 Units into the skin 3 (three)  times daily with meals. 12 units in the morning,14 units at lunch and 16 units at supper  15 mL  3  . Insulin NPH, Human,, Isophane, (HUMULIN N KWIKPEN) 100 UNIT/ML Kiwkpen Inject 34 units daily at bedtime  15 mL  3  . LANTUS SOLOSTAR 100 UNIT/ML Solostar Pen INJECT 40 UNITS SUBCUTANEOUSLY IN THE MORNING AND 30 UNITS AT BEDTIME.  30 mL  3  . Liraglutide (VICTOZA) 18 MG/3ML SOPN Inject 1.2 mg into the skin daily with supper.  2 pen  3  . pantoprazole (PROTONIX) 40 MG tablet TAKE 1 TABLET BY MOUTH 30 MINS PRIOR TO YOUR FIRST MEAL.  30 tablet  5  . spironolactone (ALDACTONE) 25 MG tablet Take 25 mg by mouth every morning.       . warfarin (COUMADIN) 5 MG tablet Take 2.5-5 mg by mouth daily. Take 1/2 tablet daily       No current facility-administered medications for this visit.    Past Medical History  Diagnosis Date  . Congenital third degree heart block     a. Guidant VVI pacemaker implanted in 09/1997; b. gen change in 01/2004;  c. 03/2013 upgrade to [place], ser# [id number].  Marland Kitchen Chronic atrial fibrillation     embolic rind  . Nonischemic cardiomyopathy     a. 03/2013 Echo: Sev LV dysfxn with sev diff HK, restrictive phys, diast dysfxn, mild  MR, sev dil LA/RA, mod PR, PASP 1mmHg;  b. 03/2013: BSX Energen CRTD BiV ICC, ser# [id number].  Marland Kitchen Chronic anticoagulation     a. coumadin.  Marland Kitchen Helicobacter pylori gastritis 08/14/2011  . Dysphagia 08/14/2011    FEB 2013 EGD/DIL 16 MM; GERD; h/o gastroesophageal reflux disease; + H. Pylori gastritis   . Dizziness and giddiness 05/19/2012  . Automatic implantable cardioverter-defibrillator in situ     a. 03/2013: BSX Energen CRTD BiV ICC, ser# [id number]  . Chronic kidney disease     creatinine- 1.8 in 09/2006 and 2.0 in 05/2007; 2.05 in 2011, 2.29 in 2012  . Kidney stones 1990's  . Chronic systolic CHF (congestive heart failure)     a. 03/2013 Echo: Sev LV dysfxn with sev diff HK, restrictive phys, diast dysfxn, mild MR, sev dil LA/RA, mod PR, PASP 54mmHg.   Marland Kitchen IDDM (insulin dependent diabetes mellitus)   . GERD (gastroesophageal reflux disease)   . Stroke 1999    denies residual on 04/21/2013    Past Surgical History  Procedure Laterality Date  . Colonoscopy  04/23/2011    [id number] COLON POLYP/ INTERNAL HEMORRHOIDS/ TORTUOUS COLON  . Upper gastrointestinal endoscopy  FEB 2013    RING/DIL TO 16 MM, H PYLORI GASTRITIS  . Bi-ventricular implantable cardioverter defibrillator  (crt-d)  04/21/2013  . Pacemaker generator change  01/2004  . Insert / replace / remove pacemaker  09/1997    Guidant VVI [employer]    History   Social History  . Marital Status: Married    Spouse Name: N/A    Number of Children: 4  . Years of Education: N/A   Occupational History  .     Social History Main Topics  . Smoking status: Never Smoker   . Smokeless tobacco: Never Used  . Alcohol Use: No  . Drug Use: No  . Sexual Activity: Yes   Other Topics Concern  . Not on file   Social History Narrative  . No narrative on file    BP 104/70  Pulse 70  Wt 185 lbs  PHYSICAL EXAM General: NAD  Neck: No JVD, no thyromegaly or thyroid nodule.  Lungs: Clear to auscultation bilaterally with normal respiratory effort.  CV: Nondisplaced PMI. Heart regular S1/S2, no [id number], II/VI pansystolic murmur along left sternal border. No peripheral edema. No carotid bruit. Normal pedal pulses.  Abdomen: Soft, nontender, no hepatosplenomegaly, no distention.  Neurologic: Alert and oriented x 3.  Psych: Normal affect.  Extremities: No clubbing or cyanosis. Bilateral venous varicosities.  ECG: reviewed and available in electronic records.      ASSESSMENT AND PLAN: 1. Nonischemic cardiomyopathy/chronic systolic heart failure s/p BiV ICD: EF 10-15% on 04/16/13. Currently with NYHA class II symptoms. Continue current diuretic regimen with furosemide and spironolactone. Device interrogation on 07/26/2013 demonstrated no mode switches, no ventricular  arrhythmias, and 100% biventricular pacing.  2. Essential HTN: Well controlled on present therapy.   3. Atrial fibrillation: Heart rate is well controlled on carvedilol and digoxin. She maintains anticoagulation with warfarin and is without bleeding problems.   4. Congenital third-degree heart block with pacemaker: Stable and followed by Dr. Lovena Le.   5. Venous varicosities: Will make a referral to vascular surgery.  Dispo: f/u 6 months.   Kate Sable, M.D., F.A.C.C.

## 2013-10-05 NOTE — Patient Instructions (Signed)
Your physician recommends that you schedule a follow-up appointment in: 6 months with Dr Virgina Jock will receive a reminder letter two months in advance reminding you to call and schedule your appointment. If you don't receive this letter, please contact our office.  You have been referred to Vascular surgeon. They will be in touch with you within a couple of weeks.

## 2013-10-07 ENCOUNTER — Ambulatory Visit (HOSPITAL_COMMUNITY): Admission: RE | Admit: 2013-10-07 | Payer: Medicaid Other | Source: Ambulatory Visit

## 2013-10-15 ENCOUNTER — Other Ambulatory Visit: Payer: Self-pay | Admitting: *Deleted

## 2013-10-15 DIAGNOSIS — I83893 Varicose veins of bilateral lower extremities with other complications: Secondary | ICD-10-CM

## 2013-10-18 ENCOUNTER — Encounter: Payer: Self-pay | Admitting: *Deleted

## 2013-10-19 ENCOUNTER — Ambulatory Visit (HOSPITAL_COMMUNITY)
Admission: RE | Admit: 2013-10-19 | Discharge: 2013-10-19 | Disposition: A | Discharge status: Home / Self Care | Payer: BC Managed Care – PPO | Source: Ambulatory Visit | Attending: Orthopaedic Surgery | Admitting: Orthopaedic Surgery

## 2013-10-19 DIAGNOSIS — M25529 Pain in unspecified elbow: Secondary | ICD-10-CM | POA: Diagnosis present

## 2013-10-19 DIAGNOSIS — IMO0001 Reserved for inherently not codable concepts without codable children: Secondary | ICD-10-CM | POA: Insufficient documentation

## 2013-10-19 DIAGNOSIS — M25521 Pain in right elbow: Secondary | ICD-10-CM

## 2013-10-19 NOTE — Evaluation (Signed)
Occupational Therapy Evaluation  Patient Details  Name: [name] MRN: [id number] Date of Birth: 10/08/1960  Today's Date: 10/19/2013 Time: 1020-1100 OT Time Calculation (min): 40 min OT eval 1020-1030 10' MFR 1030-1045 15' Therex 1045-1100 15'  Visit#: 1 of 1  Re-eval:    Assessment Diagnosis: Right arm pain Next MD Visit: None Prior Therapy: None  Authorization: Medicaid  Authorization Time Period:    Authorization Visit#:   of     Past Medical History:  Past Medical History  Diagnosis Date  . Congenital third degree heart block     a. Guidant VVI pacemaker implanted in 09/1997; b. gen change in 01/2004;  c. 03/2013 upgrade to BSX Energen CRTD BiV ICC, ser# [id number].  . Chronic atrial fibrillation     embolic rind  . Nonischemic cardiomyopathy     a. 03/2013 Echo: Sev LV dysfxn with sev diff HK, restrictive phys, diast dysfxn, mild MR, sev dil LA/RA, mod PR, PASP 50mmHg;  b. 03/2013: BSX Energen CRTD BiV ICC, ser# [id number].  . Chronic anticoagulation     a. coumadin.  . Helicobacter pylori gastritis 08/14/2011  . Dysphagia 08/14/2011    FEB 2013 EGD/DIL 16 MM; GERD; h/o gastroesophageal reflux disease; + H. Pylori gastritis   . Dizziness and giddiness 05/19/2012  . Automatic implantable cardioverter-defibrillator in situ     a. 03/2013: BSX Energen CRTD BiV ICC, ser# [id number]  . Chronic kidney disease     creatinine- 1.8 in 09/2006 and 2.0 in 05/2007; 2.05 in 2011, 2.29 in 2012  . Kidney stones 1990's  . Chronic systolic CHF (congestive heart failure)     a. 03/2013 Echo: Sev LV dysfxn with sev diff HK, restrictive phys, diast dysfxn, mild MR, sev dil LA/RA, mod PR, PASP 50mmHg.  . IDDM (insulin dependent diabetes mellitus)   . GERD (gastroesophageal reflux disease)   . Stroke 1999    denies residual on 04/21/2013   Past Surgical History:  Past Surgical History  Procedure Laterality Date  . Colonoscopy  04/23/2011    SLF:DESCENDING COLON POLYP/ INTERNAL HEMORRHOIDS/ TORTUOUS  COLON  . Upper gastrointestinal endoscopy  FEB 2013    RING/DIL TO 16 MM, H PYLORI GASTRITIS  . Bi-ventricular implantable cardioverter defibrillator  (crt-d)  04/21/2013  . Pacemaker generator change  01/2004  . Insert / replace / remove pacemaker  09/1997    Guidant VVI [employer]    Subjective Symptoms/Limitations Symptoms: S: It almost feels like a cold sensation in my arm. Like deep in the arm. It goes down to my fingers. Almost numb but not. Pertinent History: patient began to notice right arm pain approx. Feb. 2015 after she had a pacemake placement on the left side. pt received a cortizone shot couple months ago which has helped decrease the pain. Patient experiences pain mainly with shoulder extension and shoulder abduction movements. Dr. Keeling has referred patient to occupational therapy  for evaluation and treatment.  Limitations: disrupting sleep Patient Stated Goals: To be pain free. Pain Assessment Currently in Pain?: Yes (8-10/10 at it's worse. ) Pain Score: 4  Pain Location: Shoulder Pain Orientation: Right Pain Type: Acute pain Pain Frequency: Intermittent Pain Relieving Factors: Pt does not take pain meds or use heat or ice.  Precautions/Restrictions  Precautions Precautions: None Restrictions Weight Bearing Restrictions: No  Balance Screening Balance Screen Has the patient fallen in the past 6 months: No Has the patient had a decrease in activity level because of a fear of falling? : No Is   the patient reluctant to leave their home because of a fear of falling? : No  Prior [place] expects to be discharged to:: Private residence Living Arrangements: Spouse/significant other;Children Additional Comments: pt has 4 children. Pt attends the gym regularly with son. Prior Function Level of Independence: Independent with basic ADLs;Independent with gait Driving: Yes Vocation: On disability Leisure: Hobbies-yes  (Comment) Comments: attends the [employer]  Assessment ADL/Vision/Perception ADL ADL Comments: Difficulty sleeping at times. Pt previously was experiencing pain lifting a gallon of milk prior to cortisone shot. Dominant Hand: Right Vision - History Baseline Vision: Wears glasses all the time  Cognition/Observation Cognition Overall Cognitive Status: Within Functional Limits for tasks assessed Arousal/Alertness: Awake/alert Orientation Level: Oriented X4   Additional Assessments RUE AROM (degrees) Overall AROM Right Upper Extremity: Within functional limits for tasks performed RUE Strength Right Shoulder Flexion: 5/5 Right Shoulder ABduction: 5/5 Right Shoulder Internal Rotation: 4/5 Right Shoulder External Rotation: 5/5 LUE Strength LUE Overall Strength Comments: WFL  Palpation Palpation: Mod fascial restrictions in right upper arm region.     Exercise/Treatments Standing Horizontal ABduction: Theraband;5 reps Theraband Level (Shoulder Horizontal ABduction): Level 2 (Red) External Rotation: Theraband;5 reps Theraband Level (Shoulder External Rotation): Level 2 (Red) Internal Rotation: Theraband;5 reps Theraband Level (Shoulder Internal Rotation): Level 2 (Red) ABduction: Theraband;5 reps Theraband Level (Shoulder ABduction): Level 2 (Red) Extension: Theraband;5 reps Theraband Level (Shoulder Extension): Level 2 (Red) Stretches Corner Stretch: 2 reps (5 seconds) Cross Chest Stretch: 1 rep (5 seconds) Internal Rotation Stretch: 1 rep (5 seconds with towel) Wall Stretch - Flexion: 1 rep (5 seconds)     Manual Therapy Manual Therapy: Myofascial release Myofascial Release: Myofascial release to right upper arm to decrease fascial restrictions and pain.  Occupational Therapy Assessment and Plan OT Assessment and Plan Clinical Impression Statement: A: Patient is a 53 y/o female s/p right shoulder pain causing increased pain and fascial restrictions resulting in difficulty  completing daily tasks and disrupting sleep. Pt will benefit from skilled therapeutic intervention in order to improve on the following deficits: Increased fascial restricitons;Pain Rehab Potential: Excellent OT Frequency: Min 1X/week OT Duration:  (1 week) OT Treatment/Interventions: Patient/family education;Therapeutic exercise;Manual therapy OT Plan: P: 1 time eval only. Patient was educated on pain management techniques (moist heat/ice). Patient was given HEP including shoulder stretching and theraband exercises.    Goals Short Term Goals Time to Complete Short Term Goals:  (1 week) Short Term Goal 1: Patient will be educated on HEP. Short Term Goal 1 Progress: Met  Problem List Patient Active Problem List   Diagnosis Date Noted  . Pain in joint, upper arm 10/19/2013  . Biventricular ICD (implantable cardioverter-defibrillator) in place 05/21/2013  . Other and unspecified hyperlipidemia 04/11/2013  . Chronic systolic heart failure 38/17/7116  . Encounter for therapeutic drug monitoring 03/29/2013  . Chronic kidney disease, stage IV (severe) 01/27/2013  . Dyspepsia 09/10/2012  . Gout 05/15/2012  . Laboratory test 06/16/2011  . Chronic anticoagulation   . Congenital third degree heart block   . Reversible ischemic neurological deficit   . Cardiac pacemaker in situ 04/24/2009  . 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 CARDIOMYOPATHY 01/20/2008  . ATRIAL FIBRILLATION, CHRONIC 01/20/2008  . Cardiorenal syndrome/chronic kidney disease 01/20/2008    End of Session Activity Tolerance: Patient tolerated treatment well General Behavior During Therapy: [facility] for tasks assessed/performed OT Plan of Care OT Home Exercise Plan: shoulder stretches and theraband exercises OT Patient Instructions: handout (  scanned) Consulted and Agree with Plan of Care: Patient   Laura Essenmacher, OTR/L,CBIS   10/19/2013, 12:11  PM  Physician Documentation Your signature is required to indicate approval of the treatment plan as stated above.  Please sign and either send electronically or make a copy of this report for your files and return this physician signed original.  Please mark one 1.__approve of plan  2. ___approve of plan with the following conditions.   ______________________________                                                          _____________________ Physician Signature                                                                                                             Date  

## 2013-10-20 ENCOUNTER — Other Ambulatory Visit: Payer: Self-pay | Admitting: Adult Health

## 2013-10-21 ENCOUNTER — Telehealth: Payer: Self-pay | Admitting: Adult Health

## 2013-10-21 MED ORDER — WARFARIN SODIUM 5 MG PO TABS: ORAL_TABLET | ORAL | Status: DC

## 2013-10-21 NOTE — Telephone Encounter (Signed)
Received fax refill request  Rx # [id number] Medication:  Coumadin 5 mg tablet Qty 18 Sig:  Take 1/2 tablet by mouth daily except take 1 tablet on Monday and Thursdays Physician:  Purcell Nails

## 2013-10-27 ENCOUNTER — Ambulatory Visit (INDEPENDENT_AMBULATORY_CARE_PROVIDER_SITE_OTHER): Payer: BC Managed Care – PPO | Admitting: *Deleted

## 2013-10-27 ENCOUNTER — Encounter: Payer: Self-pay | Admitting: Internal Medicine

## 2013-10-27 DIAGNOSIS — I428 Other cardiomyopathies: Secondary | ICD-10-CM

## 2013-10-27 NOTE — Progress Notes (Signed)
Remote ICD transmission.   

## 2013-10-28 ENCOUNTER — Ambulatory Visit (INDEPENDENT_AMBULATORY_CARE_PROVIDER_SITE_OTHER): Payer: BC Managed Care – PPO | Admitting: Pharmacist

## 2013-10-28 DIAGNOSIS — Z5181 Encounter for therapeutic drug level monitoring: Secondary | ICD-10-CM

## 2013-10-28 DIAGNOSIS — I4891 Unspecified atrial fibrillation: Secondary | ICD-10-CM

## 2013-10-28 LAB — POCT INR: INR: 3.1

## 2013-10-29 LAB — MDC_IDC_ENUM_SESS_TYPE_REMOTE
Brady Statistic RV Percent Paced: 100 %
HighPow Impedance: 87 Ohm
Implantable Pulse Generator Serial Number: 114945
Lead Channel Impedance Value: 510 Ohm
Lead Channel Setting Pacing Amplitude: 2.4 V
Lead Channel Setting Sensing Sensitivity: 0.6 mV
MDC IDC MSMT LEADCHNL LV IMPEDANCE VALUE: 589 Ohm
MDC IDC SET LEADCHNL LV PACING PULSEWIDTH: 0.5 ms
MDC IDC SET LEADCHNL LV SENSING SENSITIVITY: 1 mV
MDC IDC SET LEADCHNL RV PACING AMPLITUDE: 2.4 V
MDC IDC SET LEADCHNL RV PACING PULSEWIDTH: 0.5 ms
MDC IDC SET ZONE DETECTION INTERVAL: 353 ms
Zone Setting Detection Interval: 300 ms

## 2013-11-04 ENCOUNTER — Encounter: Payer: Self-pay | Admitting: Gastroenterology

## 2013-11-04 ENCOUNTER — Encounter: Payer: Self-pay | Admitting: Cardiology

## 2013-11-06 ENCOUNTER — Other Ambulatory Visit: Payer: Self-pay | Admitting: Endocrinology

## 2013-11-09 ENCOUNTER — Encounter: Payer: Self-pay | Admitting: Cardiology

## 2013-11-18 ENCOUNTER — Ambulatory Visit (INDEPENDENT_AMBULATORY_CARE_PROVIDER_SITE_OTHER): Payer: BC Managed Care – PPO | Admitting: Endocrinology

## 2013-11-18 ENCOUNTER — Encounter: Payer: Self-pay | Admitting: Vascular Surgery

## 2013-11-18 ENCOUNTER — Encounter: Payer: Self-pay | Admitting: Endocrinology

## 2013-11-18 VITALS — BP 120/62 | HR 71 | Temp 97.7°F | Ht 66.0 in | Wt 186.0 lb

## 2013-11-18 DIAGNOSIS — E1129 Type 2 diabetes mellitus with other diabetic kidney complication: Secondary | ICD-10-CM

## 2013-11-18 DIAGNOSIS — E785 Hyperlipidemia, unspecified: Secondary | ICD-10-CM

## 2013-11-18 DIAGNOSIS — E1165 Type 2 diabetes mellitus with hyperglycemia: Secondary | ICD-10-CM

## 2013-11-18 NOTE — Progress Notes (Signed)
Patient ID: [name], female   DOB: September 29, 1960, 53 y.o.   MRN: [id number]   Reason for Appointment : Follow up for Type 1 Diabetes  History of Present Illness           Diagnosis: Type 2 diabetes mellitus, date of diagnosis: 1990        Past history: She has been on insulin since 2007. Generally has had poor control in requiring large doses of insulin. Blood sugars also have been previously variable making her control difficult. She has had difficulty following instructions for mealtime insulin consistently in the past  Has been on a multiple injection regimen of Lantus twice a day, NovoLog a.c. and NPH at bedtime A1c has been as high as 10.7 in 2014  INSULIN regimen is described as: Lantus a.m. 40 - 30 in PM. Humulin N 30 hs, NovoLog a.c.12- 14-16  Recent history: Her blood sugars are again poorly controlled and she had difficulty complying with her instructions for day-to-day management She was told to increase Victoza 1.2 mg but is still taking 0.6 mg Previously had benefited from  using Victoza and had improved control She also is checking her blood sugars mostly in the mornings and only occasionally in the afternoons and evenings She has irregular mealtimes; also nausea said that sometimes she will take her NovoLog 30 minutes after her meal She does not know how to adjust her insulin based on carbohydrate intake on meal size She has been on Victoza since 11/2012 because of persistently poor control and using large doses of insulin. Insulin doses: Also she is taking smaller doses of NPH at bedtime.instructed She thinks she is taking her Lantus and NPH consistently A1c is consistently 8.5%  Glucose patterns:  Fasting readings are mostly high although she has periodic readings in the upper normal range and once was 67  Blood sugars are generally higher midday and afternoon  Blood sugars are infrequently checked later in the day and mostly are significantly higher except on one  evening  She does not check blood sugars much after meals and not clear which readings are postprandial  Has had low normal reading only once in the morning and none reported later in the day   Glucose monitoring:  done  1.5 times a day         Glucometer:  Accu-Chek Aviva       Blood Glucose readings from meter download:   PREMEAL Breakfast Lunch Dinner  9 PM  Overall  Glucose range:  67-275   199-304  ?   103-274    Mean/median:  177     199  197    Self-care: The diet that the patient has been following is: Lunch : Sometimes salad otherwise sandwiches  Meals: 3 meals per day. Bfst 9am; lunch 12 pm Supper at about 6-7 PM, mealtimes are variable          Physical activity: exercise: Walking on treadmill 10-15 min 3/7 days         Dietician visit: Most recent:.Several years ago           Wt Readings from Last 3 Encounters:  11/18/13 186 lb (84.369 kg)  10/05/13 185 lb (83.915 kg)  08/02/13 184 lb 3.2 oz (83.553 kg)   Previous A1c results, 9.9, 11.3  Lab Results  Component Value Date   HGBA1C 8.5* 08/02/2013   HGBA1C 8.5* 05/31/2013   HGBA1C 8.5* 03/18/2013   Lab Results  Component Value Date  MICROALBUR 2.4* 03/18/2013   LDLCALC 30 04/18/2009   CREATININE 2.3* 08/02/2013     Lab Results  Component Value Date   MICROALBUR 2.4* 03/18/2013      Medication List       This list is accurate as of: 11/18/13  3:52 PM.  Always use your most recent med list.               acetaminophen 500 MG tablet  Commonly known as:  TYLENOL  Take 500 mg by mouth every 6 (six) hours as needed for moderate pain.     allopurinol 100 MG tablet  Commonly known as:  ZYLOPRIM  Take 150 mg by mouth every morning.     benazepril 20 MG tablet  Commonly known as:  LOTENSIN  Take 10 mg by mouth every morning. Takes 1/2 tablet     COLCRYS 0.6 MG tablet  Generic drug:  colchicine  Take 0.6 mg by mouth daily as needed (gout flare).     COREG CR 40 MG 24 hr capsule  Generic drug:  carvedilol   TAKE ONE CAPSULE BY MOUTH DAILY.     digoxin 0.125 MG tablet  Commonly known as:  LANOXIN  Take 1 tablet (0.125 mg total) by mouth daily. Every day except Saturday and sunday     furosemide 80 MG tablet  Commonly known as:  LASIX  TAKE (1) TABLET BY MOUTH TWICE DAILY.     Insulin NPH (Human) (Isophane) 100 UNIT/ML Kiwkpen  Commonly known as:  HUMULIN N KWIKPEN  Inject 34 units daily at bedtime     LANTUS SOLOSTAR 100 UNIT/ML Solostar Pen  Generic drug:  Insulin Glargine  INJECT 40 UNITS SUBCUTANEOUSLY IN THE MORNING AND 30 UNITS AT BEDTIME.     NOVOLOG FLEXPEN 100 UNIT/ML FlexPen  Generic drug:  insulin aspart  INJECT SUBCUTANEOUSLY 12 UNITS BEFORE BREAKFAST; 14 UNITS AT LUNCH; AND 16 UNITS AT DINNER.     pantoprazole 40 MG tablet  Commonly known as:  PROTONIX  TAKE 1 TABLET BY MOUTH 30 MINS PRIOR TO YOUR FIRST MEAL.     spironolactone 25 MG tablet  Commonly known as:  ALDACTONE  Take 25 mg by mouth every morning.     VICTOZA 18 MG/3ML Sopn  Generic drug:  Liraglutide  Inject 0.6 mg into the skin daily with supper.     warfarin 5 MG tablet  Commonly known as:  COUMADIN  Take 1/2 tablet daily except 1 tablet on Mondays and Thursdays        Allergies:  Allergies  Allergen Reactions  . Wheat Swelling  . Latex Rash  . Penicillins Rash  . Sulfa Antibiotics Rash    Past Medical History  Diagnosis Date  . Congenital third degree heart block     a. Guidant VVI pacemaker implanted in 09/1997; b. gen change in 01/2004;  c. 03/2013 upgrade to [place], ser# [id number].  Marland Kitchen Chronic atrial fibrillation     embolic rind  . Nonischemic cardiomyopathy     a. 03/2013 Echo: Sev LV dysfxn with sev diff HK, restrictive phys, diast dysfxn, mild MR, sev dil LA/RA, mod PR, PASP 69mmHg;  b. 03/2013: BSX Energen CRTD BiV ICC, ser# [id number].  Marland Kitchen Chronic anticoagulation     a. coumadin.  Marland Kitchen Helicobacter pylori gastritis 08/14/2011  . Dysphagia 08/14/2011    FEB 2013 EGD/DIL 16  MM; GERD; h/o gastroesophageal reflux disease; + H. Pylori gastritis   . Dizziness and giddiness 05/19/2012  .  Automatic implantable cardioverter-defibrillator in situ     a. 03/2013: BSX Energen CRTD BiV ICC, ser# [id number]  . Chronic kidney disease     creatinine- 1.8 in 09/2006 and 2.0 in 05/2007; 2.05 in 2011, 2.29 in 2012  . Kidney stones 1990's  . Chronic systolic CHF (congestive heart failure)     a. 03/2013 Echo: Sev LV dysfxn with sev diff HK, restrictive phys, diast dysfxn, mild MR, sev dil LA/RA, mod PR, PASP 72mmHg.  Marland Kitchen IDDM (insulin dependent diabetes mellitus)   . GERD (gastroesophageal reflux disease)   . Stroke 1999    denies residual on 04/21/2013    Past Surgical History  Procedure Laterality Date  . Colonoscopy  04/23/2011    [id number] COLON POLYP/ INTERNAL HEMORRHOIDS/ TORTUOUS COLON  . Upper gastrointestinal endoscopy  FEB 2013    RING/DIL TO 16 MM, H PYLORI GASTRITIS  . Bi-ventricular implantable cardioverter defibrillator  (crt-d)  04/21/2013  . Pacemaker generator change  01/2004  . Insert / replace / remove pacemaker  09/1997    Guidant VVI [employer]    Family History  Problem Relation Age of Onset  . Adopted: Yes  . Colon cancer Neg Hx     Social History:  reports that she has never smoked. She has never used smokeless tobacco. She reports that she does not drink alcohol or use illicit drugs.    Review of Systems:   She has had stable chronic renal insufficiency, followed by nephrologist. Etiology unclear. No history of microalbuminuria Creatinine not checked recently  She has had cardiomyopathy and atrial fibrillation followed by cardiologist  Lipids: Has dyslipidemia with low HDL, high triglycerides  Lab Results  Component Value Date   CHOL 144 01/27/2013   HDL 23.00* 01/27/2013   LDLCALC 30 04/18/2009   LDLDIRECT 93.1 01/27/2013   TRIG 209.0* 01/27/2013   CHOLHDL 6 01/27/2013   Foot exam in 6/15 normal except absent pedal pulses on the  right  Physical Examination:  BP 120/62  Pulse 71  Temp(Src) 97.7 F (36.5 C) (Oral)  Ht 5\' 6"  (1.676 m)  Wt 186 lb (84.369 kg)  BMI 30.04 kg/m2  SpO2 97%      She has  no pedal edema   ASSESSMENT:  Diabetes type 1:   Her blood sugars are overall highest than her last visit and not clear of the reasons why She thinks she is compliant with her insulin doses as discussed previously However she is taking a little less Victoza than she was doing on the last time and did not increase the dose to 1.2 mg as directed even though she has not had any side effects from this See history of present illness for detailed discussion of her current blood sugar patterns, treatment regimen, meal planning and problems identified Although she is exercising she is not able to lose much weight Overall she still has poor understanding of balancing her meals and insulin and complying with her NovoLog insulin before eating Has need for improvement of the following:  More postprandial readings  Still has high fasting readings  More consistent diet with consistent amount of carbohydrate at each meal  Avoiding late night snacks especially high-fat foods  Knowledge of meal planning  Needs better compliance with mealtime insulin to be taken before eating  She needs to adjust her mealtime coverage for amount of food or carbohydrate especially at suppertime to avoid fluctuation in blood sugar  CKD: Stable function  PLAN:    Discussed needing to  check blood sugars more consistently after meals including breakfast and lunch  More consistent mealtime coverage and adjust the dose based on carbohydrate and total calorie intake  Increase bedtime NPH by 4 units  Consultation with diabetes educator after keeping a three-day food, glucose and insulin diary  Given outline of foods to have for snacks with 15 g of carbohydrate  Try a higher dose of Victoza using 1.2 mg Victoza  Check A1c again  today  Counseling time over 50% of today's 25 minute visit  Patient Instructions  Victoza 1.2 mg daily and call if sugars low  When sugars before supper are below 100 then reduce am Lantus to 35 and Novolog at lunch to 8-10  NOVOLOG MUST be taken before eating  Novolog 18 at supper  Humulin N 34 at bedtime not 30, am sugar should be 90-130  Please check blood sugars at least half the time about 2 hours after any meal and 3-4 times per week on waking up.  Please bring blood sugar monitor to each visit              Amaury Kuzel 11/18/2013, 3:52 PM

## 2013-11-18 NOTE — Patient Instructions (Addendum)
Victoza 1.2 mg daily and call if sugars low  When sugars before supper are below 100 then reduce am Lantus to 35 and Novolog at lunch to 8-10  NOVOLOG MUST be taken before eating  Novolog 18 at supper  Humulin N 34 at bedtime not 30, am sugar should be 90-130  Please check blood sugars at least half the time about 2 hours after any meal and 3-4 times per week on waking up.  Please bring blood sugar monitor to each visit

## 2013-11-19 ENCOUNTER — Encounter (HOSPITAL_COMMUNITY): Payer: Medicaid Other

## 2013-11-19 ENCOUNTER — Encounter: Payer: Medicaid Other | Admitting: Vascular Surgery

## 2013-11-19 LAB — LDL CHOLESTEROL, DIRECT: Direct LDL: 82.8 mg/dL

## 2013-11-19 LAB — HEMOGLOBIN A1C: Hgb A1c MFr Bld: 9.3 % — ABNORMAL HIGH (ref 4.6–6.5)

## 2013-11-19 LAB — GLUCOSE, RANDOM: GLUCOSE: 177 mg/dL — AB (ref 70–99)

## 2013-11-19 LAB — URINALYSIS, ROUTINE W REFLEX MICROSCOPIC
BILIRUBIN URINE: NEGATIVE
Ketones, ur: NEGATIVE
Leukocytes, UA: NEGATIVE
Nitrite: NEGATIVE
Specific Gravity, Urine: 1.01 (ref 1.000–1.030)
Total Protein, Urine: NEGATIVE
Urine Glucose: NEGATIVE
Urobilinogen, UA: 0.2 (ref 0.0–1.0)
pH: 5.5 (ref 5.0–8.0)

## 2013-11-19 LAB — MICROALBUMIN / CREATININE URINE RATIO
Creatinine,U: 234.7 mg/dL
Microalb Creat Ratio: 2.6 mg/g (ref 0.0–30.0)
Microalb, Ur: 6.1 mg/dL — ABNORMAL HIGH (ref 0.0–1.9)

## 2013-11-19 LAB — LIPID PANEL
CHOLESTEROL: 172 mg/dL (ref 0–200)
HDL: 24.1 mg/dL — AB (ref >39.00)
NonHDL: 147.9
Total CHOL/HDL Ratio: 7
Triglycerides: 325 mg/dL — ABNORMAL HIGH (ref 0.0–149.0)
VLDL: 65 mg/dL — AB (ref 0.0–40.0)

## 2013-11-21 NOTE — Progress Notes (Signed)
Quick Note:  A1c is higher at 9.3, blood fats are high, reduce high-fat foods and carbohydrates ______

## 2013-11-30 ENCOUNTER — Encounter: Payer: BC Managed Care – PPO | Attending: Endocrinology | Admitting: Nutrition

## 2013-11-30 DIAGNOSIS — E118 Type 2 diabetes mellitus with unspecified complications: Secondary | ICD-10-CM | POA: Diagnosis not present

## 2013-11-30 DIAGNOSIS — IMO0002 Reserved for concepts with insufficient information to code with codable children: Secondary | ICD-10-CM

## 2013-11-30 DIAGNOSIS — Z713 Dietary counseling and surveillance: Secondary | ICD-10-CM | POA: Diagnosis not present

## 2013-11-30 DIAGNOSIS — E1165 Type 2 diabetes mellitus with hyperglycemia: Secondary | ICD-10-CM

## 2013-11-30 NOTE — Progress Notes (Signed)
Ms. Normoyle is here today to review blood sugars and insulin doses.  Her meter down load shows that she is testing twice daily.   FBSs: 96-134.  2hr.pcB: 180,252,,acL: 129,  2hr. PcL: 223 (she drank a sweet tea),  AcS:106, 2hr.pcS: 227, 180, 93 last night.    Meals are not balanced. Bfast is dry toast, or grits with butter.                                          Lunch: salad with veg. and salad dressing, no crackers,  Water to drink.                                        Supper: meat-2-3ounces usually, one starchy veg., and one green veg.  No bread and diet drink or water.       Denies eating between meals or at bedtime. ???   Says Victoza is helping her to eat less and not eat between meals.  We discussed then need to change the Novolog dose for varying sizes of carbohydrates eaten. We reviewed what carbs were, and the need to take more Novolog for meals that have more carbs in them.  She reports that she is not eating many carbs, and wanted to see a dietitian so that she can know how much of every food group she should be eating.  She was given the nutrition center's phone number and told to call for an appt.   We also discussed pattern management and the need to look for patterns of low and high blood sugars at specific times during the day and reviewed what insulins worked to bring down what blood sugar readings.  She did not want to know this, saying that she first wanted to know how much she should be eating.  She was given a list of carb choices, and told to eat no more than 2-3 servings at each meal, until she sees the dietitian for a more detailed diet.  She agreed to do this.

## 2013-12-01 NOTE — Patient Instructions (Signed)
1.  Call nutrition and diabetes center for appt. With dietitian.  2.  Limit carb servings to no between 2-3 servings at each meal.

## 2013-12-06 ENCOUNTER — Other Ambulatory Visit: Payer: Self-pay | Admitting: *Deleted

## 2013-12-06 MED ORDER — INSULIN ASPART 100 UNIT/ML FLEXPEN: PEN_INJECTOR | SUBCUTANEOUS | Status: DC

## 2013-12-08 ENCOUNTER — Encounter: Payer: Self-pay | Admitting: *Deleted

## 2013-12-15 ENCOUNTER — Telehealth: Payer: Self-pay | Admitting: *Deleted

## 2013-12-15 NOTE — Telephone Encounter (Signed)
Pt has appt for teeth cleaning and she needs to know when she should stop coumadin. She states she already has antibiotic for it. She would like a call this morning.

## 2013-12-15 NOTE — Telephone Encounter (Signed)
Informed pt she does not need to come off coumadin for regular dental cleaning,  She verbalized understanding.

## 2013-12-20 ENCOUNTER — Ambulatory Visit (INDEPENDENT_AMBULATORY_CARE_PROVIDER_SITE_OTHER): Payer: BC Managed Care – PPO | Admitting: *Deleted

## 2013-12-20 DIAGNOSIS — I4891 Unspecified atrial fibrillation: Secondary | ICD-10-CM

## 2013-12-20 DIAGNOSIS — Z5181 Encounter for therapeutic drug level monitoring: Secondary | ICD-10-CM

## 2013-12-20 LAB — POCT INR: INR: 3.6

## 2013-12-25 ENCOUNTER — Other Ambulatory Visit: Payer: Self-pay | Admitting: Cardiovascular Disease

## 2013-12-25 ENCOUNTER — Other Ambulatory Visit: Payer: Self-pay | Admitting: Endocrinology

## 2014-01-06 ENCOUNTER — Encounter: Payer: Self-pay | Admitting: Vascular Surgery

## 2014-01-07 ENCOUNTER — Encounter (HOSPITAL_COMMUNITY): Payer: BC Managed Care – PPO

## 2014-01-07 ENCOUNTER — Encounter: Payer: BC Managed Care – PPO | Admitting: Vascular Surgery

## 2014-01-10 ENCOUNTER — Encounter: Payer: Self-pay | Admitting: *Deleted

## 2014-01-17 ENCOUNTER — Other Ambulatory Visit: Payer: Self-pay | Admitting: Endocrinology

## 2014-01-19 ENCOUNTER — Ambulatory Visit (INDEPENDENT_AMBULATORY_CARE_PROVIDER_SITE_OTHER): Payer: BC Managed Care – PPO | Admitting: *Deleted

## 2014-01-19 DIAGNOSIS — I4891 Unspecified atrial fibrillation: Secondary | ICD-10-CM | POA: Diagnosis not present

## 2014-01-19 DIAGNOSIS — Z5181 Encounter for therapeutic drug level monitoring: Secondary | ICD-10-CM

## 2014-01-19 LAB — POCT INR: INR: 2.8

## 2014-01-22 ENCOUNTER — Other Ambulatory Visit: Payer: Self-pay | Admitting: Endocrinology

## 2014-01-23 NOTE — Telephone Encounter (Signed)
Please schedule f/u before refilling

## 2014-01-28 ENCOUNTER — Other Ambulatory Visit: Payer: Self-pay | Admitting: *Deleted

## 2014-01-28 ENCOUNTER — Telehealth: Payer: Self-pay | Admitting: Endocrinology

## 2014-01-28 MED ORDER — LIRAGLUTIDE 18 MG/3ML ~~LOC~~ SOPN: PEN_INJECTOR | SUBCUTANEOUS | Status: DC

## 2014-01-28 NOTE — Telephone Encounter (Signed)
Patient need refill on and  Prior Auth on medication Victoza 18 mg/6ml

## 2014-01-31 ENCOUNTER — Ambulatory Visit (INDEPENDENT_AMBULATORY_CARE_PROVIDER_SITE_OTHER): Payer: BC Managed Care – PPO | Admitting: *Deleted

## 2014-01-31 DIAGNOSIS — Z95810 Presence of automatic (implantable) cardiac defibrillator: Secondary | ICD-10-CM

## 2014-01-31 DIAGNOSIS — I429 Cardiomyopathy, unspecified: Secondary | ICD-10-CM

## 2014-01-31 NOTE — Progress Notes (Signed)
Remote ICD transmission.   

## 2014-02-03 ENCOUNTER — Encounter (HOSPITAL_COMMUNITY): Payer: Self-pay | Admitting: Internal Medicine

## 2014-02-05 LAB — MDC_IDC_ENUM_SESS_TYPE_REMOTE
Battery Remaining Percentage: 100 %
Brady Statistic RA Percent Paced: 0 %
Date Time Interrogation Session: 20151207065100
HighPow Impedance: 67 Ohm
Implantable Pulse Generator Serial Number: 114945
Lead Channel Pacing Threshold Amplitude: 0.5 V
Lead Channel Pacing Threshold Amplitude: 0.8 V
Lead Channel Pacing Threshold Pulse Width: 0.5 ms
Lead Channel Pacing Threshold Pulse Width: 0.5 ms
Lead Channel Setting Pacing Amplitude: 2.4 V
Lead Channel Setting Pacing Amplitude: 2.4 V
Lead Channel Setting Pacing Pulse Width: 0.5 ms
Lead Channel Setting Sensing Sensitivity: 0.6 mV
MDC IDC MSMT BATTERY REMAINING LONGEVITY: 96 mo
MDC IDC MSMT LEADCHNL LV IMPEDANCE VALUE: 521 Ohm
MDC IDC MSMT LEADCHNL RV IMPEDANCE VALUE: 475 Ohm
MDC IDC SET LEADCHNL LV PACING PULSEWIDTH: 0.5 ms
MDC IDC SET LEADCHNL LV SENSING SENSITIVITY: 1 mV
MDC IDC STAT BRADY RV PERCENT PACED: 100 %
Zone Setting Detection Interval: 300 ms
Zone Setting Detection Interval: 353 ms

## 2014-02-11 ENCOUNTER — Encounter: Payer: Self-pay | Admitting: Cardiology

## 2014-02-15 ENCOUNTER — Encounter: Payer: Self-pay | Admitting: Internal Medicine

## 2014-02-16 ENCOUNTER — Ambulatory Visit (INDEPENDENT_AMBULATORY_CARE_PROVIDER_SITE_OTHER): Payer: BC Managed Care – PPO | Admitting: *Deleted

## 2014-02-16 DIAGNOSIS — I4891 Unspecified atrial fibrillation: Secondary | ICD-10-CM

## 2014-02-16 DIAGNOSIS — Z5181 Encounter for therapeutic drug level monitoring: Secondary | ICD-10-CM

## 2014-02-16 LAB — POCT INR: INR: 2.7

## 2014-03-01 ENCOUNTER — Encounter: Payer: Self-pay | Admitting: Cardiology

## 2014-03-02 ENCOUNTER — Encounter: Payer: Medicaid Other | Admitting: Gastroenterology

## 2014-03-02 NOTE — Progress Notes (Signed)
   Subjective:    Patient ID: [name], female    DOB: [birth year], 54 y.o.   MRN: [id number]  HPI   Past Medical History  Diagnosis Date  . Congenital third degree heart block     a. Guidant VVI pacemaker implanted in 09/1997; b. gen change in 01/2004;  c. 03/2013 upgrade to [place], ser# [id number].  Marland Kitchen Chronic atrial fibrillation     embolic rind  . Nonischemic cardiomyopathy     a. 03/2013 Echo: Sev LV dysfxn with sev diff HK, restrictive phys, diast dysfxn, mild MR, sev dil LA/RA, mod PR, PASP 50mmHg;  b. 03/2013: BSX Energen CRTD BiV ICC, ser# [id number].  Marland Kitchen Chronic anticoagulation     a. coumadin.  Marland Kitchen Helicobacter pylori gastritis 08/14/2011  . Dysphagia 08/14/2011    FEB 2013 EGD/DIL 16 MM; GERD; h/o gastroesophageal reflux disease; + H. Pylori gastritis   . Dizziness and giddiness 05/19/2012  . Automatic implantable cardioverter-defibrillator in situ     a. 03/2013: BSX Energen CRTD BiV ICC, ser# [id number]  . Chronic kidney disease     creatinine- 1.8 in 09/2006 and 2.0 in 05/2007; 2.05 in 2011, 2.29 in 2012  . Kidney stones 1990's  . Chronic systolic CHF (congestive heart failure)     a. 03/2013 Echo: Sev LV dysfxn with sev diff HK, restrictive phys, diast dysfxn, mild MR, sev dil LA/RA, mod PR, PASP 62mmHg.  Marland Kitchen IDDM (insulin dependent diabetes mellitus)   . GERD (gastroesophageal reflux disease)   . Stroke 1999    denies residual on 04/21/2013    Review of Systems     Objective:   Physical Exam        Assessment & Plan:

## 2014-03-14 ENCOUNTER — Other Ambulatory Visit: Payer: Self-pay | Admitting: Gastroenterology

## 2014-03-18 ENCOUNTER — Other Ambulatory Visit: Payer: Medicaid Other

## 2014-03-23 ENCOUNTER — Encounter: Payer: Self-pay | Admitting: *Deleted

## 2014-03-23 ENCOUNTER — Ambulatory Visit: Payer: Medicaid Other | Admitting: Endocrinology

## 2014-03-25 ENCOUNTER — Other Ambulatory Visit: Payer: Self-pay | Admitting: Endocrinology

## 2014-03-30 ENCOUNTER — Ambulatory Visit (INDEPENDENT_AMBULATORY_CARE_PROVIDER_SITE_OTHER): Payer: BLUE CROSS/BLUE SHIELD | Admitting: *Deleted

## 2014-03-30 DIAGNOSIS — I4891 Unspecified atrial fibrillation: Secondary | ICD-10-CM

## 2014-03-30 DIAGNOSIS — Z5181 Encounter for therapeutic drug level monitoring: Secondary | ICD-10-CM

## 2014-03-30 LAB — POCT INR: INR: 2.7

## 2014-03-31 ENCOUNTER — Encounter: Payer: Self-pay | Admitting: Gastroenterology

## 2014-03-31 ENCOUNTER — Ambulatory Visit (INDEPENDENT_AMBULATORY_CARE_PROVIDER_SITE_OTHER): Payer: Medicaid Other | Admitting: Gastroenterology

## 2014-03-31 VITALS — BP 122/73 | HR 68 | Temp 97.6°F | Ht 66.0 in | Wt 184.0 lb

## 2014-03-31 DIAGNOSIS — R1013 Epigastric pain: Secondary | ICD-10-CM | POA: Diagnosis not present

## 2014-03-31 NOTE — Patient Instructions (Addendum)
CONTINUE YOUR WEIGHT LOSS EFFORTS.  FOLLOW A LOW FAT DIET. SEE INFO BELOW.  AVOID REFLUX TRIGGERS. SEE INFO BELOW.  CONTINUE PROTONIX. TAKE 30 MINUTES PRIOR TO MEALS TWICE DAILY.  FOLLOW UP IN 1 YEAR.    Lifestyle and home remedies TO HELP CONTROL HEARTBURN.  You may eliminate or reduce the frequency of heartburn by making the following lifestyle changes:  . Control your weight. Being overweight is a major risk factor for heartburn and GERD. Excess pounds put pressure on your abdomen, pushing up your stomach and causing acid to back up into your esophagus.   . Eat smaller meals. 4 TO 6 MEALS A DAY. This reduces pressure on the lower esophageal sphincter, helping to prevent the valve from opening and acid from washing back into your esophagus.   Dolphus Jenny your belt. Clothes that fit tightly around your waist put pressure on your abdomen and the lower esophageal sphincter.  .  . Eliminate heartburn triggers. Everyone has specific triggers.  .   Common triggers such as fatty or fried foods, spicy food, tomato sauce, carbonated beverages, alcohol, chocolate, mint, garlic, onion, caffeine and nicotine may make heartburn worse.   Marland Kitchen Avoid stooping or bending. Tying your shoes is OK. Bending over for longer periods to weed your garden isn't, especially soon after eating.   . Don't lie down after a meal. Wait at least three to four hours after eating before going to bed, and don't lie down right after eating.   Marland Kitchen PLACE THE HEAD OF YOUR BED ON 6 INCH BLOCKS.  Alternative medicine . Several home remedies exist for treating GERD, but they provide only temporary relief. They include drinking baking soda (sodium bicarbonate) added to water or drinking other fluids such as baking soda mixed with cream of tartar and water. . Although these liquids create temporary relief by neutralizing, washing away or buffering acids, eventually they aggravate the situation by adding gas and fluid to your stomach,  increasing pressure and causing more acid reflux. Further, adding more sodium to your diet may increase your blood pressure and add stress to your heart, and excessive bicarbonate ingestion can alter the acid-base balance in your body.  Low-Fat Diet BREADS, CEREALS, PASTA, RICE, DRIED PEAS, AND BEANS These products are high in carbohydrates and most are low in fat. Therefore, they can be increased in the diet as substitutes for fatty foods. They too, however, contain calories and should not be eaten in excess. Cereals can be eaten for snacks as well as for breakfast.   FRUITS AND VEGETABLES It is good to eat fruits and vegetables. Besides being sources of fiber, both are rich in vitamins and some minerals. They help you get the daily allowances of these nutrients. Fruits and vegetables can be used for snacks and desserts.  MEATS Limit lean meat, chicken, [place], and fish to no more than 6 ounces per day. Beef, Pork, and Lamb Use lean cuts of beef, pork, and lamb. Lean cuts include:  Extra-lean ground beef.  Arm roast.  Sirloin tip.  Center-cut ham.  Round steak.  Loin chops.  Rump roast.  Tenderloin.  Trim all fat off the outside of meats before cooking. It is not necessary to severely decrease the intake of red meat, but lean choices should be made. Lean meat is rich in protein and contains a highly absorbable form of iron. Premenopausal women, in particular, should avoid reducing lean red meat because this could increase the risk for low red blood cells (  iron-deficiency anemia).  Chicken and [place] These are good sources of protein. The fat of poultry can be reduced by removing the skin and underlying fat layers before cooking. Chicken and [place] can be substituted for lean red meat in the diet. Poultry should not be fried or covered with high-fat sauces. Fish and Shellfish Fish is a good source of protein. Shellfish contain cholesterol, but they usually are low in saturated fatty acids.  The preparation of fish is important. Like chicken and [place], they should not be fried or covered with high-fat sauces. EGGS Egg whites contain no fat or cholesterol. They can be eaten often. Try 1 to 2 egg whites instead of whole eggs in recipes or use egg substitutes that do not contain yolk. MILK AND DAIRY PRODUCTS Use skim or 1% milk instead of 2% or whole milk. Decrease whole milk, natural, and processed cheeses. Use nonfat or low-fat (2%) cottage cheese or low-fat cheeses made from vegetable oils. Choose nonfat or low-fat (1 to 2%) yogurt. Experiment with evaporated skim milk in recipes that call for heavy cream. Substitute low-fat yogurt or low-fat cottage cheese for sour cream in dips and salad dressings. Have at least 2 servings of low-fat dairy products, such as 2 glasses of skim (or 1%) milk each day to help get your daily calcium intake. FATS AND OILS Reduce the total intake of fats, especially saturated fat. Butterfat, lard, and beef fats are high in saturated fat and cholesterol. These should be avoided as much as possible. Vegetable fats do not contain cholesterol, but certain vegetable fats, such as coconut oil, palm oil, and palm kernel oil are very high in saturated fats. These should be limited. These fats are often used in bakery goods, processed foods, popcorn, oils, and nondairy creamers. Vegetable shortenings and some peanut butters contain hydrogenated oils, which are also saturated fats. Read the labels on these foods and check for saturated vegetable oils. Unsaturated vegetable oils and fats do not raise blood cholesterol. However, they should be limited because they are fats and are high in calories. Total fat should still be limited to 30% of your daily caloric intake. Desirable liquid vegetable oils are corn oil, cottonseed oil, olive oil, canola oil, safflower oil, soybean oil, and sunflower oil. Peanut oil is not as good, but small amounts are acceptable. Buy a heart-healthy  tub margarine that has no partially hydrogenated oils in the ingredients. Mayonnaise and salad dressings often are made from unsaturated fats, but they should also be limited because of their high calorie and fat content. Seeds, nuts, peanut butter, olives, and avocados are high in fat, but the fat is mainly the unsaturated type. These foods should be limited mainly to avoid excess calories and fat. OTHER EATING TIPS Snacks  Most sweets should be limited as snacks. They tend to be rich in calories and fats, and their caloric content outweighs their nutritional value. Some good choices in snacks are graham crackers, melba toast, soda crackers, bagels (no egg), English muffins, fruits, and vegetables. These snacks are preferable to snack crackers, [place] fries, TORTILLA CHIPS, and POTATO chips. Popcorn should be air-popped or cooked in small amounts of liquid vegetable oil. Desserts Eat fruit, low-fat yogurt, and fruit ices instead of pastries, cake, and cookies. Sherbet, angel food cake, gelatin dessert, frozen low-fat yogurt, or other frozen products that do not contain saturated fat (pure fruit juice bars, frozen ice pops) are also acceptable.  COOKING METHODS Choose those methods that use little or no fat. They  include: Poaching.  Braising.  Steaming.  Grilling.  Baking.  Stir-frying.  Broiling.  Microwaving.  Foods can be cooked in a nonstick pan without added fat, or use a nonfat cooking spray in regular cookware. Limit fried foods and avoid frying in saturated fat. Add moisture to lean meats by using water, broth, cooking wines, and other nonfat or low-fat sauces along with the cooking methods mentioned above. Soups and stews should be chilled after cooking. The fat that forms on top after a few hours in the refrigerator should be skimmed off. When preparing meals, avoid using excess salt. Salt can contribute to raising blood pressure in some people.  EATING AWAY FROM HOME Order entres,  potatoes, and vegetables without sauces or butter. When meat exceeds the size of a deck of cards (3 to 4 ounces), the rest can be taken home for another meal. Choose vegetable or fruit salads and ask for low-calorie salad dressings to be served on the side. Use dressings sparingly. Limit high-fat toppings, such as bacon, crumbled eggs, cheese, sunflower seeds, and olives. Ask for heart-healthy tub margarine instead of butter.

## 2014-03-31 NOTE — Assessment & Plan Note (Signed)
SX CONTROLLED.  LOW FAT DIET CONTINUE PROTONIX. TAKE 30 MINUTES PRIOR TO MEALS TWICE DAILY. FOLLOW UP IN 1 YEAR.

## 2014-03-31 NOTE — Progress Notes (Signed)
Subjective:    Patient ID: [name], female    DOB: 10-Sep-1960, 54 y.o.   MRN: [id number] FANTA,TESFAYE, MD  HPI Drinks cold water and it makes her stomach spasm. Eats a particular ice a daughter's water. BmS: daily. NO MORE ACID REFLUX DUE TO PROTONIX.  PT DENIES FEVER, CHILLS, HEMATOCHEZIA, nausea, vomiting, melena, diarrhea, CHEST PAIN, SHORTNESS OF BREATH,  CHANGE IN BOWEL IN HABITS, constipation,  problems swallowing, or heartburn or indigestion.   Past Medical History  Diagnosis Date  . Congenital third degree heart block     a. Guidant VVI pacemaker implanted in 09/1997; b. gen change in 01/2004;  c. 03/2013 upgrade to [place], ser# [id number].  Marland Kitchen Chronic atrial fibrillation     embolic rind  . Nonischemic cardiomyopathy     a. 03/2013 Echo: Sev LV dysfxn with sev diff HK, restrictive phys, diast dysfxn, mild MR, sev dil LA/RA, mod PR, PASP 3mmHg;  b. 03/2013: BSX Energen CRTD BiV ICC, ser# [id number].  Marland Kitchen Chronic anticoagulation     a. coumadin.  Marland Kitchen Helicobacter pylori gastritis 08/14/2011  . Dysphagia 08/14/2011    FEB 2013 EGD/DIL 16 MM; GERD; h/o gastroesophageal reflux disease; + H. Pylori gastritis   . Dizziness and giddiness 05/19/2012  . Automatic implantable cardioverter-defibrillator in situ     a. 03/2013: BSX Energen CRTD BiV ICC, ser# [id number]  . Chronic kidney disease     creatinine- 1.8 in 09/2006 and 2.0 in 05/2007; 2.05 in 2011, 2.29 in 2012  . Kidney stones 1990's  . Chronic systolic CHF (congestive heart failure)     a. 03/2013 Echo: Sev LV dysfxn with sev diff HK, restrictive phys, diast dysfxn, mild MR, sev dil LA/RA, mod PR, PASP 45mmHg.  Marland Kitchen IDDM (insulin dependent diabetes mellitus)   . GERD (gastroesophageal reflux disease)   . Stroke 1999    denies residual on 04/21/2013   Past Surgical History  Procedure Laterality Date  . Colonoscopy  04/23/2011    [id number] COLON POLYP/ INTERNAL HEMORRHOIDS/ TORTUOUS COLON  . Upper gastrointestinal  endoscopy  FEB 2013    RING/DIL TO 16 MM, H PYLORI GASTRITIS  . Bi-ventricular implantable cardioverter defibrillator  (crt-d)  04/21/2013  . Pacemaker generator change  01/2004  . Insert / replace / remove pacemaker  09/1997    Guidant VVI [employer]  . Bi-ventricular implantable cardioverter defibrillator upgrade N/A 04/21/2013    Procedure: BI-VENTRICULAR IMPLANTABLE CARDIOVERTER DEFIBRILLATOR UPGRADE;  Surgeon: Evans Lance, MD;  Location: [facility] CATH LAB;  Service: Cardiovascular;  Laterality: N/A;   Allergies  Allergen Reactions  . Wheat Swelling  . Latex Rash  . Penicillins Rash  . Sulfa Antibiotics Rash    Current Outpatient Prescriptions  Medication Sig Dispense Refill  . acetaminophen (TYLENOL) 500 MG tablet Take 500 mg by mouth every 6 (six) hours as needed for moderate pain.    Marland Kitchen allopurinol (ZYLOPRIM) 100 MG tablet Take 150 mg by mouth every morning.     . benazepril (LOTENSIN) 20 MG tablet Take 10 mg by mouth every morning. Takes 1/2 tablet    . COLCRYS 0.6 MG tablet Take 0.6 mg by mouth daily as needed (gout flare).     . COREG CR 40 MG 24 hr capsule TAKE ONE CAPSULE BY MOUTH DAILY.    . furosemide (LASIX) 80 MG tablet TAKE (1) TABLET BY MOUTH TWICE DAILY.    Marland Kitchen HUMULIN N KWIKPEN 100 UNIT/ML Kiwkpen INJECT 34 UNITS SUBCUTANEOUSLY AT BEDTIME.    Marland Kitchen  insulin aspart (NOVOLOG FLEXPEN) 100 UNIT/ML FlexPen INJECT SUBCUTANEOUSLY 12 UNITS BEFORE BREAKFAST; 14 UNITS AT LUNCH; AND 16 UNITS AT DINNER.    Marland Kitchen LANOXIN 125 MCG tablet TAKE 1 TABLET BY MOUTH DAILY EXCEPT ON SATURDAY AND SUNDAY.    Marland Kitchen LANTUS SOLOSTAR 100 UNIT/ML Solostar Pen INJECT 40 UNITS SUBCUTANEOUSLY IN THE MORNING AND 30 UNITS AT BEDTIME.    Marland Kitchen Liraglutide (VICTOZA) 18 MG/3ML SOPN INJECT 1.2MG  SUBCUTANEOUSLY DAILY AS DIRECTED.    Marland Kitchen pantoprazole (PROTONIX) 40 MG tablet TAKE 1 TABLET BY MOUTH 30 MINS PRIOR TO YOUR FIRST MEAL.    Marland Kitchen spironolactone (ALDACTONE) 25 MG tablet Take 25 mg by mouth every morning.     . warfarin  (COUMADIN) 5 MG tablet Take 1/2 tablet daily except 1 tablet on Mondays and Thursdays     Review of Systems     Objective:   Physical Exam  Constitutional: She is oriented to person, place, and time. She appears well-developed and well-nourished. No distress.  HENT:  Head: Normocephalic and atraumatic.  Mouth/Throat: Oropharynx is clear and moist. No oropharyngeal exudate.  Eyes: Pupils are equal, round, and reactive to light. No scleral icterus.  Neck: Normal range of motion. Neck supple.  Cardiovascular: Normal rate and regular rhythm.   Pulmonary/Chest: Effort normal and breath sounds normal. No respiratory distress.  Abdominal: Soft. Bowel sounds are normal. She exhibits no distension. There is no tenderness.  Musculoskeletal: She exhibits no edema.  Neurological: She is alert and oriented to person, place, and time.  NO  NEW FOCAL DEFICITS   Psychiatric: She has a normal mood and affect.  Vitals reviewed.         Assessment & Plan:

## 2014-04-01 NOTE — Progress Notes (Signed)
cc'ed to pcp °

## 2014-04-11 ENCOUNTER — Other Ambulatory Visit: Payer: Self-pay | Admitting: Obstetrics & Gynecology

## 2014-04-14 ENCOUNTER — Telehealth: Payer: Self-pay | Admitting: Cardiovascular Disease

## 2014-04-14 MED ORDER — CARVEDILOL 12.5 MG PO TABS: 12.5000 mg | ORAL_TABLET | Freq: Two times a day (BID) | ORAL | Status: DC

## 2014-04-14 NOTE — Telephone Encounter (Signed)
Pt is on Medicaid and prescribed Coreg 40 mg controlled release at a cost of $300 vs regular Coreg is $20 Nate, Pharm-D at [facility] wonders if pt can be switched.If not, we must give reason why and obtain Prior Authorization  I have messaged Dr Bronson Ing

## 2014-04-14 NOTE — Telephone Encounter (Signed)
Please see refill bin / tgs  °

## 2014-04-14 NOTE — Telephone Encounter (Signed)
Escribed Coreg 12.5 mg BID per Dr Bronson Ing to [facility]. We d/c'd Coreg 40 mg Controlled release

## 2014-04-14 NOTE — Addendum Note (Signed)
Addended by: Barbarann Ehlers A on: 04/14/2014 11:22 AM   Modules accepted: Orders, Medications

## 2014-04-15 ENCOUNTER — Ambulatory Visit (INDEPENDENT_AMBULATORY_CARE_PROVIDER_SITE_OTHER): Payer: Medicaid Other | Admitting: Cardiovascular Disease

## 2014-04-15 ENCOUNTER — Encounter: Payer: Self-pay | Admitting: Cardiovascular Disease

## 2014-04-15 ENCOUNTER — Telehealth: Payer: Self-pay | Admitting: *Deleted

## 2014-04-15 VITALS — BP 112/78 | HR 60 | Ht 66.0 in | Wt 181.0 lb

## 2014-04-15 DIAGNOSIS — I4891 Unspecified atrial fibrillation: Secondary | ICD-10-CM

## 2014-04-15 DIAGNOSIS — I429 Cardiomyopathy, unspecified: Secondary | ICD-10-CM | POA: Diagnosis not present

## 2014-04-15 DIAGNOSIS — Z950 Presence of cardiac pacemaker: Secondary | ICD-10-CM | POA: Diagnosis not present

## 2014-04-15 DIAGNOSIS — I428 Other cardiomyopathies: Secondary | ICD-10-CM

## 2014-04-15 DIAGNOSIS — Z7901 Long term (current) use of anticoagulants: Secondary | ICD-10-CM

## 2014-04-15 DIAGNOSIS — Q246 Congenital heart block: Secondary | ICD-10-CM

## 2014-04-15 DIAGNOSIS — I10 Essential (primary) hypertension: Secondary | ICD-10-CM

## 2014-04-15 DIAGNOSIS — I5022 Chronic systolic (congestive) heart failure: Secondary | ICD-10-CM

## 2014-04-15 NOTE — Telephone Encounter (Signed)
Will have MD sign, LM asking pt if she wants to pick up or have me mail to her

## 2014-04-15 NOTE — Telephone Encounter (Signed)
Pt was seen today and forgot to ask for a handy cap sticker

## 2014-04-15 NOTE — Telephone Encounter (Signed)
Will forward to Dr. Koneswaran  

## 2014-04-15 NOTE — Telephone Encounter (Signed)
Ok to provide

## 2014-04-15 NOTE — Progress Notes (Signed)
Patient ID: [name], female   DOB: 08/22/1960, 54 y.o.   MRN: [id number]      SUBJECTIVE: The patient is a 54 year old woman with a history of congenital third degree heart block, atrial fibrillation, nonischemic cardiomyopathy s/p BiV ICD (04/21/13) with chronic systolic heart failure, and insulin-dependent diabetes mellitus. Echocardiogram on 04/16/2013 demonstrated severely reduced left ventricular systolic function, EF [id number], biventricular dilatation, mild mitral and severe tricuspid regurgitation. Device interrogation on 02/05/14 demonstrated normal device function with no ventricular arrhythmias and 100% biventricular pacing. She is doing very well and denies chest pain, palpitations, leg swelling , orthopnea, paroxysmal nocturnal dyspnea, and shortness of breath. She feels very well from a cardiovascular standpoint. Her primary issues related to multiple food allergies and intolerances.  Review of Systems: As per "subjective", otherwise negative.  Allergies  Allergen Reactions  . Wheat Swelling  . Latex Rash  . Penicillins Rash  . Sulfa Antibiotics Rash    Current Outpatient Prescriptions  Medication Sig Dispense Refill  . acetaminophen (TYLENOL) 500 MG tablet Take 500 mg by mouth every 6 (six) hours as needed for moderate pain.    Marland Kitchen allopurinol (ZYLOPRIM) 100 MG tablet Take 150 mg by mouth every morning.     . benazepril (LOTENSIN) 20 MG tablet Take 10 mg by mouth every morning. Takes 1/2 tablet    . carvedilol (COREG) 12.5 MG tablet Take 1 tablet (12.5 mg total) by mouth 2 (two) times daily. 180 tablet 3  . COLCRYS 0.6 MG tablet Take 0.6 mg by mouth daily as needed (gout flare).     Marland Kitchen HUMULIN N KWIKPEN 100 UNIT/ML Kiwkpen INJECT 34 UNITS SUBCUTANEOUSLY AT BEDTIME. 15 mL 3  . insulin aspart (NOVOLOG FLEXPEN) 100 UNIT/ML FlexPen INJECT SUBCUTANEOUSLY 12 UNITS BEFORE BREAKFAST; 14 UNITS AT LUNCH; AND 16 UNITS AT DINNER. 15 mL 3  . LANOXIN 125 MCG tablet TAKE 1 TABLET BY MOUTH  DAILY EXCEPT ON SATURDAY AND SUNDAY. 20 tablet 3  . LANTUS SOLOSTAR 100 UNIT/ML Solostar Pen INJECT 40 UNITS SUBCUTANEOUSLY IN THE MORNING AND 30 UNITS AT BEDTIME. 15 mL 3  . Liraglutide (VICTOZA) 18 MG/3ML SOPN INJECT 1.2MG  SUBCUTANEOUSLY DAILY AS DIRECTED. 6 mL 1  . pantoprazole (PROTONIX) 40 MG tablet TAKE 1 TABLET BY MOUTH 30 MINS PRIOR TO YOUR FIRST MEAL. 30 tablet 5  . spironolactone (ALDACTONE) 25 MG tablet Take 25 mg by mouth every morning.     . warfarin (COUMADIN) 5 MG tablet Take 1/2 tablet daily except 1 tablet on Mondays and Thursdays 30 tablet 6   No current facility-administered medications for this visit.    Past Medical History  Diagnosis Date  . Congenital third degree heart block     a. Guidant VVI pacemaker implanted in 09/1997; b. gen change in 01/2004;  c. 03/2013 upgrade to [place], ser# [id number].  Marland Kitchen Chronic atrial fibrillation     embolic rind  . Nonischemic cardiomyopathy     a. 03/2013 Echo: Sev LV dysfxn with sev diff HK, restrictive phys, diast dysfxn, mild MR, sev dil LA/RA, mod PR, PASP 21mmHg;  b. 03/2013: BSX Energen CRTD BiV ICC, ser# [id number].  Marland Kitchen Chronic anticoagulation     a. coumadin.  Marland Kitchen Helicobacter pylori gastritis 08/14/2011  . Dysphagia 08/14/2011    FEB 2013 EGD/DIL 16 MM; GERD; h/o gastroesophageal reflux disease; + H. Pylori gastritis   . Dizziness and giddiness 05/19/2012  . Automatic implantable cardioverter-defibrillator in situ     a. 03/2013: BSX Energen CRTD  BiV ICC, ser# [id number]  . Chronic kidney disease     creatinine- 1.8 in 09/2006 and 2.0 in 05/2007; 2.05 in 2011, 2.29 in 2012  . Kidney stones 1990's  . Chronic systolic CHF (congestive heart failure)     a. 03/2013 Echo: Sev LV dysfxn with sev diff HK, restrictive phys, diast dysfxn, mild MR, sev dil LA/RA, mod PR, PASP 70mmHg.  Marland Kitchen IDDM (insulin dependent diabetes mellitus)   . GERD (gastroesophageal reflux disease)   . Stroke 1999    denies residual on 04/21/2013    Past  Surgical History  Procedure Laterality Date  . Colonoscopy  04/23/2011    [id number] COLON POLYP/ INTERNAL HEMORRHOIDS/ TORTUOUS COLON  . Upper gastrointestinal endoscopy  FEB 2013    RING/DIL TO 16 MM, H PYLORI GASTRITIS  . Bi-ventricular implantable cardioverter defibrillator  (crt-d)  04/21/2013  . Pacemaker generator change  01/2004  . Insert / replace / remove pacemaker  09/1997    Guidant VVI [employer]  . Bi-ventricular implantable cardioverter defibrillator upgrade N/A 04/21/2013    Procedure: BI-VENTRICULAR IMPLANTABLE CARDIOVERTER DEFIBRILLATOR UPGRADE;  Surgeon: Evans Lance, MD;  Location: [facility] CATH LAB;  Service: Cardiovascular;  Laterality: N/A;    History   Social History  . Marital Status: Married    Spouse Name: N/A  . Number of Children: 4  . Years of Education: N/A   Occupational History  .     Social History Main Topics  . Smoking status: Never Smoker   . Smokeless tobacco: Never Used  . Alcohol Use: No  . Drug Use: No  . Sexual Activity: Yes   Other Topics Concern  . Not on file   Social History Narrative     Filed Vitals:   04/15/14 0952  BP: 112/78  Pulse: 60  Height: 5\' 6"  (1.676 m)  Weight: 181 lb (82.101 kg)  SpO2: 97%    PHYSICAL EXAM General: NAD  Neck: No JVD, no thyromegaly or thyroid nodule.  Lungs: Clear to auscultation bilaterally with normal respiratory effort.  CV: Nondisplaced PMI. Heart regular S1/split S2, no [id number], I/VI pansystolic murmur along left sternal border. No peripheral edema. No carotid bruit. Normal pedal pulses.  Abdomen: Soft, nontender, no hepatosplenomegaly, no distention.  Neurologic: Alert and oriented x 3.  Psych: Normal affect.  Extremities: No clubbing or cyanosis.  Skin: Normal. Musculoskeletal: Normal range of motion, no gross deformities.   ECG: Most recent ECG reviewed.      ASSESSMENT AND PLAN: 1. Nonischemic cardiomyopathy/chronic systolic heart failure s/p BiV ICD: EF  10-15% on 04/16/13. Currently with NYHA class I symptoms. Continue current diuretic regimen with furosemide and spironolactone. Device interrogation on 02/05/2014 demonstrated no mode switches, no ventricular arrhythmias, and 100% biventricular pacing. Will repeat echocardiogram to assess for interval improvement in LV systolic function.  2. Essential HTN: Well controlled on present therapy. No changes.  3. Atrial fibrillation: Heart rate is well controlled on carvedilol and digoxin. She maintains anticoagulation with warfarin and is without bleeding problems. INR 2.7 on 03/30/14.  4. Congenital third-degree heart block with pacemaker: Stable and followed by Dr. Lovena Le.     Dispo: f/u 1 year.   Kate Sable, M.D., F.A.C.C.

## 2014-04-15 NOTE — Patient Instructions (Signed)
Your physician wants you to follow-up in: 1 year with Dr Virgina Jock will receive a reminder letter in the mail two months in advance. If you don't receive a letter, please call our office to schedule the follow-up appointment.    Your physician recommends that you continue on your current medications as directed. Please refer to the Current Medication list given to you today.    Your physician has requested that you have an echocardiogram. Echocardiography is a painless test that uses sound waves to create images of your heart. It provides your doctor with information about the size and shape of your heart and how well your heart's chambers and valves are working. This procedure takes approximately one hour. There are no restrictions for this procedure.     Thank you for choosing [facility] !

## 2014-04-18 ENCOUNTER — Other Ambulatory Visit: Payer: Self-pay | Admitting: Cardiovascular Disease

## 2014-04-19 ENCOUNTER — Telehealth: Payer: Self-pay

## 2014-04-19 ENCOUNTER — Other Ambulatory Visit: Payer: Self-pay

## 2014-04-19 ENCOUNTER — Ambulatory Visit (HOSPITAL_COMMUNITY)
Admission: RE | Admit: 2014-04-19 | Discharge: 2014-04-19 | Disposition: A | Discharge status: Home / Self Care | Payer: Medicaid Other | Source: Ambulatory Visit | Attending: Cardiovascular Disease | Admitting: Cardiovascular Disease

## 2014-04-19 ENCOUNTER — Other Ambulatory Visit: Payer: Self-pay | Admitting: *Deleted

## 2014-04-19 DIAGNOSIS — I071 Rheumatic tricuspid insufficiency: Secondary | ICD-10-CM | POA: Insufficient documentation

## 2014-04-19 DIAGNOSIS — I369 Nonrheumatic tricuspid valve disorder, unspecified: Secondary | ICD-10-CM

## 2014-04-19 DIAGNOSIS — I4891 Unspecified atrial fibrillation: Secondary | ICD-10-CM | POA: Diagnosis present

## 2014-04-19 DIAGNOSIS — E119 Type 2 diabetes mellitus without complications: Secondary | ICD-10-CM | POA: Insufficient documentation

## 2014-04-19 DIAGNOSIS — I428 Other cardiomyopathies: Secondary | ICD-10-CM

## 2014-04-19 DIAGNOSIS — Z950 Presence of cardiac pacemaker: Secondary | ICD-10-CM | POA: Diagnosis not present

## 2014-04-19 DIAGNOSIS — Q246 Congenital heart block: Secondary | ICD-10-CM | POA: Diagnosis not present

## 2014-04-19 MED ORDER — CARVEDILOL PHOSPHATE ER 40 MG PO CP24: 40.0000 mg | ORAL_CAPSULE | Freq: Every day | ORAL | Status: DC

## 2014-04-19 NOTE — Telephone Encounter (Signed)
Submitted PA # [id number]  To [employer] [id number] to see if patient qualifies for brand name Coreg.They have 24 hrs to review.LM telling patient this

## 2014-04-19 NOTE — Telephone Encounter (Signed)
We faxed to [facility] rx last week for brand name Coreg,will fax again

## 2014-04-19 NOTE — Progress Notes (Signed)
*  PRELIMINARY RESULTS* Echocardiogram 2D Echocardiogram has been performed.  Leavy Cella 04/19/2014, 11:39 AM

## 2014-04-19 NOTE — Telephone Encounter (Signed)
Pt needs coreg called in she can not take generic so it needs to say medically necessary for name brand. Pt is out of medication.

## 2014-04-21 ENCOUNTER — Telehealth: Payer: Self-pay

## 2014-04-21 NOTE — Telephone Encounter (Signed)
Coreg CR 40 mg approved by [employer] [place] [id number]   [employer], pt aware

## 2014-05-02 ENCOUNTER — Telehealth: Payer: Self-pay | Admitting: Endocrinology

## 2014-05-02 ENCOUNTER — Other Ambulatory Visit: Payer: Self-pay | Admitting: *Deleted

## 2014-05-02 MED ORDER — INSULIN GLARGINE 100 UNIT/ML SOLOSTAR PEN: PEN_INJECTOR | SUBCUTANEOUS | Status: DC

## 2014-05-02 MED ORDER — LIRAGLUTIDE 18 MG/3ML ~~LOC~~ SOPN: PEN_INJECTOR | SUBCUTANEOUS | Status: DC

## 2014-05-02 MED ORDER — INSULIN ASPART 100 UNIT/ML FLEXPEN: PEN_INJECTOR | SUBCUTANEOUS | Status: DC

## 2014-05-02 MED ORDER — INSULIN ISOPHANE HUMAN 100 UNIT/ML KWIKPEN: PEN_INJECTOR | SUBCUTANEOUS | Status: DC

## 2014-05-02 NOTE — Telephone Encounter (Signed)
Patient called stating she would need refills on her prescriptions   Rx: Victoza Lantus Novolin Novolog  Pharmacy: [facility]     Thank you

## 2014-05-11 ENCOUNTER — Ambulatory Visit (INDEPENDENT_AMBULATORY_CARE_PROVIDER_SITE_OTHER): Payer: Medicaid Other | Admitting: *Deleted

## 2014-05-11 DIAGNOSIS — Z5181 Encounter for therapeutic drug level monitoring: Secondary | ICD-10-CM | POA: Diagnosis not present

## 2014-05-11 DIAGNOSIS — I4891 Unspecified atrial fibrillation: Secondary | ICD-10-CM

## 2014-05-11 LAB — POCT INR: INR: 3

## 2014-05-12 ENCOUNTER — Other Ambulatory Visit: Payer: Self-pay | Admitting: Obstetrics & Gynecology

## 2014-05-12 DIAGNOSIS — Z1231 Encounter for screening mammogram for malignant neoplasm of breast: Secondary | ICD-10-CM

## 2014-05-16 ENCOUNTER — Other Ambulatory Visit: Payer: Self-pay | Admitting: Obstetrics & Gynecology

## 2014-05-18 ENCOUNTER — Ambulatory Visit (HOSPITAL_COMMUNITY)
Admission: RE | Admit: 2014-05-18 | Discharge: 2014-05-18 | Disposition: A | Discharge status: Home / Self Care | Payer: Medicaid Other | Source: Ambulatory Visit | Attending: Obstetrics & Gynecology | Admitting: Obstetrics & Gynecology

## 2014-05-18 DIAGNOSIS — Z1231 Encounter for screening mammogram for malignant neoplasm of breast: Secondary | ICD-10-CM | POA: Insufficient documentation

## 2014-05-20 ENCOUNTER — Encounter: Payer: Self-pay | Admitting: Internal Medicine

## 2014-05-20 ENCOUNTER — Other Ambulatory Visit: Payer: Self-pay | Admitting: Cardiovascular Disease

## 2014-05-20 ENCOUNTER — Ambulatory Visit (INDEPENDENT_AMBULATORY_CARE_PROVIDER_SITE_OTHER): Payer: Medicaid Other | Admitting: Internal Medicine

## 2014-05-20 VITALS — BP 120/82 | HR 65 | Ht 66.0 in | Wt 178.2 lb

## 2014-05-20 DIAGNOSIS — Z95810 Presence of automatic (implantable) cardiac defibrillator: Secondary | ICD-10-CM | POA: Diagnosis not present

## 2014-05-20 DIAGNOSIS — I5022 Chronic systolic (congestive) heart failure: Secondary | ICD-10-CM

## 2014-05-20 DIAGNOSIS — I482 Chronic atrial fibrillation, unspecified: Secondary | ICD-10-CM

## 2014-05-20 LAB — MDC_IDC_ENUM_SESS_TYPE_INCLINIC
Battery Remaining Longevity: 96 mo
Lead Channel Impedance Value: 487 Ohm
Lead Channel Impedance Value: 493 Ohm
Lead Channel Pacing Threshold Amplitude: 0.5 V
Lead Channel Pacing Threshold Amplitude: 0.7 V
Lead Channel Pacing Threshold Pulse Width: 0.5 ms
Lead Channel Pacing Threshold Pulse Width: 0.5 ms
Lead Channel Setting Pacing Amplitude: 2.4 V
Lead Channel Setting Pacing Pulse Width: 0.5 ms
Lead Channel Setting Sensing Sensitivity: 1 mV
MDC IDC PG SERIAL: 114945
MDC IDC SET LEADCHNL RV PACING AMPLITUDE: 2.4 V
MDC IDC SET LEADCHNL RV PACING PULSEWIDTH: 0.5 ms
MDC IDC SET LEADCHNL RV SENSING SENSITIVITY: 0.6 mV
MDC IDC STAT BRADY RV PERCENT PACED: 100 %
Zone Setting Detection Interval: 300 ms
Zone Setting Detection Interval: 353 ms

## 2014-05-20 NOTE — Progress Notes (Signed)
HPI Mrs. Touch returns today for followup. She is a pleasant 54 yo woman with a h/o chronic atrial fib and chb, s/p PPM who underwent upgrade to a BiV ICD a year ago. She had class 2b CHF, with pacing induced LBBB and a QRS duration of 170 ms. She feels well. Her husband states (as does the patient) that her energy level is better. No other complaints today. She is exercising regularly without limitation. She does admit to some dietary indiscretion. Allergies  Allergen Reactions  . Wheat Swelling  . Latex Rash  . Penicillins Rash  . Sulfa Antibiotics Rash     Current Outpatient Prescriptions  Medication Sig Dispense Refill  . acetaminophen (TYLENOL) 500 MG tablet Take 500 mg by mouth every 6 (six) hours as needed for moderate pain.    Marland Kitchen allopurinol (ZYLOPRIM) 100 MG tablet Take 150 mg by mouth every morning.     . benazepril (LOTENSIN) 20 MG tablet Take 10 mg by mouth every morning. Takes 1/2 tablet    . carvedilol (COREG CR) 40 MG 24 hr capsule Take 1 capsule (40 mg total) by mouth daily. 60 capsule 11  . COLCRYS 0.6 MG tablet Take 0.6 mg by mouth daily as needed (gout flare).     . insulin aspart (NOVOLOG FLEXPEN) 100 UNIT/ML FlexPen INJECT SUBCUTANEOUSLY 12 UNITS BEFORE BREAKFAST; 14 UNITS AT LUNCH; AND 16 UNITS AT DINNER. 15 mL 0  . Insulin Glargine (LANTUS SOLOSTAR) 100 UNIT/ML Solostar Pen INJECT 40 UNITS SUBCUTANEOUSLY IN THE MORNING AND 30 UNITS AT BEDTIME. 15 mL 0  . Insulin NPH, Human,, Isophane, (HUMULIN N KWIKPEN) 100 UNIT/ML Kiwkpen INJECT 34 UNITS SUBCUTANEOUSLY AT BEDTIME. 15 mL 0  . LANOXIN 125 MCG tablet TAKE 1 TABLET BY MOUTH DAILY EXCEPT ON SATURDAY AND SUNDAY. 20 tablet 3  . Liraglutide (VICTOZA) 18 MG/3ML SOPN INJECT 1.2MG  SUBCUTANEOUSLY DAILY AS DIRECTED. 6 mL 0  . pantoprazole (PROTONIX) 40 MG tablet TAKE 1 TABLET BY MOUTH 30 MINS PRIOR TO YOUR FIRST MEAL. 30 tablet 5  . spironolactone (ALDACTONE) 25 MG tablet Take 25 mg by mouth every morning.     .  warfarin (COUMADIN) 5 MG tablet Take 1/2 tablet daily except 1 tablet on Mondays and Thursdays 30 tablet 6   No current facility-administered medications for this visit.     Past Medical History  Diagnosis Date  . Congenital third degree heart block     a. Guidant VVI pacemaker implanted in 09/1997; b. gen change in 01/2004;  c. 03/2013 upgrade to [place], ser# [id number].  Marland Kitchen Chronic atrial fibrillation     embolic rind  . Nonischemic cardiomyopathy     a. 03/2013 Echo: Sev LV dysfxn with sev diff HK, restrictive phys, diast dysfxn, mild MR, sev dil LA/RA, mod PR, PASP 69mmHg;  b. 03/2013: BSX Energen CRTD BiV ICC, ser# [id number].  Marland Kitchen Chronic anticoagulation     a. coumadin.  Marland Kitchen Helicobacter pylori gastritis 08/14/2011  . Dysphagia 08/14/2011    FEB 2013 EGD/DIL 16 MM; GERD; h/o gastroesophageal reflux disease; + H. Pylori gastritis   . Dizziness and giddiness 05/19/2012  . Automatic implantable cardioverter-defibrillator in situ     a. 03/2013: BSX Energen CRTD BiV ICC, ser# [id number]  . Chronic kidney disease     creatinine- 1.8 in 09/2006 and 2.0 in 05/2007; 2.05 in 2011, 2.29 in 2012  . Kidney stones 1990's  . Chronic systolic CHF (congestive heart failure)  a. 03/2013 Echo: Sev LV dysfxn with sev diff HK, restrictive phys, diast dysfxn, mild MR, sev dil LA/RA, mod PR, PASP 61mmHg.  Marland Kitchen IDDM (insulin dependent diabetes mellitus)   . GERD (gastroesophageal reflux disease)   . Stroke 1999    denies residual on 04/21/2013    ROS:   All systems reviewed and negative except as noted in the HPI.   Past Surgical History  Procedure Laterality Date  . Colonoscopy  04/23/2011    [id number] COLON POLYP/ INTERNAL HEMORRHOIDS/ TORTUOUS COLON  . Upper gastrointestinal endoscopy  FEB 2013    RING/DIL TO 16 MM, H PYLORI GASTRITIS  . Bi-ventricular implantable cardioverter defibrillator  (crt-d)  04/21/2013  . Pacemaker generator change  01/2004  . Insert / replace / remove pacemaker   09/1997    Guidant VVI [employer]  . Bi-ventricular implantable cardioverter defibrillator upgrade N/A 04/21/2013    Procedure: BI-VENTRICULAR IMPLANTABLE CARDIOVERTER DEFIBRILLATOR UPGRADE;  Surgeon: Evans Lance, MD;  Location: [facility] CATH LAB;  Service: Cardiovascular;  Laterality: N/A;     Family History  Problem Relation Age of Onset  . Adopted: Yes  . Colon cancer Neg Hx      History   Social History  . Marital Status: Married    Spouse Name: N/A  . Number of Children: 4  . Years of Education: N/A   Occupational History  .     Social History Main Topics  . Smoking status: Never Smoker   . Smokeless tobacco: Never Used  . Alcohol Use: No  . Drug Use: No  . Sexual Activity: Yes   Other Topics Concern  . Not on file   Social History Narrative     BP 120/82 mmHg  Pulse 65  Ht 5\' 6"  (1.676 m)  Wt 178 lb 3.2 oz (80.831 kg)  BMI 28.78 kg/m2  SpO2 98%  Physical Exam:  Well appearing middle aged woman, NAD HEENT: Unremarkable Neck:  No JVD, no thyromegally Back:  No CVA tenderness Lungs:  Clear with no wheezes, rales, or rhonchi. No residual hematoma HEART:  Regular rate rhythm, no murmurs, no rubs, no clicks Abd:  soft, positive bowel sounds, no organomegally, no rebound, no guarding Ext:  2 plus pulses, no edema, no cyanosis, no clubbing Skin:  No rashes no nodules Neuro:  CN II through XII intact, motor grossly intact   DEVICE  Normal device function.  See PaceArt for details.   Assess/Plan:

## 2014-05-20 NOTE — Assessment & Plan Note (Signed)
Her [place] [employer] biV ICD is working normally. Will recheck in several months.

## 2014-05-20 NOTE — Assessment & Plan Note (Signed)
Her ventricular rate is well controlled. NO change in medications.

## 2014-05-20 NOTE — Patient Instructions (Signed)
Your physician wants you to follow-up in: 1 year with Dr. Taylor. You will receive a reminder letter in the mail two months in advance. If you don't receive a letter, please call our office to schedule the follow-up appointment.  Your physician recommends that you continue on your current medications as directed. Please refer to the Current Medication list given to you today.  Thank you for choosing [facility]!   

## 2014-05-20 NOTE — Assessment & Plan Note (Signed)
Her symptoms are class 2A. She will continue her current meds. I have encouraged a low sodium diet, exercise and some weight loss.

## 2014-05-24 ENCOUNTER — Other Ambulatory Visit (INDEPENDENT_AMBULATORY_CARE_PROVIDER_SITE_OTHER): Payer: Medicaid Other

## 2014-05-24 DIAGNOSIS — E1165 Type 2 diabetes mellitus with hyperglycemia: Secondary | ICD-10-CM | POA: Diagnosis not present

## 2014-05-24 DIAGNOSIS — IMO0002 Reserved for concepts with insufficient information to code with codable children: Secondary | ICD-10-CM

## 2014-05-24 DIAGNOSIS — E1129 Type 2 diabetes mellitus with other diabetic kidney complication: Secondary | ICD-10-CM

## 2014-05-24 LAB — GLUCOSE, RANDOM: GLUCOSE: 239 mg/dL — AB (ref 70–99)

## 2014-05-24 LAB — HEMOGLOBIN A1C: HEMOGLOBIN A1C: 9.8 % — AB (ref 4.6–6.5)

## 2014-05-26 ENCOUNTER — Ambulatory Visit (INDEPENDENT_AMBULATORY_CARE_PROVIDER_SITE_OTHER): Payer: Medicaid Other | Admitting: Obstetrics & Gynecology

## 2014-05-26 ENCOUNTER — Encounter: Payer: Self-pay | Admitting: Obstetrics & Gynecology

## 2014-05-26 ENCOUNTER — Other Ambulatory Visit (HOSPITAL_COMMUNITY)
Admission: RE | Admit: 2014-05-26 | Discharge: 2014-05-26 | Disposition: A | Discharge status: Home / Self Care | Payer: Medicaid Other | Source: Ambulatory Visit | Attending: Obstetrics & Gynecology | Admitting: Obstetrics & Gynecology

## 2014-05-26 VITALS — BP 120/60 | HR 72 | Ht 66.0 in | Wt 178.0 lb

## 2014-05-26 DIAGNOSIS — Z1212 Encounter for screening for malignant neoplasm of rectum: Secondary | ICD-10-CM

## 2014-05-26 DIAGNOSIS — Z1211 Encounter for screening for malignant neoplasm of colon: Secondary | ICD-10-CM

## 2014-05-26 DIAGNOSIS — Z01419 Encounter for gynecological examination (general) (routine) without abnormal findings: Secondary | ICD-10-CM | POA: Insufficient documentation

## 2014-05-26 DIAGNOSIS — Z0000 Encounter for general adult medical examination without abnormal findings: Secondary | ICD-10-CM

## 2014-05-26 NOTE — Progress Notes (Signed)
Patient ID: [name], female   DOB: [birth year], 54 y.o.   MRN: [id number] Subjective:     [name] is a 54 y.o. female here for a routine exam.  No LMP recorded. Patient is postmenopausal. No obstetric history on file. Birth Control Method:  na Menstrual Calendar(currently): amenorrheic  Current complaints: none.   Current acute medical issues:  none   Recent Gynecologic History No LMP recorded. Patient is postmenopausal. Last Pap: 2014,  normal Last mammogram: 2016,  normal  Past Medical History  Diagnosis Date  . Congenital third degree heart block     a. Guidant VVI pacemaker implanted in 09/1997; b. gen change in 01/2004;  c. 03/2013 upgrade to [place], ser# [id number].  Marland Kitchen Chronic atrial fibrillation     embolic rind  . Nonischemic cardiomyopathy     a. 03/2013 Echo: Sev LV dysfxn with sev diff HK, restrictive phys, diast dysfxn, mild MR, sev dil LA/RA, mod PR, PASP 63mmHg;  b. 03/2013: BSX Energen CRTD BiV ICC, ser# [id number].  Marland Kitchen Chronic anticoagulation     a. coumadin.  Marland Kitchen Helicobacter pylori gastritis 08/14/2011  . Dysphagia 08/14/2011    FEB 2013 EGD/DIL 16 MM; GERD; h/o gastroesophageal reflux disease; + H. Pylori gastritis   . Dizziness and giddiness 05/19/2012  . Automatic implantable cardioverter-defibrillator in situ     a. 03/2013: BSX Energen CRTD BiV ICC, ser# [id number]  . Chronic kidney disease     creatinine- 1.8 in 09/2006 and 2.0 in 05/2007; 2.05 in 2011, 2.29 in 2012  . Kidney stones 1990's  . Chronic systolic CHF (congestive heart failure)     a. 03/2013 Echo: Sev LV dysfxn with sev diff HK, restrictive phys, diast dysfxn, mild MR, sev dil LA/RA, mod PR, PASP 82mmHg.  Marland Kitchen IDDM (insulin dependent diabetes mellitus)   . GERD (gastroesophageal reflux disease)   . Stroke 1999    denies residual on 04/21/2013    Past Surgical History  Procedure Laterality Date  . Colonoscopy  04/23/2011    [id number] COLON POLYP/ INTERNAL HEMORRHOIDS/ TORTUOUS COLON   . Upper gastrointestinal endoscopy  FEB 2013    RING/DIL TO 16 MM, H PYLORI GASTRITIS  . Bi-ventricular implantable cardioverter defibrillator  (crt-d)  04/21/2013  . Pacemaker generator change  01/2004  . Insert / replace / remove pacemaker  09/1997    Guidant VVI [employer]  . Bi-ventricular implantable cardioverter defibrillator upgrade N/A 04/21/2013    Procedure: BI-VENTRICULAR IMPLANTABLE CARDIOVERTER DEFIBRILLATOR UPGRADE;  Surgeon: Evans Lance, MD;  Location: [facility] CATH LAB;  Service: Cardiovascular;  Laterality: N/A;    OB History    No data available      History   Social History  . Marital Status: Married    Spouse Name: N/A  . Number of Children: 4  . Years of Education: N/A   Occupational History  .     Social History Main Topics  . Smoking status: Never Smoker   . Smokeless tobacco: Never Used  . Alcohol Use: No  . Drug Use: No  . Sexual Activity: Yes   Other Topics Concern  . Not on file   Social History Narrative    Family History  Problem Relation Age of Onset  . Adopted: Yes  . Colon cancer Neg Hx      Current outpatient prescriptions:  .  acetaminophen (TYLENOL) 500 MG tablet, Take 500 mg by mouth every 6 (six) hours as needed for moderate pain., Disp: ,  Rfl:  .  allopurinol (ZYLOPRIM) 100 MG tablet, Take 150 mg by mouth every morning. , Disp: , Rfl:  .  benazepril (LOTENSIN) 20 MG tablet, Take 10 mg by mouth every morning. Takes 1/2 tablet, Disp: , Rfl:  .  carvedilol (COREG CR) 40 MG 24 hr capsule, Take 1 capsule (40 mg total) by mouth daily., Disp: 60 capsule, Rfl: 11 .  insulin aspart (NOVOLOG FLEXPEN) 100 UNIT/ML FlexPen, INJECT SUBCUTANEOUSLY 12 UNITS BEFORE BREAKFAST; 14 UNITS AT LUNCH; AND 16 UNITS AT DINNER., Disp: 15 mL, Rfl: 0 .  Insulin Glargine (LANTUS SOLOSTAR) 100 UNIT/ML Solostar Pen, INJECT 40 UNITS SUBCUTANEOUSLY IN THE MORNING AND 30 UNITS AT BEDTIME., Disp: 15 mL, Rfl: 0 .  Insulin NPH, Human,, Isophane, (HUMULIN N  KWIKPEN) 100 UNIT/ML Kiwkpen, INJECT 34 UNITS SUBCUTANEOUSLY AT BEDTIME., Disp: 15 mL, Rfl: 0 .  LANOXIN 125 MCG tablet, TAKE 1 TABLET BY MOUTH DAILY EXCEPT ON SATURDAY AND SUNDAY., Disp: 20 tablet, Rfl: 6 .  pantoprazole (PROTONIX) 40 MG tablet, TAKE 1 TABLET BY MOUTH 30 MINS PRIOR TO YOUR FIRST MEAL., Disp: 30 tablet, Rfl: 5 .  spironolactone (ALDACTONE) 25 MG tablet, Take 25 mg by mouth every morning. , Disp: , Rfl:  .  warfarin (COUMADIN) 5 MG tablet, Take 1/2 tablet daily except 1 tablet on Mondays and Thursdays, Disp: 30 tablet, Rfl: 6 .  COLCRYS 0.6 MG tablet, Take 0.6 mg by mouth daily as needed (gout flare). , Disp: , Rfl:  .  Liraglutide (VICTOZA) 18 MG/3ML SOPN, INJECT 1.2MG  SUBCUTANEOUSLY DAILY AS DIRECTED. (Patient not taking: Reported on 05/26/2014), Disp: 6 mL, Rfl: 0  Review of Systems  Review of Systems  Constitutional: Negative for fever, chills, weight loss, malaise/fatigue and diaphoresis.  HENT: Negative for hearing loss, ear pain, nosebleeds, congestion, sore throat, neck pain, tinnitus and ear discharge.   Eyes: Negative for blurred vision, double vision, photophobia, pain, discharge and redness.  Respiratory: Negative for cough, hemoptysis, sputum production, shortness of breath, wheezing and stridor.   Cardiovascular: Negative for chest pain, palpitations, orthopnea, claudication, leg swelling and PND.  Gastrointestinal: negative for abdominal pain. Negative for heartburn, nausea, vomiting, diarrhea, constipation, blood in stool and melena.  Genitourinary: Negative for dysuria, urgency, frequency, hematuria and flank pain.  Musculoskeletal: Negative for myalgias, back pain, joint pain and falls.  Skin: Negative for itching and rash.  Neurological: Negative for dizziness, tingling, tremors, sensory change, speech change, focal weakness, seizures, loss of consciousness, weakness and headaches.  Endo/Heme/Allergies: Negative for environmental allergies and polydipsia. Does  not bruise/bleed easily.  Psychiatric/Behavioral: Negative for depression, suicidal ideas, hallucinations, memory loss and substance abuse. The patient is not nervous/anxious and does not have insomnia.        Objective:  Blood pressure 120/60, pulse 72, height 5\' 6"  (1.676 m), weight 178 lb (80.74 kg).   Physical Exam  Vitals reviewed. Constitutional: She is oriented to person, place, and time. She appears well-developed and well-nourished.  HENT:  Head: Normocephalic and atraumatic.        Right Ear: External ear normal.  Left Ear: External ear normal.  Nose: Nose normal.  Mouth/Throat: Oropharynx is clear and moist.  Eyes: Conjunctivae and EOM are normal. Pupils are equal, round, and reactive to light. Right eye exhibits no discharge. Left eye exhibits no discharge. No scleral icterus.  Neck: Normal range of motion. Neck supple. No tracheal deviation present. No thyromegaly present.  Cardiovascular: Normal rate, regular rhythm, normal heart sounds and intact distal pulses.  Exam  reveals no gallop and no friction rub.   No murmur heard. Respiratory: Effort normal and breath sounds normal. No respiratory distress. She has no wheezes. She has no rales. She exhibits no tenderness.  GI: Soft. Bowel sounds are normal. She exhibits no distension and no mass. There is no tenderness. There is no rebound and no guarding.  Genitourinary:  Breasts no masses skin changes or nipple changes bilaterally      Vulva is normal without lesions Vagina is pink moist without discharge Cervix normal in appearance and pap is done Uterus is normal size shape and contour Adnexa is negative with normal sized ovaries  {Rectal    hemoccult positive, normal tone, no masses  Musculoskeletal: Normal range of motion. She exhibits no edema and no tenderness.  Neurological: She is alert and oriented to person, place, and time. She has normal reflexes. She displays normal reflexes. No cranial nerve deficit. She  exhibits normal muscle tone. Coordination normal.  Skin: Skin is warm and dry. No rash noted. No erythema. No pallor.  Psychiatric: She has a normal mood and affect. Her behavior is normal. Judgment and thought content normal.       Assessment:     normal gyn exam .   +hemeoccult on exam no masses, on coumadin Plan:    Mammogram ordered. Follow up in: 1 year.

## 2014-05-27 ENCOUNTER — Ambulatory Visit: Payer: Medicaid Other | Admitting: Endocrinology

## 2014-05-27 LAB — CYTOLOGY - PAP

## 2014-05-28 ENCOUNTER — Other Ambulatory Visit: Payer: Self-pay | Admitting: Endocrinology

## 2014-06-07 ENCOUNTER — Encounter: Payer: Self-pay | Admitting: Endocrinology

## 2014-06-07 ENCOUNTER — Ambulatory Visit (INDEPENDENT_AMBULATORY_CARE_PROVIDER_SITE_OTHER): Payer: Medicaid Other | Admitting: Endocrinology

## 2014-06-07 VITALS — BP 128/72 | HR 77 | Temp 97.7°F | Ht 66.0 in | Wt 181.4 lb

## 2014-06-07 DIAGNOSIS — N184 Chronic kidney disease, stage 4 (severe): Secondary | ICD-10-CM | POA: Diagnosis not present

## 2014-06-07 DIAGNOSIS — IMO0002 Reserved for concepts with insufficient information to code with codable children: Secondary | ICD-10-CM

## 2014-06-07 DIAGNOSIS — E1065 Type 1 diabetes mellitus with hyperglycemia: Secondary | ICD-10-CM | POA: Diagnosis not present

## 2014-06-07 NOTE — Progress Notes (Signed)
Patient ID: [name], female   DOB: [birth year], 54 y.o.   MRN: [id number]   Reason for Appointment : Follow up for Type 1 Diabetes  History of Present Illness           Diagnosis: Type 2 diabetes mellitus, date of diagnosis: 1990        Past history: She has been on insulin since 2007. Generally has had poor control in requiring large doses of insulin. Blood sugars also have been previously variable making her control difficult. She has had difficulty following instructions for mealtime insulin consistently in the past  Has been on a multiple injection regimen of Lantus twice a day, NovoLog a.c. and NPH at bedtime A1c has been as high as 11.3 previously  INSULIN regimen is described as: Lantus a.m. 34 - 32 in PM. Humulin N 30 hs, NovoLog a.c.12- 14-16  Recent history: She has not been seen in follow-up since 10/2013 Her blood sugars are again poorly controlled and she had difficulty complying with her instructions for day-to-day management of her diabetes Continues to be taking large doses of basal insulin and moderate doses of mealtime insulin along with Victoza 1.2 mg Previously had benefited from  using Victoza and had improved control but not benefiting currently A1c has gone up progressively and now nearly 10%  Glucose patterns and problems identified:  She again is checking her blood sugars mostly in the mornings and only occasionally in the afternoons and evenings despite instructions to do more readings throughout the day  She does not know how to adjust her insulin based on carbohydrate intake on meal size.  She thinks she is adjusting the dose based on pre-meal blood sugars but frequently not checking any readings before lunch or dinner anyway  INSULIN compliance: This is inconsistent with her mealtime insulin and will at times forget her NOVOLOG insulin dose  She does not know why she has occasional readings in the 400-500 range but does admit that she will occasionally  forget her evening basal insulin  Also will occasionally check sugars overnight for fear of hypoglycemia but these are not low  Hypoglycemia not present except blood sugar was low normal today; may have done better with her compliance with insulin yesterday in anticipation of today's visit  Fasting readings are mostly high although fluctuating significantly and averaging 170  Blood sugars are CONSISTENTLY high after supper except for once  Has only a few readings in the afternoons which are inconsistent generally higher midday and afternoon  She has not been exercising as before, previously walking or treadmill  Glucose monitoring:  done  1-2 times a day         Glucometer:  Accu-Chek [employer]       Blood Glucose readings from meter download:    PRE-MEAL Breakfast Lunch Dinner Bedtime Overall  Glucose range:  66-343   117-265   117   112-346    Mean/median:  171      197    POST-MEAL PC Breakfast PC Lunch PC Dinner  Glucose range:   111-554    Mean/median:      Physical activity: exercise: Walking  Very little, previouslytreadmill 10-15 min 3/7 days          Self-care: The diet that the patient has been following is none: Lunch : Sometimes salad otherwise sandwiches  Meals: 3 meals per day.  breakfast is at lunch 12 pm Supper at about 6-7 PM, mealtimes are variable  Dietician visit: Most recent:.Several years ago Last CD consult in 10/2013            Wt Readings from Last 3 Encounters:  06/07/14 181 lb 6 oz (82.271 kg)  05/26/14 178 lb (80.74 kg)  05/20/14 178 lb 3.2 oz (80.831 kg)   Previous A1c results  Lab Results  Component Value Date   HGBA1C 9.8* 05/24/2014   HGBA1C 9.3* 11/18/2013   HGBA1C 8.5* 08/02/2013   Lab Results  Component Value Date   MICROALBUR 6.1* 11/18/2013   LDLCALC 30 04/18/2009   CREATININE 2.3* 08/02/2013     Lab Results  Component Value Date   MICROALBUR 6.1* 11/18/2013      Medication List       This list is accurate as of:  06/07/14  8:44 PM.  Always use your most recent med list.               acetaminophen 500 MG tablet  Commonly known as:  TYLENOL  Take 500 mg by mouth every 6 (six) hours as needed for moderate pain.     allopurinol 100 MG tablet  Commonly known as:  ZYLOPRIM  Take 150 mg by mouth every morning.     benazepril 20 MG tablet  Commonly known as:  LOTENSIN  Take 10 mg by mouth every morning. Takes 1/2 tablet     carvedilol 40 MG 24 hr capsule  Commonly known as:  COREG CR  Take 1 capsule (40 mg total) by mouth daily.     COLCRYS 0.6 MG tablet  Generic drug:  colchicine  Take 0.6 mg by mouth daily as needed (gout flare).     HUMULIN N KWIKPEN 100 UNIT/ML Kiwkpen  Generic drug:  Insulin NPH (Human) (Isophane)  INJECT 34 UNITS SUBCUTANEOUSLY AT BEDTIME.     insulin aspart 100 UNIT/ML FlexPen  Commonly known as:  NOVOLOG FLEXPEN  INJECT SUBCUTANEOUSLY 12 UNITS BEFORE BREAKFAST; 14 UNITS AT LUNCH; AND 16 UNITS AT DINNER.     Insulin Glargine 100 UNIT/ML Solostar Pen  Commonly known as:  LANTUS SOLOSTAR  INJECT 40 UNITS SUBCUTANEOUSLY IN THE MORNING AND 30 UNITS AT BEDTIME.     LANOXIN 0.125 MG tablet  Generic drug:  digoxin  TAKE 1 TABLET BY MOUTH DAILY EXCEPT ON SATURDAY AND SUNDAY.     pantoprazole 40 MG tablet  Commonly known as:  PROTONIX  TAKE 1 TABLET BY MOUTH 30 MINS PRIOR TO YOUR FIRST MEAL.     spironolactone 25 MG tablet  Commonly known as:  ALDACTONE  Take 25 mg by mouth every morning.     VICTOZA 18 MG/3ML Sopn  Generic drug:  Liraglutide  INJECT 1.2MG  SUBCUTANEOUSLY DAILY AS DIRECTED.     warfarin 5 MG tablet  Commonly known as:  COUMADIN  Take 1/2 tablet daily except 1 tablet on Mondays and Thursdays        Allergies:  Allergies  Allergen Reactions  . Wheat Swelling  . Latex Rash  . Penicillins Rash  . Sulfa Antibiotics Rash    Past Medical History  Diagnosis Date  . Congenital third degree heart block     a. Guidant VVI pacemaker implanted  in 09/1997; b. gen change in 01/2004;  c. 03/2013 upgrade to [place], ser# [id number].  Marland Kitchen Chronic atrial fibrillation     embolic rind  . Nonischemic cardiomyopathy     a. 03/2013 Echo: Sev LV dysfxn with sev diff HK, restrictive phys, diast dysfxn, mild  MR, sev dil LA/RA, mod PR, PASP 66mmHg;  b. 03/2013: BSX Energen CRTD BiV ICC, ser# [id number].  Marland Kitchen Chronic anticoagulation     a. coumadin.  Marland Kitchen Helicobacter pylori gastritis 08/14/2011  . Dysphagia 08/14/2011    FEB 2013 EGD/DIL 16 MM; GERD; h/o gastroesophageal reflux disease; + H. Pylori gastritis   . Dizziness and giddiness 05/19/2012  . Automatic implantable cardioverter-defibrillator in situ     a. 03/2013: BSX Energen CRTD BiV ICC, ser# [id number]  . Chronic kidney disease     creatinine- 1.8 in 09/2006 and 2.0 in 05/2007; 2.05 in 2011, 2.29 in 2012  . Kidney stones 1990's  . Chronic systolic CHF (congestive heart failure)     a. 03/2013 Echo: Sev LV dysfxn with sev diff HK, restrictive phys, diast dysfxn, mild MR, sev dil LA/RA, mod PR, PASP 52mmHg.  Marland Kitchen IDDM (insulin dependent diabetes mellitus)   . GERD (gastroesophageal reflux disease)   . Stroke 1999    denies residual on 04/21/2013    Past Surgical History  Procedure Laterality Date  . Colonoscopy  04/23/2011    [id number] COLON POLYP/ INTERNAL HEMORRHOIDS/ TORTUOUS COLON  . Upper gastrointestinal endoscopy  FEB 2013    RING/DIL TO 16 MM, H PYLORI GASTRITIS  . Bi-ventricular implantable cardioverter defibrillator  (crt-d)  04/21/2013  . Pacemaker generator change  01/2004  . Insert / replace / remove pacemaker  09/1997    Guidant VVI [employer]  . Bi-ventricular implantable cardioverter defibrillator upgrade N/A 04/21/2013    Procedure: BI-VENTRICULAR IMPLANTABLE CARDIOVERTER DEFIBRILLATOR UPGRADE;  Surgeon: Evans Lance, MD;  Location: [facility] CATH LAB;  Service: Cardiovascular;  Laterality: N/A;    Family History  Problem Relation Age of Onset  . Adopted: Yes  . Colon  cancer Neg Hx     Social History:  reports that she has never smoked. She has never used smokeless tobacco. She reports that she does not drink alcohol or use illicit drugs.    Review of Systems:   She has had stable chronic renal insufficiency, followed by nephrologist. Etiology unclear. No history of microalbuminuria   Lab Results  Component Value Date   CREATININE 2.3* 08/02/2013   BUN 67* 08/02/2013   NA 138 08/02/2013   K 4.3 08/02/2013   CL 102 08/02/2013   CO2 28 08/02/2013     She has had cardiomyopathy and atrial fibrillation followed by cardiologist  Lipids: Has dyslipidemia with low HDL, high triglycerides  Lab Results  Component Value Date   CHOL 172 11/18/2013   HDL 24.10* 11/18/2013   LDLCALC 30 04/18/2009   LDLDIRECT 82.8 11/18/2013   TRIG 325.0* 11/18/2013   CHOLHDL 7 11/18/2013   Foot exam in 6/15 normal except absent pedal pulses on the right  Physical Examination:  BP 128/72 mmHg  Pulse 77  Temp(Src) 97.7 F (36.5 C) (Oral)  Ht 5\' 6"  (1.676 m)  Wt 181 lb 6 oz (82.271 kg)  BMI 29.29 kg/m2  SpO2 97%      She has  no  edema   ASSESSMENT:  Diabetes type 1:  See history of present illness for detailed discussion of her current blood sugar patterns, treatment regimen, meal planning and problems identified  Her blood sugars are consistently poor and now worsening This is likely to be from poor compliance with her insulin regimen both basal and bolus although she is reluctant to admit this.  She has readings as high as 554 at times from noncompliance She has been irregular with  follow-up also  Although she had previously benefited from [place] she is not getting good control with this and using 1.2 mg Also not motivated to exercise recently She has had diabetes education previously but not able to follow instructions to modify her meals and be compliant with her insulin Also not checking blood sugars at various times as directed  PLAN:     Discussed needing to check blood sugars more consistently throughout the day and not every morning  Discussed options for using insulin pump as this would simplify her insulin regimen, provide more consistent insulin delivery and help her adjust her mealtime doses based on carbohydrate intake.  She has several reservations about this and discussed benefits and how this works.  She will also talk to the nurse educator in more detail today and if acceptable will get her C-peptide checked   For now will not change her insulin regimen as she does not have any consistent abnormal glucose patterns except after supper    she does need to take at least 2-4 units more at suppertime for her mealtime coverage  To restart exercise  Counseling time over 50% of today's 25 minute visit  Patient Instructions  Please check blood sugars before meals and some more about 2 hours after any meal and 4-3 times per week on waking up.   Please bring blood sugar monitor to each visit.  Recommended blood sugar levels about 2 hours after meal is 140-180 and on waking up 90-130  Start walking     Lenord Fralix 06/07/2014, 8:44 PM

## 2014-06-07 NOTE — Patient Instructions (Addendum)
Please check blood sugars before meals and some more about 2 hours after any meal and 4-3 times per week on waking up.   Please bring blood sugar monitor to each visit.  Recommended blood sugar levels about 2 hours after meal is 140-180 and on waking up 90-130  Start walking

## 2014-06-07 NOTE — Progress Notes (Signed)
Pre visit review using our clinic review tool, if applicable. No additional management support is needed unless otherwise documented below in the visit note. 

## 2014-06-22 ENCOUNTER — Ambulatory Visit (INDEPENDENT_AMBULATORY_CARE_PROVIDER_SITE_OTHER): Payer: Medicaid Other | Admitting: *Deleted

## 2014-06-22 ENCOUNTER — Other Ambulatory Visit: Payer: Self-pay | Admitting: Endocrinology

## 2014-06-22 ENCOUNTER — Telehealth: Payer: Self-pay | Admitting: Endocrinology

## 2014-06-22 DIAGNOSIS — I4891 Unspecified atrial fibrillation: Secondary | ICD-10-CM | POA: Diagnosis not present

## 2014-06-22 DIAGNOSIS — Z5181 Encounter for therapeutic drug level monitoring: Secondary | ICD-10-CM

## 2014-06-22 LAB — POCT INR: INR: 2.2

## 2014-06-22 NOTE — Telephone Encounter (Signed)
RX SENT

## 2014-06-22 NOTE — Telephone Encounter (Signed)
Pt called need refill on lantus called into [facility] @ [phone] she is completley out

## 2014-07-14 ENCOUNTER — Other Ambulatory Visit: Payer: Self-pay | Admitting: Endocrinology

## 2014-07-29 ENCOUNTER — Encounter: Payer: Self-pay | Admitting: Internal Medicine

## 2014-07-29 ENCOUNTER — Other Ambulatory Visit: Payer: Self-pay | Admitting: *Deleted

## 2014-07-29 ENCOUNTER — Telehealth: Payer: Self-pay | Admitting: Endocrinology

## 2014-07-29 MED ORDER — LIRAGLUTIDE 18 MG/3ML ~~LOC~~ SOPN: PEN_INJECTOR | SUBCUTANEOUS | Status: DC

## 2014-07-29 NOTE — Telephone Encounter (Signed)
Patient need a refill of her victoza.

## 2014-08-03 ENCOUNTER — Ambulatory Visit (INDEPENDENT_AMBULATORY_CARE_PROVIDER_SITE_OTHER): Payer: Medicaid Other | Admitting: *Deleted

## 2014-08-03 DIAGNOSIS — Z5181 Encounter for therapeutic drug level monitoring: Secondary | ICD-10-CM | POA: Diagnosis not present

## 2014-08-03 DIAGNOSIS — I4891 Unspecified atrial fibrillation: Secondary | ICD-10-CM | POA: Diagnosis not present

## 2014-08-03 LAB — POCT INR: INR: 2.1

## 2014-08-08 ENCOUNTER — Emergency Department (HOSPITAL_COMMUNITY): Payer: Medicaid Other

## 2014-08-08 ENCOUNTER — Emergency Department (HOSPITAL_COMMUNITY)
Admission: EM | Admit: 2014-08-08 | Discharge: 2014-08-08 | Disposition: A | Discharge status: Home / Self Care | Payer: Medicaid Other | Attending: Emergency Medicine | Admitting: Emergency Medicine

## 2014-08-08 ENCOUNTER — Encounter (HOSPITAL_COMMUNITY): Payer: Self-pay | Admitting: Emergency Medicine

## 2014-08-08 DIAGNOSIS — Z8673 Personal history of transient ischemic attack (TIA), and cerebral infarction without residual deficits: Secondary | ICD-10-CM | POA: Diagnosis not present

## 2014-08-08 DIAGNOSIS — Z8774 Personal history of (corrected) congenital malformations of heart and circulatory system: Secondary | ICD-10-CM | POA: Insufficient documentation

## 2014-08-08 DIAGNOSIS — Z91040 Latex allergy status: Secondary | ICD-10-CM | POA: Diagnosis not present

## 2014-08-08 DIAGNOSIS — Z87442 Personal history of urinary calculi: Secondary | ICD-10-CM | POA: Diagnosis not present

## 2014-08-08 DIAGNOSIS — Z880 Allergy status to penicillin: Secondary | ICD-10-CM | POA: Insufficient documentation

## 2014-08-08 DIAGNOSIS — Z7901 Long term (current) use of anticoagulants: Secondary | ICD-10-CM | POA: Insufficient documentation

## 2014-08-08 DIAGNOSIS — E119 Type 2 diabetes mellitus without complications: Secondary | ICD-10-CM | POA: Diagnosis not present

## 2014-08-08 DIAGNOSIS — N189 Chronic kidney disease, unspecified: Secondary | ICD-10-CM | POA: Insufficient documentation

## 2014-08-08 DIAGNOSIS — I482 Chronic atrial fibrillation: Secondary | ICD-10-CM | POA: Insufficient documentation

## 2014-08-08 DIAGNOSIS — Z8619 Personal history of other infectious and parasitic diseases: Secondary | ICD-10-CM | POA: Diagnosis not present

## 2014-08-08 DIAGNOSIS — Z79899 Other long term (current) drug therapy: Secondary | ICD-10-CM | POA: Insufficient documentation

## 2014-08-08 DIAGNOSIS — Z794 Long term (current) use of insulin: Secondary | ICD-10-CM | POA: Insufficient documentation

## 2014-08-08 DIAGNOSIS — I5022 Chronic systolic (congestive) heart failure: Secondary | ICD-10-CM | POA: Diagnosis not present

## 2014-08-08 DIAGNOSIS — K219 Gastro-esophageal reflux disease without esophagitis: Secondary | ICD-10-CM | POA: Diagnosis not present

## 2014-08-08 DIAGNOSIS — M79672 Pain in left foot: Secondary | ICD-10-CM | POA: Insufficient documentation

## 2014-08-08 DIAGNOSIS — Z950 Presence of cardiac pacemaker: Secondary | ICD-10-CM | POA: Diagnosis not present

## 2014-08-08 DIAGNOSIS — M7989 Other specified soft tissue disorders: Secondary | ICD-10-CM | POA: Diagnosis not present

## 2014-08-08 MED ORDER — OXYCODONE-ACETAMINOPHEN 5-325 MG PO TABS
1.0000 | ORAL_TABLET | Freq: Once | ORAL | Status: AC
  Administered 2014-08-08: 1 via ORAL
  Filled 2014-08-08: qty 1

## 2014-08-08 MED ORDER — OXYCODONE-ACETAMINOPHEN 5-325 MG PO TABS: 1.0000 | ORAL_TABLET | Freq: Four times a day (QID) | ORAL | Status: DC | PRN

## 2014-08-08 NOTE — ED Notes (Signed)
Pain to left ankle and foot for 4-5 days. Pt states she had worn new shoes and then foot began to hurt, but unsure if that has anything to do with it. States she has taken tylenol and iced foot with minimal relief. Pt is a heart pt.

## 2014-08-08 NOTE — Discharge Instructions (Signed)
You were seen today for left foot pain. You could have bruised her foot from wearing ill fitting shoes. Other considerations include arthritis or gout. This is less likely is your pain does not appear to be in the joint. Your x-rays are negative for fracture. You should follow-up with her primary doctor if he develops redness, warmth, increasing swelling, or any new or worsening symptoms.

## 2014-08-08 NOTE — ED Notes (Signed)
Discharge instructions given, pt demonstrated teach back and verbal understanding. No concerns voiced.  

## 2014-08-08 NOTE — ED Provider Notes (Signed)
CSN: [id number]     Arrival date & time 08/08/14  0443 History   First MD Initiated Contact with Patient 08/08/14 [phone]     Chief Complaint  Patient presents with  . Foot Pain     (Consider location/radiation/quality/duration/timing/severity/associated sxs/prior Treatment) HPI  This is a 54 year old female with a history of atrial fibrillation, cardiomyopathy, systolic heart failure, diabetes who presents with left foot pain. Patient reports pain to the left foot and ankle for the last week. She states that she wore new shoes last Sunday and developed pain following that. The pain is over the dorsal aspect of the foot laterally. She states that it is sharp and gets worse with walking. She denies any injury. She is use Tylenol and ice with minimal relief. Currently her pain is 10 out of 10. Patient reports a history of gout but states that this feels different. Denies any fevers or overlying skin changes.  Past Medical History  Diagnosis Date  . Congenital third degree heart block     a. Guidant VVI pacemaker implanted in 09/1997; b. gen change in 01/2004;  c. 03/2013 upgrade to [place], ser# [id number].  Marland Kitchen Chronic atrial fibrillation     embolic rind  . Nonischemic cardiomyopathy     a. 03/2013 Echo: Sev LV dysfxn with sev diff HK, restrictive phys, diast dysfxn, mild MR, sev dil LA/RA, mod PR, PASP 55mmHg;  b. 03/2013: BSX Energen CRTD BiV ICC, ser# [id number].  Marland Kitchen Chronic anticoagulation     a. coumadin.  Marland Kitchen Helicobacter pylori gastritis 08/14/2011  . Dysphagia 08/14/2011    FEB 2013 EGD/DIL 16 MM; GERD; h/o gastroesophageal reflux disease; + H. Pylori gastritis   . Dizziness and giddiness 05/19/2012  . Automatic implantable cardioverter-defibrillator in situ     a. 03/2013: BSX Energen CRTD BiV ICC, ser# [id number]  . Chronic kidney disease     creatinine- 1.8 in 09/2006 and 2.0 in 05/2007; 2.05 in 2011, 2.29 in 2012  . Kidney stones 1990's  . Chronic systolic CHF (congestive heart  failure)     a. 03/2013 Echo: Sev LV dysfxn with sev diff HK, restrictive phys, diast dysfxn, mild MR, sev dil LA/RA, mod PR, PASP 35mmHg.  Marland Kitchen IDDM (insulin dependent diabetes mellitus)   . GERD (gastroesophageal reflux disease)   . Stroke 1999    denies residual on 04/21/2013   Past Surgical History  Procedure Laterality Date  . Colonoscopy  04/23/2011    [id number] COLON POLYP/ INTERNAL HEMORRHOIDS/ TORTUOUS COLON  . Upper gastrointestinal endoscopy  FEB 2013    RING/DIL TO 16 MM, H PYLORI GASTRITIS  . Bi-ventricular implantable cardioverter defibrillator  (crt-d)  04/21/2013  . Pacemaker generator change  01/2004  . Insert / replace / remove pacemaker  09/1997    Guidant VVI [employer]  . Bi-ventricular implantable cardioverter defibrillator upgrade N/A 04/21/2013    Procedure: BI-VENTRICULAR IMPLANTABLE CARDIOVERTER DEFIBRILLATOR UPGRADE;  Surgeon: Evans Lance, MD;  Location: [facility] CATH LAB;  Service: Cardiovascular;  Laterality: N/A;   Family History  Problem Relation Age of Onset  . Adopted: Yes  . Colon cancer Neg Hx    History  Substance Use Topics  . Smoking status: Never Smoker   . Smokeless tobacco: Never Used  . Alcohol Use: No   OB History    No data available     Review of Systems  Constitutional: Negative for fever.  Respiratory: Negative for chest tightness and shortness of breath.   Cardiovascular: Negative  for chest pain.  Gastrointestinal: Negative for abdominal pain.  Musculoskeletal: Negative for back pain and joint swelling.       Left foot pain  Skin: Negative for color change.  Psychiatric/Behavioral: Negative for confusion.  All other systems reviewed and are negative.     Allergies  Wheat; Latex; Penicillins; and Sulfa antibiotics  Home Medications   Prior to Admission medications   Medication Sig Start Date End Date Taking? Authorizing Provider  acetaminophen (TYLENOL) 500 MG tablet Take 500 mg by mouth every 6 (six) hours as  needed for moderate pain.    Historical Provider, MD  allopurinol (ZYLOPRIM) 100 MG tablet Take 150 mg by mouth every morning.     Historical Provider, MD  benazepril (LOTENSIN) 20 MG tablet Take 10 mg by mouth every morning. Takes 1/2 tablet    Historical Provider, MD  carvedilol (COREG CR) 40 MG 24 hr capsule Take 1 capsule (40 mg total) by mouth daily. 04/19/14   Herminio Commons, MD  COLCRYS 0.6 MG tablet Take 0.6 mg by mouth daily as needed (gout flare).  06/27/11   Historical Provider, MD  HUMULIN N KWIKPEN 100 UNIT/ML Kiwkpen INJECT Anton Ruiz AT BEDTIME. 05/30/14   Elayne Snare, MD  insulin aspart (NOVOLOG FLEXPEN) 100 UNIT/ML FlexPen INJECT SUBCUTANEOUSLY 12 UNITS BEFORE BREAKFAST; 14 UNITS AT LUNCH; AND 16 UNITS AT DINNER. 05/02/14   Elayne Snare, MD  LANOXIN 125 MCG tablet TAKE 1 TABLET BY MOUTH DAILY EXCEPT ON SATURDAY AND SUNDAY. 05/20/14   Evans Lance, MD  LANTUS SOLOSTAR 100 UNIT/ML Solostar Pen INJECT 40 UNITS SUBCUTANEOUSLY IN THE MORNING AND 30 UNITS AT BEDTIME. 06/22/14   Elayne Snare, MD  Liraglutide (VICTOZA) 18 MG/3ML SOPN INJECT 1.2MG  SUBCUTANEOUSLY DAILY AS DIRECTED. 07/29/14   Elayne Snare, MD  oxyCODONE-acetaminophen (PERCOCET/ROXICET) 5-325 MG per tablet Take 1 tablet by mouth every 6 (six) hours as needed for severe pain. 08/08/14   Merryl Hacker, MD  pantoprazole (PROTONIX) 40 MG tablet TAKE 1 TABLET BY MOUTH 30 MINS PRIOR TO YOUR FIRST MEAL. 03/14/14   Orvil Feil, NP  spironolactone (ALDACTONE) 25 MG tablet Take 25 mg by mouth every morning.     Historical Provider, MD  warfarin (COUMADIN) 5 MG tablet Take 1/2 tablet daily except 1 tablet on Mondays and Thursdays 10/21/13   Herminio Commons, MD   BP 111/67 mmHg  Pulse 60  Temp(Src) 97.6 F (36.4 C) (Oral)  Resp 20  Ht 5\' 3"  (1.6 m)  Wt 179 lb (81.194 kg)  BMI 31.72 kg/m2  SpO2 100% Physical Exam  Constitutional: She is oriented to person, place, and time. She appears well-developed and well-nourished. No  distress.  HENT:  Head: Normocephalic and atraumatic.  Cardiovascular: Normal rate and regular rhythm.   Pulmonary/Chest: Effort normal. No respiratory distress.  Musculoskeletal:  Focused examination of the left foot reveals tenderness to palpation over the lateral dorsum of the foot extending into the plantar region, mild swelling noted, no swelling noted of the ankle, range of motion of the ankle intact, 2+ DP pulses, no midfoot tenderness, no  Obvious deformity or abrasion  Neurological: She is alert and oriented to person, place, and time.  Skin: Skin is warm and dry.  Psychiatric: She has a normal mood and affect.  Nursing note and vitals reviewed.   ED Course  Procedures (including critical care time) Labs Review Labs Reviewed - No data to display  Imaging Review Dg Foot Complete Left  08/08/2014  CLINICAL DATA:  Left foot and ankle pain for 8 days. Feet began to hurt after wearing new shoes. No specific injury.  EXAM: LEFT FOOT - COMPLETE 3+ VIEW  COMPARISON:  03/08/2008  FINDINGS: There is no evidence of fracture or dislocation. There is no evidence of arthropathy or other focal bone abnormality. Soft tissues are unremarkable.  IMPRESSION: Negative.   Electronically Signed   By: Lucienne Capers M.D.   On: 08/08/2014 05:42     EKG Interpretation None      MDM   Final diagnoses:  Left foot pain    Patient presents with left foot pain. Nontoxic on exam. Physical exam is largely unremarkable with the exception of point tenderness and minimal swelling over the lateral dorsum of the foot. Does not appear to be within the joint. Low suspicion for gout at this time. No overlying skin changes suggestive of cellulitis. She will reports wearing appear new ill fitting shoes which could've changed her gait and resulted in some musculoskeletal pain of the foot. Plain films are negative for x-ray. Given patient's renal function, she will not tolerate anti-inflammatory. Patient will be  discharged with a short course of pain medication. She is to follow-up with her doctor in 1-2 days if her symptoms are not improving.  After history, exam, and medical workup I feel the patient has been appropriately medically screened and is safe for discharge home. Pertinent diagnoses were discussed with the patient. Patient was given return precautions.    Merryl Hacker, MD 08/08/14 [phone]

## 2014-08-18 ENCOUNTER — Telehealth: Payer: Self-pay

## 2014-08-18 ENCOUNTER — Other Ambulatory Visit: Payer: Self-pay | Admitting: Endocrinology

## 2014-08-18 ENCOUNTER — Other Ambulatory Visit: Payer: Self-pay | Admitting: Cardiovascular Disease

## 2014-08-18 NOTE — Telephone Encounter (Signed)
[facility] [facility] requested refill for lasix 80 mg twice a day.   We spoke with patient and she states she takes 80 mg DAILY but Dr Lattie Haw had written rx for Twice a Day so she would have "extra"   [place] gave her 60 tablets today as "emergency refill " today.  May I refill and give as directed vs twice a day and still give her # 4 ?

## 2014-08-18 NOTE — Telephone Encounter (Signed)
Bernita Raisin, RN (You)   Herminio Commons, MD  48 minutes ago   (11:31 AM)    Herminio Commons, MD   Bernita Raisin, RN (You)  1 hour ago   (11:10 AM)    That would be fine.  Bernita Raisin, RN (You)   Herminio Commons, MD  1 hour ago   (11:03 AM)    [facility] [facility] requested refill for lasix 80 mg twice a day.   We spoke with patient and she states she takes 80 mg DAILY but Dr Lattie Haw had written rx for Twice a Day so she would have "extra"   [place] gave her 60 tablets today as "emergency refill " today.  May I refill and give as directed vs twice a day and still give her # 59 ?  Staci T Ashworth, CMA   [facility] [place]  2 hours ago   (9:54 AM)    Surescripts Out Interface   [facility] [place] Refill  2 hours ago   (9:37 AM)    [facility] [phone]   Surescripts Out Interface

## 2014-08-18 NOTE — Telephone Encounter (Signed)
That would be fine 

## 2014-08-22 ENCOUNTER — Encounter: Payer: Self-pay | Admitting: Internal Medicine

## 2014-08-22 ENCOUNTER — Ambulatory Visit (INDEPENDENT_AMBULATORY_CARE_PROVIDER_SITE_OTHER): Payer: Medicaid Other | Admitting: *Deleted

## 2014-08-22 DIAGNOSIS — I428 Other cardiomyopathies: Secondary | ICD-10-CM

## 2014-08-22 DIAGNOSIS — I429 Cardiomyopathy, unspecified: Secondary | ICD-10-CM | POA: Diagnosis not present

## 2014-08-22 NOTE — Progress Notes (Signed)
Remote ICD transmission.   

## 2014-08-25 ENCOUNTER — Ambulatory Visit: Payer: BLUE CROSS/BLUE SHIELD | Admitting: Orthopedic Surgery

## 2014-08-25 LAB — CUP PACEART REMOTE DEVICE CHECK
Battery Remaining Longevity: 96 mo
Battery Remaining Percentage: 100 %
Brady Statistic RA Percent Paced: 0 %
HIGH POWER IMPEDANCE MEASURED VALUE: 77 Ohm
Lead Channel Impedance Value: 475 Ohm
Lead Channel Setting Pacing Amplitude: 2.4 V
Lead Channel Setting Sensing Sensitivity: 1 mV
MDC IDC MSMT LEADCHNL LV IMPEDANCE VALUE: 469 Ohm
MDC IDC SESS DTM: 20160627060500
MDC IDC SET LEADCHNL LV PACING AMPLITUDE: 2.4 V
MDC IDC SET LEADCHNL LV PACING PULSEWIDTH: 0.5 ms
MDC IDC SET LEADCHNL RV PACING PULSEWIDTH: 0.5 ms
MDC IDC SET LEADCHNL RV SENSING SENSITIVITY: 0.6 mV
MDC IDC SET ZONE DETECTION INTERVAL: 300 ms
MDC IDC SET ZONE DETECTION INTERVAL: 353 ms
MDC IDC STAT BRADY RV PERCENT PACED: 100 %
Pulse Gen Serial Number: 114945

## 2014-08-29 ENCOUNTER — Other Ambulatory Visit: Payer: Self-pay | Admitting: Cardiovascular Disease

## 2014-09-14 ENCOUNTER — Ambulatory Visit (INDEPENDENT_AMBULATORY_CARE_PROVIDER_SITE_OTHER): Payer: Medicaid Other | Admitting: *Deleted

## 2014-09-14 DIAGNOSIS — Z5181 Encounter for therapeutic drug level monitoring: Secondary | ICD-10-CM | POA: Diagnosis not present

## 2014-09-14 DIAGNOSIS — I4891 Unspecified atrial fibrillation: Secondary | ICD-10-CM

## 2014-09-14 LAB — POCT INR: INR: 2.3

## 2014-09-14 MED ORDER — WARFARIN SODIUM 5 MG PO TABS: ORAL_TABLET | ORAL | Status: DC

## 2014-09-21 ENCOUNTER — Other Ambulatory Visit: Payer: Self-pay | Admitting: Gastroenterology

## 2014-09-21 ENCOUNTER — Encounter: Payer: Self-pay | Admitting: *Deleted

## 2014-10-12 ENCOUNTER — Other Ambulatory Visit: Payer: Medicaid Other

## 2014-10-14 ENCOUNTER — Other Ambulatory Visit: Payer: Self-pay | Admitting: *Deleted

## 2014-10-14 ENCOUNTER — Other Ambulatory Visit (INDEPENDENT_AMBULATORY_CARE_PROVIDER_SITE_OTHER): Payer: Medicaid Other

## 2014-10-14 DIAGNOSIS — E1121 Type 2 diabetes mellitus with diabetic nephropathy: Secondary | ICD-10-CM

## 2014-10-14 LAB — URINALYSIS, ROUTINE W REFLEX MICROSCOPIC
Bilirubin Urine: NEGATIVE
Ketones, ur: NEGATIVE
Nitrite: NEGATIVE
PH: 5.5 (ref 5.0–8.0)
Specific Gravity, Urine: 1.01 (ref 1.000–1.030)
TOTAL PROTEIN, URINE-UPE24: NEGATIVE
URINE GLUCOSE: NEGATIVE
UROBILINOGEN UA: 0.2 (ref 0.0–1.0)

## 2014-10-14 LAB — LIPID PANEL
CHOLESTEROL: 140 mg/dL (ref 0–200)
HDL: 23.6 mg/dL — ABNORMAL LOW (ref >39.00)
LDL CALC: 81 mg/dL (ref 0–99)
NONHDL: 116.34
Total CHOL/HDL Ratio: 6
Triglycerides: 176 mg/dL — ABNORMAL HIGH (ref 0.0–149.0)
VLDL: 35.2 mg/dL (ref 0.0–40.0)

## 2014-10-14 LAB — MICROALBUMIN / CREATININE URINE RATIO
Creatinine,U: 122.3 mg/dL
MICROALB UR: 1.9 mg/dL (ref 0.0–1.9)
MICROALB/CREAT RATIO: 1.6 mg/g (ref 0.0–30.0)

## 2014-10-19 ENCOUNTER — Ambulatory Visit: Payer: Medicaid Other | Admitting: Endocrinology

## 2014-10-19 ENCOUNTER — Ambulatory Visit (INDEPENDENT_AMBULATORY_CARE_PROVIDER_SITE_OTHER): Payer: Medicaid Other | Admitting: *Deleted

## 2014-10-19 ENCOUNTER — Other Ambulatory Visit: Payer: Self-pay | Admitting: Endocrinology

## 2014-10-19 DIAGNOSIS — I4891 Unspecified atrial fibrillation: Secondary | ICD-10-CM

## 2014-10-19 DIAGNOSIS — Z5181 Encounter for therapeutic drug level monitoring: Secondary | ICD-10-CM

## 2014-10-19 LAB — POCT INR: INR: 2.6

## 2014-10-20 ENCOUNTER — Encounter: Payer: Self-pay | Admitting: Endocrinology

## 2014-10-20 ENCOUNTER — Ambulatory Visit (INDEPENDENT_AMBULATORY_CARE_PROVIDER_SITE_OTHER): Payer: Medicaid Other | Admitting: Endocrinology

## 2014-10-20 VITALS — BP 140/82 | HR 63 | Temp 97.8°F | Resp 16 | Ht 66.0 in | Wt 180.6 lb

## 2014-10-20 DIAGNOSIS — E1065 Type 1 diabetes mellitus with hyperglycemia: Secondary | ICD-10-CM | POA: Diagnosis not present

## 2014-10-20 DIAGNOSIS — N189 Chronic kidney disease, unspecified: Secondary | ICD-10-CM | POA: Diagnosis not present

## 2014-10-20 DIAGNOSIS — IMO0002 Reserved for concepts with insufficient information to code with codable children: Secondary | ICD-10-CM

## 2014-10-20 DIAGNOSIS — E1122 Type 2 diabetes mellitus with diabetic chronic kidney disease: Secondary | ICD-10-CM

## 2014-10-20 LAB — POCT GLYCOSYLATED HEMOGLOBIN (HGB A1C): Hemoglobin A1C: 7.4

## 2014-10-20 NOTE — Progress Notes (Signed)
Patient ID: [name], female   DOB: 1960/12/11, 54 y.o.   MRN: [id number]   Reason for Appointment : Follow up for Type 1 Diabetes  History of Present Illness           Diagnosis: Type 2 diabetes mellitus, date of diagnosis: 1990        Past history: She has been on insulin since 2007. Generally has had poor control in requiring large doses of insulin. Blood sugars also have been previously variable making her control difficult. She has had difficulty following instructions for mealtime insulin consistently in the past  Has been on a multiple injection regimen of Lantus twice a day, NovoLog a.c. and NPH at bedtime A1c has been as high as 11.3 previously  INSULIN regimen is described as: Lantus a.m. 34 - 26 in PM. Humulin N 30 hs, NovoLog a.c.12- 14-14  Recent history: She has not been seen in follow-up since 4/16 Her blood sugars are now dramatically better and she thinks this is from her radically changing her diet at home.  Previously her A1c had been over 9% fairly consistently.  Glucose patterns , current management and problems identified:  She has checked her blood sugars mostly in the morning although has some readings in the evenings , probably before supper   She said that she has eliminated all high-fat foods and fried foods and also is not eating out much. With this she thinks her blood sugars had come down and she cut back on her Lantus by 4 units at night    Recently her fasting readings have been fairly consistently near normal and has had 2 readings in the 60s either at midnight or 8 AM with some symptoms   she has sporadic high readings in the afternoon and evening but usually most of her readings later in the day are fairly good    She is not checking readings after meals very much including after supper.   Although she has not lost weight she is trying to exercise regularly  INSULIN compliance:  She has been more compliant with taking her  Novolog  Glucose monitoring:  done  1-2 times a day         Glucometer:  Accu-Chek [employer]iva       Blood Glucose readings from meter download:   Mean values apply above for all meters except median for One Touch  PRE-MEAL Fasting  2 PM  6-9 PM Bedtime Overall  Glucose range:  66- 127  127-215  63- 188  67, 234   Mean/median:  97   132   119   Physical activity: exercise: Walking 10-15 min 3/7 days          Self-care: The diet that the patient has been following is low fat,  Lunch : Sometimes salad otherwise sandwiches  Meals: 3 meals per day.  breakfast is at lunch 12 pm Supper at about 6-7 PM, mealtimes are variable          Dietician visit: Most recent:.Several years ago Last CD consult in 10/2013            Wt Readings from Last 3 Encounters:  10/20/14 180 lb 9.6 oz (81.92 kg)  08/08/14 179 lb (81.194 kg)  06/07/14 181 lb 6 oz (82.271 kg)   Previous A1c results  Lab Results  Component Value Date   HGBA1C 7.4 10/20/2014   HGBA1C 9.8* 05/24/2014   HGBA1C 9.3* 11/18/2013   Lab Results  Component Value Date   MICROALBUR 1.9 10/14/2014   LDLCALC 81 10/14/2014   CREATININE 2.3* 08/02/2013     Lab Results  Component Value Date   MICROALBUR 1.9 10/14/2014      Medication List       This list is accurate as of: 10/20/14 11:59 PM.  Always use your most recent med list.               acetaminophen 500 MG tablet  Commonly known as:  TYLENOL  Take 500 mg by mouth every 6 (six) hours as needed for moderate pain.     allopurinol 100 MG tablet  Commonly known as:  ZYLOPRIM  Take 150 mg by mouth every morning.     benazepril 20 MG tablet  Commonly known as:  LOTENSIN  Take 10 mg by mouth every morning. Takes 1/2 tablet     carvedilol 40 MG 24 hr capsule  Commonly known as:  COREG CR  Take 1 capsule (40 mg total) by mouth daily.     COLCRYS 0.6 MG tablet  Generic drug:  colchicine  Take 0.6 mg by mouth daily as needed (gout flare).     furosemide 80 MG tablet   Commonly known as:  LASIX  TAKE (1) TABLET BY MOUTH TWICE DAILY.     HUMULIN N KWIKPEN 100 UNIT/ML Kiwkpen  Generic drug:  Insulin NPH (Human) (Isophane)  INJECT 34 UNITS SUBCUTANEOUSLY AT BEDTIME.     insulin aspart 100 UNIT/ML FlexPen  Commonly known as:  NOVOLOG FLEXPEN  INJECT SUBCUTANEOUSLY 12 UNITS BEFORE BREAKFAST; 14 UNITS AT LUNCH; AND 16 UNITS AT DINNER.     LANOXIN 0.125 MG tablet  Generic drug:  digoxin  TAKE 1 TABLET BY MOUTH DAILY EXCEPT ON SATURDAY AND SUNDAY.     LANTUS SOLOSTAR 100 UNIT/ML Solostar Pen  Generic drug:  Insulin Glargine  INJECT 40 UNITS SUBCUTANEOUSLY IN THE MORNING AND 30 UNITS AT BEDTIME.     Liraglutide 18 MG/3ML Sopn  Commonly known as:  VICTOZA  INJECT 1.2MG  SUBCUTANEOUSLY DAILY AS DIRECTED.     oxyCODONE-acetaminophen 5-325 MG per tablet  Commonly known as:  PERCOCET/ROXICET  Take 1 tablet by mouth every 6 (six) hours as needed for severe pain.     pantoprazole 40 MG tablet  Commonly known as:  PROTONIX  TAKE 1 TABLET BY MOUTH 30 MINS PRIOR TO YOUR FIRST MEAL.     spironolactone 25 MG tablet  Commonly known as:  ALDACTONE  Take 25 mg by mouth every morning.     warfarin 5 MG tablet  Commonly known as:  COUMADIN  Take 1/2 tablet daily except 1 tablet on Thursdays        Allergies:  Allergies  Allergen Reactions  . Wheat Swelling  . Latex Rash  . Penicillins Rash  . Sulfa Antibiotics Rash    Past Medical History  Diagnosis Date  . Congenital third degree heart block     a. Guidant VVI pacemaker implanted in 09/1997; b. gen change in 01/2004;  c. 03/2013 upgrade to [place], ser# [id number].  Marland Kitchen Chronic atrial fibrillation     embolic rind  . Nonischemic cardiomyopathy     a. 03/2013 Echo: Sev LV dysfxn with sev diff HK, restrictive phys, diast dysfxn, mild MR, sev dil LA/RA, mod PR, PASP 54mmHg;  b. 03/2013: BSX Energen CRTD BiV ICC, ser# [id number].  Marland Kitchen Chronic anticoagulation     a. coumadin.  Marland Kitchen Helicobacter pylori  gastritis  08/14/2011  . Dysphagia 08/14/2011    FEB 2013 EGD/DIL 16 MM; GERD; h/o gastroesophageal reflux disease; + H. Pylori gastritis   . Dizziness and giddiness 05/19/2012  . Automatic implantable cardioverter-defibrillator in situ     a. 03/2013: BSX Energen CRTD BiV ICC, ser# [id number]  . Chronic kidney disease     creatinine- 1.8 in 09/2006 and 2.0 in 05/2007; 2.05 in 2011, 2.29 in 2012  . Kidney stones 1990's  . Chronic systolic CHF (congestive heart failure)     a. 03/2013 Echo: Sev LV dysfxn with sev diff HK, restrictive phys, diast dysfxn, mild MR, sev dil LA/RA, mod PR, PASP 42mmHg.  Marland Kitchen IDDM (insulin dependent diabetes mellitus)   . GERD (gastroesophageal reflux disease)   . Stroke 1999    denies residual on 04/21/2013    Past Surgical History  Procedure Laterality Date  . Colonoscopy  04/23/2011    [id number] COLON POLYP/ INTERNAL HEMORRHOIDS/ TORTUOUS COLON  . Upper gastrointestinal endoscopy  FEB 2013    RING/DIL TO 16 MM, H PYLORI GASTRITIS  . Bi-ventricular implantable cardioverter defibrillator  (crt-d)  04/21/2013  . Pacemaker generator change  01/2004  . Insert / replace / remove pacemaker  09/1997    Guidant VVI [employer]  . Bi-ventricular implantable cardioverter defibrillator upgrade N/A 04/21/2013    Procedure: BI-VENTRICULAR IMPLANTABLE CARDIOVERTER DEFIBRILLATOR UPGRADE;  Surgeon: Evans Lance, MD;  Location: [facility] CATH LAB;  Service: Cardiovascular;  Laterality: N/A;    Family History  Problem Relation Age of Onset  . Adopted: Yes  . Colon cancer Neg Hx     Social History:  reports that she has never smoked. She has never used smokeless tobacco. She reports that she does not drink alcohol or use illicit drugs.    Review of Systems:   She has had stable chronic renal insufficiency, followed by nephrologist. Etiology unclear. No history of microalbuminuria   Lab Results  Component Value Date   CREATININE 2.3* 08/02/2013   BUN 67* 08/02/2013   NA  138 08/02/2013   K 4.3 08/02/2013   CL 102 08/02/2013   CO2 28 08/02/2013     She has had cardiomyopathy and atrial fibrillation followed by cardiologist  Lipids: Has dyslipidemia with low HDL, high triglycerides  Lab Results  Component Value Date   CHOL 140 10/14/2014   HDL 23.60* 10/14/2014   LDLCALC 81 10/14/2014   LDLDIRECT 82.8 11/18/2013   TRIG 176.0* 10/14/2014   CHOLHDL 6 10/14/2014   Foot exam in 6/15 normal except absent pedal pulses on the right  Physical Examination:  BP 140/82 mmHg  Pulse 63  Temp(Src) 97.8 F (36.6 C)  Resp 16  Ht 5\' 6"  (1.676 m)  Wt 180 lb 9.6 oz (81.92 kg)  BMI 29.16 kg/m2  SpO2 98%      She has  no  edema   ASSESSMENT:  Diabetes type 1:  See history of present illness for detailed discussion of her current blood sugar patterns, treatment regimen, meal planning and problems identified  Her blood sugars are consistently well controlled in the last few weeks with her markedly improving her diet and reducing caloric intake and fat intake significantly  She is probably also more compliant with her insulin regimen  Recently exercising although not losing weight   More recently her fasting readings are relatively low   Her nonfasting readings are fairly good when she checks them but does not do enough postprandial readings  A1c is the best in quite  some time at 7.4  PLAN:    Reduce NPH at night and also morning Lantus as below.  Discussed blood sugar targets and how to adjust bedtime NPH based on morning blood sugar trend  She can adjust her mealtime doses based on her carbohydrate intake and meal size and try to take Novolog before eating consistently  Consider using Toujeo history of Lantus once a day, discussed the differences between this and Lantus  More nonfasting readings, about 2 hours after eating especially after breakfast and lunch  Continue watching diet and a regular exercise regimen  Patient Instructions  Cut  down N insulin at night to 24 units  Lantus in am 30 instead of 34  Check coverage of Toujeo instead of Lantus  Check blood sugars on waking up .Marland Kitchen3-4  .Marland Kitchen times a week Also check blood sugars about 2 hours after a meal and do this after different meals by rotation Recommended blood sugar levels on waking up is 90-130 and about 2 hours after meal is 140-180 Please bring blood sugar monitor to each visit.   Counseling time on subjects discussed above is over 50% of today's 25 minute visit    Javana Schey 10/21/2014, 12:52 PM

## 2014-10-20 NOTE — Patient Instructions (Addendum)
Cut down N insulin at night to 24 units  Lantus in am 30 instead of 34  Check coverage of Toujeo instead of Lantus  Check blood sugars on waking up .Marland Kitchen3-4  .Marland Kitchen times a week Also check blood sugars about 2 hours after a meal and do this after different meals by rotation Recommended blood sugar levels on waking up is 90-130 and about 2 hours after meal is 140-180 Please bring blood sugar monitor to each visit.

## 2014-10-21 ENCOUNTER — Other Ambulatory Visit: Payer: Self-pay | Admitting: Endocrinology

## 2014-11-20 ENCOUNTER — Other Ambulatory Visit: Payer: Self-pay | Admitting: Endocrinology

## 2014-11-21 ENCOUNTER — Ambulatory Visit (INDEPENDENT_AMBULATORY_CARE_PROVIDER_SITE_OTHER): Payer: Medicaid Other | Admitting: *Deleted

## 2014-11-21 DIAGNOSIS — I429 Cardiomyopathy, unspecified: Secondary | ICD-10-CM | POA: Diagnosis not present

## 2014-11-21 DIAGNOSIS — I428 Other cardiomyopathies: Secondary | ICD-10-CM

## 2014-11-22 NOTE — Progress Notes (Signed)
Remote ICD transmission.   

## 2014-11-30 ENCOUNTER — Ambulatory Visit (INDEPENDENT_AMBULATORY_CARE_PROVIDER_SITE_OTHER): Payer: Medicaid Other | Admitting: *Deleted

## 2014-11-30 DIAGNOSIS — Z5181 Encounter for therapeutic drug level monitoring: Secondary | ICD-10-CM | POA: Diagnosis not present

## 2014-11-30 DIAGNOSIS — I4891 Unspecified atrial fibrillation: Secondary | ICD-10-CM

## 2014-11-30 LAB — CUP PACEART REMOTE DEVICE CHECK
Battery Remaining Longevity: 96 mo
Battery Remaining Percentage: 100 %
Brady Statistic RA Percent Paced: 0 %
Brady Statistic RV Percent Paced: 100 %
Date Time Interrogation Session: 20160926055100
HIGH POWER IMPEDANCE MEASURED VALUE: 71 Ohm
Lead Channel Impedance Value: 434 Ohm
Lead Channel Pacing Threshold Amplitude: 0.7 V
Lead Channel Pacing Threshold Pulse Width: 0.5 ms
Lead Channel Setting Pacing Amplitude: 2.4 V
Lead Channel Setting Pacing Pulse Width: 0.5 ms
Lead Channel Setting Sensing Sensitivity: 0.6 mV
MDC IDC MSMT LEADCHNL RV IMPEDANCE VALUE: 474 Ohm
MDC IDC MSMT LEADCHNL RV PACING THRESHOLD AMPLITUDE: 0.5 V
MDC IDC MSMT LEADCHNL RV PACING THRESHOLD PULSEWIDTH: 0.5 ms
MDC IDC PG SERIAL: 114945
MDC IDC SET LEADCHNL LV PACING AMPLITUDE: 2.4 V
MDC IDC SET LEADCHNL LV SENSING SENSITIVITY: 1 mV
MDC IDC SET LEADCHNL RV PACING PULSEWIDTH: 0.5 ms
MDC IDC SET ZONE DETECTION INTERVAL: 300 ms
Zone Setting Detection Interval: 353 ms

## 2014-11-30 LAB — POCT INR: INR: 1.9

## 2014-12-01 ENCOUNTER — Other Ambulatory Visit: Payer: Self-pay | Admitting: Endocrinology

## 2014-12-01 ENCOUNTER — Other Ambulatory Visit: Payer: Self-pay | Admitting: Internal Medicine

## 2014-12-07 ENCOUNTER — Encounter: Payer: Self-pay | Admitting: Cardiology

## 2014-12-08 ENCOUNTER — Telehealth: Payer: Self-pay | Admitting: Cardiovascular Disease

## 2014-12-08 NOTE — Telephone Encounter (Signed)
Does pt need antibiotic ?

## 2014-12-08 NOTE — Telephone Encounter (Signed)
Patient states she is having teeth cleaned Tuesday and need antibiotic called in to [facility]. / tg

## 2014-12-09 NOTE — Telephone Encounter (Signed)
LM on pt's vm that atb is not necessary

## 2014-12-09 NOTE — Telephone Encounter (Signed)
Not required

## 2014-12-12 ENCOUNTER — Ambulatory Visit (INDEPENDENT_AMBULATORY_CARE_PROVIDER_SITE_OTHER): Payer: Medicaid Other | Admitting: *Deleted

## 2014-12-12 DIAGNOSIS — Z5181 Encounter for therapeutic drug level monitoring: Secondary | ICD-10-CM | POA: Diagnosis not present

## 2014-12-12 DIAGNOSIS — I4891 Unspecified atrial fibrillation: Secondary | ICD-10-CM

## 2014-12-12 LAB — POCT INR: INR: 2.5

## 2014-12-19 ENCOUNTER — Encounter: Payer: Self-pay | Admitting: Internal Medicine

## 2014-12-21 ENCOUNTER — Encounter: Payer: Self-pay | Admitting: Cardiology

## 2014-12-22 LAB — HM DIABETES EYE EXAM

## 2015-01-09 ENCOUNTER — Encounter: Payer: Self-pay | Admitting: *Deleted

## 2015-01-18 ENCOUNTER — Ambulatory Visit (INDEPENDENT_AMBULATORY_CARE_PROVIDER_SITE_OTHER): Payer: Medicaid Other | Admitting: *Deleted

## 2015-01-18 DIAGNOSIS — Z5181 Encounter for therapeutic drug level monitoring: Secondary | ICD-10-CM

## 2015-01-18 DIAGNOSIS — I4891 Unspecified atrial fibrillation: Secondary | ICD-10-CM | POA: Diagnosis not present

## 2015-01-18 LAB — POCT INR: INR: 1.6

## 2015-01-23 ENCOUNTER — Other Ambulatory Visit (INDEPENDENT_AMBULATORY_CARE_PROVIDER_SITE_OTHER): Payer: Medicaid Other

## 2015-01-23 ENCOUNTER — Encounter: Payer: Self-pay | Admitting: Endocrinology

## 2015-01-23 ENCOUNTER — Ambulatory Visit (INDEPENDENT_AMBULATORY_CARE_PROVIDER_SITE_OTHER): Payer: Medicaid Other | Admitting: Endocrinology

## 2015-01-23 VITALS — BP 124/68 | HR 66 | Temp 98.4°F | Resp 16 | Ht 66.0 in | Wt 174.8 lb

## 2015-01-23 DIAGNOSIS — E1122 Type 2 diabetes mellitus with diabetic chronic kidney disease: Secondary | ICD-10-CM | POA: Diagnosis not present

## 2015-01-23 DIAGNOSIS — E1165 Type 2 diabetes mellitus with hyperglycemia: Secondary | ICD-10-CM

## 2015-01-23 DIAGNOSIS — E1121 Type 2 diabetes mellitus with diabetic nephropathy: Secondary | ICD-10-CM | POA: Diagnosis not present

## 2015-01-23 DIAGNOSIS — Z794 Long term (current) use of insulin: Secondary | ICD-10-CM | POA: Diagnosis not present

## 2015-01-23 DIAGNOSIS — IMO0002 Reserved for concepts with insufficient information to code with codable children: Secondary | ICD-10-CM

## 2015-01-23 DIAGNOSIS — N184 Chronic kidney disease, stage 4 (severe): Secondary | ICD-10-CM

## 2015-01-23 LAB — BASIC METABOLIC PANEL
BUN: 67 mg/dL — ABNORMAL HIGH (ref 6–23)
CHLORIDE: 101 meq/L (ref 96–112)
CO2: 24 mEq/L (ref 19–32)
Calcium: 9.2 mg/dL (ref 8.4–10.5)
Creatinine, Ser: 2.49 mg/dL — ABNORMAL HIGH (ref 0.40–1.20)
GFR: 25.77 mL/min — AB (ref >60.00)
Glucose, Bld: 134 mg/dL — ABNORMAL HIGH (ref 70–99)
POTASSIUM: 4.1 meq/L (ref 3.5–5.1)
SODIUM: 138 meq/L (ref 135–145)

## 2015-01-23 LAB — LIPID PANEL
CHOL/HDL RATIO: 6
Cholesterol: 153 mg/dL (ref 0–200)
HDL: 24.9 mg/dL — ABNORMAL LOW (ref >39.00)
LDL Cholesterol: 93 mg/dL (ref 0–99)
NONHDL: 127.97
TRIGLYCERIDES: 176 mg/dL — AB (ref 0.0–149.0)
VLDL: 35.2 mg/dL (ref 0.0–40.0)

## 2015-01-23 LAB — POCT GLYCOSYLATED HEMOGLOBIN (HGB A1C): HEMOGLOBIN A1C: 7.2

## 2015-01-23 NOTE — Patient Instructions (Signed)
Check blood sugars on waking up 3-4  times a week Also check blood sugars about 2 hours after a meal and do this after different meals by rotation  Recommended blood sugar levels on waking up is 90-130 and about 2 hours after meal is 130-160  Please bring your blood sugar monitor to each visit, thank you  Walk daily

## 2015-01-23 NOTE — Progress Notes (Signed)
Patient ID: [name], female   DOB: 09/25/1960, 54 y.o.   MRN: [id number]   Reason for Appointment : Follow up for Type 1 Diabetes  History of Present Illness           Diagnosis: Type 2 diabetes mellitus, date of diagnosis: 1990        Past history: She has been on insulin since 2007. Generally has had poor control in requiring large doses of insulin. Blood sugars also have been previously variable making her control difficult. She has had difficulty following instructions for mealtime insulin consistently in the past  Has been on a multiple injection regimen of Lantus twice a day, NovoLog a.c. and NPH at bedtime A1c has been as high as 11.3 previously  INSULIN regimen is described as: Lantus a.m. 30 - 26 in PM. Humulin N 24 hs, NovoLog a.c.12- 14-14 before meals  Recent history: Her blood sugars are now better control since she has change her diet earlier this year   Previously her A1c had been over 9% fairly consistently.   This has now been much better and today 7.2  She is continuing her basal bolus insulin regimen as well as Victoza  Glucose patterns , current management and problems identified:  She has checked her blood sugars mostly in the morning again and has minimal blood sugars later in the day despite reminders to check more readings on the last visit   Her insulin was reduced significantly on her last visit especially NPH and also Lantus. She thinks she is following instructions given previously although has some difficulty remembering her doses   she has had one episode of hypoglycemia overnight and she does not know why   FASTING blood sugars are overall control but has had more fluctuation recently because of stress     Recently she has lost weight she is trying to exercise regularly  INSULIN compliance:  She has been compliant with taking her Novolog but is adjusting the dose only occasionally and she thinks she is adjusting the morning dose based  on her pre-meal blood sugar   she has a few nonfasting readings and these appear to be relatively high , she is not able to remember why  Glucose monitoring:  done  1-2 times a day         Glucometer:  Accu-Chek Aviva       Blood Glucose readings from meter download:   Mean values apply above for all meters except median for One Touch  PRE-MEAL Fasting Lunch Dinner Bedtime Overall  Glucose range: 77-214  ?  206  157  182   Mean/median: 115     121    Physical activity: exercise: Walking 10-15 min 3/7 days          Self-care: The diet that the patient has been following is low fat,  Lunch : Sometimes salad otherwise sandwiches  Meals: 3 meals per day.  breakfast is at lunch 12 pm Supper at about 6-7 PM, mealtimes are variable          Dietician visit: Most recent:.Several years ago Last CD consult in 10/2013            Wt Readings from Last 3 Encounters:  01/23/15 174 lb 12.8 oz (79.289 kg)  10/20/14 180 lb 9.6 oz (81.92 kg)  08/08/14 179 lb (81.194 kg)   Previous A1c results  Lab Results  Component Value Date   HGBA1C 7.2 01/23/2015   HGBA1C  7.4 10/20/2014   HGBA1C 9.8* 05/24/2014   Lab Results  Component Value Date   MICROALBUR 1.9 10/14/2014   LDLCALC 81 10/14/2014   CREATININE 2.3* 08/02/2013     Lab Results  Component Value Date   MICROALBUR 1.9 10/14/2014      Medication List       This list is accurate as of: 01/23/15  5:06 PM.  Always use your most recent med list.               acetaminophen 500 MG tablet  Commonly known as:  TYLENOL  Take 500 mg by mouth every 6 (six) hours as needed for moderate pain.     allopurinol 100 MG tablet  Commonly known as:  ZYLOPRIM  Take 150 mg by mouth every morning.     benazepril 20 MG tablet  Commonly known as:  LOTENSIN  Take 10 mg by mouth every morning. Takes 1/2 tablet     carvedilol 40 MG 24 hr capsule  Commonly known as:  COREG CR  Take 1 capsule (40 mg total) by mouth daily.     COLCRYS 0.6 MG  tablet  Generic drug:  colchicine  Take 0.6 mg by mouth daily as needed (gout flare).     furosemide 80 MG tablet  Commonly known as:  LASIX  TAKE (1) TABLET BY MOUTH TWICE DAILY.     HUMULIN N KWIKPEN 100 UNIT/ML Kiwkpen  Generic drug:  Insulin NPH (Human) (Isophane)  INJECT 34 UNITS SUBCUTANEOUSLY AT BEDTIME.     LANOXIN 0.125 MG tablet  Generic drug:  digoxin  TAKE 1 TABLET BY MOUTH DAILY EXCEPT ON SATURDAY AND SUNDAY.     LANTUS SOLOSTAR 100 UNIT/ML Solostar Pen  Generic drug:  Insulin Glargine  INJECT 40 UNITS SUBCUTANEOUSLY IN THE MORNING AND 30 UNITS AT BEDTIME.     NOVOLOG FLEXPEN 100 UNIT/ML FlexPen  Generic drug:  insulin aspart  INJECT SUBCUTANEOUSLY 12 UNITS BEFORE BREAKFAST; 14 UNITS AT LUNCH; AND 16 UNITS AT DINNER.     oxyCODONE-acetaminophen 5-325 MG tablet  Commonly known as:  PERCOCET/ROXICET  Take 1 tablet by mouth every 6 (six) hours as needed for severe pain.     pantoprazole 40 MG tablet  Commonly known as:  PROTONIX  TAKE 1 TABLET BY MOUTH 30 MINS PRIOR TO YOUR FIRST MEAL.     spironolactone 25 MG tablet  Commonly known as:  ALDACTONE  Take 25 mg by mouth every morning.     traMADol-acetaminophen 37.5-325 MG tablet  Commonly known as:  ULTRACET  Take 1 tablet by mouth every 6 (six) hours as needed.     VICTOZA 18 MG/3ML Sopn  Generic drug:  Liraglutide  INJECT 1.2MG  SUBCUTANEOUSLY DAILY AS DIRECTED.     warfarin 5 MG tablet  Commonly known as:  COUMADIN  Take 1/2 tablet daily except 1 tablet on Thursdays        Allergies:  Allergies  Allergen Reactions  . Wheat Swelling  . Latex Rash  . Penicillins Rash  . Sulfa Antibiotics Rash    Past Medical History  Diagnosis Date  . Congenital third degree heart block     a. Guidant VVI pacemaker implanted in 09/1997; b. gen change in 01/2004;  c. 03/2013 upgrade to [place], ser# [id number].  Marland Kitchen Chronic atrial fibrillation (HCC)     embolic rind  . Nonischemic cardiomyopathy ([place])      a. 03/2013 Echo: Sev LV dysfxn with sev diff HK,  restrictive phys, diast dysfxn, mild MR, sev dil LA/RA, mod PR, PASP 64mmHg;  b. 03/2013: BSX Energen CRTD BiV ICC, ser# [id number].  Marland Kitchen Chronic anticoagulation     a. coumadin.  Marland Kitchen Helicobacter pylori gastritis 08/14/2011  . Dysphagia 08/14/2011    FEB 2013 EGD/DIL 16 MM; GERD; h/o gastroesophageal reflux disease; + H. Pylori gastritis   . Dizziness and giddiness 05/19/2012  . Automatic implantable cardioverter-defibrillator in situ     a. 03/2013: BSX Energen CRTD BiV ICC, ser# [id number]  . Chronic kidney disease     creatinine- 1.8 in 09/2006 and 2.0 in 05/2007; 2.05 in 2011, 2.29 in 2012  . Kidney stones 1990's  . Chronic systolic CHF (congestive heart failure) ([place])     a. 03/2013 Echo: Sev LV dysfxn with sev diff HK, restrictive phys, diast dysfxn, mild MR, sev dil LA/RA, mod PR, PASP 67mmHg.  Marland Kitchen IDDM (insulin dependent diabetes mellitus) ([place])   . GERD (gastroesophageal reflux disease)   . Stroke [facility]) 1999    denies residual on 04/21/2013    Past Surgical History  Procedure Laterality Date  . Colonoscopy  04/23/2011    [id number] COLON POLYP/ INTERNAL HEMORRHOIDS/ TORTUOUS COLON  . Upper gastrointestinal endoscopy  FEB 2013    RING/DIL TO 16 MM, H PYLORI GASTRITIS  . Bi-ventricular implantable cardioverter defibrillator  (crt-d)  04/21/2013  . Pacemaker generator change  01/2004  . Insert / replace / remove pacemaker  09/1997    Guidant VVI [employer]  . Bi-ventricular implantable cardioverter defibrillator upgrade N/A 04/21/2013    Procedure: BI-VENTRICULAR IMPLANTABLE CARDIOVERTER DEFIBRILLATOR UPGRADE;  Surgeon: Evans Lance, MD;  Location[facility] LAB;  Service: Cardiovascular;  Laterality: N/A;    Family History  Problem Relation Age of Onset  . Adopted: Yes  . Colon cancer Neg Hx     Social History:  reports that she has never smoked. She has never used smokeless tobacco. She reports that she does not drink alcohol or  use illicit drugs.    Review of Systems:   She has had stable chronic renal insufficiency, followed by nephrologist. Etiology unclear. No history of microalbuminuria ;she had labs done last week and will be seeing her nephrologist tomorrow, reports not available   Lab Results  Component Value Date   CREATININE 2.3* 08/02/2013   BUN 67* 08/02/2013   NA 138 08/02/2013   K 4.3 08/02/2013   CL 102 08/02/2013   CO2 28 08/02/2013    She has had cardiomyopathy and atrial fibrillation followed by cardiologist  Lipids: Has dyslipidemia with low HDL, high triglycerides  Lab Results  Component Value Date   CHOL 140 10/14/2014   HDL 23.60* 10/14/2014   LDLCALC 81 10/14/2014   LDLDIRECT 82.8 11/18/2013   TRIG 176.0* 10/14/2014   CHOLHDL 6 10/14/2014   Foot exam in 6/15 normal except absent pedal pulses on the right  Physical Examination:  BP 124/68 mmHg  Pulse 66  Temp(Src) 98.4 F (36.9 C)  Resp 16  Ht 5\' 6"  (1.676 m)  Wt 174 lb 12.8 oz (79.289 kg)  BMI 28.23 kg/m2  SpO2 98%       Diabetic foot exam shows normal monofilament sensation in the toes and plantar surfaces, no skin lesions or ulcers on the feet and normal pedal pulses on the left   ASSESSMENT:  Diabetes type 1:  See history of present illness for detailed discussion of her current blood sugar patterns, treatment regimen, meal planning and problems identified  Her  blood sugars are consistently well controlled and her A1c is again under 7.5 with her being consistent with diet   As discussed above she is requiring less insulin but difficult to assess her mealtime requirements and she is not monitoring blood sugars after meals except rarely.  She also has lost weight.  More recently because of her stress level she has had variable readings in the morning also but otherwise fasting readings are usually good Has only one overnight hypoglycemia episode , probably related to her taking NPH at bedtime  A1c is the best  in quite some time at 7.2  No evidence of neuropathy in her exam today, does have decreased pulses on the rightwithout symptoms  PLAN:     No change in insulin regimen   She may need adjusting her mealtime dose especially at suppertime based on her carbohydrate intake and meal size   discussed blood sugar targets at various times  She will call if she has anymore nocturnal hypoglycemia  More nonfasting readings, about 2 hours after eating especially after breakfast and lunch  Continue watching diet and a regular exercise regimen   Reminded her to take Novolog consistently before every meal   Increase walking daily   To call if she has any hypoglycemia more frequently than occasional   She will  Request for labs to be sent from the nephrologist  Patient Instructions  Check blood sugars on waking up 3-4  times a week Also check blood sugars about 2 hours after a meal and do this after different meals by rotation  Recommended blood sugar levels on waking up is 90-130 and about 2 hours after meal is 130-160  Please bring your blood sugar monitor to each visit, thank you  Walk daily       Counseling time on subjects discussed above is over 50% of today's 25 minute visit    Zineb Glade 01/23/2015, 5:06 PM

## 2015-01-26 ENCOUNTER — Other Ambulatory Visit: Payer: Self-pay | Admitting: Cardiovascular Disease

## 2015-01-30 ENCOUNTER — Ambulatory Visit (INDEPENDENT_AMBULATORY_CARE_PROVIDER_SITE_OTHER): Payer: Medicaid Other | Admitting: *Deleted

## 2015-01-30 DIAGNOSIS — I4891 Unspecified atrial fibrillation: Secondary | ICD-10-CM | POA: Diagnosis not present

## 2015-01-30 DIAGNOSIS — Z5181 Encounter for therapeutic drug level monitoring: Secondary | ICD-10-CM

## 2015-01-30 LAB — POCT INR: INR: 1.7

## 2015-02-15 ENCOUNTER — Ambulatory Visit (INDEPENDENT_AMBULATORY_CARE_PROVIDER_SITE_OTHER): Payer: Medicaid Other | Admitting: Pharmacist

## 2015-02-15 DIAGNOSIS — Z5181 Encounter for therapeutic drug level monitoring: Secondary | ICD-10-CM | POA: Diagnosis not present

## 2015-02-15 DIAGNOSIS — I4891 Unspecified atrial fibrillation: Secondary | ICD-10-CM

## 2015-02-15 LAB — POCT INR: INR: 2.4

## 2015-02-21 ENCOUNTER — Ambulatory Visit (INDEPENDENT_AMBULATORY_CARE_PROVIDER_SITE_OTHER): Payer: Medicaid Other | Admitting: *Deleted

## 2015-02-21 DIAGNOSIS — I429 Cardiomyopathy, unspecified: Secondary | ICD-10-CM | POA: Diagnosis not present

## 2015-02-21 DIAGNOSIS — I428 Other cardiomyopathies: Secondary | ICD-10-CM

## 2015-02-21 NOTE — Progress Notes (Signed)
Remote ICD transmission.   

## 2015-02-24 ENCOUNTER — Telehealth: Payer: Self-pay | Admitting: Endocrinology

## 2015-02-24 NOTE — Telephone Encounter (Signed)
Pa was sent yesterday.

## 2015-02-24 NOTE — Telephone Encounter (Signed)
Patient stated that she need a Prior Auth for medication Victoza, she is totally out, please call it in today. [place], [place] [phone] (Phone) [phone] (Fax)

## 2015-03-08 ENCOUNTER — Ambulatory Visit (INDEPENDENT_AMBULATORY_CARE_PROVIDER_SITE_OTHER): Payer: Medicaid Other | Admitting: *Deleted

## 2015-03-08 DIAGNOSIS — Z5181 Encounter for therapeutic drug level monitoring: Secondary | ICD-10-CM | POA: Diagnosis not present

## 2015-03-08 DIAGNOSIS — I4891 Unspecified atrial fibrillation: Secondary | ICD-10-CM | POA: Diagnosis not present

## 2015-03-08 LAB — POCT INR: INR: 2.4

## 2015-03-13 ENCOUNTER — Encounter: Payer: Self-pay | Admitting: Gastroenterology

## 2015-03-13 LAB — CUP PACEART REMOTE DEVICE CHECK
Brady Statistic RA Percent Paced: 0 %
Brady Statistic RV Percent Paced: 100 %
Date Time Interrogation Session: 20161227085900
HIGH POWER IMPEDANCE MEASURED VALUE: 74 Ohm
Implantable Lead Location: 753858
Implantable Lead Location: 753860
Implantable Lead Model: 180
Lead Channel Impedance Value: 493 Ohm
Lead Channel Pacing Threshold Amplitude: 0.5 V
Lead Channel Pacing Threshold Amplitude: 0.7 V
Lead Channel Pacing Threshold Pulse Width: 0.5 ms
Lead Channel Pacing Threshold Pulse Width: 0.5 ms
Lead Channel Setting Pacing Amplitude: 2.4 V
Lead Channel Setting Sensing Sensitivity: 1 mV
MDC IDC LEAD IMPLANT DT: 20150225
MDC IDC LEAD IMPLANT DT: 20150225
MDC IDC LEAD SERIAL: 311545
MDC IDC MSMT BATTERY REMAINING LONGEVITY: 96 mo
MDC IDC MSMT BATTERY REMAINING PERCENTAGE: 100 %
MDC IDC MSMT LEADCHNL LV IMPEDANCE VALUE: 445 Ohm
MDC IDC SET LEADCHNL LV PACING AMPLITUDE: 2.4 V
MDC IDC SET LEADCHNL LV PACING PULSEWIDTH: 0.5 ms
MDC IDC SET LEADCHNL RV PACING PULSEWIDTH: 0.5 ms
MDC IDC SET LEADCHNL RV SENSING SENSITIVITY: 0.6 mV
Pulse Gen Serial Number: 114945

## 2015-03-15 ENCOUNTER — Encounter: Payer: Self-pay | Admitting: Cardiology

## 2015-03-23 ENCOUNTER — Other Ambulatory Visit: Payer: Self-pay | Admitting: Cardiovascular Disease

## 2015-03-27 ENCOUNTER — Encounter: Payer: Self-pay | Admitting: *Deleted

## 2015-03-29 ENCOUNTER — Encounter: Payer: Self-pay | Admitting: Cardiology

## 2015-04-05 ENCOUNTER — Ambulatory Visit (INDEPENDENT_AMBULATORY_CARE_PROVIDER_SITE_OTHER): Payer: Medicaid Other | Admitting: *Deleted

## 2015-04-05 DIAGNOSIS — I4891 Unspecified atrial fibrillation: Secondary | ICD-10-CM

## 2015-04-05 DIAGNOSIS — Z5181 Encounter for therapeutic drug level monitoring: Secondary | ICD-10-CM

## 2015-04-05 LAB — POCT INR: INR: 1.8

## 2015-04-12 ENCOUNTER — Other Ambulatory Visit: Payer: Self-pay | Admitting: Endocrinology

## 2015-04-20 ENCOUNTER — Other Ambulatory Visit: Payer: Medicaid Other

## 2015-04-24 ENCOUNTER — Ambulatory Visit: Payer: Medicaid Other | Admitting: Endocrinology

## 2015-04-24 ENCOUNTER — Other Ambulatory Visit: Payer: Self-pay | Admitting: Endocrinology

## 2015-04-24 ENCOUNTER — Other Ambulatory Visit: Payer: Self-pay | Admitting: Cardiovascular Disease

## 2015-04-26 ENCOUNTER — Ambulatory Visit (INDEPENDENT_AMBULATORY_CARE_PROVIDER_SITE_OTHER): Payer: Medicaid Other | Admitting: *Deleted

## 2015-04-26 DIAGNOSIS — Z5181 Encounter for therapeutic drug level monitoring: Secondary | ICD-10-CM

## 2015-04-26 DIAGNOSIS — I4891 Unspecified atrial fibrillation: Secondary | ICD-10-CM

## 2015-04-26 LAB — POCT INR: INR: 1.9

## 2015-05-04 ENCOUNTER — Ambulatory Visit: Payer: Medicaid Other | Admitting: Cardiovascular Disease

## 2015-05-04 ENCOUNTER — Ambulatory Visit (INDEPENDENT_AMBULATORY_CARE_PROVIDER_SITE_OTHER): Payer: Medicaid Other | Admitting: Cardiovascular Disease

## 2015-05-04 ENCOUNTER — Encounter: Payer: Self-pay | Admitting: Cardiovascular Disease

## 2015-05-04 VITALS — BP 104/70 | HR 82 | Ht 66.0 in | Wt 176.0 lb

## 2015-05-04 DIAGNOSIS — Z95810 Presence of automatic (implantable) cardiac defibrillator: Secondary | ICD-10-CM | POA: Diagnosis not present

## 2015-05-04 DIAGNOSIS — I10 Essential (primary) hypertension: Secondary | ICD-10-CM

## 2015-05-04 DIAGNOSIS — I482 Chronic atrial fibrillation, unspecified: Secondary | ICD-10-CM

## 2015-05-04 DIAGNOSIS — I4891 Unspecified atrial fibrillation: Secondary | ICD-10-CM

## 2015-05-04 DIAGNOSIS — I5022 Chronic systolic (congestive) heart failure: Secondary | ICD-10-CM

## 2015-05-04 NOTE — Progress Notes (Signed)
Patient ID: [name], female   DOB: Aug 26, 1960, 55 y.o.   MRN: [id number]      SUBJECTIVE: The patient is a 55 year old woman with a history of congenital third degree heart block, atrial fibrillation, nonischemic cardiomyopathy s/p BiV ICD (04/21/13) with chronic systolic heart failure, and insulin-dependent diabetes mellitus.  Echocardiogram on 04/19/2014 demonstrated severely reduced left ventricular systolic function, EF 0000000, biventricular dilatation, restrictive physiology, and moderate tricuspid regurgitation.  Device interrogation on 03/03/15 demonstrated normal device function.  Wt 176 lbs (181 on 04/15/14).  She is doing very well and denies chest pain, palpitations, leg swelling , orthopnea, paroxysmal nocturnal dyspnea, and shortness of breath.  She has been making her own soups with chicken and vegetables and feels more energetic.   Review of Systems: As per "subjective", otherwise negative.  Allergies  Allergen Reactions  . Wheat Swelling  . Latex Rash  . Penicillins Rash  . Sulfa Antibiotics Rash    Current Outpatient Prescriptions  Medication Sig Dispense Refill  . acetaminophen (TYLENOL) 500 MG tablet Take 500 mg by mouth every 6 (six) hours as needed for moderate pain.    Marland Kitchen allopurinol (ZYLOPRIM) 100 MG tablet Take 150 mg by mouth every morning.     . benazepril (LOTENSIN) 20 MG tablet Take 10 mg by mouth every morning. Takes 1/2 tablet    . COLCRYS 0.6 MG tablet Take 0.6 mg by mouth daily as needed (gout flare).     . COREG CR 40 MG 24 hr capsule TAKE ONE CAPSULE BY MOUTH DAILY. 30 capsule 6  . COUMADIN 5 MG tablet TAKE (1/2) TABLET BY MOUTH DAILY EXCEPT TAKE 1 TABLET ON THURSDAYS. 30 tablet 1  . furosemide (LASIX) 80 MG tablet TAKE (1) TABLET BY MOUTH TWICE DAILY. 60 tablet 6  . HUMULIN N KWIKPEN 100 UNIT/ML Kiwkpen INJECT 34 UNITS SUBCUTANEOUSLY AT BEDTIME. 15 mL 2  . LANOXIN 125 MCG tablet TAKE 1 TABLET BY MOUTH DAILY EXCEPT ON SATURDAY AND SUNDAY. 20  tablet 11  . LANTUS SOLOSTAR 100 UNIT/ML Solostar Pen INJECT 40 UNITS SUBCUTANEOUSLY IN THE MORNING AND 30 UNITS AT BEDTIME. 15 mL 1  . NOVOLOG FLEXPEN 100 UNIT/ML FlexPen INJECT SUBCUTANEOUSLY 12 UNITS BEFORE BREAKFAST; 14 UNITS AT LUNCH; AND 16 UNITS AT DINNER. 15 mL 3  . oxyCODONE-acetaminophen (PERCOCET/ROXICET) 5-325 MG per tablet Take 1 tablet by mouth every 6 (six) hours as needed for severe pain. 10 tablet 0  . spironolactone (ALDACTONE) 25 MG tablet Take 25 mg by mouth every morning.     . traMADol-acetaminophen (ULTRACET) 37.5-325 MG tablet Take 1 tablet by mouth every 6 (six) hours as needed.    Marland Kitchen VICTOZA 18 MG/3ML SOPN INJECT 1.2MG  SUBCUTANEOUSLY DAILY AS DIRECTED. 6 mL 0   No current facility-administered medications for this visit.    Past Medical History  Diagnosis Date  . Congenital third degree heart block     a. Guidant VVI pacemaker implanted in 09/1997; b. gen change in 01/2004;  c. 03/2013 upgrade to [place], ser# [id number].  Marland Kitchen Chronic atrial fibrillation (HCC)     embolic rind  . Nonischemic cardiomyopathy ([place])     a. 03/2013 Echo: Sev LV dysfxn with sev diff HK, restrictive phys, diast dysfxn, mild MR, sev dil LA/RA, mod PR, PASP 16mmHg;  b. 03/2013: BSX Energen CRTD BiV ICC, ser# [id number].  Marland Kitchen Chronic anticoagulation     a. coumadin.  Marland Kitchen Helicobacter pylori gastritis 08/14/2011  . Dysphagia 08/14/2011    FEB  2013 EGD/DIL 16 MM; GERD; h/o gastroesophageal reflux disease; + H. Pylori gastritis   . Dizziness and giddiness 05/19/2012  . Automatic implantable cardioverter-defibrillator in situ     a. 03/2013: BSX Energen CRTD BiV ICC, ser# [id number]  . Chronic kidney disease     creatinine- 1.8 in 09/2006 and 2.0 in 05/2007; 2.05 in 2011, 2.29 in 2012  . Kidney stones 1990's  . Chronic systolic CHF (congestive heart failure) ([place])     a. 03/2013 Echo: Sev LV dysfxn with sev diff HK, restrictive phys, diast dysfxn, mild MR, sev dil LA/RA, mod PR, PASP 2mmHg.  Marland Kitchen IDDM  (insulin dependent diabetes mellitus) ([place])   . GERD (gastroesophageal reflux disease)   . Stroke [facility]) 1999    denies residual on 04/21/2013    Past Surgical History  Procedure Laterality Date  . Colonoscopy  04/23/2011    [id number] COLON POLYP/ INTERNAL HEMORRHOIDS/ TORTUOUS COLON  . Upper gastrointestinal endoscopy  FEB 2013    RING/DIL TO 16 MM, H PYLORI GASTRITIS  . Bi-ventricular implantable cardioverter defibrillator  (crt-d)  04/21/2013  . Pacemaker generator change  01/2004  . Insert / replace / remove pacemaker  09/1997    Guidant VVI [employer]  . Bi-ventricular implantable cardioverter defibrillator upgrade N/A 04/21/2013    Procedure: BI-VENTRICULAR IMPLANTABLE CARDIOVERTER DEFIBRILLATOR UPGRADE;  Surgeon: Evans Lance, MD;  Location: [facility] C[facility] LAB;  Service: Cardiovascular;  Laterality: N/A;    Social History   Social History  . Marital Status: Married    Spouse Name: N/A  . Number of Children: 4  . Years of Education: N/A   Occupational History  .     Social History Main Topics  . Smoking status: Never Smoker   . Smokeless tobacco: Never Used  . Alcohol Use: No  . Drug Use: No  . Sexual Activity: Yes   Other Topics Concern  . Not on file   Social History Narrative     Filed Vitals:   05/04/15 0921  BP: 104/70  Pulse: 82  Height: 5\' 6"  (1.676 m)  Weight: 176 lb (79.833 kg)  SpO2: 99%    PHYSICAL EXAM General: NAD  Neck: No JVD, no thyromegaly or thyroid nodule.  Lungs: Clear to auscultation bilaterally with normal respiratory effort.  CV: Nondisplaced PMI. Heart regular S1/split S2, no [id number], I/VI pansystolic murmur along left sternal border. No peripheral edema.  Abdomen: Soft, nontender, no distention.  Neurologic: Alert and oriented x 3.  Psych: Normal affect.  Extremities: No clubbing or cyanosis.  Skin: Normal. Musculoskeletal: Normal range of motion, no gross deformities.  ECG: Most recent ECG  reviewed.      ASSESSMENT AND PLAN: 1. Nonischemic cardiomyopathy/chronic systolic heart failure s/p BiV ICD: EF 15-20% on 04/19/14. Currently with NYHA class I symptoms. Continue current diuretic regimen with furosemide and spironolactone. Device interrogation on 03/03/2015 demonstrated normal device function.  2. Essential HTN: Well controlled on present therapy. No changes.  3. Atrial fibrillation: Heart rate is well controlled on carvedilol. She maintains anticoagulation with warfarin and is without bleeding problems.  4. Congenital third-degree heart block with pacemaker: Stable and followed by Dr. Lovena Le.   Dispo: f/u 1 year.   Kate Sable, M.D., F.A.C.C.

## 2015-05-04 NOTE — Patient Instructions (Signed)
Your physician wants you to follow-up in: 1 Year with Dr. Koneswaran.  You will receive a reminder letter in the mail two months in advance. If you don't receive a letter, please call our office to schedule the follow-up appointment.  Your physician recommends that you continue on your current medications as directed. Please refer to the Current Medication list given to you today.  If you need a refill on your cardiac medications before your next appointment, please call your pharmacy.  Thank you for choosing [facility]!   

## 2015-05-05 ENCOUNTER — Other Ambulatory Visit: Payer: Self-pay | Admitting: Endocrinology

## 2015-05-10 ENCOUNTER — Ambulatory Visit (INDEPENDENT_AMBULATORY_CARE_PROVIDER_SITE_OTHER): Payer: Medicaid Other | Admitting: *Deleted

## 2015-05-10 DIAGNOSIS — I4891 Unspecified atrial fibrillation: Secondary | ICD-10-CM | POA: Diagnosis not present

## 2015-05-10 DIAGNOSIS — Z5181 Encounter for therapeutic drug level monitoring: Secondary | ICD-10-CM

## 2015-05-10 LAB — POCT INR: INR: 2.4

## 2015-05-11 ENCOUNTER — Other Ambulatory Visit: Payer: Self-pay | Admitting: Cardiovascular Disease

## 2015-05-11 NOTE — Telephone Encounter (Signed)
Forwarding to [employer] Coumadin pool for authorization.

## 2015-05-18 ENCOUNTER — Encounter (INDEPENDENT_AMBULATORY_CARE_PROVIDER_SITE_OTHER): Payer: Medicaid Other | Admitting: Gastroenterology

## 2015-05-18 NOTE — Progress Notes (Signed)
   Subjective:    Patient ID: [name], female    DOB: Dec 14, 1960, 55 y.o.   MRN: [id number]   NO SHOW/NO CALL  HPI   Past Medical History  Diagnosis Date  . Congenital third degree heart block     a. Guidant VVI pacemaker implanted in 09/1997; b. gen change in 01/2004;  c. 03/2013 upgrade to [place], ser# [id number].  Marland Kitchen Chronic atrial fibrillation (HCC)     embolic rind  . Nonischemic cardiomyopathy ([place])     a. 03/2013 Echo: Sev LV dysfxn with sev diff HK, restrictive phys, diast dysfxn, mild MR, sev dil LA/RA, mod PR, PASP 22mmHg;  b. 03/2013: BSX Energen CRTD BiV ICC, ser# [id number].  Marland Kitchen Chronic anticoagulation     a. coumadin.  Marland Kitchen Helicobacter pylori gastritis 08/14/2011  . Dysphagia 08/14/2011    FEB 2013 EGD/DIL 16 MM; GERD; h/o gastroesophageal reflux disease; + H. Pylori gastritis   . Dizziness and giddiness 05/19/2012  . Automatic implantable cardioverter-defibrillator in situ     a. 03/2013: BSX Energen CRTD BiV ICC, ser# [id number]  . Chronic kidney disease     creatinine- 1.8 in 09/2006 and 2.0 in 05/2007; 2.05 in 2011, 2.29 in 2012  . Kidney stones 1990's  . Chronic systolic CHF (congestive heart failure) ([place])     a. 03/2013 Echo: Sev LV dysfxn with sev diff HK, restrictive phys, diast dysfxn, mild MR, sev dil LA/RA, mod PR, PASP 29mmHg.  Marland Kitchen IDDM (insulin dependent diabetes mellitus) ([place])   . GERD (gastroesophageal reflux disease)   . Stroke [facility]) 1999    denies residual on 04/21/2013    Review of Systems     Objective:   Physical Exam        Assessment & Plan:

## 2015-05-26 ENCOUNTER — Other Ambulatory Visit (INDEPENDENT_AMBULATORY_CARE_PROVIDER_SITE_OTHER): Payer: Medicaid Other

## 2015-05-26 ENCOUNTER — Encounter: Payer: Self-pay | Admitting: Endocrinology

## 2015-05-26 DIAGNOSIS — E1121 Type 2 diabetes mellitus with diabetic nephropathy: Secondary | ICD-10-CM

## 2015-05-26 DIAGNOSIS — E1165 Type 2 diabetes mellitus with hyperglycemia: Secondary | ICD-10-CM

## 2015-05-26 DIAGNOSIS — Z794 Long term (current) use of insulin: Secondary | ICD-10-CM

## 2015-05-26 DIAGNOSIS — IMO0002 Reserved for concepts with insufficient information to code with codable children: Secondary | ICD-10-CM

## 2015-05-26 LAB — LIPID PANEL
CHOL/HDL RATIO: 7
CHOLESTEROL: 151 mg/dL (ref 0–200)
HDL: 22.8 mg/dL — AB (ref >39.00)
NonHDL: 128.39
Triglycerides: 339 mg/dL — ABNORMAL HIGH (ref 0.0–149.0)
VLDL: 67.8 mg/dL — AB (ref 0.0–40.0)

## 2015-05-26 LAB — BASIC METABOLIC PANEL
BUN: 70 mg/dL — AB (ref 6–23)
CALCIUM: 9.4 mg/dL (ref 8.4–10.5)
CO2: 28 mEq/L (ref 19–32)
CREATININE: 2.24 mg/dL — AB (ref 0.40–1.20)
Chloride: 101 mEq/L (ref 96–112)
GFR: 29.09 mL/min — AB (ref >60.00)
GLUCOSE: 98 mg/dL (ref 70–99)
POTASSIUM: 4.4 meq/L (ref 3.5–5.1)
Sodium: 138 mEq/L (ref 135–145)

## 2015-05-26 LAB — LDL CHOLESTEROL, DIRECT: Direct LDL: 81 mg/dL

## 2015-05-29 ENCOUNTER — Ambulatory Visit (INDEPENDENT_AMBULATORY_CARE_PROVIDER_SITE_OTHER): Payer: Medicaid Other | Admitting: *Deleted

## 2015-05-29 ENCOUNTER — Other Ambulatory Visit: Payer: Self-pay | Admitting: Endocrinology

## 2015-05-29 DIAGNOSIS — Z5181 Encounter for therapeutic drug level monitoring: Secondary | ICD-10-CM | POA: Diagnosis not present

## 2015-05-29 DIAGNOSIS — I4891 Unspecified atrial fibrillation: Secondary | ICD-10-CM

## 2015-05-29 LAB — POCT INR: INR: 3

## 2015-05-31 ENCOUNTER — Encounter: Payer: Self-pay | Admitting: Endocrinology

## 2015-05-31 ENCOUNTER — Ambulatory Visit (INDEPENDENT_AMBULATORY_CARE_PROVIDER_SITE_OTHER): Payer: Medicaid Other | Admitting: Endocrinology

## 2015-05-31 VITALS — BP 128/70 | HR 67 | Temp 97.8°F | Resp 14 | Ht 66.0 in | Wt 180.0 lb

## 2015-05-31 DIAGNOSIS — E785 Hyperlipidemia, unspecified: Secondary | ICD-10-CM | POA: Diagnosis not present

## 2015-05-31 DIAGNOSIS — E1129 Type 2 diabetes mellitus with other diabetic kidney complication: Secondary | ICD-10-CM | POA: Diagnosis not present

## 2015-05-31 DIAGNOSIS — Z794 Long term (current) use of insulin: Secondary | ICD-10-CM

## 2015-05-31 LAB — POCT GLYCOSYLATED HEMOGLOBIN (HGB A1C): HEMOGLOBIN A1C: 7.6

## 2015-05-31 NOTE — Progress Notes (Signed)
Patient ID: [name], female   DOB: 1960/06/03, 55 y.o.   MRN: [id number]   Reason for Appointment : Follow up for Type 1 Diabetes  History of Present Illness           Diagnosis: Type 2 diabetes mellitus, date of diagnosis: 1990        Past history: She has been on insulin since 2007. Generally has had poor control in requiring large doses of insulin. Blood sugars also have been previously variable making her control difficult. She has had difficulty following instructions for mealtime insulin consistently in the past  Has been on a multiple injection regimen of Lantus twice a day, NovoLog a.c. and NPH at bedtime A1c has been as high as 11.3 previously  INSULIN regimen is described as: Lantus a.m. 30 - 26 in PM. Humulin N 24 hs, NovoLog a.c.12- 14-16 before meals  Recent history:  She had better control since she has change her diet and 2016 but A1c is starting to go up again and her weight is up 6 pounds  Previously her A1c had been over 9% fairly consistently.   This has now been much better and today 7.2  She is continuing her basal bolus insulin regimen as well as Victoza  Glucose patterns , current management and problems identified:  She has checked her blood sugars mostly in the morning again and these are overall fairly good  Not clear why she has some significantly high readings in the mornings also  She thinks that occasionally she will feel hypoglycemic before suppertime and also occasionally has readings in the 60s after supper.  This is mostly when she is eating smaller amounts of carbohydrate  She probably has not been consistent with exercise until recently and is gaining weight despite taking Victoza  INSULIN compliance:  She has been compliant with taking her Novolog but is not adjusting the dose based on what she is eating and carbohydrate intake  Glucose monitoring:  done  1-2 times a day         Glucometer:  Accu-Chek Aviva       Blood Glucose  readings from meter download:   Mean values apply above for all meters except median for One Touch  PRE-MEAL Fasting Lunch Dinner Bedtime Overall  Glucose range: 82-240      Mean/median: 125     121    Physical activity: exercise: Walking 10-15 min 3/7 days          Self-care: The diet that the patient has been following is low fat,  Lunch : Sometimes salad otherwise sandwiches  Meals: 3 meals per day.  breakfast is at lunch 12 pm Supper at about 6-7 PM, mealtimes are variable          Dietician visit: Most recent:.Several years ago Last CD consult in 10/2013            Wt Readings from Last 3 Encounters:  05/31/15 180 lb (81.647 kg)  05/04/15 176 lb (79.833 kg)  01/23/15 174 lb 12.8 oz (79.289 kg)   Previous A1c results  Lab Results  Component Value Date   HGBA1C 7.6 05/31/2015   HGBA1C 7.2 01/23/2015   HGBA1C 7.4 10/20/2014   Lab Results  Component Value Date   MICROALBUR 1.9 10/14/2014   LDLCALC 93 01/23/2015   CREATININE 2.24* 05/26/2015     Lab Results  Component Value Date   MICROALBUR 1.9 10/14/2014      Medication List  This list is accurate as of: 05/31/15  1:01 PM.  Always use your most recent med list.               acetaminophen 500 MG tablet  Commonly known as:  TYLENOL  Take 500 mg by mouth every 6 (six) hours as needed for moderate pain.     allopurinol 100 MG tablet  Commonly known as:  ZYLOPRIM  Take 150 mg by mouth every morning.     benazepril 20 MG tablet  Commonly known as:  LOTENSIN  Take 10 mg by mouth every morning. Takes 1/2 tablet     COLCRYS 0.6 MG tablet  Generic drug:  colchicine  Take 0.6 mg by mouth daily as needed (gout flare).     COREG CR 40 MG 24 hr capsule  Generic drug:  carvedilol  TAKE ONE CAPSULE BY MOUTH DAILY.     furosemide 80 MG tablet  Commonly known as:  LASIX  TAKE (1) TABLET BY MOUTH TWICE DAILY.     HUMULIN N KWIKPEN 100 UNIT/ML Kiwkpen  Generic drug:  Insulin NPH (Human) (Isophane)    INJECT 34 UNITS SUBCUTANEOUSLY AT BEDTIME.     LANOXIN 0.125 MG tablet  Generic drug:  digoxin  TAKE 1 TABLET BY MOUTH DAILY EXCEPT ON SATURDAY AND SUNDAY.     LANTUS SOLOSTAR 100 UNIT/ML Solostar Pen  Generic drug:  Insulin Glargine  INJECT 40 UNITS SUBCUTANEOUSLY IN THE MORNING AND 30 UNITS AT BEDTIME.     NOVOLOG FLEXPEN 100 UNIT/ML FlexPen  Generic drug:  insulin aspart  INJECT SUBCUTANEOUSLY 12 UNITS BEFORE BREAKFAST; 14 UNITS AT LUNCH; AND 16 UNITS AT DINNER.     oxyCODONE-acetaminophen 5-325 MG tablet  Commonly known as:  PERCOCET/ROXICET  Take 1 tablet by mouth every 6 (six) hours as needed for severe pain.     spironolactone 25 MG tablet  Commonly known as:  ALDACTONE  Take 25 mg by mouth every morning.     traMADol-acetaminophen 37.5-325 MG tablet  Commonly known as:  ULTRACET  Take 1 tablet by mouth every 6 (six) hours as needed.     VICTOZA 18 MG/3ML Sopn  Generic drug:  Liraglutide  INJECT 1.2MG  SUBCUTANEOUSLY DAILY AS DIRECTED.     warfarin 5 MG tablet  Commonly known as:  COUMADIN  Take as directed by Coumadin clinic.        Allergies:  Allergies  Allergen Reactions  . Wheat Swelling  . Latex Rash  . Penicillins Rash  . Sulfa Antibiotics Rash    Past Medical History  Diagnosis Date  . Congenital third degree heart block     a. Guidant VVI pacemaker implanted in 09/1997; b. gen change in 01/2004;  c. 03/2013 upgrade to [place], ser# [id number].  Marland Kitchen Chronic atrial fibrillation (HCC)     embolic rind  . Nonischemic cardiomyopathy ([place])     a. 03/2013 Echo: Sev LV dysfxn with sev diff HK, restrictive phys, diast dysfxn, mild MR, sev dil LA/RA, mod PR, PASP 47mmHg;  b. 03/2013: BSX Energen CRTD BiV ICC, ser# [id number].  Marland Kitchen Chronic anticoagulation     a. coumadin.  Marland Kitchen Helicobacter pylori gastritis 08/14/2011  . Dysphagia 08/14/2011    FEB 2013 EGD/DIL 16 MM; GERD; h/o gastroesophageal reflux disease; + H. Pylori gastritis   . Dizziness and  giddiness 05/19/2012  . Automatic implantable cardioverter-defibrillator in situ     a. 03/2013: BSX Energen CRTD BiV ICC, ser# [id number]  . Chronic kidney  disease     creatinine- 1.8 in 09/2006 and 2.0 in 05/2007; 2.05 in 2011, 2.29 in 2012  . Kidney stones 1990's  . Chronic systolic CHF (congestive heart failure) ([place])     a. 03/2013 Echo: Sev LV dysfxn with sev diff HK, restrictive phys, diast dysfxn, mild MR, sev dil LA/RA, mod PR, PASP 44mmHg.  Marland Kitchen IDDM (insulin dependent diabetes mellitus) ([place])   . GERD (gastroesophageal reflux disease)   . Stroke [facility]) 1999    denies residual on 04/21/2013    Past Surgical History  Procedure Laterality Date  . Colonoscopy  04/23/2011    [id number] COLON POLYP/ INTERNAL HEMORRHOIDS/ TORTUOUS COLON  . Upper gastrointestinal endoscopy  FEB 2013    RING/DIL TO 16 MM, H PYLORI GASTRITIS  . Bi-ventricular implantable cardioverter defibrillator  (crt-d)  04/21/2013  . Pacemaker generator change  01/2004  . Insert / replace / remove pacemaker  09/1997    Guidant VVI [employer]  . Bi-ventricular implantable cardioverter defibrillator upgrade N/A 04/21/2013    Procedure: BI-VENTRICULAR IMPLANTABLE CARDIOVERTER DEFIBRILLATOR UPGRADE;  Surgeon: Evans Lance, MD;  Location: [facility] C[facility] LAB;  Service: Cardiovascular;  Laterality: N/A;    Family History  Problem Relation Age of Onset  . Adopted: Yes  . Colon cancer Neg Hx     Social History:  reports that she has never smoked. She has never used smokeless tobacco. She reports that she does not drink alcohol or use illicit drugs.    Review of Systems:   She has had stable chronic renal insufficiency, followed by nephrologist. Etiology unclear. No history of microalbuminuria ;she had labs done last week and will be seeing her nephrologist tomorrow, reports not available   Lab Results  Component Value Date   CREATININE 2.24* 05/26/2015   BUN 70* 05/26/2015   NA 138 05/26/2015   K 4.4 05/26/2015   CL  101 05/26/2015   CO2 28 05/26/2015    She has had cardiomyopathy and atrial fibrillation followed by cardiologist  Lipids: Has dyslipidemia with low HDL, high triglycerides, not on any treatment  Lab Results  Component Value Date   CHOL 151 05/26/2015   HDL 22.80* 05/26/2015   LDLCALC 93 01/23/2015   LDLDIRECT 81.0 05/26/2015   TRIG 339.0* 05/26/2015   CHOLHDL 7 05/26/2015   Foot exam in 6/15 normal except absent pedal pulses on the right  Physical Examination:  BP 128/70 mmHg  Pulse 67  Temp(Src) 97.8 F (36.6 C)  Resp 14  Ht 5\' 6"  (1.676 m)  Wt 180 lb (81.647 kg)  BMI 29.07 kg/m2  SpO2 97%       Diabetic foot exam shows normal monofilament sensation in the toes and plantar surfaces, no skin lesions or ulcers on the feet and normal pedal pulses on the left   ASSESSMENT:  Diabetes type 1:  See history of present illness for detailed discussion of her current blood sugar patterns, treatment regimen, meal planning and problems identified  Her blood sugars are Not as well controlled with A1c getting higher Also she has gained weight Because of her monitoring blood sugars only fasting and not after meals difficult to get her blood sugar patterns and see where her blood sugars are high She also does not adjust her Novolog based on meal size and carbohydrate intake as discussed on several occasions Although she is trying to watch her diet overall with portion and carbohydrate control she gains weight She does tend to add some hypoglycemia when she is  eating smaller carbohydrate meals and does not adjust her Novolog accordingly  Fasting blood sugars are generally fairly good but sometimes high for unknown reasons  A1c is still over 7%  PLAN:     No change in  Basic insulin regimen   She needs to be adjusting her Novolog at meals especially lunch and supper based on her carbohydrate intake as discussed several times   Also needs to check blood sugars more consistently  in between meals are after supper on the routine basis to help adjust her mealtime doses and also morning Lantus  She will be followed up in 2 months with review of her home blood sugar patterns  Also check fructosamine to assess her control  More consistent exercise  She will try to change her Lantus to the same total dose of Toujeo before her next prescription is ordered  No change in basal insulin as yet  Consider low-dose fenofibrate or gemfibrozil for high triglycerides  Patient Instructions  Check blood sugars on waking up 3-4  times a week Also check blood sugars about 2 hours after a meal and do this after different meals by rotation  Recommended blood sugar levels on waking up is 90-130 and about 2 hours after meal is 130-160  Please bring your blood sugar monitor to each visit, thank you  Adjust Novolog up or down 2-4 units for amount of carbs  Next Rx to replace Lantus is Toujeo       Counseling time on subjects discussed above is over 50% of today's 25 minute visit    [name] 05/31/2015, 1:01 PM

## 2015-05-31 NOTE — Patient Instructions (Signed)
Check blood sugars on waking up 3-4  times a week Also check blood sugars about 2 hours after a meal and do this after different meals by rotation  Recommended blood sugar levels on waking up is 90-130 and about 2 hours after meal is 130-160  Please bring your blood sugar monitor to each visit, thank you  Adjust Novolog up or down 2-4 units for amount of carbs  Next Rx to replace Lantus is Toujeo

## 2015-06-15 ENCOUNTER — Ambulatory Visit (INDEPENDENT_AMBULATORY_CARE_PROVIDER_SITE_OTHER): Payer: Medicaid Other | Admitting: Gastroenterology

## 2015-06-15 ENCOUNTER — Encounter: Payer: Self-pay | Admitting: Gastroenterology

## 2015-06-15 VITALS — BP 110/71 | HR 65 | Temp 97.3°F | Ht 66.0 in | Wt 179.2 lb

## 2015-06-15 DIAGNOSIS — R1013 Epigastric pain: Secondary | ICD-10-CM | POA: Diagnosis not present

## 2015-06-15 DIAGNOSIS — D126 Benign neoplasm of colon, unspecified: Secondary | ICD-10-CM | POA: Diagnosis not present

## 2015-06-15 NOTE — Progress Notes (Signed)
cc'ed to pcp °

## 2015-06-15 NOTE — Patient Instructions (Signed)
Use PEPCID OR ZANTAC AS NEEDED fo rheartburn or indigestion.  Please CALL WITH QUESTIONS OR CONCERNS.  FOLLOW UP IN 1-2 YEARs.

## 2015-06-15 NOTE — Progress Notes (Signed)
On recall  °

## 2015-06-15 NOTE — Assessment & Plan Note (Signed)
SYMPTOMS CONTROLLED/RESOLVED with diet/weight loss. NO NEED FOR PPI.  Use PEPCID OR ZANTAC AS NEEDED fo rheartburn or indigestion. Please CALL WITH QUESTIONS OR CONCERNS. FOLLOW UP IN 1-2 YEARs.

## 2015-06-15 NOTE — Assessment & Plan Note (Signed)
NO WARNING SIGNS/SYMPTOMS.  NEXT TCS IN 5-120 YEARS

## 2015-06-15 NOTE — Progress Notes (Signed)
Subjective:    Patient ID: [name], female    DOB: 31-May-1960, 55 y.o.   MRN: [id number]  FANTA,TESFAYE, MD  HPI Stop taking PPI due to kidney dysfunction. CHANGED DIET AND EATING BETTER SO DOESN'T NEED PPI, ZANTAC, OR PEPCID. LOST WEIGHT DUE TO DIET CHANGES. HGA1C NOW @6 . SINCE GETTING DEFIB DOING GOOD. LOST 2 MO GRANDSON AND STILL DEALING WITH THE LOSS. BMs: ABOUT EVERY DAY.  PT DENIES FEVER, CHILLS, HEMATOCHEZIA, HEMATEMESIS, nausea, vomiting, melena, diarrhea, CHEST PAIN, SHORTNESS OF BREATH,  CHANGE IN BOWEL IN HABITS, constipation, abdominal pain, problems swallowing, OR heartburn or indigestion.   Past Medical History  Diagnosis Date  . Congenital third degree heart block     a. Guidant VVI pacemaker implanted in 09/1997; b. gen change in 01/2004;  c. 03/2013 upgrade to [place], ser# [id number].  Marland Kitchen Chronic atrial fibrillation (HCC)     embolic rind  . Nonischemic cardiomyopathy ([place])     a. 03/2013 Echo: Sev LV dysfxn with sev diff HK, restrictive phys, diast dysfxn, mild MR, sev dil LA/RA, mod PR, PASP 49mmHg;  b. 03/2013: BSX Energen CRTD BiV ICC, ser# [id number].  Marland Kitchen Chronic anticoagulation     a. coumadin.  Marland Kitchen Helicobacter pylori gastritis 08/14/2011  . Dysphagia 08/14/2011    FEB 2013 EGD/DIL 16 MM; GERD; h/o gastroesophageal reflux disease; + H. Pylori gastritis   . Dizziness and giddiness 05/19/2012  . Automatic implantable cardioverter-defibrillator in situ     a. 03/2013: BSX Energen CRTD BiV ICC, ser# [id number]  . Chronic kidney disease     creatinine- 1.8 in 09/2006 and 2.0 in 05/2007; 2.05 in 2011, 2.29 in 2012  . Kidney stones 1990's  . Chronic systolic CHF (congestive heart failure) ([place])     a. 03/2013 Echo: Sev LV dysfxn with sev diff HK, restrictive phys, diast dysfxn, mild MR, sev dil LA/RA, mod PR, PASP 49mmHg.  Marland Kitchen IDDM (insulin dependent diabetes mellitus) ([place])   . GERD (gastroesophageal reflux disease)   . Stroke [facility]) 1999    denies residual on  04/21/2013    Past Surgical History  Procedure Laterality Date  . Colonoscopy  04/23/2011    [id number] COLON POLYP/ INTERNAL HEMORRHOIDS/ TORTUOUS COLON  . Upper gastrointestinal endoscopy  FEB 2013    RING/DIL TO 16 MM, H PYLORI GASTRITIS  . Bi-ventricular implantable cardioverter defibrillator  (crt-d)  04/21/2013  . Pacemaker generator change  01/2004  . Insert / replace / remove pacemaker  09/1997    Guidant VVI [employer]  . Bi-ventricular implantable cardioverter defibrillator upgrade N/A 04/21/2013    Procedure: BI-VENTRICULAR IMPLANTABLE CARDIOVERTER DEFIBRILLATOR UPGRADE;  Surgeon: Evans Lance, MD;  Location: [facility] CATH LAB;  Service: Cardiovascular;  Laterality: N/A;    Allergies  Allergen Reactions  . Wheat Swelling  . Latex Rash  . Penicillins Rash  . Sulfa Antibiotics Rash    Current Outpatient Prescriptions  Medication Sig Dispense Refill  . acetaminophen (TYLENOL) 500 MG tablet Take 500 mg by mouth every 6 (six) hours as needed for moderate pain.    . benazepril (LOTENSIN) 20 MG tablet Take 10 mg by mouth every morning. Takes 1/2 tablet    . COREG CR 40 MG 24 hr capsule TAKE ONE CAPSULE BY MOUTH DAILY.    . furosemide (LASIX) 80 MG tablet TAKE (1) TABLET BY MOUTH TWICE DAILY.    Marland Kitchen HUMULIN N KWIKPEN 100 UNIT/ML Kiwkpen INJECT 34 UNITS SUBCUTANEOUSLY AT BEDTIME.    Marland Kitchen LANOXIN  125 MCG tablet TAKE 1 TABLET BY MOUTH DAILY EXCEPT ON SATURDAY AND SUNDAY.    Marland Kitchen LANTUS SOLOSTAR 100 UNIT/ML Solostar Pen INJECT 40 UNITS SUBCUTANEOUSLY IN THE MORNING AND 30 UNITS AT BEDTIME.    Marland Kitchen NOVOLOG FLEXPEN 100 UNIT/ML FlexPen INJECT SUBCUTANEOUSLY 12 UNITS BEFORE BREAKFAST; 14 UNITS AT LUNCH; AND 16 UNITS AT DINNER.    Marland Kitchen spironolactone (ALDACTONE) 25 MG tablet Take 25 mg by mouth every morning.     Marland Kitchen VICTOZA 18 MG/3ML SOPN INJECT 1.2MG  SUBCUTANEOUSLY DAILY AS DIRECTED.    Marland Kitchen warfarin (COUMADIN) 5 MG tablet Take as directed by Coumadin clinic.    Marland Kitchen allopurinol (ZYLOPRIM) 100 MG  tablet Take 150 mg by mouth every morning. Reported on 06/15/2015    . COLCRYS 0.6 MG tablet Take 0.6 mg by mouth daily as needed (gout flare). Reported on 06/15/2015     PERCOCET Take 1 tablet by mouth every 6 (six) hours as needed for severe pain. (Patient not taking: Reported on 06/15/2015)    . ULTRACET 37.5-325 MG tablet Take 1 tablet by mouth every 6 (six) hours as needed. Reported on 06/15/2015     Review of Systems PER HPI OTHERWISE ALL SYSTEMS ARE NEGATIVE.    Objective:   Physical Exam  Constitutional: She is oriented to person, place, and time. She appears well-developed and well-nourished. No distress.  HENT:  Head: Normocephalic and atraumatic.  Mouth/Throat: Oropharynx is clear and moist. No oropharyngeal exudate.  Eyes: Pupils are equal, round, and reactive to light. No scleral icterus.  Neck: Normal range of motion. Neck supple.  Cardiovascular: Normal rate and regular rhythm.   Murmur heard. Pulmonary/Chest: Effort normal and breath sounds normal. No respiratory distress.  Abdominal: Soft. Bowel sounds are normal. She exhibits no distension. There is no tenderness.  Musculoskeletal: She exhibits edema (TRACE BIL LEs, MULTIPLE VARICOSITIES(R> L)).  Lymphadenopathy:    She has no cervical adenopathy.  Neurological: She is alert and oriented to person, place, and time.  NO FOCAL DEFICITS  Psychiatric: She has a normal mood and affect.  Vitals reviewed.     Assessment & Plan:

## 2015-07-03 ENCOUNTER — Ambulatory Visit (INDEPENDENT_AMBULATORY_CARE_PROVIDER_SITE_OTHER): Payer: Medicaid Other | Admitting: *Deleted

## 2015-07-03 DIAGNOSIS — Z5181 Encounter for therapeutic drug level monitoring: Secondary | ICD-10-CM | POA: Diagnosis not present

## 2015-07-03 DIAGNOSIS — I4891 Unspecified atrial fibrillation: Secondary | ICD-10-CM

## 2015-07-03 LAB — POCT INR: INR: 2.8

## 2015-07-11 LAB — HM DIABETES EYE EXAM

## 2015-07-21 ENCOUNTER — Ambulatory Visit (INDEPENDENT_AMBULATORY_CARE_PROVIDER_SITE_OTHER): Payer: Medicaid Other | Admitting: Internal Medicine

## 2015-07-21 ENCOUNTER — Encounter: Payer: Self-pay | Admitting: Internal Medicine

## 2015-07-21 VITALS — BP 114/70 | HR 75 | Ht 66.0 in | Wt 182.0 lb

## 2015-07-21 DIAGNOSIS — I429 Cardiomyopathy, unspecified: Secondary | ICD-10-CM | POA: Diagnosis not present

## 2015-07-21 DIAGNOSIS — I482 Chronic atrial fibrillation, unspecified: Secondary | ICD-10-CM

## 2015-07-21 DIAGNOSIS — Q246 Congenital heart block: Secondary | ICD-10-CM

## 2015-07-21 DIAGNOSIS — I428 Other cardiomyopathies: Secondary | ICD-10-CM

## 2015-07-21 LAB — CUP PACEART INCLINIC DEVICE CHECK
Date Time Interrogation Session: 20170526040000
HIGH POWER IMPEDANCE MEASURED VALUE: 69 Ohm
Implantable Lead Implant Date: 20150225
Implantable Lead Location: 753858
Lead Channel Impedance Value: 443 Ohm
Lead Channel Pacing Threshold Amplitude: 0.4 V
Lead Channel Pacing Threshold Pulse Width: 0.5 ms
Lead Channel Setting Sensing Sensitivity: 0.6 mV
Lead Channel Setting Sensing Sensitivity: 1 mV
MDC IDC LEAD IMPLANT DT: 20150225
MDC IDC LEAD LOCATION: 753860
MDC IDC LEAD SERIAL: 311545
MDC IDC MSMT LEADCHNL RA IMPEDANCE VALUE: 2500 Ohm — AB
MDC IDC MSMT LEADCHNL RV IMPEDANCE VALUE: 514 Ohm
MDC IDC MSMT LEADCHNL RV PACING THRESHOLD AMPLITUDE: 0.5 V
MDC IDC MSMT LEADCHNL RV PACING THRESHOLD PULSEWIDTH: 0.5 ms
MDC IDC SET LEADCHNL LV PACING AMPLITUDE: 2.4 V
MDC IDC SET LEADCHNL LV PACING PULSEWIDTH: 0.5 ms
MDC IDC SET LEADCHNL RV PACING AMPLITUDE: 2.4 V
MDC IDC SET LEADCHNL RV PACING PULSEWIDTH: 0.5 ms
Pulse Gen Serial Number: 114945

## 2015-07-21 NOTE — Progress Notes (Signed)
HPI Mrs. Catherman returns today for folloow up. She is a pleasant 55 yo woman with a h/o chronic atrial fib and chb, s/p PPM who underwent upgrade to a BiV ICD a year ago. She had class 2b CHF, with pacing induced LBBB and a QRS duration of 170 ms. She feels well. She is exercising regularly without limitation. She does admit to some dietary indiscretion. She has finally gotten her DM under control.  Allergies  Allergen Reactions  . Wheat Swelling  . Latex Rash  . Penicillins Rash  . Sulfa Antibiotics Rash     Current Outpatient Prescriptions  Medication Sig Dispense Refill  . acetaminophen (TYLENOL) 500 MG tablet Take 500 mg by mouth every 6 (six) hours as needed for moderate pain.    Marland Kitchen allopurinol (ZYLOPRIM) 100 MG tablet Take 150 mg by mouth every morning. Reported on 06/15/2015    . benazepril (LOTENSIN) 20 MG tablet Take 10 mg by mouth every morning. Takes 1/2 tablet    . COLCRYS 0.6 MG tablet Take 0.6 mg by mouth daily as needed (gout flare). Reported on 06/15/2015    . COREG CR 40 MG 24 hr capsule TAKE ONE CAPSULE BY MOUTH DAILY. 30 capsule 6  . furosemide (LASIX) 80 MG tablet TAKE (1) TABLET BY MOUTH TWICE DAILY. 60 tablet 6  . HUMULIN N KWIKPEN 100 UNIT/ML Kiwkpen INJECT 34 UNITS SUBCUTANEOUSLY AT BEDTIME. 15 mL 2  . LANOXIN 125 MCG tablet TAKE 1 TABLET BY MOUTH DAILY EXCEPT ON SATURDAY AND SUNDAY. 20 tablet 11  . LANTUS SOLOSTAR 100 UNIT/ML Solostar Pen INJECT 40 UNITS SUBCUTANEOUSLY IN THE MORNING AND 30 UNITS AT BEDTIME. 15 mL 1  . NOVOLOG FLEXPEN 100 UNIT/ML FlexPen INJECT SUBCUTANEOUSLY 12 UNITS BEFORE BREAKFAST; 14 UNITS AT LUNCH; AND 16 UNITS AT DINNER. 15 mL 0  . spironolactone (ALDACTONE) 25 MG tablet Take 25 mg by mouth every morning.     . traMADol-acetaminophen (ULTRACET) 37.5-325 MG tablet Take 1 tablet by mouth every 6 (six) hours as needed. Reported on 06/15/2015    . VICTOZA 18 MG/3ML SOPN INJECT 1.2MG  SUBCUTANEOUSLY DAILY AS DIRECTED. 6 mL 3  . warfarin  (COUMADIN) 5 MG tablet Take as directed by Coumadin clinic. 25 tablet 3   No current facility-administered medications for this visit.     Past Medical History  Diagnosis Date  . Congenital third degree heart block     a. Guidant VVI pacemaker implanted in 09/1997; b. gen change in 01/2004;  c. 03/2013 upgrade to [place], ser# [id number].  Marland Kitchen Chronic atrial fibrillation (HCC)     embolic rind  . Nonischemic cardiomyopathy ([place])     a. 03/2013 Echo: Sev LV dysfxn with sev diff HK, restrictive phys, diast dysfxn, mild MR, sev dil LA/RA, mod PR, PASP 73mmHg;  b. 03/2013: BSX Energen CRTD BiV ICC, ser# [id number].  Marland Kitchen Chronic anticoagulation     a. coumadin.  Marland Kitchen Helicobacter pylori gastritis 08/14/2011  . Dysphagia 08/14/2011    FEB 2013 EGD/DIL 16 MM; GERD; h/o gastroesophageal reflux disease; + H. Pylori gastritis   . Dizziness and giddiness 05/19/2012  . Automatic implantable cardioverter-defibrillator in situ     a. 03/2013: BSX Energen CRTD BiV ICC, ser# [id number]  . Chronic kidney disease     creatinine- 1.8 in 09/2006 and 2.0 in 05/2007; 2.05 in 2011, 2.29 in 2012  . Kidney stones 1990's  . Chronic systolic CHF (congestive heart failure) ([place])  a. 03/2013 Echo: Sev LV dysfxn with sev diff HK, restrictive phys, diast dysfxn, mild MR, sev dil LA/RA, mod PR, PASP 73mmHg.  Marland Kitchen IDDM (insulin dependent diabetes mellitus) ([place])   . GERD (gastroesophageal reflux disease)   . Stroke [facility]) 1999    denies residual on 04/21/2013    ROS:   All systems reviewed and negative except as noted in the HPI.   Past Surgical History  Procedure Laterality Date  . Colonoscopy  04/23/2011    [id number] COLON POLYP/ INTERNAL HEMORRHOIDS/ TORTUOUS COLON  . Upper gastrointestinal endoscopy  FEB 2013    RING/DIL TO 16 MM, H PYLORI GASTRITIS  . Bi-ventricular implantable cardioverter defibrillator  (crt-d)  04/21/2013  . Pacemaker generator change  01/2004  . Insert / replace / remove pacemaker  09/1997      Guidant VVI [employer]  . Bi-ventricular implantable cardioverter defibrillator upgrade N/A 04/21/2013    Procedure: BI-VENTRICULAR IMPLANTABLE CARDIOVERTER DEFIBRILLATOR UPGRADE;  Surgeon: Evans Lance, MD;  Location: [facility] CATH LAB;  Service: Cardiovascular;  Laterality: N/A;     Family History  Problem Relation Age of Onset  . Adopted: Yes  . Colon cancer Neg Hx      Social History   Social History  . Marital Status: Married    Spouse Name: N/A  . Number of Children: 4  . Years of Education: N/A   Occupational History  .     Social History Main Topics  . Smoking status: Never Smoker   . Smokeless tobacco: Never Used     Comment: Never smoked  . Alcohol Use: No  . Drug Use: No  . Sexual Activity: Yes   Other Topics Concern  . Not on file   Social History Narrative     BP 114/70 mmHg  Pulse 75  Ht 5\' 6"  (1.676 m)  Wt 182 lb (82.555 kg)  BMI 29.39 kg/m2  SpO2 96%  Physical Exam:  Well appearing middle aged woman, NAD HEENT: Unremarkable Neck:  No JVD, no thyromegally Back:  No CVA tenderness Lungs:  Clear with no wheezes, rales, or rhonchi. No residual hematoma HEART:  Regular rate rhythm, no murmurs, no rubs, no clicks Abd:  soft, positive bowel sounds, no organomegally, no rebound, no guarding Ext:  2 plus pulses, no edema, no cyanosis, no clubbing Skin:  No rashes no nodules Neuro:  CN II through XII intact, motor grossly intact   DEVICE  Normal device function.  See PaceArt for details.   Assess/Plan: 1. Chronic systolic heart failure - her symptoms are now class 2A. She will continue her current meds and maintain a low sodium diet. 2. Atrial fib - her rates are well controlled. She will continue her warfarin 3. HTN - her blood pressure is well controlled. Will follow. I have encouraged her to lose 5 lbs. 4.  ICD  - her Biv ICD [employer]) is working normally and has 8 years of battery longevity.  Mikle Bosworth.D.

## 2015-07-21 NOTE — Patient Instructions (Signed)
Your physician wants you to follow-up in: 1 Year with Dr. Lovena Le. You will receive a reminder letter in the mail two months in advance. If you don't receive a letter, please call our office to schedule the follow-up appointment.  Remote monitoring is used to monitor your Pacemaker of ICD from home. This monitoring reduces the number of office visits required to check your device to one time per year. It allows [place] to keep an eye on the functioning of your device to ensure it is working properly. You are scheduled for a device check from home on 10/23/15. You may send your transmission at any time that day. If you have a wireless device, the transmission will be sent automatically. After your physician reviews your transmission, you will receive a postcard with your next transmission date.   Your physician recommends that you continue on your current medications as directed. Please refer to the Current Medication list given to you today.  If you need a refill on your cardiac medications before your next appointment, please call your pharmacy.  Thank you for choosing [facility]!

## 2015-07-26 ENCOUNTER — Other Ambulatory Visit: Payer: Self-pay | Admitting: Endocrinology

## 2015-08-14 ENCOUNTER — Other Ambulatory Visit: Payer: Self-pay | Admitting: Cardiovascular Disease

## 2015-08-14 ENCOUNTER — Ambulatory Visit (INDEPENDENT_AMBULATORY_CARE_PROVIDER_SITE_OTHER): Payer: Medicaid Other | Admitting: *Deleted

## 2015-08-14 DIAGNOSIS — Z5181 Encounter for therapeutic drug level monitoring: Secondary | ICD-10-CM | POA: Diagnosis not present

## 2015-08-14 DIAGNOSIS — I4891 Unspecified atrial fibrillation: Secondary | ICD-10-CM | POA: Diagnosis not present

## 2015-08-14 LAB — POCT INR: INR: 5

## 2015-08-23 ENCOUNTER — Ambulatory Visit (INDEPENDENT_AMBULATORY_CARE_PROVIDER_SITE_OTHER): Payer: Medicaid Other | Admitting: *Deleted

## 2015-08-23 DIAGNOSIS — I4891 Unspecified atrial fibrillation: Secondary | ICD-10-CM

## 2015-08-23 DIAGNOSIS — Z5181 Encounter for therapeutic drug level monitoring: Secondary | ICD-10-CM | POA: Diagnosis not present

## 2015-08-23 LAB — POCT INR: INR: 2.8

## 2015-08-28 ENCOUNTER — Other Ambulatory Visit: Payer: Self-pay | Admitting: Endocrinology

## 2015-08-28 ENCOUNTER — Other Ambulatory Visit: Payer: Self-pay | Admitting: Cardiovascular Disease

## 2015-09-19 ENCOUNTER — Other Ambulatory Visit: Payer: Self-pay | Admitting: Endocrinology

## 2015-09-20 ENCOUNTER — Ambulatory Visit (INDEPENDENT_AMBULATORY_CARE_PROVIDER_SITE_OTHER): Payer: Medicaid Other | Admitting: *Deleted

## 2015-09-20 DIAGNOSIS — I4891 Unspecified atrial fibrillation: Secondary | ICD-10-CM

## 2015-09-20 DIAGNOSIS — Z5181 Encounter for therapeutic drug level monitoring: Secondary | ICD-10-CM | POA: Diagnosis not present

## 2015-09-20 LAB — POCT INR: INR: 2.9

## 2015-09-22 ENCOUNTER — Other Ambulatory Visit (INDEPENDENT_AMBULATORY_CARE_PROVIDER_SITE_OTHER): Payer: Medicaid Other

## 2015-09-22 ENCOUNTER — Ambulatory Visit: Payer: Medicaid Other | Admitting: Endocrinology

## 2015-09-22 DIAGNOSIS — E1129 Type 2 diabetes mellitus with other diabetic kidney complication: Secondary | ICD-10-CM | POA: Diagnosis not present

## 2015-09-22 DIAGNOSIS — Z794 Long term (current) use of insulin: Secondary | ICD-10-CM

## 2015-09-22 LAB — LIPID PANEL
Cholesterol: 131 mg/dL (ref 0–200)
HDL: 23.3 mg/dL — ABNORMAL LOW (ref >39.00)
NONHDL: 107.46
Total CHOL/HDL Ratio: 6
Triglycerides: 247 mg/dL — ABNORMAL HIGH (ref 0.0–149.0)
VLDL: 49.4 mg/dL — ABNORMAL HIGH (ref 0.0–40.0)

## 2015-09-22 LAB — COMPREHENSIVE METABOLIC PANEL
ALT: 25 U/L (ref 0–35)
AST: 27 U/L (ref 0–37)
Albumin: 4.1 g/dL (ref 3.5–5.2)
Alkaline Phosphatase: 130 U/L — ABNORMAL HIGH (ref 39–117)
BILIRUBIN TOTAL: 0.9 mg/dL (ref 0.2–1.2)
BUN: 67 mg/dL — AB (ref 6–23)
CALCIUM: 9.7 mg/dL (ref 8.4–10.5)
CO2: 26 meq/L (ref 19–32)
CREATININE: 2.46 mg/dL — AB (ref 0.40–1.20)
Chloride: 101 mEq/L (ref 96–112)
GFR: 26.07 mL/min — ABNORMAL LOW (ref >60.00)
GLUCOSE: 177 mg/dL — AB (ref 70–99)
Potassium: 4.4 mEq/L (ref 3.5–5.1)
Sodium: 135 mEq/L (ref 135–145)
Total Protein: 7.7 g/dL (ref 6.0–8.3)

## 2015-09-22 LAB — LDL CHOLESTEROL, DIRECT: LDL DIRECT: 65 mg/dL

## 2015-09-23 LAB — FRUCTOSAMINE: FRUCTOSAMINE: 398 umol/L — AB (ref 0–285)

## 2015-09-27 ENCOUNTER — Ambulatory Visit: Payer: Medicaid Other | Admitting: Endocrinology

## 2015-10-18 ENCOUNTER — Ambulatory Visit (INDEPENDENT_AMBULATORY_CARE_PROVIDER_SITE_OTHER): Payer: Medicaid Other | Admitting: *Deleted

## 2015-10-18 DIAGNOSIS — Z5181 Encounter for therapeutic drug level monitoring: Secondary | ICD-10-CM | POA: Diagnosis not present

## 2015-10-18 DIAGNOSIS — I4891 Unspecified atrial fibrillation: Secondary | ICD-10-CM | POA: Diagnosis not present

## 2015-10-18 LAB — POCT INR: INR: 4.2

## 2015-10-20 ENCOUNTER — Other Ambulatory Visit: Payer: Self-pay | Admitting: Internal Medicine

## 2015-10-20 ENCOUNTER — Other Ambulatory Visit: Payer: Self-pay | Admitting: Endocrinology

## 2015-10-23 ENCOUNTER — Telehealth: Payer: Self-pay

## 2015-10-23 ENCOUNTER — Ambulatory Visit (INDEPENDENT_AMBULATORY_CARE_PROVIDER_SITE_OTHER): Payer: Medicaid Other | Admitting: *Deleted

## 2015-10-23 DIAGNOSIS — Z95810 Presence of automatic (implantable) cardiac defibrillator: Secondary | ICD-10-CM

## 2015-10-23 DIAGNOSIS — Q246 Congenital heart block: Secondary | ICD-10-CM

## 2015-10-23 NOTE — Telephone Encounter (Signed)
Pt called and c/o hoarseness x 2 weeks. She stopped Protonix per her kidney dr. She feels hoarseness is from reflux but not having reflux symptoms.

## 2015-10-24 ENCOUNTER — Ambulatory Visit (INDEPENDENT_AMBULATORY_CARE_PROVIDER_SITE_OTHER): Payer: Medicaid Other | Admitting: Endocrinology

## 2015-10-24 ENCOUNTER — Encounter: Payer: Self-pay | Admitting: Endocrinology

## 2015-10-24 VITALS — BP 123/75 | HR 68 | Ht 66.0 in | Wt 185.0 lb

## 2015-10-24 DIAGNOSIS — E1165 Type 2 diabetes mellitus with hyperglycemia: Secondary | ICD-10-CM

## 2015-10-24 DIAGNOSIS — Z794 Long term (current) use of insulin: Secondary | ICD-10-CM | POA: Diagnosis not present

## 2015-10-24 LAB — POCT GLYCOSYLATED HEMOGLOBIN (HGB A1C): Hemoglobin A1C: 8.6

## 2015-10-24 MED ORDER — INSULIN GLARGINE 300 UNIT/ML ~~LOC~~ SOPN: 56.0000 [IU] | PEN_INJECTOR | Freq: Every day | SUBCUTANEOUS | 3 refills | Status: DC

## 2015-10-24 NOTE — Progress Notes (Signed)
Remote ICD transmission.   

## 2015-10-24 NOTE — Progress Notes (Signed)
Patient ID: [name], female   DOB: 1960-08-16, 55 y.o.   MRN: [id number]   Reason for Appointment : Follow up for Type 1 Diabetes  History of Present Illness           Diagnosis: Type 2 diabetes mellitus, date of diagnosis: 1990        Past history: She has been on insulin since 2007. Generally has had poor control in requiring large doses of insulin. Blood sugars also have been previously variable making her control difficult. She has had difficulty following instructions for mealtime insulin consistently in the past  Has been on a multiple injection regimen of Lantus twice a day, NovoLog a.c. and NPH at bedtime A1c has been as high as 11.3 previously  INSULIN regimen is described as: Lantus a.m. 30 - 26 in PM. Humulin N 24 hs, NovoLog a.c.12- 14-16 before meals  Recent history:  She had worsening control of her diabetes and A1c has gone up.  0.6, previously 7.6 and as low as 7.2  She is continuing her basal bolus insulin regimen as well as Victoza  Glucose patterns , current management and problems identified:  She has checked her blood sugars mostly in the morning again and these are on an average fairly good but sometimes significantly high  Her high readings in the mornings of probably related to not taking her evening doses at bedtime when she falls asleep.  She probably has not been going for exercise recently and is gaining weight despite taking Victoza  INSULIN compliance:  She believes has been compliant with taking her Novolog but is not adjusting the dose based on what she is eating and may also not take the full 16 units at suppertime  Previously her sugars were better when she was consistently watching her diet  However she thinks recent higher blood sugars are related to her stress with family issues and not taking care of her diabetes but she cannot pinpoint anything except noncompliance sometimes with the time insulin as a cause of high  readings  Checking blood sugars only rarely after meals and only occasionally at bedtime  Glucose monitoring:  done  1-2 times a day         Glucometer:  Accu-Chek Aviva       Blood Glucose readings from meter download:   Mean values apply above for all meters except median for One Touch  PRE-MEAL Fasting Lunch Dinner PCS  Overall  Glucose range: 74-366  88-186  90  61-515    Mean/median: 152     167     Physical activity: exercise: WalkingOccasionally only  Self-care: The diet that the patient has been following is low fat,  Lunch : Sometimes salad otherwise sandwiches  Meals: 3 meals per day.  breakfast is at lunch 12 pm Supper at about 6-7 PM, mealtimes are variable          Dietician visit: Most recent:.Several years ago Last CD consult in 10/2013            Wt Readings from Last 3 Encounters:  10/24/15 185 lb (83.9 kg)  07/21/15 182 lb (82.6 kg)  06/15/15 179 lb 3.2 oz (81.3 kg)   A1c results  Lab Results  Component Value Date   HGBA1C 8.6 10/24/2015   HGBA1C 7.6 05/31/2015   HGBA1C 7.2 01/23/2015   Lab Results  Component Value Date   MICROALBUR 1.9 10/14/2014   LDLCALC 93 01/23/2015   CREATININE 2.46 (H)  09/22/2015     Lab Results  Component Value Date   MICROALBUR 1.9 10/14/2014       Medication List       Accurate as of 10/24/15  5:01 PM. Always use your most recent med list.          acetaminophen 500 MG tablet Commonly known as:  TYLENOL Take 500 mg by mouth every 6 (six) hours as needed for moderate pain.   allopurinol 100 MG tablet Commonly known as:  ZYLOPRIM Take 150 mg by mouth every morning. Reported on 06/15/2015   benazepril 20 MG tablet Commonly known as:  LOTENSIN Take 10 mg by mouth every morning. Takes 1/2 tablet   COLCRYS 0.6 MG tablet Generic drug:  colchicine Take 0.6 mg by mouth daily as needed (gout flare). Reported on 06/15/2015   COREG CR 40 MG 24 hr capsule Generic drug:  carvedilol TAKE ONE CAPSULE BY MOUTH DAILY.    furosemide 80 MG tablet Commonly known as:  LASIX TAKE (1) TABLET BY MOUTH TWICE DAILY.   HUMULIN N KWIKPEN 100 UNIT/ML Kiwkpen Generic drug:  Insulin NPH (Human) ([place]) INJECT 34 UNITS SUBCUTANEOUSLY AT BEDTIME.   Insulin Glargine 300 UNIT/ML Sopn Commonly known as:  TOUJEO SOLOSTAR Inject 56 Units into the skin daily before breakfast.   LANOXIN 0.125 MG tablet Generic drug:  digoxin TAKE 1 TABLET BY MOUTH DAILY EXCEPT ON SATURDAY AND SUNDAY.   NOVOLOG FLEXPEN 100 UNIT/ML FlexPen Generic drug:  insulin aspart INJECT SUBCUTANEOUSLY 12 UNITS BEFORE BREAKFAST; 14 UNITS AT LUNCH; AND 16 UNITS AT DINNER.   spironolactone 25 MG tablet Commonly known as:  ALDACTONE Take 25 mg by mouth every morning.   traMADol-acetaminophen 37.5-325 MG tablet Commonly known as:  ULTRACET Take 1 tablet by mouth every 6 (six) hours as needed. Reported on 06/15/2015   VICTOZA 18 MG/3ML Sopn Generic drug:  Liraglutide INJECT 1.2MG  SUBCUTANEOUSLY DAILY AS DIRECTED.   warfarin 5 MG tablet Commonly known as:  COUMADIN Take 1 tablet (5 mg total) by mouth one time only at 6 PM. 1/2 Tablet Daily Except 1 Tablet on Mondays, Wednesdays and Fridays       Allergies:  Allergies  Allergen Reactions  . Wheat Swelling  . Latex Rash  . Penicillins Rash  . Sulfa Antibiotics Rash    Past Medical History:  Diagnosis Date  . Automatic implantable cardioverter-defibrillator in situ    a. 03/2013: BSX Energen CRTD BiV ICC, ser# [id number]  . Chronic anticoagulation    a. coumadin.  . Chronic atrial fibrillation (HCC)    embolic rind  . Chronic kidney disease    creatinine- 1.8 in 09/2006 and 2.0 in 05/2007; 2.05 in 2011, 2.29 in 2012  . Chronic systolic CHF (congestive heart failure) ([place])    a. 03/2013 Echo: Sev LV dysfxn with sev diff HK, restrictive phys, diast dysfxn, mild MR, sev dil LA/RA, mod PR, PASP 32mmHg.  . Congenital third degree heart block    a. Guidant VVI pacemaker implanted in 09/1997; b.  gen change in 01/2004;  c. 03/2013 upgrade to [place], ser# [id number].  . Dizziness and giddiness 05/19/2012  . Dysphagia 08/14/2011   FEB 2013 EGD/DIL 16 MM; GERD; h/o gastroesophageal reflux disease; + H. Pylori gastritis   . GERD (gastroesophageal reflux disease)   . Helicobacter pylori gastritis 08/14/2011  . IDDM (insulin dependent diabetes mellitus) ([place])   . Kidney stones 1990's  . Nonischemic cardiomyopathy ([place])    a. 03/2013 Echo: Sev  LV dysfxn with sev diff HK, restrictive phys, diast dysfxn, mild MR, sev dil LA/RA, mod PR, PASP 52mmHg;  b. 03/2013: BSX Energen CRTD BiV ICC, ser# [id number].  Marland Kitchen Stroke [facility]) 1999   denies residual on 04/21/2013    Past Surgical History:  Procedure Laterality Date  . BI-VENTRICULAR IMPLANTABLE CARDIOVERTER DEFIBRILLATOR  (CRT-D)  04/21/2013  . BI-VENTRICULAR IMPLANTABLE CARDIOVERTER DEFIBRILLATOR UPGRADE N/A 04/21/2013   Procedure: BI-VENTRICULAR IMPLANTABLE CARDIOVERTER DEFIBRILLATOR UPGRADE;  Surgeon: Evans Lance, MD;  Location: [facility] CATH LAB;  Service: Cardiovascular;  Laterality: N/A;  . COLONOSCOPY  04/23/2011   [id number] COLON POLYP/ INTERNAL HEMORRHOIDS/ TORTUOUS COLON  . INSERT / REPLACE / REMOVE PACEMAKER  09/1997   Guidant VVI [employer]  . PACEMAKER GENERATOR CHANGE  01/2004  . UPPER GASTROINTESTINAL ENDOSCOPY  FEB 2013   RING/DIL TO 16 MM, H PYLORI GASTRITIS    Family History  Problem Relation Age of Onset  . Adopted: Yes  . Colon cancer Neg Hx     Social History:  reports that she has never smoked. She has never used smokeless tobacco. She reports that she does not drink alcohol or use drugs.    Review of Systems:   She has had stable chronic renal insufficiency, followed by nephrologist. Etiology unclear. No history of microalbuminuria ;she had labs done last week and will be seeing her nephrologist tomorrow, reports not available   Lab Results  Component Value Date   CREATININE 2.46 (H) 09/22/2015   BUN  67 (H) 09/22/2015   NA 135 09/22/2015   K 4.4 09/22/2015   CL 101 09/22/2015   CO2 26 09/22/2015    She has had cardiomyopathy and atrial fibrillation followed by cardiologist  Lipids: Has dyslipidemia with low HDL, high triglycerides, not on any treatment  Lab Results  Component Value Date   CHOL 131 09/22/2015   HDL 23.30 (L) 09/22/2015   LDLCALC 93 01/23/2015   LDLDIRECT 65.0 09/22/2015   TRIG 247.0 (H) 09/22/2015   CHOLHDL 6 09/22/2015   Foot exam in 10/16 normal except absent pedal pulses on the right  Physical Examination:  BP 123/75   Pulse 68   Ht 5\' 6"  (1.676 m)   Wt 185 lb (83.9 kg)   BMI 29.86 kg/m        Diabetic foot exam shows normal monofilament sensation in the toes and plantar surfaces, no skin lesions or ulcers on the feet and normal pedal pulses on the left   ASSESSMENT:  Diabetes type 1:  See history of present illness for detailed discussion of her current blood sugar patterns, treatment regimen, meal planning and problems identified  Her blood sugars are overall poorly controlled, A1c now 8.6% and 1% higher than before Fructosamine also was high last month Also she has gained weight Because of her monitoring blood sugars only fasting and not after meals difficult to get her blood sugar patterns She has some difficulty with consistent compliance with her bedtime insulin doses also Has gained weight  Fasting blood sugars are generally fairly good but sometimes high for that reason She thinks she can do better with her compliance since her family stress has reduced now   PLAN:     No change in  Basic insulin regimen for now  However when she runs out of Lantus she can switch to Toujeo 56 units in the morning  Showed her how she could potentially use the V-go pump but she refuses to consider this now   She needs  to be adjusting her Novolog at meals especially lunch and supper based on her carbohydrate intake as discussed several times    Also needs to check blood sugars more consistently in between meals or after supper, discussed blood sugar targets at various times  Restart regular exercise  Consistent diet  Call if blood sugars are not well-controlled  Patient Instructions  Take nite shots at 9-9.30 pm  Less checking in am and more at mealtimes and bedtime   Also check blood sugars about 2 hours after a meal and do this after different meals by rotation  Recommended blood sugar levels on waking up is 90-130 and about 2 hours after meal is 130-160  Please bring your blood sugar monitor to each visit, thank you  Walk daily  Toujeo replaces Lantus, 56 in am only        Counseling time on subjects discussed above is over 50% of today's 25 minute visit    Juletta Berhe 10/24/2015, 5:01 PM

## 2015-10-24 NOTE — Patient Instructions (Addendum)
Take nite shots at 9-9.30 pm  Less checking in am and more at mealtimes and bedtime   Also check blood sugars about 2 hours after a meal and do this after different meals by rotation  Recommended blood sugar levels on waking up is 90-130 and about 2 hours after meal is 130-160  Please bring your blood sugar monitor to each visit, thank you  Walk daily  Toujeo replaces Lantus, 56 in am only

## 2015-10-25 LAB — POCT GLYCOSYLATED HEMOGLOBIN (HGB A1C): HEMOGLOBIN A1C: 8.6

## 2015-10-25 NOTE — Addendum Note (Signed)
Addended by: Nile Riggs on: 10/25/2015 07:55 AM   Modules accepted: Orders

## 2015-10-26 ENCOUNTER — Encounter: Payer: Self-pay | Admitting: Cardiology

## 2015-10-27 NOTE — Telephone Encounter (Signed)
PLEASE CALL PT. When she was seen in APR 2017 she wasn't taking a PPI ([place]). IT IS POSSIBLE SHE IS HAVING HOARSENESS DUE TO REFLUX.  SHE SHOULD AVOID THINGS THAT TRIGGER REFLUX. SHE SHOULD FOLLOW LIFESTYLE RECOMMENDATION TO HELP MANAGE REFLUX. IF HER KIDNEY DOC DOESN'T WANT HER ON A PPI THEN SHE SHOULD Use PEPCID OR ZANTAC BID FOR MON-FRI TO CONTROL heartburn or indigestion.   Lifestyle and home remedies TO MANAGE REFLUX/CHEST PAIN  You may eliminate or reduce the frequency of heartburn by making the following lifestyle changes:  . Control your weight. Being overweight is a major risk factor for heartburn and GERD. Excess pounds put pressure on your abdomen, pushing up your stomach and causing acid to back up into your esophagus.   . Eat smaller meals. 4 TO 6 MEALS A DAY. This reduces pressure on the lower esophageal sphincter, helping to prevent the valve from opening and acid from washing back into your esophagus.   Dolphus Jenny your belt. Clothes that fit tightly around your waist put pressure on your abdomen and the lower esophageal sphincter.   . Eliminate heartburn triggers. Everyone has specific triggers. Common triggers such as fatty or fried foods, spicy food, tomato sauce, carbonated beverages, alcohol, chocolate, mint, garlic, onion, caffeine and nicotine may make heartburn worse.   Marland Kitchen Avoid stooping or bending. Tying your shoes is OK. Bending over for longer periods to weed your garden isn't, especially soon after eating.   . Don't lie down after a meal. Wait at least three to four hours after eating before going to bed, and don't lie down right after eating.   Marland Kitchen PUT THE HEAD OF YOUR BED ON 6 INCH BLOCKS.   Alternative medicine . Several home remedies exist for treating GERD, but they provide only temporary relief. They include drinking baking soda (sodium bicarbonate) added to water or drinking other fluids such as baking soda mixed with cream of tartar and water.  . Although  these liquids create temporary relief by neutralizing, washing away or buffering acids, eventually they aggravate the situation by adding gas and fluid to your stomach, increasing pressure and causing more acid reflux. Further, adding more sodium to your diet may increase your blood pressure and add stress to your heart, and excessive bicarbonate ingestion can alter the acid-base balance in your body.

## 2015-10-30 ENCOUNTER — Other Ambulatory Visit: Payer: Self-pay | Admitting: Endocrinology

## 2015-10-31 ENCOUNTER — Other Ambulatory Visit: Payer: Self-pay | Admitting: *Deleted

## 2015-10-31 MED ORDER — INSULIN DEGLUDEC 200 UNIT/ML ~~LOC~~ SOPN: 56.0000 [IU] | PEN_INJECTOR | Freq: Every day | SUBCUTANEOUS | 3 refills | Status: DC

## 2015-10-31 NOTE — Telephone Encounter (Signed)
I called and informed pt and she wanted to come in and see Dr. Oneida Alar and discuss her problems.

## 2015-10-31 NOTE — Telephone Encounter (Signed)
OV with Dr. Oneida Alar on 11/02/2015 at 10:00 Am.

## 2015-11-02 ENCOUNTER — Ambulatory Visit: Payer: Medicaid Other | Admitting: Gastroenterology

## 2015-11-02 LAB — CUP PACEART REMOTE DEVICE CHECK
HighPow Impedance: 70 Ohm
Implantable Lead Implant Date: 20150225
Implantable Lead Implant Date: 20150225
Implantable Lead Location: 753860
Implantable Lead Model: 180
Implantable Lead Serial Number: 311545
Lead Channel Impedance Value: 442 Ohm
Lead Channel Impedance Value: 504 Ohm
Lead Channel Pacing Threshold Amplitude: 0.5 V
Lead Channel Pacing Threshold Pulse Width: 0.5 ms
Lead Channel Setting Pacing Amplitude: 2.4 V
Lead Channel Setting Pacing Pulse Width: 0.5 ms
Lead Channel Setting Sensing Sensitivity: 0.6 mV
Lead Channel Setting Sensing Sensitivity: 1 mV
MDC IDC LEAD LOCATION: 753858
MDC IDC MSMT BATTERY REMAINING LONGEVITY: 90 mo
MDC IDC MSMT BATTERY REMAINING PERCENTAGE: 100 %
MDC IDC MSMT LEADCHNL LV PACING THRESHOLD AMPLITUDE: 0.3 V
MDC IDC MSMT LEADCHNL LV PACING THRESHOLD PULSEWIDTH: 0.5 ms
MDC IDC SESS DTM: 20170828095800
MDC IDC SET LEADCHNL LV PACING AMPLITUDE: 2.4 V
MDC IDC SET LEADCHNL LV PACING PULSEWIDTH: 0.5 ms
MDC IDC STAT BRADY RA PERCENT PACED: 0 %
MDC IDC STAT BRADY RV PERCENT PACED: 98 %
Pulse Gen Serial Number: 114945

## 2015-11-04 ENCOUNTER — Other Ambulatory Visit: Payer: Self-pay | Admitting: Endocrinology

## 2015-11-04 ENCOUNTER — Other Ambulatory Visit: Payer: Self-pay | Admitting: Internal Medicine

## 2015-11-06 ENCOUNTER — Ambulatory Visit (INDEPENDENT_AMBULATORY_CARE_PROVIDER_SITE_OTHER): Payer: Medicaid Other | Admitting: *Deleted

## 2015-11-06 DIAGNOSIS — I4891 Unspecified atrial fibrillation: Secondary | ICD-10-CM | POA: Diagnosis not present

## 2015-11-06 DIAGNOSIS — Z5181 Encounter for therapeutic drug level monitoring: Secondary | ICD-10-CM

## 2015-11-06 LAB — POCT INR: INR: 3.5

## 2015-11-10 ENCOUNTER — Encounter: Payer: Self-pay | Admitting: Cardiology

## 2015-11-20 ENCOUNTER — Ambulatory Visit (INDEPENDENT_AMBULATORY_CARE_PROVIDER_SITE_OTHER): Payer: Medicaid Other | Admitting: *Deleted

## 2015-11-20 DIAGNOSIS — I4891 Unspecified atrial fibrillation: Secondary | ICD-10-CM

## 2015-11-20 DIAGNOSIS — Z5181 Encounter for therapeutic drug level monitoring: Secondary | ICD-10-CM | POA: Diagnosis not present

## 2015-11-20 LAB — POCT INR: INR: 2.6

## 2015-12-07 ENCOUNTER — Other Ambulatory Visit: Payer: Self-pay | Admitting: Obstetrics & Gynecology

## 2015-12-07 DIAGNOSIS — Z1231 Encounter for screening mammogram for malignant neoplasm of breast: Secondary | ICD-10-CM

## 2015-12-12 ENCOUNTER — Other Ambulatory Visit: Payer: Self-pay | Admitting: Endocrinology

## 2015-12-15 ENCOUNTER — Ambulatory Visit (HOSPITAL_COMMUNITY): Payer: Medicaid Other

## 2015-12-18 ENCOUNTER — Encounter: Payer: Self-pay | Admitting: *Deleted

## 2015-12-20 ENCOUNTER — Ambulatory Visit (INDEPENDENT_AMBULATORY_CARE_PROVIDER_SITE_OTHER): Payer: Medicaid Other | Admitting: *Deleted

## 2015-12-20 DIAGNOSIS — I4891 Unspecified atrial fibrillation: Secondary | ICD-10-CM | POA: Diagnosis not present

## 2015-12-20 DIAGNOSIS — Z5181 Encounter for therapeutic drug level monitoring: Secondary | ICD-10-CM

## 2015-12-20 LAB — POCT INR: INR: 4.3

## 2015-12-21 ENCOUNTER — Other Ambulatory Visit: Payer: Medicaid Other | Admitting: Obstetrics & Gynecology

## 2015-12-23 ENCOUNTER — Other Ambulatory Visit: Payer: Self-pay | Admitting: Endocrinology

## 2015-12-24 NOTE — Telephone Encounter (Signed)
She was supposed to switch from Lantus to [facility], please check the patient about this

## 2015-12-25 NOTE — Telephone Encounter (Signed)
Spoke with the patient and she will switch once she is out of the Lantus

## 2015-12-26 ENCOUNTER — Ambulatory Visit: Payer: Medicaid Other | Admitting: Endocrinology

## 2016-01-02 ENCOUNTER — Telehealth: Payer: Self-pay | Admitting: Cardiovascular Disease

## 2016-01-02 DIAGNOSIS — M79605 Pain in left leg: Principal | ICD-10-CM

## 2016-01-02 DIAGNOSIS — M79604 Pain in right leg: Secondary | ICD-10-CM

## 2016-01-02 NOTE — Telephone Encounter (Signed)
Patient is requesting referral to [facility] for leg pain. / tg

## 2016-01-03 ENCOUNTER — Ambulatory Visit (INDEPENDENT_AMBULATORY_CARE_PROVIDER_SITE_OTHER): Payer: Medicaid Other | Admitting: *Deleted

## 2016-01-03 DIAGNOSIS — I4891 Unspecified atrial fibrillation: Secondary | ICD-10-CM

## 2016-01-03 DIAGNOSIS — Z5181 Encounter for therapeutic drug level monitoring: Secondary | ICD-10-CM | POA: Diagnosis not present

## 2016-01-03 LAB — POCT INR: INR: 3.3

## 2016-01-03 NOTE — Telephone Encounter (Signed)
[name]'s patient would start with ABI's and LE arterial duplex and send to [facility] if PVD found

## 2016-01-03 NOTE — Telephone Encounter (Signed)
Pt made aware. Put in orders for tests. Sent Mitzie Na A message to schedule tests.

## 2016-01-03 NOTE — Telephone Encounter (Signed)
Pt is requesting a referral to [facility], due to her leg pain. Please advise.

## 2016-01-05 ENCOUNTER — Encounter: Payer: Self-pay | Admitting: Obstetrics & Gynecology

## 2016-01-05 ENCOUNTER — Other Ambulatory Visit (HOSPITAL_COMMUNITY)
Admission: RE | Admit: 2016-01-05 | Discharge: 2016-01-05 | Disposition: A | Discharge status: Home / Self Care | Payer: Medicaid Other | Source: Ambulatory Visit | Attending: Obstetrics & Gynecology | Admitting: Obstetrics & Gynecology

## 2016-01-05 ENCOUNTER — Ambulatory Visit (INDEPENDENT_AMBULATORY_CARE_PROVIDER_SITE_OTHER): Payer: Medicaid Other | Admitting: Obstetrics & Gynecology

## 2016-01-05 VITALS — BP 110/70 | HR 72 | Ht 64.0 in | Wt 187.0 lb

## 2016-01-05 DIAGNOSIS — Z0000 Encounter for general adult medical examination without abnormal findings: Secondary | ICD-10-CM

## 2016-01-05 DIAGNOSIS — Z01419 Encounter for gynecological examination (general) (routine) without abnormal findings: Secondary | ICD-10-CM

## 2016-01-05 DIAGNOSIS — Z113 Encounter for screening for infections with a predominantly sexual mode of transmission: Secondary | ICD-10-CM | POA: Diagnosis present

## 2016-01-05 DIAGNOSIS — Z1211 Encounter for screening for malignant neoplasm of colon: Secondary | ICD-10-CM | POA: Diagnosis not present

## 2016-01-05 DIAGNOSIS — Z1212 Encounter for screening for malignant neoplasm of rectum: Secondary | ICD-10-CM

## 2016-01-05 NOTE — Progress Notes (Signed)
Subjective:     [name] is a 55 y.o. female here for a routine exam.  No LMP recorded. Patient is postmenopausal. No obstetric history on file. Birth Control Method:  Post menopausal Menstrual Calendar(currently): amenorrheic  Current complaints: none.   Current acute medical issues:  CHF   Recent Gynecologic History No LMP recorded. Patient is postmenopausal. Last Pap: 2017,  normal Last mammogram: 2016,  normal  Past Medical History:  Diagnosis Date  . Automatic implantable cardioverter-defibrillator in situ    a. 03/2013: BSX Energen CRTD BiV ICC, ser# [id number]  . Chronic anticoagulation    a. coumadin.  . Chronic atrial fibrillation (HCC)    embolic rind  . Chronic kidney disease    creatinine- 1.8 in 09/2006 and 2.0 in 05/2007; 2.05 in 2011, 2.29 in 2012  . Chronic systolic CHF (congestive heart failure) ([place])    a. 03/2013 Echo: Sev LV dysfxn with sev diff HK, restrictive phys, diast dysfxn, mild MR, sev dil LA/RA, mod PR, PASP 76mmHg.  . Congenital third degree heart block    a. Guidant VVI pacemaker implanted in 09/1997; b. gen change in 01/2004;  c. 03/2013 upgrade to [employer], ser# [id number].  . Dizziness and giddiness 05/19/2012  . Dysphagia 08/14/2011   FEB 2013 EGD/DIL 16 MM; GERD; h/o gastroesophageal reflux disease; + H. Pylori gastritis   . GERD (gastroesophageal reflux disease)   . Helicobacter pylori gastritis 08/14/2011  . IDDM (insulin dependent diabetes mellitus) ([place])   . Kidney stones 1990's  . Nonischemic cardiomyopathy ([place])    a. 03/2013 Echo: Sev LV dysfxn with sev diff HK, restrictive phys, diast dysfxn, mild MR, sev dil LA/RA, mod PR, PASP 72mmHg;  b. 03/2013: BSX Energen CRTD BiV ICC, ser# [id number].  Marland Kitchen Stroke [facility]) 1999   denies residual on 04/21/2013    Past Surgical History:  Procedure Laterality Date  . BI-VENTRICULAR IMPLANTABLE CARDIOVERTER DEFIBRILLATOR  (CRT-D)  04/21/2013  . BI-VENTRICULAR IMPLANTABLE CARDIOVERTER DEFIBRILLATOR  UPGRADE N/A 04/21/2013   Procedure: BI-VENTRICULAR IMPLANTABLE CARDIOVERTER DEFIBRILLATOR UPGRADE;  Surgeon: Evans Lance, MD;  Location: [facility] CATH LAB;  Service: Cardiovascular;  Laterality: N/A;  . COLONOSCOPY  04/23/2011   EHM:CNOBSJGGEZ COLON POLYP/ INTERNAL HEMORRHOIDS/ TORTUOUS COLON  . INSERT / REPLACE / REMOVE PACEMAKER  09/1997   Guidant VVI [employer]  . PACEMAKER GENERATOR CHANGE  01/2004  . UPPER GASTROINTESTINAL ENDOSCOPY  FEB 2013   RING/DIL TO 16 MM, H PYLORI GASTRITIS    OB History    No data available      Social History   Social History  . Marital status: Married    Spouse name: N/A  . Number of children: 4  . Years of education: N/A   Occupational History  .  Unemployed   Social History Main Topics  . Smoking status: Never Smoker  . Smokeless tobacco: Never Used     Comment: Never smoked  . Alcohol use No  . Drug use: No  . Sexual activity: Yes   Other Topics Concern  . None   Social History Narrative  . None    Family History  Problem Relation Age of Onset  . Adopted: Yes  . Colon cancer Neg Hx      Current Outpatient Prescriptions:  .  acetaminophen (TYLENOL) 500 MG tablet, Take 500 mg by mouth every 6 (six) hours as needed for moderate pain., Disp: , Rfl:  .  allopurinol (ZYLOPRIM) 100 MG tablet, Take 150 mg by mouth  every morning. Reported on 06/15/2015, Disp: , Rfl:  .  benazepril (LOTENSIN) 20 MG tablet, Take 10 mg by mouth every morning. Takes 1/2 tablet, Disp: , Rfl:  .  calcitRIOL (ROCALTROL) 0.25 MCG capsule, Take 0.25 mcg by mouth daily., Disp: , Rfl:  .  COLCRYS 0.6 MG tablet, Take 0.6 mg by mouth daily as needed (gout flare). Reported on 06/15/2015, Disp: , Rfl:  .  COREG CR 40 MG 24 hr capsule, TAKE ONE CAPSULE BY MOUTH DAILY., Disp: 30 capsule, Rfl: 11 .  furosemide (LASIX) 80 MG tablet, TAKE (1) TABLET BY MOUTH TWICE DAILY., Disp: 60 tablet, Rfl: 3 .  HUMULIN N KWIKPEN 100 UNIT/ML Kiwkpen, INJECT 34 UNITS SUBCUTANEOUSLY AT  BEDTIME., Disp: 15 mL, Rfl: 3 .  Insulin Degludec (TRESIBA FLEXTOUCH) 200 UNIT/ML SOPN, Inject 56 Units into the skin daily with breakfast., Disp: 30 mL, Rfl: 3 .  Insulin Glargine (TOUJEO SOLOSTAR) 300 UNIT/ML SOPN, Inject 56 Units into the skin daily before breakfast., Disp: 6 pen, Rfl: 3 .  LANOXIN 125 MCG tablet, TAKE 1 TABLET BY MOUTH DAILY EXCEPT ON SATURDAY AND SUNDAY., Disp: 20 tablet, Rfl: 3 .  LANTUS SOLOSTAR 100 UNIT/ML Solostar Pen, INJECT 40 UNITS SUBCUTANEOUSLY IN THE MORNING AND 30 UNITS AT BEDTIME., Disp: 15 mL, Rfl: 0 .  NOVOLOG FLEXPEN 100 UNIT/ML FlexPen, INJECT SUBCUTANEOUSLY 12 UNITS BEFORE BREAKFAST; 14 UNITS AT LUNCH; AND 16 UNITS AT DINNER., Disp: 15 mL, Rfl: 3 .  spironolactone (ALDACTONE) 25 MG tablet, Take 25 mg by mouth every morning. , Disp: , Rfl:  .  traMADol-acetaminophen (ULTRACET) 37.5-325 MG tablet, Take 1 tablet by mouth every 6 (six) hours as needed. Reported on 06/15/2015, Disp: , Rfl:  .  VICTOZA 18 MG/3ML SOPN, INJECT 1.2MG  SUBCUTANEOUSLY DAILY AS DIRECTED., Disp: 6 mL, Rfl: 3 .  warfarin (COUMADIN) 5 MG tablet, Take 1 tablet (5 mg total) by mouth one time only at 6 PM. 1/2 Tablet Daily Except 1 Tablet on Mondays, Wednesdays and Fridays, Disp: 30 tablet, Rfl: 4  Review of Systems  Review of Systems  Constitutional: Negative for fever, chills, weight loss, malaise/fatigue and diaphoresis.  HENT: Negative for hearing loss, ear pain, nosebleeds, congestion, sore throat, neck pain, tinnitus and ear discharge.   Eyes: Negative for blurred vision, double vision, photophobia, pain, discharge and redness.  Respiratory: Negative for cough, hemoptysis, sputum production, shortness of breath, wheezing and stridor.   Cardiovascular: Negative for chest pain, palpitations, orthopnea, claudication, leg swelling and PND.  Gastrointestinal: negative for abdominal pain. Negative for heartburn, nausea, vomiting, diarrhea, constipation, blood in stool and melena.   Genitourinary: Negative for dysuria, urgency, frequency, hematuria and flank pain.  Musculoskeletal: Negative for myalgias, back pain, joint pain and falls.  Skin: Negative for itching and rash.  Neurological: Negative for dizziness, tingling, tremors, sensory change, speech change, focal weakness, seizures, loss of consciousness, weakness and headaches.  Endo/Heme/Allergies: Negative for environmental allergies and polydipsia. Does not bruise/bleed easily.  Psychiatric/Behavioral: Negative for depression, suicidal ideas, hallucinations, memory loss and substance abuse. The patient is not nervous/anxious and does not have insomnia.        Objective:  Blood pressure 110/70, pulse 72, height 5\' 4"  (1.626 m), weight 187 lb (84.8 kg).   Physical Exam  Vitals reviewed. Constitutional: She is oriented to person, place, and time. She appears well-developed and well-nourished.  HENT:  Head: Normocephalic and atraumatic.        Right Ear: External ear normal.  Left Ear: External ear normal.  Nose: Nose normal.  Mouth/Throat: Oropharynx is clear and moist.  Eyes: Conjunctivae and EOM are normal. Pupils are equal, round, and reactive to light. Right eye exhibits no discharge. Left eye exhibits no discharge. No scleral icterus.  Neck: Normal range of motion. Neck supple. No tracheal deviation present. No thyromegaly present.  Cardiovascular: Normal rate, regular rhythm, normal heart sounds and intact distal pulses.  Exam reveals no gallop and no friction rub.   No murmur heard. Respiratory: Effort normal and breath sounds normal. No respiratory distress. She has no wheezes. She has no rales. She exhibits no tenderness.  GI: Soft. Bowel sounds are normal. She exhibits no distension and no mass. There is no tenderness. There is no rebound and no guarding.  Genitourinary:  Breasts no masses skin changes or nipple changes bilaterally      Vulva is normal without lesions Vagina is pink moist without  discharge Cervix normal in appearance and pap is done Uterus is normal size shape and contour Adnexa is negative with normal sized ovaries  {Rectal    hemoccult negative, normal tone, no masses  Musculoskeletal: Normal range of motion. She exhibits no edema and no tenderness.  Neurological: She is alert and oriented to person, place, and time. She has normal reflexes. She displays normal reflexes. No cranial nerve deficit. She exhibits normal muscle tone. Coordination normal.  Skin: Skin is warm and dry. No rash noted. No erythema. No pallor.  Psychiatric: She has a normal mood and affect. Her behavior is normal. Judgment and thought content normal.       Medications Ordered at today's visit: Meds ordered this encounter  Medications  . calcitRIOL (ROCALTROL) 0.25 MCG capsule    Sig: Take 0.25 mcg by mouth daily.    Other orders placed at today's visit: No orders of the defined types were placed in this encounter.     Assessment:    Healthy female exam.    Plan:    Mammogram ordered. Follow up in: 1 year.     No Follow-up on file.

## 2016-01-08 ENCOUNTER — Other Ambulatory Visit: Payer: Self-pay | Admitting: Cardiovascular Disease

## 2016-01-08 LAB — CYTOLOGY - PAP: DIAGNOSIS: NEGATIVE

## 2016-01-09 ENCOUNTER — Ambulatory Visit (HOSPITAL_COMMUNITY): Admission: RE | Admit: 2016-01-09 | Payer: Medicaid Other | Source: Ambulatory Visit

## 2016-01-10 ENCOUNTER — Ambulatory Visit (HOSPITAL_COMMUNITY): Admission: RE | Admit: 2016-01-10 | Payer: Medicaid Other | Source: Ambulatory Visit

## 2016-01-12 ENCOUNTER — Ambulatory Visit (HOSPITAL_COMMUNITY)
Admission: RE | Admit: 2016-01-12 | Discharge: 2016-01-12 | Disposition: A | Discharge status: Home / Self Care | Payer: Medicaid Other | Source: Ambulatory Visit | Attending: Cardiovascular Disease | Admitting: Cardiovascular Disease

## 2016-01-12 DIAGNOSIS — M79604 Pain in right leg: Secondary | ICD-10-CM | POA: Insufficient documentation

## 2016-01-12 DIAGNOSIS — M79605 Pain in left leg: Secondary | ICD-10-CM | POA: Diagnosis present

## 2016-01-15 ENCOUNTER — Telehealth: Payer: Self-pay | Admitting: *Deleted

## 2016-01-15 NOTE — Telephone Encounter (Signed)
-----   Message from Josue Hector, MD sent at 01/14/2016  8:21 PM EST ----- Normal leg circulation

## 2016-01-15 NOTE — Telephone Encounter (Signed)
Called patient with test results. No answer. Left message to call back.  

## 2016-01-17 ENCOUNTER — Ambulatory Visit (INDEPENDENT_AMBULATORY_CARE_PROVIDER_SITE_OTHER): Payer: Medicaid Other | Admitting: Endocrinology

## 2016-01-17 ENCOUNTER — Encounter: Payer: Self-pay | Admitting: Endocrinology

## 2016-01-17 ENCOUNTER — Telehealth: Payer: Self-pay | Admitting: Pediatrics

## 2016-01-17 VITALS — BP 110/70 | HR 68 | Ht 64.0 in | Wt 187.0 lb

## 2016-01-17 DIAGNOSIS — Z794 Long term (current) use of insulin: Secondary | ICD-10-CM

## 2016-01-17 DIAGNOSIS — E1165 Type 2 diabetes mellitus with hyperglycemia: Secondary | ICD-10-CM | POA: Diagnosis not present

## 2016-01-17 LAB — POCT GLYCOSYLATED HEMOGLOBIN (HGB A1C): Hemoglobin A1C: 7.8

## 2016-01-17 MED ORDER — INSULIN DEGLUDEC 200 UNIT/ML ~~LOC~~ SOPN: 56.0000 [IU] | PEN_INJECTOR | Freq: Every day | SUBCUTANEOUS | 3 refills | Status: DC

## 2016-01-17 NOTE — Telephone Encounter (Signed)
PA approved until 01/11/2017.  PA# [id number]; I called [facility], they filled rx for patient.

## 2016-01-17 NOTE — Patient Instructions (Signed)
Tyler Aas replaces Lantus  Walk daily  Adjust Novolog at meals based on meal size and Carb amount  More sugars after meals

## 2016-01-17 NOTE — Progress Notes (Signed)
Patient ID: [name], female   DOB: 1960/03/02, 55 y.o.   MRN: [id number]   Reason for Appointment : Follow up for Type 1 Diabetes  History of Present Illness           Diagnosis: Type 2 diabetes mellitus, date of diagnosis: 1990        Past history: She has been on insulin since 2007. Generally has had poor control in requiring large doses of insulin. Blood sugars also have been previously variable making her control difficult. She has had difficulty following instructions for mealtime insulin consistently in the past  Has been on a multiple injection regimen of Lantus twice a day, NovoLog a.c. and NPH at bedtime A1c has been as high as 11.3 previously    Recent history: INSULIN regimen is described as: Lantus a.m. 30 - 26 in PM. Humulin N 24 hs, NovoLog a.c.12- 14-16 before meals  She has somewhat better controlled with A1c below 8% now usually has been about 8.6  Glucose patterns , current management and problems identified:  She was told to switch Lantus to [place] on her last visit but she still has not done so  Again has checked her blood sugars mostly in the morning and only a few readings in the afternoons and evenings  Difficult to know which readings in the evenings are before or after her evening meal but they are mostly high  She says her diet is quite variable in the evening but she does not adjust her mealtime doses based on what she is eating as discussed before  FASTING blood sugars are recently fairly good with sporadic readings over 200 when she forgets to take her bedtime NPH and Lantus  Blood sugars are only mildly increased midday and afternoon but not consistent again  Has only one low blood sugar of 67 at bedtime  She is trying to walk but not very consistently  Has been taking her Victoza reportedly everyday  Glucose monitoring:  done  1-2 times a day         Glucometer:  Accu-Chek [employer]iva       Blood Glucose readings from meter  download:   Mean values apply above for all meters except median for One Touch  PRE-MEAL Fasting 2 PM  6-9 pm Bedtime Overall  Glucose range: 83-254  114-206  108-287  6 7-205    Mean/median: 146  184  155+/-55    Physical activity: exercise: Walking 2/7  Self-care: The diet that the patient has been following is low fat,  Lunch : Sometimes salad otherwise sandwiches  Meals: 3 meals per day.  breakfast is at lunch 12 pm Supper at about 6-7 PM, mealtimes are variable           Dietician visit: Most recent:.Several years ago Last CD consult in 10/2013            Wt Readings from Last 3 Encounters:  01/17/16 187 lb (84.8 kg)  01/05/16 187 lb (84.8 kg)  10/24/15 185 lb (83.9 kg)   A1c results  Lab Results  Component Value Date   HGBA1C 7.8 01/17/2016   HGBA1C 8.6 10/25/2015   HGBA1C 8.6 10/24/2015   Lab Results  Component Value Date   MICROALBUR 1.9 10/14/2014   LDLCALC 93 01/23/2015   CREATININE 2.46 (H) 09/22/2015     Lab Results  Component Value Date   MICROALBUR 1.9 10/14/2014       Medication List  Accurate as of 01/17/16 12:50 PM. Always use your most recent med list.          acetaminophen 500 MG tablet Commonly known as:  TYLENOL Take 500 mg by mouth every 6 (six) hours as needed for moderate pain.   allopurinol 100 MG tablet Commonly known as:  ZYLOPRIM Take 150 mg by mouth every morning. Reported on 06/15/2015   benazepril 20 MG tablet Commonly known as:  LOTENSIN Take 10 mg by mouth every morning. Takes 1/2 tablet   calcitRIOL 0.25 MCG capsule Commonly known as:  ROCALTROL Take 0.25 mcg by mouth daily.   COLCRYS 0.6 MG tablet Generic drug:  colchicine Take 0.6 mg by mouth daily as needed (gout flare). Reported on 06/15/2015   COREG CR 40 MG 24 hr capsule Generic drug:  carvedilol TAKE ONE CAPSULE BY MOUTH DAILY.   furosemide 80 MG tablet Commonly known as:  LASIX TAKE (1) TABLET BY MOUTH TWICE DAILY.   HUMULIN N KWIKPEN 100  UNIT/ML Kiwkpen Generic drug:  Insulin NPH (Human) ([place]) INJECT 34 UNITS SUBCUTANEOUSLY AT BEDTIME.   Insulin Degludec 200 UNIT/ML Sopn Commonly known as:  TRESIBA FLEXTOUCH Inject 56 Units into the skin daily with breakfast.   LANOXIN 0.125 MG tablet Generic drug:  digoxin TAKE 1 TABLET BY MOUTH DAILY EXCEPT ON SATURDAY AND SUNDAY.   NOVOLOG FLEXPEN 100 UNIT/ML FlexPen Generic drug:  insulin aspart INJECT SUBCUTANEOUSLY 12 UNITS BEFORE BREAKFAST; 14 UNITS AT LUNCH; AND 16 UNITS AT DINNER.   spironolactone 25 MG tablet Commonly known as:  ALDACTONE Take 25 mg by mouth every morning.   traMADol-acetaminophen 37.5-325 MG tablet Commonly known as:  ULTRACET Take 1 tablet by mouth every 6 (six) hours as needed. Reported on 06/15/2015   VICTOZA 18 MG/3ML Sopn Generic drug:  liraglutide INJECT 1.2MG  SUBCUTANEOUSLY DAILY AS DIRECTED.   warfarin 5 MG tablet Commonly known as:  COUMADIN Take 1/2 tablet daily except 1 tablet on Mondays and Fridays       Allergies:  Allergies  Allergen Reactions  . Wheat Swelling  . Latex Rash  . Penicillins Rash  . Sulfa Antibiotics Rash    Past Medical History:  Diagnosis Date  . Automatic implantable cardioverter-defibrillator in situ    a. 03/2013: BSX Energen CRTD BiV ICC, ser# [id number]  . Chronic anticoagulation    a. coumadin.  . Chronic atrial fibrillation (HCC)    embolic rind  . Chronic kidney disease    creatinine- 1.8 in 09/2006 and 2.0 in 05/2007; 2.05 in 2011, 2.29 in 2012  . Chronic systolic CHF (congestive heart failure) ([place])    a. 03/2013 Echo: Sev LV dysfxn with sev diff HK, restrictive phys, diast dysfxn, mild MR, sev dil LA/RA, mod PR, PASP 76mmHg.  . Congenital third degree heart block    a. Guidant VVI pacemaker implanted in 09/1997; b. gen change in 01/2004;  c. 03/2013 upgrade to [employer], ser# [id number].  . Dizziness and giddiness 05/19/2012  . Dysphagia 08/14/2011   FEB 2013 EGD/DIL 16 MM; GERD; h/o  gastroesophageal reflux disease; + H. Pylori gastritis   . GERD (gastroesophageal reflux disease)   . Helicobacter pylori gastritis 08/14/2011  . IDDM (insulin dependent diabetes mellitus) ([place])   . Kidney stones 1990's  . Nonischemic cardiomyopathy ([place])    a. 03/2013 Echo: Sev LV dysfxn with sev diff HK, restrictive phys, diast dysfxn, mild MR, sev dil LA/RA, mod PR, PASP 51mmHg;  b. 03/2013: BSX Energen CRTD BiV ICC, ser#  [id number].  Marland Kitchen Stroke [facility]) 1999   denies residual on 04/21/2013    Past Surgical History:  Procedure Laterality Date  . BI-VENTRICULAR IMPLANTABLE CARDIOVERTER DEFIBRILLATOR  (CRT-D)  04/21/2013  . BI-VENTRICULAR IMPLANTABLE CARDIOVERTER DEFIBRILLATOR UPGRADE N/A 04/21/2013   Procedure: BI-VENTRICULAR IMPLANTABLE CARDIOVERTER DEFIBRILLATOR UPGRADE;  Surgeon: Evans Lance, MD;  Location: [facility] CATH LAB;  Service: Cardiovascular;  Laterality: N/A;  . COLONOSCOPY  04/23/2011   MBT:DHRCBULAGT COLON POLYP/ INTERNAL HEMORRHOIDS/ TORTUOUS COLON  . INSERT / REPLACE / REMOVE PACEMAKER  09/1997   Guidant VVI [employer]  . PACEMAKER GENERATOR CHANGE  01/2004  . UPPER GASTROINTESTINAL ENDOSCOPY  FEB 2013   RING/DIL TO 16 MM, H PYLORI GASTRITIS    Family History  Problem Relation Age of Onset  . Adopted: Yes  . Colon cancer Neg Hx     Social History:  reports that she has never smoked. She has never used smokeless tobacco. She reports that she does not drink alcohol or use drugs.    Review of Systems:   She has had stable chronic renal insufficiency, followed by nephrologist. Etiology unclear. No history of microalbuminuria   Lab Results  Component Value Date   CREATININE 2.46 (H) 09/22/2015   BUN 67 (H) 09/22/2015   NA 135 09/22/2015   K 4.4 09/22/2015   CL 101 09/22/2015   CO2 26 09/22/2015    She has had cardiomyopathy and atrial fibrillation followed by cardiologist  Lipids: Has dyslipidemia with low HDL, high triglycerides, not on any treatment  Lab  Results  Component Value Date   CHOL 131 09/22/2015   HDL 23.30 (L) 09/22/2015   LDLCALC 93 01/23/2015   LDLDIRECT 65.0 09/22/2015   TRIG 247.0 (H) 09/22/2015   CHOLHDL 6 09/22/2015    Foot exam in 11/16 normal except absent pedal pulses on the right  She thinks she had vascular studies done by her cardiologist and reportedly normal   Physical Examination:  BP 110/70   Pulse 68   Ht 5\' 4"  (1.626 m)   Wt 187 lb (84.8 kg)   BMI 32.10 kg/m       No ankle edema present   ASSESSMENT:  Diabetes type 1:  See history of present illness for detailed discussion of her current blood sugar patterns, treatment regimen, meal planning and problems identified  Her blood sugars are overall inconsistently controlled again Still requiring large doses of insulin especially basal Although A1c is better at 7.8 she has mostly high postprandial readings She has been inconsistent diet and is to be able to adjust her mealtime doses based on carbohydrate intake and meal size which she is not comfortable doing Also has been inconsistent with exercise regimen.   PLAN:     She will start taking Tresiba instead of Lantus for more consistent control and better compliance especially with once a day dose in the morning  Have given her a flowsheet and explained how to titrate that [place] every 3 days by 2 units until morning sugars are consistently below 130.  She will start with the basic doses of 46 units once a day  For now will continue NPH at bedtime would try to take it earlier at night so she does not forget  Consultation with dietitian  Consider carbohydrate counting and carbohydrate coverage of at least 1:10  Emphasized the need to check more sugars after meals  Restart regular exercise  Follow-up in 6 weeks  Patient Instructions  Tyler Aas replaces Lantus  Walk daily  Adjust Novolog at meals based on meal size and Carb amount  More sugars after meals     Counseling time on  subjects discussed above is over 50% of today's 25 minute visit    Eusevio Schriver 01/17/2016, 12:50 PM

## 2016-01-22 ENCOUNTER — Ambulatory Visit (INDEPENDENT_AMBULATORY_CARE_PROVIDER_SITE_OTHER): Payer: Medicaid Other | Admitting: *Deleted

## 2016-01-22 ENCOUNTER — Other Ambulatory Visit: Payer: Self-pay | Admitting: Endocrinology

## 2016-01-22 DIAGNOSIS — I428 Other cardiomyopathies: Secondary | ICD-10-CM | POA: Diagnosis not present

## 2016-01-22 NOTE — Progress Notes (Signed)
Remote ICD transmission.   

## 2016-01-24 ENCOUNTER — Ambulatory Visit (INDEPENDENT_AMBULATORY_CARE_PROVIDER_SITE_OTHER): Payer: Medicaid Other | Admitting: *Deleted

## 2016-01-24 DIAGNOSIS — Z5181 Encounter for therapeutic drug level monitoring: Secondary | ICD-10-CM | POA: Diagnosis not present

## 2016-01-24 DIAGNOSIS — I4891 Unspecified atrial fibrillation: Secondary | ICD-10-CM | POA: Diagnosis not present

## 2016-01-24 LAB — POCT INR: INR: 2.7

## 2016-01-25 ENCOUNTER — Encounter: Payer: Self-pay | Admitting: Cardiology

## 2016-02-09 ENCOUNTER — Encounter: Payer: Self-pay | Admitting: Cardiology

## 2016-02-12 ENCOUNTER — Telehealth: Payer: Self-pay | Admitting: Endocrinology

## 2016-02-12 NOTE — Telephone Encounter (Signed)
Pharmacy [facility]  sent request back last week and haven't heard anything back. Patient need a PA for medication   VICTOZA 18 MG/3ML SOPN  It's been out two days.  Today please

## 2016-02-13 NOTE — Telephone Encounter (Signed)
Spoke with pateint and PA has been initiated and will be 24 to 48 hours before we get a answer

## 2016-02-15 ENCOUNTER — Ambulatory Visit (INDEPENDENT_AMBULATORY_CARE_PROVIDER_SITE_OTHER): Payer: Medicaid Other | Admitting: *Deleted

## 2016-02-15 DIAGNOSIS — I4891 Unspecified atrial fibrillation: Secondary | ICD-10-CM

## 2016-02-15 DIAGNOSIS — Z5181 Encounter for therapeutic drug level monitoring: Secondary | ICD-10-CM

## 2016-02-15 LAB — POCT INR: INR: 2.5

## 2016-02-21 ENCOUNTER — Encounter: Payer: Self-pay | Admitting: *Deleted

## 2016-02-22 ENCOUNTER — Other Ambulatory Visit: Payer: Self-pay | Admitting: Internal Medicine

## 2016-02-27 LAB — CUP PACEART REMOTE DEVICE CHECK
Battery Remaining Longevity: 90 mo
HighPow Impedance: 71 Ohm
Implantable Lead Implant Date: 20150225
Implantable Lead Location: 753858
Implantable Lead Model: 180
Lead Channel Impedance Value: 426 Ohm
Lead Channel Impedance Value: 508 Ohm
Lead Channel Pacing Threshold Amplitude: 0.3 V
Lead Channel Pacing Threshold Amplitude: 0.5 V
Lead Channel Setting Pacing Amplitude: 2.4 V
Lead Channel Setting Pacing Pulse Width: 0.5 ms
Lead Channel Setting Pacing Pulse Width: 0.5 ms
Lead Channel Setting Sensing Sensitivity: 1 mV
MDC IDC LEAD IMPLANT DT: 20150225
MDC IDC LEAD LOCATION: 753860
MDC IDC LEAD SERIAL: 311545
MDC IDC MSMT BATTERY REMAINING PERCENTAGE: 100 %
MDC IDC MSMT LEADCHNL LV PACING THRESHOLD PULSEWIDTH: 0.5 ms
MDC IDC MSMT LEADCHNL RV PACING THRESHOLD PULSEWIDTH: 0.5 ms
MDC IDC PG IMPLANT DT: 20150225
MDC IDC PG SERIAL: 114945
MDC IDC SESS DTM: 20171127070300
MDC IDC SET LEADCHNL LV PACING AMPLITUDE: 2.4 V
MDC IDC SET LEADCHNL RV SENSING SENSITIVITY: 0.6 mV
MDC IDC STAT BRADY RA PERCENT PACED: 0 %
MDC IDC STAT BRADY RV PERCENT PACED: 98 %

## 2016-03-11 ENCOUNTER — Encounter: Payer: Self-pay | Admitting: *Deleted

## 2016-03-13 ENCOUNTER — Ambulatory Visit (HOSPITAL_COMMUNITY): Payer: Medicaid Other

## 2016-03-15 ENCOUNTER — Ambulatory Visit (INDEPENDENT_AMBULATORY_CARE_PROVIDER_SITE_OTHER): Payer: Medicaid Other | Admitting: *Deleted

## 2016-03-15 DIAGNOSIS — I4891 Unspecified atrial fibrillation: Secondary | ICD-10-CM | POA: Diagnosis not present

## 2016-03-15 DIAGNOSIS — Z5181 Encounter for therapeutic drug level monitoring: Secondary | ICD-10-CM

## 2016-03-15 LAB — POCT INR: INR: 3.1

## 2016-03-19 ENCOUNTER — Other Ambulatory Visit: Payer: Self-pay | Admitting: Endocrinology

## 2016-03-22 ENCOUNTER — Ambulatory Visit (INDEPENDENT_AMBULATORY_CARE_PROVIDER_SITE_OTHER): Payer: Medicaid Other | Admitting: Endocrinology

## 2016-03-22 ENCOUNTER — Encounter: Payer: Self-pay | Admitting: Endocrinology

## 2016-03-22 VITALS — BP 120/74 | HR 66 | Ht 64.0 in | Wt 185.0 lb

## 2016-03-22 DIAGNOSIS — E1165 Type 2 diabetes mellitus with hyperglycemia: Secondary | ICD-10-CM | POA: Diagnosis not present

## 2016-03-22 DIAGNOSIS — Z794 Long term (current) use of insulin: Secondary | ICD-10-CM | POA: Diagnosis not present

## 2016-03-22 NOTE — Progress Notes (Signed)
Patient ID: [name], female   DOB: 12/13/1960, 56 y.o.   MRN: [id number]   Reason for Appointment : Follow up for Type 1 Diabetes  History of Present Illness           Diagnosis: Type 2 diabetes mellitus, date of diagnosis: 1990        Past history: She has been on insulin since 2007. Generally has had poor control in requiring large doses of insulin. Blood sugars also have been previously variable making her control difficult. She has had difficulty following instructions for mealtime insulin consistently in the past  Has been on a multiple injection regimen of Lantus twice a day, NovoLog a.c. and NPH at bedtime A1c has been as high as 11.3 previously   Recent history: INSULIN regimen is described as: Tresiba 45-50 Humulin N 34 hs, NovoLog a.c.12- 14-16 before meals  She has somewhat better controlled with A1c below 8% now usually has been about 8.6  Glucose patterns , current management and problems identified:  She was switched from Lantus twice a day to [place] to help with more consistent control and variability  However her blood sugars are still showing the same pattern.  She thinks that she is not trying to focus on taking care of her diabetes consistently  Although she was told to adjust to [place] basically to get her fasting readings down she is arbitrarily taking between 45-50 units with no pattern.  She is apparently now admitting to be noncompliant with her lunchtime insulin especially when she is eating out  Blood sugars are being checked mostly in the mornings and early afternoon and usually no other times  Not clear what her postprandial readings are in the evening  She was also told to adjust her NOVOLOG based on meal size but she is not able to do this much and may occasionally have a low  reading at bedtime  She says her diet is quite variable in the evening and also at lunch and sometimes she may not eat as much  FASTING blood sugars are  inconsistent, has had one early morning low blood sugar also  She thinks she is compliant with her BEDTIME NPH daily  She is trying to walk but not very often recently  Has been taking her Victoza as directed  Glucose monitoring:  done  1-2 times a day         Glucometer:  Accu-Chek Aviva       Blood Glucose readings from meter download:   Mean values apply above for all meters except median for One Touch  PRE-MEAL Fasting Lunch Afternoon  Bedtime Overall  Glucose range: 58-224  111-346  58-223    Mean/median: 143 112 234 140 153     Physical activity: exercise: Walking some   Self-care: The diet that the patient has been following is low fat,  Lunch : Sometimes salad otherwise sandwiches  Meals: 3 meals per day.  breakfast is at lunch 12 pm Supper at about 6-7 PM, mealtimes are variable           Dietician visit: Most recent:.Several years ago Last CD consult in 10/2013            Wt Readings from Last 3 Encounters:  03/22/16 185 lb (83.9 kg)  01/17/16 187 lb (84.8 kg)  01/05/16 187 lb (84.8 kg)   A1c results  Lab Results  Component Value Date   HGBA1C 7.8 01/17/2016   HGBA1C 8.6 10/25/2015  HGBA1C 8.6 10/24/2015   Lab Results  Component Value Date   MICROALBUR 1.9 10/14/2014   LDLCALC 93 01/23/2015   CREATININE 2.46 (H) 09/22/2015     Lab Results  Component Value Date   MICROALBUR 1.9 10/14/2014     Allergies as of 03/22/2016      Reactions   Wheat Swelling   Latex Rash   Penicillins Rash   Sulfa Antibiotics Rash      Medication List       Accurate as of 03/22/16 11:59 PM. Always use your most recent med list.          acetaminophen 500 MG tablet Commonly known as:  TYLENOL Take 500 mg by mouth every 6 (six) hours as needed for moderate pain.   allopurinol 100 MG tablet Commonly known as:  ZYLOPRIM Take 150 mg by mouth every morning. Reported on 06/15/2015   benazepril 20 MG tablet Commonly known as:  LOTENSIN Take 10 mg by mouth every  morning. Takes 1/2 tablet   calcitRIOL 0.25 MCG capsule Commonly known as:  ROCALTROL Take 0.25 mcg by mouth daily.   COLCRYS 0.6 MG tablet Generic drug:  colchicine Take 0.6 mg by mouth daily as needed (gout flare). Reported on 06/15/2015   COREG CR 40 MG 24 hr capsule Generic drug:  carvedilol TAKE ONE CAPSULE BY MOUTH DAILY.   furosemide 80 MG tablet Commonly known as:  LASIX TAKE (1) TABLET BY MOUTH TWICE DAILY.   HUMULIN N KWIKPEN 100 UNIT/ML Kiwkpen Generic drug:  Insulin NPH (Human) ([place]) INJECT 34 UNITS SUBCUTANEOUSLY AT BEDTIME.   Insulin Degludec 200 UNIT/ML Sopn Commonly known as:  TRESIBA FLEXTOUCH Inject 56 Units into the skin daily with breakfast.   LANOXIN 0.125 MG tablet Generic drug:  digoxin TAKE 1 TABLET BY MOUTH DAILY EXCEPT ON SATURDAY AND SUNDAY.   NOVOLOG FLEXPEN 100 UNIT/ML FlexPen Generic drug:  insulin aspart INJECT SUBCUTANEOUSLY 12 UNITS BEFORE BREAKFAST; 14 UNITS AT LUNCH; AND 16 UNITS AT DINNER.   spironolactone 25 MG tablet Commonly known as:  ALDACTONE Take 25 mg by mouth every morning.   traMADol-acetaminophen 37.5-325 MG tablet Commonly known as:  ULTRACET Take 1 tablet by mouth every 6 (six) hours as needed. Reported on 06/15/2015   VICTOZA 18 MG/3ML Sopn Generic drug:  liraglutide INJECT 1.2MG  SUBCUTANEOUSLY DAILY AS DIRECTED.   warfarin 5 MG tablet Commonly known as:  COUMADIN Take 1/2 tablet daily except 1 tablet on Mondays and Fridays       Allergies:  Allergies  Allergen Reactions  . Wheat Swelling  . Latex Rash  . Penicillins Rash  . Sulfa Antibiotics Rash    Past Medical History:  Diagnosis Date  . Automatic implantable cardioverter-defibrillator in situ    a. 03/2013: BSX Energen CRTD BiV ICC, ser# [id number]  . Chronic anticoagulation    a. coumadin.  . Chronic atrial fibrillation (HCC)    embolic rind  . Chronic kidney disease    creatinine- 1.8 in 09/2006 and 2.0 in 05/2007; 2.05 in 2011, 2.29 in 2012  .  Chronic systolic CHF (congestive heart failure) ([place])    a. 03/2013 Echo: Sev LV dysfxn with sev diff HK, restrictive phys, diast dysfxn, mild MR, sev dil LA/RA, mod PR, PASP 47mmHg.  . Congenital third degree heart block    a. Guidant VVI pacemaker implanted in 09/1997; b. gen change in 01/2004;  c. 03/2013 upgrade to [place], ser# [id number].  . Dizziness and giddiness 05/19/2012  .  Dysphagia 08/14/2011   FEB 2013 EGD/DIL 16 MM; GERD; h/o gastroesophageal reflux disease; + H. Pylori gastritis   . GERD (gastroesophageal reflux disease)   . Helicobacter pylori gastritis 08/14/2011  . IDDM (insulin dependent diabetes mellitus) ([place])   . Kidney stones 1990's  . Nonischemic cardiomyopathy ([place])    a. 03/2013 Echo: Sev LV dysfxn with sev diff HK, restrictive phys, diast dysfxn, mild MR, sev dil LA/RA, mod PR, PASP 50mmHg;  b. 03/2013: BSX Energen CRTD BiV ICC, ser# [id number].  Marland Kitchen Stroke [facility]) 1999   denies residual on 04/21/2013    Past Surgical History:  Procedure Laterality Date  . BI-VENTRICULAR IMPLANTABLE CARDIOVERTER DEFIBRILLATOR  (CRT-D)  04/21/2013  . BI-VENTRICULAR IMPLANTABLE CARDIOVERTER DEFIBRILLATOR UPGRADE N/A 04/21/2013   Procedure: BI-VENTRICULAR IMPLANTABLE CARDIOVERTER DEFIBRILLATOR UPGRADE;  Surgeon: Evans Lance, MD;  Location: [facility] CATH LAB;  Service: Cardiovascular;  Laterality: N/A;  . COLONOSCOPY  04/23/2011   DGL:OVFIEPPIRJ COLON POLYP/ INTERNAL HEMORRHOIDS/ TORTUOUS COLON  . INSERT / REPLACE / REMOVE PACEMAKER  09/1997   Guidant VVI [employer]  . PACEMAKER GENERATOR CHANGE  01/2004  . UPPER GASTROINTESTINAL ENDOSCOPY  FEB 2013   RING/DIL TO 16 MM, H PYLORI GASTRITIS    Family History  Problem Relation Age of Onset  . Adopted: Yes  . Colon cancer Neg Hx     Social History:  reports that she has never smoked. She has never used smokeless tobacco. She reports that she does not drink alcohol or use drugs.    Review of Systems:   She has had stable  chronic renal insufficiency, followed by nephrologist.  On calcitriol for secondary hyperparathyroidism No history of microalbuminuria   Lab Results  Component Value Date   CREATININE 2.46 (H) 09/22/2015   BUN 67 (H) 09/22/2015   NA 135 09/22/2015   K 4.4 09/22/2015   CL 101 09/22/2015   CO2 26 09/22/2015    She has had cardiomyopathy and History of atrial fibrillation followed by cardiologist  Lipids: Has dyslipidemia with low HDL, high triglycerides, not on any treatment  Lab Results  Component Value Date   CHOL 131 09/22/2015   HDL 23.30 (L) 09/22/2015   LDLCALC 93 01/23/2015   LDLDIRECT 65.0 09/22/2015   TRIG 247.0 (H) 09/22/2015   CHOLHDL 6 09/22/2015    Foot exam in 11/16 normal except absent pedal pulses on the right  She thinks she had vascular studies done by her cardiologist and reportedly normal   Physical Examination:  BP 120/74   Pulse 66   Ht 5\' 4"  (1.626 m)   Wt 185 lb (83.9 kg)   SpO2 96%   BMI 31.76 kg/m       No ankle edema present  Diabetic Foot Exam - Simple   Simple Foot Form Diabetic Foot exam was performed with the following findings:  Yes 03/22/2016 11:03 AM  Visual Inspection No deformities, no ulcerations, no other skin breakdown bilaterally:  Yes Sensation Testing Intact to touch and monofilament testing bilaterally:  Yes Pulse Check See comments:  Yes Comments Absent right pedal pulses     ASSESSMENT:  Diabetes type 1:  See history of present illness for detailed discussion of her current blood sugar patterns, treatment regimen, meal planning and problems identified  Her blood sugars are overall inconsistently controlled  Even to [place] now higher fasting readings are not consistent She has difficulty being focused on taking her mealtime insulin consistently and also not adjusting based on carbohydrate intake She has highest readings  in the afternoon and some of these are related to eating out Has not had a consultation  with dietitian in a while and did not make an appointment as recommended on the last visit  Still requiring large doses of insulin especially basal  Also not taking consistent amounts of Tresiba and not adjusting based on fasting blood sugar patterns Despite being given a flowsheet She was presumed to be taking 24 units of NPH at bedtime but she thinks she is taking 34  Although A1c on the last visit was 7.8 needs follow-up   PLAN:     She will start taking a stable amount of Tresiba starting with 46 units for now   Reduce NPH to 30 units at bedtime to avoid overnight hypoglycemia  She needs to take her NovoLog very consistently when eating out  She will see the dietitian and would consider basing her NovoLog with carbohydrate counting ratios, possibly starting with 1:10  Advised her on the need to check more sugars after meals especially after supper  Restart regular walking  Follow-up in 6 weeks  Patient Instructions  Tresiba 46 daily   Humulin N take 30 units at nite  Must take Novolog before the meal when eating out       Counseling time on subjects discussed above is over 50% of today's 25 minute visit    Ashby Moskal 03/23/2016, 5:53 PM

## 2016-03-22 NOTE — Patient Instructions (Addendum)
Tresiba 46 daily   Humulin N take 30 units at nite  Must take Novolog before the meal when eating out

## 2016-04-02 ENCOUNTER — Encounter: Payer: Self-pay | Admitting: Endocrinology

## 2016-04-04 ENCOUNTER — Ambulatory Visit (HOSPITAL_COMMUNITY)
Admission: RE | Admit: 2016-04-04 | Discharge: 2016-04-04 | Disposition: A | Discharge status: Home / Self Care | Payer: Medicaid Other | Source: Ambulatory Visit | Attending: Obstetrics & Gynecology | Admitting: Obstetrics & Gynecology

## 2016-04-04 DIAGNOSIS — R928 Other abnormal and inconclusive findings on diagnostic imaging of breast: Secondary | ICD-10-CM | POA: Insufficient documentation

## 2016-04-04 DIAGNOSIS — Z1231 Encounter for screening mammogram for malignant neoplasm of breast: Secondary | ICD-10-CM | POA: Insufficient documentation

## 2016-04-08 ENCOUNTER — Other Ambulatory Visit: Payer: Self-pay | Admitting: Obstetrics & Gynecology

## 2016-04-08 DIAGNOSIS — N6489 Other specified disorders of breast: Secondary | ICD-10-CM

## 2016-04-12 ENCOUNTER — Encounter: Payer: Medicaid Other | Admitting: Dietician

## 2016-04-15 ENCOUNTER — Ambulatory Visit (INDEPENDENT_AMBULATORY_CARE_PROVIDER_SITE_OTHER): Payer: Medicaid Other | Admitting: *Deleted

## 2016-04-15 DIAGNOSIS — I4891 Unspecified atrial fibrillation: Secondary | ICD-10-CM | POA: Diagnosis not present

## 2016-04-15 DIAGNOSIS — Z5181 Encounter for therapeutic drug level monitoring: Secondary | ICD-10-CM | POA: Diagnosis not present

## 2016-04-15 LAB — POCT INR: INR: 2.3

## 2016-04-17 ENCOUNTER — Telehealth: Payer: Self-pay | Admitting: Endocrinology

## 2016-04-17 ENCOUNTER — Other Ambulatory Visit: Payer: Self-pay | Admitting: Endocrinology

## 2016-04-17 NOTE — Telephone Encounter (Signed)
Patient need medication victoza and more refills  [place], [place]

## 2016-04-18 ENCOUNTER — Other Ambulatory Visit: Payer: Self-pay

## 2016-04-18 MED ORDER — LIRAGLUTIDE 18 MG/3ML ~~LOC~~ SOPN: PEN_INJECTOR | SUBCUTANEOUS | 0 refills | Status: DC

## 2016-04-18 NOTE — Telephone Encounter (Signed)
Ordered

## 2016-04-22 ENCOUNTER — Ambulatory Visit (INDEPENDENT_AMBULATORY_CARE_PROVIDER_SITE_OTHER): Payer: Medicaid Other | Admitting: *Deleted

## 2016-04-22 DIAGNOSIS — I428 Other cardiomyopathies: Secondary | ICD-10-CM

## 2016-04-22 NOTE — Progress Notes (Signed)
Remote ICD transmission.   

## 2016-04-23 ENCOUNTER — Encounter: Payer: Self-pay | Admitting: Cardiology

## 2016-04-23 ENCOUNTER — Ambulatory Visit
Admission: RE | Admit: 2016-04-23 | Discharge: 2016-04-23 | Disposition: A | Discharge status: Home / Self Care | Payer: Medicaid Other | Source: Ambulatory Visit | Attending: Obstetrics & Gynecology | Admitting: Obstetrics & Gynecology

## 2016-04-23 ENCOUNTER — Encounter (HOSPITAL_COMMUNITY): Payer: Medicaid Other

## 2016-04-23 DIAGNOSIS — N6489 Other specified disorders of breast: Secondary | ICD-10-CM

## 2016-04-23 LAB — CUP PACEART REMOTE DEVICE CHECK
Battery Remaining Longevity: 84 mo
Battery Remaining Percentage: 100 %
Brady Statistic RA Percent Paced: 0 %
Brady Statistic RV Percent Paced: 98 %
Date Time Interrogation Session: 20180226070500
HIGH POWER IMPEDANCE MEASURED VALUE: 71 Ohm
Implantable Lead Implant Date: 20150225
Implantable Lead Location: 753858
Implantable Lead Location: 753860
Implantable Lead Serial Number: 311545
Lead Channel Pacing Threshold Amplitude: 0.3 V
Lead Channel Pacing Threshold Pulse Width: 0.5 ms
Lead Channel Setting Pacing Amplitude: 2.4 V
Lead Channel Setting Pacing Amplitude: 2.4 V
Lead Channel Setting Pacing Pulse Width: 0.5 ms
Lead Channel Setting Pacing Pulse Width: 0.5 ms
Lead Channel Setting Sensing Sensitivity: 0.6 mV
Lead Channel Setting Sensing Sensitivity: 1 mV
MDC IDC LEAD IMPLANT DT: 20150225
MDC IDC MSMT LEADCHNL LV IMPEDANCE VALUE: 407 Ohm
MDC IDC MSMT LEADCHNL RV IMPEDANCE VALUE: 501 Ohm
MDC IDC MSMT LEADCHNL RV PACING THRESHOLD AMPLITUDE: 0.5 V
MDC IDC MSMT LEADCHNL RV PACING THRESHOLD PULSEWIDTH: 0.5 ms
MDC IDC PG IMPLANT DT: 20150225
Pulse Gen Serial Number: 114945

## 2016-04-25 ENCOUNTER — Encounter: Payer: Self-pay | Admitting: Gastroenterology

## 2016-04-26 ENCOUNTER — Other Ambulatory Visit: Payer: Self-pay | Admitting: Internal Medicine

## 2016-05-08 ENCOUNTER — Encounter: Payer: Self-pay | Admitting: Cardiology

## 2016-05-09 ENCOUNTER — Encounter: Payer: Medicaid Other | Admitting: Dietician

## 2016-05-14 ENCOUNTER — Other Ambulatory Visit: Payer: Self-pay | Admitting: Endocrinology

## 2016-05-15 ENCOUNTER — Ambulatory Visit (INDEPENDENT_AMBULATORY_CARE_PROVIDER_SITE_OTHER): Payer: Medicaid Other | Admitting: *Deleted

## 2016-05-15 DIAGNOSIS — I4891 Unspecified atrial fibrillation: Secondary | ICD-10-CM

## 2016-05-15 DIAGNOSIS — Z5181 Encounter for therapeutic drug level monitoring: Secondary | ICD-10-CM

## 2016-05-15 LAB — POCT INR: INR: 2.9

## 2016-05-17 ENCOUNTER — Encounter: Payer: Self-pay | Admitting: Cardiovascular Disease

## 2016-05-17 ENCOUNTER — Ambulatory Visit (INDEPENDENT_AMBULATORY_CARE_PROVIDER_SITE_OTHER): Payer: Medicaid Other | Admitting: Cardiovascular Disease

## 2016-05-17 VITALS — BP 118/72 | HR 67 | Ht 64.0 in | Wt 185.0 lb

## 2016-05-17 DIAGNOSIS — M79604 Pain in right leg: Secondary | ICD-10-CM | POA: Diagnosis not present

## 2016-05-17 DIAGNOSIS — I4891 Unspecified atrial fibrillation: Secondary | ICD-10-CM | POA: Diagnosis not present

## 2016-05-17 DIAGNOSIS — I482 Chronic atrial fibrillation, unspecified: Secondary | ICD-10-CM

## 2016-05-17 DIAGNOSIS — M79605 Pain in left leg: Secondary | ICD-10-CM

## 2016-05-17 DIAGNOSIS — Z95810 Presence of automatic (implantable) cardiac defibrillator: Secondary | ICD-10-CM

## 2016-05-17 DIAGNOSIS — I428 Other cardiomyopathies: Secondary | ICD-10-CM | POA: Diagnosis not present

## 2016-05-17 DIAGNOSIS — I5022 Chronic systolic (congestive) heart failure: Secondary | ICD-10-CM | POA: Diagnosis not present

## 2016-05-17 DIAGNOSIS — I10 Essential (primary) hypertension: Secondary | ICD-10-CM | POA: Diagnosis not present

## 2016-05-17 NOTE — Patient Instructions (Signed)
Your physician wants you to follow-up in: 1 year Dr Koneswaran You will receive a reminder letter in the mail two months in advance. If you don't receive a letter, please call our office to schedule the follow-up appointment.     Your physician recommends that you continue on your current medications as directed. Please refer to the Current Medication list given to you today.    If you need a refill on your cardiac medications before your next appointment, please call your pharmacy.      Thank you for choosing [facility] !         

## 2016-05-17 NOTE — Progress Notes (Signed)
SUBJECTIVE: The patient is a 56 year old woman with a history of congenital third degree heart block, atrial fibrillation, nonischemic cardiomyopathy s/p BiV ICD (04/21/13) with chronic systolic heart failure, and insulin-dependent diabetes mellitus.  Echocardiogram on 04/19/2014 demonstrated severely reduced left ventricular systolic function, EF 66-59%, biventricular dilatation, restrictive physiology, and moderate tricuspid regurgitation.  She is doing very well and denies chest pain, palpitations, leg swelling , orthopnea, paroxysmal nocturnal dyspnea, and shortness of breath.  She does have some right leg aching when she is up on her feet for long periods of time. She has varicose veins. Lower extremity arterial Dopplers on 01/12/16 were normal.  She continues to avoid sodium and tries to eat a healthy diet.   Review of Systems: As per "subjective", otherwise negative.  Allergies  Allergen Reactions  . Wheat Swelling  . Latex Rash  . Penicillins Rash  . Sulfa Antibiotics Rash    Current Outpatient Prescriptions  Medication Sig Dispense Refill  . acetaminophen (TYLENOL) 500 MG tablet Take 500 mg by mouth every 6 (six) hours as needed for moderate pain.    Marland Kitchen allopurinol (ZYLOPRIM) 100 MG tablet Take 150 mg by mouth every morning. Reported on 06/15/2015    . benazepril (LOTENSIN) 20 MG tablet Take 10 mg by mouth every morning. Takes 1/2 tablet    . calcitRIOL (ROCALTROL) 0.25 MCG capsule Take 0.25 mcg by mouth daily.    Marland Kitchen COLCRYS 0.6 MG tablet Take 0.6 mg by mouth daily as needed (gout flare). Reported on 06/15/2015    . COREG CR 40 MG 24 hr capsule TAKE ONE CAPSULE BY MOUTH DAILY. 30 capsule 11  . furosemide (LASIX) 80 MG tablet TAKE (1) TABLET BY MOUTH TWICE DAILY. 60 tablet 11  . HUMULIN N KWIKPEN 100 UNIT/ML Kiwkpen INJECT 34 UNITS SUBCUTANEOUSLY AT BEDTIME. 15 mL 3  . Insulin Degludec (TRESIBA FLEXTOUCH) 200 UNIT/ML SOPN Inject 56 Units into the skin daily with breakfast.  3 pen 3  . LANOXIN 125 MCG tablet TAKE 1 TABLET BY MOUTH DAILY EXCEPT ON SATURDAY AND SUNDAY. 60 tablet 0  . liraglutide (VICTOZA) 18 MG/3ML SOPN INJECT 1.2MG  SUBCUTANEOUSLY DAILY AS DIRECTED. 6 mL 0  . NOVOLOG FLEXPEN 100 UNIT/ML FlexPen INJECT SUBCUTANEOUSLY 12 UNITS BEFORE BREAKFAST; 14 UNITS AT LUNCH; AND 16 UNITS AT DINNER. 15 mL 0  . spironolactone (ALDACTONE) 25 MG tablet Take 25 mg by mouth every morning.     . traMADol-acetaminophen (ULTRACET) 37.5-325 MG tablet Take 1 tablet by mouth every 6 (six) hours as needed. Reported on 06/15/2015    . warfarin (COUMADIN) 5 MG tablet Take 1/2 tablet daily except 1 tablet on Mondays and Fridays 20 tablet 6   No current facility-administered medications for this visit.     Past Medical History:  Diagnosis Date  . Automatic implantable cardioverter-defibrillator in situ    a. 03/2013: BSX Energen CRTD BiV ICC, ser# [id number]  . Chronic anticoagulation    a. coumadin.  . Chronic atrial fibrillation (HCC)    embolic rind  . Chronic kidney disease    creatinine- 1.8 in 09/2006 and 2.0 in 05/2007; 2.05 in 2011, 2.29 in 2012  . Chronic systolic CHF (congestive heart failure) ([place])    a. 03/2013 Echo: Sev LV dysfxn with sev diff HK, restrictive phys, diast dysfxn, mild MR, sev dil LA/RA, mod PR, PASP 15mmHg.  . Congenital third degree heart block    a. Guidant VVI pacemaker implanted in 09/1997; b. gen change in 01/2004;  c.  03/2013 upgrade to [place], ser# [id number].  . Dizziness and giddiness 05/19/2012  . Dysphagia 08/14/2011   FEB 2013 EGD/DIL 16 MM; GERD; h/o gastroesophageal reflux disease; + H. Pylori gastritis   . GERD (gastroesophageal reflux disease)   . Helicobacter pylori gastritis 08/14/2011  . IDDM (insulin dependent diabetes mellitus) ([place])   . Kidney stones 1990's  . Nonischemic cardiomyopathy ([place])    a. 03/2013 Echo: Sev LV dysfxn with sev diff HK, restrictive phys, diast dysfxn, mild MR, sev dil LA/RA, mod PR, PASP 33mmHg;   b. 03/2013: BSX Energen CRTD BiV ICC, ser# [id number].  Marland Kitchen Stroke [facility]) 1999   denies residual on 04/21/2013    Past Surgical History:  Procedure Laterality Date  . BI-VENTRICULAR IMPLANTABLE CARDIOVERTER DEFIBRILLATOR  (CRT-D)  04/21/2013  . BI-VENTRICULAR IMPLANTABLE CARDIOVERTER DEFIBRILLATOR UPGRADE N/A 04/21/2013   Procedure: BI-VENTRICULAR IMPLANTABLE CARDIOVERTER DEFIBRILLATOR UPGRADE;  Surgeon: Evans Lance, MD;  Location: [facility] CATH LAB;  Service: Cardiovascular;  Laterality: N/A;  . COLONOSCOPY  04/23/2011   XVQ:[id number] COLON POLYP/ INTERNAL HEMORRHOIDS/ TORTUOUS COLON  . INSERT / REPLACE / REMOVE PACEMAKER  09/1997   Guidant VVI [employer]  . PACEMAKER GENERATOR CHANGE  01/2004  . UPPER GASTROINTESTINAL ENDOSCOPY  FEB 2013   RING/DIL TO 16 MM, H PYLORI GASTRITIS    Social History   Social History  . Marital status: Married    Spouse name: N/A  . Number of children: 4  . Years of education: N/A   Occupational History  .  Unemployed   Social History Main Topics  . Smoking status: Never Smoker  . Smokeless tobacco: Never Used     Comment: Never smoked  . Alcohol use No  . Drug use: No  . Sexual activity: Yes   Other Topics Concern  . Not on file   Social History Narrative  . No narrative on file     Vitals:   05/17/16 1142  BP: 118/72  Pulse: 67  SpO2: 96%  Weight: 185 lb (83.9 kg)  Height: 5\' 4"  (1.626 m)    PHYSICAL EXAM General: NAD  Neck: No JVD, no thyromegaly or thyroid nodule.  Lungs: Clear to auscultation bilaterally with normal respiratory effort.  CV: Nondisplaced PMI. Heart regular S1/split S2, no D3/O6, I/VI pansystolic murmur along left sternal border. No peripheral edema.  Abdomen: Soft, nontender, no distention.  Neurologic: Alert and oriented x 3.  Psych: Normal affect.  Extremities: No clubbing or cyanosis.  Skin: Normal. Musculoskeletal: Normal range of motion, no gross deformities.    ECG: Most recent ECG  reviewed.      ASSESSMENT AND PLAN:  1. Nonischemic cardiomyopathy/chronic systolic heart failure s/p BiV ICD: EF 15-20% on 04/19/14. Currently with NYHA class I symptoms. Continue current diuretic regimen with furosemide and spironolactone. Continue Coreg and digoxin.   2. Essential HTN: Well controlled on present therapy. No changes.  3. Atrial fibrillation: Heart rate is well controlled on digoxin and carvedilol. She maintains anticoagulation with warfarin and is without bleeding problems.  4. Congenital third-degree heart block with pacemaker: Stable and followed by Dr. Lovena Le.   5. Varicose veins with leg aching: I encouraged her to purchase compression stockings.  Dispo: f/u 1 year.  Kate Sable, M.D., F.A.C.C.

## 2016-05-20 ENCOUNTER — Encounter: Payer: Self-pay | Admitting: Endocrinology

## 2016-05-20 ENCOUNTER — Ambulatory Visit (INDEPENDENT_AMBULATORY_CARE_PROVIDER_SITE_OTHER): Payer: Medicaid Other | Admitting: Endocrinology

## 2016-05-20 VITALS — BP 112/70 | HR 64 | Ht 64.0 in | Wt 187.0 lb

## 2016-05-20 DIAGNOSIS — Z794 Long term (current) use of insulin: Secondary | ICD-10-CM

## 2016-05-20 DIAGNOSIS — N184 Chronic kidney disease, stage 4 (severe): Secondary | ICD-10-CM

## 2016-05-20 DIAGNOSIS — E1165 Type 2 diabetes mellitus with hyperglycemia: Secondary | ICD-10-CM | POA: Diagnosis not present

## 2016-05-20 LAB — POCT GLYCOSYLATED HEMOGLOBIN (HGB A1C): Hemoglobin A1C: 7.7

## 2016-05-20 NOTE — Patient Instructions (Addendum)
Humulin only 30 units daily  Take stable amount of Tresiba starting with 46 units regardless of am sugar unless am sugar hi or lo for a week  Divide Carb gms by 10 to get Novolog dose, take 1 unit less if sugar low normal

## 2016-05-20 NOTE — Progress Notes (Signed)
Patient ID: [name], female   DOB: 01/08/1961, 56 y.o.   MRN: [id number]   Reason for Appointment : Follow up for Type 1 Diabetes  History of Present Illness           Diagnosis: Type 2 diabetes mellitus, date of diagnosis: 1990        Past history: She has been on insulin since 2007. Generally has had poor control in requiring large doses of insulin. Blood sugars also have been previously variable making her control difficult. She has had difficulty following instructions for mealtime insulin consistently in the past  Has been on a multiple injection regimen of Lantus twice a day, NovoLog a.c. and NPH at bedtime A1c has been as high as 11.3 previously   Recent history: INSULIN regimen is described as: Tresiba 45-50 Humulin N 34 hs, NovoLog a.c.12- 14-16 before meals  Although her A1c is below 8% it is not improving and still 7.7 today  Glucose patterns , current management and problems identified:  She was recommended changing her dose of NPH to 30 units in the last visit and to keep a steady dose of 46 units of [place], written instructions were given but she is still taking the same regimen as before  She says she is adjusting her to see a dose based on her fasting reading daily  Although she does have variability in her morning readings they are relatively better in the last few days  Also has occasional overnight mild hypoglycemia, lowest reading 68 about 3 days ago  She is not checking her sugars postprandially much and difficult to get her consistent pattern  HIGHEST blood sugars appear to be late afternoon and before supper possibly from inadequate or no coverage of her meals at lunch  Difficult to know if she is consistently compliant, she thinks she is taking NovoLog before eating all the time  She may however take it after eating if her blood sugar is low normal before breakfast  She has been referred to the dietitian but still has not been  seen  Not able to adjust her mealtime doses based on what she is eating currently  Has been taking her Victoza as directed  Glucose monitoring:  done  1-2 times a day         Glucometer:  Accu-Chek [employer]iva       Blood Glucose readings from meter download:   Mean values apply above for all meters except median for One Touch  PRE-MEAL Fasting Lunch Dinner Bedtime Overall  Glucose range: 68-257  124-311  115-254    Mean/median:     144   Physical activity: exercise: Walking some   Self-care: The diet that the patient has been following is low fat,  Lunch : Sometimes salad otherwise sandwiches  Meals: 3 meals per day.  breakfast is at lunch 12 pm Supper at about 6-7 PM, mealtimes are variable           Dietician visit: Most recent:.Several years ago  Last CD consult in 10/2013            Wt Readings from Last 3 Encounters:  05/20/16 187 lb (84.8 kg)  05/17/16 185 lb (83.9 kg)  03/22/16 185 lb (83.9 kg)   A1c results  Lab Results  Component Value Date   HGBA1C 7.7 05/20/2016   HGBA1C 7.8 01/17/2016   HGBA1C 8.6 10/25/2015   Lab Results  Component Value Date   MICROALBUR 1.9 10/14/2014  LDLCALC 93 01/23/2015   CREATININE 2.46 (H) 09/22/2015     Lab Results  Component Value Date   MICROALBUR 1.9 10/14/2014     Allergies as of 05/20/2016      Reactions   Wheat Swelling   Latex Rash   Penicillins Rash   Sulfa Antibiotics Rash      Medication List       Accurate as of 05/20/16 11:15 AM. Always use your most recent med list.          acetaminophen 500 MG tablet Commonly known as:  TYLENOL Take 500 mg by mouth every 6 (six) hours as needed for moderate pain.   allopurinol 100 MG tablet Commonly known as:  ZYLOPRIM Take 150 mg by mouth every morning. Reported on 06/15/2015   benazepril 20 MG tablet Commonly known as:  LOTENSIN Take 10 mg by mouth every morning. Takes 1/2 tablet   calcitRIOL 0.25 MCG capsule Commonly known as:  ROCALTROL Take 0.25 mcg by  mouth daily.   COLCRYS 0.6 MG tablet Generic drug:  colchicine Take 0.6 mg by mouth daily as needed (gout flare). Reported on 06/15/2015   COREG CR 40 MG 24 hr capsule Generic drug:  carvedilol TAKE ONE CAPSULE BY MOUTH DAILY.   furosemide 80 MG tablet Commonly known as:  LASIX TAKE (1) TABLET BY MOUTH TWICE DAILY.   HUMULIN N KWIKPEN 100 UNIT/ML Kiwkpen Generic drug:  Insulin NPH (Human) ([place]) INJECT 34 UNITS SUBCUTANEOUSLY AT BEDTIME.   Insulin Degludec 200 UNIT/ML Sopn Commonly known as:  TRESIBA FLEXTOUCH Inject 56 Units into the skin daily with breakfast.   LANOXIN 0.125 MG tablet Generic drug:  digoxin TAKE 1 TABLET BY MOUTH DAILY EXCEPT ON SATURDAY AND SUNDAY.   liraglutide 18 MG/3ML Sopn Commonly known as:  VICTOZA INJECT 1.2MG  SUBCUTANEOUSLY DAILY AS DIRECTED.   NOVOLOG FLEXPEN 100 UNIT/ML FlexPen Generic drug:  insulin aspart INJECT SUBCUTANEOUSLY 12 UNITS BEFORE BREAKFAST; 14 UNITS AT LUNCH; AND 16 UNITS AT DINNER.   spironolactone 25 MG tablet Commonly known as:  ALDACTONE Take 25 mg by mouth every morning.   traMADol-acetaminophen 37.5-325 MG tablet Commonly known as:  ULTRACET Take 1 tablet by mouth every 6 (six) hours as needed. Reported on 06/15/2015   warfarin 5 MG tablet Commonly known as:  COUMADIN Take 1/2 tablet daily except 1 tablet on Mondays and Fridays       Allergies:  Allergies  Allergen Reactions  . Wheat Swelling  . Latex Rash  . Penicillins Rash  . Sulfa Antibiotics Rash    Past Medical History:  Diagnosis Date  . Automatic implantable cardioverter-defibrillator in situ    a. 03/2013: BSX Energen CRTD BiV ICC, ser# [id number]  . Chronic anticoagulation    a. coumadin.  . Chronic atrial fibrillation (HCC)    embolic rind  . Chronic kidney disease    creatinine- 1.8 in 09/2006 and 2.0 in 05/2007; 2.05 in 2011, 2.29 in 2012  . Chronic systolic CHF (congestive heart failure) ([place])    a. 03/2013 Echo: Sev LV dysfxn with sev diff  HK, restrictive phys, diast dysfxn, mild MR, sev dil LA/RA, mod PR, PASP 85mmHg.  . Congenital third degree heart block    a. Guidant VVI pacemaker implanted in 09/1997; b. gen change in 01/2004;  c. 03/2013 upgrade to [place], ser# [id number].  . Dizziness and giddiness 05/19/2012  . Dysphagia 08/14/2011   FEB 2013 EGD/DIL 16 MM; GERD; h/o gastroesophageal reflux disease; + H. Pylori  gastritis   . GERD (gastroesophageal reflux disease)   . Helicobacter pylori gastritis 08/14/2011  . IDDM (insulin dependent diabetes mellitus) ([place])   . Kidney stones 1990's  . Nonischemic cardiomyopathy ([place])    a. 03/2013 Echo: Sev LV dysfxn with sev diff HK, restrictive phys, diast dysfxn, mild MR, sev dil LA/RA, mod PR, PASP 74mmHg;  b. 03/2013: BSX Energen CRTD BiV ICC, ser# [id number].  Marland Kitchen Stroke [facility]) 1999   denies residual on 04/21/2013    Past Surgical History:  Procedure Laterality Date  . BI-VENTRICULAR IMPLANTABLE CARDIOVERTER DEFIBRILLATOR  (CRT-D)  04/21/2013  . BI-VENTRICULAR IMPLANTABLE CARDIOVERTER DEFIBRILLATOR UPGRADE N/A 04/21/2013   Procedure: BI-VENTRICULAR IMPLANTABLE CARDIOVERTER DEFIBRILLATOR UPGRADE;  Surgeon: Evans Lance, MD;  Location: [facility] CATH LAB;  Service: Cardiovascular;  Laterality: N/A;  . COLONOSCOPY  04/23/2011   YOV:ZCHYIFOYDX COLON POLYP/ INTERNAL HEMORRHOIDS/ TORTUOUS COLON  . INSERT / REPLACE / REMOVE PACEMAKER  09/1997   Guidant VVI [employer]  . PACEMAKER GENERATOR CHANGE  01/2004  . UPPER GASTROINTESTINAL ENDOSCOPY  FEB 2013   RING/DIL TO 16 MM, H PYLORI GASTRITIS    Family History  Problem Relation Age of Onset  . Adopted: Yes  . Colon cancer Neg Hx     Social History:  reports that she has never smoked. She has never used smokeless tobacco. She reports that she does not drink alcohol or use drugs.    Review of Systems:   She has had stable chronic renal insufficiency, followed by nephrologist.  On calcitriol for secondary  hyperparathyroidism No history of microalbuminuria   Lab Results  Component Value Date   CREATININE 2.46 (H) 09/22/2015   BUN 67 (H) 09/22/2015   NA 135 09/22/2015   K 4.4 09/22/2015   CL 101 09/22/2015   CO2 26 09/22/2015    She has had cardiomyopathy and History of atrial fibrillation followed by cardiologist  Lipids: Has dyslipidemia with low HDL, high triglycerides, not on any treatment  Lab Results  Component Value Date   CHOL 131 09/22/2015   HDL 23.30 (L) 09/22/2015   LDLCALC 93 01/23/2015   LDLDIRECT 65.0 09/22/2015   TRIG 247.0 (H) 09/22/2015   CHOLHDL 6 09/22/2015    Foot exam in 11/16 normal except absent pedal pulses on the right  She thinks she had vascular studies done by her cardiologist and reportedly normal   Physical Examination:  BP 112/70   Pulse 64   Ht 5\' 4"  (1.626 m)   Wt 187 lb (84.8 kg)   SpO2 97%   BMI 32.10 kg/m       No ankle edema present  Diabetic Foot Exam - Simple   No data filed      ASSESSMENT:  Diabetes type 1:  See history of present illness for detailed discussion of her current blood sugar patterns, treatment regimen, meal planning and problems identified Her blood sugars are Again inconsistently controlled  Currently A1c is 7.7 which is relatively high and most likely because of postprandial hyperglycemia, her fasting readings are averaging about 130 Postprandial readings tend be as high as 311  She does not check her sugars much after meals and difficult to know what her postprandial patterns are Although recently her FASTING blood sugars are fairly good she does have some fluctuation and higher readings may be from postprandial hyperglycemia night before She needs more diabetes education and to make sure that she is taking her NovoLog on time and not skipping the dose  Currently not a candidate  for the V-go pump because of her relatively large insulin requirement, however cannot rule out this out  completely   PLAN:     She will start taking a consistent amount of Tresiba starting with 46 units and only Adjust the dose if fasting readings are consistently high or low, she will call if she has a problem with this   Reduce NPH to 30 units at bedtime to avoid overnight hypoglycemia, she needs to make a note of her insulin doses and not forget to change as she did on the last visit  She needs to take her NovoLog very consistently before each meal even when eating out  She will see the dietitian and would start basing her NovoLog with carbohydrate counting ratios,  starting with 1:10  Meanwhile she was told to keep a record of what she is eating for all meals for 3 days and keep a log of her pre-and post blood sugar along with insulin dose and take this to the dietitian  Follow-up in 3 months  Patient Instructions  Humulin only 30 units daily  Take stable amount of Tresiba starting with 46 units regardless of am sugar unless am sugar hi or lo for a week  Divide Carb gms by 10 to get Novolog dose, take 1 unit less if sugar low normal          Counseling time on subjects discussed above is over 50% of today's 25 minute visit    Jakiyah Stepney 05/20/2016, 11:15 AM

## 2016-05-24 ENCOUNTER — Encounter: Payer: Medicaid Other | Admitting: Dietician

## 2016-05-28 ENCOUNTER — Ambulatory Visit: Payer: Medicaid Other | Admitting: Nurse Practitioner

## 2016-05-31 ENCOUNTER — Other Ambulatory Visit: Payer: Self-pay | Admitting: Endocrinology

## 2016-06-03 ENCOUNTER — Ambulatory Visit (INDEPENDENT_AMBULATORY_CARE_PROVIDER_SITE_OTHER): Payer: Self-pay | Admitting: Nurse Practitioner

## 2016-06-03 ENCOUNTER — Encounter: Payer: Self-pay | Admitting: Nurse Practitioner

## 2016-06-03 VITALS — BP 112/77 | HR 72 | Temp 97.5°F | Ht 64.0 in | Wt 186.6 lb

## 2016-06-03 DIAGNOSIS — D126 Benign neoplasm of colon, unspecified: Secondary | ICD-10-CM

## 2016-06-03 NOTE — Progress Notes (Signed)
Patient elected to reschedule  Visit cancelled

## 2016-06-12 ENCOUNTER — Other Ambulatory Visit: Payer: Self-pay | Admitting: Internal Medicine

## 2016-06-12 ENCOUNTER — Other Ambulatory Visit: Payer: Self-pay

## 2016-06-12 MED ORDER — LIRAGLUTIDE 18 MG/3ML ~~LOC~~ SOPN: PEN_INJECTOR | SUBCUTANEOUS | 2 refills | Status: DC

## 2016-06-19 ENCOUNTER — Ambulatory Visit: Payer: Medicaid Other | Admitting: Registered"

## 2016-06-26 ENCOUNTER — Ambulatory Visit (INDEPENDENT_AMBULATORY_CARE_PROVIDER_SITE_OTHER): Payer: Medicaid Other | Admitting: *Deleted

## 2016-06-26 DIAGNOSIS — Z5181 Encounter for therapeutic drug level monitoring: Secondary | ICD-10-CM

## 2016-06-26 DIAGNOSIS — I4891 Unspecified atrial fibrillation: Secondary | ICD-10-CM

## 2016-06-26 LAB — POCT INR: INR: 3

## 2016-07-05 ENCOUNTER — Other Ambulatory Visit: Payer: Self-pay | Admitting: Endocrinology

## 2016-07-07 ENCOUNTER — Encounter (HOSPITAL_COMMUNITY): Payer: Self-pay | Admitting: *Deleted

## 2016-07-07 ENCOUNTER — Emergency Department (HOSPITAL_COMMUNITY)
Admission: EM | Admit: 2016-07-07 | Discharge: 2016-07-07 | Disposition: A | Discharge status: Home / Self Care | Payer: Medicaid Other | Attending: Emergency Medicine | Admitting: Emergency Medicine

## 2016-07-07 DIAGNOSIS — M436 Torticollis: Secondary | ICD-10-CM

## 2016-07-07 DIAGNOSIS — N184 Chronic kidney disease, stage 4 (severe): Secondary | ICD-10-CM | POA: Insufficient documentation

## 2016-07-07 DIAGNOSIS — Z79899 Other long term (current) drug therapy: Secondary | ICD-10-CM | POA: Insufficient documentation

## 2016-07-07 DIAGNOSIS — E1122 Type 2 diabetes mellitus with diabetic chronic kidney disease: Secondary | ICD-10-CM | POA: Insufficient documentation

## 2016-07-07 DIAGNOSIS — Z7901 Long term (current) use of anticoagulants: Secondary | ICD-10-CM | POA: Insufficient documentation

## 2016-07-07 DIAGNOSIS — M542 Cervicalgia: Secondary | ICD-10-CM | POA: Diagnosis present

## 2016-07-07 DIAGNOSIS — I5022 Chronic systolic (congestive) heart failure: Secondary | ICD-10-CM | POA: Insufficient documentation

## 2016-07-07 MED ORDER — OXYCODONE-ACETAMINOPHEN 5-325 MG PO TABS: 1.0000 | ORAL_TABLET | ORAL | 0 refills | Status: DC | PRN

## 2016-07-07 MED ORDER — BACLOFEN 10 MG PO TABS: 10.0000 mg | ORAL_TABLET | Freq: Two times a day (BID) | ORAL | 0 refills | Status: DC

## 2016-07-07 MED ORDER — OXYCODONE-ACETAMINOPHEN 5-325 MG PO TABS
1.0000 | ORAL_TABLET | Freq: Once | ORAL | Status: AC
  Administered 2016-07-07: 1 via ORAL
  Filled 2016-07-07: qty 1

## 2016-07-07 MED ORDER — BACLOFEN 10 MG PO TABS
10.0000 mg | ORAL_TABLET | Freq: Three times a day (TID) | ORAL | Status: DC
  Administered 2016-07-07: 10 mg via ORAL
  Filled 2016-07-07: qty 1

## 2016-07-07 NOTE — ED Notes (Signed)
Pt alert & oriented x4, stable gait. Patient given discharge instructions, paperwork & prescription(s). Patient informed not to drive, operate any equipment & handel any important documents 4 hours after taking pain medication. Patient  instructed to stop at the registration desk to finish any additional paperwork. Patient  verbalized understanding. Pt left department w/ no further questions. 

## 2016-07-07 NOTE — ED Triage Notes (Signed)
Pt states for the last 3 days she has had a stiff neck like she slept on it wrong. c/o left neck & shoulder pain.

## 2016-07-07 NOTE — ED Notes (Signed)
Pt says pain in neck when she moves her head. No pain when moving her arm.

## 2016-07-07 NOTE — ED Provider Notes (Signed)
[facility] DEPT Provider Note   CSN: [id number] Arrival date & time: 07/07/16  0344     History   Chief Complaint Chief Complaint  Patient presents with  . Torticollis    HPI [name] is a 56 y.o. female.  The history is provided by the patient.  She woke up 4 days ago with pain in the left side of her neck with some radiation to the shoulder. Her head was also turned to the left. This is associated with pain. She rates pain at 5/10 currently, blood 10/10 if she tries to move her head. There is no associated weakness or numbness or tingling. She denies any recent trauma or unusual activity. She has tried taking acetaminophen and using heat with no relief. She's never had any problem like this before.  Past Medical History:  Diagnosis Date  . Automatic implantable cardioverter-defibrillator in situ    a. 03/2013: BSX Energen CRTD BiV ICC, ser# [id number]  . Chronic anticoagulation    a. coumadin.  . Chronic atrial fibrillation (HCC)    embolic rind  . Chronic kidney disease    creatinine- 1.8 in 09/2006 and 2.0 in 05/2007; 2.05 in 2011, 2.29 in 2012  . Chronic systolic CHF (congestive heart failure) ([place])    a. 03/2013 Echo: Sev LV dysfxn with sev diff HK, restrictive phys, diast dysfxn, mild MR, sev dil LA/RA, mod PR, PASP 24mmHg.  . Congenital third degree heart block    a. Guidant VVI pacemaker implanted in 09/1997; b. gen change in 01/2004;  c. 03/2013 upgrade to [place], ser# [id number].  . Dizziness and giddiness 05/19/2012  . Dysphagia 08/14/2011   FEB 2013 EGD/DIL 16 MM; GERD; h/o gastroesophageal reflux disease; + H. Pylori gastritis   . GERD (gastroesophageal reflux disease)   . Helicobacter pylori gastritis 08/14/2011  . IDDM (insulin dependent diabetes mellitus) ([place])   . Kidney stones 1990's  . Nonischemic cardiomyopathy ([place])    a. 03/2013 Echo: Sev LV dysfxn with sev diff HK, restrictive phys, diast dysfxn, mild MR, sev dil LA/RA, mod PR, PASP  7mmHg;  b. 03/2013: BSX Energen CRTD BiV ICC, ser# [id number].  Marland Kitchen Stroke [facility]) 1999   denies residual on 04/21/2013    Patient Active Problem List   Diagnosis Date Noted  . Colon adenomas 06/15/2015  . Pain in joint, upper arm 10/19/2013  . Biventricular ICD (implantable cardioverter-defibrillator) in place 05/21/2013  . Other and unspecified hyperlipidemia 04/11/2013  . Chronic systolic heart failure ([place]) 04/07/2013  . Encounter for therapeutic drug monitoring 03/29/2013  . Chronic kidney disease, stage IV (severe) ([place]) 01/27/2013  . Dyspepsia 09/10/2012  . Gout 05/15/2012  . Laboratory test 06/16/2011  . Chronic anticoagulation   . Congenital third degree heart block   . 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 Cardiac pacemaker in situ 04/24/2009  . 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 CARDIOMYOPATHY 01/20/2008  . ATRIAL FIBRILLATION, CHRONIC 01/20/2008  . Cardiorenal syndrome/chronic kidney disease 01/20/2008    Past Surgical History:  Procedure Laterality Date  . BI-VENTRICULAR IMPLANTABLE CARDIOVERTER DEFIBRILLATOR  (CRT-D)  04/21/2013  . BI-VENTRICULAR IMPLANTABLE CARDIOVERTER DEFIBRILLATOR UPGRADE N/A 04/21/2013   Procedure: BI-VENTRICULAR IMPLANTABLE CARDIOVERTER DEFIBRILLATOR UPGRADE;  Surgeon: Evans Lance, MD;  Location: [facility] CATH LAB;  Service: Cardiovascular;  Laterality: N/A;  . COLONOSCOPY  04/23/2011   YQM:[id number] COLON POLYP/ INTERNAL HEMORRHOIDS/ TORTUOUS COLON  . INSERT / REPLACE / REMOVE PACEMAKER  09/1997  Guidant VVI [employer]  . PACEMAKER GENERATOR CHANGE  01/2004  . UPPER GASTROINTESTINAL ENDOSCOPY  FEB 2013   RING/DIL TO 16 MM, H PYLORI GASTRITIS    OB History    No data available       Home Medications    Prior to Admission medications   Medication Sig Start Date End Date Taking? Authorizing Provider  acetaminophen (TYLENOL) 500 MG tablet Take 500 mg by  mouth every 6 (six) hours as needed for moderate pain.   Yes [provider]  allopurinol (ZYLOPRIM) 100 MG tablet Take 150 mg by mouth every morning. Reported on 06/15/2015   Yes [provider]  benazepril (LOTENSIN) 20 MG tablet Take 10 mg by mouth every morning. Takes 1/2 tablet   Yes [provider]  calcitRIOL (ROCALTROL) 0.25 MCG capsule Take 0.25 mcg by mouth daily.   Yes [provider]  COLCRYS 0.6 MG tablet Take 0.6 mg by mouth daily as needed (gout flare). Reported on 06/15/2015 06/27/11  Yes [provider]  COREG CR 40 MG 24 hr capsule TAKE ONE CAPSULE BY MOUTH DAILY. 10/20/15  Yes Evans Lance, MD  furosemide (LASIX) 80 MG tablet TAKE (1) TABLET BY MOUTH TWICE DAILY. 04/26/16  Yes Evans Lance, MD  HUMULIN N KWIKPEN 100 UNIT/ML Kiwkpen INJECT 34 UNITS SUBCUTANEOUSLY AT BEDTIME. 12/13/15  Yes Elayne Snare, MD  LANOXIN 125 MCG tablet TAKE 1 TABLET BY MOUTH DAILY EXCEPT ON SATURDAY AND SUNDAY. 06/12/16  Yes Evans Lance, MD  liraglutide (VICTOZA) 18 MG/3ML SOPN INJECT 1.2MG  SUBCUTANEOUSLY DAILY AS DIRECTED. 06/12/16  Yes Elayne Snare, MD  NOVOLOG FLEXPEN 100 UNIT/ML FlexPen INJECT SUBCUTANEOUSLY 12 UNITS BEFORE BREAKFAST; 14 UNITS AT LUNCH; AND 16 UNITS AT DINNER. 05/14/16  Yes Elayne Snare, MD  spironolactone (ALDACTONE) 25 MG tablet Take 25 mg by mouth every morning.    Yes [provider]  traMADol-acetaminophen (ULTRACET) 37.5-325 MG tablet Take 1 tablet by mouth every 6 (six) hours as needed. Reported on 06/15/2015   Yes [provider]  TRESIBA FLEXTOUCH 200 UNIT/ML SOPN INJECT 56 UNITS INTO THE SKIN DAILY BEFORE BREAKFAST. Patient taking differently: INJECT 56 UNITS INTO THE SKIN DAILY BEFORE BREAKFAST. Taking 44 units daily 05/31/16  Yes Elayne Snare, MD  warfarin (COUMADIN) 5 MG tablet Take 1/2 tablet daily except 1 tablet on Mondays and Fridays 01/08/16  Yes Herminio Commons, MD  baclofen (LIORESAL) 10 MG tablet Take 1  tablet (10 mg total) by mouth 2 (two) times daily. 10/05/89   Delora Fuel, MD  oxyCODONE-acetaminophen (PERCOCET) 5-325 MG tablet Take 1 tablet by mouth every 4 (four) hours as needed for moderate pain. 4/78/29   Delora Fuel, MD    Family History Family History  Problem Relation Age of Onset  . Adopted: Yes  . Colon cancer Neg Hx     Social History Social History  Substance Use Topics  . Smoking status: Never Smoker  . Smokeless tobacco: Never Used     Comment: Never smoked  . Alcohol use No     Allergies   Wheat; Latex; Penicillins; and Sulfa antibiotics   Review of Systems Review of Systems  All other systems reviewed and are negative.    Physical Exam Updated Vital Signs BP 122/80 (BP Location: Right Arm)   Pulse 60   Temp 97.6 F (36.4 C) (Oral)   Resp 18   Ht 5\' 4"  (1.626 m)   Wt 183 lb (83 kg)   SpO2 98%  BMI 31.41 kg/m   Physical Exam  Nursing note and vitals reviewed.  56 year old female, resting comfortably and in no acute distress. Vital signs are normal. Oxygen saturation is 98%, which is normal. Head is normocephalic and atraumatic. PERRLA, EOMI. Oropharynx is clear. Neck is tender in the left sternocleidomastoid muscle. She holds the head slightly rotated to the left and slightly flexed to the left. There is pain on passive lateral flexion to the right and with rotation to the right. There is no adenopathy or JVD. Back is nontender and there is no CVA tenderness. Lungs are clear without rales, wheezes, or rhonchi. Chest is nontender. Heart has regular rate and rhythm without murmur. Abdomen is soft, flat, nontender without masses or hepatosplenomegaly and peristalsis is normoactive. Extremities have no cyanosis or edema, full range of motion is present. Skin is warm and dry without rash. Neurologic: Mental status is normal, cranial nerves are intact, there are no motor or sensory deficits.  ED Treatments / Results   Procedures Procedures  (including critical care time)  Medications Ordered in ED Medications  baclofen (LIORESAL) tablet 10 mg (10 mg Oral Given 07/07/16 0432)  oxyCODONE-acetaminophen (PERCOCET/ROXICET) 5-325 MG per tablet 1 tablet (1 tablet Oral Given 07/07/16 0431)     Initial Impression / Assessment and Plan / ED Course  I have reviewed the triage vital signs and the nursing notes.  Torticollis. No evidence of any neurologic injury. I have discussed the diagnosis with the patient. No indication for x-ray or laboratory testing. Patient is very concerned about medications because she has multiple medical problems. She is anticoagulated on warfarin, so NSAIDs cannot be used. In addition to medications listed in her chart, she brought medications that she has taken in the past. She had taken we are SL and oxycodone have acetaminophen in the past. Since she has experience with these and have has not had problems, she is given prescription for they are sella and also for oxycodone have acetaminophen. Advised to use ice as well as heat. Follow-up with PCP.  Final Clinical Impressions(s) / ED Diagnoses   Final diagnoses:  Torticollis  Anticoagulated on warfarin    New Prescriptions New Prescriptions   BACLOFEN (LIORESAL) 10 MG TABLET    Take 1 tablet (10 mg total) by mouth 2 (two) times daily.   OXYCODONE-ACETAMINOPHEN (PERCOCET) 5-325 MG TABLET    Take 1 tablet by mouth every 4 (four) hours as needed for moderate pain.     Delora Fuel, MD 37/16/96 [phone]

## 2016-07-07 NOTE — Discharge Instructions (Signed)
Apply ice, heat as needed.

## 2016-07-08 ENCOUNTER — Other Ambulatory Visit: Payer: Self-pay | Admitting: Endocrinology

## 2016-07-11 ENCOUNTER — Ambulatory Visit: Payer: Medicaid Other | Admitting: Gastroenterology

## 2016-07-11 ENCOUNTER — Encounter: Payer: Self-pay | Admitting: Gastroenterology

## 2016-07-11 ENCOUNTER — Ambulatory Visit (INDEPENDENT_AMBULATORY_CARE_PROVIDER_SITE_OTHER): Payer: Medicaid Other | Admitting: Gastroenterology

## 2016-07-11 DIAGNOSIS — D126 Benign neoplasm of colon, unspecified: Secondary | ICD-10-CM | POA: Diagnosis not present

## 2016-07-11 NOTE — Progress Notes (Signed)
Subjective:    Patient ID: [name], female    DOB: [birth year], 56 y.o.   MRN: [id number]   Toni Fire, MD  HPI Having problem with neck pain. TOOK IBUPROFEN x1 IN ED. ALSO USING TYLENOL. GI SYMPTOMS PRETTY GOOD. CAN'T DRINK COLD WATER SINCE ENDOSCOPY. BMS: EVERY DAY, COMPLETE EVACUATION. LAST TSC FEB 2013: 3 MM SIMPLE ADENOMA REMOVED.  PT DENIES FEVER, CHILLS, HEMATOCHEZIA, nausea, vomiting, melena, diarrhea, CHEST PAIN, SHORTNESS OF BREATH,  CHANGE IN BOWEL IN HABITS, constipation, abdominal pain, problems swallowing, OR heartburn or indigestion.  Past Medical History:  Diagnosis Date  . Automatic implantable cardioverter-defibrillator in situ    a. 03/2013: BSX Energen CRTD BiV ICC, ser# [id number]  . Chronic anticoagulation    a. coumadin.  . Chronic atrial fibrillation (HCC)    embolic rind  . Chronic kidney disease    creatinine- 1.8 in 09/2006 and 2.0 in 05/2007; 2.05 in 2011, 2.29 in 2012  . Chronic systolic CHF (congestive heart failure) ([place])    a. 03/2013 Echo: Sev LV dysfxn with sev diff HK, restrictive phys, diast dysfxn, mild MR, sev dil LA/RA, mod PR, PASP 48mmHg.  . Congenital third degree heart block    a. Guidant VVI pacemaker implanted in 09/1997; b. gen change in 01/2004;  c. 03/2013 upgrade to [place], ser# [id number].  . Dizziness and giddiness 05/19/2012  . Dysphagia 08/14/2011   FEB 2013 EGD/DIL 16 MM; GERD; h/o gastroesophageal reflux disease; + H. Pylori gastritis   . GERD (gastroesophageal reflux disease)   . Helicobacter pylori gastritis 08/14/2011  . IDDM (insulin dependent diabetes mellitus) ([place])   . Kidney stones 1990's  . Nonischemic cardiomyopathy ([place])    a. 03/2013 Echo: Sev LV dysfxn with sev diff HK, restrictive phys, diast dysfxn, mild MR, sev dil LA/RA, mod PR, PASP 23mmHg;  b. 03/2013: BSX Energen CRTD BiV ICC, ser# [id number].  Marland Kitchen Stroke [facility]) 1999   denies residual on 04/21/2013   Past Surgical History:  Procedure Laterality Date    . BI-VENTRICULAR IMPLANTABLE CARDIOVERTER DEFIBRILLATOR  (CRT-D)  04/21/2013  . BI-VENTRICULAR IMPLANTABLE CARDIOVERTER DEFIBRILLATOR UPGRADE N/A 04/21/2013   Procedure: BI-VENTRICULAR IMPLANTABLE CARDIOVERTER DEFIBRILLATOR UPGRADE;  Surgeon: Evans Lance, MD;  Location: [facility] CATH LAB;  Service: Cardiovascular;  Laterality: N/A;  . COLONOSCOPY  04/23/2011   LOV:FIEPPIRJJO COLON POLYP/ INTERNAL HEMORRHOIDS/ TORTUOUS COLON  . INSERT / REPLACE / REMOVE PACEMAKER  09/1997   Guidant VVI [employer]  . PACEMAKER GENERATOR CHANGE  01/2004  . UPPER GASTROINTESTINAL ENDOSCOPY  FEB 2013   RING/DIL TO 16 MM, H PYLORI GASTRITIS   Allergies  Allergen Reactions  . Wheat Swelling  . Latex Rash  . Penicillins Rash  . Sulfa Antibiotics Rash   Current Outpatient Prescriptions  Medication Sig Dispense Refill  . ACCU-CHEK SOFTCLIX LANCETS lancets USE TO TEST BLOOD SUGAR FOUR TIMES DAILY AS DIRECTED.    Marland Kitchen acetaminophen (TYLENOL) 500 MG tablet Take 500 mg by mouth every 6 (six) hours as needed for moderate pain.    Marland Kitchen allopurinol (ZYLOPRIM) 100 MG tablet Take 150 mg by mouth every morning. Reported on 06/15/2015    . baclofen (LIORESAL) 10 MG tablet Take 1 tablet (10 mg total) by mouth 2 (two) times daily.    . benazepril (LOTENSIN) 20 MG tablet Take 10 mg by mouth every morning. Takes 1/2 tablet    . calcitRIOL (ROCALTROL) 0.25 MCG capsule Take 0.25 mcg by mouth daily.    Marland Kitchen COLCRYS 0.6  MG tablet Take 0.6 mg by mouth daily as needed (gout flare). Reported on 06/15/2015    . COREG CR 40 MG 24 hr capsule TAKE ONE CAPSULE BY MOUTH DAILY.    . furosemide (LASIX) 80 MG tablet TAKE (1) TABLET BY MOUTH TWICE DAILY.    Marland Kitchen HUMULIN N KWIKPEN 100 UNIT/ML Kiwkpen INJECT 34 UNITS SUBCUTANEOUSLY AT BEDTIME.    Marland Kitchen LANOXIN 125 MCG tablet TAKE 1 TABLET BY MOUTH DAILY EXCEPT ON SATURDAY AND SUNDAY.    Marland Kitchen liraglutide (VICTOZA) 18 MG/3ML SOPN INJECT 1.2MG  SUBCUTANEOUSLY DAILY AS DIRECTED.    Marland Kitchen LITETOUCH PEN NEEDLES 31G X 8 MM  MISC USE AS DIRECTED UP TO 4 TIMES DAILY.    Marland Kitchen NOVOLOG FLEXPEN 100 UNIT/ML FlexPen INJECT SUBCUTANEOUSLY 12 UNITS BEFORE BREAKFAST; 14 UNITS AT LUNCH; AND 16 UNITS AT DINNER.    Marland Kitchen oxyCODONE-acetaminophen (PERCOCET) 5-325 MG tablet Take 1 tablet by mouth every 4 (four) hours as needed for moderate pain.    Marland Kitchen spironolactone (ALDACTONE) 25 MG tablet Take 25 mg by mouth every morning.     . traMADol-acetaminophen (ULTRACET) 37.5-325 MG tablet Take 1 tablet by mouth every 6 (six) hours as needed. Reported on 06/15/2015    . TRESIBA FLEXTOUCH 200 UNIT/ML SOPN INJECT 56 UNITS INTO THE SKIN DAILY BEFORE BREAKFAST. (Patient taking differently: INJECT 56 UNITS INTO THE SKIN DAILY BEFORE BREAKFAST. Taking 44 units daily)    . warfarin (COUMADIN) 5 MG tablet Take 1/2 tablet daily except 1 tablet on Mondays and Fridays     Review of Systems PER HPI OTHERWISE ALL SYSTEMS ARE NEGATIVE.    Objective:   Physical Exam  Constitutional: She is oriented to person, place, and time. She appears well-developed and well-nourished. No distress.  HENT:  Head: Normocephalic and atraumatic.  Mouth/Throat: Oropharynx is clear and moist. No oropharyngeal exudate.  Eyes: Pupils are equal, round, and reactive to light. No scleral icterus.  Neck: Normal range of motion. Neck supple.  Cardiovascular: Normal rate.   Murmur heard. Irregular RHYTHM  Pulmonary/Chest: Effort normal and breath sounds normal. No respiratory distress.  Abdominal: Soft. Bowel sounds are normal. She exhibits no distension. There is no tenderness.  Musculoskeletal: She exhibits no edema.  Lymphadenopathy:    She has no cervical adenopathy.  Neurological: She is alert and oriented to person, place, and time.  Psychiatric: She has a normal mood and affect.  Vitals reviewed.     Assessment & Plan:

## 2016-07-11 NOTE — Assessment & Plan Note (Addendum)
NO WARNING SIGNS/SYMPTOMS. 1 SIMPLE ADENOMA REMOVED. 2017 RECOMMENDATIONS ARE FOR TCS AFTER FEB 2018 AND BEFORE FEB 2023.  COMPLETE CBC/CMP. NEXT COLONOSCOPY IN FEB 2020. FOLLOW UP IN 1 YEAR.

## 2016-07-11 NOTE — Patient Instructions (Signed)
COMPLETE LABS.  NEXT COLONOSCOPY IN FEB 2020.  FOLLOW UP IN 1 YEAR.

## 2016-07-13 ENCOUNTER — Other Ambulatory Visit: Payer: Self-pay | Admitting: Endocrinology

## 2016-07-15 NOTE — Progress Notes (Signed)
ON RECALL  °

## 2016-07-15 NOTE — Progress Notes (Signed)
cc'ed to pcp °

## 2016-08-01 ENCOUNTER — Encounter: Payer: Self-pay | Admitting: Internal Medicine

## 2016-08-01 ENCOUNTER — Ambulatory Visit (INDEPENDENT_AMBULATORY_CARE_PROVIDER_SITE_OTHER): Payer: Medicaid Other | Admitting: Internal Medicine

## 2016-08-01 VITALS — BP 130/80 | HR 83 | Ht 66.0 in | Wt 185.0 lb

## 2016-08-01 DIAGNOSIS — I482 Chronic atrial fibrillation, unspecified: Secondary | ICD-10-CM

## 2016-08-01 DIAGNOSIS — I5022 Chronic systolic (congestive) heart failure: Secondary | ICD-10-CM

## 2016-08-01 DIAGNOSIS — Z95810 Presence of automatic (implantable) cardiac defibrillator: Secondary | ICD-10-CM

## 2016-08-01 NOTE — Progress Notes (Signed)
HPI [name] returns today for folloow up. She is a pleasant 57 yo woman with a h/o chronic atrial fib and chb, s/p PPM who underwent upgrade to a BiV ICD 3 years ago. She has class 2a CHF. She feels well. She is exercising regularly without limitation. She does admit to some dietary indiscretion. She has finally gotten her DM under control. She denies peripheral edema.  Allergies  Allergen Reactions  . Wheat Swelling  . Latex Rash  . Penicillins Rash  . Sulfa Antibiotics Rash     Current Outpatient Prescriptions  Medication Sig Dispense Refill  . ACCU-CHEK SOFTCLIX LANCETS lancets USE TO TEST BLOOD SUGAR FOUR TIMES DAILY AS DIRECTED. 100 each PRN  . acetaminophen (TYLENOL) 500 MG tablet Take 500 mg by mouth every 6 (six) hours as needed for moderate pain.    Marland Kitchen allopurinol (ZYLOPRIM) 100 MG tablet Take 150 mg by mouth every morning. Reported on 06/15/2015    . baclofen (LIORESAL) 10 MG tablet Take 1 tablet (10 mg total) by mouth 2 (two) times daily. 30 each 0  . benazepril (LOTENSIN) 20 MG tablet Take 10 mg by mouth every morning. Takes 1/2 tablet    . calcitRIOL (ROCALTROL) 0.25 MCG capsule Take 0.25 mcg by mouth daily.    Marland Kitchen COLCRYS 0.6 MG tablet Take 0.6 mg by mouth daily as needed (gout flare). Reported on 06/15/2015    . COREG CR 40 MG 24 hr capsule TAKE ONE CAPSULE BY MOUTH DAILY. 30 capsule 11  . furosemide (LASIX) 80 MG tablet TAKE (1) TABLET BY MOUTH TWICE DAILY. 60 tablet 11  . HUMULIN N KWIKPEN 100 UNIT/ML Kiwkpen INJECT 34 UNITS SUBCUTANEOUSLY AT BEDTIME. 15 mL 0  . LANOXIN 125 MCG tablet TAKE 1 TABLET BY MOUTH DAILY EXCEPT ON SATURDAY AND SUNDAY. 90 tablet 3  . liraglutide (VICTOZA) 18 MG/3ML SOPN INJECT 1.2MG  SUBCUTANEOUSLY DAILY AS DIRECTED. 6 mL 2  . LITETOUCH PEN NEEDLES 31G X 8 MM MISC USE AS DIRECTED UP TO 4 TIMES DAILY. 100 each PRN  . NOVOLOG FLEXPEN 100 UNIT/ML FlexPen INJECT SUBCUTANEOUSLY 12 UNITS BEFORE BREAKFAST; 14 UNITS AT LUNCH; AND 16 UNITS AT  DINNER. 15 mL 0  . oxyCODONE-acetaminophen (PERCOCET) 5-325 MG tablet Take 1 tablet by mouth every 4 (four) hours as needed for moderate pain. 15 tablet 0  . spironolactone (ALDACTONE) 25 MG tablet Take 25 mg by mouth every morning.     . traMADol-acetaminophen (ULTRACET) 37.5-325 MG tablet Take 1 tablet by mouth every 6 (six) hours as needed. Reported on 06/15/2015    . TRESIBA FLEXTOUCH 200 UNIT/ML SOPN INJECT 56 UNITS INTO THE SKIN DAILY BEFORE BREAKFAST. 9 mL 0  . warfarin (COUMADIN) 5 MG tablet Take 1/2 tablet daily except 1 tablet on Mondays and Fridays 20 tablet 6   No current facility-administered medications for this visit.      Past Medical History:  Diagnosis Date  . Automatic implantable cardioverter-defibrillator in situ    a. 03/2013: BSX Energen CRTD BiV ICC, ser# [id number]  . Chronic anticoagulation    a. coumadin.  . Chronic atrial fibrillation (HCC)    embolic rind  . Chronic kidney disease    creatinine- 1.8 in 09/2006 and 2.0 in 05/2007; 2.05 in 2011, 2.29 in 2012  . Chronic systolic CHF (congestive heart failure) ([place])    a. 03/2013 Echo: Sev LV dysfxn with sev diff HK, restrictive phys, diast dysfxn, mild MR, sev dil LA/RA, mod PR, PASP 28mmHg.  Marland Kitchen  Congenital third degree heart block    a. Guidant VVI pacemaker implanted in 09/1997; b. gen change in 01/2004;  c. 03/2013 upgrade to [place], ser# [id number].  . Dizziness and giddiness 05/19/2012  . Dysphagia 08/14/2011   FEB 2013 EGD/DIL 16 MM; GERD; h/o gastroesophageal reflux disease; + H. Pylori gastritis   . GERD (gastroesophageal reflux disease)   . Helicobacter pylori gastritis 08/14/2011  . IDDM (insulin dependent diabetes mellitus) ([place])   . Kidney stones 1990's  . Nonischemic cardiomyopathy ([place])    a. 03/2013 Echo: Sev LV dysfxn with sev diff HK, restrictive phys, diast dysfxn, mild MR, sev dil LA/RA, mod PR, PASP 94mmHg;  b. 03/2013: BSX Energen CRTD BiV ICC, ser# [id number].  Marland Kitchen Stroke [facility]) 1999   denies  residual on 04/21/2013    ROS:   All systems reviewed and negative except as noted in the HPI.   Past Surgical History:  Procedure Laterality Date  . BI-VENTRICULAR IMPLANTABLE CARDIOVERTER DEFIBRILLATOR  (CRT-D)  04/21/2013  . BI-VENTRICULAR IMPLANTABLE CARDIOVERTER DEFIBRILLATOR UPGRADE N/A 04/21/2013   Procedure: BI-VENTRICULAR IMPLANTABLE CARDIOVERTER DEFIBRILLATOR UPGRADE;  Surgeon: Evans Lance, MD;  Location[facility] CATH LAB;  Service: Cardiovascular;  Laterality: N/A;  . COLONOSCOPY  04/23/2011   CLE:[id number] COLON POLYP/ INTERNAL HEMORRHOIDS/ TORTUOUS COLON  . INSERT / REPLACE / REMOVE PACEMAKER  09/1997   Guidant VVI [employer]  . PACEMAKER GENERATOR CHANGE  01/2004  . UPPER GASTROINTESTINAL ENDOSCOPY  FEB 2013   RING/DIL TO 16 MM, H PYLORI GASTRITIS     Family History  Problem Relation Age of Onset  . Adopted: Yes  . Colon cancer Neg Hx   . Colon polyps Neg Hx      Social History   Social History  . Marital status: Married    Spouse name: N/A  . Number of children: 4  . Years of education: N/A   Occupational History  .  Unemployed   Social History Main Topics  . Smoking status: Never Smoker  . Smokeless tobacco: Never Used     Comment: Never smoked  . Alcohol use No  . Drug use: No  . Sexual activity: Yes   Other Topics Concern  . Not on file   Social History Narrative  . No narrative on file     BP 130/80   Pulse 83   Ht 5\' 6"  (1.676 m)   Wt 185 lb (83.9 kg)   SpO2 96%   BMI 29.86 kg/m   Physical Exam:  Well appearing middle aged woman, NAD HEENT: Unremarkable Neck:  6 cm JVD, no thyromegally Back:  No CVA tenderness Lungs:  Clear with no wheezes, rales, or rhonchi. No residual hematoma HEART:  Regular rate rhythm, no murmurs, no rubs, no clicks Abd:  soft, positive bowel sounds, no organomegally, no rebound, no guarding Ext:  2 plus pulses, no edema, no cyanosis, no clubbing Skin:  No rashes no nodules Neuro:  CN II through  XII intact, motor grossly intact   DEVICE  Normal device function.  See PaceArt for details.   Assess/Plan: 1. Chronic systolic heart failure - her symptoms are now class 2A. She will continue her current meds and maintain a low sodium diet. I have asked her to maintain an active lifestyle. 2. Atrial fib - her rates are well controlled. She will continue her warfarin 3. HTN - her blood pressure is well controlled. Will follow. I have encouraged her to lose 5 lbs. 4.  ICD  -  her Biv ICD [employer]) is working normally and has 7 years of battery longevity.  [name].D.

## 2016-08-01 NOTE — Patient Instructions (Signed)
Your physician wants you to follow-up in: 1 Year with Dr. Lovena Le. You will receive a reminder letter in the mail two months in advance. If you don't receive a letter, please call our office to schedule the follow-up appointment.  Your physician recommends that you continue on your current medications as directed. Please refer to the Current Medication list given to you today.  Remote monitoring is used to monitor your Pacemaker of ICD from home. This monitoring reduces the number of office visits required to check your device to one time per year. It allows [place] to keep an eye on the functioning of your device to ensure it is working properly. You are scheduled for a device check from home on 10/31/16. You may send your transmission at any time that day. If you have a wireless device, the transmission will be sent automatically. After your physician reviews your transmission, you will receive a postcard with your next transmission date.  If you need a refill on your cardiac medications before your next appointment, please call your pharmacy.  Thank you for choosing [facility]!

## 2016-08-03 LAB — COMPLETE METABOLIC PANEL WITH GFR
ALBUMIN: 3.8 g/dL (ref 3.6–5.1)
ALK PHOS: 142 U/L — AB (ref 33–130)
ALT: 25 U/L (ref 6–29)
AST: 24 U/L (ref 10–35)
BILIRUBIN TOTAL: 1.1 mg/dL (ref 0.2–1.2)
BUN: 56 mg/dL — ABNORMAL HIGH (ref 7–25)
CO2: 22 mmol/L (ref 20–31)
CREATININE: 2.47 mg/dL — AB (ref 0.50–1.05)
Calcium: 9.2 mg/dL (ref 8.6–10.4)
Chloride: 103 mmol/L (ref 98–110)
GFR, Est African American: 24 mL/min — ABNORMAL LOW (ref >=60)
GFR, Est Non African American: 21 mL/min — ABNORMAL LOW (ref >=60)
GLUCOSE: 247 mg/dL — AB (ref 65–99)
Potassium: 5.3 mmol/L (ref 3.5–5.3)
SODIUM: 138 mmol/L (ref 135–146)
TOTAL PROTEIN: 6.9 g/dL (ref 6.1–8.1)

## 2016-08-03 LAB — CBC WITH DIFFERENTIAL/PLATELET
BASOS ABS: 0 {cells}/uL (ref 0–200)
BASOS PCT: 0 %
EOS ABS: 75 {cells}/uL (ref 15–500)
Eosinophils Relative: 1 %
HCT: 36.6 % (ref 35.0–45.0)
HEMOGLOBIN: 11.5 g/dL — AB (ref 11.7–15.5)
LYMPHS ABS: 1950 {cells}/uL (ref 850–3900)
Lymphocytes Relative: 26 %
MCH: 26.7 pg — ABNORMAL LOW (ref 27.0–33.0)
MCHC: 31.4 g/dL — ABNORMAL LOW (ref 32.0–36.0)
MCV: 85.1 fL (ref 80.0–100.0)
MONOS PCT: 6 %
MPV: 12 fL (ref 7.5–12.5)
Monocytes Absolute: 450 cells/uL (ref 200–950)
NEUTROS ABS: 5025 {cells}/uL (ref 1500–7800)
Neutrophils Relative %: 67 %
PLATELETS: 188 10*3/uL (ref 140–400)
RBC: 4.3 MIL/uL (ref 3.80–5.10)
RDW: 16.9 % — ABNORMAL HIGH (ref 11.0–15.0)
WBC: 7.5 10*3/uL (ref 3.8–10.8)

## 2016-08-07 ENCOUNTER — Ambulatory Visit (INDEPENDENT_AMBULATORY_CARE_PROVIDER_SITE_OTHER): Payer: Medicaid Other | Admitting: *Deleted

## 2016-08-07 DIAGNOSIS — I4891 Unspecified atrial fibrillation: Secondary | ICD-10-CM | POA: Diagnosis not present

## 2016-08-07 DIAGNOSIS — Z5181 Encounter for therapeutic drug level monitoring: Secondary | ICD-10-CM | POA: Diagnosis not present

## 2016-08-07 LAB — CUP PACEART INCLINIC DEVICE CHECK
HighPow Impedance: 69 Ohm
Implantable Lead Implant Date: 20150225
Implantable Lead Location: 753858
Implantable Pulse Generator Implant Date: 20150225
Lead Channel Impedance Value: 410 Ohm
Lead Channel Pacing Threshold Amplitude: 0.6 V
Lead Channel Pacing Threshold Pulse Width: 0.5 ms
Lead Channel Sensing Intrinsic Amplitude: 23.5 mV
Lead Channel Setting Pacing Amplitude: 2.4 V
Lead Channel Setting Pacing Pulse Width: 0.5 ms
MDC IDC LEAD IMPLANT DT: 20150225
MDC IDC LEAD LOCATION: 753860
MDC IDC LEAD SERIAL: 311545
MDC IDC MSMT BATTERY REMAINING LONGEVITY: 84 mo
MDC IDC MSMT LEADCHNL LV PACING THRESHOLD AMPLITUDE: 0.4 V
MDC IDC MSMT LEADCHNL LV PACING THRESHOLD PULSEWIDTH: 0.5 ms
MDC IDC MSMT LEADCHNL LV SENSING INTR AMPL: 4.6 mV
MDC IDC MSMT LEADCHNL RA IMPEDANCE VALUE: 2500 Ohm — AB
MDC IDC MSMT LEADCHNL RV IMPEDANCE VALUE: 486 Ohm
MDC IDC PG SERIAL: 114945
MDC IDC SESS DTM: 20180607040000
MDC IDC SET LEADCHNL LV PACING AMPLITUDE: 2.4 V
MDC IDC SET LEADCHNL LV SENSING SENSITIVITY: 1 mV
MDC IDC SET LEADCHNL RV PACING PULSEWIDTH: 0.5 ms
MDC IDC SET LEADCHNL RV SENSING SENSITIVITY: 0.6 mV
MDC IDC STAT BRADY RV PERCENT PACED: 99 %

## 2016-08-07 LAB — POCT INR: INR: 2.3

## 2016-08-16 ENCOUNTER — Other Ambulatory Visit: Payer: Self-pay | Admitting: Endocrinology

## 2016-08-20 ENCOUNTER — Ambulatory Visit: Payer: Medicaid Other | Admitting: Endocrinology

## 2016-08-20 ENCOUNTER — Other Ambulatory Visit: Payer: Self-pay | Admitting: *Deleted

## 2016-08-20 ENCOUNTER — Other Ambulatory Visit: Payer: Self-pay | Admitting: Cardiovascular Disease

## 2016-08-20 MED ORDER — WARFARIN SODIUM 5 MG PO TABS: ORAL_TABLET | ORAL | 3 refills | Status: DC

## 2016-08-29 ENCOUNTER — Encounter: Payer: Self-pay | Admitting: Gastroenterology

## 2016-08-29 ENCOUNTER — Ambulatory Visit: Payer: Medicaid Other | Admitting: Gastroenterology

## 2016-08-29 ENCOUNTER — Telehealth: Payer: Self-pay | Admitting: Gastroenterology

## 2016-08-29 NOTE — Progress Notes (Deleted)
Subjective:    Patient ID: [name], female    DOB: 1960-07-28, 56 y.o.   MRN: [id number]  [name], MD   HPI   PT DENIES FEVER, CHILLS, HEMATOCHEZIA, HEMATEMESIS, nausea, vomiting, melena, diarrhea, CHEST PAIN, SHORTNESS OF BREATH,  CHANGE IN BOWEL IN HABITS, constipation, abdominal pain, problems swallowing, problems with sedation, heartburn or indigestion.  Past Medical History:  Diagnosis Date  . Automatic implantable cardioverter-defibrillator in situ    a. 03/2013: BSX Energen CRTD BiV ICC, ser# [id number]  . Chronic anticoagulation    a. coumadin.  . Chronic atrial fibrillation (HCC)    embolic rind  . Chronic kidney disease    creatinine- 1.8 in 09/2006 and 2.0 in 05/2007; 2.05 in 2011, 2.29 in 2012  . Chronic systolic CHF (congestive heart failure) ([place])    a. 03/2013 Echo: Sev LV dysfxn with sev diff HK, restrictive phys, diast dysfxn, mild MR, sev dil LA/RA, mod PR, PASP 58mmHg.  . Congenital third degree heart block    a. Guidant VVI pacemaker implanted in 09/1997; b. gen change in 01/2004;  c. 03/2013 upgrade to [place], ser# [id number].  . Dizziness and giddiness 05/19/2012  . Dysphagia 08/14/2011   FEB 2013 EGD/DIL 16 MM; GERD; h/o gastroesophageal reflux disease; + H. Pylori gastritis   . GERD (gastroesophageal reflux disease)   . Helicobacter pylori gastritis 08/14/2011  . IDDM (insulin dependent diabetes mellitus) ([place])   . Kidney stones 1990's  . Nonischemic cardiomyopathy ([place])    a. 03/2013 Echo: Sev LV dysfxn with sev diff HK, restrictive phys, diast dysfxn, mild MR, sev dil LA/RA, mod PR, PASP 39mmHg;  b. 03/2013: BSX Energen CRTD BiV ICC, ser# [id number].  Marland Kitchen Stroke [facility]) 1999   denies residual on 04/21/2013    Past Surgical History:  Procedure Laterality Date  . BI-VENTRICULAR IMPLANTABLE CARDIOVERTER DEFIBRILLATOR  (CRT-D)  04/21/2013  . BI-VENTRICULAR IMPLANTABLE CARDIOVERTER DEFIBRILLATOR UPGRADE N/A 04/21/2013   Procedure: BI-VENTRICULAR  IMPLANTABLE CARDIOVERTER DEFIBRILLATOR UPGRADE;  Surgeon: Evans Lance, MD;  Location: [facility] CATH LAB;  Service: Cardiovascular;  Laterality: N/A;  . COLONOSCOPY  04/23/2011   YQM:VHQIONGEXB COLON POLYP/ INTERNAL HEMORRHOIDS/ TORTUOUS COLON  . INSERT / REPLACE / REMOVE PACEMAKER  09/1997   Guidant VVI [employer]  . PACEMAKER GENERATOR CHANGE  01/2004  . UPPER GASTROINTESTINAL ENDOSCOPY  FEB 2013   RING/DIL TO 16 MM, H PYLORI GASTRITIS    Allergies  Allergen Reactions  . Wheat Swelling  . Latex Rash  . Penicillins Rash  . Sulfa Antibiotics Rash     Review of Systems PER HPI OTHERWISE ALL SYSTEMS ARE NEGATIVE.    Objective:   Physical Exam  Constitutional: She is oriented to person, place, and time. She appears well-developed and well-nourished. No distress.  HENT:  Head: Normocephalic and atraumatic.  Mouth/Throat: Oropharynx is clear and moist. No oropharyngeal exudate.  Eyes: Pupils are equal, round, and reactive to light. No scleral icterus.  Neck: Normal range of motion. Neck supple.  Cardiovascular: Normal rate, regular rhythm and normal heart sounds.   Pulmonary/Chest: Effort normal and breath sounds normal. No respiratory distress.  Abdominal: Soft. Bowel sounds are normal. She exhibits no distension. There is no tenderness.  Musculoskeletal: She exhibits no edema.  Lymphadenopathy:    She has no cervical adenopathy.  Neurological: She is alert and oriented to person, place, and time.  Psychiatric: She has a normal mood and affect.  Vitals reviewed.  Assessment & Plan:

## 2016-08-29 NOTE — Telephone Encounter (Signed)
Patient was a no show and letter sent  °

## 2016-08-30 ENCOUNTER — Telehealth: Payer: Self-pay | Admitting: Gastroenterology

## 2016-08-30 NOTE — Telephone Encounter (Signed)
REVIEWED-NO ADDITIONAL RECOMMENDATIONS. 

## 2016-08-30 NOTE — Telephone Encounter (Signed)
Pt was a NO SHOW for her OV yesterday with [facility] and called today to confirm her appointment. I told her it was yesterday and she missed it. She is aware of new OV with [facility] on 9/6 at 9 and she is on a cancellation list if someone was to cancel off of [facility] schedule. Pt also said that she never heard back regarding her labs. Please call her at [phone]

## 2016-09-02 NOTE — Telephone Encounter (Signed)
I called pt and reviewed her lab results. She is going to see a diabetic specialist soon.

## 2016-09-18 ENCOUNTER — Ambulatory Visit (INDEPENDENT_AMBULATORY_CARE_PROVIDER_SITE_OTHER): Payer: Medicaid Other | Admitting: *Deleted

## 2016-09-18 DIAGNOSIS — I4891 Unspecified atrial fibrillation: Secondary | ICD-10-CM

## 2016-09-18 DIAGNOSIS — Z5181 Encounter for therapeutic drug level monitoring: Secondary | ICD-10-CM

## 2016-09-18 LAB — POCT INR: INR: 2.4

## 2016-09-21 ENCOUNTER — Other Ambulatory Visit: Payer: Self-pay | Admitting: Endocrinology

## 2016-09-24 ENCOUNTER — Ambulatory Visit: Payer: Medicaid Other | Admitting: Endocrinology

## 2016-09-25 ENCOUNTER — Other Ambulatory Visit: Payer: Self-pay | Admitting: Endocrinology

## 2016-10-20 ENCOUNTER — Other Ambulatory Visit: Payer: Self-pay | Admitting: Endocrinology

## 2016-10-30 ENCOUNTER — Ambulatory Visit (INDEPENDENT_AMBULATORY_CARE_PROVIDER_SITE_OTHER): Payer: Medicaid Other | Admitting: *Deleted

## 2016-10-30 DIAGNOSIS — I4891 Unspecified atrial fibrillation: Secondary | ICD-10-CM | POA: Diagnosis not present

## 2016-10-30 DIAGNOSIS — Z5181 Encounter for therapeutic drug level monitoring: Secondary | ICD-10-CM

## 2016-10-30 LAB — POCT INR: INR: 4.2

## 2016-10-31 ENCOUNTER — Ambulatory Visit (INDEPENDENT_AMBULATORY_CARE_PROVIDER_SITE_OTHER): Payer: Medicaid Other | Admitting: Gastroenterology

## 2016-10-31 ENCOUNTER — Other Ambulatory Visit: Payer: Self-pay | Admitting: Internal Medicine

## 2016-10-31 ENCOUNTER — Ambulatory Visit (INDEPENDENT_AMBULATORY_CARE_PROVIDER_SITE_OTHER): Payer: Medicaid Other | Admitting: *Deleted

## 2016-10-31 ENCOUNTER — Other Ambulatory Visit: Payer: Self-pay | Admitting: Endocrinology

## 2016-10-31 DIAGNOSIS — B9681 Helicobacter pylori [H. pylori] as the cause of diseases classified elsewhere: Secondary | ICD-10-CM

## 2016-10-31 DIAGNOSIS — I428 Other cardiomyopathies: Secondary | ICD-10-CM

## 2016-10-31 DIAGNOSIS — D126 Benign neoplasm of colon, unspecified: Secondary | ICD-10-CM | POA: Diagnosis not present

## 2016-10-31 DIAGNOSIS — K2970 Gastritis, unspecified, without bleeding: Secondary | ICD-10-CM | POA: Diagnosis not present

## 2016-10-31 NOTE — Patient Instructions (Addendum)
Complete H pylori breath test.  YOUR CHILDREN NEED THE H PYLORI BREATH TEST AS WELL. THEY NEED TO AVOID ACID BLOCKERS FOR 2 WEEKS PRIOR TO THE TEST.  NEXT COLONOSCOPY IN FEB 2020.  FOLLOW UP IN 1 YEAR.

## 2016-10-31 NOTE — Addendum Note (Signed)
Addended by: Danie Binder on: 10/31/2016 11:05 AM   Modules accepted: Orders

## 2016-10-31 NOTE — Assessment & Plan Note (Signed)
TREATED IN 2013. NEED ERADICATION CONFIRMED.  Complete H pylori breath test. HER CHILDREN NEED THE H PYLORI BREATH TEST AS WELL. THEY NEED TO AVOID ACID BLOCKERS FOR 2 WEEKS PRIOR TO THE TEST. PT VOICED HER UNDERSTANDING. FOLLOW UP IN 1 YEAR.   GREATER THAN 50% WAS SPENT IN COUNSELING & COORDINATION OF CARE WITH THE PATIENT: DISCUSSED CAUSES, PROCEDURE, BENEFITS, RISKS, AND MANAGEMENT OF H PYLORI GASTRITIS AND COLON POLYPS. TOTAL ENCOUNTER TIME: 25 MINS.

## 2016-10-31 NOTE — Assessment & Plan Note (Signed)
ONE SIMPLE ADENOMAS REMOVED 2013. NO WARNING SIGNS/SYMPTOMS. NO BRBPR OR MELENA.  NEXT COLONOSCOPY IN FEB 2020.

## 2016-10-31 NOTE — Progress Notes (Signed)
Subjective:    Patient ID: [name], female    DOB: [birth year], 56 y.o.   MRN: [id number] [name], MD  HPI Bowels doing ok. No blood in stool. Bowels move every day. Eats fruits. NOT BEEN HOSPITALIZED THIS YEAR. LIKES TO GO TO [employer] AND NEW GRAND-BABY(1 MO OLD).TREATED FO RH PYLORI IN 2013 BUT NO DOCUMENTATION OF ERADICATION.  PT DENIES FEVER, CHILLS, HEMATOCHEZIA, HEMATEMESIS, nausea, vomiting, melena, diarrhea, CHEST PAIN, SHORTNESS OF BREATH, CHANGE IN BOWEL IN HABITS, constipation, abdominal pain, problems swallowing, OR heartburn or indigestion.  Past Medical History:  Diagnosis Date  . Automatic implantable cardioverter-defibrillator in situ    a. 03/2013: BSX Energen CRTD BiV ICC, ser# [id number]  . Chronic anticoagulation    a. coumadin.  . Chronic atrial fibrillation (HCC)    embolic rind  . Chronic kidney disease    creatinine- 1.8 in 09/2006 and 2.0 in 05/2007; 2.05 in 2011, 2.29 in 2012  . Chronic systolic CHF (congestive heart failure) ([place])    a. 03/2013 Echo: Sev LV dysfxn with sev diff HK, restrictive phys, diast dysfxn, mild MR, sev dil LA/RA, mod PR, PASP 88mmHg.  . Congenital third degree heart block    a. Guidant VVI pacemaker implanted in 09/1997; b. gen change in 01/2004;  c. 03/2013 upgrade to [place], ser# [id number].  . Dizziness and giddiness 05/19/2012  . Dysphagia 08/14/2011   FEB 2013 EGD/DIL 16 MM; GERD; h/o gastroesophageal reflux disease; + H. Pylori gastritis   . GERD (gastroesophageal reflux disease)   . Helicobacter pylori gastritis 08/14/2011  . IDDM (insulin dependent diabetes mellitus) ([place])   . Kidney stones 1990's  . Nonischemic cardiomyopathy ([place])    a. 03/2013 Echo: Sev LV dysfxn with sev diff HK, restrictive phys, diast dysfxn, mild MR, sev dil LA/RA, mod PR, PASP 30mmHg;  b. 03/2013: BSX Energen CRTD BiV ICC, ser# [id number].  Marland Kitchen Stroke [facility]) 1999   denies residual on 04/21/2013   Past Surgical History:  Procedure Laterality  Date  . BI-VENTRICULAR IMPLANTABLE CARDIOVERTER DEFIBRILLATOR  (CRT-D)  04/21/2013  . BI-VENTRICULAR IMPLANTABLE CARDIOVERTER DEFIBRILLATOR UPGRADE N/A 04/21/2013     . COLONOSCOPY  04/23/2011   ATF:TDDUKGURKY COLON POLYP/ INTERNAL HEMORRHOIDS/ TORTUOUS COLON  . INSERT / REPLACE / REMOVE PACEMAKER  09/1997   Guidant VVI [employer]  . PACEMAKER GENERATOR CHANGE  01/2004  . UPPER GASTROINTESTINAL ENDOSCOPY  FEB 2013   RING/DIL TO 16 MM, H PYLORI GASTRITIS    Allergies  Allergen Reactions  . Wheat Swelling  . Latex Rash  . Penicillins Rash  . Sulfa Antibiotics Rash   Current Outpatient Prescriptions  Medication Sig Dispense Refill  . ACCU-CHEK SOFTCLIX LANCETS lancets USE TO TEST BLOOD SUGAR FOUR TIMES DAILY AS DIRECTED.    Marland Kitchen acetaminophen (TYLENOL) 500 MG tablet Take 500 mg by mouth every 6 (six) hours as needed for moderate pain.    Marland Kitchen allopurinol (ZYLOPRIM) 100 MG tablet Take 150 mg by mouth every morning. Reported on 06/15/2015    . baclofen (LIORESAL) 10 MG tablet Take 1 tablet (10 mg total) by mouth 2 (two) times daily.    . benazepril (LOTENSIN) 20 MG tablet Take 10 mg by mouth every morning. Takes 1/2 tablet    . calcitRIOL (ROCALTROL) 0.25 MCG capsule Take 0.25 mcg by mouth daily.    Marland Kitchen COLCRYS 0.6 MG tablet Take 0.6 mg by mouth daily as needed (gout flare). Reported on 06/15/2015    . COREG CR 40 MG  24 hr capsule TAKE ONE CAPSULE BY MOUTH DAILY.    . furosemide (LASIX) 80 MG tablet TAKE (1) TABLET BY MOUTH TWICE DAILY.    Marland Kitchen HUMULIN N KWIKPEN 100 UNIT/ML Kiwkpen INJECT 34 UNITS SUBCUTANEOUSLY AT BEDTIME.    Marland Kitchen LANOXIN 125 MCG tablet TAKE 1 TABLET BY MOUTH DAILY EXCEPT ON SATURDAY AND SUNDAY.    Marland Kitchen LITETOUCH PEN NEEDLES 31G X 8 MM MISC USE AS DIRECTED UP TO 4 TIMES DAILY.    Marland Kitchen NOVOLOG FLEXPEN 100 UNIT/ML FlexPen INJECT SUBCUTANEOUSLY 12 UNITS BEFORE BREAKFAST; 14 UNITS AT LUNCH; AND 16 UNITS AT DINNER.    Marland Kitchen oxyCODONE-acetaminophen (PERCOCET) 5-325 MG tablet Take 1 tablet by mouth  every 4 (four) hours as needed for moderate pain.    Marland Kitchen spironolactone (ALDACTONE) 25 MG tablet Take 25 mg by mouth every morning.     . traMADol-acetaminophen (ULTRACET) 37.5-325 MG tablet Take 1 tablet by mouth every 6 (six) hours as needed. Reported on 06/15/2015    . TRESIBA FLEXTOUCH 200 UNIT/ML SOPN INJECT 56 UNITS INTO THE SKIN DAILY BEFORE BREAKFAST. (Patient taking differently: INJECT 48 UNITS INTO THE SKIN DAILY BEFORE BREAKFAST.)    . VICTOZA 18 MG/3ML SOPN INJECT 1.2MG  SUBCUTANEOUSLY DAILY AS DIRECTED.    Marland Kitchen warfarin (COUMADIN) 5 MG tablet Take 1/2 tablet daily except 1 tablet on Mondays and Fridays     Review of Systems PER HPI OTHERWISE ALL SYSTEMS ARE NEGATIVE.    Objective:   Physical Exam  Constitutional: She is oriented to person, place, and time. She appears well-developed and well-nourished. No distress.  HENT:  Head: Normocephalic and atraumatic.  Mouth/Throat: Oropharynx is clear and moist. No oropharyngeal exudate.  Eyes: Pupils are equal, round, and reactive to light. No scleral icterus.  Neck: Normal range of motion. Neck supple.  Cardiovascular: Normal rate and regular rhythm.   Murmur (SYSTOLIC) heard. Pulmonary/Chest: Effort normal and breath sounds normal. No respiratory distress.  Abdominal: Soft. Bowel sounds are normal. She exhibits no distension. There is no tenderness.  Musculoskeletal: She exhibits no edema.  MULTIPLE(PLE VARICOSITIES ON LOWER EXTERMITIES  Lymphadenopathy:    She has no cervical adenopathy.  Neurological: She is alert and oriented to person, place, and time.  NO  NEW FOCAL DEFICITS  Psychiatric: She has a normal mood and affect.  Vitals reviewed.     Assessment & Plan:

## 2016-11-01 NOTE — Progress Notes (Signed)
Remote ICD transmission.   

## 2016-11-05 ENCOUNTER — Encounter: Payer: Self-pay | Admitting: Cardiology

## 2016-11-06 ENCOUNTER — Other Ambulatory Visit: Payer: Self-pay | Admitting: Endocrinology

## 2016-11-08 ENCOUNTER — Telehealth: Payer: Self-pay

## 2016-11-08 ENCOUNTER — Ambulatory Visit: Payer: Medicaid Other | Admitting: Endocrinology

## 2016-11-08 NOTE — Telephone Encounter (Signed)
Pa initiated for [name] done through [facility] [employer]. Please contact them at [phone] with confirmation # [id number] to find out status of medication approval

## 2016-11-18 ENCOUNTER — Encounter: Payer: Self-pay | Admitting: *Deleted

## 2016-11-21 ENCOUNTER — Other Ambulatory Visit: Payer: Self-pay | Admitting: Endocrinology

## 2016-11-25 ENCOUNTER — Other Ambulatory Visit: Payer: Self-pay | Admitting: Endocrinology

## 2016-11-25 DIAGNOSIS — E1165 Type 2 diabetes mellitus with hyperglycemia: Principal | ICD-10-CM

## 2016-11-25 DIAGNOSIS — IMO0002 Reserved for concepts with insufficient information to code with codable children: Secondary | ICD-10-CM

## 2016-11-25 DIAGNOSIS — E1129 Type 2 diabetes mellitus with other diabetic kidney complication: Secondary | ICD-10-CM

## 2016-11-25 MED ORDER — INSULIN NPH (HUMAN) (ISOPHANE) 100 UNIT/ML ~~LOC~~ SUSP: SUBCUTANEOUS | 1 refills | Status: DC

## 2016-11-26 LAB — CUP PACEART REMOTE DEVICE CHECK
Battery Remaining Longevity: 78 mo
HIGH POWER IMPEDANCE MEASURED VALUE: 74 Ohm
Implantable Lead Implant Date: 20150225
Implantable Lead Location: 753858
Implantable Lead Location: 753860
Implantable Lead Model: 180
Implantable Pulse Generator Implant Date: 20150225
Lead Channel Pacing Threshold Amplitude: 0.4 V
Lead Channel Pacing Threshold Amplitude: 0.6 V
Lead Channel Pacing Threshold Pulse Width: 0.5 ms
Lead Channel Setting Pacing Amplitude: 2.4 V
Lead Channel Setting Pacing Amplitude: 2.4 V
Lead Channel Setting Pacing Pulse Width: 0.5 ms
Lead Channel Setting Sensing Sensitivity: 1 mV
MDC IDC LEAD IMPLANT DT: 20150225
MDC IDC LEAD SERIAL: 311545
MDC IDC MSMT BATTERY REMAINING PERCENTAGE: 100 %
MDC IDC MSMT LEADCHNL LV IMPEDANCE VALUE: 440 Ohm
MDC IDC MSMT LEADCHNL LV PACING THRESHOLD PULSEWIDTH: 0.5 ms
MDC IDC MSMT LEADCHNL RV IMPEDANCE VALUE: 490 Ohm
MDC IDC PG SERIAL: 114945
MDC IDC SESS DTM: 20180906055000
MDC IDC SET LEADCHNL RV PACING PULSEWIDTH: 0.5 ms
MDC IDC SET LEADCHNL RV SENSING SENSITIVITY: 0.6 mV
MDC IDC STAT BRADY RA PERCENT PACED: 0 %
MDC IDC STAT BRADY RV PERCENT PACED: 100 %

## 2016-11-28 ENCOUNTER — Other Ambulatory Visit: Payer: Self-pay

## 2016-11-28 MED ORDER — INSULIN NPH (HUMAN) (ISOPHANE) 100 UNIT/ML ~~LOC~~ SUSP: SUBCUTANEOUS | 3 refills | Status: DC

## 2016-11-28 NOTE — Telephone Encounter (Signed)
Please send the  prescription for the Novolin N in a bottle

## 2016-11-28 NOTE — Telephone Encounter (Signed)
Spoke to [place] at [facility] and she stated the patient was denied for the [place] because she has to try and fail the vial first- do you want me to order this please advise

## 2016-11-28 NOTE — Telephone Encounter (Signed)
This has been ordered 

## 2016-12-02 ENCOUNTER — Encounter: Payer: Self-pay | Admitting: *Deleted

## 2016-12-11 ENCOUNTER — Ambulatory Visit (INDEPENDENT_AMBULATORY_CARE_PROVIDER_SITE_OTHER): Payer: Medicaid Other | Admitting: *Deleted

## 2016-12-11 DIAGNOSIS — Z5181 Encounter for therapeutic drug level monitoring: Secondary | ICD-10-CM

## 2016-12-11 DIAGNOSIS — I4891 Unspecified atrial fibrillation: Secondary | ICD-10-CM

## 2016-12-11 LAB — POCT INR: INR: 5.6

## 2016-12-13 ENCOUNTER — Ambulatory Visit: Payer: Medicaid Other | Admitting: Endocrinology

## 2016-12-14 ENCOUNTER — Other Ambulatory Visit: Payer: Self-pay | Admitting: Endocrinology

## 2016-12-16 ENCOUNTER — Telehealth: Payer: Self-pay | Admitting: Endocrinology

## 2016-12-16 NOTE — Telephone Encounter (Signed)
Can you get this one for me? She needs asap. Thank you!!

## 2016-12-16 NOTE — Telephone Encounter (Signed)
Placed on Megans desk, to give to Dr.Kumar to sign.  Thanks!

## 2016-12-16 NOTE — Telephone Encounter (Signed)
Patient is calling on the status of her PA for humulin N, please advise he is almost out.

## 2016-12-18 ENCOUNTER — Encounter: Payer: Self-pay | Admitting: *Deleted

## 2016-12-23 ENCOUNTER — Ambulatory Visit (INDEPENDENT_AMBULATORY_CARE_PROVIDER_SITE_OTHER): Payer: Medicaid Other | Admitting: *Deleted

## 2016-12-23 ENCOUNTER — Other Ambulatory Visit: Payer: Self-pay

## 2016-12-23 DIAGNOSIS — I4891 Unspecified atrial fibrillation: Secondary | ICD-10-CM

## 2016-12-23 DIAGNOSIS — Z5181 Encounter for therapeutic drug level monitoring: Secondary | ICD-10-CM | POA: Diagnosis not present

## 2016-12-23 LAB — POCT INR: INR: 1.7

## 2016-12-23 MED ORDER — LIRAGLUTIDE 18 MG/3ML ~~LOC~~ SOPN: PEN_INJECTOR | SUBCUTANEOUS | 0 refills | Status: DC

## 2016-12-24 ENCOUNTER — Other Ambulatory Visit: Payer: Self-pay

## 2016-12-24 ENCOUNTER — Other Ambulatory Visit: Payer: Self-pay | Admitting: Cardiovascular Disease

## 2016-12-24 ENCOUNTER — Telehealth: Payer: Self-pay

## 2016-12-24 MED ORDER — INSULIN ISOPHANE HUMAN 100 UNIT/ML KWIKPEN: PEN_INJECTOR | SUBCUTANEOUS | 3 refills | Status: DC

## 2016-12-24 NOTE — Telephone Encounter (Signed)
Was able to get Humulin N kwikpen covered through [employer] today the PA number is [id number] and it is covered until 12/19/17- I sent in a new prescription with refills and the patient has been notified

## 2017-01-01 ENCOUNTER — Ambulatory Visit: Payer: Medicaid Other | Admitting: Endocrinology

## 2017-01-01 ENCOUNTER — Encounter: Payer: Self-pay | Admitting: Endocrinology

## 2017-01-01 VITALS — BP 132/78 | HR 60 | Ht 66.0 in | Wt 186.0 lb

## 2017-01-01 DIAGNOSIS — E1165 Type 2 diabetes mellitus with hyperglycemia: Secondary | ICD-10-CM | POA: Diagnosis not present

## 2017-01-01 DIAGNOSIS — Z794 Long term (current) use of insulin: Secondary | ICD-10-CM

## 2017-01-01 DIAGNOSIS — N184 Chronic kidney disease, stage 4 (severe): Secondary | ICD-10-CM | POA: Diagnosis not present

## 2017-01-01 LAB — POCT GLYCOSYLATED HEMOGLOBIN (HGB A1C): Hemoglobin A1C: 7.9

## 2017-01-01 NOTE — Progress Notes (Signed)
Patient ID: [name], female   DOB: 04-Feb-1961, 56 y.o.   MRN: [id number]   Reason for Appointment : Follow up for Type 1 Diabetes  History of Present Illness           Diagnosis: Type 2 diabetes mellitus, date of diagnosis: 1990        Past history: She has been on insulin since 2007. Generally has had poor control in requiring large doses of insulin. Blood sugars also have been previously variable making her control difficult. She has had difficulty following instructions for mealtime insulin consistently in the past  Has been on a multiple injection regimen of Lantus twice a day, NovoLog a.c. and NPH at bedtime A1c has been as high as 11.3 previously   Recent history: INSULIN regimen is described as: [place] 48 Humulin N 34 hs, NovoLog a.c.12- 14-16 before meals  He has not been seen in follow-up since March  Although her A1c is below 8% it is not improving and still 7.9 today  Glucose patterns , current management and problems identified:  She has been checking blood sugars only fasting lately and only sporadically later in the day with variable results  She was told to take the consistent amount of [place] Aas is not adjusting based on bedtime reading and she is doing this  Her fasting readings are variable and not clear why  She may not always take her NOVOLOG before eating and sometimes up to 30 minutes later  No hypoglycemia noted although she thinks occasionally may feel pressure quitting low  She is still not clear on how she needs to adjust her insulin based on what she is eating  Has been taking her Victoza as directed  Glucose monitoring:  done  1-2 times a day         Glucometer:  Accu-Chek [employer]iva       Blood Glucose readings from meter download:   Mean values apply above for all meters except median for One Touch  PRE-MEAL Fasting Lunch Dinner Bedtime Overall  Glucose range: 73-254 95  211, 243  1 35-1 99    Mean/median: 152    147   Physical  activity: exercise: Walking some   Self-care: The diet that the patient has been following is low fat,  Lunch : Sometimes salad otherwise sandwiches  Meals: 3 meals per day.  breakfast is 9 am at lunch 12 pm Supper at about 6-7 PM, mealtimes are variable           Dietician visit: Most recent:.Several years ago  Last CD consult in 10/2013            Wt Readings from Last 3 Encounters:  01/01/17 186 lb (84.4 kg)  10/31/16 184 lb (83.5 kg)  08/01/16 185 lb (83.9 kg)   A1c results  Lab Results  Component Value Date   HGBA1C 7.7 05/20/2016   HGBA1C 7.8 01/17/2016   HGBA1C 8.6 10/25/2015   Lab Results  Component Value Date   MICROALBUR 1.9 10/14/2014   LDLCALC 93 01/23/2015   CREATININE 2.47 (H) 08/02/2016     Lab Results  Component Value Date   MICROALBUR 1.9 10/14/2014     Allergies as of 01/01/2017      Reactions   Wheat Swelling   Latex Rash   Penicillins Rash   Sulfa Antibiotics Rash      Medication List        Accurate as of 01/01/17  3:18 PM. Always  use your most recent med list.          ACCU-CHEK SOFTCLIX LANCETS lancets USE TO TEST BLOOD SUGAR FOUR TIMES DAILY AS DIRECTED.   acetaminophen 500 MG tablet Commonly known as:  TYLENOL Take 500 mg by mouth every 6 (six) hours as needed for moderate pain.   allopurinol 100 MG tablet Commonly known as:  ZYLOPRIM Take 150 mg by mouth every morning. Reported on 06/15/2015   baclofen 10 MG tablet Commonly known as:  LIORESAL Take 1 tablet (10 mg total) by mouth 2 (two) times daily.   benazepril 20 MG tablet Commonly known as:  LOTENSIN Take 10 mg by mouth every morning. Takes 1/2 tablet   calcitRIOL 0.25 MCG capsule Commonly known as:  ROCALTROL Take 0.25 mcg by mouth daily.   COLCRYS 0.6 MG tablet Generic drug:  colchicine Take 0.6 mg by mouth daily as needed (gout flare). Reported on 06/15/2015   COREG CR 40 MG 24 hr capsule Generic drug:  carvedilol TAKE ONE CAPSULE BY MOUTH DAILY.     furosemide 80 MG tablet Commonly known as:  LASIX TAKE (1) TABLET BY MOUTH TWICE DAILY.   Insulin NPH (Human) ([place]) 100 UNIT/ML Kiwkpen Commonly known as:  HUMULIN N KWIKPEN INJECT 34 UNITS SUBCUTANEOUSLY AT BEDTIME.   LANOXIN 0.125 MG tablet Generic drug:  digoxin TAKE 1 TABLET BY MOUTH DAILY EXCEPT ON SATURDAY AND SUNDAY.   liraglutide 18 MG/3ML Sopn Commonly known as:  VICTOZA INJECT 1.2MG  SUBCUTANEOUSLY DAILY AS DIRECTED.   LITETOUCH PEN NEEDLES 31G X 8 MM Misc Generic drug:  Insulin Pen Needle USE AS DIRECTED UP TO 4 TIMES DAILY.   NOVOLOG FLEXPEN 100 UNIT/ML FlexPen Generic drug:  insulin aspart INJECT SUBCUTANEOUSLY 12 UNITS BEFORE BREAKFAST; 14 UNITS AT LUNCH; AND 16 UNITS AT DINNER.   oxyCODONE-acetaminophen 5-325 MG tablet Commonly known as:  PERCOCET Take 1 tablet by mouth every 4 (four) hours as needed for moderate pain.   spironolactone 25 MG tablet Commonly known as:  ALDACTONE Take 25 mg by mouth every morning.   traMADol-acetaminophen 37.5-325 MG tablet Commonly known as:  ULTRACET Take 1 tablet by mouth every 6 (six) hours as needed. Reported on 06/15/2015   TRESIBA FLEXTOUCH 200 UNIT/ML Sopn Generic drug:  Insulin Degludec INJECT 56 UNITS INTO THE SKIN DAILY BEFORE BREAKFAST.   warfarin 5 MG tablet Commonly known as:  COUMADIN Take 1/2 tablet daily except 1 tablet on Mondays and Thursdays       Allergies:  Allergies  Allergen Reactions  . Wheat Swelling  . Latex Rash  . Penicillins Rash  . Sulfa Antibiotics Rash    Past Medical History:  Diagnosis Date  . Automatic implantable cardioverter-defibrillator in situ    a. 03/2013: BSX Energen CRTD BiV ICC, ser# [id number]  . Chronic anticoagulation    a. coumadin.  . Chronic atrial fibrillation (HCC)    embolic rind  . Chronic kidney disease    creatinine- 1.8 in 09/2006 and 2.0 in 05/2007; 2.05 in 2011, 2.29 in 2012  . Chronic systolic CHF (congestive heart failure) ([place])    a. 03/2013  Echo: Sev LV dysfxn with sev diff HK, restrictive phys, diast dysfxn, mild MR, sev dil LA/RA, mod PR, PASP 75mmHg.  . Congenital third degree heart block    a. Guidant VVI pacemaker implanted in 09/1997; b. gen change in 01/2004;  c. 03/2013 upgrade to [place], ser# [id number].  . Dizziness and giddiness 05/19/2012  . Dysphagia 08/14/2011  FEB 2013 EGD/DIL 16 MM; GERD; h/o gastroesophageal reflux disease; + H. Pylori gastritis   . GERD (gastroesophageal reflux disease)   . Helicobacter pylori gastritis 08/14/2011  . IDDM (insulin dependent diabetes mellitus) ([place])   . Kidney stones 1990's  . Nonischemic cardiomyopathy ([place])    a. 03/2013 Echo: Sev LV dysfxn with sev diff HK, restrictive phys, diast dysfxn, mild MR, sev dil LA/RA, mod PR, PASP 70mmHg;  b. 03/2013: BSX Energen CRTD BiV ICC, ser# [id number].  Marland Kitchen Stroke [facility]) 1999   denies residual on 04/21/2013    Past Surgical History:  Procedure Laterality Date  . BI-VENTRICULAR IMPLANTABLE CARDIOVERTER DEFIBRILLATOR  (CRT-D)  04/21/2013  . COLONOSCOPY  04/23/2011   [id number] COLON POLYP/ INTERNAL HEMORRHOIDS/ TORTUOUS COLON  . INSERT / REPLACE / REMOVE PACEMAKER  09/1997   Guidant VVI [employer]  . PACEMAKER GENERATOR CHANGE  01/2004  . UPPER GASTROINTESTINAL ENDOSCOPY  FEB 2013   RING/DIL TO 16 MM, H PYLORI GASTRITIS    Family History  Adopted: Yes  Problem Relation Age of Onset  . Colon cancer Neg Hx   . Colon polyps Neg Hx     Social History:  reports that  has never smoked. she has never used smokeless tobacco. She reports that she does not drink alcohol or use drugs.    Review of Systems:   She has had stable chronic renal insufficiency, followed by nephrologist.  On calcitriol for secondary hyperparathyroidism No history of microalbuminuria   Lab Results  Component Value Date   CREATININE 2.47 (H) 08/02/2016   BUN 56 (H) 08/02/2016   NA 138 08/02/2016   K 5.3 08/02/2016   CL 103 08/02/2016   CO2 22  08/02/2016    She has had cardiomyopathy and History of atrial fibrillation followed by cardiologist  Lipids: Has dyslipidemia with low HDL, high triglycerides, not on any treatment  Lab Results  Component Value Date   CHOL 131 09/22/2015   HDL 23.30 (L) 09/22/2015   LDLCALC 93 01/23/2015   LDLDIRECT 65.0 09/22/2015   TRIG 247.0 (H) 09/22/2015   CHOLHDL 6 09/22/2015    Foot exam in 1/18 normal except absent pedal pulses on the right  She thinks she had vascular studies done by her cardiologist and reportedly normal   Physical Examination:  BP 132/78   Pulse 60   Ht 5\' 6"  (1.676 m)   Wt 186 lb (84.4 kg)   SpO2 98%   BMI 30.02 kg/m   ASSESSMENT:  Diabetes type 1:  See history of present illness for detailed discussion of her current blood sugar patterns, treatment regimen, meal planning and problems identified  She has been inconsistent with follow-up A1c is again high at 7.9  Currently difficult to assess her blood sugar patterns as she is checking mostly fasting readings and these are quite variable Not clear why she does have variability Her blood sugars maybe occasionally higher later in the day but has only about 2 or 3 readings after lunch No significant hypoglycemia and she is still continues to require relatively large doses of insulin   CKD: Followed by nephrologist and will recheck creatinine on the next visit  PLAN:     She will start checking her blood sugars 2-3 times a day regularly  Discussed options for insulin pumps as a better way of controlling her diabetes and discussed in detail how these work and for simplicity may do best with the Omnipod and showed her how this works,: She is not  clear if she wants to do this now and all questions were answered  She may need to get a C-peptide also done before this is authorized  Again she was told to keep a record of what she is eating for all meals for 3 days and keep a log of her pre-and post blood  sugar along with insulin dose and take this to the nurse educator.  Discussed that this will help her cover her meals more appropriately and avoid hypoglycemia also  She may benefit from continuous glucose monitoring but currently with her frequency of monitoring may not be eligible through her insurance  Follow-up in 2 months  Patient Instructions  Test 3x daily  Check blood sugars on waking up  4/7   Also check blood sugars about 2 hours after a meal and do this after different meals by rotation  Recommended blood sugar levels on waking up is 90-130 and about 2 hours after meal is 130-160  Please bring your blood sugar monitor to each visit, thank you  Take Novolog before each meal      Counseling time on subjects discussed above is over 50% of today's 25 minute visit    Zaydon Kinser 01/01/2017, 3:18 PM

## 2017-01-01 NOTE — Patient Instructions (Signed)
Test 3x daily  Check blood sugars on waking up  4/7   Also check blood sugars about 2 hours after a meal and do this after different meals by rotation  Recommended blood sugar levels on waking up is 90-130 and about 2 hours after meal is 130-160  Please bring your blood sugar monitor to each visit, thank you  Take Novolog before each meal

## 2017-01-01 NOTE — Progress Notes (Signed)
Patient ID: [name], female   DOB: Jul 26, 1960, 56 y.o.   MRN: [id number]   Reason for Appointment : Follow up for Type 1 Diabetes  History of Present Illness           Diagnosis: Type 2 diabetes mellitus, date of diagnosis: 1990        Past history: She has been on insulin since 2007. Generally has had poor control in requiring large doses of insulin. Blood sugars also have been previously variable making her control difficult. She has had difficulty following instructions for mealtime insulin consistently in the past  Has been on a multiple injection regimen of Lantus twice a day, NovoLog a.c. and NPH at bedtime A1c has been as high as 11.3 previously   Recent history: INSULIN regimen is described as: [place] 48 Humulin N 34 hs, NovoLog a.c.12- 14-16 before meals  He has not been seen in follow-up since March  Although her A1c is below 8% it is not improving and still 7.9 today  Glucose patterns , current management and problems identified:  She has been checking blood sugars only fasting lately and only sporadically later in the day with variable results  She was told to take the consistent amount of [place] Aas is not adjusting based on bedtime reading and she is doing this  Her fasting readings are variable and not clear why  She may not always take her NOVOLOG before eating and sometimes up to 30 minutes later  No hypoglycemia noted although she thinks occasionally may feel pressure quitting low  She is still not clear on how she needs to adjust her insulin based on what she is eating  Has been taking her Victoza as directed  Glucose monitoring:  done  1-2 times a day         Glucometer:  Accu-Chek [employer]iva       Blood Glucose readings from meter download:   Mean values apply above for all meters except median for One Touch  PRE-MEAL Fasting Lunch Dinner Bedtime Overall  Glucose range: 73-254 95  211, 243  1 35-1 99    Mean/median: 152    147   Physical  activity: exercise: Walking some   Self-care: The diet that the patient has been following is low fat,  Lunch : Sometimes salad otherwise sandwiches  Meals: 3 meals per day.  breakfast is 9 am at lunch 12 pm Supper at about 6-7 PM, mealtimes are variable           Dietician visit: Most recent:.Several years ago  Last CD consult in 10/2013            Wt Readings from Last 3 Encounters:  01/01/17 186 lb (84.4 kg)  10/31/16 184 lb (83.5 kg)  08/01/16 185 lb (83.9 kg)   A1c results  Lab Results  Component Value Date   HGBA1C 7.7 05/20/2016   HGBA1C 7.8 01/17/2016   HGBA1C 8.6 10/25/2015   Lab Results  Component Value Date   MICROALBUR 1.9 10/14/2014   LDLCALC 93 01/23/2015   CREATININE 2.47 (H) 08/02/2016     Lab Results  Component Value Date   MICROALBUR 1.9 10/14/2014     Allergies as of 01/01/2017      Reactions   Wheat Swelling   Latex Rash   Penicillins Rash   Sulfa Antibiotics Rash      Medication List        Accurate as of 01/01/17  1:54 PM. Always  use your most recent med list.          ACCU-CHEK SOFTCLIX LANCETS lancets USE TO TEST BLOOD SUGAR FOUR TIMES DAILY AS DIRECTED.   acetaminophen 500 MG tablet Commonly known as:  TYLENOL Take 500 mg by mouth every 6 (six) hours as needed for moderate pain.   allopurinol 100 MG tablet Commonly known as:  ZYLOPRIM Take 150 mg by mouth every morning. Reported on 06/15/2015   baclofen 10 MG tablet Commonly known as:  LIORESAL Take 1 tablet (10 mg total) by mouth 2 (two) times daily.   benazepril 20 MG tablet Commonly known as:  LOTENSIN Take 10 mg by mouth every morning. Takes 1/2 tablet   calcitRIOL 0.25 MCG capsule Commonly known as:  ROCALTROL Take 0.25 mcg by mouth daily.   COLCRYS 0.6 MG tablet Generic drug:  colchicine Take 0.6 mg by mouth daily as needed (gout flare). Reported on 06/15/2015   COREG CR 40 MG 24 hr capsule Generic drug:  carvedilol TAKE ONE CAPSULE BY MOUTH DAILY.     furosemide 80 MG tablet Commonly known as:  LASIX TAKE (1) TABLET BY MOUTH TWICE DAILY.   Insulin NPH (Human) (Isophane) 100 UNIT/ML Kiwkpen Commonly known as:  HUMULIN N KWIKPEN INJECT 34 UNITS SUBCUTANEOUSLY AT BEDTIME.   LANOXIN 0.125 MG tablet Generic drug:  digoxin TAKE 1 TABLET BY MOUTH DAILY EXCEPT ON SATURDAY AND SUNDAY.   liraglutide 18 MG/3ML Sopn Commonly known as:  VICTOZA INJECT 1.2MG  SUBCUTANEOUSLY DAILY AS DIRECTED.   LITETOUCH PEN NEEDLES 31G X 8 MM Misc Generic drug:  Insulin Pen Needle USE AS DIRECTED UP TO 4 TIMES DAILY.   NOVOLOG FLEXPEN 100 UNIT/ML FlexPen Generic drug:  insulin aspart INJECT SUBCUTANEOUSLY 12 UNITS BEFORE BREAKFAST; 14 UNITS AT LUNCH; AND 16 UNITS AT DINNER.   oxyCODONE-acetaminophen 5-325 MG tablet Commonly known as:  PERCOCET Take 1 tablet by mouth every 4 (four) hours as needed for moderate pain.   spironolactone 25 MG tablet Commonly known as:  ALDACTONE Take 25 mg by mouth every morning.   traMADol-acetaminophen 37.5-325 MG tablet Commonly known as:  ULTRACET Take 1 tablet by mouth every 6 (six) hours as needed. Reported on 06/15/2015   TRESIBA FLEXTOUCH 200 UNIT/ML Sopn Generic drug:  Insulin Degludec INJECT 56 UNITS INTO THE SKIN DAILY BEFORE BREAKFAST.   warfarin 5 MG tablet Commonly known as:  COUMADIN Take 1/2 tablet daily except 1 tablet on Mondays and Thursdays       Allergies:  Allergies  Allergen Reactions  . Wheat Swelling  . Latex Rash  . Penicillins Rash  . Sulfa Antibiotics Rash    Past Medical History:  Diagnosis Date  . Automatic implantable cardioverter-defibrillator in situ    a. 03/2013: BSX Energen CRTD BiV ICC, ser# [id number]  . Chronic anticoagulation    a. coumadin.  . Chronic atrial fibrillation (HCC)    embolic rind  . Chronic kidney disease    creatinine- 1.8 in 09/2006 and 2.0 in 05/2007; 2.05 in 2011, 2.29 in 2012  . Chronic systolic CHF (congestive heart failure) ([place])    a. 03/2013  Echo: Sev LV dysfxn with sev diff HK, restrictive phys, diast dysfxn, mild MR, sev dil LA/RA, mod PR, PASP 16mmHg.  . Congenital third degree heart block    a. Guidant VVI pacemaker implanted in 09/1997; b. gen change in 01/2004;  c. 03/2013 upgrade to [place], ser# [id number].  . Dizziness and giddiness 05/19/2012  . Dysphagia 08/14/2011  FEB 2013 EGD/DIL 16 MM; GERD; h/o gastroesophageal reflux disease; + H. Pylori gastritis   . GERD (gastroesophageal reflux disease)   . Helicobacter pylori gastritis 08/14/2011  . IDDM (insulin dependent diabetes mellitus) ([place])   . Kidney stones 1990's  . Nonischemic cardiomyopathy ([place])    a. 03/2013 Echo: Sev LV dysfxn with sev diff HK, restrictive phys, diast dysfxn, mild MR, sev dil LA/RA, mod PR, PASP 43mmHg;  b. 03/2013: BSX Energen CRTD BiV ICC, ser# [id number].  Marland Kitchen Stroke [facility]) 1999   denies residual on 04/21/2013    Past Surgical History:  Procedure Laterality Date  . BI-VENTRICULAR IMPLANTABLE CARDIOVERTER DEFIBRILLATOR  (CRT-D)  04/21/2013  . COLONOSCOPY  04/23/2011   YNW:[id number] COLON POLYP/ INTERNAL HEMORRHOIDS/ TORTUOUS COLON  . INSERT / REPLACE / REMOVE PACEMAKER  09/1997   Guidant VVI [employer]  . PACEMAKER GENERATOR CHANGE  01/2004  . UPPER GASTROINTESTINAL ENDOSCOPY  FEB 2013   RING/DIL TO 16 MM, H PYLORI GASTRITIS    Family History  Adopted: Yes  Problem Relation Age of Onset  . Colon cancer Neg Hx   . Colon polyps Neg Hx     Social History:  reports that  has never smoked. she has never used smokeless tobacco. She reports that she does not drink alcohol or use drugs.    Review of Systems:   She has had stable chronic renal insufficiency, followed by nephrologist.  On calcitriol for secondary hyperparathyroidism No history of microalbuminuria   Lab Results  Component Value Date   CREATININE 2.47 (H) 08/02/2016   BUN 56 (H) 08/02/2016   NA 138 08/02/2016   K 5.3 08/02/2016   CL 103 08/02/2016   CO2 22  08/02/2016    She has had cardiomyopathy and History of atrial fibrillation followed by cardiologist  Lipids: Has dyslipidemia with low HDL, high triglycerides, not on any treatment  Lab Results  Component Value Date   CHOL 131 09/22/2015   HDL 23.30 (L) 09/22/2015   LDLCALC 93 01/23/2015   LDLDIRECT 65.0 09/22/2015   TRIG 247.0 (H) 09/22/2015   CHOLHDL 6 09/22/2015    Foot exam in 1/18 normal except absent pedal pulses on the right  She thinks she had vascular studies done by her cardiologist and reportedly normal   Physical Examination:  BP 132/78   Pulse 60   Ht 5\' 6"  (1.676 m)   Wt 186 lb (84.4 kg)   SpO2 98%   BMI 30.02 kg/m   ASSESSMENT:  Diabetes type 1:  See history of present illness for detailed discussion of her current blood sugar patterns, treatment regimen, meal planning and problems identified  She has been inconsistent with follow-up A1c is again high at 7.9  Currently difficult to assess her blood sugar patterns as she is checking mostly fasting readings and these are quite variable Not clear why she does have variability Her blood sugars maybe occasionally higher later in the day but has only about 2 or 3 readings after lunch No significant hypoglycemia and she is still continues to require relatively large doses of insulin   PLAN:     She will start checking her blood sugars 2-3 times a day regularly  Discussed options for insulin pumps as a better way of controlling her diabetes and discussed in detail how these work and for simplicity may do best with the Omnipod and showed her how this works,: She is not clear if she wants to do this now and all questions were answered  She may need to get a C-peptide also done before this is authorized  Again she was told to keep a record of what she is eating for all meals for 3 days and keep a log of her pre-and post blood sugar along with insulin dose and take this to the nurse educator  Follow-up in  2 months  Patient Instructions  Test 3x daily  Check blood sugars on waking up  4/7   Also check blood sugars about 2 hours after a meal and do this after different meals by rotation  Recommended blood sugar levels on waking up is 90-130 and about 2 hours after meal is 130-160  Please bring your blood sugar monitor to each visit, thank you  Take Novolog before each meal      Counseling time on subjects discussed above is over 50% of today's 25 minute visit    Mannie Wineland 01/01/2017, 1:54 PM

## 2017-01-06 ENCOUNTER — Ambulatory Visit (INDEPENDENT_AMBULATORY_CARE_PROVIDER_SITE_OTHER): Payer: Medicaid Other | Admitting: *Deleted

## 2017-01-06 DIAGNOSIS — Z5181 Encounter for therapeutic drug level monitoring: Secondary | ICD-10-CM

## 2017-01-06 DIAGNOSIS — I4891 Unspecified atrial fibrillation: Secondary | ICD-10-CM | POA: Diagnosis not present

## 2017-01-06 LAB — POCT INR: INR: 5.4

## 2017-01-20 ENCOUNTER — Other Ambulatory Visit: Payer: Self-pay | Admitting: Endocrinology

## 2017-01-20 ENCOUNTER — Ambulatory Visit (INDEPENDENT_AMBULATORY_CARE_PROVIDER_SITE_OTHER): Payer: Medicaid Other | Admitting: *Deleted

## 2017-01-20 DIAGNOSIS — I4891 Unspecified atrial fibrillation: Secondary | ICD-10-CM | POA: Diagnosis not present

## 2017-01-20 DIAGNOSIS — Z5181 Encounter for therapeutic drug level monitoring: Secondary | ICD-10-CM | POA: Diagnosis not present

## 2017-01-20 LAB — POCT INR: INR: 2.8

## 2017-01-30 ENCOUNTER — Ambulatory Visit (INDEPENDENT_AMBULATORY_CARE_PROVIDER_SITE_OTHER): Payer: Medicaid Other | Admitting: *Deleted

## 2017-01-30 DIAGNOSIS — I428 Other cardiomyopathies: Secondary | ICD-10-CM | POA: Diagnosis not present

## 2017-01-30 NOTE — Progress Notes (Signed)
Remote ICD transmission.   

## 2017-02-04 LAB — CUP PACEART REMOTE DEVICE CHECK
Battery Remaining Longevity: 78 mo
Brady Statistic RV Percent Paced: 98 %
HighPow Impedance: 71 Ohm
Implantable Lead Location: 753858
Implantable Lead Location: 753860
Implantable Pulse Generator Implant Date: 20150225
Lead Channel Impedance Value: 501 Ohm
Lead Channel Pacing Threshold Amplitude: 0.4 V
Lead Channel Pacing Threshold Amplitude: 0.6 V
Lead Channel Setting Pacing Amplitude: 2.4 V
Lead Channel Setting Pacing Pulse Width: 0.5 ms
Lead Channel Setting Pacing Pulse Width: 0.5 ms
Lead Channel Setting Sensing Sensitivity: 1 mV
MDC IDC LEAD IMPLANT DT: 20150225
MDC IDC LEAD IMPLANT DT: 20150225
MDC IDC LEAD SERIAL: 311545
MDC IDC MSMT BATTERY REMAINING PERCENTAGE: 100 %
MDC IDC MSMT LEADCHNL LV IMPEDANCE VALUE: 442 Ohm
MDC IDC MSMT LEADCHNL LV PACING THRESHOLD PULSEWIDTH: 0.5 ms
MDC IDC MSMT LEADCHNL RV PACING THRESHOLD PULSEWIDTH: 0.5 ms
MDC IDC PG SERIAL: 114945
MDC IDC SESS DTM: 20181206072400
MDC IDC SET LEADCHNL LV PACING AMPLITUDE: 2.4 V
MDC IDC SET LEADCHNL RV SENSING SENSITIVITY: 0.6 mV
MDC IDC STAT BRADY RA PERCENT PACED: 0 %

## 2017-02-07 ENCOUNTER — Encounter: Payer: Self-pay | Admitting: Cardiology

## 2017-02-10 ENCOUNTER — Encounter: Payer: Self-pay | Admitting: *Deleted

## 2017-02-21 ENCOUNTER — Ambulatory Visit (INDEPENDENT_AMBULATORY_CARE_PROVIDER_SITE_OTHER): Payer: Self-pay | Admitting: *Deleted

## 2017-02-21 DIAGNOSIS — I4891 Unspecified atrial fibrillation: Secondary | ICD-10-CM

## 2017-02-21 DIAGNOSIS — Z5181 Encounter for therapeutic drug level monitoring: Secondary | ICD-10-CM

## 2017-02-21 LAB — POCT INR: INR: 3.3

## 2017-02-21 NOTE — Patient Instructions (Signed)
Hold coumadin tonight then resume 1/2 tablet daily except 1 tablet on Mondays   Continue 2 servings each week of leafy green vegetables.  Recheck in 3 weeks

## 2017-03-01 ENCOUNTER — Other Ambulatory Visit: Payer: Self-pay | Admitting: Endocrinology

## 2017-03-03 ENCOUNTER — Emergency Department (HOSPITAL_COMMUNITY): Payer: Medicaid Other

## 2017-03-03 ENCOUNTER — Encounter (HOSPITAL_COMMUNITY): Payer: Self-pay

## 2017-03-03 ENCOUNTER — Telehealth: Payer: Self-pay | Admitting: Gastroenterology

## 2017-03-03 ENCOUNTER — Emergency Department (HOSPITAL_COMMUNITY)
Admission: EM | Admit: 2017-03-03 | Discharge: 2017-03-03 | Disposition: A | Discharge status: Home / Self Care | Payer: Medicaid Other | Attending: Emergency Medicine | Admitting: Emergency Medicine

## 2017-03-03 DIAGNOSIS — E8770 Fluid overload, unspecified: Secondary | ICD-10-CM

## 2017-03-03 DIAGNOSIS — K8020 Calculus of gallbladder without cholecystitis without obstruction: Secondary | ICD-10-CM | POA: Diagnosis not present

## 2017-03-03 DIAGNOSIS — I5022 Chronic systolic (congestive) heart failure: Secondary | ICD-10-CM | POA: Diagnosis not present

## 2017-03-03 DIAGNOSIS — N189 Chronic kidney disease, unspecified: Secondary | ICD-10-CM | POA: Diagnosis not present

## 2017-03-03 DIAGNOSIS — R1013 Epigastric pain: Secondary | ICD-10-CM | POA: Diagnosis present

## 2017-03-03 DIAGNOSIS — R109 Unspecified abdominal pain: Secondary | ICD-10-CM

## 2017-03-03 DIAGNOSIS — Z91040 Latex allergy status: Secondary | ICD-10-CM | POA: Insufficient documentation

## 2017-03-03 LAB — PROTIME-INR
INR: 1.82
PROTHROMBIN TIME: 20.9 s — AB (ref 11.4–15.2)

## 2017-03-03 LAB — HEPATIC FUNCTION PANEL
ALBUMIN: 3.9 g/dL (ref 3.5–5.0)
ALK PHOS: 163 U/L — AB (ref 38–126)
ALT: 29 U/L (ref 14–54)
AST: 28 U/L (ref 15–41)
Bilirubin, Direct: 0.4 mg/dL (ref 0.1–0.5)
Indirect Bilirubin: 0.6 mg/dL (ref 0.3–0.9)
TOTAL PROTEIN: 7.9 g/dL (ref 6.5–8.1)
Total Bilirubin: 1 mg/dL (ref 0.3–1.2)

## 2017-03-03 LAB — URINALYSIS, ROUTINE W REFLEX MICROSCOPIC
BILIRUBIN URINE: NEGATIVE
Glucose, UA: NEGATIVE mg/dL
HGB URINE DIPSTICK: NEGATIVE
KETONES UR: NEGATIVE mg/dL
NITRITE: NEGATIVE
Protein, ur: 30 mg/dL — AB
SPECIFIC GRAVITY, URINE: 1.009 (ref 1.005–1.030)
pH: 7 (ref 5.0–8.0)

## 2017-03-03 LAB — BASIC METABOLIC PANEL
ANION GAP: 12 (ref 5–15)
BUN: 63 mg/dL — ABNORMAL HIGH (ref 6–20)
CHLORIDE: 103 mmol/L (ref 101–111)
CO2: 22 mmol/L (ref 22–32)
Calcium: 9.4 mg/dL (ref 8.9–10.3)
Creatinine, Ser: 2.6 mg/dL — ABNORMAL HIGH (ref 0.44–1.00)
GFR calc Af Amer: 22 mL/min — ABNORMAL LOW (ref >60)
GFR calc non Af Amer: 19 mL/min — ABNORMAL LOW (ref >60)
GLUCOSE: 124 mg/dL — AB (ref 65–99)
POTASSIUM: 4.8 mmol/L (ref 3.5–5.1)
Sodium: 137 mmol/L (ref 135–145)

## 2017-03-03 LAB — CBG MONITORING, ED
Glucose-Capillary: 120 mg/dL — ABNORMAL HIGH (ref 65–99)
Glucose-Capillary: 101 mg/dL — ABNORMAL HIGH (ref 65–99)

## 2017-03-03 LAB — CBC
HEMATOCRIT: 38.2 % (ref 36.0–46.0)
HEMOGLOBIN: 11.7 g/dL — AB (ref 12.0–15.0)
MCH: 26.4 pg (ref 26.0–34.0)
MCHC: 30.6 g/dL (ref 30.0–36.0)
MCV: 86 fL (ref 78.0–100.0)
Platelets: 136 10*3/uL — ABNORMAL LOW (ref 150–400)
RBC: 4.44 MIL/uL (ref 3.87–5.11)
RDW: 16.5 % — AB (ref 11.5–15.5)
WBC: 6.4 10*3/uL (ref 4.0–10.5)

## 2017-03-03 LAB — LIPASE, BLOOD: LIPASE: 105 U/L — AB (ref 11–51)

## 2017-03-03 LAB — BRAIN NATRIURETIC PEPTIDE: B Natriuretic Peptide: 1095 pg/mL — ABNORMAL HIGH (ref 0.0–100.0)

## 2017-03-03 LAB — TROPONIN I
TROPONIN I: 0.03 ng/mL — AB (ref <0.03)
Troponin I: 0.03 ng/mL (ref <0.03)

## 2017-03-03 MED ORDER — FUROSEMIDE 10 MG/ML IJ SOLN
40.0000 mg | Freq: Once | INTRAMUSCULAR | Status: AC
  Administered 2017-03-03: 40 mg via INTRAVENOUS
  Filled 2017-03-03: qty 4

## 2017-03-03 NOTE — Discharge Instructions (Signed)
Please take an extra furosemide pill for the next 3 days and follow up with your cardiologist to assure your sob is improving.   There are multiple ways to improve your constipation, some options are listed below and all involve over the counter medications:   * You can try the magnesium citrate 1 bottle per day until you get results. * You can try an enema once daily as needed.  * Generally, you need to increase your fluid intake and take stool softeners twice a day until you follow-up with your primary doctor for further management. * MiraLAX is an osmotic laxative. This means that it will keep electrolytes and water in your stool and allow you to have easier bowel movements.        * One option is to take 8 capfuls of MiraLAX and put in a 32 ounce Gatorade and shake it up.  Drink 4-6 ounces per hour until you have results.  Another way is per the directions below.       * You  can take this medication up to 3 times daily as needed produce bowel movements. However while you're taking this much MiraLAX you need to make sure you are replenishing her electrolytes and water loss. He should also not take it more than 2 days in a row at this level. Generally I recommend people workup to this. For example take 1 capful of MiraLAX once a day for 2-3 days and if you do not get improvement in your bowel habits then take 1 capful twice a day for 2-3 days and if they don't get relief from this and you can take 1 capful 3 times a day for 2 days. If you start having diarrhea, decrease the amount of MiraLAX you are using until you have soft formed stools once to twice a day. Remember during this process that you need to increase your electrolytes and water intake, so oftentimes sports drinks are good for this. Some people end up taking half or one whole capful daily for the rest of her lives to have regular bowel movements you can do this as you feel is proper.
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
## 2017-03-03 NOTE — Telephone Encounter (Signed)
I called pt. She said she has been having some pain between her breasts and sometimes it radiates to her back.   She is having some shortness of breath at times, it is relieved by lying down.   She has not had a Bm for 2-3 days and last BM was hard and she had to strain some, but no blood in stool.   Said she has not had much appetite recently, but yesterday she ate a little mac and cheese, green beans and a little chicken. She has not had burping, no indigestion or gas and no vomiting.   I told her with the shortness of breath, she should go to the ED and be evaluated.  She said her husband was going to take her to [facility]. She has a pacemaker.   Forwarding FYI To Dr. Oneida Alar.

## 2017-03-03 NOTE — Telephone Encounter (Signed)
Pt called this morning wanting to be seen today. I told her there were no openings for today, but she could see SF at 2pm on Wednesday this week. Patient has multiple questions about her pain under her breasts and unable to breathe. She thinks something is going on with her stomach. She is aware of OV with SF but still would like to speak with the nurse. Please call her at [phone]

## 2017-03-03 NOTE — Telephone Encounter (Signed)
REVIEWED. AGREE. NO ADDITIONAL RECOMMENDATIONS. 

## 2017-03-03 NOTE — ED Notes (Signed)
Patient transported to CT 

## 2017-03-03 NOTE — ED Notes (Signed)
Date and time results received: 03/03/17 11:29 AM  (use smartphrase ".now" to insert current time)  Test: Troponin Critical Value: 0.03  Name of Provider Notified: Jacobowitz  Orders Received? Or Actions Taken?: Orders Received - See Orders for details

## 2017-03-04 ENCOUNTER — Telehealth: Payer: Self-pay | Admitting: Endocrinology

## 2017-03-04 NOTE — Telephone Encounter (Signed)
Patient is calling on status of  s PA on medication warfarin (COUMADIN) 5 MG tablet [[id number]] And she also need a refill of benazepril (LOTENSIN) 20 MG tablet [[id number]]   Send to  [place], [place] (Phone) [phone] (Fax)

## 2017-03-05 ENCOUNTER — Ambulatory Visit: Payer: Medicaid Other | Admitting: Endocrinology

## 2017-03-05 ENCOUNTER — Telehealth: Payer: Self-pay | Admitting: Cardiovascular Disease

## 2017-03-05 ENCOUNTER — Ambulatory Visit: Payer: Medicaid Other | Admitting: Gastroenterology

## 2017-03-05 ENCOUNTER — Encounter: Payer: Self-pay | Admitting: Gastroenterology

## 2017-03-05 DIAGNOSIS — R071 Chest pain on breathing: Secondary | ICD-10-CM | POA: Diagnosis not present

## 2017-03-05 DIAGNOSIS — R079 Chest pain, unspecified: Secondary | ICD-10-CM | POA: Insufficient documentation

## 2017-03-05 MED ORDER — BENAZEPRIL HCL 20 MG PO TABS: 10.0000 mg | ORAL_TABLET | Freq: Every morning | ORAL | 3 refills | Status: DC

## 2017-03-05 MED ORDER — WARFARIN SODIUM 5 MG PO TABS: 2.5000 mg | ORAL_TABLET | ORAL | 5 refills | Status: DC
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
## 2017-03-05 NOTE — Patient Instructions (Signed)
CONTINUE TO MONITOR SYMPTOMS.  WARM COMPRESSES OR HEATING PAD TO CHEST WALL THREE TIMES A DAY FOR 10 DAYS.  USE TYLENOL IF NEEDED FOR PAIN.  PLEASE CALL WITH QUESTIONS OR CONCERNS.  FOLLOW UP IN 6 MOS.

## 2017-03-05 NOTE — Telephone Encounter (Signed)
Pt called stating she cannot take the generic of warfarin (COUMADIN) 5 MG tablet [[id number]]  That she needs the name brand, also needing a refill on benazepril (LOTENSIN) 20 MG tablet [[id number]]  Sent to [place] [facility]

## 2017-03-05 NOTE — Telephone Encounter (Signed)
Benazepril refill sent to [facility]

## 2017-03-05 NOTE — Telephone Encounter (Signed)
Called to notify pt that Rx's will be ready for pick up today.

## 2017-03-06 ENCOUNTER — Other Ambulatory Visit: Payer: Self-pay

## 2017-03-06 ENCOUNTER — Ambulatory Visit: Payer: Medicaid Other | Admitting: Cardiovascular Disease

## 2017-03-06 ENCOUNTER — Encounter: Payer: Self-pay | Admitting: Cardiovascular Disease

## 2017-03-06 NOTE — Telephone Encounter (Signed)
These prescriptions are not ordered by you ok to refill please advise

## 2017-03-06 NOTE — Progress Notes (Signed)
CC'D TO PCP °

## 2017-03-06 NOTE — Progress Notes (Signed)
ON RECALL  °

## 2017-03-06 NOTE — Telephone Encounter (Signed)
Not my prescriptions

## 2017-03-07 MED ORDER — LIRAGLUTIDE 18 MG/3ML ~~LOC~~ SOPN: PEN_INJECTOR | SUBCUTANEOUS | 0 refills | Status: DC

## 2017-03-07 NOTE — Telephone Encounter (Signed)
Refill submitted. 

## 2017-03-07 NOTE — Telephone Encounter (Signed)
Patient need a refill of  :  liraglutide ([place]) 18 MG/3ML SOPN [[id number]]    Pharmacy:  [place], [place]

## 2017-03-12 ENCOUNTER — Telehealth: Payer: Self-pay | Admitting: Endocrinology

## 2017-03-12 NOTE — Telephone Encounter (Addendum)
Patient called back today about her medication, to see if PA for Victoza, please advise [phone] sharra

## 2017-03-12 NOTE — Telephone Encounter (Signed)
liraglutide (VICTOZA) 18 MG/3ML [place], [place] is needing prior authorization for script.

## 2017-03-13 NOTE — Telephone Encounter (Signed)
LM for pt to return my call. I called her upon her request

## 2017-03-14 NOTE — Telephone Encounter (Signed)
victoza was ordered on 1/11

## 2017-03-17 ENCOUNTER — Telehealth: Payer: Self-pay | Admitting: Endocrinology

## 2017-03-17 NOTE — Telephone Encounter (Signed)
Patient is calling on the status of her PA for her medication victoza, she stated that it has been 2 months and, and she will be out tomorrow,she want to know why is this such a problem. Please advise

## 2017-03-19 ENCOUNTER — Telehealth: Payer: Self-pay | Admitting: Gastroenterology

## 2017-03-19 NOTE — Telephone Encounter (Signed)
Pt said she is having problems getting Victoza and Coumadin and Dr. Oneida Alar told her at last OV to call if she has problems.

## 2017-03-19 NOTE — Telephone Encounter (Signed)
Called patient.SHE NEEDS VICTOZA. WILL FILL OUT PA JAN 24.

## 2017-03-19 NOTE — Telephone Encounter (Signed)
PATIENT HAS BEEN HAVING TROUBLE GETTING HER MEDICATION  PLEASE CALL HER [phone] SLF TOLD HER IF SHE IS HAVING TROUBLE GETTING MEDS TO CALL

## 2017-03-19 NOTE — Telephone Encounter (Signed)
Routing to Dr. Fields.  

## 2017-03-20 NOTE — Telephone Encounter (Signed)
PA for Victoza for one year. PA #[id number]. Ref # [id number].  LMOM for [employer] at [employer].

## 2017-03-24 ENCOUNTER — Ambulatory Visit (INDEPENDENT_AMBULATORY_CARE_PROVIDER_SITE_OTHER): Payer: Medicaid Other | Admitting: *Deleted

## 2017-03-24 ENCOUNTER — Telehealth: Payer: Self-pay | Admitting: Cardiovascular Disease

## 2017-03-24 DIAGNOSIS — I4891 Unspecified atrial fibrillation: Secondary | ICD-10-CM | POA: Diagnosis not present

## 2017-03-24 DIAGNOSIS — Z5181 Encounter for therapeutic drug level monitoring: Secondary | ICD-10-CM

## 2017-03-24 DIAGNOSIS — I5022 Chronic systolic (congestive) heart failure: Secondary | ICD-10-CM

## 2017-03-24 LAB — POCT INR: INR: 2.3

## 2017-03-24 NOTE — Telephone Encounter (Signed)
Patient would like order placed for an echo. / tg

## 2017-03-24 NOTE — Telephone Encounter (Signed)
Echo order placed, will call pt with apt

## 2017-03-24 NOTE — Telephone Encounter (Signed)
That would be fine 

## 2017-03-24 NOTE — Telephone Encounter (Signed)
REVIEWED-NO ADDITIONAL RECOMMENDATIONS. 
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
## 2017-03-24 NOTE — Patient Instructions (Signed)
Continue coumadin 1/2 tablet daily except 1 tablet on Mondays   Continue 2 servings each week of leafy green vegetables.  Recheck in 4 weeks

## 2017-03-28 NOTE — Telephone Encounter (Signed)
Placed form on dr. Jodelle Green desk

## 2017-04-03 ENCOUNTER — Telehealth: Payer: Self-pay

## 2017-04-03 NOTE — Telephone Encounter (Signed)
Called pt to ask her to contact her insurance to find out what medication is preferred since Victoza is not.

## 2017-04-03 NOTE — Telephone Encounter (Signed)
Not sure if patient still needs this I have not started

## 2017-04-04 ENCOUNTER — Ambulatory Visit (INDEPENDENT_AMBULATORY_CARE_PROVIDER_SITE_OTHER): Payer: Medicaid Other | Admitting: Endocrinology

## 2017-04-04 ENCOUNTER — Encounter: Payer: Self-pay | Admitting: Endocrinology

## 2017-04-04 VITALS — BP 118/82 | HR 62 | Ht 66.0 in | Wt 190.2 lb

## 2017-04-04 DIAGNOSIS — Z794 Long term (current) use of insulin: Secondary | ICD-10-CM | POA: Diagnosis not present

## 2017-04-04 DIAGNOSIS — E1165 Type 2 diabetes mellitus with hyperglycemia: Secondary | ICD-10-CM | POA: Diagnosis not present

## 2017-04-04 DIAGNOSIS — N184 Chronic kidney disease, stage 4 (severe): Secondary | ICD-10-CM | POA: Diagnosis not present

## 2017-04-04 LAB — POCT GLYCOSYLATED HEMOGLOBIN (HGB A1C): Hemoglobin A1C: 7.8

## 2017-04-04 NOTE — Telephone Encounter (Signed)
See recent TC

## 2017-04-04 NOTE — Patient Instructions (Signed)
Humulin N 26 at nits  Novolog BEFORE MEAL ALWAYS and take 14-18 depending on what u eat  More sugars after bfst and lunch

## 2017-04-04 NOTE — Progress Notes (Signed)
Patient ID: [name], female   DOB: [birth year], 57 y.o.   MRN: [id number]   Reason for Appointment : Follow up for Type 1 Diabetes  History of Present Illness           Diagnosis: Type 2 diabetes mellitus, date of diagnosis: 1990        Past history: She has been on insulin since 2007. Generally has had poor control in requiring large doses of insulin. Blood sugars also have been previously variable making her control difficult. She has had difficulty following instructions for mealtime insulin consistently in the past  Has been on a multiple injection regimen of Lantus twice a day, NovoLog a.c. and NPH at bedtime A1c has been as high as 11.3 previously   Recent history: INSULIN regimen is described as: [place] 48,  Humulin N 30-34 hs, NovoLog a.c.12- 14-16 before meals  He has not been seen in follow-up since 11/18  Although her A1c is about the same as before it is still not at her target of 7%, today it is 7.8    Glucose patterns , current management and problems identified:  She has not been able to get her Victoza because of insurance difficulties for at least a couple of months and only started taking this about a week ago  She thinks her blood sugars are higher because of this  She is checking her blood sugars mostly in the mornings and between about 5-10 PM and not sure which readings are postprandial or before eating  Blood sugars at those times are quite variable with standard deviation overall 83  RECENT fasting readings are mostly fairly good with lowest reading 79  She now states that she may adjust her NPH at bedtime based on her blood sugar at the time of insulin injection  Readings later in the evening are more consistently high and likely not taking enough insulin to cover her meals  She may not always take her NOVOLOG before eating and sometimes up to 30 minutes later, this is despite discussion on previous visits to make sure she takes it  before eating and also adjusted based on what she is planning to eat; she may well be taking the NovoLog based on postprandial blood sugar  The  weight is about the same  No hypoglycemia except once recently late afternoon from likely over estimating what she was eating her lunch  However last month was having periodic low blood sugars in the 60s before breakfast and once overnight  She has not done much activity  Also she was explained the use of the Omnipod insulin pump but she has not looked into this at all and still does not understand how it works  Glucose monitoring:  done  1-2 times a day         Glucometer:  Accu-Chek [employer]iva        Blood Glucose readings from meter download:   Mean values apply above for all meters except median for One Touch  PRE-MEAL Fasting Lunch Dinner Bedtime Overall  Glucose range:  54-248    62-256   Mean/median:  124   234  174 159 +/-83   POST-MEAL PC Breakfast PC Lunch PC Dinner  Glucose range:   Highest 298   Mean/median:       Mean values apply above for all meters except median for One Touch  PRE-MEAL Fasting Lunch Dinner Bedtime Overall  Glucose range: 73-254 95  211, 243  1  35-1 99    Mean/median: 152    147   Physical activity: exercise: Walking some   Self-care: The diet that the patient has been following is low fat,  Lunch : Sometimes salad otherwise sandwiches  Meals: 3 meals per day.  breakfast is 9 am at lunch 12 pm Supper at about 6-7 PM, mealtimes are variable           Dietician visit: Most recent:.Several years ago  Last CD consult in 10/2013            Wt Readings from Last 3 Encounters:  04/04/17 190 lb 3.2 oz (86.3 kg)  03/05/17 190 lb 6.4 oz (86.4 kg)  03/03/17 194 lb (88 kg)   A1c results  Lab Results  Component Value Date   HGBA1C 7.8 04/04/2017   HGBA1C 7.9 01/01/2017   HGBA1C 7.7 05/20/2016   Lab Results  Component Value Date   MICROALBUR 1.9 10/14/2014   LDLCALC 93 01/23/2015   CREATININE 2.60 (H)  03/03/2017     Lab Results  Component Value Date   MICROALBUR 1.9 10/14/2014     Allergies as of 04/04/2017      Reactions   Wheat Swelling   Latex Rash   Penicillins Rash   Sulfa Antibiotics Rash      Medication List        Accurate as of 04/04/17 11:59 PM. Always use your most recent med list.          acetaminophen 500 MG tablet Commonly known as:  TYLENOL Take 500 mg by mouth every 6 (six) hours as needed for moderate pain.   allopurinol 100 MG tablet Commonly known as:  ZYLOPRIM Take 150 mg by mouth every morning. Reported on 06/15/2015   benazepril 20 MG tablet Commonly known as:  LOTENSIN Take 0.5 tablets (10 mg total) by mouth every morning. Takes 1/2 tablet   COLCRYS 0.6 MG tablet Generic drug:  colchicine Take 0.6 mg by mouth daily as needed (gout flare). Reported on 06/15/2015   COREG CR 40 MG 24 hr capsule Generic drug:  carvedilol TAKE ONE CAPSULE BY MOUTH DAILY.   furosemide 80 MG tablet Commonly known as:  LASIX TAKE (1) TABLET BY MOUTH TWICE DAILY.   Insulin NPH (Human) (Isophane) 100 UNIT/ML Kiwkpen Commonly known as:  HUMULIN N KWIKPEN INJECT 34 UNITS SUBCUTANEOUSLY AT BEDTIME.   LANOXIN 0.125 MG tablet Generic drug:  digoxin TAKE 1 TABLET BY MOUTH DAILY EXCEPT ON SATURDAY AND SUNDAY.   liraglutide 18 MG/3ML Sopn Commonly known as:  VICTOZA INJECT 1.2MG  SUBCUTANEOUSLY DAILY AS DIRECTED.   NOVOLOG FLEXPEN 100 UNIT/ML FlexPen Generic drug:  insulin aspart INJECT SUBCUTANEOUSLY 12 UNITS BEFORE BREAKFAST; 14 UNITS AT LUNCH; AND 16 UNITS AT DINNER.   spironolactone 25 MG tablet Commonly known as:  ALDACTONE Take 25 mg by mouth every morning.   TRESIBA FLEXTOUCH 200 UNIT/ML Sopn Generic drug:  Insulin Degludec INJECT 56 UNITS INTO THE SKIN DAILY BEFORE BREAKFAST.   warfarin 5 MG tablet Commonly known as:  COUMADIN Take as directed by the anticoagulation clinic. If you are unsure how to take this medication, talk to your nurse or  doctor. Original instructions:  Take 0.5-1 tablets (2.5-5 mg total) by mouth See admin instructions. Take 1/2 tablet daily except 1 tablet on Mondays and Thursdays       Allergies:  Allergies  Allergen Reactions  . Wheat Swelling  . Latex Rash  . Penicillins Rash  . Sulfa Antibiotics Rash  Past Medical History:  Diagnosis Date  . Automatic implantable cardioverter-defibrillator in situ    a. 03/2013: BSX Energen CRTD BiV ICC, ser# [id number]  . Chronic anticoagulation    a. coumadin.  . Chronic atrial fibrillation (HCC)    embolic rind  . Chronic kidney disease    creatinine- 1.8 in 09/2006 and 2.0 in 05/2007; 2.05 in 2011, 2.29 in 2012  . Chronic systolic CHF (congestive heart failure) ([place])    a. 03/2013 Echo: Sev LV dysfxn with sev diff HK, restrictive phys, diast dysfxn, mild MR, sev dil LA/RA, mod PR, PASP 57mmHg.  . Congenital third degree heart block    a. Guidant VVI pacemaker implanted in 09/1997; b. gen change in 01/2004;  c. 03/2013 upgrade to [place], ser# [id number].  . Dizziness and giddiness 05/19/2012  . Dysphagia 08/14/2011   FEB 2013 EGD/DIL 16 MM; GERD; h/o gastroesophageal reflux disease; + H. Pylori gastritis   . GERD (gastroesophageal reflux disease)   . Helicobacter pylori gastritis 08/14/2011  . IDDM (insulin dependent diabetes mellitus) ([place])   . Kidney stones 1990's  . Nonischemic cardiomyopathy ([place])    a. 03/2013 Echo: Sev LV dysfxn with sev diff HK, restrictive phys, diast dysfxn, mild MR, sev dil LA/RA, mod PR, PASP 43mmHg;  b. 03/2013: BSX Energen CRTD BiV ICC, ser# [id number].  Marland Kitchen Stroke [facility]) 1999   denies residual on 04/21/2013    Past Surgical History:  Procedure Laterality Date  . BI-VENTRICULAR IMPLANTABLE CARDIOVERTER DEFIBRILLATOR  (CRT-D)  04/21/2013  . BI-VENTRICULAR IMPLANTABLE CARDIOVERTER DEFIBRILLATOR UPGRADE N/A 04/21/2013   Procedure: BI-VENTRICULAR IMPLANTABLE CARDIOVERTER DEFIBRILLATOR UPGRADE;  Surgeon: Evans Lance, MD;   Location: [facility] CATH LAB;  Service: Cardiovascular;  Laterality: N/A;  . COLONOSCOPY  04/23/2011   DVV:[id number] COLON POLYP/ INTERNAL HEMORRHOIDS/ TORTUOUS COLON  . INSERT / REPLACE / REMOVE PACEMAKER  09/1997   Guidant VVI [employer]  . PACEMAKER GENERATOR CHANGE  01/2004  . UPPER GASTROINTESTINAL ENDOSCOPY  FEB 2013   RING/DIL TO 16 MM, H PYLORI GASTRITIS    Family History  Adopted: Yes  Problem Relation Age of Onset  . Colon cancer Neg Hx   . Colon polyps Neg Hx     Social History:  reports that  has never smoked. she has never used smokeless tobacco. She reports that she does not drink alcohol or use drugs.    Review of Systems:   She has had stable chronic renal insufficiency, followed by nephrologist .  No history of microalbuminuria   Lab Results  Component Value Date   CREATININE 2.60 (H) 03/03/2017   BUN 63 (H) 03/03/2017   NA 137 03/03/2017   K 4.8 03/03/2017   CL 103 03/03/2017   CO2 22 03/03/2017    She has had cardiomyopathy and History of atrial fibrillation followed by cardiologist  Lipids: Has dyslipidemia with low HDL, high triglycerides, not on any statin  Lab Results  Component Value Date   CHOL 131 09/22/2015   HDL 23.30 (L) 09/22/2015   LDLCALC 93 01/23/2015   LDLDIRECT 65.0 09/22/2015   TRIG 247.0 (H) 09/22/2015   CHOLHDL 6 09/22/2015    Foot exam in 1/18 normal except absent pedal pulses on the right     Physical Examination:  BP 118/82 (BP Location: Left Arm, Patient Position: Sitting, Cuff Size: Normal)   Pulse 62   Ht 5\' 6"  (1.676 m)   Wt 190 lb 3.2 oz (86.3 kg)   SpO2 98%   BMI  30.70 kg/m   ASSESSMENT:  Diabetes type 1:  See history of present illness for detailed discussion of her current blood sugar patterns, treatment regimen, meal planning and problems identified   A1c is again high at 7.8 and not improving  Currently on basal bolus insulin and although she was probably doing better with postprandial  hyperglycemia oozing because she has not had this because of insurance difficulties  She has marked variability in her blood sugars after meals especially in the evening However difficult to sort out her readings in the evenings as before or after eating She is most likely taking NovoLog postprandially when the blood sugar goes up and not proactively Also likely not getting enough insulin to cover her evening meals most of the time  Occasionally had low normal sugars in the mornings from taking extra NPH when sugars are high at bedtime  She does need to monitor more after breakfast and lunch also   CKD: Followed by nephrologist  PLAN:    Showed her the use of the Omnipod insulin pump and how it would work as well as simplifying her injection regimen  She also was advised not to adjust her bedtime dose of NPH based on the time reading  Since she is having low normal readings and occasional hypoglycemia in the morning she will take a consistent amount of 26 units at night, this will also reduce tendency to as low sugar that 3 AM  She does need to add at least 2 more units at suppertime based on meal size and carbohydrate amounts  She would likely benefit from consultation with a dietitian also  Emphasized that the NovoLog must be taken before eating consistently  More blood sugars after breakfast and lunch  Information on the Omnipod insulin pump given to take and insurance verification form completed  Follow-up in 2 months  Patient Instructions  Humulin N 26 at nits  [place] and take 14-18 depending on what u eat  More sugars after bfst and lunch    Counseling time on subjects discussed in assessment and plan sections is over 50% of today's 25 minute visit     Elayne Snare 04/05/2017, 8:23 PM

## 2017-04-04 NOTE — Telephone Encounter (Signed)
Called pt yesterday in a separate TC to check with her insurance and call our office back

## 2017-04-09 ENCOUNTER — Other Ambulatory Visit: Payer: Self-pay | Admitting: Endocrinology

## 2017-04-10 ENCOUNTER — Ambulatory Visit (HOSPITAL_COMMUNITY): Payer: Medicaid Other

## 2017-04-10 ENCOUNTER — Ambulatory Visit (HOSPITAL_COMMUNITY)
Admission: RE | Admit: 2017-04-10 | Discharge: 2017-04-10 | Disposition: A | Discharge status: Home / Self Care | Payer: Medicaid Other | Source: Ambulatory Visit | Attending: Cardiovascular Disease | Admitting: Cardiovascular Disease

## 2017-04-10 DIAGNOSIS — I5022 Chronic systolic (congestive) heart failure: Secondary | ICD-10-CM | POA: Diagnosis not present

## 2017-04-10 DIAGNOSIS — I4891 Unspecified atrial fibrillation: Secondary | ICD-10-CM | POA: Diagnosis not present

## 2017-04-10 DIAGNOSIS — Q246 Congenital heart block: Secondary | ICD-10-CM | POA: Insufficient documentation

## 2017-04-10 DIAGNOSIS — E119 Type 2 diabetes mellitus without complications: Secondary | ICD-10-CM | POA: Diagnosis not present

## 2017-04-10 DIAGNOSIS — I429 Cardiomyopathy, unspecified: Secondary | ICD-10-CM | POA: Insufficient documentation

## 2017-04-10 DIAGNOSIS — I082 Rheumatic disorders of both aortic and tricuspid valves: Secondary | ICD-10-CM | POA: Diagnosis not present

## 2017-04-10 DIAGNOSIS — Z95810 Presence of automatic (implantable) cardiac defibrillator: Secondary | ICD-10-CM | POA: Diagnosis not present

## 2017-04-10 NOTE — Progress Notes (Signed)
*  PRELIMINARY RESULTS* Echocardiogram 2D Echocardiogram has been performed.   After completing echo, my patient informed me that her sugar was dropping. She said she didn't feel right. She ate her own peanut butter crackers and I gave her a regular coke. She ate that then her sugar was 101 she checked with her own glucometer. Waited about 5 minutes then patient was laying on bed saying she noticed when her sugar drops she can feel her heart beat in her neck. I informed the patient I was going to call a nurse to check on her. Patient said ok. Patient asked for second coke.   [facility] called. Patient checked her sugar while Lelon Frohlich, RN was present. Blood sugar was 184.   Ann, RN from rapid response said to get her a tray, Duard Brady got the patient a baked chicken tray.  Blood pressure prior to sugar drop 120/75. Blood pressure post food and rapid [facility] 132/77. Blood sugar post food and [facility] 160 on patient's on glucometer.   Patient does not want ER and feels stable enough to drive home. Patient appears stable enough to myself and Diane Coad. Time of incident from 11:20-12:35.  [facility] personal Rosana Hoes Respiratory Tech Tery Sanfilippo Several techs and nurses. Dr.Branch made aware of situation and asked him to read echo while patient still in our care. Leavy Cella 04/10/2017, 12:08 PM

## 2017-04-10 NOTE — Progress Notes (Signed)
*  PRELIMINARY RESULTS* Echocardiogram 2D Echocardiogram has been performed.  Leavy Cella 04/10/2017, 11:34 AM

## 2017-04-11 ENCOUNTER — Telehealth: Payer: Self-pay | Admitting: Cardiovascular Disease

## 2017-04-11 ENCOUNTER — Telehealth: Payer: Self-pay | Admitting: *Deleted

## 2017-04-11 ENCOUNTER — Telehealth: Payer: Self-pay | Admitting: Gastroenterology

## 2017-04-11 MED ORDER — JANTOVEN 5 MG PO TABS: ORAL_TABLET | ORAL | 2 refills | Status: DC

## 2017-04-11 NOTE — Telephone Encounter (Signed)
Per voicemail from [place] w/ [place]-- please cancel prior auth request. They did get it to go through on Medicaid w/ a $3 copay and will stick w/ JANTOVEN 5 MG tablet [[id number]] . They are out at this time, but will have some in on Monday.

## 2017-04-11 NOTE — Telephone Encounter (Signed)
Called [facility] TO INFORM ABOUT PT BEING UNABLE TO GET HER COUMADIN WITHOUT A PRIOR AUTHORIZATION. THEY ASSURED ME THEY WILL GET IT TAKEN CARE OF TODAY.

## 2017-04-11 NOTE — Telephone Encounter (Signed)
[name] is preferred on [place] Medicaid, Coumadin is not. Will send in rx for brand Jantoven to see if pt can have this filled today without a prior authorization. Do not anticipate quick approval for Coumadin prior auth since patient has not had any issues with generic warfarin, she is just wanting brand name.

## 2017-04-11 NOTE — Telephone Encounter (Signed)
Ervin Knack still requires a prior authorization. Will forward to our PA department for assistance with Coumadin 5mg  prior authorization with Medicaid.

## 2017-04-11 NOTE — Telephone Encounter (Signed)
I spoke to Dr. Oneida Alar and she said to have [place] fax the request to Dr. Jacinta Shoe.

## 2017-04-11 NOTE — Telephone Encounter (Signed)
I have called Vaughan Basta at [facility] and they are forwarding the request to Dr. Jacinta Shoe for the PA on coumadin.

## 2017-04-11 NOTE — Telephone Encounter (Signed)
Noted  

## 2017-04-11 NOTE — Telephone Encounter (Signed)
Pt is need a prior auth for her coumadin sent to [place] --received a phone call from Dr. Oneida Alar stating this pt is needing this to be done today, STAT. Dr. Oneida Alar completed it back in December because the pt stated she couldn't get it completed by our office.

## 2017-04-11 NOTE — Telephone Encounter (Signed)
[phone] PATIENT CALLED AND STATED SHE WAS ALMOST OUT OF COUMADIN AND IT NEEDED TO BE SENT TO THE PHARMACY, STATED [facility] DID THAT FOR HER

## 2017-04-11 NOTE — Telephone Encounter (Signed)
Sent prior-auth to coumadin clinic

## 2017-04-11 NOTE — Telephone Encounter (Signed)
Pt is aware of the plan

## 2017-04-11 NOTE — Telephone Encounter (Signed)
Our PA staff is not here today and we do not have a PA request to complete. It looks as though the prior authorization form was being faxed to Dr Court Joy office. This will need to be completed for the Coumadin PA.

## 2017-04-14 ENCOUNTER — Telehealth: Payer: Self-pay | Admitting: Internal Medicine

## 2017-04-14 NOTE — Telephone Encounter (Signed)
Pt notified that Ervin Knack was ordered and will be ready for her to pick up today (04/14/17)

## 2017-04-14 NOTE — Telephone Encounter (Signed)
Please address medication Ervin Knack) with patient..  She has some concerns about its' usage.Marland Kitchen  Marland KitchenThank you

## 2017-04-14 NOTE — Telephone Encounter (Signed)
This pt is followed in our [place] office as the pt sees Dr Bronson Ing there. Will forward message to that office.

## 2017-04-14 NOTE — Telephone Encounter (Signed)
This is a [facility] patient - will forward to [facility]. Pt is not on a new blood thinner, her brand warfarin was changed from Coumadin to Jantoven due to Medicaid coverage.

## 2017-04-14 NOTE — Telephone Encounter (Signed)
New message  Pt c/o medication issue:  1. Name of Medication: JANTOVEN 5 MG tablet  2. How are you currently taking this medication (dosage and times per day)? Take 1/2 to 1 tablet daily as directed by Coumadin clinic  3. Are you having a reaction (difficulty breathing--STAT)? Just wants more info about meds  4. What is your medication issue? Pt says that he wants to know what Lovena Le thinks about the new medication she is on

## 2017-04-14 NOTE — Telephone Encounter (Signed)
Called and spoke to pt for the 3rd time today about warfarin being changed to [place] per Medicaid coverage.  Tried to explain to pt again the meds are the same anticoagulant just a different name. Will forward to Dr Lovena Le to make him aware per patient request.

## 2017-04-16 NOTE — Telephone Encounter (Signed)
Pt given instruction.  No further action.

## 2017-04-21 ENCOUNTER — Ambulatory Visit (INDEPENDENT_AMBULATORY_CARE_PROVIDER_SITE_OTHER): Payer: Medicaid Other | Admitting: *Deleted

## 2017-04-21 DIAGNOSIS — Z5181 Encounter for therapeutic drug level monitoring: Secondary | ICD-10-CM

## 2017-04-21 DIAGNOSIS — I4891 Unspecified atrial fibrillation: Secondary | ICD-10-CM | POA: Diagnosis not present

## 2017-04-21 LAB — POCT INR: INR: 2.8

## 2017-04-21 NOTE — Patient Instructions (Signed)
Continue coumadin 1/2 tablet daily except 1 tablet on Mondays   Continue 2 servings each week of leafy green vegetables.  Recheck in 4 weeks

## 2017-04-24 ENCOUNTER — Telehealth: Payer: Self-pay | Admitting: *Deleted

## 2017-04-24 NOTE — Telephone Encounter (Signed)
PA for Coreg CR 40 mg called to [facility]. Spoke with Abigail Butts. comf # [id number]

## 2017-05-01 ENCOUNTER — Ambulatory Visit (INDEPENDENT_AMBULATORY_CARE_PROVIDER_SITE_OTHER): Payer: Medicaid Other | Admitting: *Deleted

## 2017-05-01 DIAGNOSIS — I428 Other cardiomyopathies: Secondary | ICD-10-CM | POA: Diagnosis not present

## 2017-05-01 NOTE — Progress Notes (Signed)
Remote ICD transmission.   

## 2017-05-02 ENCOUNTER — Encounter: Payer: Self-pay | Admitting: Cardiology

## 2017-05-07 ENCOUNTER — Encounter: Payer: Self-pay | Admitting: Cardiovascular Disease

## 2017-05-07 ENCOUNTER — Ambulatory Visit (INDEPENDENT_AMBULATORY_CARE_PROVIDER_SITE_OTHER): Payer: Medicaid Other | Admitting: Cardiovascular Disease

## 2017-05-07 VITALS — BP 116/73 | HR 60 | Ht 66.0 in | Wt 187.4 lb

## 2017-05-07 DIAGNOSIS — Z95810 Presence of automatic (implantable) cardiac defibrillator: Secondary | ICD-10-CM | POA: Diagnosis not present

## 2017-05-07 DIAGNOSIS — I428 Other cardiomyopathies: Secondary | ICD-10-CM | POA: Diagnosis not present

## 2017-05-07 DIAGNOSIS — I4891 Unspecified atrial fibrillation: Secondary | ICD-10-CM

## 2017-05-07 DIAGNOSIS — I482 Chronic atrial fibrillation, unspecified: Secondary | ICD-10-CM

## 2017-05-07 DIAGNOSIS — I5022 Chronic systolic (congestive) heart failure: Secondary | ICD-10-CM

## 2017-05-07 DIAGNOSIS — I10 Essential (primary) hypertension: Secondary | ICD-10-CM | POA: Diagnosis not present

## 2017-05-07 NOTE — Patient Instructions (Signed)
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
## 2017-05-14 LAB — CUP PACEART REMOTE DEVICE CHECK
Battery Remaining Percentage: 100 %
Brady Statistic RA Percent Paced: 0 %
Brady Statistic RV Percent Paced: 99 %
HighPow Impedance: 74 Ohm
Implantable Lead Implant Date: 20150225
Implantable Lead Implant Date: 20150225
Implantable Lead Location: 753858
Implantable Lead Model: 180
Implantable Lead Serial Number: 311545
Implantable Pulse Generator Implant Date: 20150225
Lead Channel Impedance Value: 434 Ohm
Lead Channel Pacing Threshold Amplitude: 0.4 V
Lead Channel Pacing Threshold Pulse Width: 0.5 ms
Lead Channel Setting Pacing Amplitude: 2.4 V
Lead Channel Setting Pacing Amplitude: 2.4 V
Lead Channel Setting Pacing Pulse Width: 0.5 ms
Lead Channel Setting Sensing Sensitivity: 0.6 mV
MDC IDC LEAD LOCATION: 753860
MDC IDC MSMT BATTERY REMAINING LONGEVITY: 72 mo
MDC IDC MSMT LEADCHNL RV IMPEDANCE VALUE: 479 Ohm
MDC IDC MSMT LEADCHNL RV PACING THRESHOLD AMPLITUDE: 0.6 V
MDC IDC MSMT LEADCHNL RV PACING THRESHOLD PULSEWIDTH: 0.5 ms
MDC IDC PG SERIAL: 114945
MDC IDC SESS DTM: 20190307070400
MDC IDC SET LEADCHNL LV PACING PULSEWIDTH: 0.5 ms
MDC IDC SET LEADCHNL LV SENSING SENSITIVITY: 1 mV

## 2017-05-19 ENCOUNTER — Other Ambulatory Visit: Payer: Self-pay | Admitting: Endocrinology

## 2017-05-20 ENCOUNTER — Ambulatory Visit (INDEPENDENT_AMBULATORY_CARE_PROVIDER_SITE_OTHER): Payer: Medicaid Other | Admitting: *Deleted

## 2017-05-20 DIAGNOSIS — I4891 Unspecified atrial fibrillation: Secondary | ICD-10-CM

## 2017-05-20 DIAGNOSIS — Z5181 Encounter for therapeutic drug level monitoring: Secondary | ICD-10-CM | POA: Diagnosis not present

## 2017-05-20 LAB — POCT INR: INR: 2.9

## 2017-05-20 NOTE — Patient Instructions (Signed)
Continue coumadin 1/2 tablet daily except 1 tablet on Mondays   Continue 2 servings each week of leafy green vegetables.  Recheck in 6 weeks

## 2017-06-02 ENCOUNTER — Other Ambulatory Visit: Payer: Self-pay | Admitting: Internal Medicine

## 2017-06-12 ENCOUNTER — Other Ambulatory Visit: Payer: Self-pay | Admitting: Obstetrics & Gynecology

## 2017-06-12 DIAGNOSIS — Z1231 Encounter for screening mammogram for malignant neoplasm of breast: Secondary | ICD-10-CM

## 2017-06-13 ENCOUNTER — Other Ambulatory Visit: Payer: Self-pay | Admitting: Endocrinology

## 2017-06-13 ENCOUNTER — Other Ambulatory Visit: Payer: Self-pay | Admitting: Internal Medicine

## 2017-06-13 NOTE — Telephone Encounter (Signed)
This is a [place] pt.  °

## 2017-06-25 ENCOUNTER — Other Ambulatory Visit (HOSPITAL_COMMUNITY): Payer: Self-pay | Admitting: Internal Medicine

## 2017-06-25 ENCOUNTER — Ambulatory Visit (HOSPITAL_COMMUNITY)
Admission: RE | Admit: 2017-06-25 | Discharge: 2017-06-25 | Disposition: A | Discharge status: Home / Self Care | Payer: Medicaid Other | Source: Ambulatory Visit | Attending: Internal Medicine | Admitting: Internal Medicine

## 2017-06-25 DIAGNOSIS — R0989 Other specified symptoms and signs involving the circulatory and respiratory systems: Secondary | ICD-10-CM

## 2017-06-25 DIAGNOSIS — I517 Cardiomegaly: Secondary | ICD-10-CM | POA: Insufficient documentation

## 2017-07-02 ENCOUNTER — Ambulatory Visit (INDEPENDENT_AMBULATORY_CARE_PROVIDER_SITE_OTHER): Payer: Medicaid Other | Admitting: *Deleted

## 2017-07-02 ENCOUNTER — Ambulatory Visit: Payer: Medicaid Other | Admitting: Endocrinology

## 2017-07-02 DIAGNOSIS — Z5181 Encounter for therapeutic drug level monitoring: Secondary | ICD-10-CM

## 2017-07-02 DIAGNOSIS — I4891 Unspecified atrial fibrillation: Secondary | ICD-10-CM

## 2017-07-02 LAB — POCT INR: INR: 4.8

## 2017-07-02 NOTE — Patient Instructions (Signed)
Hold coumadin tonight and tomorrow night then resume 1/2 tablet daily except 1 tablet on Mondays   Continue 2 servings each week of leafy green vegetables.  Recheck in 3 weeks

## 2017-07-08 ENCOUNTER — Ambulatory Visit: Payer: Medicaid Other

## 2017-07-23 ENCOUNTER — Ambulatory Visit (INDEPENDENT_AMBULATORY_CARE_PROVIDER_SITE_OTHER): Payer: Medicaid Other | Admitting: *Deleted

## 2017-07-23 DIAGNOSIS — I4891 Unspecified atrial fibrillation: Secondary | ICD-10-CM | POA: Diagnosis not present

## 2017-07-23 DIAGNOSIS — Z5181 Encounter for therapeutic drug level monitoring: Secondary | ICD-10-CM | POA: Diagnosis not present

## 2017-07-23 LAB — POCT INR: INR: 3.3 — AB (ref 2.0–3.0)

## 2017-07-23 NOTE — Patient Instructions (Signed)
Decrease coumadin 1/2 tablet daily   Continue 2 servings each week of leafy green vegetables.  Recheck in 3 weeks

## 2017-07-24 ENCOUNTER — Other Ambulatory Visit: Payer: Self-pay

## 2017-07-24 MED ORDER — INSULIN PEN NEEDLE 31G X 5 MM MISC: 1.0000 | Freq: Every day | 3 refills | Status: DC

## 2017-07-28 ENCOUNTER — Other Ambulatory Visit: Payer: Self-pay | Admitting: Endocrinology

## 2017-07-28 ENCOUNTER — Ambulatory Visit: Payer: Medicaid Other | Admitting: Obstetrics & Gynecology

## 2017-07-28 ENCOUNTER — Encounter: Payer: Self-pay | Admitting: Obstetrics & Gynecology

## 2017-07-28 ENCOUNTER — Other Ambulatory Visit (HOSPITAL_COMMUNITY)
Admission: RE | Admit: 2017-07-28 | Discharge: 2017-07-28 | Disposition: A | Discharge status: Home / Self Care | Payer: Medicaid Other | Source: Ambulatory Visit | Attending: Obstetrics & Gynecology | Admitting: Obstetrics & Gynecology

## 2017-07-28 VITALS — BP 112/70 | HR 65 | Ht 64.0 in | Wt 182.0 lb

## 2017-07-28 DIAGNOSIS — Z1212 Encounter for screening for malignant neoplasm of rectum: Secondary | ICD-10-CM

## 2017-07-28 DIAGNOSIS — Z01419 Encounter for gynecological examination (general) (routine) without abnormal findings: Secondary | ICD-10-CM | POA: Diagnosis not present

## 2017-07-28 DIAGNOSIS — Z124 Encounter for screening for malignant neoplasm of cervix: Secondary | ICD-10-CM | POA: Diagnosis present

## 2017-07-28 DIAGNOSIS — Z1211 Encounter for screening for malignant neoplasm of colon: Secondary | ICD-10-CM | POA: Diagnosis not present

## 2017-07-28 LAB — HEMOCCULT GUIAC POC 1CARD (OFFICE): Fecal Occult Blood, POC: NEGATIVE

## 2017-07-28 NOTE — Progress Notes (Signed)
Subjective:     [name] is a 57 y.o. female here for a routine exam.  No LMP recorded. Patient is postmenopausal. G4P0 Birth Control Method:  Post menopausal Menstrual Calendar(currently): amenorrheic  Current complaints: none.   Current acute medical issues:  CHF   Recent Gynecologic History No LMP recorded. Patient is postmenopausal. Last Pap: 2017,  normal Last mammogram: 2018,  normal  Past Medical History:  Diagnosis Date  . Automatic implantable cardioverter-defibrillator in situ    a. 03/2013: BSX Energen CRTD BiV ICC, ser# [id number]  . Chronic anticoagulation    a. coumadin.  . Chronic atrial fibrillation (HCC)    embolic rind  . Chronic kidney disease    creatinine- 1.8 in 09/2006 and 2.0 in 05/2007; 2.05 in 2011, 2.29 in 2012  . Chronic systolic CHF (congestive heart failure) ([place])    a. 03/2013 Echo: Sev LV dysfxn with sev diff HK, restrictive phys, diast dysfxn, mild MR, sev dil LA/RA, mod PR, PASP 32mmHg.  . Congenital third degree heart block    a. Guidant VVI pacemaker implanted in 09/1997; b. gen change in 01/2004;  c. 03/2013 upgrade to [place], ser# [id number].  . Dizziness and giddiness 05/19/2012  . Dysphagia 08/14/2011   FEB 2013 EGD/DIL 16 MM; GERD; h/o gastroesophageal reflux disease; + H. Pylori gastritis   . GERD (gastroesophageal reflux disease)   . Helicobacter pylori gastritis 08/14/2011  . IDDM (insulin dependent diabetes mellitus) ([place])   . Kidney stones 1990's  . Nonischemic cardiomyopathy ([place])    a. 03/2013 Echo: Sev LV dysfxn with sev diff HK, restrictive phys, diast dysfxn, mild MR, sev dil LA/RA, mod PR, PASP 64mmHg;  b. 03/2013: BSX Energen CRTD BiV ICC, ser# [id number].  Marland Kitchen Potassium (K) excess   . Stroke [facility]) 1999   denies residual on 04/21/2013    Past Surgical History:  Procedure Laterality Date  . BI-VENTRICULAR IMPLANTABLE CARDIOVERTER DEFIBRILLATOR  (CRT-D)  04/21/2013  . BI-VENTRICULAR IMPLANTABLE CARDIOVERTER DEFIBRILLATOR  UPGRADE N/A 04/21/2013   Procedure: BI-VENTRICULAR IMPLANTABLE CARDIOVERTER DEFIBRILLATOR UPGRADE;  Surgeon: Evans Lance, MD;  Location: [facility] CATH LAB;  Service: Cardiovascular;  Laterality: N/A;  . COLONOSCOPY  04/23/2011   XLK:GMWNUUVOZD COLON POLYP/ INTERNAL HEMORRHOIDS/ TORTUOUS COLON  . INSERT / REPLACE / REMOVE PACEMAKER  09/1997   Guidant VVI [employer]  . PACEMAKER GENERATOR CHANGE  01/2004  . UPPER GASTROINTESTINAL ENDOSCOPY  FEB 2013   RING/DIL TO 16 MM, H PYLORI GASTRITIS    OB History    Gravida  4   Para      Term      Preterm      AB      Living  4     SAB      TAB      Ectopic      Multiple      Live Births              Social History   Socioeconomic History  . Marital status: Married    Spouse name: Not on file  . Number of children: 4  . Years of education: Not on file  . Highest education level: Not on file  Occupational History    Employer: UNEMPLOYED  Social Needs  . Financial resource strain: Not on file  . Food insecurity:    Worry: Not on file    Inability: Not on file  . Transportation needs:    Medical: Not on file  Non-medical: Not on file  Tobacco Use  . Smoking status: Never Smoker  . Smokeless tobacco: Never Used  . Tobacco comment: Never smoked  Substance and Sexual Activity  . Alcohol use: No    Alcohol/week: 0.0 oz  . Drug use: No  . Sexual activity: Not Currently    Birth control/protection: Post-menopausal  Lifestyle  . Physical activity:    Days per week: Not on file    Minutes per session: Not on file  . Stress: Not on file  Relationships  . Social connections:    Talks on phone: Not on file    Gets together: Not on file    Attends religious service: Not on file    Active member of club or organization: Not on file    Attends meetings of clubs or organizations: Not on file    Relationship status: Not on file  Other Topics Concern  . Not on file  Social History Narrative  . Not on file     Family History  Adopted: Yes  Problem Relation Age of Onset  . Diabetes Daughter   . Colon cancer Neg Hx   . Colon polyps Neg Hx      Current Outpatient Medications:  .  acetaminophen (TYLENOL) 500 MG tablet, Take 500 mg by mouth every 6 (six) hours as needed for moderate pain., Disp: , Rfl:  .  allopurinol (ZYLOPRIM) 100 MG tablet, Take 150 mg by mouth every morning. Reported on 06/15/2015, Disp: , Rfl:  .  benazepril (LOTENSIN) 20 MG tablet, Take 0.5 tablets (10 mg total) by mouth every morning. Takes 1/2 tablet, Disp: 45 tablet, Rfl: 3 .  COLCRYS 0.6 MG tablet, Take 0.6 mg by mouth daily as needed (gout flare). Reported on 06/15/2015, Disp: , Rfl:  .  COREG CR 40 MG 24 hr capsule, TAKE ONE CAPSULE BY MOUTH DAILY., Disp: 30 capsule, Rfl: 12 .  [place] 125 MCG tablet, TAKE 1 TABLET BY MOUTH DAILY EXCEPT ON SATURDAY AND SUNDAY., Disp: 20 tablet, Rfl: 11 .  furosemide (LASIX) 80 MG tablet, TAKE (1) TABLET BY MOUTH TWICE DAILY., Disp: 60 tablet, Rfl: 11 .  Insulin NPH, Human,, Isophane, (HUMULIN N KWIKPEN) 100 UNIT/ML Kiwkpen, INJECT 34 UNITS SUBCUTANEOUSLY AT BEDTIME. (Patient taking differently: According to sliding scale), Disp: 15 mL, Rfl: 3 .  Insulin Pen Needle 31G X 5 MM MISC, 1 each by Does not apply route daily. Use as instructed to check blood sugar 4 times daily., Disp: 200 each, Rfl: 3 .  JANTOVEN 5 MG tablet, Take 1/2 to 1 tablet daily as directed by Coumadin clinic, Disp: 20 tablet, Rfl: 2 .  LANOXIN 125 MCG tablet, TAKE 1 TABLET BY MOUTH DAILY EXCEPT ON SATURDAY AND SUNDAY., Disp: 90 tablet, Rfl: 3 .  liraglutide (VICTOZA) 18 MG/3ML SOPN, INJECT 1.2MG  SUBCUTANEOUSLY DAILY AS DIRECTED., Disp: 6 mL, Rfl: 0 .  NOVOLOG FLEXPEN 100 UNIT/ML FlexPen, INJECT SUBCUTANEOUSLY 12 UNITS BEFORE BREAKFAST; 14 UNITS AT LUNCH; AND 16 UNITS AT DINNER., Disp: 15 mL, Rfl: 0 .  TRESIBA FLEXTOUCH 200 UNIT/ML SOPN, INJECT 56 UNITS INTO THE SKIN DAILY BEFORE BREAKFAST., Disp: 9 mL, Rfl: 0  Review of  Systems  Review of Systems  Constitutional: Negative for fever, chills, weight loss, malaise/fatigue and diaphoresis.  HENT: Negative for hearing loss, ear pain, nosebleeds, congestion, sore throat, neck pain, tinnitus and ear discharge.   Eyes: Negative for blurred vision, double vision, photophobia, pain, discharge and redness.  Respiratory: Negative for cough, hemoptysis, sputum production,  shortness of breath, wheezing and stridor.   Cardiovascular: Negative for chest pain, palpitations, orthopnea, claudication, leg swelling and PND.  Gastrointestinal: negative for abdominal pain. Negative for heartburn, nausea, vomiting, diarrhea, constipation, blood in stool and melena.  Genitourinary: Negative for dysuria, urgency, frequency, hematuria and flank pain.  Musculoskeletal: Negative for myalgias, back pain, joint pain and falls.  Skin: Negative for itching and rash.  Neurological: Negative for dizziness, tingling, tremors, sensory change, speech change, focal weakness, seizures, loss of consciousness, weakness and headaches.  Endo/Heme/Allergies: Negative for environmental allergies and polydipsia. Does not bruise/bleed easily.  Psychiatric/Behavioral: Negative for depression, suicidal ideas, hallucinations, memory loss and substance abuse. The patient is not nervous/anxious and does not have insomnia.        Objective:  Blood pressure 112/70, pulse 65, height 5\' 4"  (1.626 m), weight 182 lb (82.6 kg).   Physical Exam  Vitals reviewed. Constitutional: She is oriented to person, place, and time. She appears well-developed and well-nourished.  HENT:  Head: Normocephalic and atraumatic.        Right Ear: External ear normal.  Left Ear: External ear normal.  Nose: Nose normal.  Mouth/Throat: Oropharynx is clear and moist.  Eyes: Conjunctivae and EOM are normal. Pupils are equal, round, and reactive to light. Right eye exhibits no discharge. Left eye exhibits no discharge. No scleral  icterus.  Neck: Normal range of motion. Neck supple. No tracheal deviation present. No thyromegaly present.  Cardiovascular: Normal rate, regular rhythm, normal heart sounds and intact distal pulses.  Exam reveals no gallop and no friction rub.   No murmur heard. Respiratory: Effort normal and breath sounds normal. No respiratory distress. She has no wheezes. She has no rales. She exhibits no tenderness.  GI: Soft. Bowel sounds are normal. She exhibits no distension and no mass. There is no tenderness. There is no rebound and no guarding.  Genitourinary:  Breasts no masses skin changes or nipple changes bilaterally      Vulva is normal without lesions Vagina is pink moist without discharge Cervix normal in appearance and pap is done Uterus is normal size shape and contour Adnexa is negative with normal sized ovaries  {Rectal    hemoccult negative, normal tone, no masses  Musculoskeletal: Normal range of motion. She exhibits no edema and no tenderness.  Neurological: She is alert and oriented to person, place, and time. She has normal reflexes. She displays normal reflexes. No cranial nerve deficit. She exhibits normal muscle tone. Coordination normal.  Skin: Skin is warm and dry. No rash noted. No erythema. No pallor.  Psychiatric: She has a normal mood and affect. Her behavior is normal. Judgment and thought content normal.       Medications Ordered at today's visit: No orders of the defined types were placed in this encounter.   Other orders placed at today's visit: No orders of the defined types were placed in this encounter.     Assessment:    Healthy female exam.    Plan:    Mammogram ordered. Follow up in: 2 years.     Return in about 2 years (around 07/29/2019).

## 2017-07-28 NOTE — Addendum Note (Signed)
Addended by: Linton Rump on: 07/28/2017 11:57 AM   Modules accepted: Orders

## 2017-07-30 LAB — CYTOLOGY - PAP
Diagnosis: NEGATIVE
HPV: NOT DETECTED

## 2017-07-31 ENCOUNTER — Ambulatory Visit (INDEPENDENT_AMBULATORY_CARE_PROVIDER_SITE_OTHER): Payer: Medicaid Other | Admitting: *Deleted

## 2017-07-31 ENCOUNTER — Telehealth: Payer: Self-pay

## 2017-07-31 ENCOUNTER — Other Ambulatory Visit: Payer: Self-pay | Admitting: Cardiovascular Disease

## 2017-07-31 ENCOUNTER — Other Ambulatory Visit: Payer: Self-pay | Admitting: Endocrinology

## 2017-07-31 DIAGNOSIS — I428 Other cardiomyopathies: Secondary | ICD-10-CM | POA: Diagnosis not present

## 2017-07-31 DIAGNOSIS — I5022 Chronic systolic (congestive) heart failure: Secondary | ICD-10-CM

## 2017-07-31 NOTE — Telephone Encounter (Signed)
[facility] called and PA was submitted and approved for Tresiba Flextouch 200unit/mL pen-injectors, 56 units daily.  PA # [id number] Interaction ID # [id number]

## 2017-07-31 NOTE — Progress Notes (Signed)
Remote ICD transmission.   

## 2017-08-01 ENCOUNTER — Ambulatory Visit: Payer: Medicaid Other

## 2017-08-10 NOTE — Progress Notes (Signed)
Patient ID: [name], female   DOB: [birth year], 57 y.o.   MRN: [id number]   Reason for Appointment : Follow up for Type 1 Diabetes  History of Present Illness           Diagnosis: Type 2 diabetes mellitus, date of diagnosis: 1990        Past history: She has been on insulin since 2007. Generally has had poor control in requiring large doses of insulin. Blood sugars also have been previously variable making her control difficult. She has had difficulty following instructions for mealtime insulin consistently in the past  Has been on a multiple injection regimen of Lantus twice a day, NovoLog a.c. and NPH at bedtime A1c has been as high as 11.3 previously   Recent history: INSULIN regimen is described as: [place] 46,  Humulin N 34-36, NovoLog a.c.12- 14-16 before meals  Her A1c is still 8.2   Glucose patterns , current management and problems identified:  She has not still not considered starting the Omnipod insulin pump that was discussed in the last visit and the visit before  She has consistently high A1c around 8%  Difficult to get a consistent blood sugar patterns since she is checking blood sugars mostly fasting  She has variable compliance with diet and does have periodic high readings in the evenings some of these are before eating also  She still gets few readings over 200 both at night and in the morning with no better  Even though her average blood sugar is 151 her A1c indicates overall higher readings which are likely to be postprandial  Currently not related to exercise or lose weight  Also she is sometimes adjusting her Tyler Aas or NPH arbitrarily when blood sugars are higher and lower  Also not adjusting her NovoLog based on carbohydrate content/her meal size  Glucose monitoring:  done  1-2 times a day         Glucometer:  Accu-Chek [employer]iva        Blood Glucose readings from meter download as above:    Physical activity: exercise: Walking some     Self-care: The diet that the patient has been following is low fat,  Lunch : Sometimes salad otherwise sandwiches  Meals: 3 meals per day.  breakfast is 9 am at lunch 12 pm Supper at about 6-7 PM, mealtimes are variable           Dietician visit: Most recent:.Several years ago  Last CD consult in 10/2013            Wt Readings from Last 3 Encounters:  08/11/17 187 lb (84.8 kg)  07/28/17 182 lb (82.6 kg)  05/07/17 187 lb 6.4 oz (85 kg)   A1c results  Lab Results  Component Value Date   HGBA1C 8.2 (A) 08/11/2017   HGBA1C 7.8 04/04/2017   HGBA1C 7.9 01/01/2017   Lab Results  Component Value Date   MICROALBUR 1.9 10/14/2014   LDLCALC 93 01/23/2015   CREATININE 2.60 (H) 03/03/2017     Lab Results  Component Value Date   MICROALBUR 1.9 10/14/2014     Allergies as of 08/11/2017      Reactions   Wheat Swelling   Latex Rash   Penicillins Rash   Sulfa Antibiotics Rash      Medication List        Accurate as of 08/11/17  1:27 PM. Always use your most recent med list.  acetaminophen 500 MG tablet Commonly known as:  TYLENOL Take 500 mg by mouth every 6 (six) hours as needed for moderate pain.   allopurinol 100 MG tablet Commonly known as:  ZYLOPRIM Take 150 mg by mouth every morning. Reported on 06/15/2015   benazepril 20 MG tablet Commonly known as:  LOTENSIN Take 0.5 tablets (10 mg total) by mouth every morning. Takes 1/2 tablet   COLCRYS 0.6 MG tablet Generic drug:  colchicine Take 0.6 mg by mouth daily as needed (gout flare). Reported on 06/15/2015   COREG CR 40 MG 24 hr capsule Generic drug:  carvedilol TAKE ONE CAPSULE BY MOUTH DAILY.   furosemide 80 MG tablet Commonly known as:  LASIX TAKE (1) TABLET BY MOUTH TWICE DAILY.   Insulin NPH (Human) (Isophane) 100 UNIT/ML Kiwkpen Commonly known as:  HUMULIN N KWIKPEN INJECT 34 UNITS SUBCUTANEOUSLY AT BEDTIME.   Insulin Pen Needle 31G X 5 MM Misc 1 each by Does not apply route daily. Use as  instructed to check blood sugar 4 times daily.   LANOXIN 0.125 MG tablet Generic drug:  digoxin TAKE 1 TABLET BY MOUTH DAILY EXCEPT ON SATURDAY AND SUNDAY.   DIGOX 0.125 MG tablet Generic drug:  digoxin TAKE 1 TABLET BY MOUTH DAILY EXCEPT ON SATURDAY AND SUNDAY.   liraglutide 18 MG/3ML Sopn Commonly known as:  VICTOZA INJECT 1.2MG  SUBCUTANEOUSLY DAILY AS DIRECTED.   NOVOLOG FLEXPEN 100 UNIT/ML FlexPen Generic drug:  insulin aspart INJECT SUBCUTANEOUSLY 12 UNITS BEFORE BREAKFAST; 14 UNITS AT LUNCH; AND 16 UNITS AT DINNER.   TRESIBA FLEXTOUCH 200 UNIT/ML Sopn Generic drug:  Insulin Degludec INJECT 56 UNITS INTO THE SKIN DAILY BEFORE BREAKFAST.   warfarin 5 MG tablet Commonly known as:  JANTOVEN Take as directed by the anticoagulation clinic. If you are unsure how to take this medication, talk to your nurse or doctor. Original instructions:  TAKE 1/2 TO 1 TABLET BY MOUTH DAILY AS DIRECTED BY COUMADIN CLINIC.       Allergies:  Allergies  Allergen Reactions  . Wheat Swelling  . Latex Rash  . Penicillins Rash  . Sulfa Antibiotics Rash    Past Medical History:  Diagnosis Date  . Automatic implantable cardioverter-defibrillator in situ    a. 03/2013: BSX Energen CRTD BiV ICC, ser# [id number]  . Chronic anticoagulation    a. coumadin.  . Chronic atrial fibrillation (HCC)    embolic rind  . Chronic kidney disease    creatinine- 1.8 in 09/2006 and 2.0 in 05/2007; 2.05 in 2011, 2.29 in 2012  . Chronic systolic CHF (congestive heart failure) ([place])    a. 03/2013 Echo: Sev LV dysfxn with sev diff HK, restrictive phys, diast dysfxn, mild MR, sev dil LA/RA, mod PR, PASP 36mmHg.  . Congenital third degree heart block    a. Guidant VVI pacemaker implanted in 09/1997; b. gen change in 01/2004;  c. 03/2013 upgrade to [place], ser# [id number].  . Dizziness and giddiness 05/19/2012  . Dysphagia 08/14/2011   FEB 2013 EGD/DIL 16 MM; GERD; h/o gastroesophageal reflux disease; + H.  Pylori gastritis   . GERD (gastroesophageal reflux disease)   . Helicobacter pylori gastritis 08/14/2011  . IDDM (insulin dependent diabetes mellitus) ([place])   . Kidney stones 1990's  . Nonischemic cardiomyopathy ([place])    a. 03/2013 Echo: Sev LV dysfxn with sev diff HK, restrictive phys, diast dysfxn, mild MR, sev dil LA/RA, mod PR, PASP 61mmHg;  b. 03/2013: BSX Energen CRTD BiV ICC, ser# [id number].  Marland Kitchen  Potassium (K) excess   . Stroke [facility]) 1999   denies residual on 04/21/2013    Past Surgical History:  Procedure Laterality Date  . BI-VENTRICULAR IMPLANTABLE CARDIOVERTER DEFIBRILLATOR  (CRT-D)  04/21/2013  . BI-VENTRICULAR IMPLANTABLE CARDIOVERTER DEFIBRILLATOR UPGRADE N/A 04/21/2013   Procedure: BI-VENTRICULAR IMPLANTABLE CARDIOVERTER DEFIBRILLATOR UPGRADE;  Surgeon: Evans Lance, MD;  Location: [facility] C[facility] LAB;  Service: Cardiovascular;  Laterality: N/A;  . COLONOSCOPY  04/23/2011   [id number] COLON POLYP/ INTERNAL HEMORRHOIDS/ TORTUOUS COLON  . INSERT / REPLACE / REMOVE PACEMAKER  09/1997   Guidant VVI [employer]  . PACEMAKER GENERATOR CHANGE  01/2004  . UPPER GASTROINTESTINAL ENDOSCOPY  FEB 2013   RING/DIL TO 16 MM, H PYLORI GASTRITIS    Family History  Adopted: Yes  Problem Relation Age of Onset  . Diabetes Daughter   . Colon cancer Neg Hx   . Colon polyps Neg Hx     Social History:  reports that she has never smoked. She has never used smokeless tobacco. She reports that she does not drink alcohol or use drugs.    Review of Systems:   She has had stable chronic renal insufficiency, followed by nephrologist .  No history of microalbuminuria   Lab Results  Component Value Date   CREATININE 2.60 (H) 03/03/2017   BUN 63 (H) 03/03/2017   NA 137 03/03/2017   K 4.8 03/03/2017   CL 103 03/03/2017   CO2 22 03/03/2017    She has had cardiomyopathy and History of atrial fibrillation followed by cardiologist  Lipids: Has dyslipidemia with low HDL, high triglycerides   followed by PCP  Lab Results  Component Value Date   CHOL 131 09/22/2015   HDL 23.30 (L) 09/22/2015   LDLCALC 93 01/23/2015   LDLDIRECT 65.0 09/22/2015   TRIG 247.0 (H) 09/22/2015   CHOLHDL 6 09/22/2015    Foot exam in 1/18 normal except absent pedal pulses on the right     Physical Examination:  BP 108/78 (BP Location: Left Arm, Patient Position: Sitting, Cuff Size: Normal)   Pulse 63   Ht 5\' 4"  (1.626 m)   Wt 187 lb (84.8 kg)   SpO2 98%   BMI 32.10 kg/m   ASSESSMENT:  Diabetes type 1:  See history of present illness for detailed discussion of her current blood sugar patterns, treatment regimen, meal planning and problems identified  Her A1c has gone up slightly and is 8.2  She has inconsistent blood sugars even fasting and not enough glucose monitoring Most of her variability is likely to be related to variable meals and not adequate assessment of how to cover these meals with NovoLog doses Also she may sometimes adjust her Tyler Aas and NPH arbitrarily   CKD: Followed by nephrologist  PLAN:    Showed her the benefits of the Omnipod insulin pump compared to what she is doing now and she is still unsure about this  However she agrees to keep a record of her food eaten, blood sugars before and after eating and NovoLog doses to be reviewed by nurse educator to help her with gradually adjustment of her mealtime dose  This will also encouraged her to check her blood sugars more often after meals which she is not doing  Continue Victoza  No change in long-acting insulins  There are no Patient Instructions on file for this visit.        Elayne Snare 08/11/2017, 1:27 PM

## 2017-08-11 ENCOUNTER — Encounter: Payer: Self-pay | Admitting: Endocrinology

## 2017-08-11 ENCOUNTER — Ambulatory Visit (INDEPENDENT_AMBULATORY_CARE_PROVIDER_SITE_OTHER): Payer: Medicaid Other | Admitting: Endocrinology

## 2017-08-11 ENCOUNTER — Encounter

## 2017-08-11 ENCOUNTER — Other Ambulatory Visit: Payer: Self-pay

## 2017-08-11 VITALS — BP 108/78 | HR 63 | Ht 64.0 in | Wt 187.0 lb

## 2017-08-11 DIAGNOSIS — Z794 Long term (current) use of insulin: Secondary | ICD-10-CM | POA: Diagnosis not present

## 2017-08-11 DIAGNOSIS — E1165 Type 2 diabetes mellitus with hyperglycemia: Secondary | ICD-10-CM

## 2017-08-11 LAB — POCT GLYCOSYLATED HEMOGLOBIN (HGB A1C): Hemoglobin A1C: 8.2 % — AB (ref 4.0–5.6)

## 2017-08-11 MED ORDER — INSULIN ISOPHANE HUMAN 100 UNIT/ML KWIKPEN: PEN_INJECTOR | SUBCUTANEOUS | 3 refills | Status: DC

## 2017-08-11 NOTE — Patient Instructions (Addendum)
Must do more sugars 2-3hrs after meals  Take 32 N at [place]

## 2017-08-22 ENCOUNTER — Ambulatory Visit (INDEPENDENT_AMBULATORY_CARE_PROVIDER_SITE_OTHER): Payer: Medicaid Other | Admitting: *Deleted

## 2017-08-22 DIAGNOSIS — I4891 Unspecified atrial fibrillation: Secondary | ICD-10-CM | POA: Diagnosis not present

## 2017-08-22 DIAGNOSIS — Z5181 Encounter for therapeutic drug level monitoring: Secondary | ICD-10-CM | POA: Diagnosis not present

## 2017-08-22 LAB — POCT INR: INR: 2.4 (ref 2.0–3.0)

## 2017-08-22 NOTE — Patient Instructions (Signed)
Continue coumadin 1/2 tablet daily Continue greens as usual Recheck in 4 weeks

## 2017-08-25 ENCOUNTER — Other Ambulatory Visit: Payer: Self-pay | Admitting: Endocrinology

## 2017-09-12 ENCOUNTER — Ambulatory Visit (INDEPENDENT_AMBULATORY_CARE_PROVIDER_SITE_OTHER): Payer: Medicaid Other | Admitting: Internal Medicine

## 2017-09-12 ENCOUNTER — Encounter: Payer: Self-pay | Admitting: Internal Medicine

## 2017-09-12 VITALS — BP 118/76 | HR 60 | Ht 66.0 in | Wt 178.4 lb

## 2017-09-12 DIAGNOSIS — I5022 Chronic systolic (congestive) heart failure: Secondary | ICD-10-CM

## 2017-09-12 DIAGNOSIS — I482 Chronic atrial fibrillation, unspecified: Secondary | ICD-10-CM

## 2017-09-12 DIAGNOSIS — Q246 Congenital heart block: Secondary | ICD-10-CM | POA: Diagnosis not present

## 2017-09-12 LAB — CUP PACEART INCLINIC DEVICE CHECK
HIGH POWER IMPEDANCE MEASURED VALUE: 69 Ohm
Implantable Lead Implant Date: 20150225
Implantable Lead Location: 753860
Implantable Lead Serial Number: 311545
Implantable Pulse Generator Implant Date: 20150225
Lead Channel Pacing Threshold Amplitude: 0.6 V
Lead Channel Pacing Threshold Pulse Width: 0.5 ms
Lead Channel Pacing Threshold Pulse Width: 0.5 ms
Lead Channel Setting Pacing Amplitude: 2.4 V
Lead Channel Setting Pacing Pulse Width: 0.5 ms
Lead Channel Setting Sensing Sensitivity: 0.6 mV
Lead Channel Setting Sensing Sensitivity: 1 mV
MDC IDC LEAD IMPLANT DT: 20150225
MDC IDC LEAD LOCATION: 753858
MDC IDC MSMT LEADCHNL LV IMPEDANCE VALUE: 413 Ohm
MDC IDC MSMT LEADCHNL RA IMPEDANCE VALUE: 2500 Ohm — AB
MDC IDC MSMT LEADCHNL RV IMPEDANCE VALUE: 507 Ohm
MDC IDC MSMT LEADCHNL RV PACING THRESHOLD AMPLITUDE: 0.5 V
MDC IDC PG SERIAL: 114945
MDC IDC SESS DTM: 20190719040000
MDC IDC SET LEADCHNL LV PACING AMPLITUDE: 2.4 V
MDC IDC SET LEADCHNL LV PACING PULSEWIDTH: 0.5 ms

## 2017-09-12 NOTE — Patient Instructions (Signed)
Medication Instructions:  Your physician recommends that you continue on your current medications as directed. Please refer to the Current Medication list given to you today.   Labwork: NONE   Testing/Procedures: NONE   Follow-Up: Your physician wants you to follow-up in: 1 Year with Dr. Lovena Le. You will receive a reminder letter in the mail two months in advance. If you don't receive a letter, please call our office to schedule the follow-up appointment.  Your physician recommends that you schedule a follow-up appointment on 10/30/17   Any Other Special Instructions Will Be Listed Below (If Applicable).     If you need a refill on your cardiac medications before your next appointment, please call your pharmacy.  Thank you for choosing [facility]!
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
## 2017-09-19 LAB — CUP PACEART REMOTE DEVICE CHECK
Battery Remaining Longevity: 72 mo
Battery Remaining Percentage: 100 %
Brady Statistic RV Percent Paced: 98 %
Date Time Interrogation Session: 20190606060400
HIGH POWER IMPEDANCE MEASURED VALUE: 62 Ohm
Implantable Lead Implant Date: 20150225
Implantable Lead Location: 753858
Implantable Lead Location: 753860
Implantable Lead Model: 180
Implantable Lead Serial Number: 311545
Lead Channel Impedance Value: 480 Ohm
Lead Channel Pacing Threshold Amplitude: 0.4 V
Lead Channel Pacing Threshold Amplitude: 0.6 V
Lead Channel Pacing Threshold Pulse Width: 0.5 ms
Lead Channel Pacing Threshold Pulse Width: 0.5 ms
Lead Channel Setting Pacing Amplitude: 2.4 V
Lead Channel Setting Pacing Pulse Width: 0.5 ms
Lead Channel Setting Sensing Sensitivity: 0.6 mV
Lead Channel Setting Sensing Sensitivity: 1 mV
MDC IDC LEAD IMPLANT DT: 20150225
MDC IDC MSMT LEADCHNL LV IMPEDANCE VALUE: 402 Ohm
MDC IDC PG IMPLANT DT: 20150225
MDC IDC SET LEADCHNL RV PACING AMPLITUDE: 2.4 V
MDC IDC SET LEADCHNL RV PACING PULSEWIDTH: 0.5 ms
MDC IDC STAT BRADY RA PERCENT PACED: 0 %
Pulse Gen Serial Number: 114945

## 2017-09-22 ENCOUNTER — Encounter: Payer: Medicaid Other | Admitting: Nutrition

## 2017-09-24 ENCOUNTER — Other Ambulatory Visit: Payer: Self-pay | Admitting: Endocrinology

## 2017-10-08 ENCOUNTER — Encounter: Payer: Self-pay | Admitting: Gastroenterology

## 2017-10-08 ENCOUNTER — Ambulatory Visit (INDEPENDENT_AMBULATORY_CARE_PROVIDER_SITE_OTHER): Payer: Medicaid Other | Admitting: Gastroenterology

## 2017-10-08 ENCOUNTER — Other Ambulatory Visit: Payer: Self-pay

## 2017-10-08 DIAGNOSIS — A048 Other specified bacterial intestinal infections: Secondary | ICD-10-CM

## 2017-10-08 DIAGNOSIS — R1013 Epigastric pain: Secondary | ICD-10-CM

## 2017-10-08 DIAGNOSIS — D126 Benign neoplasm of colon, unspecified: Secondary | ICD-10-CM | POA: Diagnosis not present

## 2017-10-08 DIAGNOSIS — B9681 Helicobacter pylori [H. pylori] as the cause of diseases classified elsewhere: Secondary | ICD-10-CM

## 2017-10-08 DIAGNOSIS — K2970 Gastritis, unspecified, without bleeding: Secondary | ICD-10-CM

## 2017-10-08 NOTE — Progress Notes (Signed)
Subjective:    Patient ID: [name], female    DOB: [birth year], 57 y.o.   MRN: [id number]  [name], MD  HPI [facility] HER APPETITE IS NOT VERY GOOD. MAY GET UPSET STOMACH. WEIGHT DOWN AFTER GETTING ON NEW DIURETIC. WEIGHT NOT DOWN FROM 190 LBS TO 176 LBS. IN 2017 WEIGHED 179 LBS. STARTED ON ZAROXYLYN. ACID REFLUX CONTROLLED. GETS BURPS OFF AND ON AFTER EATING CERTAIN THINGS: LEMONADE. BMs: GOOD.  PT DENIES FEVER, CHILLS, HEMATOCHEZIA, nausea, vomiting, melena, diarrhea, CHEST PAIN, SHORTNESS OF BREATH, CHANGE IN BOWEL IN HABITS, constipation, abdominal pain, problems swallowing, OR heartburn or indigestion.  Past Medical History:  Diagnosis Date  . Automatic implantable cardioverter-defibrillator in situ    a. 03/2013: BSX Energen CRTD BiV ICC, ser# [id number]  . Chronic anticoagulation    a. coumadin.  . Chronic atrial fibrillation (HCC)    embolic rind  . Chronic kidney disease    creatinine- 1.8 in 09/2006 and 2.0 in 05/2007; 2.05 in 2011, 2.29 in 2012  . Chronic systolic CHF (congestive heart failure) ([place])    a. 03/2013 Echo: Sev LV dysfxn with sev diff HK, restrictive phys, diast dysfxn, mild MR, sev dil LA/RA, mod PR, PASP 21mmHg.  . Congenital third degree heart block    a. Guidant VVI pacemaker implanted in 09/1997; b. gen change in 01/2004;  c. 03/2013 upgrade to [place], ser# [id number].  . Dizziness and giddiness 05/19/2012  . Dysphagia 08/14/2011   FEB 2013 EGD/DIL 16 MM; GERD; h/o gastroesophageal reflux disease; + H. Pylori gastritis   . GERD (gastroesophageal reflux disease)   . Helicobacter pylori gastritis 08/14/2011  . IDDM (insulin dependent diabetes mellitus) ([place])   . Kidney stones 1990's  . Nonischemic cardiomyopathy ([place])    a. 03/2013 Echo: Sev LV dysfxn with sev diff HK, restrictive phys, diast dysfxn, mild MR, sev dil LA/RA, mod PR, PASP 32mmHg;  b. 03/2013: BSX Energen CRTD BiV ICC, ser# [id number].  Marland Kitchen Potassium (K) excess   . Stroke [facility]) 1999   denies residual on 04/21/2013    Past Surgical History:  Procedure Laterality Date  . BI-VENTRICULAR IMPLANTABLE CARDIOVERTER DEFIBRILLATOR  (CRT-D)  04/21/2013  . BI-VENTRICULAR IMPLANTABLE CARDIOVERTER DEFIBRILLATOR UPGRADE N/A 04/21/2013   Procedure: BI-VENTRICULAR IMPLANTABLE CARDIOVERTER DEFIBRILLATOR UPGRADE;  Surgeon: Evans Lance, MD;  Location: [facility] CATH LAB;  Service: Cardiovascular;  Laterality: N/A;  . COLONOSCOPY  04/23/2011   [id number] COLON POLYP/ INTERNAL HEMORRHOIDS/ TORTUOUS COLON  . INSERT / REPLACE / REMOVE PACEMAKER  09/1997   Guidant VVI [employer]  . PACEMAKER GENERATOR CHANGE  01/2004  . UPPER GASTROINTESTINAL ENDOSCOPY  FEB 2013   RING/DIL TO 16 MM, H PYLORI GASTRITIS   Allergies  Allergen Reactions  . Wheat Swelling  . Latex Rash  . Penicillins Rash  . Sulfa Antibiotics Rash    Current Outpatient Medications  Medication Sig    . acetaminophen (TYLENOL) 500 MG tablet Take 500 mg by mouth every 6 (six) hours as needed for moderate pain.    Marland Kitchen allopurinol (ZYLOPRIM) 100 MG tablet Take 150 mg by mouth every morning. Reported on 06/15/2015    . benazepril (LOTENSIN) 10 MG tablet Take 10 mg by mouth daily.    . calcitRIOL 0.25 MCG capsule Take 0.25 mcg by mouth daily.    Marland Kitchen COREG CR 40 MG 24 hr capsule TAKE ONE CAPSULE BY MOUTH DAILY.    Marland Kitchen DIGOX 125 MCG tablet TAKE 1 TABLET BY MOUTH DAILY EXCEPT ON  SATURDAY AND SUNDAY.    . furosemide (LASIX) 80 MG tablet TAKE (1) TABLET BY MOUTH TWICE DAILY. (Patient taking differently: Take 80 mg by mouth daily. )    . Insulin NPH, Human,, Isophane, (HUMULIN N KWIKPEN) 100 UNIT/ML Kiwkpen Inject 34 units under the skin daily, according to sliding scale.    . Insulin Pen Needle 31G X 5 MM MISC 1 each by Does not apply route daily. Use as instructed to check blood sugar 4 times daily.    Marland Kitchen liraglutide (VICTOZA) 18 MG/3ML SOPN INJECT 1.2MG  SUBCUTANEOUSLY DAILY AS DIRECTED.    Marland Kitchen metolazone (ZAROXOLYN) 2.5 MG tablet Take 2.5  mg by mouth daily.    Marland Kitchen NOVOLOG FLEXPEN 100 UNIT/ML FlexPen INJECT SUBCUTANEOUSLY 12 UNITS BEFORE BREAKFAST; 14 UNITS AT LUNCH; AND 16 UNITS AT DINNER.    . TRESIBA FLEXTOUCH 200 UNIT/ML SOPN INJECT 56 UNITS INTO THE SKIN DAILY BEFORE BREAKFAST.    Marland Kitchen warfarin (JANTOVEN) 5 MG tablet  Take 2.5 mg by mouth daily.      Review of Systems PER HPI OTHERWISE ALL SYSTEMS ARE NEGATIVE.    Objective:   Physical Exam  Constitutional: She is oriented to person, place, and time. She appears well-developed and well-nourished. No distress.  HENT:  Head: Normocephalic and atraumatic.  Mouth/Throat: Oropharynx is clear and moist. No oropharyngeal exudate.  Eyes: Pupils are equal, round, and reactive to light. No scleral icterus.  Neck: Normal range of motion. Neck supple.  Cardiovascular: Normal rate and regular rhythm.  Murmur (systolic, S4 PRESENT) heard. Pulmonary/Chest: Effort normal and breath sounds normal. No respiratory distress.  Abdominal: Soft. Bowel sounds are normal. She exhibits no distension. There is no tenderness.  Musculoskeletal: She exhibits edema (trace pedal edema bilaterally).  VARICOSITIES  BILATERAL LOWER EXTREMITIES  Lymphadenopathy:    She has no cervical adenopathy.  Neurological: She is alert and oriented to person, place, and time.  NO  NEW FOCAL DEFICITS  Psychiatric: She has a normal mood and affect.  Vitals reviewed.     Assessment & Plan:

## 2017-10-08 NOTE — Assessment & Plan Note (Signed)
SYMPTOMS FAIRLY WELL CONTROLLED AND MAY BE EXACERBATED BY UREMIA.  I WILL OBTAIN AND REVIEW LABS FROM DR. BEFAKADU. PLEASE CALL WITH QUESTIONS OR CONCERNS.  FOLLOW UP IN 6 MOS

## 2017-10-08 NOTE — Assessment & Plan Note (Signed)
NO BRBPR OR MELENA.  SCHEDULE NEXT COLONOSCOPY AFTER YOUR NEXT OUTPATIENT VISIT.

## 2017-10-08 NOTE — Assessment & Plan Note (Signed)
SYMPTOMS CONTROLLED/RESOLVED. ERADICATION NOT DOCUMENTED.  COMPLETE H PYLORI BREATH TEST. PLEASE CALL WITH QUESTIONS OR CONCERNS.  FOLLOW UP IN 6 MOS

## 2017-10-08 NOTE — Patient Instructions (Addendum)
COMPLETE H PYLORI BREATH TEST.  I WILL OBTAIN AND REVIEW LABS FROM DR. BEFAKADU.    PLEASE CALL WITH QUESTIONS OR CONCERNS.  FOLLOW UP IN 6 MOS.  I WILL SCHEDULE YOUR NEXT COLONOSCOPY AFTER YOUR NEXT OUTPATIENT VISIT.

## 2017-10-17 ENCOUNTER — Ambulatory Visit (INDEPENDENT_AMBULATORY_CARE_PROVIDER_SITE_OTHER): Payer: Medicaid Other | Admitting: *Deleted

## 2017-10-17 DIAGNOSIS — Z5181 Encounter for therapeutic drug level monitoring: Secondary | ICD-10-CM | POA: Diagnosis not present

## 2017-10-17 DIAGNOSIS — I4891 Unspecified atrial fibrillation: Secondary | ICD-10-CM

## 2017-10-17 LAB — POCT INR: INR: 2.1 (ref 2.0–3.0)

## 2017-10-17 NOTE — Patient Instructions (Signed)
Description   Continue coumadin 1/2 tablet daily Continue greens as usual Recheck in 4 weeks

## 2017-10-28 ENCOUNTER — Other Ambulatory Visit: Payer: Self-pay | Admitting: Endocrinology

## 2017-10-30 ENCOUNTER — Ambulatory Visit (INDEPENDENT_AMBULATORY_CARE_PROVIDER_SITE_OTHER): Payer: Medicaid Other | Admitting: *Deleted

## 2017-10-30 DIAGNOSIS — I5022 Chronic systolic (congestive) heart failure: Secondary | ICD-10-CM | POA: Diagnosis not present

## 2017-10-30 DIAGNOSIS — Q246 Congenital heart block: Secondary | ICD-10-CM

## 2017-10-30 NOTE — Progress Notes (Signed)
Remote ICD transmission.   

## 2017-11-11 ENCOUNTER — Ambulatory Visit (INDEPENDENT_AMBULATORY_CARE_PROVIDER_SITE_OTHER): Payer: Medicaid Other | Admitting: Endocrinology

## 2017-11-11 ENCOUNTER — Encounter: Payer: Self-pay | Admitting: Endocrinology

## 2017-11-11 VITALS — BP 100/64 | HR 60 | Temp 98.0°F | Resp 16 | Ht 66.0 in | Wt 170.2 lb

## 2017-11-11 DIAGNOSIS — Z794 Long term (current) use of insulin: Secondary | ICD-10-CM | POA: Diagnosis not present

## 2017-11-11 DIAGNOSIS — E1165 Type 2 diabetes mellitus with hyperglycemia: Secondary | ICD-10-CM

## 2017-11-11 LAB — POCT GLYCOSYLATED HEMOGLOBIN (HGB A1C): Hemoglobin A1C: 8.3 % — AB (ref 4.0–5.6)

## 2017-11-11 NOTE — Patient Instructions (Addendum)
Check blood sugars on waking up  3/7  Also check blood sugars about 2 hours after a meal and do this after different meals by rotation  Recommended blood sugar levels on waking up is 90-130 and about 2 hours after meal is 130-160  Please bring your blood sugar monitor to each visit, thank you  Take N insulin at 11 pm daily

## 2017-11-11 NOTE — Progress Notes (Signed)
Patient ID: [name], female   DOB: [birth year], 57 y.o.   MRN: [id number]   Reason for Appointment : Follow up for Type 1 Diabetes  History of Present Illness           Diagnosis: Type 2 diabetes mellitus, date of diagnosis: 1990        Past history: She has been on insulin since 2007. Generally has had poor control in requiring large doses of insulin. Blood sugars also have been previously variable making her control difficult. She has had difficulty following instructions for mealtime insulin consistently in the past  Has been on a multiple injection regimen of Lantus twice a day, NovoLog a.c. and NPH at bedtime A1c has been as high as 11.3 previously   Recent history: INSULIN regimen is described as: [place] 46,  Humulin N 34 , NovoLog a.c.12- 14-16 before meals  Her A1c is still 8.3  Glucose patterns , current management and problems identified:  She was supposed to see the diabetes educator after her last visit to help her with day-to-day management including mealtime insulin but she did not make an appointment  Has not still not considered starting the Omnipod insulin pump that was discussed at least twice before  Although her A1c is consistently high she only has a blood sugar average of 141 at home  However has been checking blood sugars very inconsistently and mostly in the mornings and sometimes later at night  Blood sugars are also showing significant fluctuation including overnight in the morning readings  She may occasionally forget to take her NPH at bedtime which may cause somewhat higher readings in the mornings over 200 at times  She says she has very variable schedule although she does try to take her NovoLog with her when she is eating out  She will take her insulin while she is eating all right after  POSTPRANDIAL readings are difficult to assess but they are quite variable at night, not clear which readings in the evenings are before or after  eating  Is also difficult to know which of her morning readings are after breakfast or before  Also not adjusting her NovoLog based on carbohydrate content/or the meal size  She has however lost weight possibly from overall decrease intake and more regular walking  Glucose monitoring:  done  1-2 times a day         Glucometer:  Accu-Chek Aviva        Blood Glucose readings from meter download   PRE-MEAL  8-10 AM Lunch Dinner Bedtime Overall  Glucose range:  67-286    71-267   Mean/median:  131  146   161 141 5/-54   POST-MEAL PC Breakfast PC Lunch  overnight  Glucose range:    77-212  Mean/median:    146    Physical activity: exercise: Walking somewhat more regularly  Self-care: The diet that the patient has been following is low fat,  Lunch : Sometimes salad otherwise sandwiches  Meals: 3 meals per day.  breakfast is 9 am at lunch 12 pm Supper at about 6-7 PM, mealtimes are variable           Dietician visit: Most recent:.Several years ago  Last CDE consult in 10/2013            Wt Readings from Last 3 Encounters:  11/11/17 170 lb 3.2 oz (77.2 kg)  10/08/17 176 lb (79.8 kg)  09/12/17 178 lb 6.4 oz (80.9 kg)  A1c results  Lab Results  Component Value Date   HGBA1C 8.3 (A) 11/11/2017   HGBA1C 8.2 (A) 08/11/2017   HGBA1C 7.8 04/04/2017   Lab Results  Component Value Date   MICROALBUR 1.9 10/14/2014   LDLCALC 93 01/23/2015   CREATININE 2.60 (H) 03/03/2017     Lab Results  Component Value Date   MICROALBUR 1.9 10/14/2014     Allergies as of 11/11/2017      Reactions   Wheat Swelling   Latex Rash   Penicillins Rash   Sulfa Antibiotics Rash      Medication List        Accurate as of 11/11/17 11:28 AM. Always use your most recent med list.          acetaminophen 500 MG tablet Commonly known as:  TYLENOL Take 500 mg by mouth every 6 (six) hours as needed for moderate pain.   allopurinol 100 MG tablet Commonly known as:  ZYLOPRIM Take 150 mg by  mouth every morning. Reported on 06/15/2015   benazepril 10 MG tablet Commonly known as:  LOTENSIN Take 10 mg by mouth daily.   calcitRIOL 0.25 MCG capsule Commonly known as:  ROCALTROL Take 0.25 mcg by mouth daily.   COREG CR 40 MG 24 hr capsule Generic drug:  carvedilol TAKE ONE CAPSULE BY MOUTH DAILY.   DIGOX 0.125 MG tablet Generic drug:  digoxin TAKE 1 TABLET BY MOUTH DAILY EXCEPT ON SATURDAY AND SUNDAY.   furosemide 80 MG tablet Commonly known as:  LASIX TAKE (1) TABLET BY MOUTH TWICE DAILY.   Insulin NPH (Human) (Isophane) 100 UNIT/ML Kiwkpen Commonly known as:  HUMULIN N Inject 34 units under the skin daily, according to sliding scale.   Insulin Pen Needle 31G X 5 MM Misc 1 each by Does not apply route daily. Use as instructed to check blood sugar 4 times daily.   liraglutide 18 MG/3ML Sopn Commonly known as:  VICTOZA INJECT 1.2MG  SUBCUTANEOUSLY DAILY AS DIRECTED.   metolazone 2.5 MG tablet Commonly known as:  ZAROXOLYN Take 2.5 mg by mouth daily.   NOVOLOG FLEXPEN 100 UNIT/ML FlexPen Generic drug:  insulin aspart INJECT SUBCUTANEOUSLY 12 UNITS BEFORE BREAKFAST; 14 UNITS AT LUNCH; AND 16 UNITS AT DINNER.   TRESIBA FLEXTOUCH 200 UNIT/ML Sopn Generic drug:  Insulin Degludec INJECT 56 UNITS INTO THE SKIN DAILY BEFORE BREAKFAST.   warfarin 5 MG tablet Commonly known as:  COUMADIN Take as directed by the anticoagulation clinic. If you are unsure how to take this medication, talk to your nurse or doctor. Original instructions:  TAKE 1/2 TO 1 TABLET BY MOUTH DAILY AS DIRECTED BY COUMADIN CLINIC.       Allergies:  Allergies  Allergen Reactions  . Wheat Swelling  . Latex Rash  . Penicillins Rash  . Sulfa Antibiotics Rash    Past Medical History:  Diagnosis Date  . Automatic implantable cardioverter-defibrillator in situ    a. 03/2013: BSX Energen CRTD BiV ICC, ser# [id number]  . Chronic anticoagulation    a. coumadin.  . Chronic atrial fibrillation (HCC)      embolic rind  . Chronic kidney disease    creatinine- 1.8 in 09/2006 and 2.0 in 05/2007; 2.05 in 2011, 2.29 in 2012  . Chronic systolic CHF (congestive heart failure) ([place])    a. 03/2013 Echo: Sev LV dysfxn with sev diff HK, restrictive phys, diast dysfxn, mild MR, sev dil LA/RA, mod PR, PASP 64mmHg.  . Congenital third degree heart block  a. Guidant VVI pacemaker implanted in 09/1997; b. gen change in 01/2004;  c. 03/2013 upgrade to [place], ser# [id number].  . Dizziness and giddiness 05/19/2012  . Dysphagia 08/14/2011   FEB 2013 EGD/DIL 16 MM; GERD; h/o gastroesophageal reflux disease; + H. Pylori gastritis   . GERD (gastroesophageal reflux disease)   . Helicobacter pylori gastritis 08/14/2011  . IDDM (insulin dependent diabetes mellitus) ([place])   . Kidney stones 1990's  . Nonischemic cardiomyopathy ([place])    a. 03/2013 Echo: Sev LV dysfxn with sev diff HK, restrictive phys, diast dysfxn, mild MR, sev dil LA/RA, mod PR, PASP 81mmHg;  b. 03/2013: BSX Energen CRTD BiV ICC, ser# [id number].  Marland Kitchen Potassium (K) excess   . Stroke [facility]) 1999   denies residual on 04/21/2013    Past Surgical History:  Procedure Laterality Date  . BI-VENTRICULAR IMPLANTABLE CARDIOVERTER DEFIBRILLATOR  (CRT-D)  04/21/2013  . BI-VENTRICULAR IMPLANTABLE CARDIOVERTER DEFIBRILLATOR UPGRADE N/A 04/21/2013   Procedure: BI-VENTRICULAR IMPLANTABLE CARDIOVERTER DEFIBRILLATOR UPGRADE;  Surgeon: Evans Lance, MD;  Location: [facility] CATH LAB;  Service: Cardiovascular;  Laterality: N/A;  . COLONOSCOPY  04/23/2011   YTK:ZSWFUXNATF COLON POLYP/ INTERNAL HEMORRHOIDS/ TORTUOUS COLON  . INSERT / REPLACE / REMOVE PACEMAKER  09/1997   Guidant VVI [employer]  . PACEMAKER GENERATOR CHANGE  01/2004  . UPPER GASTROINTESTINAL ENDOSCOPY  FEB 2013   RING/DIL TO 16 MM, H PYLORI GASTRITIS    Family History  Adopted: Yes  Problem Relation Age of Onset  . Diabetes Daughter   . Colon cancer Neg Hx   . Colon polyps Neg Hx     Social  History:  reports that she has never smoked. She has never used smokeless tobacco. She reports that she does not drink alcohol or use drugs.    Review of Systems:   She has had chronic renal insufficiency, followed by nephrologist .  No history of microalbuminuria   Lab Results  Component Value Date   CREATININE 2.60 (H) 03/03/2017   BUN 63 (H) 03/03/2017   NA 137 03/03/2017   K 4.8 03/03/2017   CL 103 03/03/2017   CO2 22 03/03/2017    She has had cardiomyopathy and History of atrial fibrillation followed by cardiologist  Lipids: Has dyslipidemia with low HDL, high triglycerides  followed by PCP, not clear if she had recent labs  Lab Results  Component Value Date   CHOL 131 09/22/2015   HDL 23.30 (L) 09/22/2015   LDLCALC 93 01/23/2015   LDLDIRECT 65.0 09/22/2015   TRIG 247.0 (H) 09/22/2015   CHOLHDL 6 09/22/2015    Foot exam in 1/18 normal except absent pedal pulses on the right     Physical Examination:  BP 100/64 (BP Location: Right Arm, Cuff Size: Normal)   Pulse 60   Temp 98 F (36.7 C) (Oral)   Resp 16   Ht 5\' 6"  (1.676 m)   Wt 170 lb 3.2 oz (77.2 kg)   BMI 27.47 kg/m   ASSESSMENT:  Diabetes type 1:  See history of present illness for detailed discussion of her current blood sugar patterns, treatment regimen, meal planning and problems identified  Her A1c has stayed over 8% and now 8.3  Difficult to get a consistent glucose pattern and also with her variable mealtimes and waking up schedules not clear which readings are before or after eating including in the morning She is also taking some of her insulin doses irregularly and variable times with some missed doses at  bedtime Also needs assessment of her meals and whether she is getting balanced meals and amounts of carbohydrate at various meals in relationship to her NovoLog insulin   CKD: Followed by nephrologist  PLAN:    She will again be scheduled to see the diabetes educator for day-to-day  management and review of her mealtime insulin coverage  She will keep a record of her before and after meals blood sugar, food intake and insulin doses for review  She will take her NPH consistently at 11 PM  She will need to reconsider insulin pump and to talk to the nurse educator about this also  No change in insulin doses as yet  Reminded her to try and take her NovoLog before eating as much as possible  There are no Patient Instructions on file for this visit.    Counseling time on subjects discussed in assessment and plan sections is over 50% of today's 25 minute visit     Elayne Snare 11/11/2017, 11:28 AM

## 2017-11-12 ENCOUNTER — Ambulatory Visit (INDEPENDENT_AMBULATORY_CARE_PROVIDER_SITE_OTHER): Payer: Medicaid Other | Admitting: *Deleted

## 2017-11-12 DIAGNOSIS — Z5181 Encounter for therapeutic drug level monitoring: Secondary | ICD-10-CM

## 2017-11-12 DIAGNOSIS — I4891 Unspecified atrial fibrillation: Secondary | ICD-10-CM | POA: Diagnosis not present

## 2017-11-12 LAB — POCT INR: INR: 1.7 — AB (ref 2.0–3.0)

## 2017-11-12 NOTE — Patient Instructions (Signed)
Take coumadin 1 tablet tonight then resume 1/2 tablet daily Continue greens as usual Recheck in 3 weeks

## 2017-11-18 LAB — CUP PACEART REMOTE DEVICE CHECK
Implantable Lead Location: 753858
Implantable Lead Model: 180
Implantable Pulse Generator Implant Date: 20150225
MDC IDC LEAD IMPLANT DT: 20150225
MDC IDC LEAD IMPLANT DT: 20150225
MDC IDC LEAD LOCATION: 753860
MDC IDC LEAD SERIAL: 311545
MDC IDC SESS DTM: 20190924115036
Pulse Gen Serial Number: 114945

## 2017-11-24 ENCOUNTER — Other Ambulatory Visit: Payer: Self-pay | Admitting: Internal Medicine

## 2017-11-24 ENCOUNTER — Encounter: Payer: Medicaid Other | Attending: Endocrinology | Admitting: Nutrition

## 2017-11-24 DIAGNOSIS — Z713 Dietary counseling and surveillance: Secondary | ICD-10-CM | POA: Diagnosis present

## 2017-11-24 DIAGNOSIS — Z794 Long term (current) use of insulin: Secondary | ICD-10-CM | POA: Diagnosis not present

## 2017-11-24 DIAGNOSIS — E1165 Type 2 diabetes mellitus with hyperglycemia: Secondary | ICD-10-CM

## 2017-11-24 NOTE — Patient Instructions (Signed)
1.  Always take Novolog before the meals 2. Review list of carbs given, and have at least 2 servings at each meal. 3. Always take NPH at bedtime, but reduce the dose by 2u if morning blood sugars are low 2X in one week 4.  Drink juice after the meal,  if can not eat carbs in the meal--4-8 ounces.

## 2017-11-24 NOTE — Progress Notes (Signed)
Did not bring meter.  Says FBS are 120s, 50-60% of time, and lower about 20% of time.  ACS, 100-190 Weight down 8.5 pounds  Pt. Reports eating much less.  Meals appear to be balanced, except very low in carbs more times. Typical day: 9-9:30 j1 egg, grits, 1 piece of toast.  (may eat 3/4 of this meal).  Occasional juice or milk-2-3 ounces.    will take 12u of Novolog after the meal. 11AM: rare snack of apple, or 1-2 cheese crackers 1:30PM: soup with no crackers, or grilled cheese.  No sides and water to drink.   Will take 14u of Novolog after the meal  6PM: Supper: always some protein, rare starchy veg., but sometimes bread., water to drink.  Takes 14N   If she eats a small amount of supper, will not take the HS NPH (1/2 of the time). Otherwise, she is taking her NPH at 11PM.   Apolonio Schneiders 46u qAM  Plan. 1.  Always take Novolog ac meals 2. List of carbs given to help with having at least 2 servings at each meal. 3. Always take NPH at bedtime, but reduce the dose by 2u if morning blood sugars are low 2X in one week 4.  Drink juice if can not eat carbs in the meal--4-6 ounces.

## 2017-12-03 ENCOUNTER — Ambulatory Visit (INDEPENDENT_AMBULATORY_CARE_PROVIDER_SITE_OTHER): Payer: Medicaid Other | Admitting: *Deleted

## 2017-12-03 DIAGNOSIS — I4891 Unspecified atrial fibrillation: Secondary | ICD-10-CM

## 2017-12-03 DIAGNOSIS — Z5181 Encounter for therapeutic drug level monitoring: Secondary | ICD-10-CM | POA: Diagnosis not present

## 2017-12-03 LAB — POCT INR: INR: 1.9 — AB (ref 2.0–3.0)

## 2017-12-03 NOTE — Patient Instructions (Signed)
Increase coumadin to 1/2 tablet daily except 1 tablet on Wednesdays Continue greens as usual Recheck in 3 weeks

## 2017-12-12 LAB — H. PYLORI BREATH TEST: H. pylori Breath Test: DETECTED — AB

## 2017-12-15 ENCOUNTER — Other Ambulatory Visit: Payer: Self-pay | Admitting: *Deleted

## 2017-12-15 DIAGNOSIS — A048 Other specified bacterial intestinal infections: Secondary | ICD-10-CM

## 2017-12-15 NOTE — Progress Notes (Signed)
CC'D TO PCP °

## 2017-12-15 NOTE — Progress Notes (Signed)
Pt is aware and OK to refer to Infectious Disease.

## 2017-12-22 ENCOUNTER — Other Ambulatory Visit: Payer: Self-pay | Admitting: Endocrinology

## 2017-12-24 ENCOUNTER — Ambulatory Visit (INDEPENDENT_AMBULATORY_CARE_PROVIDER_SITE_OTHER): Payer: Medicaid Other | Admitting: *Deleted

## 2017-12-24 DIAGNOSIS — Z5181 Encounter for therapeutic drug level monitoring: Secondary | ICD-10-CM

## 2017-12-24 DIAGNOSIS — I4891 Unspecified atrial fibrillation: Secondary | ICD-10-CM | POA: Diagnosis not present

## 2017-12-24 LAB — POCT INR: INR: 2.5 (ref 2.0–3.0)

## 2017-12-24 NOTE — Patient Instructions (Signed)
Continue coumadin 1/2 tablet daily except 1 tablet on Wednesdays Continue greens as usual Recheck in 2 weeks May be starting treatment for H pylori after seeing Dr Megan Salon tomorrow.  Pt will call and let me know.

## 2017-12-25 ENCOUNTER — Encounter: Payer: Self-pay | Admitting: Internal Medicine

## 2017-12-25 ENCOUNTER — Ambulatory Visit (INDEPENDENT_AMBULATORY_CARE_PROVIDER_SITE_OTHER): Payer: Medicaid Other | Admitting: Internal Medicine

## 2017-12-25 DIAGNOSIS — K2970 Gastritis, unspecified, without bleeding: Secondary | ICD-10-CM

## 2017-12-25 DIAGNOSIS — B9681 Helicobacter pylori [H. pylori] as the cause of diseases classified elsewhere: Secondary | ICD-10-CM | POA: Diagnosis not present

## 2017-12-25 NOTE — Assessment & Plan Note (Signed)
Her H. pylori breath test was positive recently but I am not sure that she has a clear indication for treatment.  Her symptoms are rather vague but she is pretty adamant that she is not having any problems with acid reflux or other symptoms to suggest gastritis. She is not on long-term NSAIDs.  She has a history of penicillin allergy and is unsure of the severity or exact type of reaction.  I could treat her again with bismuth-metronidazole-tetracycline and a PPI but there could be a significant interaction with her warfarin putting her at risk for bleeding complications.  This could probably be managed with close follow-up by Toni Oh, RN who manages her warfarin dosing.  I do not feel that there is any urgency in making this decision.  She will discuss the situation with [name] and return to see me in about 6 weeks.

## 2017-12-25 NOTE — Progress Notes (Signed)
[name] for Infectious Disease  Reason for Consult: Helicobacter pylori infection Referring Provider: Dr. Barney [name]  Assessment: Her H. pylori breath test was positive recently but I am not sure that she has a clear indication for treatment.  Her symptoms are rather vague but she is pretty adamant that she is not having any problems with acid reflux or other symptoms to suggest gastritis. She is not on long-term NSAIDs.  She has a history of penicillin allergy and is unsure of the severity or exact type of reaction.  I could treat her again with bismuth-metronidazole-tetracycline and a PPI but there could be a significant interaction with her warfarin putting her at risk for bleeding complications.  This could probably be managed with close follow-up by Toni Oh, RN who manages her warfarin dosing.  I do not feel that there is any urgency in making this decision.  She will discuss the situation with [name] and return to see me in about 6 weeks.   Plan: 1. Reviewed possibility of H. pylori treatment with Toni Oh, RN 2. Follow-up here in 6 weeks  Patient Active Problem List   Diagnosis Date Noted  . Helicobacter pylori gastritis 10/31/2016    Priority: High  . Colon adenomas 06/15/2015  . Biventricular ICD (implantable cardioverter-defibrillator) in place 05/21/2013  . Other and unspecified hyperlipidemia 04/11/2013  . Chronic systolic heart failure ([place]) 04/07/2013  . Encounter for therapeutic drug monitoring 03/29/2013  . Chronic kidney disease, stage IV (severe) ([place]) 01/27/2013  . Dyspepsia 09/10/2012  . Gout 05/15/2012  . Chronic anticoagulation   . Congenital third degree heart block   . 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 Cardiac pacemaker in situ 04/24/2009  . 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 CARDIOMYOPATHY 01/20/2008  . ATRIAL FIBRILLATION, CHRONIC 01/20/2008    . Cardiorenal syndrome/chronic kidney disease 01/20/2008    Patient's Medications  New Prescriptions   No medications on file  Previous Medications   ACETAMINOPHEN (TYLENOL) 500 MG TABLET    Take 500 mg by mouth every 6 (six) hours as needed for moderate pain.   ALLOPURINOL (ZYLOPRIM) 100 MG TABLET    Take 150 mg by mouth every morning. Reported on 06/15/2015   BENAZEPRIL (LOTENSIN) 10 MG TABLET    Take 10 mg by mouth daily.   CALCITRIOL (ROCALTROL) 0.25 MCG CAPSULE    Take 0.25 mcg by mouth daily.   COREG CR 40 MG 24 HR CAPSULE    TAKE ONE CAPSULE BY MOUTH DAILY.   [place] 125 MCG TABLET    TAKE 1 TABLET BY MOUTH DAILY EXCEPT ON SATURDAY AND SUNDAY.   FUROSEMIDE (LASIX) 80 MG TABLET    TAKE (1) TABLET BY MOUTH TWICE DAILY.   INSULIN NPH, HUMAN,, ISOPHANE, (HUMULIN N KWIKPEN) 100 UNIT/ML KIWKPEN    Inject 34 units under the skin daily, according to sliding scale.   INSULIN PEN NEEDLE 31G X 5 MM MISC    1 each by Does not apply route daily. Use as instructed to check blood sugar 4 times daily.   LIRAGLUTIDE (VICTOZA) 18 MG/3ML SOPN    INJECT 1.2MG  SUBCUTANEOUSLY DAILY AS DIRECTED.   METOLAZONE (ZAROXOLYN) 2.5 MG TABLET    Take 2.5 mg by mouth daily.   NOVOLOG FLEXPEN 100 UNIT/ML FLEXPEN    INJECT SUBCUTANEOUSLY 12 UNITS BEFORE BREAKFAST; 14 UNITS AT LUNCH; AND 16 UNITS AT DINNER.   TRESIBA FLEXTOUCH  200 UNIT/ML SOPN    INJECT 56 UNITS INTO THE SKIN DAILY BEFORE BREAKFAST.   WARFARIN (JANTOVEN) 5 MG TABLET    TAKE 1/2 TO 1 TABLET BY MOUTH DAILY AS DIRECTED BY COUMADIN CLINIC.  Modified Medications   No medications on file  Discontinued Medications   No medications on file    HPI: [name] is a 57 y.o. female with a history of dysphagia following a stroke in 1999.  She is also had intermittent problems with acid reflux in the past.  She underwent upper endoscopy in 2013 revealing a distal esophageal ring. She underwent esophageal dilatation.  She was also noted to have mild gastritis  and underwent biopsies which showed Helicobacter pylori on [place]-Starry stain.  She has a history of remote rash with penicillin antibiotics.  She does not recall the reaction.  She was treated with bismuth-metronidazole-tetracycline (Pylera) and a proton pump inhibitor.  All she recalls about that treatment was that her tongue turned to arrange for a short period of time.  She does not recall if she felt any better.  I do not see that she had any test of cure following treatment.  She says that she thinks she has not needed to be on a proton pump inhibitor for about the last 4 years.  She has made adjustments in her diet over the past several years to help manage her diabetes and reflux.  She says that she has lost weight but feels better.  She denies any current problems with acid reflux or dysphagia.  She says that occasionally she will notice a "funny feeling" in her stomach when she drinks cold water.  She recently underwent a breath test and was found to be positive for H. pylori again.  She has been on warfarin since her stroke in 1999.  Review of Systems: Review of Systems  Gastrointestinal: Negative for abdominal pain, diarrhea, heartburn, nausea and vomiting.       As noted in history of present illness.      Past Medical History:  Diagnosis Date  . Automatic implantable cardioverter-defibrillator in situ    a. 03/2013: BSX Energen CRTD BiV ICC, ser# [id number]  . Chronic anticoagulation    a. coumadin.  . Chronic atrial fibrillation    embolic rind  . Chronic kidney disease    creatinine- 1.8 in 09/2006 and 2.0 in 05/2007; 2.05 in 2011, 2.29 in 2012  . Chronic systolic CHF (congestive heart failure) ([place])    a. 03/2013 Echo: Sev LV dysfxn with sev diff HK, restrictive phys, diast dysfxn, mild MR, sev dil LA/RA, mod PR, PASP 42mmHg.  . Congenital third degree heart block    a. Guidant VVI pacemaker implanted in 09/1997; b. gen change in 01/2004;  c. 03/2013 upgrade to [place], ser# [id number].  . Dizziness and giddiness 05/19/2012  . Dysphagia 08/14/2011   FEB 2013 EGD/DIL 16 MM; GERD; h/o gastroesophageal reflux disease; + H. Pylori gastritis   . GERD (gastroesophageal reflux disease)   . Helicobacter pylori gastritis 08/14/2011  . IDDM (insulin dependent diabetes mellitus) ([place])   . Kidney stones 1990's  . Nonischemic cardiomyopathy ([place])    a. 03/2013 Echo: Sev LV dysfxn with sev diff HK, restrictive phys, diast dysfxn, mild MR, sev dil LA/RA, mod PR, PASP 24mmHg;  b. 03/2013: BSX Energen CRTD BiV ICC, ser# [id number].  Marland Kitchen Potassium (K) excess   . Stroke [facility]) 1999   denies residual on 04/21/2013  Social History   Tobacco Use  . Smoking status: Never Smoker  . Smokeless tobacco: Never Used  . Tobacco comment: Never smoked  Substance Use Topics  . Alcohol use: No    Alcohol/week: 0.0 standard drinks  . Drug use: No    Family History  Adopted: Yes  Problem Relation Age of Onset  . Diabetes Daughter   . Colon cancer Neg Hx   . Colon polyps Neg Hx    Allergies  Allergen Reactions  . Wheat Swelling  . Latex Rash  . Penicillins Rash  . Sulfa Antibiotics Rash    OBJECTIVE: Vitals:   12/25/17 1014  BP: 108/70  Pulse: 73  Temp: 97.6 F (36.4 C)  Weight: 169 lb (76.7 kg)   Body mass index is 27.28 kg/m.   Physical Exam  Constitutional: She is oriented to person, place, and time.  She is very pleasant and in no distress.  She is accompanied by her husband.  Abdominal: Soft. She exhibits no distension and no mass. There is no tenderness.  Neurological: She is alert and oriented to person, place, and time.  Psychiatric: She has a normal mood and affect.    Microbiology: No results found for this or any previous visit (from the past 240 hour(s)).  Michel Bickers, MD [facility] [phone] pager   [phone] cell 12/25/2017, 12:00 PM

## 2017-12-26 ENCOUNTER — Other Ambulatory Visit: Payer: Self-pay | Admitting: Endocrinology

## 2018-01-03 ENCOUNTER — Other Ambulatory Visit: Payer: Self-pay | Admitting: Endocrinology

## 2018-01-05 ENCOUNTER — Other Ambulatory Visit: Payer: Self-pay | Admitting: Endocrinology

## 2018-01-06 ENCOUNTER — Other Ambulatory Visit: Payer: Self-pay

## 2018-01-06 MED ORDER — INSULIN ASPART 100 UNIT/ML FLEXPEN: PEN_INJECTOR | SUBCUTANEOUS | 2 refills | Status: DC

## 2018-01-08 ENCOUNTER — Ambulatory Visit
Admission: RE | Admit: 2018-01-08 | Discharge: 2018-01-08 | Disposition: A | Discharge status: Home / Self Care | Payer: Medicaid Other | Source: Ambulatory Visit | Attending: Obstetrics & Gynecology | Admitting: Obstetrics & Gynecology

## 2018-01-08 DIAGNOSIS — Z1231 Encounter for screening mammogram for malignant neoplasm of breast: Secondary | ICD-10-CM

## 2018-01-26 ENCOUNTER — Other Ambulatory Visit: Payer: Self-pay | Admitting: Endocrinology

## 2018-01-26 ENCOUNTER — Other Ambulatory Visit: Payer: Self-pay | Admitting: Cardiovascular Disease

## 2018-01-27 ENCOUNTER — Telehealth: Payer: Self-pay | Admitting: Endocrinology

## 2018-01-27 NOTE — Telephone Encounter (Signed)
Per [facility] " Caller is diabetic and she needs a refill om her medication. She needs prior authorization."

## 2018-01-27 NOTE — Telephone Encounter (Signed)
PA has been initiated and is not immediately approved. Pt will be notified when medication is approved.

## 2018-01-29 ENCOUNTER — Ambulatory Visit (INDEPENDENT_AMBULATORY_CARE_PROVIDER_SITE_OTHER): Payer: Medicaid Other

## 2018-01-29 DIAGNOSIS — I428 Other cardiomyopathies: Secondary | ICD-10-CM | POA: Diagnosis not present

## 2018-01-29 NOTE — Progress Notes (Signed)
Remote ICD transmission.   

## 2018-02-03 ENCOUNTER — Encounter: Payer: Self-pay | Admitting: Cardiology

## 2018-02-04 ENCOUNTER — Telehealth: Payer: Self-pay | Admitting: *Deleted

## 2018-02-04 NOTE — Telephone Encounter (Signed)
Called.  INR appt made for 02/09/18 at 2:30pm.  Pt verbalized understanding.

## 2018-02-04 NOTE — Telephone Encounter (Signed)
Please give pt a call to schedule her apt

## 2018-02-05 ENCOUNTER — Ambulatory Visit (INDEPENDENT_AMBULATORY_CARE_PROVIDER_SITE_OTHER): Payer: Medicaid Other | Admitting: Internal Medicine

## 2018-02-05 ENCOUNTER — Encounter: Payer: Self-pay | Admitting: Internal Medicine

## 2018-02-05 DIAGNOSIS — K2970 Gastritis, unspecified, without bleeding: Secondary | ICD-10-CM | POA: Diagnosis not present

## 2018-02-05 DIAGNOSIS — B9681 Helicobacter pylori [H. pylori] as the cause of diseases classified elsewhere: Secondary | ICD-10-CM | POA: Diagnosis not present

## 2018-02-05 NOTE — Assessment & Plan Note (Signed)
Her recent breath test was positive for Helicobacter pylori.  She does not have any symptoms of gastritis or reflux at this time and is not on chronic NSAIDs for any reason.  I talked to her about management options again including no treatment and watchful waiting to see if any GI symptoms develop again versus retreatment with bismuth-metronidazole- tetracycline and a proton pump inhibitor.  The latter option would have to be monitored closely because of drug drug interactions with her chronic Coumadin therapy.  She tells me that [name], who manages her Coumadin is willing to do that.  However, she is reluctant to start any therapy since she is feeling well.  She would like to avoid treatment for now.  I let her know that she can follow-up here as needed.

## 2018-02-05 NOTE — Progress Notes (Signed)
[facility]  Patient Active Problem List   Diagnosis Date Noted  . Helicobacter pylori gastritis 10/31/2016    Priority: High  . Colon adenomas 06/15/2015  . Biventricular ICD (implantable cardioverter-defibrillator) in place 05/21/2013  . Other and unspecified hyperlipidemia 04/11/2013  . Chronic systolic heart failure ([name]) 04/07/2013  . Encounter for therapeutic drug monitoring 03/29/2013  . Chronic kidney disease, stage IV (severe) ([name]) 01/27/2013  . Dyspepsia 09/10/2012  . Gout 05/15/2012  . Chronic anticoagulation   . Congenital third degree heart block   . Reversible ischemic neurological deficit ([name])   . Cardiac pacemaker in situ 04/24/2009  . 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 CARDIOMYOPATHY 01/20/2008  . ATRIAL FIBRILLATION, CHRONIC 01/20/2008  . Cardiorenal syndrome/chronic kidney disease 01/20/2008    Patient's Medications  New Prescriptions   No medications on file  Previous Medications   ACETAMINOPHEN (TYLENOL) 500 MG TABLET    Take 500 mg by mouth every 6 (six) hours as needed for moderate pain.   ALLOPURINOL (ZYLOPRIM) 100 MG TABLET    Take 150 mg by mouth every morning. Reported on 06/15/2015   BENAZEPRIL (LOTENSIN) 10 MG TABLET    Take 10 mg by mouth daily.   CALCITRIOL (ROCALTROL) 0.25 MCG CAPSULE    Take 0.25 mcg by mouth daily.   COREG CR 40 MG 24 HR CAPSULE    TAKE ONE CAPSULE BY MOUTH DAILY.   [place] 125 MCG TABLET    TAKE 1 TABLET BY MOUTH DAILY EXCEPT ON SATURDAY AND SUNDAY.   FUROSEMIDE (LASIX) 80 MG TABLET    TAKE (1) TABLET BY MOUTH TWICE DAILY.   INSULIN ASPART (NOVOLOG FLEXPEN) 100 UNIT/ML FLEXPEN    INJECT SUBCUTANEOUSLY 12 UNITS BEFORE BREAKFAST; 14 UNITS AT LUNCH; AND 16 UNITS AT DINNER.   INSULIN NPH, HUMAN,, ISOPHANE, (HUMULIN N KWIKPEN) 100 UNIT/ML KIWKPEN    Inject 34 units under the skin daily, according to sliding scale.   INSULIN PEN NEEDLE 31G X 5 MM MISC    1 each by Does not apply route daily. Use as instructed to check blood sugar 4 times daily.   LIRAGLUTIDE (VICTOZA) 18 MG/3ML SOPN    INJECT 1.2MG  SUBCUTANEOUSLY DAILY AS DIRECTED.   METOLAZONE (ZAROXOLYN) 2.5 MG TABLET    Take 2.5 mg by mouth daily.   NOVOLOG FLEXPEN 100 UNIT/ML FLEXPEN    INJECT SUBCUTANEOUSLY 12 UNITS BEFORE BREAKFAST; 14 UNITS AT LUNCH; AND 16 UNITS AT DINNER.   TRESIBA FLEXTOUCH 200 UNIT/ML SOPN    INJECT 56 UNITS INTO THE SKIN DAILY BEFORE BREAKFAST.   WARFARIN (COUMADIN) 5 MG TABLET    Take 1/2 tablet daily except 1 tablet on Wednesdays  Modified Medications   No medications on file  Discontinued Medications   No medications on file    Subjective: [name] is in for her routine follow-up visit.  She denies any new problems since her last visit.  Review of Systems: Review of Systems  Gastrointestinal: Negative for abdominal pain, constipation, diarrhea, heartburn, nausea and vomiting.    Past Medical History:  Diagnosis Date  . Automatic implantable cardioverter-defibrillator in situ    a. 03/2013: BSX Energen CRTD BiV ICC, ser# [id number]  . Chronic anticoagulation    a. coumadin.  . Chronic atrial fibrillation    embolic rind  . Chronic kidney disease    creatinine- 1.8 in 09/2006 and 2.0 in 05/2007; 2.05 in 2011,  2.29 in 2012  . Chronic systolic CHF (congestive heart failure) ([name])    a. 03/2013 Echo: Sev LV dysfxn with sev diff HK, restrictive phys, diast dysfxn, mild MR, sev dil LA/RA, mod PR, PASP 13mmHg.  . Congenital third degree heart block    a. Guidant VVI pacemaker implanted in 09/1997; b. gen change in 01/2004;  c. 03/2013 upgrade to [place], ser# [id number].  . Dizziness and giddiness 05/19/2012  . Dysphagia 08/14/2011   FEB 2013 EGD/DIL 16 MM; GERD; h/o gastroesophageal reflux disease; + H. Pylori gastritis   . GERD (gastroesophageal reflux disease)   . Helicobacter pylori gastritis 08/14/2011  .  IDDM (insulin dependent diabetes mellitus) ([name])   . Kidney stones 1990's  . Nonischemic cardiomyopathy ([place])    a. 03/2013 Echo: Sev LV dysfxn with sev diff HK, restrictive phys, diast dysfxn, mild MR, sev dil LA/RA, mod PR, PASP 42mmHg;  b. 03/2013: BSX Energen CRTD BiV ICC, ser# [id number].  Marland Kitchen Potassium (K) excess   . Stroke [name]) 1999   denies residual on 04/21/2013    Social History   Tobacco Use  . Smoking status: Never Smoker  . Smokeless tobacco: Never Used  . Tobacco comment: Never smoked  Substance Use Topics  . Alcohol use: No    Alcohol/week: 0.0 standard drinks  . Drug use: No    Family History  Adopted: Yes  Problem Relation Age of Onset  . Diabetes Daughter   . Colon cancer Neg Hx   . Colon polyps Neg Hx     Allergies  Allergen Reactions  . Wheat Swelling  . Latex Rash  . Penicillins Rash  . Sulfa Antibiotics Rash    Objective: Vitals:   02/05/18 1116  BP: 114/69  Pulse: 69  Temp: 98.2 F (36.8 C)  Weight: 170 lb (77.1 kg)   Body mass index is 27.44 kg/m.  Physical Exam Constitutional:      Comments: She is smiling and in good spirits.  She has lost about 16 pounds over the last 6 months but she attributes that to changes in her diet related to chronic renal insufficiency, diabetes and a past history of acid indigestion.  Abdominal:     General: Abdomen is flat. There is no distension.     Tenderness: There is no abdominal tenderness.  Psychiatric:        Mood and Affect: Mood normal.     Lab Results    Problem List Items Addressed This Visit      High   Helicobacter pylori gastritis    Her recent breath test was positive for Helicobacter pylori.  She does not have any symptoms of gastritis or reflux at this time and is not on chronic NSAIDs for any reason.  I talked to her about management options again including no treatment and watchful waiting to see if any GI symptoms develop again versus retreatment with bismuth-metronidazole-  tetracycline and a proton pump inhibitor.  The latter option would have to be monitored closely because of drug drug interactions with her chronic Coumadin therapy.  She tells me that [name], who manages her Coumadin is willing to do that.  However, she is reluctant to start any therapy since she is feeling well.  She would like to avoid treatment for now.  I let her know that she can follow-up here as needed.          Michel Bickers, MD [facility]  Group [id number] pager   [phone] cell 02/05/2018, 11:46 AM

## 2018-02-09 ENCOUNTER — Telehealth: Payer: Self-pay | Admitting: Endocrinology

## 2018-02-09 NOTE — Telephone Encounter (Signed)
error 

## 2018-02-10 ENCOUNTER — Other Ambulatory Visit: Payer: Self-pay

## 2018-02-10 ENCOUNTER — Telehealth: Payer: Self-pay | Admitting: Endocrinology

## 2018-02-10 MED ORDER — INSULIN ISOPHANE HUMAN 100 UNIT/ML KWIKPEN: PEN_INJECTOR | SUBCUTANEOUS | 3 refills | Status: DC

## 2018-02-10 NOTE — Telephone Encounter (Signed)
Per [facility]-"Caller states needing to speak with office regarding her meds. She needs it sent to pharm Humalin N"

## 2018-02-10 NOTE — Telephone Encounter (Signed)
This has been sent patient notified

## 2018-02-11 ENCOUNTER — Other Ambulatory Visit: Payer: Self-pay

## 2018-02-11 ENCOUNTER — Telehealth: Payer: Self-pay

## 2018-02-11 ENCOUNTER — Telehealth: Payer: Self-pay | Admitting: Endocrinology

## 2018-02-11 MED ORDER — INSULIN PEN NEEDLE 31G X 5 MM MISC: 1.0000 | Freq: Every day | 3 refills | Status: AC

## 2018-02-11 MED ORDER — ALCOHOL PREP PADS: 1.0000 | MEDICATED_PAD | Freq: Every day | 3 refills | Status: DC

## 2018-02-11 MED ORDER — INSULIN NPH (HUMAN) (ISOPHANE) 100 UNIT/ML ~~LOC~~ SUSP: 34.0000 [IU] | Freq: Every day | SUBCUTANEOUS | 3 refills | Status: DC

## 2018-02-11 NOTE — Telephone Encounter (Signed)
error 

## 2018-02-11 NOTE — Telephone Encounter (Signed)
Pt called and stated that her insurance company is now denying her Humulin N insulin. Preferred insulin is Novolin N. Pt does not want to have to draw up insulin.  Pt could not understand why her insulin is now being denied. The patient was informed that insurance can deny coverage for any medication they choose, and typically, if there is no medical reason for not using the preferred medication then the medication will just be switched to the preferred drug. Pt stated that it was okay, but when informed that the Novolin N is not available to be escribed in a pen form. The pt would have to draw it up with insulin needles. Pt denied wanting the medication at this point, because she did not want to have to draw up the insulin. She preferred the pen. Pt called back several minutes later and asked to send in the Novolin and she would speak with Dr. Dwyane Dee on her appointment day!

## 2018-02-12 ENCOUNTER — Ambulatory Visit (INDEPENDENT_AMBULATORY_CARE_PROVIDER_SITE_OTHER): Payer: Medicaid Other | Admitting: *Deleted

## 2018-02-12 DIAGNOSIS — Z5181 Encounter for therapeutic drug level monitoring: Secondary | ICD-10-CM

## 2018-02-12 DIAGNOSIS — I4891 Unspecified atrial fibrillation: Secondary | ICD-10-CM

## 2018-02-12 LAB — POCT INR: INR: 2.3 (ref 2.0–3.0)

## 2018-02-12 NOTE — Patient Instructions (Signed)
Continue coumadin 1/2 tablet daily except 1 tablet on Wednesdays Continue greens as usual Recheck in 4 weeks

## 2018-02-13 ENCOUNTER — Encounter: Payer: Self-pay | Admitting: Endocrinology

## 2018-02-13 ENCOUNTER — Ambulatory Visit (INDEPENDENT_AMBULATORY_CARE_PROVIDER_SITE_OTHER): Payer: Medicaid Other | Admitting: Endocrinology

## 2018-02-13 ENCOUNTER — Other Ambulatory Visit: Payer: Self-pay

## 2018-02-13 VITALS — BP 122/78 | HR 87 | Ht 66.0 in | Wt 172.4 lb

## 2018-02-13 DIAGNOSIS — N184 Chronic kidney disease, stage 4 (severe): Secondary | ICD-10-CM | POA: Diagnosis not present

## 2018-02-13 DIAGNOSIS — Z794 Long term (current) use of insulin: Secondary | ICD-10-CM

## 2018-02-13 DIAGNOSIS — E1165 Type 2 diabetes mellitus with hyperglycemia: Secondary | ICD-10-CM

## 2018-02-13 LAB — POCT GLYCOSYLATED HEMOGLOBIN (HGB A1C): Hemoglobin A1C: 7 % — AB (ref 4.0–5.6)

## 2018-02-13 MED ORDER — INSULIN NPH (HUMAN) (ISOPHANE) 100 UNIT/ML ~~LOC~~ SUSP: 34.0000 [IU] | Freq: Every day | SUBCUTANEOUS | 3 refills | Status: DC

## 2018-02-13 MED ORDER — INSULIN SYRINGE-NEEDLE U-100 30G 1 ML MISC: 1.0000 | Freq: Every day | 3 refills | Status: AC

## 2018-02-13 NOTE — Patient Instructions (Addendum)
Check blood sugars on waking up 4-5 days a week  Also check blood sugars about 2 hours after meals and do this after different meals by rotation  Recommended blood sugar levels on waking up are 90-130 and about 2 hours after meal is 130-160  Please bring your blood sugar monitor to each visit, thank you  Check 2-3x daily

## 2018-02-13 NOTE — Progress Notes (Signed)
Patient ID: [name], female   DOB: 12/11/1960, 57 y.o.   MRN: [id number]   Reason for Appointment : Follow up for Type 1 Diabetes  History of Present Illness           Diagnosis: Type 2 diabetes mellitus, date of diagnosis: 1990        Past history: She has been on insulin since 2007. Generally has had poor control in requiring large doses of insulin. Blood sugars also have been previously variable making her control difficult. She has had difficulty following instructions for mealtime insulin consistently in the past  Has been on a multiple injection regimen of Lantus twice a day, NovoLog a.c. and NPH at bedtime A1c has been as high as 11.3 previously   Recent history: INSULIN regimen is described as: [place] 46,  Humulin N 34 , NovoLog a.c.12- 14-16 before meals  Her A1c is much improved at 7%, previously consistently over 8%  Glucose patterns , current management and problems identified:  She was able to see the diabetes educator after her last visit to help her with her insulin adjustment  She apparently was not taking her NovoLog consistently before meals and also would adjust her bedtime NPH based on what she was eating arbitrarily  However even though her sugars are well controlled` from A1c she is checking her blood sugars rather infrequently  Most of her sugars are before breakfast which can be at variable times in the morning  POSTPRANDIAL readings are difficult to interpret but she has occasional 200 readings before or after dinnertime  She is trying to walk fairly regularly at the mall but not briskly  Has not had any recent weight change  Glucose monitoring:  done  1-2 times a day         Glucometer:  Accu-Chek Aviva        Blood Glucose readings from meter download   PRE-MEAL Fasting Lunch Dinner Bedtime Overall  Glucose range:  107-217  109  58, 229    Mean/median:  140    143   POST-MEAL PC Breakfast PC Lunch PC Dinner  Glucose range:     43-207  Mean/median:      Previous readings:   PRE-MEAL  8-10 AM Lunch Dinner Bedtime Overall  Glucose range:  67-286    71-267   Mean/median:  131  146   161 141 5/-54   POST-MEAL PC Breakfast PC Lunch  overnight  Glucose range:    77-212  Mean/median:    146    Physical activity: exercise: Walking somewhat more regularly  Self-care: The diet that the patient has been following is low fat,  Lunch : Sometimes salad otherwise sandwiches  Meals: 3 meals per day.  breakfast is 9 am at lunch 12 pm Supper at about 6-7 PM, mealtimes are variable           Dietician visit: Most recent:.Several years ago  Last CDE consult in 10/2017          Wt Readings from Last 3 Encounters:  02/13/18 172 lb 6.4 oz (78.2 kg)  02/05/18 170 lb (77.1 kg)  12/25/17 169 lb (76.7 kg)   A1c results  Lab Results  Component Value Date   HGBA1C 8.3 (A) 11/11/2017   HGBA1C 8.2 (A) 08/11/2017   HGBA1C 7.8 04/04/2017   Lab Results  Component Value Date   MICROALBUR 1.9 10/14/2014   LDLCALC 93 01/23/2015   CREATININE 2.60 (H) 03/03/2017  Lab Results  Component Value Date   MICROALBUR 1.9 10/14/2014     Allergies as of 02/13/2018      Reactions   Wheat Swelling   Latex Rash   Penicillins Rash   Sulfa Antibiotics Rash      Medication List       Accurate as of February 13, 2018 11:06 AM. Always use your most recent med list.        acetaminophen 500 MG tablet Commonly known as:  TYLENOL Take 500 mg by mouth every 6 (six) hours as needed for moderate pain.   Alcohol Prep Pads 1 each by Does not apply route daily.   allopurinol 100 MG tablet Commonly known as:  ZYLOPRIM Take 150 mg by mouth every morning. Reported on 06/15/2015   benazepril 10 MG tablet Commonly known as:  LOTENSIN Take 10 mg by mouth daily.   calcitRIOL 0.25 MCG capsule Commonly known as:  ROCALTROL Take 0.25 mcg by mouth daily.   COREG CR 40 MG 24 hr capsule Generic drug:  carvedilol TAKE ONE CAPSULE  BY MOUTH DAILY.   DIGOX 0.125 MG tablet Generic drug:  digoxin TAKE 1 TABLET BY MOUTH DAILY EXCEPT ON SATURDAY AND SUNDAY.   furosemide 80 MG tablet Commonly known as:  LASIX TAKE (1) TABLET BY MOUTH TWICE DAILY.   insulin aspart 100 UNIT/ML FlexPen Commonly known as:  NOVOLOG FLEXPEN INJECT SUBCUTANEOUSLY 12 UNITS BEFORE BREAKFAST; 14 UNITS AT LUNCH; AND 16 UNITS AT DINNER.   insulin NPH Human 100 UNIT/ML injection Commonly known as:  NOVOLIN N Inject 0.34 mLs (34 Units total) into the skin daily.   Insulin Pen Needle 29G X 10MM Misc 1 each by Does not apply route.   Insulin Pen Needle 31G X 5 MM Misc 1 each by Does not apply route daily. Use as instructed to check blood sugar 4 times daily.   liraglutide 18 MG/3ML Sopn Commonly known as:  VICTOZA INJECT 1.2MG  SUBCUTANEOUSLY DAILY AS DIRECTED.   TRESIBA FLEXTOUCH 200 UNIT/ML Sopn Generic drug:  Insulin Degludec INJECT 56 UNITS INTO THE SKIN DAILY BEFORE BREAKFAST.   warfarin 5 MG tablet Commonly known as:  COUMADIN Take as directed by the anticoagulation clinic. If you are unsure how to take this medication, talk to your nurse or doctor. Original instructions:  Take 1/2 tablet daily except 1 tablet on Wednesdays       Allergies:  Allergies  Allergen Reactions  . Wheat Swelling  . Latex Rash  . Penicillins Rash  . Sulfa Antibiotics Rash    Past Medical History:  Diagnosis Date  . Automatic implantable cardioverter-defibrillator in situ    a. 03/2013: BSX Energen CRTD BiV ICC, ser# [id number]  . Chronic anticoagulation    a. coumadin.  . Chronic atrial fibrillation    embolic rind  . Chronic kidney disease    creatinine- 1.8 in 09/2006 and 2.0 in 05/2007; 2.05 in 2011, 2.29 in 2012  . Chronic systolic CHF (congestive heart failure) ([place])    a. 03/2013 Echo: Sev LV dysfxn with sev diff HK, restrictive phys, diast dysfxn, mild MR, sev dil LA/RA, mod PR, PASP 55mmHg.  . Congenital third degree heart block    a.  Guidant VVI pacemaker implanted in 09/1997; b. gen change in 01/2004;  c. 03/2013 upgrade to [place], ser# [id number].  . Dizziness and giddiness 05/19/2012  . Dysphagia 08/14/2011   FEB 2013 EGD/DIL 16 MM; GERD; h/o gastroesophageal reflux disease; + H. Pylori gastritis   .  GERD (gastroesophageal reflux disease)   . Helicobacter pylori gastritis 08/14/2011  . IDDM (insulin dependent diabetes mellitus) ([place])   . Kidney stones 1990's  . Nonischemic cardiomyopathy ([place])    a. 03/2013 Echo: Sev LV dysfxn with sev diff HK, restrictive phys, diast dysfxn, mild MR, sev dil LA/RA, mod PR, PASP 58mmHg;  b. 03/2013: BSX Energen CRTD BiV ICC, ser# [id number].  Marland Kitchen Potassium (K) excess   . Stroke [facility]) 1999   denies residual on 04/21/2013    Past Surgical History:  Procedure Laterality Date  . BI-VENTRICULAR IMPLANTABLE CARDIOVERTER DEFIBRILLATOR  (CRT-D)  04/21/2013  . BI-VENTRICULAR IMPLANTABLE CARDIOVERTER DEFIBRILLATOR UPGRADE N/A 04/21/2013   Procedure: BI-VENTRICULAR IMPLANTABLE CARDIOVERTER DEFIBRILLATOR UPGRADE;  Surgeon: Evans Lance, MD;  Location: [facility] CATH LAB;  Service: Cardiovascular;  Laterality: N/A;  . COLONOSCOPY  04/23/2011   KZS:WFUXNATFTD COLON POLYP/ INTERNAL HEMORRHOIDS/ TORTUOUS COLON  . INSERT / REPLACE / REMOVE PACEMAKER  09/1997   Guidant VVI [employer]  . PACEMAKER GENERATOR CHANGE  01/2004  . UPPER GASTROINTESTINAL ENDOSCOPY  FEB 2013   RING/DIL TO 16 MM, H PYLORI GASTRITIS    Family History  Adopted: Yes  Problem Relation Age of Onset  . Diabetes Daughter   . Colon cancer Neg Hx   . Colon polyps Neg Hx     Social History:  reports that she has never smoked. She has never used smokeless tobacco. She reports that she does not drink alcohol or use drugs.    Review of Systems:   She has had chronic renal insufficiency, followed by nephrologist Last creatinine on file 2.4 in 6/19 .  No history of microalbuminuria   Lab Results  Component Value Date    CREATININE 2.60 (H) 03/03/2017   BUN 63 (H) 03/03/2017   NA 137 03/03/2017   K 4.8 03/03/2017   CL 103 03/03/2017   CO2 22 03/03/2017    She has had cardiomyopathy and History of atrial fibrillation followed by cardiologist  Lipids: Has dyslipidemia with low HDL, high triglycerides  followed by PCP Last LDL 113 done in October 2019  Lab Results  Component Value Date   CHOL 131 09/22/2015   HDL 23.30 (L) 09/22/2015   LDLCALC 93 01/23/2015   LDLDIRECT 65.0 09/22/2015   TRIG 247.0 (H) 09/22/2015   CHOLHDL 6 09/22/2015    Foot exam in 01/2018 normal except absent pedal pulses on the right No symptoms of neuropathy recently    Physical Examination:  BP 122/78 (BP Location: Left Arm, Patient Position: Sitting, Cuff Size: Normal)   Pulse 87   Ht 5\' 6"  (1.676 m)   Wt 172 lb 6.4 oz (78.2 kg)   SpO2 98%   BMI 27.83 kg/m   Diabetic Foot Exam - Simple   Simple Foot Form Diabetic Foot exam was performed with the following findings:  Yes   Visual Inspection No deformities, no ulcerations, no other skin breakdown bilaterally:  Yes Sensation Testing Intact to touch and monofilament testing bilaterally:  Yes Pulse Check See comments:  Yes Comments Absent right pedal pulses     ASSESSMENT:  Diabetes type 1:  See history of present illness for detailed discussion of her current blood sugar patterns, treatment regimen, meal planning and problems identified  Her A1c has improved significantly at 7%  As discussed above her blood sugar control is still variable but she is checking blood sugars primarily fasting and difficult to help her adjust her mealtime doses with lack of glucose monitoring later in the  day Also her mealtimes are quite variable in time and content is inconsistent She thinks she is doing better with compliance with her NovoLog and trying to adjust somewhat based on what she is eating Only occasionally has had a low blood sugar As before she has been  reluctant to consider insulin pump  Foot exam: No signs of neuropathy  CKD: Followed by nephrologist  She will call her ophthalmologist to have the eye exam report forwarded to [place]  PLAN:    She will try to check her sugars 2-3 times a day  Also if she is able to check it more often she can be eligible for the freestyle libre  If she is not able to get Humulin NPH from her insurance she may have to do the Novolin NPH in the same doses at bedtime with a syringe  No change in basal insulin doses as yet  Again reminded her not to change the dose of her NPH and let us know if her fasting readings are inconsistent or if she has overnight hypoglycemia  To bring blood sugar monitor on each visit  To cover mealtime doses based on portions and carbohydrate intake  Continue regular walking  There are no Patient Instructions on file for this visit.    Counseling time on subjects discussed in assessment and plan sections is over 50% of today's 25 minute visit     [name] 02/13/2018, 11:06 AM

## 2018-02-19 ENCOUNTER — Other Ambulatory Visit: Payer: Self-pay | Admitting: Endocrinology

## 2018-02-24 ENCOUNTER — Other Ambulatory Visit: Payer: Self-pay | Admitting: Endocrinology

## 2018-03-18 LAB — CUP PACEART REMOTE DEVICE CHECK
Battery Remaining Percentage: 98 %
Brady Statistic RA Percent Paced: 0 %
Brady Statistic RV Percent Paced: 97 %
Date Time Interrogation Session: 20191205065100
HighPow Impedance: 76 Ohm
Implantable Lead Implant Date: 20150225
Implantable Lead Implant Date: 20150225
Implantable Lead Location: 753858
Implantable Lead Location: 753860
Implantable Lead Model: 180
Implantable Lead Serial Number: 311545
Implantable Pulse Generator Implant Date: 20150225
Lead Channel Impedance Value: 461 Ohm
Lead Channel Impedance Value: 526 Ohm
Lead Channel Pacing Threshold Amplitude: 0.5 V
Lead Channel Pacing Threshold Amplitude: 0.6 V
Lead Channel Pacing Threshold Pulse Width: 0.5 ms
Lead Channel Pacing Threshold Pulse Width: 0.5 ms
Lead Channel Setting Pacing Amplitude: 2.4 V
Lead Channel Setting Pacing Amplitude: 2.4 V
Lead Channel Setting Pacing Pulse Width: 0.5 ms
Lead Channel Setting Pacing Pulse Width: 0.5 ms
Lead Channel Setting Sensing Sensitivity: 0.6 mV
Lead Channel Setting Sensing Sensitivity: 1 mV
MDC IDC MSMT BATTERY REMAINING LONGEVITY: 66 mo
MDC IDC PG SERIAL: 114945

## 2018-03-23 ENCOUNTER — Ambulatory Visit (INDEPENDENT_AMBULATORY_CARE_PROVIDER_SITE_OTHER): Payer: Medicaid Other | Admitting: Pharmacist

## 2018-03-23 DIAGNOSIS — I4891 Unspecified atrial fibrillation: Secondary | ICD-10-CM

## 2018-03-23 DIAGNOSIS — Z5181 Encounter for therapeutic drug level monitoring: Secondary | ICD-10-CM | POA: Diagnosis not present

## 2018-03-23 LAB — POCT INR: INR: 2 (ref 2.0–3.0)

## 2018-03-23 NOTE — Patient Instructions (Signed)
Description   Continue coumadin 1/2 tablet daily except 1 tablet on Wednesdays Continue greens as usual Recheck in 4 weeks

## 2018-04-11 ENCOUNTER — Other Ambulatory Visit: Payer: Self-pay | Admitting: Endocrinology

## 2018-04-13 ENCOUNTER — Telehealth: Payer: Self-pay

## 2018-04-13 NOTE — Telephone Encounter (Signed)
PA initiated and approved via [employer] call center for Victoza.  Approval [id number]

## 2018-04-20 ENCOUNTER — Other Ambulatory Visit: Payer: Self-pay | Admitting: Endocrinology

## 2018-04-22 ENCOUNTER — Ambulatory Visit (INDEPENDENT_AMBULATORY_CARE_PROVIDER_SITE_OTHER): Payer: Medicaid Other | Admitting: *Deleted

## 2018-04-22 DIAGNOSIS — I4891 Unspecified atrial fibrillation: Secondary | ICD-10-CM | POA: Diagnosis not present

## 2018-04-22 DIAGNOSIS — Z5181 Encounter for therapeutic drug level monitoring: Secondary | ICD-10-CM | POA: Diagnosis not present

## 2018-04-22 LAB — POCT INR: INR: 2.6 (ref 2.0–3.0)

## 2018-04-22 NOTE — Patient Instructions (Signed)
Continue coumadin 1/2 tablet daily except 1 tablet on Wednesdays Continue greens as usual Recheck in 4 weeks

## 2018-04-30 ENCOUNTER — Ambulatory Visit (INDEPENDENT_AMBULATORY_CARE_PROVIDER_SITE_OTHER): Payer: Medicaid Other | Admitting: *Deleted

## 2018-04-30 DIAGNOSIS — I428 Other cardiomyopathies: Secondary | ICD-10-CM

## 2018-04-30 LAB — CUP PACEART REMOTE DEVICE CHECK
Battery Remaining Longevity: 60 mo
Brady Statistic RA Percent Paced: 0 %
Brady Statistic RV Percent Paced: 98 %
Date Time Interrogation Session: 20200305073000
HighPow Impedance: 73 Ohm
Implantable Lead Implant Date: 20150225
Implantable Lead Implant Date: 20150225
Implantable Lead Location: 753858
Implantable Lead Location: 753860
Implantable Pulse Generator Implant Date: 20150225
Lead Channel Impedance Value: 435 Ohm
Lead Channel Impedance Value: 484 Ohm
Lead Channel Pacing Threshold Amplitude: 0.5 V
Lead Channel Pacing Threshold Amplitude: 0.6 V
Lead Channel Pacing Threshold Pulse Width: 0.5 ms
Lead Channel Setting Pacing Amplitude: 2.4 V
Lead Channel Setting Pacing Amplitude: 2.4 V
Lead Channel Setting Pacing Pulse Width: 0.5 ms
Lead Channel Setting Pacing Pulse Width: 0.5 ms
Lead Channel Setting Sensing Sensitivity: 0.6 mV
Lead Channel Setting Sensing Sensitivity: 1 mV
MDC IDC LEAD SERIAL: 311545
MDC IDC MSMT BATTERY REMAINING PERCENTAGE: 94 %
MDC IDC MSMT LEADCHNL LV PACING THRESHOLD PULSEWIDTH: 0.5 ms
Pulse Gen Serial Number: 114945

## 2018-05-01 ENCOUNTER — Other Ambulatory Visit: Payer: Self-pay | Admitting: Cardiovascular Disease

## 2018-05-01 ENCOUNTER — Ambulatory Visit (HOSPITAL_COMMUNITY)
Admission: RE | Admit: 2018-05-01 | Discharge: 2018-05-01 | Disposition: A | Discharge status: Home / Self Care | Payer: Medicaid Other | Source: Ambulatory Visit | Attending: Internal Medicine | Admitting: Internal Medicine

## 2018-05-01 ENCOUNTER — Other Ambulatory Visit (HOSPITAL_COMMUNITY): Payer: Self-pay | Admitting: Internal Medicine

## 2018-05-01 DIAGNOSIS — M25551 Pain in right hip: Secondary | ICD-10-CM | POA: Insufficient documentation

## 2018-05-05 ENCOUNTER — Ambulatory Visit: Payer: Medicaid Other | Admitting: Endocrinology

## 2018-05-08 ENCOUNTER — Telehealth: Payer: Self-pay | Admitting: Cardiovascular Disease

## 2018-05-08 ENCOUNTER — Telehealth: Payer: Self-pay

## 2018-05-08 ENCOUNTER — Encounter: Payer: Self-pay | Admitting: Cardiology

## 2018-05-08 NOTE — Telephone Encounter (Signed)
I just received cover my meds form from [employer]. I told patient I would start the process as form states she is rejected for refills

## 2018-05-08 NOTE — Telephone Encounter (Signed)
Spoke with Leonides Sake at [facility] ref # [id number], [place] [id number]   Told they need 48 hrs to review

## 2018-05-08 NOTE — Progress Notes (Signed)
Remote ICD transmission.   

## 2018-05-08 NOTE — Telephone Encounter (Signed)
Pt is needing a prior auth for her COREG CR 40 MG 24 hr capsule [[id number]]  Stated she only has 2 pills left.

## 2018-05-20 ENCOUNTER — Ambulatory Visit (INDEPENDENT_AMBULATORY_CARE_PROVIDER_SITE_OTHER): Payer: Medicaid Other | Admitting: *Deleted

## 2018-05-20 DIAGNOSIS — Z5181 Encounter for therapeutic drug level monitoring: Secondary | ICD-10-CM | POA: Diagnosis not present

## 2018-05-20 DIAGNOSIS — I4891 Unspecified atrial fibrillation: Secondary | ICD-10-CM

## 2018-05-20 LAB — POCT INR: INR: 1.7 — AB (ref 2.0–3.0)

## 2018-05-20 NOTE — Patient Instructions (Signed)
Take coumadin 1 1.2 tablets tonight, 1 tablet tomorrow night then resume 1/2 tablet daily except 1 tablet on Wednesdays Continue greens as usual Recheck in 3 weeks

## 2018-06-01 ENCOUNTER — Other Ambulatory Visit: Payer: Self-pay | Admitting: Cardiovascular Disease

## 2018-06-03 ENCOUNTER — Ambulatory Visit (INDEPENDENT_AMBULATORY_CARE_PROVIDER_SITE_OTHER): Payer: Medicaid Other | Admitting: Gastroenterology

## 2018-06-03 ENCOUNTER — Ambulatory Visit: Payer: Self-pay | Admitting: Gastroenterology

## 2018-06-03 ENCOUNTER — Other Ambulatory Visit: Payer: Self-pay

## 2018-06-03 DIAGNOSIS — D126 Benign neoplasm of colon, unspecified: Secondary | ICD-10-CM

## 2018-06-03 NOTE — Progress Notes (Signed)
Subjective:    Patient ID: [name], female    DOB: [birth year], 58 y.o.   MRN: [id number]  Toni Fire, MD  HPI RECENTLY HAD [facility]. SAW ID. DEFERRED Rx FOR NOW. FEELS OK. EATING BETTER. SLEEPING BETTER. BOWELS DOING GOOD. WEIGHT: 170 LBS. APPETITE: GOOD. ENERGY LEVEL GOOD. HAS A BIRTHDAY TOMORROW.  PT DENIES FEVER, CHILLS, HEMATOCHEZIA, HEMATEMESIS, nausea, vomiting, melena, diarrhea, CHEST PAIN, SHORTNESS OF BREATH, CHANGE IN BOWEL IN HABITS, constipation, abdominal pain, problems swallowing, OR heartburn or indigestion.  Past Medical History:  Diagnosis Date  . Automatic implantable cardioverter-defibrillator in situ    a. 03/2013: BSX Energen CRTD BiV ICC, ser# [id number]  . Chronic anticoagulation    a. coumadin.  . Chronic atrial fibrillation    embolic rind  . Chronic kidney disease    creatinine- 1.8 in 09/2006 and 2.0 in 05/2007; 2.05 in 2011, 2.29 in 2012  . Chronic systolic CHF (congestive heart failure) ([place])    a. 03/2013 Echo: Sev LV dysfxn with sev diff HK, restrictive phys, diast dysfxn, mild MR, sev dil LA/RA, mod PR, PASP 62mmHg.  . Congenital third degree heart block    a. Guidant VVI pacemaker implanted in 09/1997; b. gen change in 01/2004;  c. 03/2013 upgrade to [place], ser# [id number].  . Dizziness and giddiness 05/19/2012  . Dysphagia 08/14/2011   FEB 2013 EGD/DIL 16 MM; GERD; h/o gastroesophageal reflux disease; + H. Pylori gastritis   . GERD (gastroesophageal reflux disease)   . Helicobacter pylori gastritis 08/14/2011  . IDDM (insulin dependent diabetes mellitus) ([place])   . Kidney stones 1990's  . Nonischemic cardiomyopathy ([place])    a. 03/2013 Echo: Sev LV dysfxn with sev diff HK, restrictive phys, diast dysfxn, mild MR, sev dil LA/RA, mod PR, PASP 32mmHg;  b. 03/2013: BSX Energen CRTD BiV ICC, ser# [id number].  Marland Kitchen Potassium (K) excess   . Stroke [facility]) 1999   denies residual on 04/21/2013   Past Surgical History:  Procedure Laterality  Date  . BI-VENTRICULAR IMPLANTABLE CARDIOVERTER DEFIBRILLATOR  (CRT-D)  04/21/2013  . BI-VENTRICULAR IMPLANTABLE CARDIOVERTER DEFIBRILLATOR UPGRADE N/A 04/21/2013   Procedure: BI-VENTRICULAR IMPLANTABLE CARDIOVERTER DEFIBRILLATOR UPGRADE;  Surgeon: Evans Lance, MD;  Location: [facility] CATH LAB;  Service: Cardiovascular;  Laterality: N/A;  . COLONOSCOPY  04/23/2011   CHY:IFOYDXAJOI COLON POLYP/ INTERNAL HEMORRHOIDS/ TORTUOUS COLON  . INSERT / REPLACE / REMOVE PACEMAKER  09/1997   Guidant VVI [employer]  . PACEMAKER GENERATOR CHANGE  01/2004  . UPPER GASTROINTESTINAL ENDOSCOPY  FEB 2013   RING/DIL TO 16 MM, H PYLORI GASTRITIS   Allergies  Allergen Reactions  . Wheat Swelling  . Latex Rash  . Penicillins Rash  . Sulfa Antibiotics Rash    Current Outpatient Medications  Medication Sig    . acetaminophen (TYLENOL) 500 MG tablet Take 500 mg by mouth every 6 (six) hours as needed for moderate pain.    . Alcohol Swabs (ALCOHOL PREP) PADS 1 each by Does not apply route daily.    Marland Kitchen allopurinol (ZYLOPRIM) 100 MG tablet Take 150 mg by mouth every morning. Reported on 06/15/2015    . benazepril (LOTENSIN) 10 MG tablet Take 10 mg by mouth daily.    . Calcifediol ER (RAYALDEE) 30 MCG CPCR Take 1 capsule by mouth daily.    Marland Kitchen COREG CR 40 MG 24 hr capsule TAKE ONE CAPSULE BY MOUTH DAILY.    . digoxin (LANOXIN) 0.125 MG tablet TAKE 1 TABLET BY MOUTH DAILY  EXCEPT ON SATURDAY AND SUNDAY.    . furosemide (LASIX) 80 MG tablet TAKE (1) TABLET BY MOUTH TWICE DAILY.    Marland Kitchen insulin aspart (NOVOLOG FLEXPEN) 100 UNIT/ML FlexPen INJECT SUBCUTANEOUSLY 12 UNITS BEFORE BREAKFAST; 14 UNITS AT LUNCH; AND 16 UNITS AT DINNER.    Marland Kitchen insulin NPH Human (NOVOLIN N) 100 UNIT/ML injection Inject 0.34 mLs (34 Units total) into the skin daily.    . Insulin Pen Needle 29G X 10MM MISC 1 each by Does not apply route.    . Insulin Pen Needle 31G X 5 MM MISC 1 each by Does not apply route daily. Use as instructed to check blood sugar 4  times daily.    . Insulin Syringe-Needle U-100 30G 1 ML MISC 1 each by Does not apply route daily. Use needle to inject insulin 3 times daily.    Marland Kitchen liraglutide (VICTOZA) 18 MG/3ML SOPN INJECT 1.2MG  SUBCUTANEOUSLY DAILY AS DIRECTED. PT NEEDS APPT FOR FURTHER REFILLS.    Marland Kitchen TRESIBA FLEXTOUCH 200 UNIT/ML SOPN INJECT 56 UNITS INTO THE SKIN DAILY BEFORE BREAKFAST.    Marland Kitchen warfarin (COUMADIN) 5 MG tablet TAKE 1/2 DAILY EXCEPT ON WEDNESDAY TAKE 1 TABLET.     Review of Systems PER HPI OTHERWISE ALL SYSTEMS ARE NEGATIVE.    Objective:   Physical Exam  TELEPHONE VISIT DUE TO COVID 19, VISIT IS CONDUCTED VIRTUALLY AND WAS REQUESTED BY PATIENT.      Assessment & Plan:

## 2018-06-03 NOTE — Assessment & Plan Note (Signed)
NO WARNING SIGNS/SYMPTOMS. HIGH RISK FOR BLEEDING DUE TO NEED FOR COUMADIN. WILL LIKELY NEED BRIDGING LOVENOX.   WE WILL SCHEDULE AN OFFICE VISIT IN SEP OR OCT 2020. WE WILL SCHEDULE YOUR COLONOSCOPY AFTER YOUR NEXT OUTPATIENT VISIT. PLEASE CALL WITH QUESTIONS OR CONCERNS.

## 2018-06-03 NOTE — Patient Instructions (Addendum)
WE WILL SCHEDULE AN OFFICE VISIT IN SEP OR OCT 2020. WE WILL SCHEDULE YOUR COLONOSCOPY AFTER YOUR NEXT OUTPATIENT VISIT.   PLEASE CALL WITH QUESTIONS OR CONCERNS.

## 2018-06-04 NOTE — Progress Notes (Signed)
cc'ed to pcp °

## 2018-06-04 NOTE — Progress Notes (Signed)
ON RECALL  °

## 2018-06-09 ENCOUNTER — Telehealth: Payer: Self-pay | Admitting: *Deleted

## 2018-06-09 NOTE — Telephone Encounter (Signed)
° °  COVID-19 Pre-Screening Questions:   Do you currently have a fever?  (yes = cancel and refer to pcp for e-visit)  Have you recently travelled on a cruise, internationally, or to [place], [place], [place], [place], [place], or [place], [place] [employer]) ?  (yes = cancel, stay home, monitor symptoms, and contact pcp or initiate e-visit if symptoms develop)  Have you been in contact with someone that is currently pending confirmation of Covid19 testing or has been confirmed to have the Covid19 virus?   (yes = cancel, stay home, away from tested individual, monitor symptoms, and contact pcp or initiate e-visit if symptoms develop)  Are you currently experiencing fatigue or cough?  (yes = pt should be prepared to have a mask placed at the time of their visit).

## 2018-06-10 ENCOUNTER — Other Ambulatory Visit: Payer: Self-pay

## 2018-06-10 ENCOUNTER — Ambulatory Visit (INDEPENDENT_AMBULATORY_CARE_PROVIDER_SITE_OTHER): Payer: Medicaid Other | Admitting: *Deleted

## 2018-06-10 DIAGNOSIS — Z5181 Encounter for therapeutic drug level monitoring: Secondary | ICD-10-CM

## 2018-06-10 DIAGNOSIS — I4891 Unspecified atrial fibrillation: Secondary | ICD-10-CM | POA: Diagnosis not present

## 2018-06-10 LAB — POCT INR: INR: 3.9 — AB (ref 2.0–3.0)

## 2018-06-10 NOTE — Patient Instructions (Signed)
Hold coumadin tonight then resume 1/2 tablet daily except 1 tablet on Wednesdays Continue greens as usual Recheck in 4 weeks

## 2018-06-16 ENCOUNTER — Other Ambulatory Visit: Payer: Self-pay

## 2018-06-16 ENCOUNTER — Ambulatory Visit: Payer: Medicaid Other

## 2018-06-16 ENCOUNTER — Other Ambulatory Visit (INDEPENDENT_AMBULATORY_CARE_PROVIDER_SITE_OTHER): Payer: Medicaid Other

## 2018-06-16 DIAGNOSIS — E1165 Type 2 diabetes mellitus with hyperglycemia: Secondary | ICD-10-CM | POA: Diagnosis not present

## 2018-06-16 DIAGNOSIS — Z794 Long term (current) use of insulin: Secondary | ICD-10-CM

## 2018-06-16 LAB — POCT GLYCOSYLATED HEMOGLOBIN (HGB A1C): Hemoglobin A1C: 7.4 % — AB (ref 4.0–5.6)

## 2018-06-17 ENCOUNTER — Other Ambulatory Visit: Payer: Self-pay

## 2018-06-18 ENCOUNTER — Telehealth: Payer: Self-pay | Admitting: Cardiovascular Disease

## 2018-06-18 ENCOUNTER — Ambulatory Visit (INDEPENDENT_AMBULATORY_CARE_PROVIDER_SITE_OTHER): Payer: Medicaid Other | Admitting: Endocrinology

## 2018-06-18 ENCOUNTER — Other Ambulatory Visit: Payer: Self-pay

## 2018-06-18 ENCOUNTER — Encounter: Payer: Self-pay | Admitting: Endocrinology

## 2018-06-18 DIAGNOSIS — E1165 Type 2 diabetes mellitus with hyperglycemia: Secondary | ICD-10-CM | POA: Diagnosis not present

## 2018-06-18 DIAGNOSIS — Z794 Long term (current) use of insulin: Secondary | ICD-10-CM | POA: Diagnosis not present

## 2018-06-18 NOTE — Telephone Encounter (Signed)
Virtual Visit Pre-Appointment Phone Call  "(Name), I am calling you today to discuss your upcoming appointment. We are currently trying to limit exposure to the virus that causes COVID-19 by seeing patients at home rather than in the office."  1. "What is the BEST phone number to call the day of the visit?" - include this in appointment notes  2. Do you have or have access to (through a family member/friend) a smartphone with video capability that we can use for your visit?" a. If yes - list this number in appt notes as cell (if different from BEST phone #) and list the appointment type as a VIDEO visit in appointment notes b. If no - list the appointment type as a PHONE visit in appointment notes  3. Confirm consent - "In the setting of the current Covid19 crisis, you are scheduled for a (phone or video) visit with your provider on (date) at (time).  Just as we do with many in-office visits, in order for you to participate in this visit, we must obtain consent.  If you'd like, I can send this to your mychart (if signed up) or email for you to review.  Otherwise, I can obtain your verbal consent now.  All virtual visits are billed to your insurance company just like a normal visit would be.  By agreeing to a virtual visit, we'd like you to understand that the technology does not allow for your provider to perform an examination, and thus may limit your provider's ability to fully assess your condition. If your provider identifies any concerns that need to be evaluated in person, we will make arrangements to do so.  Finally, though the technology is pretty good, we cannot assure that it will always work on either your or our end, and in the setting of a video visit, we may have to convert it to a phone-only visit.  In either situation, we cannot ensure that we have a secure connection.  Are you willing to proceed?" STAFF: Did the patient verbally acknowledge consent to telehealth visit? Document  YES/NO here: Yes  4. Advise patient to be prepared - "Two hours prior to your appointment, go ahead and check your blood pressure, pulse, oxygen saturation, and your weight (if you have the equipment to check those) and write them all down. When your visit starts, your provider will ask you for this information. If you have an Apple Watch or Kardia device, please plan to have heart rate information ready on the day of your appointment. Please have a pen and paper handy nearby the day of the visit as well."  5. Give patient instructions for MyChart download to smartphone OR [email or web address] as below if video visit (depending on what platform provider is using)  6. Inform patient they will receive a phone call 15 minutes prior to their appointment time (may be from unknown caller ID) so they should be prepared to answer    [place] M Van[place]st has been deemed a candidate for a follow-up tele-health visit to limit community exposure during the Covid-19 pandemic. I spoke with the patient via phone to ensure availability of phone/video source, confirm preferred email & phone number, and discuss instructions and expectations.  I reminded FIDELIA CATHERS to be prepared with any vital sign and/or heart rhythm information that could potentially be obtained via home monitoring, at the time of her visit. I reminded AVION PATELLA to expect a phone call prior to  her visit.  Terry L Goins 06/18/2018 1:11 PM

## 2018-06-18 NOTE — Progress Notes (Signed)
Patient ID: [name], female   DOB: [birth year], 59 y.o.   MRN: [id number]   Reason for Appointment : Follow up for Type 1 Diabetes  History of Present Illness    Today's office visit was provided via telemedicine using video technique Explained to the patient and the the limitations of evaluation and management by telemedicine and the availability of in person appointments.  The patient understood the limitations and agreed to proceed. Patient also understood that the telehealth visit is billable. . Location of the patient: Home . Location of the provider: Office Only the patient and myself were participating in the encounter           Diagnosis: Type 2 diabetes mellitus, date of diagnosis: 1990        Past history: She has been on insulin since 2007. Generally has had poor control in requiring large doses of insulin. Blood sugars also have been previously variable making her control difficult. She has had difficulty following instructions for mealtime insulin consistently in the past  Has been on a multiple injection regimen of Lantus twice a day, NovoLog a.c. and NPH at bedtime A1c has been as high as 11.3 previously   Recent history: INSULIN regimen is described as: [place] 46,  Humulin N 30, NovoLog a.c.12- 14-16 before meals  Her A1c is somewhat higher at 7.4 compared to 7% April 2020:  Glucose patterns , current management and problems identified:  She was seen remotely and did not have her blood sugar download  Only blood sugar for the last week or so have been reviewed  She said that she tends to get up during the night with blood sugars occasionally in the 40s but did not call to report this  Fasting blood sugars are recently not low with the lowest reading 69 yesterday  Also may sometimes have snacks before bedtime because of tendency to low sugars  Although she thinks she is having a variable appetite and generally not eating she does not think  she has lost weight  Her blood sugars are occasionally over 200 after supper and she cannot explain why or what kind of foods make her sugar go up.  Blood sugars are not low before dinner but sometimes she will drink juice  She thinks she is doing a little better with her NovoLog although previously had been reporting missing some doses  She is trying to walk but only a couple of times a week for fear of going out  Has not changed her Tresiba dose since her last visit  Glucose monitoring:  done  1-2 times a day         Glucometer:  Accu-Chek Aviva        Blood Glucose readings from meter review done by patient   PRE-MEAL Fasting Lunch Dinner  overnight Overall  Glucose range:  69-162  69-193 ?  80-177   Mean/median:     ?   POST-MEAL PC Breakfast PC Lunch PC Dinner  Glucose range:    65-248  Mean/median:      PREVIOUS readings:  PRE-MEAL Fasting Lunch Dinner Bedtime Overall  Glucose range:  107-217  109  58, 229    Mean/median:  140    143   POST-MEAL PC Breakfast PC Lunch PC Dinner  Glucose range:    43-207  Mean/median:         Physical activity: exercise: Walking somewhat more regularly  Self-care: The diet that the patient has been  following is low fat,  Lunch : Sometimes salad otherwise sandwiches  Meals: 3 meals per day.  breakfast is 9 am at lunch 12 pm Supper at about 6-7 PM, mealtimes are variable           Dietician visit: Most recent:.Several years ago  Last CDE consult in 10/2017          Wt Readings from Last 3 Encounters:  02/13/18 172 lb 6.4 oz (78.2 kg)  02/05/18 170 lb (77.1 kg)  12/25/17 169 lb (76.7 kg)   A1c results  Lab Results  Component Value Date   HGBA1C 7.4 (A) 06/16/2018   HGBA1C 7.0 (A) 02/13/2018   HGBA1C 8.3 (A) 11/11/2017   Lab Results  Component Value Date   MICROALBUR 1.9 10/14/2014   LDLCALC 93 01/23/2015   CREATININE 2.60 (H) 03/03/2017     Lab Results  Component Value Date   MICROALBUR 1.9 10/14/2014      Allergies as of 06/18/2018      Reactions   Wheat Swelling   Latex Rash   Penicillins Rash   Sulfa Antibiotics Rash      Medication List       Accurate as of June 18, 2018  8:59 AM. Always use your most recent med list.        acetaminophen 500 MG tablet Commonly known as:  TYLENOL Take 500 mg by mouth every 6 (six) hours as needed for moderate pain.   Alcohol Prep Pads 1 each by Does not apply route daily.   allopurinol 100 MG tablet Commonly known as:  ZYLOPRIM Take 150 mg by mouth every morning. Reported on 06/15/2015   benazepril 10 MG tablet Commonly known as:  LOTENSIN Take 10 mg by mouth daily.   Coreg CR 40 MG 24 hr capsule Generic drug:  carvedilol TAKE ONE CAPSULE BY MOUTH DAILY.   digoxin 0.125 MG tablet Commonly known as:  LANOXIN TAKE 1 TABLET BY MOUTH DAILY EXCEPT ON SATURDAY AND SUNDAY.   furosemide 80 MG tablet Commonly known as:  LASIX TAKE (1) TABLET BY MOUTH TWICE DAILY.   insulin aspart 100 UNIT/ML FlexPen Commonly known as:  NovoLOG FlexPen INJECT SUBCUTANEOUSLY 12 UNITS BEFORE BREAKFAST; 14 UNITS AT LUNCH; AND 16 UNITS AT DINNER.   insulin NPH Human 100 UNIT/ML injection Commonly known as:  NovoLIN N Inject 0.34 mLs (34 Units total) into the skin daily.   Insulin Pen Needle 29G X 10MM Misc 1 each by Does not apply route.   Insulin Pen Needle 31G X 5 MM Misc 1 each by Does not apply route daily. Use as instructed to check blood sugar 4 times daily.   Insulin Syringe-Needle U-100 30G 1 ML Misc 1 each by Does not apply route daily. Use needle to inject insulin 3 times daily.   liraglutide 18 MG/3ML Sopn Commonly known as:  Victoza INJECT 1.2MG  SUBCUTANEOUSLY DAILY AS DIRECTED. PT NEEDS APPT FOR FURTHER REFILLS.   Rayaldee 30 MCG Cpcr Generic drug:  Calcifediol ER Take 1 capsule by mouth daily.   Tyler Aas FlexTouch 200 UNIT/ML Sopn Generic drug:  Insulin Degludec INJECT 56 UNITS INTO THE SKIN DAILY BEFORE BREAKFAST.   warfarin 5  MG tablet Commonly known as:  COUMADIN Take as directed by the anticoagulation clinic. If you are unsure how to take this medication, talk to your nurse or doctor. Original instructions:  TAKE 1/2 DAILY EXCEPT ON WEDNESDAY TAKE 1 TABLET.       Allergies:  Allergies  Allergen Reactions  .  Wheat Swelling  . Latex Rash  . Penicillins Rash  . Sulfa Antibiotics Rash    Past Medical History:  Diagnosis Date  . Automatic implantable cardioverter-defibrillator in situ    a. 03/2013: BSX Energen CRTD BiV ICC, ser# [id number]  . Chronic anticoagulation    a. coumadin.  . Chronic atrial fibrillation    embolic rind  . Chronic kidney disease    creatinine- 1.8 in 09/2006 and 2.0 in 05/2007; 2.05 in 2011, 2.29 in 2012  . Chronic systolic CHF (congestive heart failure) ([place])    a. 03/2013 Echo: Sev LV dysfxn with sev diff HK, restrictive phys, diast dysfxn, mild MR, sev dil LA/RA, mod PR, PASP 26mmHg.  . Congenital third degree heart block    a. Guidant VVI pacemaker implanted in 09/1997; b. gen change in 01/2004;  c. 03/2013 upgrade to [place], ser# [id number].  . Dizziness and giddiness 05/19/2012  . Dysphagia 08/14/2011   FEB 2013 EGD/DIL 16 MM; GERD; h/o gastroesophageal reflux disease; + H. Pylori gastritis   . GERD (gastroesophageal reflux disease)   . Helicobacter pylori gastritis 08/14/2011  . IDDM (insulin dependent diabetes mellitus) ([place])   . Kidney stones 1990's  . Nonischemic cardiomyopathy ([place])    a. 03/2013 Echo: Sev LV dysfxn with sev diff HK, restrictive phys, diast dysfxn, mild MR, sev dil LA/RA, mod PR, PASP 20mmHg;  b. 03/2013: BSX Energen CRTD BiV ICC, ser# [id number].  Marland Kitchen Potassium (K) excess   . Stroke [facility]) 1999   denies residual on 04/21/2013    Past Surgical History:  Procedure Laterality Date  . BI-VENTRICULAR IMPLANTABLE CARDIOVERTER DEFIBRILLATOR  (CRT-D)  04/21/2013  . BI-VENTRICULAR IMPLANTABLE CARDIOVERTER DEFIBRILLATOR UPGRADE N/A 04/21/2013   Procedure:  BI-VENTRICULAR IMPLANTABLE CARDIOVERTER DEFIBRILLATOR UPGRADE;  Surgeon: Evans Lance, MD;  Location: [facility] CATH LAB;  Service: Cardiovascular;  Laterality: N/A;  . COLONOSCOPY  04/23/2011   VVO:HYWVPXTGGY COLON POLYP/ INTERNAL HEMORRHOIDS/ TORTUOUS COLON  . INSERT / REPLACE / REMOVE PACEMAKER  09/1997   Guidant VVI [employer]  . PACEMAKER GENERATOR CHANGE  01/2004  . UPPER GASTROINTESTINAL ENDOSCOPY  FEB 2013   RING/DIL TO 16 MM, H PYLORI GASTRITIS    Family History  Adopted: Yes  Problem Relation Age of Onset  . Diabetes Daughter   . Colon cancer Neg Hx   . Colon polyps Neg Hx     Social History:  reports that she has never smoked. She has never used smokeless tobacco. She reports that she does not drink alcohol or use drugs.    Review of Systems:   She has had chronic renal insufficiency, followed by nephrologist Last creatinine on file 2.4 in 6/19 No history of microalbuminuria   Lab Results  Component Value Date   CREATININE 2.60 (H) 03/03/2017   BUN 63 (H) 03/03/2017   NA 137 03/03/2017   K 4.8 03/03/2017   CL 103 03/03/2017   CO2 22 03/03/2017    She has had cardiomyopathy and History of atrial fibrillation followed by cardiologist  Lipids: Has dyslipidemia with low HDL, high triglycerides  followed by PCP Last LDL 113 done in October 2019  Lab Results  Component Value Date   CHOL 131 09/22/2015   HDL 23.30 (L) 09/22/2015   LDLCALC 93 01/23/2015   LDLDIRECT 65.0 09/22/2015   TRIG 247.0 (H) 09/22/2015   CHOLHDL 6 09/22/2015    Foot exam in 01/2018 normal except absent pedal pulses on the right No symptoms of neuropathy recently  Physical Examination:  There were no vitals taken for this visit.   ASSESSMENT:  Diabetes type 1:  See history of present illness for detailed discussion of her current blood sugar patterns, treatment regimen, meal planning and problems identified  Her A1c has gone up slightly at 7.4, previously 7%  She has  not checked her sugars enough especially after meals She is tending to have some overnight hypoglycemia including some low readings during the night With this she may be overtreating and causing rebound but also still has periodic postprandial hyperglycemia at night after dinner Not sure if she is taking her insulin consistently to cover her meals She does not understand the need to adjust her dose based on total amount of carbohydrates and occasionally this will cause low blood sugars such as after breakfast recently She has done a little walking but not clear if she is losing weight Discussed need to try and get her A1c below 7%   CKD: Followed by nephrologist    PLAN:    Encouraged her to check more readings after meals to help adjust her NovoLog  She will cut down her NovoLog up to 50% if eating smaller meals or less heart rate  Reduce NPH to 25 units instead of 30  No change in [place]  Increase walking to at least 5 days a week  There are no Patient Instructions on file for this visit.    Counseling time on subjects discussed in assessment and plan sections is over 50% of today's 25 minute visit     Elayne Snare 06/18/2018, 8:59 AM

## 2018-06-23 ENCOUNTER — Telehealth (INDEPENDENT_AMBULATORY_CARE_PROVIDER_SITE_OTHER): Payer: Medicaid Other | Admitting: Cardiovascular Disease

## 2018-06-23 ENCOUNTER — Encounter: Payer: Self-pay | Admitting: Cardiovascular Disease

## 2018-06-23 VITALS — Ht 66.0 in | Wt 170.0 lb

## 2018-06-23 DIAGNOSIS — Q246 Congenital heart block: Secondary | ICD-10-CM

## 2018-06-23 DIAGNOSIS — I4891 Unspecified atrial fibrillation: Secondary | ICD-10-CM

## 2018-06-23 DIAGNOSIS — Z95810 Presence of automatic (implantable) cardiac defibrillator: Secondary | ICD-10-CM

## 2018-06-23 DIAGNOSIS — I5022 Chronic systolic (congestive) heart failure: Secondary | ICD-10-CM

## 2018-06-23 DIAGNOSIS — I10 Essential (primary) hypertension: Secondary | ICD-10-CM

## 2018-06-23 DIAGNOSIS — I428 Other cardiomyopathies: Secondary | ICD-10-CM

## 2018-06-23 NOTE — Progress Notes (Signed)
Virtual Visit via Video Note   This visit type was conducted due to national recommendations for restrictions regarding the COVID-19 Pandemic (e.g. social distancing) in an effort to limit this patient's exposure and mitigate transmission in our community.  Due to her co-morbid illnesses, this patient is at least at moderate risk for complications without adequate follow up.  This format is felt to be most appropriate for this patient at this time.  All issues noted in this document were discussed and addressed.  A limited physical exam was performed with this format.  Please refer to the patient's chart for her consent to telehealth for [facility].   Evaluation Performed:  Follow-up visit  Date:  06/23/2018   ID:  [name], DOB [birth year], MRN [id number]  Patient Location: Home Provider Location: Home  PCP:  [name], MD  Cardiologist:  Kate Sable, MD  Electrophysiologist:  [name], MD   Chief Complaint:  CHF  History of Present Illness:    [name] is a 58 y.o. female with a history of congenital third degree heart block, atrial fibrillation, nonischemic cardiomyopathy s/p BiV ICD (04/21/13) with chronic systolic heart failure, and insulin-dependent diabetes mellitus.  Most recent echocardiogram performed on 04/10/17 showed severely reduced left ventricular systolic function, LVEF 74-25%, diffuse hypokinesis, restrictive diastolic physiology with high ventricular filling pressures, moderate tricuspid regurgitation, and moderately increased pulmonary pressures (48 mmHg).  The patient denies any symptoms of chest pain, palpitations, shortness of breath, lightheadedness, dizziness, leg swelling, orthopnea, PND, and syncope.  The patient does not have symptoms concerning for COVID-19 infection (fever, chills, cough, or new shortness of breath).    Past Medical History:  Diagnosis Date  . Automatic implantable cardioverter-defibrillator in situ    a.  03/2013: BSX Energen CRTD BiV ICC, ser# [id number]  . Chronic anticoagulation    a. coumadin.  . Chronic atrial fibrillation    embolic rind  . Chronic kidney disease    creatinine- 1.8 in 09/2006 and 2.0 in 05/2007; 2.05 in 2011, 2.29 in 2012  . Chronic systolic CHF (congestive heart failure) ([place])    a. 03/2013 Echo: Sev LV dysfxn with sev diff HK, restrictive phys, diast dysfxn, mild MR, sev dil LA/RA, mod PR, PASP 42mmHg.  . Congenital third degree heart block    a. Guidant VVI pacemaker implanted in 09/1997; b. gen change in 01/2004;  c. 03/2013 upgrade to [place], ser# [id number].  . Dizziness and giddiness 05/19/2012  . Dysphagia 08/14/2011   FEB 2013 EGD/DIL 16 MM; GERD; h/o gastroesophageal reflux disease; + H. Pylori gastritis   . GERD (gastroesophageal reflux disease)   . Helicobacter pylori gastritis 08/14/2011  . IDDM (insulin dependent diabetes mellitus) ([place])   . Kidney stones 1990's  . Nonischemic cardiomyopathy ([place])    a. 03/2013 Echo: Sev LV dysfxn with sev diff HK, restrictive phys, diast dysfxn, mild MR, sev dil LA/RA, mod PR, PASP 85mmHg;  b. 03/2013: BSX Energen CRTD BiV ICC, ser# [id number].  Marland Kitchen Potassium (K) excess   . Stroke [facility]) 1999   denies residual on 04/21/2013   Past Surgical History:  Procedure Laterality Date  . BI-VENTRICULAR IMPLANTABLE CARDIOVERTER DEFIBRILLATOR  (CRT-D)  04/21/2013  . BI-VENTRICULAR IMPLANTABLE CARDIOVERTER DEFIBRILLATOR UPGRADE N/A 04/21/2013   Procedure: BI-VENTRICULAR IMPLANTABLE CARDIOVERTER DEFIBRILLATOR UPGRADE;  Surgeon: Evans Lance, MD;  Location: [facility] CATH LAB;  Service: Cardiovascular;  Laterality: N/A;  . COLONOSCOPY  04/23/2011   ZDG:LOVFIEPPIR COLON POLYP/ INTERNAL HEMORRHOIDS/ TORTUOUS COLON  .  INSERT / REPLACE / REMOVE PACEMAKER  09/1997   Guidant VVI [employer]  . PACEMAKER GENERATOR CHANGE  01/2004  . UPPER GASTROINTESTINAL ENDOSCOPY  FEB 2013   RING/DIL TO 16 MM, H PYLORI GASTRITIS     Current Meds   Medication Sig  . acetaminophen (TYLENOL) 500 MG tablet Take 500 mg by mouth every 6 (six) hours as needed for moderate pain.  Marland Kitchen allopurinol (ZYLOPRIM) 100 MG tablet Take 150 mg by mouth every morning. Reported on 06/15/2015  . benazepril (LOTENSIN) 10 MG tablet Take 10 mg by mouth daily.  . Calcifediol ER (RAYALDEE) 30 MCG CPCR Take 1 capsule by mouth daily.  Marland Kitchen COREG CR 40 MG 24 hr capsule TAKE ONE CAPSULE BY MOUTH DAILY.  . digoxin (LANOXIN) 0.125 MG tablet TAKE 1 TABLET BY MOUTH DAILY EXCEPT ON SATURDAY AND SUNDAY.  . furosemide (LASIX) 80 MG tablet TAKE (1) TABLET BY MOUTH TWICE DAILY.  Marland Kitchen insulin aspart (NOVOLOG FLEXPEN) 100 UNIT/ML FlexPen INJECT SUBCUTANEOUSLY 12 UNITS BEFORE BREAKFAST; 14 UNITS AT LUNCH; AND 16 UNITS AT DINNER.  Marland Kitchen insulin NPH Human (NOVOLIN N) 100 UNIT/ML injection Inject 0.34 mLs (34 Units total) into the skin daily. (Patient taking differently: Inject 30 Units into the skin daily. )  . liraglutide (VICTOZA) 18 MG/3ML SOPN INJECT 1.2MG  SUBCUTANEOUSLY DAILY AS DIRECTED. PT NEEDS APPT FOR FURTHER REFILLS.  Marland Kitchen TRESIBA FLEXTOUCH 200 UNIT/ML SOPN INJECT 56 UNITS INTO THE SKIN DAILY BEFORE BREAKFAST.  Marland Kitchen warfarin (COUMADIN) 5 MG tablet TAKE 1/2 DAILY EXCEPT ON WEDNESDAY TAKE 1 TABLET.     Allergies:   Wheat; Latex; Penicillins; and Sulfa antibiotics   Social History   Tobacco Use  . Smoking status: Never Smoker  . Smokeless tobacco: Never Used  . Tobacco comment: Never smoked  Substance Use Topics  . Alcohol use: No    Alcohol/week: 0.0 standard drinks  . Drug use: No     Family Hx: The patient's family history includes Diabetes in her daughter. There is no history of Colon cancer or Colon polyps. She was adopted.  ROS:   Please see the history of present illness.     All other systems reviewed and are negative.   Prior CV studies:   The following studies were reviewed today:  See above  Labs/Other Tests and Data Reviewed:    EKG:  No ECG reviewed.   Recent Labs: No results found for requested labs within last 8760 hours.   Recent Lipid Panel Lab Results  Component Value Date/Time   CHOL 131 09/22/2015 12:19 PM   TRIG 247.0 (H) 09/22/2015 12:19 PM   HDL 23.30 (L) 09/22/2015 12:19 PM   CHOLHDL 6 09/22/2015 12:19 PM   LDLCALC 93 01/23/2015 10:39 AM   LDLDIRECT 65.0 09/22/2015 12:19 PM    Wt Readings from Last 3 Encounters:  06/23/18 170 lb (77.1 kg)  02/13/18 172 lb 6.4 oz (78.2 kg)  02/05/18 170 lb (77.1 kg)     Objective:    Vital Signs:  Ht 5\' 6"  (1.676 m)   Wt 170 lb (77.1 kg)   BMI 27.44 kg/m    VITAL SIGNS:  reviewed GEN:  no acute distress EYES:  sclerae anicteric, EOMI - Extraocular Movements Intact RESPIRATORY:  normal respiratory effort, symmetric expansion CARDIOVASCULAR:  no peripheral edema SKIN:  no rash, lesions or ulcers. MUSCULOSKELETAL:  no obvious deformities. NEURO:  alert and oriented x 3, no obvious focal deficit PSYCH:  normal affect  ASSESSMENT & PLAN:    1.  Nonischemic cardiomyopathy/chronic systolic heart failure s/p BiV ICD: EF 15-20% on 04/10/17. Currently with NYHA class II symptoms. Continue current diuretic regimen with furosemide.Continue Coreg, benazepril and digoxin.  2. Essential HTN: No changes.  3. Atrial fibrillation: Remains ondigoxin andcarvedilol. She maintains anticoagulation with warfarin and is without bleeding problems.  4. Congenital third-degree heart block with pacemaker and BiVICD: Stable and followed by Dr. Lovena Le.   COVID-19 Education: The signs and symptoms of COVID-19 were discussed with the patient and how to seek care for testing (follow up with PCP or arrange E-visit).  The importance of social distancing was discussed today.  Time:   Today, I have spent 15 minutes with the patient with telehealth technology discussing the above problems.     Medication Adjustments/Labs and Tests Ordered: Current medicines are reviewed at length with the patient  today.  Concerns regarding medicines are outlined above.   Tests Ordered: No orders of the defined types were placed in this encounter.   Medication Changes: No orders of the defined types were placed in this encounter.   Disposition:  Follow up in 1 year(s)  Signed, Kate Sable, MD  06/23/2018 11:04 AM    [facility]

## 2018-06-23 NOTE — Patient Instructions (Addendum)
Medication Instructions:   Your physician recommends that you continue on your current medications as directed. Please refer to the Current Medication list given to you today.  Labwork:  NONE  Testing/Procedures:  NONE  Follow-Up:  Your physician recommends that you schedule a follow-up appointment in: 1 year with an extender. You will receive a reminder letter in the mail in about 10 months reminding you to call and schedule your appointment. If you don't receive this letter, please contact our office.  Any Other Special Instructions Will Be Listed Below (If Applicable).  If you need a refill on your cardiac medications before your next appointment, please call your pharmacy.

## 2018-06-29 ENCOUNTER — Other Ambulatory Visit: Payer: Self-pay | Admitting: Internal Medicine

## 2018-06-29 ENCOUNTER — Other Ambulatory Visit: Payer: Self-pay | Admitting: Endocrinology

## 2018-06-29 ENCOUNTER — Other Ambulatory Visit: Payer: Self-pay | Admitting: Cardiovascular Disease

## 2018-07-02 ENCOUNTER — Telehealth: Payer: Self-pay | Admitting: Pharmacist

## 2018-07-02 NOTE — Telephone Encounter (Signed)
Called pt to discuss changing from warfarin to Arion to help decrease # of office visits and exposure during COVID-19 pandemic, as well as for improved efficacy and safety profile. Spoke with Dr Raliegh Ip who is in agreement with switch as well. Pt has Medicaid insurance so her copay for a 3 month supply of Eliquis 5mg  BID would be $8.95. She states she would like to think about it. Advised her if she is interested in switching, we can do this at her INR check next week or any time in the future.

## 2018-07-08 ENCOUNTER — Ambulatory Visit (INDEPENDENT_AMBULATORY_CARE_PROVIDER_SITE_OTHER): Payer: Medicaid Other | Admitting: *Deleted

## 2018-07-08 DIAGNOSIS — I4891 Unspecified atrial fibrillation: Secondary | ICD-10-CM | POA: Diagnosis not present

## 2018-07-08 DIAGNOSIS — Z5181 Encounter for therapeutic drug level monitoring: Secondary | ICD-10-CM

## 2018-07-08 LAB — POCT INR: INR: 3.2 — AB (ref 2.0–3.0)

## 2018-07-08 NOTE — Patient Instructions (Signed)
Decrease coumadin to 1/2 tablet daily Continue greens as usual Recheck in 4 weeks Does not want to change to Eliquis

## 2018-07-21 ENCOUNTER — Other Ambulatory Visit: Payer: Self-pay | Admitting: Endocrinology

## 2018-07-21 ENCOUNTER — Other Ambulatory Visit: Payer: Self-pay | Admitting: Internal Medicine

## 2018-07-30 ENCOUNTER — Encounter: Payer: Medicaid Other | Admitting: *Deleted

## 2018-07-30 LAB — CUP PACEART REMOTE DEVICE CHECK
Battery Remaining Longevity: 54 mo
Battery Remaining Percentage: 90 %
Brady Statistic RA Percent Paced: 0 %
Brady Statistic RV Percent Paced: 97 %
Date Time Interrogation Session: 20200604134000
HighPow Impedance: 77 Ohm
Implantable Lead Implant Date: 20150225
Implantable Lead Implant Date: 20150225
Implantable Lead Location: 753858
Implantable Lead Location: 753860
Implantable Lead Model: 180
Implantable Lead Serial Number: 311545
Implantable Pulse Generator Implant Date: 20150225
Lead Channel Impedance Value: 432 Ohm
Lead Channel Impedance Value: 508 Ohm
Lead Channel Setting Pacing Amplitude: 2.4 V
Lead Channel Setting Pacing Amplitude: 2.4 V
Lead Channel Setting Pacing Pulse Width: 0.5 ms
Lead Channel Setting Pacing Pulse Width: 0.5 ms
Lead Channel Setting Sensing Sensitivity: 0.6 mV
Lead Channel Setting Sensing Sensitivity: 1 mV
Pulse Gen Serial Number: 114945

## 2018-08-06 ENCOUNTER — Ambulatory Visit (INDEPENDENT_AMBULATORY_CARE_PROVIDER_SITE_OTHER): Payer: Medicaid Other | Admitting: *Deleted

## 2018-08-06 DIAGNOSIS — I4891 Unspecified atrial fibrillation: Secondary | ICD-10-CM

## 2018-08-06 DIAGNOSIS — Z5181 Encounter for therapeutic drug level monitoring: Secondary | ICD-10-CM

## 2018-08-06 LAB — POCT INR: INR: 2.5 (ref 2.0–3.0)

## 2018-08-06 NOTE — Patient Instructions (Signed)
Continue coumadin 1/2 tablet daily Continue greens as usual Recheck in 4 weeks Does not want to change to Eliquis

## 2018-08-23 ENCOUNTER — Telehealth: Payer: Self-pay | Admitting: Medical

## 2018-08-23 NOTE — Telephone Encounter (Signed)
Patient called the answering service and reported her home ICD device monitor was cycling through various lights and appeared to be malfunctioning. She reports feeling fine with no complaints of chest pain, SOB, dizziness, lightheadedness, or syncope. No ICD firings. I am not familiar with the home devices and suggested she contact Dr. Tanna Furry office to speak with someone in the device clinic in the morning to determine what is going on with her home unit. Patient was in agreement with the plan.  Abigail Butts, PA-C 08/23/18; 4:52 PM

## 2018-08-24 ENCOUNTER — Telehealth: Payer: Self-pay | Admitting: Internal Medicine

## 2018-08-24 ENCOUNTER — Other Ambulatory Visit: Payer: Self-pay | Admitting: Endocrinology

## 2018-08-24 NOTE — Telephone Encounter (Signed)
New message   Patient is having issues with there device lights lighting up. Please call the patient.

## 2018-08-24 NOTE — Telephone Encounter (Signed)
Pt states she is having problems with the monitor lights coming on. I gave her the number to [employer] tech support to get additional help.

## 2018-09-01 ENCOUNTER — Other Ambulatory Visit: Payer: Self-pay | Admitting: Cardiovascular Disease

## 2018-09-09 ENCOUNTER — Ambulatory Visit (INDEPENDENT_AMBULATORY_CARE_PROVIDER_SITE_OTHER): Payer: Medicaid Other | Admitting: *Deleted

## 2018-09-09 DIAGNOSIS — Z5181 Encounter for therapeutic drug level monitoring: Secondary | ICD-10-CM | POA: Diagnosis not present

## 2018-09-09 DIAGNOSIS — I4891 Unspecified atrial fibrillation: Secondary | ICD-10-CM

## 2018-09-09 LAB — POCT INR: INR: 2.6 (ref 2.0–3.0)

## 2018-09-09 NOTE — Patient Instructions (Signed)
Continue coumadin 1/2 tablet daily Continue greens as usual Recheck in 4 weeks Does not want to change to Eliquis

## 2018-09-15 ENCOUNTER — Other Ambulatory Visit: Payer: Self-pay | Admitting: Endocrinology

## 2018-09-17 ENCOUNTER — Telehealth: Payer: Self-pay

## 2018-09-17 NOTE — Telephone Encounter (Signed)
PA initiated via [employer] call center for [place] U200 pens.  PA was approved from 09/17/2018-09/12/2019. PA approval #: [id number] Interaction ID #:[id number]

## 2018-09-20 ENCOUNTER — Other Ambulatory Visit: Payer: Self-pay | Admitting: Endocrinology

## 2018-09-20 DIAGNOSIS — IMO0002 Reserved for concepts with insufficient information to code with codable children: Secondary | ICD-10-CM

## 2018-09-20 DIAGNOSIS — E1129 Type 2 diabetes mellitus with other diabetic kidney complication: Secondary | ICD-10-CM

## 2018-09-20 DIAGNOSIS — E1165 Type 2 diabetes mellitus with hyperglycemia: Secondary | ICD-10-CM

## 2018-09-22 ENCOUNTER — Other Ambulatory Visit: Payer: Medicaid Other

## 2018-09-23 ENCOUNTER — Other Ambulatory Visit (INDEPENDENT_AMBULATORY_CARE_PROVIDER_SITE_OTHER): Payer: Medicaid Other

## 2018-09-23 ENCOUNTER — Other Ambulatory Visit: Payer: Self-pay

## 2018-09-23 DIAGNOSIS — E1165 Type 2 diabetes mellitus with hyperglycemia: Secondary | ICD-10-CM

## 2018-09-23 DIAGNOSIS — Z794 Long term (current) use of insulin: Secondary | ICD-10-CM

## 2018-09-23 DIAGNOSIS — E1129 Type 2 diabetes mellitus with other diabetic kidney complication: Secondary | ICD-10-CM

## 2018-09-23 DIAGNOSIS — IMO0002 Reserved for concepts with insufficient information to code with codable children: Secondary | ICD-10-CM

## 2018-09-23 LAB — COMPREHENSIVE METABOLIC PANEL
ALT: 19 U/L (ref 0–35)
AST: 24 U/L (ref 0–37)
Albumin: 3.8 g/dL (ref 3.5–5.2)
Alkaline Phosphatase: 218 U/L — ABNORMAL HIGH (ref 39–117)
BUN: 82 mg/dL — ABNORMAL HIGH (ref 6–23)
CO2: 23 mEq/L (ref 19–32)
Calcium: 9.4 mg/dL (ref 8.4–10.5)
Chloride: 102 mEq/L (ref 96–112)
Creatinine, Ser: 3.07 mg/dL — ABNORMAL HIGH (ref 0.40–1.20)
GFR: 18.8 mL/min — ABNORMAL LOW (ref >60.00)
Glucose, Bld: 197 mg/dL — ABNORMAL HIGH (ref 70–99)
Potassium: 4.6 mEq/L (ref 3.5–5.1)
Sodium: 136 mEq/L (ref 135–145)
Total Bilirubin: 1.6 mg/dL — ABNORMAL HIGH (ref 0.2–1.2)
Total Protein: 7.2 g/dL (ref 6.0–8.3)

## 2018-09-23 LAB — LIPID PANEL
Cholesterol: 109 mg/dL (ref 0–200)
HDL: 30.1 mg/dL — ABNORMAL LOW (ref >39.00)
LDL Cholesterol: 59 mg/dL (ref 0–99)
NonHDL: 78.87
Total CHOL/HDL Ratio: 4
Triglycerides: 101 mg/dL (ref 0.0–149.0)
VLDL: 20.2 mg/dL (ref 0.0–40.0)

## 2018-09-23 LAB — MICROALBUMIN / CREATININE URINE RATIO
Creatinine,U: 81.6 mg/dL
Microalb Creat Ratio: 7.7 mg/g (ref 0.0–30.0)
Microalb, Ur: 6.3 mg/dL — ABNORMAL HIGH (ref 0.0–1.9)

## 2018-09-23 LAB — HEMOGLOBIN A1C: Hgb A1c MFr Bld: 7.8 % — ABNORMAL HIGH (ref 4.6–6.5)

## 2018-09-24 ENCOUNTER — Ambulatory Visit: Payer: Medicaid Other | Admitting: Endocrinology

## 2018-09-24 LAB — FRUCTOSAMINE: Fructosamine: 369 umol/L — ABNORMAL HIGH (ref 0–285)

## 2018-09-25 ENCOUNTER — Other Ambulatory Visit: Payer: Medicaid Other

## 2018-09-29 ENCOUNTER — Other Ambulatory Visit: Payer: Self-pay | Admitting: Endocrinology

## 2018-10-05 ENCOUNTER — Encounter: Payer: Self-pay | Admitting: Endocrinology

## 2018-10-05 ENCOUNTER — Encounter: Payer: Medicaid Other | Admitting: Endocrinology

## 2018-10-05 ENCOUNTER — Other Ambulatory Visit: Payer: Self-pay

## 2018-10-05 NOTE — Progress Notes (Signed)
This encounter was created in error - please disregard.

## 2018-10-12 ENCOUNTER — Ambulatory Visit (INDEPENDENT_AMBULATORY_CARE_PROVIDER_SITE_OTHER): Payer: Medicaid Other | Admitting: Pharmacist Clinician (PhC)/ Clinical Pharmacy Specialist

## 2018-10-12 ENCOUNTER — Other Ambulatory Visit: Payer: Self-pay

## 2018-10-12 DIAGNOSIS — I4891 Unspecified atrial fibrillation: Secondary | ICD-10-CM

## 2018-10-12 DIAGNOSIS — Z7901 Long term (current) use of anticoagulants: Secondary | ICD-10-CM

## 2018-10-12 DIAGNOSIS — Z5181 Encounter for therapeutic drug level monitoring: Secondary | ICD-10-CM

## 2018-10-12 LAB — POCT INR: INR: 2.4 (ref 2.0–3.0)

## 2018-10-14 ENCOUNTER — Ambulatory Visit (INDEPENDENT_AMBULATORY_CARE_PROVIDER_SITE_OTHER): Payer: Medicaid Other | Admitting: Internal Medicine

## 2018-10-14 ENCOUNTER — Encounter: Payer: Self-pay | Admitting: Internal Medicine

## 2018-10-14 ENCOUNTER — Other Ambulatory Visit: Payer: Self-pay

## 2018-10-14 VITALS — BP 107/75 | HR 71 | Temp 97.8°F | Ht 68.0 in | Wt 182.0 lb

## 2018-10-14 DIAGNOSIS — Q246 Congenital heart block: Secondary | ICD-10-CM | POA: Diagnosis not present

## 2018-10-14 DIAGNOSIS — I482 Chronic atrial fibrillation, unspecified: Secondary | ICD-10-CM

## 2018-10-14 DIAGNOSIS — Z95810 Presence of automatic (implantable) cardiac defibrillator: Secondary | ICD-10-CM | POA: Diagnosis not present

## 2018-10-14 NOTE — Progress Notes (Signed)
HPI [name] returns today for follow up. She is a pleasant 58yo woman with a h/o chronic atrial fib and chb, s/p PPM who underwent upgrade to a BiV ICD 5yearsago. She hasclass 2bCHF.She feels well.She denies peripheral edema.No syncope or ICD shocks. Allergies  Allergen Reactions  . Wheat Swelling  . Latex Rash  . Penicillins Rash  . Sulfa Antibiotics Rash     Current Outpatient Medications  Medication Sig Dispense Refill  . acetaminophen (TYLENOL) 500 MG tablet Take 500 mg by mouth every 6 (six) hours as needed for moderate pain.    . Alcohol Swabs (ALCOHOL PREP) PADS 1 each by Does not apply route daily. 1000 each 3  . allopurinol (ZYLOPRIM) 100 MG tablet Take 150 mg by mouth every morning. Reported on 06/15/2015    . benazepril (LOTENSIN) 10 MG tablet Take 10 mg by mouth daily.    . Calcifediol ER (RAYALDEE) 30 MCG CPCR Take 1 capsule by mouth daily.    Marland Kitchen COREG CR 40 MG 24 hr capsule TAKE ONE CAPSULE BY MOUTH DAILY. 30 capsule 6  . digoxin (LANOXIN) 0.125 MG tablet TAKE 1 TABLET BY MOUTH DAILY EXCEPT ON SATURDAY AND SUNDAY. 20 tablet 3  . furosemide (LASIX) 80 MG tablet TAKE (1) TABLET BY MOUTH TWICE DAILY. 60 tablet 0  . insulin aspart (NOVOLOG FLEXPEN) 100 UNIT/ML FlexPen INJECT SUBCUTANEOUSLY 12 UNITS BEFORE BREAKFAST; 14 UNITS AT LUNCH; AND 16 UNITS AT DINNER. 15 mL 2  . insulin NPH Human (HUMULIN N) 100 UNIT/ML injection Inject 0.34 mLs (34 Units total) into the skin daily. 20 mL 3  . Insulin Pen Needle 31G X 5 MM MISC 1 each by Does not apply route daily. Use as instructed to check blood sugar 4 times daily. 200 each 3  . Insulin Syringe-Needle U-100 30G 1 ML MISC 1 each by Does not apply route daily. Use needle to inject insulin 3 times daily. 250 each 3  . TRESIBA FLEXTOUCH 200 UNIT/ML SOPN INJECT 56 UNITS INTO THE SKIN DAILY BEFORE BREAKFAST. 9 mL 0  . VICTOZA 18 MG/3ML SOPN INJECT 1.2MG  SUBCUTANEOUSLY DAILY AS DIRECTED. 6 mL 0  . warfarin (COUMADIN) 5 MG  tablet TAKE 1/2 DAILY EXCEPT ON WEDNESDAY TAKE 1 TABLET. 20 tablet 6   No current facility-administered medications for this visit.      Past Medical History:  Diagnosis Date  . Automatic implantable cardioverter-defibrillator in situ    a. 03/2013: BSX Energen CRTD BiV ICC, ser# [id number]  . Chronic anticoagulation    a. coumadin.  . Chronic atrial fibrillation    embolic rind  . Chronic kidney disease    creatinine- 1.8 in 09/2006 and 2.0 in 05/2007; 2.05 in 2011, 2.29 in 2012  . Chronic systolic CHF (congestive heart failure) ([place])    a. 03/2013 Echo: Sev LV dysfxn with sev diff HK, restrictive phys, diast dysfxn, mild MR, sev dil LA/RA, mod PR, PASP 34mmHg.  . Congenital third degree heart block    a. Guidant VVI pacemaker implanted in 09/1997; b. gen change in 01/2004;  c. 03/2013 upgrade to [place], ser# [id number].  . Dizziness and giddiness 05/19/2012  . Dysphagia 08/14/2011   FEB 2013 EGD/DIL 16 MM; GERD; h/o gastroesophageal reflux disease; + H. Pylori gastritis   . GERD (gastroesophageal reflux disease)   . Helicobacter pylori gastritis 08/14/2011  . IDDM (insulin dependent diabetes mellitus) ([place])   . Kidney stones 1990's  . Nonischemic cardiomyopathy ([place])  a. 03/2013 Echo: Sev LV dysfxn with sev diff HK, restrictive phys, diast dysfxn, mild MR, sev dil LA/RA, mod PR, PASP 53mmHg;  b. 03/2013: [place], ser# [id number].  Marland Kitchen Potassium (K) excess   . Stroke [facility]) 1999   denies residual on 04/21/2013    ROS:   All systems reviewed and negative except as noted in the HPI.   Past Surgical History:  Procedure Laterality Date  . BI-VENTRICULAR IMPLANTABLE CARDIOVERTER DEFIBRILLATOR  (CRT-D)  04/21/2013  . BI-VENTRICULAR IMPLANTABLE CARDIOVERTER DEFIBRILLATOR UPGRADE N/A 04/21/2013   Procedure: BI-VENTRICULAR IMPLANTABLE CARDIOVERTER DEFIBRILLATOR UPGRADE;  Surgeon: Evans Lance, MD;  Location: [facility] CATH LAB;  Service: Cardiovascular;  Laterality: N/A;  .  COLONOSCOPY  04/23/2011   TOI:[id number] COLON POLYP/ INTERNAL HEMORRHOIDS/ TORTUOUS COLON  . INSERT / REPLACE / REMOVE PACEMAKER  09/1997   Guidant VVI [employer]  . PACEMAKER GENERATOR CHANGE  01/2004  . UPPER GASTROINTESTINAL ENDOSCOPY  FEB 2013   RING/DIL TO 16 MM, H PYLORI GASTRITIS     Family History  Adopted: Yes  Problem Relation Age of Onset  . Diabetes Daughter   . Colon cancer Neg Hx   . Colon polyps Neg Hx      Social History   Socioeconomic History  . Marital status: Married    Spouse name: Not on file  . Number of children: 4  . Years of education: Not on file  . Highest education level: Not on file  Occupational History    Employer: UNEMPLOYED  Social Needs  . Financial resource strain: Not on file  . Food insecurity    Worry: Not on file    Inability: Not on file  . Transportation needs    Medical: Not on file    Non-medical: Not on file  Tobacco Use  . Smoking status: Never Smoker  . Smokeless tobacco: Never Used  . Tobacco comment: Never smoked  Substance and Sexual Activity  . Alcohol use: No    Alcohol/week: 0.0 standard drinks  . Drug use: No  . Sexual activity: Not Currently    Birth control/protection: Post-menopausal  Lifestyle  . Physical activity    Days per week: Not on file    Minutes per session: Not on file  . Stress: Not on file  Relationships  . Social Herbalist on phone: Not on file    Gets together: Not on file    Attends religious service: Not on file    Active member of club or organization: Not on file    Attends meetings of clubs or organizations: Not on file    Relationship status: Not on file  . Intimate partner violence    Fear of current or ex partner: Not on file    Emotionally abused: Not on file    Physically abused: Not on file    Forced sexual activity: Not on file  Other Topics Concern  . Not on file  Social History Narrative  . Not on file     BP 107/75 (BP Location: Right Arm)    Pulse 71   Temp 97.8 F (36.6 C)   Ht 5\' 8"  (1.727 m)   SpO2 97%   BMI 25.85 kg/m   Physical Exam:  Well appearing NAD HEENT: Unremarkable Neck:  No JVD, no thyromegally Lymphatics:  No adenopathy Back:  No CVA tenderness Lungs:  Clear with no wheezes HEART:  Regular rate rhythm, no murmurs, no rubs, no clicks Abd:  soft, positive bowel sounds, no organomegally, no rebound, no guarding Ext:  2 plus pulses, no edema, no cyanosis, no clubbing Skin:  No rashes no nodules Neuro:  CN II through XII intact, motor grossly intact  DEVICE  Normal device function.  See PaceArt for details.   Assess/Plan: 1. Chronic systolic heart failure - despite her LV dysfunction, she has class 2 symptoms. She is encouraged to continue her current meds and maintain a low sodium diet. 2. ICD - her [place] ICD is working normally. We will recheck in several months. 3. Chronic atrial fib - her VR is controlled and she is asymptomatic. She will continue systemic anti-coagulation with coumadin.  Mikle Bosworth.D

## 2018-10-14 NOTE — Patient Instructions (Signed)
Medication Instructions:  Your physician recommends that you continue on your current medications as directed. Please refer to the Current Medication list given to you today.  If you need a refill on your cardiac medications before your next appointment, please call your pharmacy.   Lab work: NONE  If you have labs (blood work) drawn today and your tests are completely normal, you will receive your results only by: . MyChart Message (if you have MyChart) OR . A paper copy in the mail If you have any lab test that is abnormal or we need to change your treatment, we will call you to review the results.  Testing/Procedures: NONE   Follow-Up: At [facility], you and your health needs are our priority.  As part of our continuing mission to provide you with exceptional heart care, we have created designated Provider Care Teams.  These Care Teams include your primary Cardiologist (physician) and Advanced Practice Providers (APPs -  Physician Assistants and Nurse Practitioners) who all work together to provide you with the care you need, when you need it. You will need a follow up appointment in 1 years.  Please call our office 2 months in advance to schedule this appointment.  You may see Suresh Koneswaran, MD or one of the following Advanced Practice Providers on your designated Care Team:   Brittany Strader, PA-C ([place] Office) . Michele Lenze, PA-C ([place] Office)  Any Other Special Instructions Will Be Listed Below (If Applicable). Thank you for choosing [facility]!     

## 2018-10-16 LAB — CUP PACEART INCLINIC DEVICE CHECK
Brady Statistic RV Percent Paced: 97 %
Date Time Interrogation Session: 20200819040000
HighPow Impedance: 56 Ohm
Implantable Lead Implant Date: 20150225
Implantable Lead Implant Date: 20150225
Implantable Lead Location: 753858
Implantable Lead Location: 753860
Implantable Lead Model: 180
Implantable Lead Serial Number: 311545
Implantable Pulse Generator Implant Date: 20150225
Lead Channel Impedance Value: 2500 Ohm
Lead Channel Impedance Value: 402 Ohm
Lead Channel Impedance Value: 517 Ohm
Lead Channel Pacing Threshold Amplitude: 0.5 V
Lead Channel Pacing Threshold Amplitude: 0.5 V
Lead Channel Pacing Threshold Pulse Width: 0.5 ms
Lead Channel Pacing Threshold Pulse Width: 0.5 ms
Lead Channel Setting Pacing Amplitude: 2.4 V
Lead Channel Setting Pacing Amplitude: 2.4 V
Lead Channel Setting Pacing Pulse Width: 0.5 ms
Lead Channel Setting Pacing Pulse Width: 0.5 ms
Lead Channel Setting Sensing Sensitivity: 0.6 mV
Lead Channel Setting Sensing Sensitivity: 1 mV
Pulse Gen Serial Number: 114945

## 2018-10-19 ENCOUNTER — Encounter: Payer: Self-pay | Admitting: Endocrinology

## 2018-10-19 ENCOUNTER — Ambulatory Visit (INDEPENDENT_AMBULATORY_CARE_PROVIDER_SITE_OTHER): Payer: Medicaid Other | Admitting: Endocrinology

## 2018-10-19 ENCOUNTER — Other Ambulatory Visit: Payer: Self-pay

## 2018-10-19 DIAGNOSIS — E1165 Type 2 diabetes mellitus with hyperglycemia: Secondary | ICD-10-CM | POA: Diagnosis not present

## 2018-10-19 DIAGNOSIS — Z794 Long term (current) use of insulin: Secondary | ICD-10-CM | POA: Diagnosis not present

## 2018-10-19 NOTE — Progress Notes (Signed)
Patient ID: [name], female   DOB: 1960-03-13, 58 y.o.   MRN: [id number]   Reason for Appointment : Follow up for  Diabetes  History of Present Illness    Today's office visit was provided via telemedicine using video technique Explained to the patient and the the limitations of evaluation and management by telemedicine and the availability of in person appointments.  The patient understood the limitations and agreed to proceed. Patient also understood that the telehealth visit is billable. . Location of the patient: Home . Location of the provider: Office Only the patient and myself were participating in the encounter          Diagnosis: Type 2 diabetes mellitus, date of diagnosis: 1990        Past history: She has been on insulin since 2007. Generally has had poor control in requiring large doses of insulin. Blood sugars also have been previously variable making her control difficult. She has had difficulty following instructions for mealtime insulin consistently in the past  Has been on a multiple injection regimen of Lantus twice a day, NovoLog a.c. and NPH at bedtime A1c has been as high as 11.3 previously   Recent history: INSULIN regimen is described as: [place] 46,  Humulin N 25 at bedtime, NovoLog a.c.12- 14-16 before meals  Her A1c is again higher at 7.8, was as low as 7% earlier this year Fructosamine is high at 369   Glucose patterns , current management and problems identified:  She was asked to start checking readings after meals to help her insulin adjustment but she is still mostly doing readings fasting in the a.m. and has a few readings at bedtime  Her NPH was reduced by 5 minutes on the last visit because of having occasional low normal readings in the mornings  She still continues to have some tendency to overnight hypoglycemia and she thinks that this is more when she is not eating much at suppertime  She is sometimes trying to have a  bedtime snack also  She believes that her sugar may get low overnight about 3 times a week  FASTING blood sugars otherwise have been mostly near normal  She has a couple of readings at dinnertime which are variable  Blood sugars at bedtime appear to be fairly good except for an isolated high reading of 297  She is not clear if she is consistent with her NovoLog before each meal; she says she will adjusted based on her blood sugar level but usually has low readings before lunch and only occasional readings before supper  At breakfast she is sometimes eating only cereal or toast without any protein otherwise may have a higher fat biscuit  Overall she thinks her portions are small  Her weight is slightly higher but she thinks this may be from fluid retention  Glucose monitoring:  done  1-2 times a day         Glucometer:  Accu-Chek Aviva        Blood Glucose readings from meter review done by patient   PRE-MEAL Fasting Lunch Dinner Bedtime/overnight Overall  Glucose range:  59-156   80-189  65-193   Mean/median:      139   POST-MEAL PC Breakfast PC Lunch PC Dinner  Glucose range:  83, 89   85-197  Mean/median:       PREVIOUS readings:  PRE-MEAL Fasting Lunch Dinner  overnight Overall  Glucose range:  69-162  69-193 ?  80-177  Mean/median:     ?   POST-MEAL PC Breakfast PC Lunch PC Dinner  Glucose range:    65-248  Mean/median:         Physical activity: exercise: Walking more regularly  Self-care: The diet that the patient has been following is low fat,  Lunch : Sometimes salad otherwise sandwiches  Meals: 3 meals per day.  breakfast is 9 am at lunch 12 pm Supper at about 6-7 PM, mealtimes are variable           Dietician visit: Most recent:.Several years ago  Last CDE consult in 10/2017          Wt Readings from Last 3 Encounters:  10/14/18 182 lb (82.6 kg)  10/05/18 170 lb (77.1 kg)  06/23/18 170 lb (77.1 kg)   A1c results  Lab Results  Component Value  Date   HGBA1C 7.8 (H) 09/23/2018   HGBA1C 7.4 (A) 06/16/2018   HGBA1C 7.0 (A) 02/13/2018   Lab Results  Component Value Date   MICROALBUR 6.3 (H) 09/23/2018   LDLCALC 59 09/23/2018   CREATININE 3.07 (H) 09/23/2018   Lab Results  Component Value Date   FRUCTOSAMINE 369 (H) 09/23/2018   FRUCTOSAMINE 398 (H) 09/22/2015   FRUCTOSAMINE 344 (H) 01/27/2013      Allergies as of 10/19/2018      Reactions   Wheat Swelling   Latex Rash   Penicillins Rash   Sulfa Antibiotics Rash      Medication List       Accurate as of October 19, 2018 10:57 AM. If you have any questions, ask your nurse or doctor.        STOP taking these medications   [place] 30 MCG Cpcr Generic drug: Calcifediol ER Stopped by: Elayne Snare, MD     TAKE these medications   acetaminophen 500 MG tablet Commonly known as: TYLENOL Take 500 mg by mouth every 6 (six) hours as needed for moderate pain.   Alcohol Prep Pads 1 each by Does not apply route daily.   allopurinol 100 MG tablet Commonly known as: ZYLOPRIM Take 150 mg by mouth every morning. Reported on 06/15/2015   benazepril 10 MG tablet Commonly known as: LOTENSIN Take 10 mg by mouth daily.   Coreg CR 40 MG 24 hr capsule Generic drug: carvedilol TAKE ONE CAPSULE BY MOUTH DAILY.   digoxin 0.125 MG tablet Commonly known as: LANOXIN TAKE 1 TABLET BY MOUTH DAILY EXCEPT ON SATURDAY AND SUNDAY.   furosemide 80 MG tablet Commonly known as: LASIX TAKE (1) TABLET BY MOUTH TWICE DAILY.   insulin aspart 100 UNIT/ML FlexPen Commonly known as: NovoLOG FlexPen INJECT SUBCUTANEOUSLY 12 UNITS BEFORE BREAKFAST; 14 UNITS AT LUNCH; AND 16 UNITS AT DINNER.   insulin NPH Human 100 UNIT/ML injection Commonly known as: HumuLIN N Inject 0.34 mLs (34 Units total) into the skin daily.   Insulin Pen Needle 31G X 5 MM Misc 1 each by Does not apply route daily. Use as instructed to check blood sugar 4 times daily.   Insulin Syringe-Needle U-100 30G 1 ML Misc  1 each by Does not apply route daily. Use needle to inject insulin 3 times daily.   Tyler Aas FlexTouch 200 UNIT/ML Sopn Generic drug: Insulin Degludec INJECT 56 UNITS INTO THE SKIN DAILY BEFORE BREAKFAST.   Victoza 18 MG/3ML Sopn Generic drug: liraglutide INJECT 1.2MG  SUBCUTANEOUSLY DAILY AS DIRECTED.   warfarin 5 MG tablet Commonly known as: COUMADIN Take as directed by the anticoagulation clinic. If you  are unsure how to take this medication, talk to your nurse or doctor. Original instructions: TAKE 1/2 DAILY EXCEPT ON WEDNESDAY TAKE 1 TABLET. What changed: See the new instructions.       Allergies:  Allergies  Allergen Reactions  . Wheat Swelling  . Latex Rash  . Penicillins Rash  . Sulfa Antibiotics Rash    Past Medical History:  Diagnosis Date  . Automatic implantable cardioverter-defibrillator in situ    a. 03/2013: BSX Energen CRTD BiV ICC, ser# [id number]  . Chronic anticoagulation    a. coumadin.  . Chronic atrial fibrillation    embolic rind  . Chronic kidney disease    creatinine- 1.8 in 09/2006 and 2.0 in 05/2007; 2.05 in 2011, 2.29 in 2012  . Chronic systolic CHF (congestive heart failure) ([place])    a. 03/2013 Echo: Sev LV dysfxn with sev diff HK, restrictive phys, diast dysfxn, mild MR, sev dil LA/RA, mod PR, PASP 88mmHg.  . Congenital third degree heart block    a. Guidant VVI pacemaker implanted in 09/1997; b. gen change in 01/2004;  c. 03/2013 upgrade to [place], ser# [id number].  . Dizziness and giddiness 05/19/2012  . Dysphagia 08/14/2011   FEB 2013 EGD/DIL 16 MM; GERD; h/o gastroesophageal reflux disease; + H. Pylori gastritis   . GERD (gastroesophageal reflux disease)   . Helicobacter pylori gastritis 08/14/2011  . IDDM (insulin dependent diabetes mellitus) ([place])   . Kidney stones 1990's  . Nonischemic cardiomyopathy ([place])    a. 03/2013 Echo: Sev LV dysfxn with sev diff HK, restrictive phys, diast dysfxn, mild MR, sev dil LA/RA, mod PR, PASP  80mmHg;  b. 03/2013: BSX Energen CRTD BiV ICC, ser# [id number].  Marland Kitchen Potassium (K) excess   . Stroke [facility]) 1999   denies residual on 04/21/2013    Past Surgical History:  Procedure Laterality Date  . BI-VENTRICULAR IMPLANTABLE CARDIOVERTER DEFIBRILLATOR  (CRT-D)  04/21/2013  . BI-VENTRICULAR IMPLANTABLE CARDIOVERTER DEFIBRILLATOR UPGRADE N/A 04/21/2013   Procedure: BI-VENTRICULAR IMPLANTABLE CARDIOVERTER DEFIBRILLATOR UPGRADE;  Surgeon: Evans Lance, MD;  Location: [facility] C[facility] LAB;  Service: Cardiovascular;  Laterality: N/A;  . COLONOSCOPY  04/23/2011   DVV:OHYWVPXTGG COLON POLYP/ INTERNAL HEMORRHOIDS/ TORTUOUS COLON  . INSERT / REPLACE / REMOVE PACEMAKER  09/1997   Guidant VVI [employer]  . PACEMAKER GENERATOR CHANGE  01/2004  . UPPER GASTROINTESTINAL ENDOSCOPY  FEB 2013   RING/DIL TO 16 MM, H PYLORI GASTRITIS    Family History  Adopted: Yes  Problem Relation Age of Onset  . Diabetes Daughter   . Colon cancer Neg Hx   . Colon polyps Neg Hx     Social History:  reports that she has never smoked. She has never used smokeless tobacco. She reports that she does not drink alcohol or use drugs.    Review of Systems:   She has had chronic renal insufficiency, followed by nephrologist Last creatinine on file 2.4 in 6/19 No history of microalbuminuria   Lab Results  Component Value Date   CREATININE 3.07 (H) 09/23/2018   BUN 82 (H) 09/23/2018   NA 136 09/23/2018   K 4.6 09/23/2018   CL 102 09/23/2018   CO2 23 09/23/2018    She has had cardiomyopathy and History of atrial fibrillation followed by cardiologist  Lipids: Has dyslipidemia with low HDL, high triglycerides, now improved Not on statins, followed by PCP   Lab Results  Component Value Date   CHOL 109 09/23/2018   HDL 30.10 (L) 09/23/2018  LDLCALC 59 09/23/2018   LDLDIRECT 65.0 09/22/2015   TRIG 101.0 09/23/2018   CHOLHDL 4 09/23/2018    Foot exam in 01/2018 normal except absent pedal pulses on the right No  symptoms of neuropathy recently    Physical Examination:  There were no vitals taken for this visit.   ASSESSMENT:  Diabetes type 1:  See history of present illness for detailed discussion of her current blood sugar patterns, treatment regimen, meal planning and problems identified  Her A1c has gone up again to 7.8   Difficult to assess her blood sugar patterns to identify where she is getting hyperglycemic since she only recently started checking readings nonfasting  She does appear to have mostly fairly good blood sugars at night after dinner with an unusually high reading of 297 once and also a high reading of 189 before dinner Not clear if she is always taking her NovoLog with every meal and likely skipping the dose if blood sugars are not low normal  Weight is difficult to assess because of her edema and may have gone up  Discussed that her A1c is increasing and likely from high postprandial readings Fructosamine is still high although not at her highest level  CKD: Last creatinine about 3, followed by nephrologist  LIPIDS: LDL and triglycerides are in target range without any treatment  PLAN:    She needs to continue checking readings later in the day but also 2 to 3 hours after meals  She currently is not checking frequently enough to qualify for the freestyle libre  Advised her to check more readings after meals to help adjust her NovoLog  She does need to adjust her NovoLog based on meal size and carbohydrate  In the morning she does need to add protein to her meals consistently otherwise reduce her NovoLog by at least 2 units  She can take her NovoLog before eating even if she does not have a blood sugar reading before the meal  Again she is still reluctant to consider insulin pump even though this would be more convenient and allow simpler regimen for insulin  Reduce NPH to 20 units instead of 25  No change in [place]  Call if having increasing low blood  sugar problems  There are no Patient Instructions on file for this visit.    Counseling time on subjects discussed in assessment and plan sections is over 50% of today's 25 minute visit     Elayne Snare 10/19/2018, 10:57 AM

## 2018-10-24 ENCOUNTER — Other Ambulatory Visit: Payer: Self-pay | Admitting: Endocrinology

## 2018-10-24 ENCOUNTER — Other Ambulatory Visit: Payer: Self-pay | Admitting: Internal Medicine

## 2018-11-24 ENCOUNTER — Encounter: Payer: Self-pay | Admitting: Gastroenterology

## 2018-11-26 ENCOUNTER — Other Ambulatory Visit: Payer: Self-pay | Admitting: Cardiovascular Disease

## 2018-11-27 ENCOUNTER — Emergency Department (HOSPITAL_COMMUNITY)
Admission: EM | Admit: 2018-11-27 | Discharge: 2018-11-27 | Disposition: A | Discharge status: Home / Self Care | Payer: Medicaid Other | Attending: Emergency Medicine | Admitting: Emergency Medicine

## 2018-11-27 ENCOUNTER — Encounter (HOSPITAL_COMMUNITY): Payer: Self-pay | Admitting: Emergency Medicine

## 2018-11-27 ENCOUNTER — Other Ambulatory Visit: Payer: Self-pay

## 2018-11-27 DIAGNOSIS — I130 Hypertensive heart and chronic kidney disease with heart failure and stage 1 through stage 4 chronic kidney disease, or unspecified chronic kidney disease: Secondary | ICD-10-CM | POA: Insufficient documentation

## 2018-11-27 DIAGNOSIS — I5022 Chronic systolic (congestive) heart failure: Secondary | ICD-10-CM | POA: Diagnosis not present

## 2018-11-27 DIAGNOSIS — Z7901 Long term (current) use of anticoagulants: Secondary | ICD-10-CM | POA: Insufficient documentation

## 2018-11-27 DIAGNOSIS — Z794 Long term (current) use of insulin: Secondary | ICD-10-CM | POA: Diagnosis not present

## 2018-11-27 DIAGNOSIS — E1122 Type 2 diabetes mellitus with diabetic chronic kidney disease: Secondary | ICD-10-CM | POA: Insufficient documentation

## 2018-11-27 DIAGNOSIS — R58 Hemorrhage, not elsewhere classified: Secondary | ICD-10-CM

## 2018-11-27 DIAGNOSIS — K0889 Other specified disorders of teeth and supporting structures: Secondary | ICD-10-CM

## 2018-11-27 DIAGNOSIS — K91840 Postprocedural hemorrhage and hematoma of a digestive system organ or structure following a digestive system procedure: Secondary | ICD-10-CM | POA: Insufficient documentation

## 2018-11-27 DIAGNOSIS — T45515A Adverse effect of anticoagulants, initial encounter: Secondary | ICD-10-CM

## 2018-11-27 DIAGNOSIS — Z79899 Other long term (current) drug therapy: Secondary | ICD-10-CM | POA: Diagnosis not present

## 2018-11-27 DIAGNOSIS — Z8673 Personal history of transient ischemic attack (TIA), and cerebral infarction without residual deficits: Secondary | ICD-10-CM | POA: Insufficient documentation

## 2018-11-27 DIAGNOSIS — N184 Chronic kidney disease, stage 4 (severe): Secondary | ICD-10-CM | POA: Insufficient documentation

## 2018-11-27 NOTE — ED Triage Notes (Signed)
Pt had tooth pulled on Tues and states that she removed packing tonight and tooth started bleeding again. Pt states she is on Coumadin.

## 2018-11-27 NOTE — Discharge Instructions (Addendum)
Try a moistened tea bag for 10 minutes if the bleeding returns.  Continue liquids and soft foods.  Call your dentist tomorrow for follow-up

## 2018-11-29 ENCOUNTER — Other Ambulatory Visit: Payer: Self-pay

## 2018-11-29 ENCOUNTER — Encounter (HOSPITAL_COMMUNITY): Payer: Self-pay | Admitting: *Deleted

## 2018-11-29 ENCOUNTER — Emergency Department (HOSPITAL_COMMUNITY)
Admission: EM | Admit: 2018-11-29 | Discharge: 2018-11-30 | Disposition: A | Discharge status: Home / Self Care | Payer: Medicaid Other | Attending: Emergency Medicine | Admitting: Emergency Medicine

## 2018-11-29 DIAGNOSIS — Z79899 Other long term (current) drug therapy: Secondary | ICD-10-CM | POA: Insufficient documentation

## 2018-11-29 DIAGNOSIS — D696 Thrombocytopenia, unspecified: Secondary | ICD-10-CM | POA: Diagnosis not present

## 2018-11-29 DIAGNOSIS — Z794 Long term (current) use of insulin: Secondary | ICD-10-CM | POA: Diagnosis not present

## 2018-11-29 DIAGNOSIS — K91840 Postprocedural hemorrhage and hematoma of a digestive system organ or structure following a digestive system procedure: Secondary | ICD-10-CM | POA: Diagnosis not present

## 2018-11-29 DIAGNOSIS — K068 Other specified disorders of gingiva and edentulous alveolar ridge: Secondary | ICD-10-CM

## 2018-11-29 DIAGNOSIS — Z862 Personal history of diseases of the blood and blood-forming organs and certain disorders involving the immune mechanism: Secondary | ICD-10-CM

## 2018-11-29 DIAGNOSIS — I5022 Chronic systolic (congestive) heart failure: Secondary | ICD-10-CM | POA: Insufficient documentation

## 2018-11-29 DIAGNOSIS — Z7901 Long term (current) use of anticoagulants: Secondary | ICD-10-CM | POA: Diagnosis not present

## 2018-11-29 DIAGNOSIS — E1122 Type 2 diabetes mellitus with diabetic chronic kidney disease: Secondary | ICD-10-CM | POA: Insufficient documentation

## 2018-11-29 DIAGNOSIS — I482 Chronic atrial fibrillation, unspecified: Secondary | ICD-10-CM | POA: Diagnosis not present

## 2018-11-29 DIAGNOSIS — K0889 Other specified disorders of teeth and supporting structures: Secondary | ICD-10-CM | POA: Diagnosis present

## 2018-11-29 DIAGNOSIS — Z950 Presence of cardiac pacemaker: Secondary | ICD-10-CM | POA: Diagnosis not present

## 2018-11-29 DIAGNOSIS — N185 Chronic kidney disease, stage 5: Secondary | ICD-10-CM | POA: Insufficient documentation

## 2018-11-29 NOTE — ED Provider Notes (Signed)
[facility] EMERGENCY DEPARTMENT Provider Note   CSN: [id number] Arrival date & time: 11/27/18  2044     History   Chief Complaint Chief Complaint  Patient presents with  . Tooth bleeding    HPI [name] is a 58 y.o. female.     HPI   [name] is a 58 y.o. female who is anticoagulated on Coumadin for chronic atrial fibrillation presents to the Emergency Department complaining of intermittent bleeding at the site of a recent dental extraction. She had her right lower molar extracted 4 days ago.  She noticed bleeding earlier tonight that she controlled with a gauze packing.  She states bleeding has improved since arrival.  She denies fever, facial swelling and pain.     Past Medical History:  Diagnosis Date  . Automatic implantable cardioverter-defibrillator in situ    a. 03/2013: BSX Energen CRTD BiV ICC, ser# [id number]  . Chronic anticoagulation    a. coumadin.  . Chronic atrial fibrillation (H[facility])    embolic rind  . Chronic kidney disease    creatinine- 1.8 in 09/2006 and 2.0 in 05/2007; 2.05 in 2011, 2.29 in 2012  . Chronic systolic CHF (congestive heart failure) ([place])    a. 03/2013 Echo: Sev LV dysfxn with sev diff HK, restrictive phys, diast dysfxn, mild MR, sev dil LA/RA, mod PR, PASP 69mmHg.  . Congenital third degree heart block    a. Guidant VVI pacemaker implanted in 09/1997; b. gen change in 01/2004;  c. 03/2013 upgrade to [place], ser# [id number].  . Dizziness and giddiness 05/19/2012  . Dysphagia 08/14/2011   FEB 2013 EGD/DIL 16 MM; GERD; h/o gastroesophageal reflux disease; + H. Pylori gastritis   . GERD (gastroesophageal reflux disease)   . Helicobacter pylori gastritis 08/14/2011  . IDDM (insulin dependent diabetes mellitus)   . Kidney stones 1990's  . Nonischemic cardiomyopathy ([place])    a. 03/2013 Echo: Sev LV dysfxn with sev diff HK, restrictive phys, diast dysfxn, mild MR, sev dil LA/RA, mod PR, PASP 54mmHg;  b. 03/2013: BSX Energen CRTD  BiV ICC, ser# [id number].  Marland Kitchen Potassium (K) excess   . Stroke [facility]) 1999   denies residual on 04/21/2013    Patient Active Problem List   Diagnosis Date Noted  . Helicobacter pylori gastritis 10/31/2016  . Colon adenomas 06/15/2015  . Biventricular ICD (implantable cardioverter-defibrillator) in place 05/21/2013  . Other and unspecified hyperlipidemia 04/11/2013  . Chronic systolic heart failure ([place]) 04/07/2013  . Encounter for therapeutic drug monitoring 03/29/2013  . Chronic kidney disease, stage IV (severe) ([place]) 01/27/2013  . Dyspepsia 09/10/2012  . Gout 05/15/2012  . Chronic anticoagulation   . Congenital third degree heart block   . 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 Cardiac pacemaker in situ 04/24/2009  . 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 CARDIOMYOPATHY 01/20/2008  . ATRIAL FIBRILLATION, CHRONIC 01/20/2008  . Cardiorenal syndrome/chronic kidney disease 01/20/2008    Past Surgical History:  Procedure Laterality Date  . BI-VENTRICULAR IMPLANTABLE CARDIOVERTER DEFIBRILLATOR  (CRT-D)  04/21/2013  . BI-VENTRICULAR IMPLANTABLE CARDIOVERTER DEFIBRILLATOR UPGRADE N/A 04/21/2013   Procedure: BI-VENTRICULAR IMPLANTABLE CARDIOVERTER DEFIBRILLATOR UPGRADE;  Surgeon: Evans Lance, MD;  Location[facility] C[facility] LAB;  Service: Cardiovascular;  Laterality: N/A;  . COLONOSCOPY  04/23/2011   KGM:[id number] COLON POLYP/ INTERNAL HEMORRHOIDS/ TORTUOUS COLON  . INSERT / REPLACE / REMOVE PACEMAKER  09/1997   Guidant VVI [employer]  . PACEMAKER GENERATOR  CHANGE  01/2004  . UPPER GASTROINTESTINAL ENDOSCOPY  FEB 2013   RING/DIL TO 16 MM, H PYLORI GASTRITIS     OB History    Gravida  4   Para      Term      Preterm      AB      Living  4     SAB      TAB      Ectopic      Multiple      Live Births               Home Medications    Prior to Admission medications   Medication Sig  Start Date End Date Taking? Authorizing Provider  acetaminophen (TYLENOL) 500 MG tablet Take 500 mg by mouth every 6 (six) hours as needed for moderate pain.    [provider]  Alcohol Swabs (ALCOHOL PREP) PADS 1 each by Does not apply route daily. 02/11/18   Elayne Snare, MD  allopurinol (ZYLOPRIM) 100 MG tablet Take 150 mg by mouth every morning. Reported on 06/15/2015    [provider]  benazepril (LOTENSIN) 10 MG tablet Take 10 mg by mouth daily.    [provider]  COREG CR 40 MG 24 hr capsule TAKE ONE CAPSULE BY MOUTH DAILY. 11/24/17   Evans Lance, MD  digoxin (LANOXIN) 0.125 MG tablet TAKE 1 TABLET BY MOUTH DAILY EXCEPT ON SATURDAY AND SUNDAY. 11/27/18   Herminio Commons, MD  furosemide (LASIX) 80 MG tablet TAKE (1) TABLET BY MOUTH TWICE DAILY. 10/26/18   Herminio Commons, MD  insulin NPH Human (HUMULIN N) 100 UNIT/ML injection Inject 0.34 mLs (34 Units total) into the skin daily. 07/22/18   Elayne Snare, MD  Insulin Pen Needle 31G X 5 MM MISC 1 each by Does not apply route daily. Use as instructed to check blood sugar 4 times daily. 02/11/18   Elayne Snare, MD  Insulin Syringe-Needle U-100 30G 1 ML MISC 1 each by Does not apply route daily. Use needle to inject insulin 3 times daily. 02/13/18   Elayne Snare, MD  NOVOLOG FLEXPEN 100 UNIT/ML FlexPen INJECT SUBCUTANEOUSLY 12 UNITS BEFORE BREAKFAST; 14 UNITS AT LUNCH; AND 16 UNITS AT DINNER. 10/25/18   Elayne Snare, MD  TRESIBA FLEXTOUCH 200 UNIT/ML SOPN INJECT 56 UNITS INTO THE SKIN DAILY BEFORE BREAKFAST. 10/25/18   Elayne Snare, MD  VICTOZA 18 MG/3ML SOPN INJECT 1.2MG  SUBCUTANEOUSLY DAILY AS DIRECTED. 09/30/18   Elayne Snare, MD  warfarin (COUMADIN) 5 MG tablet TAKE 1/2 DAILY EXCEPT ON WEDNESDAY TAKE 1 TABLET. Patient taking differently: TAKE 1/2 DAILY. 06/30/18   Herminio Commons, MD    Family History Family History  Adopted: Yes  Problem Relation Age of Onset  . Diabetes Daughter   . Colon cancer Neg Hx   .  Colon polyps Neg Hx     Social History Social History   Tobacco Use  . Smoking status: Never Smoker  . Smokeless tobacco: Never Used  . Tobacco comment: Never smoked  Substance Use Topics  . Alcohol use: No    Alcohol/week: 0.0 standard drinks  . Drug use: No     Allergies   Wheat, Latex, Penicillins, and Sulfa antibiotics   Review of Systems Review of Systems  Constitutional: Negative for appetite change and fever.  HENT: Positive for dental problem (bleeding from site of a dental extraction). Negative for congestion, facial swelling, mouth sores, sore throat and trouble swallowing.  Eyes: Negative for pain.  Musculoskeletal: Negative for neck pain and neck stiffness.  Neurological: Negative for dizziness, facial asymmetry and headaches.  Hematological: Negative for adenopathy. Bruises/bleeds easily.     Physical Exam Updated Vital Signs BP 114/74 (BP Location: Right Arm)   Pulse 64   Temp 98.4 F (36.9 C) (Oral)   Resp 20   Ht 5\' 6"  (1.676 m)   Wt 77.1 kg   SpO2 97%   BMI 27.44 kg/m   Physical Exam Vitals signs and nursing note reviewed.  Constitutional:      General: She is not in acute distress.    Appearance: Normal appearance.  HENT:     Head: Normocephalic.     Mouth/Throat:     Mouth: Mucous membranes are moist.     Dentition: No dental tenderness or gingival swelling.     Pharynx: Oropharynx is clear. Uvula midline.     Comments:  right lower dental extraction site.  No significant bleeding at present.  No edema. Mild tenderness of the gums, no edema  Neurological:     Mental Status: She is alert.      ED Treatments / Results  Labs (all labs ordered are listed, but only abnormal results are displayed) Labs Reviewed - No data to display  EKG None  Radiology No results found.  Procedures Procedures (including critical care time)  Medications Ordered in ED Medications - No data to display   Initial Impression / Assessment and  Plan / ED Course  I have reviewed the triage vital signs and the nursing notes.  Pertinent labs & imaging results that were available during my care of the patient were reviewed by me and considered in my medical decision making (see chart for details).       Recent dental extraction, no significant bleeding at present. Pt is otherwise asymptomatic.  Agrees to application of tea bag to site if needed.  Advised to f/u with her dentist.  No clinical signs of infection    Final Clinical Impressions(s) / ED Diagnoses   Final diagnoses:  Bleeding on Coumadin  Pain, dental    ED Discharge Orders    None       Kem Parkinson, PA-C 11/29/18 1310    Maudie Flakes, MD 11/29/18 2120

## 2018-11-29 NOTE — ED Triage Notes (Signed)
Pt c/o continued bleeding that started tonight, reports that she had a tooth pulled a few days ago, was seen in er Friday night for oral bleeding, had been fine until tonight when the bleeding started again, pt also c/o pain to mouth area as well,

## 2018-11-30 LAB — DIC (DISSEMINATED INTRAVASCULAR COAGULATION)PANEL
D-Dimer, Quant: <0.27 ug/mL-FEU (ref 0.00–0.50)
Fibrinogen: 500 mg/dL — ABNORMAL HIGH (ref 210–475)
INR: 4.1 (ref 0.8–1.2)
Platelets: 87 10*3/uL — ABNORMAL LOW (ref 150–400)
Prothrombin Time: 38.8 seconds — ABNORMAL HIGH (ref 11.4–15.2)
Smear Review: NONE SEEN
aPTT: 44 seconds — ABNORMAL HIGH (ref 24–36)

## 2018-11-30 LAB — CBC WITH DIFFERENTIAL/PLATELET
Abs Immature Granulocytes: 0.01 10*3/uL (ref 0.00–0.07)
Basophils Absolute: 0 10*3/uL (ref 0.0–0.1)
Basophils Relative: 1 %
Eosinophils Absolute: 0.1 10*3/uL (ref 0.0–0.5)
Eosinophils Relative: 2 %
HCT: 36.6 % (ref 36.0–46.0)
Hemoglobin: 11.7 g/dL — ABNORMAL LOW (ref 12.0–15.0)
Immature Granulocytes: 0 %
Lymphocytes Relative: 28 %
Lymphs Abs: 1.1 10*3/uL (ref 0.7–4.0)
MCH: 27.4 pg (ref 26.0–34.0)
MCHC: 32 g/dL (ref 30.0–36.0)
MCV: 85.7 fL (ref 80.0–100.0)
Monocytes Absolute: 0.3 10*3/uL (ref 0.1–1.0)
Monocytes Relative: 8 %
Neutro Abs: 2.3 10*3/uL (ref 1.7–7.7)
Neutrophils Relative %: 61 %
Platelets: 87 10*3/uL — ABNORMAL LOW (ref 150–400)
RBC: 4.27 MIL/uL (ref 3.87–5.11)
RDW: 18 % — ABNORMAL HIGH (ref 11.5–15.5)
WBC: 3.8 10*3/uL — ABNORMAL LOW (ref 4.0–10.5)
nRBC: 0.8 % — ABNORMAL HIGH (ref 0.0–0.2)

## 2018-11-30 LAB — BASIC METABOLIC PANEL
Anion gap: 14 (ref 5–15)
BUN: 93 mg/dL — ABNORMAL HIGH (ref 6–20)
CO2: 20 mmol/L — ABNORMAL LOW (ref 22–32)
Calcium: 9.4 mg/dL (ref 8.9–10.3)
Chloride: 103 mmol/L (ref 98–111)
Creatinine, Ser: 3.62 mg/dL — ABNORMAL HIGH (ref 0.44–1.00)
GFR calc Af Amer: 15 mL/min — ABNORMAL LOW (ref >60)
GFR calc non Af Amer: 13 mL/min — ABNORMAL LOW (ref >60)
Glucose, Bld: 172 mg/dL — ABNORMAL HIGH (ref 70–99)
Potassium: 3.8 mmol/L (ref 3.5–5.1)
Sodium: 137 mmol/L (ref 135–145)

## 2018-11-30 LAB — CBG MONITORING, ED: Glucose-Capillary: 76 mg/dL (ref 70–99)

## 2018-11-30 LAB — PROTIME-INR
INR: 3.8 — ABNORMAL HIGH (ref 0.8–1.2)
Prothrombin Time: 36.9 seconds — ABNORMAL HIGH (ref 11.4–15.2)

## 2018-11-30 LAB — PATHOLOGIST SMEAR REVIEW

## 2018-11-30 MED ORDER — TRANEXAMIC ACID 1000 MG/10ML IV SOLN
500.0000 mg | Freq: Once | INTRAVENOUS | Status: AC
  Administered 2018-11-30: 500 mg via TOPICAL
  Filled 2018-11-30: qty 10

## 2018-11-30 NOTE — Discharge Instructions (Addendum)
Follow-up with your dentist as soon as possible.  Your INR is elevated today at 3.8.  Follow-up with your Coumadin clinic regarding the dosage of your Coumadin.  Your kidney function is slightly worse than it was a year ago.  You should call your nephrologist for an appointment and have this function follow closely.  Return to the ED if you develop new or worsening symptoms.   Follow up with dr. Delton Coombes for pancytopenia

## 2018-11-30 NOTE — ED Notes (Signed)
Pt states that she just wants to sleep. No v/s at this tim e

## 2018-11-30 NOTE — ED Provider Notes (Signed)
[facility] EMERGENCY DEPARTMENT Provider Note   CSN: [id number] Arrival date & time: 11/29/18  2332     History   Chief Complaint Chief Complaint  Patient presents with  . Dental Pain    HPI [name] is a 58 y.o. female.     Patient with a history of ischemic cardiomyopathy status post pacemaker, on Coumadin for chronic atrial fibrillation presenting with dental bleeding.  States she had a tooth pulled about 6 days ago by her dentist.  Her coumadin was not stopped for the procedure.  She did not have any bleeding initially but presented to the ED on October 2 when she had noticed some bleeding that was controlled with gauze packing.  She was reassured and given a tea bag for home use.  She states she has no further bleeding until tonight when she noticed some blood in the sink when she was spitting up.  She put the tea bag in her mouth and then came to the hospital.  She states she noticed some bleeding coming from the tooth extraction site and spit some blood into the sink but with there was no vomiting of blood.  No chest pain or shortness of breath.  No dizziness or lightheadedness.  She was prescribed Vicodin for pain but has been taking only Tylenol.  No difficulty breathing or difficulty swallowing.  The history is provided by the patient.  Dental Pain Associated symptoms: no fever and no headaches     Past Medical History:  Diagnosis Date  . Automatic implantable cardioverter-defibrillator in situ    a. 03/2013: BSX Energen CRTD BiV ICC, ser# [id number]  . Chronic anticoagulation    a. coumadin.  . Chronic atrial fibrillation (HCC)    embolic rind  . Chronic kidney disease    creatinine- 1.8 in 09/2006 and 2.0 in 05/2007; 2.05 in 2011, 2.29 in 2012  . Chronic systolic CHF (congestive heart failure) ([place])    a. 03/2013 Echo: Sev LV dysfxn with sev diff HK, restrictive phys, diast dysfxn, mild MR, sev dil LA/RA, mod PR, PASP 28mmHg.  . Congenital third degree heart block     a. Guidant VVI pacemaker implanted in 09/1997; b. gen change in 01/2004;  c. 03/2013 upgrade to [place], ser# [id number].  . Dizziness and giddiness 05/19/2012  . Dysphagia 08/14/2011   FEB 2013 EGD/DIL 16 MM; GERD; h/o gastroesophageal reflux disease; + H. Pylori gastritis   . GERD (gastroesophageal reflux disease)   . Helicobacter pylori gastritis 08/14/2011  . IDDM (insulin dependent diabetes mellitus)   . Kidney stones 1990's  . Nonischemic cardiomyopathy ([place])    a. 03/2013 Echo: Sev LV dysfxn with sev diff HK, restrictive phys, diast dysfxn, mild MR, sev dil LA/RA, mod PR, PASP 78mmHg;  b. 03/2013: BSX Energen CRTD BiV ICC, ser# [id number].  Marland Kitchen Potassium (K) excess   . Stroke [facility]) 1999   denies residual on 04/21/2013    Patient Active Problem List   Diagnosis Date Noted  . Helicobacter pylori gastritis 10/31/2016  . Colon adenomas 06/15/2015  . Biventricular ICD (implantable cardioverter-defibrillator) in place 05/21/2013  . Other and unspecified hyperlipidemia 04/11/2013  . Chronic systolic heart failure ([place]) 04/07/2013  . Encounter for therapeutic drug monitoring 03/29/2013  . Chronic kidney disease, stage IV (severe) ([place]) 01/27/2013  . Dyspepsia 09/10/2012  . Gout 05/15/2012  . Chronic anticoagulation   . Congenital third degree heart block   . Reversible ischemic neurological deficit ([place])   .  Cardiac pacemaker in situ 04/24/2009  . 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 CARDIOMYOPATHY 01/20/2008  . ATRIAL FIBRILLATION, CHRONIC 01/20/2008  . Cardiorenal syndrome/chronic kidney disease 01/20/2008    Past Surgical History:  Procedure Laterality Date  . BI-VENTRICULAR IMPLANTABLE CARDIOVERTER DEFIBRILLATOR  (CRT-D)  04/21/2013  . BI-VENTRICULAR IMPLANTABLE CARDIOVERTER DEFIBRILLATOR UPGRADE N/A 04/21/2013   Procedure: BI-VENTRICULAR IMPLANTABLE CARDIOVERTER DEFIBRILLATOR UPGRADE;  Surgeon: Evans Lance, MD;  Location: [facility] CATH LAB;  Service: Cardiovascular;  Laterality: N/A;  . COLONOSCOPY  04/23/2011   TIR:[id number] COLON POLYP/ INTERNAL HEMORRHOIDS/ TORTUOUS COLON  . INSERT / REPLACE / REMOVE PACEMAKER  09/1997   Guidant VVI [employer]  . PACEMAKER GENERATOR CHANGE  01/2004  . UPPER GASTROINTESTINAL ENDOSCOPY  FEB 2013   RING/DIL TO 16 MM, H PYLORI GASTRITIS     OB History    Gravida  4   Para      Term      Preterm      AB      Living  4     SAB      TAB      Ectopic      Multiple      Live Births               Home Medications    Prior to Admission medications   Medication Sig Start Date End Date Taking? Authorizing Provider  acetaminophen (TYLENOL) 500 MG tablet Take 500 mg by mouth every 6 (six) hours as needed for moderate pain.    [provider]  Alcohol Swabs (ALCOHOL PREP) PADS 1 each by Does not apply route daily. 02/11/18   Elayne Snare, MD  allopurinol (ZYLOPRIM) 100 MG tablet Take 150 mg by mouth every morning. Reported on 06/15/2015    [provider]  benazepril (LOTENSIN) 10 MG tablet Take 10 mg by mouth daily.    [provider]  COREG CR 40 MG 24 hr capsule TAKE ONE CAPSULE BY MOUTH DAILY. 11/24/17   Evans Lance, MD  digoxin (LANOXIN) 0.125 MG tablet TAKE 1 TABLET BY MOUTH DAILY EXCEPT ON SATURDAY AND SUNDAY. 11/27/18   Herminio Commons, MD  furosemide (LASIX) 80 MG tablet TAKE (1) TABLET BY MOUTH TWICE DAILY. 10/26/18   Herminio Commons, MD  insulin NPH Human (HUMULIN N) 100 UNIT/ML injection Inject 0.34 mLs (34 Units total) into the skin daily. 07/22/18   Elayne Snare, MD  Insulin Pen Needle 31G X 5 MM MISC 1 each by Does not apply route daily. Use as instructed to check blood sugar 4 times daily. 02/11/18   Elayne Snare, MD  Insulin Syringe-Needle U-100 30G 1 ML MISC 1 each by Does not apply route daily. Use needle to inject insulin 3 times daily. 02/13/18   Elayne Snare, MD  NOVOLOG FLEXPEN 100  UNIT/ML FlexPen INJECT SUBCUTANEOUSLY 12 UNITS BEFORE BREAKFAST; 14 UNITS AT LUNCH; AND 16 UNITS AT DINNER. 10/25/18   Elayne Snare, MD  TRESIBA FLEXTOUCH 200 UNIT/ML SOPN INJECT 56 UNITS INTO THE SKIN DAILY BEFORE BREAKFAST. 10/25/18   Elayne Snare, MD  VICTOZA 18 MG/3ML SOPN INJECT 1.2MG  SUBCUTANEOUSLY DAILY AS DIRECTED. 09/30/18   Elayne Snare, MD  warfarin (COUMADIN) 5 MG tablet TAKE 1/2 DAILY EXCEPT ON WEDNESDAY TAKE 1 TABLET. Patient taking differently: TAKE 1/2 DAILY. 06/30/18   Herminio Commons, MD    Family History Family History  Adopted: Yes  Problem Relation Age of Onset  .  Diabetes Daughter   . Colon cancer Neg Hx   . Colon polyps Neg Hx     Social History Social History   Tobacco Use  . Smoking status: Never Smoker  . Smokeless tobacco: Never Used  . Tobacco comment: Never smoked  Substance Use Topics  . Alcohol use: No    Alcohol/week: 0.0 standard drinks  . Drug use: No     Allergies   Wheat, Latex, Penicillins, and Sulfa antibiotics   Review of Systems Review of Systems  Constitutional: Negative for activity change, appetite change and fever.  HENT: Positive for dental problem.   Respiratory: Negative for cough and shortness of breath.   Cardiovascular: Negative for chest pain.  Gastrointestinal: Negative for abdominal pain, nausea and vomiting.  Genitourinary: Negative for dysuria and hematuria.  Musculoskeletal: Negative for arthralgias and myalgias.  Skin: Negative for rash.  Neurological: Negative for dizziness, weakness and headaches.    all other systems are negative except as noted in the HPI and PMH.    Physical Exam Updated Vital Signs BP 129/80   Pulse 61   Temp 98 F (36.7 C) (Oral)   Resp 16   Ht 5\' 6"  (1.676 m)   Wt 77.1 kg   SpO2 98%   BMI 27.44 kg/m   Physical Exam Vitals signs and nursing note reviewed.  Constitutional:      General: She is not in acute distress.    Appearance: She is well-developed.  HENT:     Head:  Normocephalic and atraumatic.     Mouth/Throat:     Pharynx: No oropharyngeal exudate.     Comments: Right lower molar dental extraction site appears clean.  There is a clot present.  Teabag was removed which showed ruptured and loose tea was swished from patient's mouth gently.  There is no ongoing bleeding from sites. Eyes:     Conjunctiva/sclera: Conjunctivae normal.     Pupils: Pupils are equal, round, and reactive to light.  Neck:     Musculoskeletal: Normal range of motion and neck supple.     Comments: No meningismus. Cardiovascular:     Rate and Rhythm: Normal rate and regular rhythm.     Heart sounds: Normal heart sounds. No murmur.  Pulmonary:     Effort: Pulmonary effort is normal. No respiratory distress.     Breath sounds: Normal breath sounds.  Abdominal:     Palpations: Abdomen is soft.     Tenderness: There is no abdominal tenderness. There is no guarding or rebound.  Musculoskeletal: Normal range of motion.        General: No tenderness.  Skin:    General: Skin is warm.     Capillary Refill: Capillary refill takes less than 2 seconds.  Neurological:     General: No focal deficit present.     Mental Status: She is alert and oriented to person, place, and time. Mental status is at baseline.     Cranial Nerves: No cranial nerve deficit.     Motor: No abnormal muscle tone.     Coordination: Coordination normal.     Comments: No ataxia on finger to nose bilaterally. No pronator drift. 5/5 strength throughout. CN 2-12 intact.Equal grip strength. Sensation intact.   Psychiatric:        Behavior: Behavior normal.      ED Treatments / Results  Labs (all labs ordered are listed, but only abnormal results are displayed) Labs Reviewed  CBC WITH DIFFERENTIAL/PLATELET - Abnormal; Notable for the  following components:      Result Value   WBC 3.8 (*)    Hemoglobin 11.7 (*)    RDW 18.0 (*)    Platelets 87 (*)    nRBC 0.8 (*)    All other components within normal  limits  BASIC METABOLIC PANEL - Abnormal; Notable for the following components:   CO2 20 (*)    Glucose, Bld 172 (*)    BUN 93 (*)    Creatinine, Ser 3.62 (*)    GFR calc non Af Amer 13 (*)    GFR calc Af Amer 15 (*)    All other components within normal limits  PROTIME-INR - Abnormal; Notable for the following components:   Prothrombin Time 36.9 (*)    INR 3.8 (*)    All other components within normal limits  PATHOLOGIST SMEAR REVIEW  DIC (DISSEMINATED INTRAVASCULAR COAGULATION) PANEL    EKG None  Radiology No results found.  Procedures Procedures (including critical care time)  Medications Ordered in ED Medications  tranexamic acid (CYKLOKAPRON) injection 500 mg (has no administration in time range)     Initial Impression / Assessment and Plan / ED Course  I have reviewed the triage vital signs and the nursing notes.  Pertinent labs & imaging results that were available during my care of the patient were reviewed by me and considered in my medical decision making (see chart for details).       Dental bleeding status post extraction.  Patient on Coumadin.  Vitals are stable.  No significant bleeding on initial exam.  CBC and INR will be checked given recurrent visits for bleeding.  Topical TXA placed on her extraction site.  Hemoglobin stable at 11.7.  INR high at 3.8.  Platelets 87. Creatinine slightly worse at 3.6 from 3.0.  Patient states she has a Coumadin clinic appointment at 9 AM.  Advised to follow-up with her dentist regarding the bleeding socket.  Advised to follow-up with her nephrologist as well regarding her worsening kidney function.  She is mildly thrombocytopenic today but not to the point that would cause excessive bleeding. ITP and TTP considered.  TTP seems unlikely given her lack of fever and well appearance. Does have worsening Cr but also underlying CKD.  Pages to Dr. Delton Coombes not returned. Discussed with Dr. Julien Nordmann of hematology at [facility]   He agrees platelets of 87 are unlikely to cause significant bleeding but there is some concern for ITP and he does recommend peripheral smear.  TTP also considered given worsening renal function but this seems less likely given patient's normal mental status, no fever underlying history of CKD.  Dr. Earlie Server recommends peripheral smear to evaluate for schistocytes.  Peripheral smear are still pending at shift change to rule out schistocytes.  Patient resting comfortably.  Advised follow-up with her dentist as well as with her nephrologist given her worsening kidney function. Return precautions discussed.  Dr. Roderic Palau to assume care at shift change.    Final Clinical Impressions(s) / ED Diagnoses   Final diagnoses:  Gingival bleeding  History of coagulopathy  Thrombocytopenia [facility])    ED Discharge Orders    None       Ezequiel Essex, MD 11/30/18 [phone]

## 2018-11-30 NOTE — ED Notes (Addendum)
CRITICAL VALUE ALERT  Critical Value:  INR 4.1  Date & Time Notied:  11/30/2018, 0724  Provider Notified: Dr. Wyvonnia Dusky  Orders Received/Actions taken: no new orders

## 2018-12-03 ENCOUNTER — Ambulatory Visit (INDEPENDENT_AMBULATORY_CARE_PROVIDER_SITE_OTHER): Payer: Medicaid Other | Admitting: *Deleted

## 2018-12-03 ENCOUNTER — Other Ambulatory Visit: Payer: Self-pay

## 2018-12-03 DIAGNOSIS — I4891 Unspecified atrial fibrillation: Secondary | ICD-10-CM

## 2018-12-03 DIAGNOSIS — Z5181 Encounter for therapeutic drug level monitoring: Secondary | ICD-10-CM

## 2018-12-03 LAB — POCT INR: INR: 2 (ref 2.0–3.0)

## 2018-12-03 NOTE — Patient Instructions (Signed)
Was in ED for pain and bleeding after dental extraction. INR was 4.1  Coumadin was held 2 days.  She restarted in last night Resume coumadin 1/2 tablet daily Continue greens as usual Recheck in 2 weeks Does not want to change to Eliquis

## 2018-12-21 ENCOUNTER — Other Ambulatory Visit: Payer: Self-pay | Admitting: Obstetrics & Gynecology

## 2018-12-21 DIAGNOSIS — Z1231 Encounter for screening mammogram for malignant neoplasm of breast: Secondary | ICD-10-CM

## 2019-01-06 ENCOUNTER — Other Ambulatory Visit: Payer: Self-pay | Admitting: Endocrinology

## 2019-01-11 ENCOUNTER — Ambulatory Visit (INDEPENDENT_AMBULATORY_CARE_PROVIDER_SITE_OTHER): Payer: Medicaid Other | Admitting: *Deleted

## 2019-01-11 DIAGNOSIS — Z5181 Encounter for therapeutic drug level monitoring: Secondary | ICD-10-CM

## 2019-01-11 DIAGNOSIS — I4891 Unspecified atrial fibrillation: Secondary | ICD-10-CM | POA: Diagnosis not present

## 2019-01-11 LAB — POCT INR: INR: 3.1 — AB (ref 2.0–3.0)

## 2019-01-11 NOTE — Patient Instructions (Signed)
Took coumadin today. Hold coumadin tomorrow then resume 1/2 tablet daily Continue greens as usual Recheck in 4 weeks Does not want to change to Eliquis

## 2019-01-13 ENCOUNTER — Encounter: Payer: Medicaid Other | Admitting: *Deleted

## 2019-01-14 ENCOUNTER — Other Ambulatory Visit: Payer: Self-pay

## 2019-01-14 ENCOUNTER — Encounter: Payer: Self-pay | Admitting: Gastroenterology

## 2019-01-14 ENCOUNTER — Ambulatory Visit (INDEPENDENT_AMBULATORY_CARE_PROVIDER_SITE_OTHER): Payer: Medicaid Other | Admitting: Gastroenterology

## 2019-01-14 ENCOUNTER — Encounter: Payer: Self-pay | Admitting: *Deleted

## 2019-01-14 DIAGNOSIS — K7460 Unspecified cirrhosis of liver: Secondary | ICD-10-CM | POA: Insufficient documentation

## 2019-01-14 DIAGNOSIS — K7469 Other cirrhosis of liver: Secondary | ICD-10-CM

## 2019-01-14 NOTE — Progress Notes (Signed)
ON RECALL  °

## 2019-01-14 NOTE — Progress Notes (Signed)
CC'ED TO PCP 

## 2019-01-14 NOTE — Assessment & Plan Note (Addendum)
ETIOLOGY UNCLEAR-WELL COMPENSATED DISEASE. LIKELY RELATED TO HEART FAILURE.  DIFFERENTIAL DIAGNOSIS INCLUDES: BURNED OUT NASH, LESS LIKELY AIH, PBC, OR HEMOCHROMATOSIS.  COMPLETE LABS AND ULTRASOUND. SEE HEMATOLOGY FOR LOW PLATELET COUNT. FOLLOW UP IN 6 MOS.

## 2019-01-14 NOTE — Patient Instructions (Signed)
COMPLETE LABS AND ULTRASOUND.  SEE HEMATOLOGY FOR LOW PLATELET COUNT.  FOLLOW UP IN 6 MOS.

## 2019-01-14 NOTE — Progress Notes (Signed)
Subjective:    Patient ID: [name], female    DOB: 1960/12/24, 58 y.o.   MRN: [id number] Toni Fire, MD  HPI PT CONCERNED ABOUT LOW PLATELET COUNT. HAD TOOTH EXTRACTED IN OCT 2020 (DR. TY WOOD) AND BLED HEAVILY AFTERWARDS. BMs: GOOD.   PT DENIES FEVER, CHILLS, HEMATOCHEZIA, HEMATEMESIS, nausea, vomiting, melena, diarrhea, CHEST PAIN, SHORTNESS OF BREATH, CHANGE IN BOWEL IN HABITS, constipation, abdominal pain, problems swallowing, OR heartburn or indigestion.  Past Medical History:  Diagnosis Date  . Automatic implantable cardioverter-defibrillator in situ    a. 03/2013: BSX Energen CRTD BiV ICC, ser# [id number]  . Chronic anticoagulation    a. coumadin.  . Chronic atrial fibrillation (HCC)    embolic rind  . Chronic kidney disease    creatinine- 1.8 in 09/2006 and 2.0 in 05/2007; 2.05 in 2011, 2.29 in 2012  . Chronic systolic CHF (congestive heart failure) ([place])    a. 03/2013 Echo: Sev LV dysfxn with sev diff HK, restrictive phys, diast dysfxn, mild MR, sev dil LA/RA, mod PR, PASP 19mmHg.  . Congenital third degree heart block    a. Guidant VVI pacemaker implanted in 09/1997; b. gen change in 01/2004;  c. 03/2013 upgrade to [place], ser# [id number].  . Dizziness and giddiness 05/19/2012  . Dysphagia 08/14/2011   FEB 2013 EGD/DIL 16 MM; GERD; h/o gastroesophageal reflux disease; + H. Pylori gastritis   . GERD (gastroesophageal reflux disease)   . Helicobacter pylori gastritis 08/14/2011  . IDDM (insulin dependent diabetes mellitus)   . Kidney stones 1990's  . Nonischemic cardiomyopathy ([place])    a. 03/2013 Echo: Sev LV dysfxn with sev diff HK, restrictive phys, diast dysfxn, mild MR, sev dil LA/RA, mod PR, PASP 2mmHg;  b. 03/2013: BSX Energen CRTD BiV ICC, ser# [id number].  Marland Kitchen Potassium (K) excess   . Stroke [facility]) 1999   denies residual on 04/21/2013   Past Surgical History:  Procedure Laterality Date  . BI-VENTRICULAR IMPLANTABLE CARDIOVERTER DEFIBRILLATOR  (CRT-D)   04/21/2013  . BI-VENTRICULAR IMPLANTABLE CARDIOVERTER DEFIBRILLATOR UPGRADE N/A 04/21/2013   Procedure: BI-VENTRICULAR IMPLANTABLE CARDIOVERTER DEFIBRILLATOR UPGRADE;  Surgeon: Evans Lance, MD;  Location: [facility] CATH LAB;  Service: Cardiovascular;  Laterality: N/A;  . COLONOSCOPY  04/23/2011   RAQ:[id number] COLON POLYP/ INTERNAL HEMORRHOIDS/ TORTUOUS COLON  . INSERT / REPLACE / REMOVE PACEMAKER  09/1997   Guidant VVI [employer]  . PACEMAKER GENERATOR CHANGE  01/2004  . UPPER GASTROINTESTINAL ENDOSCOPY  FEB 2013   RING/DIL TO 16 MM, H PYLORI GASTRITIS   Allergies  Allergen Reactions  . Wheat Swelling  . Latex Rash  . Penicillins Rash  . Sulfa Antibiotics Rash   Current Outpatient Medications  Medication Sig    . acetaminophen (TYLENOL) 500 MG tablet Take 500 mg by mouth every 6 (six) hours as needed for moderate pain.    . Alcohol Swabs (ALCOHOL PREP) PADS 1 each by Does not apply route daily.    Marland Kitchen allopurinol (ZYLOPRIM) 100 MG tablet Take 150 mg by mouth every morning. Reported on 06/15/2015    . benazepril (LOTENSIN) 10 MG tablet Take 10 mg by mouth daily.    . calcitRIOL (ROCALTROL) 0.25 MCG capsule Take 0.5 mcg by mouth daily.    . cholecalciferol (VITAMIN D3) 25 MCG (1000 UT) tablet Take 1,000 Units by mouth daily.    Marland Kitchen COREG CR 40 MG 24 hr capsule TAKE ONE CAPSULE BY MOUTH DAILY.    . furosemide (LASIX) 80 MG tablet TAKE (  1) TABLET BY MOUTH TWICE DAILY.    Marland Kitchen insulin NPH Human (HUMULIN N) 100 UNIT/ML injection Inject 0.34 mLs (34 Units total) into the skin daily.    .      .      . NOVOLOG FLEXPEN 100 UNIT/ML FlexPen INJECT SUBCUTANEOUSLY 12 UNITS BEFORE BREAKFAST; 14 UNITS AT LUNCH; AND 16 UNITS AT DINNER.    . TRESIBA FLEXTOUCH 200 UNIT/ML SOPN INJECT 56 UNITS INTO THE SKIN DAILY BEFORE BREAKFAST.    Marland Kitchen VICTOZA 18 MG/3ML SOPN INJECT 1.2MG  SUBCUTANEOUSLY DAILY AS DIRECTED.    Marland Kitchen warfarin (COUMADIN) 5 MG tablet TAKE 1/2 DAILY EXCEPT ON WEDNESDAY TAKE 1 TABLET. (Patient taking  differently: Take 2.5 mg by mouth daily. TAKE 1/2 DAILY.)    .       Review of Systems PER HPI OTHERWISE ALL SYSTEMS ARE NEGATIVE.    Objective:   Physical Exam Constitutional:      General: She is not in acute distress.    Appearance: Normal appearance.  HENT:     Mouth/Throat:     Comments: MASK IN PLACE Eyes:     General: No scleral icterus.    Pupils: Pupils are equal, round, and reactive to light.  Neck:     Musculoskeletal: Normal range of motion.  Cardiovascular:     Rate and Rhythm: Normal rate and regular rhythm.     Pulses: Normal pulses.     Heart sounds: Normal heart sounds.  Pulmonary:     Effort: Pulmonary effort is normal.     Breath sounds: Normal breath sounds.  Abdominal:     General: Bowel sounds are normal.     Palpations: Abdomen is soft.     Tenderness: There is no abdominal tenderness.  Musculoskeletal:     Right lower leg: No edema.     Left lower leg: No edema.  Lymphadenopathy:     Cervical: No cervical adenopathy.  Skin:    General: Skin is warm and dry.  Neurological:     Mental Status: She is alert and oriented to person, place, and time.     Comments: NO  NEW FOCAL DEFICITS  Psychiatric:        Mood and Affect: Mood normal.     Comments: NORMAL AFFECT       Assessment & Plan:

## 2019-01-17 LAB — CBC WITH DIFFERENTIAL/PLATELET
Absolute Monocytes: 405 cells/uL (ref 200–950)
Basophils Absolute: 28 cells/uL (ref 0–200)
Basophils Relative: 0.6 %
Eosinophils Absolute: 120 cells/uL (ref 15–500)
Eosinophils Relative: 2.6 %
HCT: 37.6 % (ref 35.0–45.0)
Hemoglobin: 12 g/dL (ref 11.7–15.5)
Lymphs Abs: 1030 cells/uL (ref 850–3900)
MCH: 27.1 pg (ref 27.0–33.0)
MCHC: 31.9 g/dL — ABNORMAL LOW (ref 32.0–36.0)
MCV: 84.9 fL (ref 80.0–100.0)
MPV: 13.1 fL — ABNORMAL HIGH (ref 7.5–12.5)
Monocytes Relative: 8.8 %
Neutro Abs: 3018 cells/uL (ref 1500–7800)
Neutrophils Relative %: 65.6 %
Platelets: 96 10*3/uL — ABNORMAL LOW (ref 140–400)
RBC: 4.43 10*6/uL (ref 3.80–5.10)
RDW: 16.9 % — ABNORMAL HIGH (ref 11.0–15.0)
Total Lymphocyte: 22.4 %
WBC: 4.6 10*3/uL (ref 3.8–10.8)

## 2019-01-17 LAB — HEPATITIS B SURFACE ANTIBODY,QUALITATIVE: Hep B S Ab: NONREACTIVE

## 2019-01-17 LAB — MITOCHONDRIAL ANTIBODIES: Mitochondrial M2 Ab, IgG: <=20 U

## 2019-01-17 LAB — HEPATITIS C ANTIBODY
Hepatitis C Ab: NONREACTIVE
SIGNAL TO CUT-OFF: 0.02 (ref <1.00)

## 2019-01-17 LAB — IGG, IGA, IGM
IgG (Immunoglobin G), Serum: 1855 mg/dL — ABNORMAL HIGH (ref 600–1640)
IgM, Serum: 55 mg/dL (ref 50–300)
Immunoglobulin A: 158 mg/dL (ref 47–310)

## 2019-01-17 LAB — FERRITIN: Ferritin: 177 ng/mL (ref 16–232)

## 2019-01-17 LAB — IRON, TOTAL/TOTAL IRON BINDING CAP
%SAT: 20 % (calc) (ref 16–45)
Iron: 63 ug/dL (ref 45–160)
TIBC: 321 mcg/dL (calc) (ref 250–450)

## 2019-01-17 LAB — HEPATITIS B SURFACE ANTIGEN: Hepatitis B Surface Ag: NONREACTIVE

## 2019-01-17 LAB — ANA: Anti Nuclear Antibody (ANA): NEGATIVE

## 2019-01-17 LAB — HEPATITIS A ANTIBODY, TOTAL: Hepatitis A AB,Total: REACTIVE — AB

## 2019-01-17 LAB — ANTI-SMOOTH MUSCLE ANTIBODY, IGG: Actin (Smooth Muscle) Antibody (IGG): <20 U (ref <20)

## 2019-01-18 ENCOUNTER — Other Ambulatory Visit: Payer: Self-pay

## 2019-01-18 ENCOUNTER — Other Ambulatory Visit (INDEPENDENT_AMBULATORY_CARE_PROVIDER_SITE_OTHER): Payer: Medicaid Other

## 2019-01-18 DIAGNOSIS — Z794 Long term (current) use of insulin: Secondary | ICD-10-CM

## 2019-01-18 DIAGNOSIS — E1165 Type 2 diabetes mellitus with hyperglycemia: Secondary | ICD-10-CM

## 2019-01-18 LAB — COMPREHENSIVE METABOLIC PANEL
ALT: 18 U/L (ref 0–35)
AST: 24 U/L (ref 0–37)
Albumin: 3.5 g/dL (ref 3.5–5.2)
Alkaline Phosphatase: 256 U/L — ABNORMAL HIGH (ref 39–117)
BUN: 85 mg/dL (ref 6–23)
CO2: 25 mEq/L (ref 19–32)
Calcium: 9.6 mg/dL (ref 8.4–10.5)
Chloride: 104 mEq/L (ref 96–112)
Creatinine, Ser: 3.1 mg/dL — ABNORMAL HIGH (ref 0.40–1.20)
GFR: 18.57 mL/min — ABNORMAL LOW (ref >60.00)
Glucose, Bld: 121 mg/dL — ABNORMAL HIGH (ref 70–99)
Potassium: 4.7 mEq/L (ref 3.5–5.1)
Sodium: 139 mEq/L (ref 135–145)
Total Bilirubin: 2.3 mg/dL — ABNORMAL HIGH (ref 0.2–1.2)
Total Protein: 7.1 g/dL (ref 6.0–8.3)

## 2019-01-18 LAB — HEMOGLOBIN A1C: Hgb A1c MFr Bld: 7.2 % — ABNORMAL HIGH (ref 4.6–6.5)

## 2019-01-19 ENCOUNTER — Telehealth: Payer: Self-pay | Admitting: Gastroenterology

## 2019-01-19 NOTE — Telephone Encounter (Signed)
See result note. I called again and got VM, left message to call.

## 2019-01-19 NOTE — Telephone Encounter (Signed)
Pt was returning DS call. Please call her at [phone]

## 2019-01-20 NOTE — Progress Notes (Signed)
Patient knows about getting Hepatitis B Vaccine and said she will arrange with her PCP

## 2019-01-20 NOTE — Progress Notes (Signed)
cc'd to pcp 

## 2019-01-20 NOTE — Progress Notes (Signed)
Patient is aware of results and know about abd u/s for 12/1

## 2019-01-25 ENCOUNTER — Other Ambulatory Visit: Payer: Self-pay

## 2019-01-25 ENCOUNTER — Encounter: Payer: Self-pay | Admitting: Endocrinology

## 2019-01-25 ENCOUNTER — Telehealth: Payer: Self-pay

## 2019-01-25 ENCOUNTER — Ambulatory Visit (INDEPENDENT_AMBULATORY_CARE_PROVIDER_SITE_OTHER): Payer: Medicaid Other | Admitting: Endocrinology

## 2019-01-25 DIAGNOSIS — E1165 Type 2 diabetes mellitus with hyperglycemia: Secondary | ICD-10-CM | POA: Diagnosis not present

## 2019-01-25 DIAGNOSIS — Z794 Long term (current) use of insulin: Secondary | ICD-10-CM | POA: Diagnosis not present

## 2019-01-25 DIAGNOSIS — N184 Chronic kidney disease, stage 4 (severe): Secondary | ICD-10-CM

## 2019-01-25 NOTE — Telephone Encounter (Signed)
Pt called again for results and was made aware (AGAIN). She is aware that she should get the Hep B vaccination and will try to get from PCP. I am leaving order at front if unable to get from PCP she may take to [facility] or one of the pharmacies to get it.

## 2019-01-25 NOTE — Progress Notes (Signed)
Patient ID: [name], female   DOB: 02-08-1961, 58 y.o.   MRN: [id number]   Reason for Appointment : Follow up for  Diabetes  History of Present Illness    Today's office visit was provided via telemedicine using video technique Explained to the patient and the the limitations of evaluation and management by telemedicine and the availability of in person appointments.  The patient understood the limitations and agreed to proceed. Patient also understood that the telehealth visit is billable. . Location of the patient: Home . Location of the provider: Office Only the patient and myself were participating in the encounter          Diagnosis: Type 2 diabetes mellitus, date of diagnosis: 1990        Past history: She has been on insulin since 2007. Generally has had poor control in requiring large doses of insulin. Blood sugars also have been previously variable making her control difficult. She has had difficulty following instructions for mealtime insulin consistently in the past  Previously had been on a multiple injection regimen of Lantus twice a day, NovoLog a.c. and NPH at bedtime A1c has been as high as 11.3 previously   Recent history: INSULIN regimen is: [place] 46,  Humulin N 25 at bedtime, NovoLog a.c.12- 14-16 before meals  Her A1c is somewhat better at 7.2, was as low as 7% earlier this year Fructosamine previously was high at 369   Glucose patterns , current management and problems identified:  She has had various other problems including dental surgery affecting her food intake  Because of variable food intake she is sometimes drinking more liquids and juices and milk causing her sugars to be high  However appears to be having high readings after dinner mostly and most of this is related to not taking her NovoLog before eating  She may have had some low sugars early morning when she was eating less but only blood sugars from the last week are  available on the phone  She is not sure how much insulin she is taking for the NPH and [place]  She thinks she is adjusting the doses based on her sugar at night but has only a few readings after supper  As before she is checking blood sugars mostly in the mornings and then sporadically later in the day  Currently does not know what normal blood sugar ranges and targets are  She is assuming that blood sugars in the 80s are low and may eat more because of this  She is starting to eat more protein at breakfast as discussed on the last visit with adding oatmeal or [place] bacon  Not able to do much physical activity because of various other problems  Glucose monitoring:  done  1-2 times a day         Glucometer:  Accu-Chek [employer]        Blood Glucose readings from meter review done by patient   PRE-MEAL Fasting Lunch Dinner  2-4 AM Overall  Glucose range: 80-138  123, 170  103  92-189   Mean/median:     ?   POST-MEAL PC Breakfast PC Lunch PC Dinner  Glucose range:   95  87-313  Mean/median:      Previous readings:  PRE-MEAL Fasting Lunch Dinner Bedtime/overnight Overall  Glucose range:  59-156   80-189  65-193   Mean/median:      139   POST-MEAL PC Breakfast PC Lunch PC Dinner  Glucose range:  83, 89   85-197  Mean/median:        Self-care: The diet that the patient has been following is low fat,  Lunch : Sometimes salad otherwise sandwiches  Meals: 3 meals per day.  breakfast is 9 am at lunch 12 pm Supper at about 6-7 PM, mealtimes are variable           Dietician visit: Most recent:.Several years ago  Last CDE consult in 10/2017          Wt Readings from Last 3 Encounters:  01/14/19 180 lb 3.2 oz (81.7 kg)  11/29/18 170 lb (77.1 kg)  11/27/18 170 lb (77.1 kg)   A1c results  Lab Results  Component Value Date   HGBA1C 7.2 (H) 01/18/2019   HGBA1C 7.8 (H) 09/23/2018   HGBA1C 7.4 (A) 06/16/2018   Lab Results  Component Value Date   MICROALBUR 6.3 (H) 09/23/2018    LDLCALC 59 09/23/2018   CREATININE 3.10 (H) 01/18/2019   Lab Results  Component Value Date   FRUCTOSAMINE 369 (H) 09/23/2018   FRUCTOSAMINE 398 (H) 09/22/2015   FRUCTOSAMINE 344 (H) 01/27/2013      Allergies as of 01/25/2019      Reactions   Wheat Swelling   Latex Rash   Penicillins Rash   Sulfa Antibiotics Rash      Medication List       Accurate as of January 25, 2019 12:50 PM. If you have any questions, ask your nurse or doctor.        acetaminophen 500 MG tablet Commonly known as: TYLENOL Take 500 mg by mouth every 6 (six) hours as needed for moderate pain.   Alcohol Prep Pads 1 each by Does not apply route daily.   allopurinol 100 MG tablet Commonly known as: ZYLOPRIM Take 150 mg by mouth every morning. Reported on 06/15/2015   benazepril 10 MG tablet Commonly known as: LOTENSIN Take 10 mg by mouth daily.   calcitRIOL 0.25 MCG capsule Commonly known as: ROCALTROL Take 0.5 mcg by mouth daily. Take 2 tablets by mouth once daily.   cholecalciferol 25 MCG (1000 UT) tablet Commonly known as: VITAMIN D3 Take 1,000 Units by mouth daily.   Coreg CR 40 MG 24 hr capsule Generic drug: carvedilol TAKE ONE CAPSULE BY MOUTH DAILY.   digoxin 0.125 MG tablet Commonly known as: LANOXIN TAKE 1 TABLET BY MOUTH DAILY EXCEPT ON SATURDAY AND SUNDAY.   furosemide 80 MG tablet Commonly known as: LASIX TAKE (1) TABLET BY MOUTH TWICE DAILY.   insulin NPH Human 100 UNIT/ML injection Commonly known as: HumuLIN N Inject 0.34 mLs (34 Units total) into the skin daily.   Insulin Pen Needle 31G X 5 MM Misc 1 each by Does not apply route daily. Use as instructed to check blood sugar 4 times daily.   Insulin Syringe-Needle U-100 30G 1 ML Misc 1 each by Does not apply route daily. Use needle to inject insulin 3 times daily.   NovoLOG FlexPen 100 UNIT/ML FlexPen Generic drug: insulin aspart INJECT SUBCUTANEOUSLY 12 UNITS BEFORE BREAKFAST; 14 UNITS AT LUNCH; AND 16 UNITS AT  DINNER.   Tyler Aas FlexTouch 200 UNIT/ML Sopn Generic drug: Insulin Degludec INJECT 56 UNITS INTO THE SKIN DAILY BEFORE BREAKFAST.   Victoza 18 MG/3ML Sopn Generic drug: liraglutide INJECT 1.2MG  SUBCUTANEOUSLY DAILY AS DIRECTED.   warfarin 5 MG tablet Commonly known as: COUMADIN Take as directed by the anticoagulation clinic. If you are unsure how to take this medication,  talk to your nurse or doctor. Original instructions: TAKE 1/2 DAILY EXCEPT ON WEDNESDAY TAKE 1 TABLET. What changed: See the new instructions.       Allergies:  Allergies  Allergen Reactions  . Wheat Swelling  . Latex Rash  . Penicillins Rash  . Sulfa Antibiotics Rash    Past Medical History:  Diagnosis Date  . Automatic implantable cardioverter-defibrillator in situ    a. 03/2013: BSX Energen CRTD BiV ICC, ser# [id number]  . Chronic anticoagulation    a. coumadin.  . Chronic atrial fibrillation (HCC)    embolic rind  . Chronic kidney disease    creatinine- 1.8 in 09/2006 and 2.0 in 05/2007; 2.05 in 2011, 2.29 in 2012  . Chronic systolic CHF (congestive heart failure) ([place])    a. 03/2013 Echo: Sev LV dysfxn with sev diff HK, restrictive phys, diast dysfxn, mild MR, sev dil LA/RA, mod PR, PASP 55mmHg.  . Congenital third degree heart block    a. Guidant VVI pacemaker implanted in 09/1997; b. gen change in 01/2004;  c. 03/2013 upgrade to [place], ser# [id number].  . Dizziness and giddiness 05/19/2012  . Dysphagia 08/14/2011   FEB 2013 EGD/DIL 16 MM; GERD; h/o gastroesophageal reflux disease; + H. Pylori gastritis   . GERD (gastroesophageal reflux disease)   . Helicobacter pylori gastritis 08/14/2011  . IDDM (insulin dependent diabetes mellitus)   . Kidney stones 1990's  . Nonischemic cardiomyopathy ([place])    a. 03/2013 Echo: Sev LV dysfxn with sev diff HK, restrictive phys, diast dysfxn, mild MR, sev dil LA/RA, mod PR, PASP 71mmHg;  b. 03/2013: BSX Energen CRTD BiV ICC, ser# [id number].  Marland Kitchen Potassium (K)  excess   . Stroke [facility]) 1999   denies residual on 04/21/2013    Past Surgical History:  Procedure Laterality Date  . BI-VENTRICULAR IMPLANTABLE CARDIOVERTER DEFIBRILLATOR  (CRT-D)  04/21/2013  . BI-VENTRICULAR IMPLANTABLE CARDIOVERTER DEFIBRILLATOR UPGRADE N/A 04/21/2013   Procedure: BI-VENTRICULAR IMPLANTABLE CARDIOVERTER DEFIBRILLATOR UPGRADE;  Surgeon: Evans Lance, MD;  Location[facility] C[facility] LAB;  Service: Cardiovascular;  Laterality: N/A;  . COLONOSCOPY  04/23/2011   PPI:RJJOACZYSA COLON POLYP/ INTERNAL HEMORRHOIDS/ TORTUOUS COLON  . INSERT / REPLACE / REMOVE PACEMAKER  09/1997   Guidant VVI [employer]  . PACEMAKER GENERATOR CHANGE  01/2004  . UPPER GASTROINTESTINAL ENDOSCOPY  FEB 2013   RING/DIL TO 16 MM, H PYLORI GASTRITIS    Family History  Adopted: Yes  Problem Relation Age of Onset  . Diabetes Daughter   . Colon cancer Neg Hx   . Colon polyps Neg Hx     Social History:  reports that she has never smoked. She has never used smokeless tobacco. She reports that she does not drink alcohol or use drugs.    Review of Systems:   She has had chronic renal insufficiency, followed by nephrologist  No history of microalbuminuria   Lab Results  Component Value Date   CREATININE 3.10 (H) 01/18/2019   BUN 85 (HH) 01/18/2019   NA 139 01/18/2019   K 4.7 01/18/2019   CL 104 01/18/2019   CO2 25 01/18/2019    She has had cardiomyopathy and History of atrial fibrillation followed by cardiologist  Lipids: Has dyslipidemia with low HDL, high triglycerides Not on statins, followed by PCP   Lab Results  Component Value Date   CHOL 109 09/23/2018   HDL 30.10 (L) 09/23/2018   LDLCALC 59 09/23/2018   LDLDIRECT 65.0 09/22/2015   TRIG 101.0 09/23/2018  CHOLHDL 4 09/23/2018    Foot exam in 01/2018 normal except absent pedal pulses on the right No symptoms of neuropathy recently  She has cirrhosis of unclear etiology  Physical Examination:  There were no vitals taken  for this visit.   ASSESSMENT:  Diabetes type 1:  See history of present illness for discussion of her current blood sugar patterns, treatment regimen, meal planning and problems identified  Her A1c has been reasonably good and now 7.2  Not clear if her A1c is accurate because of her renal dysfunction  Her blood sugars are looking fairly good especially fasting but she is not checking her readings after meals She will occasionally forget insulin to cover her evening meal Also likely is getting excessively high doses of insulin at times to cover her requirements overnight with low normal or occasionally low sugars Still likely benefiting somewhat from Victoza   History of retinopathy: Will need to get reports from last eye exam  PLAN:    Emphasized the need to check blood sugars more regularly at various times and not just in the morning  Discussed blood sugar targets and normal ranges  Reduce NPH to 22 units instead of 25 at bedtime and only adjust based on fasting readings and not bedtime readings  No change in [place] unless blood sugars are low waking up  Adjust dose of NovoLog 2 to 4 units before meals of her down based on portions of carbohydrates  Avoid drinking excessive juices  Make sure she takes her NovoLog before starting to eat  Resume walking for exercise regularly as she is apparently tending to gain weight  Follow-up in 3 months and bring blood sugar meter for download Consider freestyle libre if checking 4 times a day regularly  There are no Patient Instructions on file for this visit.   Counseling time on subjects discussed in assessment and plan sections is over 50% of today's 25 minute visit      Elayne Snare 01/25/2019, 12:50 PM

## 2019-01-25 NOTE — Telephone Encounter (Signed)
REVIEWED-NO ADDITIONAL RECOMMENDATIONS. 

## 2019-01-26 ENCOUNTER — Ambulatory Visit (HOSPITAL_COMMUNITY)
Admission: RE | Admit: 2019-01-26 | Discharge: 2019-01-26 | Disposition: A | Discharge status: Home / Self Care | Payer: Medicaid Other | Source: Ambulatory Visit | Attending: Gastroenterology | Admitting: Gastroenterology

## 2019-01-26 ENCOUNTER — Other Ambulatory Visit: Payer: Self-pay

## 2019-01-26 DIAGNOSIS — K7469 Other cirrhosis of liver: Secondary | ICD-10-CM | POA: Diagnosis present

## 2019-01-26 NOTE — Progress Notes (Signed)
cc'd to pcp 

## 2019-01-27 ENCOUNTER — Other Ambulatory Visit: Payer: Self-pay | Admitting: Endocrinology

## 2019-01-29 ENCOUNTER — Telehealth: Payer: Self-pay | Admitting: Cardiovascular Disease

## 2019-01-29 NOTE — Telephone Encounter (Signed)
Patient called asking for a letter for her insurance company to state whether she does have CHF or not.   She is trying to get life insurance.  Please call patient.Marland KitchenMarland KitchenMarland Kitchen

## 2019-01-29 NOTE — Telephone Encounter (Signed)
Patient is going to have insurance company fax a request for release of information.

## 2019-02-01 ENCOUNTER — Telehealth: Payer: Self-pay | Admitting: Gastroenterology

## 2019-02-01 DIAGNOSIS — D696 Thrombocytopenia, unspecified: Secondary | ICD-10-CM

## 2019-02-01 NOTE — Telephone Encounter (Signed)
Pt is  Aware [place] was normal.  She is concerned about her low platelets and the fact that she is having swelling daily in her lower extremities.   She has pain from her thighs down to her feet and legs and has to stay off of her feel most of the time.  She was asking about her husband having colonoscopy and is aware of his appointment with the triage nurse.   Dr. Oneida Alar, please advise!

## 2019-02-01 NOTE — Telephone Encounter (Signed)
PLEASE CALL PT. SHE SHOULD SEE HEMATOLOGY REGARDING THE LOW PLATELETS. SHE SHOULD DR Dwyane Dee OR KONESWARAN REGARDING SWELLING IN HER LEGS.

## 2019-02-01 NOTE — Telephone Encounter (Signed)
Pt was calling to see if her [place] results were back. She also said that we gave her paperwork but it has a wrong number on it and a man called her to say he keeps getting calls for [facility] office and the number to the paper work was wrong. [phone]

## 2019-02-01 NOTE — Telephone Encounter (Signed)
REVIEWED. Refer pt to Hematology for thrombocytopenia.PLEASE CALL PT and let her know the referral has been made.

## 2019-02-01 NOTE — Telephone Encounter (Signed)
Pt said she has never seen hematology and will need referral.  She is aware to see Dr. Dwyane Dee or Dr. Jacinta Shoe about the swelling in her legs.  She said she just talked to someone at Dr. Josephine Cables office and they are giving her some medication for her swelling.  She will see how that does first before she calls Dr. Dwyane Dee or Dr.Koneswaren.

## 2019-02-02 ENCOUNTER — Encounter (HOSPITAL_COMMUNITY): Payer: Self-pay

## 2019-02-02 ENCOUNTER — Other Ambulatory Visit: Payer: Self-pay

## 2019-02-02 NOTE — Telephone Encounter (Signed)
Referrals sent

## 2019-02-02 NOTE — Addendum Note (Signed)
Addended by: Cheron Every on: 02/02/2019 09:13 AM   Modules accepted: Orders

## 2019-02-02 NOTE — Telephone Encounter (Signed)
Pt is aware and has been scheduled for tomorrow.

## 2019-02-03 ENCOUNTER — Encounter (HOSPITAL_COMMUNITY): Payer: Self-pay | Admitting: Hematology

## 2019-02-03 ENCOUNTER — Inpatient Hospital Stay (HOSPITAL_COMMUNITY): Payer: Medicaid Other

## 2019-02-03 ENCOUNTER — Inpatient Hospital Stay (HOSPITAL_COMMUNITY): Payer: Medicaid Other | Attending: Hematology | Admitting: Hematology

## 2019-02-03 VITALS — BP 103/66 | HR 64 | Temp 96.9°F | Resp 18 | Ht 68.0 in | Wt 182.2 lb

## 2019-02-03 DIAGNOSIS — D696 Thrombocytopenia, unspecified: Secondary | ICD-10-CM | POA: Diagnosis present

## 2019-02-03 DIAGNOSIS — R161 Splenomegaly, not elsewhere classified: Secondary | ICD-10-CM

## 2019-02-03 DIAGNOSIS — K7460 Unspecified cirrhosis of liver: Secondary | ICD-10-CM | POA: Insufficient documentation

## 2019-02-03 LAB — CBC WITH DIFFERENTIAL/PLATELET
Abs Immature Granulocytes: 0.02 10*3/uL (ref 0.00–0.07)
Basophils Absolute: 0 10*3/uL (ref 0.0–0.1)
Basophils Relative: 1 %
Eosinophils Absolute: 0.2 10*3/uL (ref 0.0–0.5)
Eosinophils Relative: 3 %
HCT: 38.4 % (ref 36.0–46.0)
Hemoglobin: 12.1 g/dL (ref 12.0–15.0)
Immature Granulocytes: 0 %
Lymphocytes Relative: 18 %
Lymphs Abs: 0.9 10*3/uL (ref 0.7–4.0)
MCH: 27.6 pg (ref 26.0–34.0)
MCHC: 31.5 g/dL (ref 30.0–36.0)
MCV: 87.5 fL (ref 80.0–100.0)
Monocytes Absolute: 0.4 10*3/uL (ref 0.1–1.0)
Monocytes Relative: 7 %
Neutro Abs: 3.6 10*3/uL (ref 1.7–7.7)
Neutrophils Relative %: 71 %
Platelets: 95 10*3/uL — ABNORMAL LOW (ref 150–400)
RBC: 4.39 MIL/uL (ref 3.87–5.11)
RDW: 17.1 % — ABNORMAL HIGH (ref 11.5–15.5)
WBC: 5.2 10*3/uL (ref 4.0–10.5)
nRBC: 0.4 % — ABNORMAL HIGH (ref 0.0–0.2)

## 2019-02-03 LAB — LACTATE DEHYDROGENASE: LDH: 163 U/L (ref 98–192)

## 2019-02-03 LAB — IRON AND TIBC
Iron: 54 ug/dL (ref 28–170)
Saturation Ratios: 14 % (ref 10.4–31.8)
TIBC: 386 ug/dL (ref 250–450)
UIBC: 332 ug/dL

## 2019-02-03 LAB — SEDIMENTATION RATE: Sed Rate: 35 mm/hr — ABNORMAL HIGH (ref 0–22)

## 2019-02-03 LAB — HEPATITIS B CORE ANTIBODY, TOTAL: Hep B Core Total Ab: NONREACTIVE

## 2019-02-03 LAB — FOLATE: Folate: 14 ng/mL (ref >5.9)

## 2019-02-03 LAB — VITAMIN B12: Vitamin B-12: 1044 pg/mL — ABNORMAL HIGH (ref 180–914)

## 2019-02-03 LAB — FERRITIN: Ferritin: 182 ng/mL (ref 11–307)

## 2019-02-03 NOTE — Patient Instructions (Signed)
[place] at [facility] Discharge Instructions  You were seen today by Dr. Delton Coombes. He went over your history, family history and how you've been feeling lately. He will have blood drawn today. He will see you back in 4 weeks for follow up.   Thank you for choosing [place] at [facility] to provide your oncology and hematology care.  To afford each patient quality time with our provider, please arrive at least 15 minutes before your scheduled appointment time.   If you have a lab appointment with the [facility] please come in thru the  Main Entrance and check in at the main information desk  You need to re-schedule your appointment should you arrive 10 or more minutes late.  We strive to give you quality time with our providers, and arriving late affects you and other patients whose appointments are after yours.  Also, if you no show three or more times for appointments you may be dismissed from the clinic at the providers discretion.     Again, thank you for choosing [facility].  Our hope is that these requests will decrease the amount of time that you wait before being seen by our physicians.       _____________________________________________________________  Should you have questions after your visit to [facility], please contact our office at [phone] between the hours of 8:00 a.m. and 4:30 p.m.  Voicemails left after 4:00 p.m. will not be returned until the following business day.  For prescription refill requests, have your pharmacy contact our office and allow 72 hours.    Cancer Center Support Programs:   > Cancer Support Group  2nd Tuesday of the month 1pm-2pm, Journey Room

## 2019-02-03 NOTE — Progress Notes (Signed)
CONSULT NOTE  Patient Care Team: [name], MD as PCP - General Herminio Commons, MD as PCP - Cardiology (Cardiology) Evans Lance, MD as PCP - Electrophysiology (Cardiology) Elayne Snare, MD (Endocrinology) Fran Lowes, MD (Nephrology) Jonnie Kind, MD (Obstetrics and Gynecology) Danie Binder, MD (Gastroenterology) Evans Lance, MD (Cardiology) Ruby Cola, MD as Consulting Physician (Otolaryngology)  CHIEF COMPLAINTS/PURPOSE OF CONSULTATION:  Mild to moderate thrombocytopenia.  HISTORY OF PRESENTING ILLNESS:  [name] 58 y.o. female is seen in consultation today at the request of Dr. Oneida Alar for further evaluation and management of mild to moderate thrombocytopenia.  Most recent CBC on 11 92,020 shows platelet count of 96.  I have reviewed her EMR over the last several years.  Platelet count was 136 on 03/03/2017, dropped to 87 on 11/30/2018.  She denies tonic water or using quinine supplements.  She does not consume walnuts excessively.  No other herbal supplements noted.  Denies any nosebleeds or hematuria.  Reportedly had more than expected bleeding when she had a tooth extraction recently.  However she reports that she has been on Coumadin.  She is on Coumadin since 1989 for atrial fibrillation.  Denies any fevers, night sweats or weight loss.  She was diagnosed with well compensated cirrhosis.  Last CT of the abdomen and pelvis on 03/03/2017 showed cirrhotic appearing liver and normal spleen.  She underwent ultrasound elastography of the liver on 01/26/2019.  Liver showed no focal lesions with normal parenchymal echogenicity.  Portal vein was patent.  Reports appetite level of 100% and energy level of 75%.  She reports chronic soreness in her upper thighs.  She has leg swelling which is manageable.  She lives at home with her husband and does all her ADLs and IADLs.  She has been on disability since 1999.  She was never smoker.  Family history did not reveal  any bleeding problems.  No malignancies reported in her family.   MEDICAL HISTORY:  Past Medical History:  Diagnosis Date  . Automatic implantable cardioverter-defibrillator in situ    a. 03/2013: BSX Energen CRTD BiV ICC, ser# [id number]  . Chronic anticoagulation    a. coumadin.  . Chronic atrial fibrillation (HCC)    embolic rind  . Chronic kidney disease    creatinine- 1.8 in 09/2006 and 2.0 in 05/2007; 2.05 in 2011, 2.29 in 2012  . Chronic systolic CHF (congestive heart failure) ([place])    a. 03/2013 Echo: Sev LV dysfxn with sev diff HK, restrictive phys, diast dysfxn, mild MR, sev dil LA/RA, mod PR, PASP 58mmHg.  . Congenital third degree heart block    a. Guidant VVI pacemaker implanted in 09/1997; b. gen change in 01/2004;  c. 03/2013 upgrade to [place], ser# [id number].  . Dizziness and giddiness 05/19/2012  . Dysphagia 08/14/2011   FEB 2013 EGD/DIL 16 MM; GERD; h/o gastroesophageal reflux disease; + H. Pylori gastritis   . GERD (gastroesophageal reflux disease)   . Helicobacter pylori gastritis 08/14/2011  . IDDM (insulin dependent diabetes mellitus)   . Kidney stones 1990's  . Nonischemic cardiomyopathy ([place])    a. 03/2013 Echo: Sev LV dysfxn with sev diff HK, restrictive phys, diast dysfxn, mild MR, sev dil LA/RA, mod PR, PASP 12mmHg;  b. 03/2013: BSX Energen CRTD BiV ICC, ser# [id number].  Marland Kitchen Potassium (K) excess   . Stroke [facility]) 1999   denies residual on 04/21/2013    SURGICAL HISTORY: Past Surgical History:  Procedure Laterality Date  .  BI-VENTRICULAR IMPLANTABLE CARDIOVERTER DEFIBRILLATOR  (CRT-D)  04/21/2013  . BI-VENTRICULAR IMPLANTABLE CARDIOVERTER DEFIBRILLATOR UPGRADE N/A 04/21/2013   Procedure: BI-VENTRICULAR IMPLANTABLE CARDIOVERTER DEFIBRILLATOR UPGRADE;  Surgeon: Evans Lance, MD;  Location: [facility] CATH LAB;  Service: Cardiovascular;  Laterality: N/A;  . COLONOSCOPY  04/23/2011   MGQ:[id number] COLON POLYP/ INTERNAL HEMORRHOIDS/ TORTUOUS COLON  . INSERT / REPLACE /  REMOVE PACEMAKER  09/1997   Guidant VVI [employer]  . PACEMAKER GENERATOR CHANGE  01/2004  . UPPER GASTROINTESTINAL ENDOSCOPY  FEB 2013   RING/DIL TO 16 MM, H PYLORI GASTRITIS    SOCIAL HISTORY: Social History   Socioeconomic History  . Marital status: Married    Spouse name: Not on file  . Number of children: 4  . Years of education: Not on file  . Highest education level: Not on file  Occupational History    Employer: UNEMPLOYED  Tobacco Use  . Smoking status: Never Smoker  . Smokeless tobacco: Never Used  . Tobacco comment: Never smoked  Substance and Sexual Activity  . Alcohol use: No    Alcohol/week: 0.0 standard drinks  . Drug use: No  . Sexual activity: Not Currently    Birth control/protection: Post-menopausal  Other Topics Concern  . Not on file  Social History Narrative  . Not on file   Social Determinants of Health   Financial Resource Strain:   . Difficulty of Paying Living Expenses: Not on file  Food Insecurity:   . Worried About Charity fundraiser in the Last Year: Not on file  . Ran Out of Food in the Last Year: Not on file  Transportation Needs:   . Lack of Transportation (Medical): Not on file  . Lack of Transportation (Non-Medical): Not on file  Physical Activity:   . Days of Exercise per Week: Not on file  . Minutes of Exercise per Session: Not on file  Stress:   . Feeling of Stress : Not on file  Social Connections:   . Frequency of Communication with Friends and Family: Not on file  . Frequency of Social Gatherings with Friends and Family: Not on file  . Attends Religious Services: Not on file  . Active Member of Clubs or Organizations: Not on file  . Attends Archivist Meetings: Not on file  . Marital Status: Not on file  Intimate Partner Violence:   . Fear of Current or Ex-Partner: Not on file  . Emotionally Abused: Not on file  . Physically Abused: Not on file  . Sexually Abused: Not on file    FAMILY  HISTORY: Family History  Adopted: Yes  Problem Relation Age of Onset  . Autism Son   . Seizures Son   . Diabetes Daughter   . Colon cancer Neg Hx   . Colon polyps Neg Hx     ALLERGIES:  is allergic to wheat; latex; penicillins; and sulfa antibiotics.  MEDICATIONS:  Current Outpatient Medications  Medication Sig Dispense Refill  . acetaminophen (TYLENOL) 500 MG tablet Take 500 mg by mouth every 6 (six) hours as needed for moderate pain.    . Alcohol Swabs (ALCOHOL PREP) PADS 1 each by Does not apply route daily. 1000 each 3  . allopurinol (ZYLOPRIM) 100 MG tablet Take 150 mg by mouth every morning. Reported on 06/15/2015    . benazepril (LOTENSIN) 10 MG tablet Take 10 mg by mouth daily.    . calcitRIOL (ROCALTROL) 0.25 MCG capsule Take 0.5 mcg by mouth daily. Take 2 tablets by  mouth once daily.    . cholecalciferol (VITAMIN D3) 25 MCG (1000 UT) tablet Take 1,000 Units by mouth daily.    Marland Kitchen COREG CR 40 MG 24 hr capsule TAKE ONE CAPSULE BY MOUTH DAILY. 30 capsule 6  . digoxin (LANOXIN) 0.125 MG tablet TAKE 1 TABLET BY MOUTH DAILY EXCEPT ON SATURDAY AND SUNDAY. 20 tablet 6  . furosemide (LASIX) 80 MG tablet TAKE (1) TABLET BY MOUTH TWICE DAILY. 60 tablet 6  . insulin NPH Human (HUMULIN N) 100 UNIT/ML injection Inject 0.34 mLs (34 Units total) into the skin daily. (Patient taking differently: Inject 22 Units into the skin daily. ) 20 mL 3  . Insulin Pen Needle 31G X 5 MM MISC 1 each by Does not apply route daily. Use as instructed to check blood sugar 4 times daily. 200 each 3  . Insulin Syringe-Needle U-100 30G 1 ML MISC 1 each by Does not apply route daily. Use needle to inject insulin 3 times daily. 250 each 3  . LITETOUCH PEN NEEDLES 31G X 8 MM MISC USE AS DIRECTED UP TO 4 TIMES DAILY. 200 each 0  . metolazone (ZAROXOLYN) 2.5 MG tablet Take 2.5 mg by mouth daily as needed.    Marland Kitchen NOVOLOG FLEXPEN 100 UNIT/ML FlexPen INJECT SUBCUTANEOUSLY 12 UNITS BEFORE BREAKFAST; 14 UNITS AT LUNCH; AND 16  UNITS AT DINNER. 15 mL 0  . SURE COMFORT INSULIN SYRINGE 31G X 5/16" 0.5 ML MISC INJECT ONCE DAILY AS DIRECTED. 100 each 0  . TRESIBA FLEXTOUCH 200 UNIT/ML SOPN INJECT 56 UNITS INTO THE SKIN DAILY BEFORE BREAKFAST. 9 mL 0  . VICTOZA 18 MG/3ML SOPN INJECT 1.2MG  SUBCUTANEOUSLY DAILY AS DIRECTED. 6 mL 0  . warfarin (COUMADIN) 5 MG tablet TAKE 1/2 DAILY EXCEPT ON WEDNESDAY TAKE 1 TABLET. (Patient taking differently: Take 2.5 mg by mouth daily. TAKE 1/2 DAILY.) 20 tablet 6   No current facility-administered medications for this visit.    REVIEW OF SYSTEMS:   Constitutional: Denies fevers, chills or abnormal night sweats Eyes: Denies blurriness of vision, double vision or watery eyes Ears, nose, mouth, throat, and face: Denies mucositis or sore throat Respiratory: Denies cough, dyspnea or wheezes Cardiovascular: Denies palpitation, chest discomfort.  Positive for leg swelling. Gastrointestinal:  Denies nausea, heartburn or change in bowel habits Skin: Denies abnormal skin rashes Lymphatics: Denies new lymphadenopathy or easy bruising Neurological:Denies numbness, tingling or new weaknesses Behavioral/Psych: Mood is stable, no new changes  All other systems were reviewed with the patient and are negative.  PHYSICAL EXAMINATION: ECOG PERFORMANCE STATUS: 1 - Symptomatic but completely ambulatory  Vitals:   02/03/19 1034  BP: 103/66  Pulse: 64  Resp: 18  Temp: (!) 96.9 F (36.1 C)  SpO2: 100%   Filed Weights   02/03/19 1034  Weight: 182 lb 3.2 oz (82.6 kg)    GENERAL:alert, no distress and comfortable SKIN: skin color, texture, turgor are normal, no rashes or significant lesions EYES: normal, conjunctiva are pink and non-injected, sclera clear OROPHARYNX:no exudate, no erythema and lips, buccal mucosa, and tongue normal  NECK: supple, thyroid normal size, non-tender, without nodularity LYMPH:  no palpable lymphadenopathy in the cervical, axillary or inguinal LUNGS: clear to  auscultation and percussion with normal breathing effort HEART: regular rate & rhythm and no murmurs.  Trace edema bilaterally. ABDOMEN:abdomen soft, non-tender and normal bowel sounds.  No palpable splenomegaly. Musculoskeletal:no cyanosis of digits and no clubbing  PSYCH: alert & oriented x 3 with fluent speech NEURO: no focal motor/sensory deficits  LABORATORY DATA:  I have reviewed the data as listed Recent Results (from the past 2160 hour(s))  CBC with Differential/Platelet     Status: Abnormal   Collection Time: 11/30/18 12:14 AM  Result Value Ref Range   WBC 3.8 (L) 4.0 - 10.5 K/uL   RBC 4.27 3.87 - 5.11 MIL/uL   Hemoglobin 11.7 (L) 12.0 - 15.0 g/dL   HCT 36.6 36.0 - 46.0 %   MCV 85.7 80.0 - 100.0 fL   MCH 27.4 26.0 - 34.0 pg   MCHC 32.0 30.0 - 36.0 g/dL   RDW 18.0 (H) 11.5 - 15.5 %   Platelets 87 (L) 150 - 400 K/uL    Comment: SPECIMEN CHECKED FOR CLOTS   nRBC 0.8 (H) 0.0 - 0.2 %   Neutrophils Relative % 61 %   Neutro Abs 2.3 1.7 - 7.7 K/uL   Lymphocytes Relative 28 %   Lymphs Abs 1.1 0.7 - 4.0 K/uL   Monocytes Relative 8 %   Monocytes Absolute 0.3 0.1 - 1.0 K/uL   Eosinophils Relative 2 %   Eosinophils Absolute 0.1 0.0 - 0.5 K/uL   Basophils Relative 1 %   Basophils Absolute 0.0 0.0 - 0.1 K/uL   Immature Granulocytes 0 %   Abs Immature Granulocytes 0.01 0.00 - 0.07 K/uL    Comment: Performed at [facility], [address], [place], [place] [address]  Basic metabolic panel     Status: Abnormal   Collection Time: 11/30/18 12:14 AM  Result Value Ref Range   Sodium 137 135 - 145 mmol/L   Potassium 3.8 3.5 - 5.1 mmol/L   Chloride 103 98 - 111 mmol/L   CO2 20 (L) 22 - 32 mmol/L   Glucose, Bld 172 (H) 70 - 99 mg/dL   BUN 93 (H) 6 - 20 mg/dL   Creatinine, Ser 3.62 (H) 0.44 - 1.00 mg/dL   Calcium 9.4 8.9 - 10.3 mg/dL   GFR calc non Af Amer 13 (L) >60 mL/min   GFR calc Af Amer 15 (L) >60 mL/min   Anion gap 14 5 - 15    Comment: Performed at [facility], [address] [place], [place] [address]  Protime-INR     Status: Abnormal   Collection Time: 11/30/18 12:14 AM  Result Value Ref Range   Prothrombin Time 36.9 (H) 11.4 - 15.2 seconds   INR 3.8 (H) 0.8 - 1.2    Comment: (NOTE) INR goal varies based on device and disease states. Performed at [facility], [address], [place], [place] [address]   Pathologist smear review     Status: None   Collection Time: 11/30/18 12:14 AM  Result Value Ref Range   Path Review Reviewed By Violet Baldy, M.D.     Comment: 10.5.20 PANCYTOPENIA Performed at [facility], [place] [address], [place], [place] [address]   DIC panel     Status: Abnormal   Collection Time: 11/30/18  6:10 AM  Result Value Ref Range   Prothrombin Time 38.8 (H) 11.4 - 15.2 seconds   INR 4.1 (HH) 0.8 - 1.2    Comment: RESULT REPEATED AND VERIFIED CRITICAL RESULT CALLED TO, READ BACK BY AND VERIFIED WITH: MINTER,R AT 0724 BY HUFFINES,S ON 11/30/18.    aPTT 44 (H) 24 - 36 seconds    Comment:        IF BASELINE aPTT IS ELEVATED, SUGGEST PATIENT RISK ASSESSMENT BE USED TO DETERMINE APPROPRIATE ANTICOAGULANT THERAPY.    Fibrinogen 500 (  H) 210 - 475 mg/dL   D-Dimer, Quant <0.27 0.00 - 0.50 ug/mL-FEU    Comment: (NOTE) At the manufacturer cut-off of 0.50 ug/mL FEU, this assay has been documented to exclude PE with a sensitivity and negative predictive value of 97 to 99%.  At this time, this assay has not been approved by the FDA to exclude DVT/VTE. Results should be correlated with clinical presentation.    Platelets 87 (L) 150 - 400 K/uL    Comment: PLATELET COUNT CONFIRMED BY SMEAR SPECIMEN CHECKED FOR CLOTS CORRECTED ON 10/05 AT 6734: PREVIOUSLY REPORTED AS 87 SPECIMEN CHECKED FOR CLOTS Immature Platelet Fraction may be clinically indicated, consider ordering this additional test [id number] CONSISTENT WITH PREVIOUS RESULT    Smear Review NO SCHISTOCYTES SEEN     Comment: Performed at [facility], [address], [place],  [address]  CBG monitoring, ED     Status: None   Collection Time: 11/30/18 11:07 AM  Result Value Ref Range   Glucose-Capillary 76 70 - 99 mg/dL  POCT INR     Status: Normal   Collection Time: 12/03/18 12:19 PM  Result Value Ref Range   INR 2.0 2.0 - 3.0  POCT INR     Status: Abnormal   Collection Time: 01/11/19  2:02 PM  Result Value Ref Range   INR 3.1 (A) 2.0 - 3.0  CBC w/Diff     Status: Abnormal   Collection Time: 01/14/19 11:51 AM  Result Value Ref Range   WBC 4.6 3.8 - 10.8 Thousand/uL   RBC 4.43 3.80 - 5.10 Million/uL   Hemoglobin 12.0 11.7 - 15.5 g/dL   HCT 37.6 35.0 - 45.0 %   MCV 84.9 80.0 - 100.0 fL   MCH 27.1 27.0 - 33.0 pg   MCHC 31.9 (L) 32.0 - 36.0 g/dL   RDW 16.9 (H) 11.0 - 15.0 %   Platelets 96 (L) 140 - 400 Thousand/uL   MPV 13.1 (H) 7.5 - 12.5 fL   Neutro Abs 3,018 1,500 - 7,800 cells/uL   Lymphs Abs 1,030 850 - 3,900 cells/uL   Absolute Monocytes 405 200 - 950 cells/uL   Eosinophils Absolute 120 15 - 500 cells/uL   Basophils Absolute 28 0 - 200 cells/uL   Neutrophils Relative % 65.6 %   Total Lymphocyte 22.4 %   Monocytes Relative 8.8 %   Eosinophils Relative 2.6 %   Basophils Relative 0.6 %  Hepatitis C Antibody     Status: None   Collection Time: 01/14/19 11:51 AM  Result Value Ref Range   Hepatitis C Ab NON-REACTIVE NON-REACTI   SIGNAL TO CUT-OFF 0.02 <1.00    Comment: . HCV antibody was non-reactive. There is no laboratory  evidence of HCV infection. . In most cases, no further action is required. However, if recent HCV exposure is suspected, a test for HCV RNA (test code [id number]) is suggested. . For additional information please refer to http://education.questdiagnostics.com/faq/FAQ22v1 (This link is being provided for informational/ educational purposes only.) .   Hepatitis A Antibody, Total     Status: Abnormal   Collection Time: 01/14/19 11:51 AM  Result Value Ref Range   Hepatitis A AB,Total REACTIVE (A)  NON-REACTI    Comment: . For additional information, please refer to  http://education.questdiagn[facility].com/faq/FAQ202  (This link is being provided for informational/ educational purposes only.) .   Hepatitis B surface antigen     Status: None   Collection Time: 01/14/19 11:51 AM  Result Value Ref Range   Hepatitis  B Surface Ag NON-REACTIVE NON-REACTI  Hepatitis B surface antibody     Status: None   Collection Time: 01/14/19 11:51 AM  Result Value Ref Range   Hep B S Ab NON-REACTIVE NON-REACTI  ANA     Status: None   Collection Time: 01/14/19 11:51 AM  Result Value Ref Range   Anti Nuclear Antibody (ANA) NEGATIVE NEGATIVE    Comment: ANA IFA is a first line screen for detecting the presence of up to approximately 150 autoantibodies in various autoimmune diseases. A negative ANA IFA result suggests an ANA-associated autoimmune disease is not present at this time, but is not definitive. If there is high clinical suspicion for Sjogren's syndrome, testing for anti-SS-A/Ro antibody should be considered. Anti-Jo-1 antibody should be considered for clinically suspected inflammatory myopathies. . AC-0: Negative . International Consensus on ANA Patterns (https://www.hernandez-brewer.com/) . For additional information, please refer to http://education.[facility].com/faq/FAQ177 (This link is being provided for informational/ educational purposes only.) .   Anti-smooth muscle antibody, IgG     Status: None   Collection Time: 01/14/19 11:51 AM  Result Value Ref Range   Actin (Smooth Muscle) Antibody (IGG) <20 <20 U    Comment: . Reference Range:    <20 U: Negative >or=20 U: Positive . Marland Kitchen Antibodies recognizing actin are the main component of smooth muscle antibodies associated with auto- immune liver disease. Actin antibodies are found in approximately 75% of patients with autoimmune hepatitis (AIH) type 1, approximately 65% of patients with autoimmune  cholangitis, approximately 30% of patients with primary biliary cirrhosis and approximately 2% of healthy controls. High values are closely correlated with AIH type 1. .   IgG, IgA, IgM     Status: Abnormal   Collection Time: 01/14/19 11:51 AM  Result Value Ref Range   Immunoglobulin A 158 47 - 310 mg/dL   IgG (Immunoglobin G), Serum 1,855 (H) 600 - 1,640 mg/dL   IgM, Serum 55 50 - 300 mg/dL  Ferritin     Status: None   Collection Time: 01/14/19 11:51 AM  Result Value Ref Range   Ferritin 177 16 - 232 ng/mL  Mitochondrial antibodies     Status: None   Collection Time: 01/14/19 11:51 AM  Result Value Ref Range   Mitochondrial M2 Ab, IgG < OR = 20.0 U    Comment:                 Reference Range                 Negative:  < or = 20.0                 Equivocal: 20.1 - 24.9                 Positive:  > or = 25.0   Iron,Total/Total Iron Binding Cap     Status: None   Collection Time: 01/14/19 11:51 AM  Result Value Ref Range   Iron 63 45 - 160 mcg/dL   TIBC 321 250 - 450 mcg/dL (calc)   %SAT 20 16 - 45 % (calc)  Comprehensive metabolic panel     Status: Abnormal   Collection Time: 01/18/19 12:02 PM  Result Value Ref Range   Sodium 139 135 - 145 mEq/L   Potassium 4.7 3.5 - 5.1 mEq/L   Chloride 104 96 - 112 mEq/L   CO2 25 19 - 32 mEq/L   Glucose, Bld 121 (H) 70 - 99 mg/dL   BUN 85 (HH) 6 -  23 mg/dL   Creatinine, Ser 3.10 (H) 0.40 - 1.20 mg/dL   Total Bilirubin 2.3 (H) 0.2 - 1.2 mg/dL   Alkaline Phosphatase 256 (H) 39 - 117 U/L   AST 24 0 - 37 U/L   ALT 18 0 - 35 U/L   Total Protein 7.1 6.0 - 8.3 g/dL   Albumin 3.5 3.5 - 5.2 g/dL   GFR 18.57 (L) >60.00 mL/min   Calcium 9.6 8.4 - 10.5 mg/dL  Hemoglobin A1c     Status: Abnormal   Collection Time: 01/18/19 12:02 PM  Result Value Ref Range   Hgb A1c MFr Bld 7.2 (H) 4.6 - 6.5 %    Comment: Glycemic Control Guidelines for People with Diabetes:Non Diabetic:  <6%Goal of Therapy: <7%Additional Action Suggested:  >8%   CBC with  Differential/Platelet     Status: Abnormal   Collection Time: 02/03/19 12:01 PM  Result Value Ref Range   WBC 5.2 4.0 - 10.5 K/uL   RBC 4.39 3.87 - 5.11 MIL/uL   Hemoglobin 12.1 12.0 - 15.0 g/dL   HCT 38.4 36.0 - 46.0 %   MCV 87.5 80.0 - 100.0 fL   MCH 27.6 26.0 - 34.0 pg   MCHC 31.5 30.0 - 36.0 g/dL   RDW 17.1 (H) 11.5 - 15.5 %   Platelets 95 (L) 150 - 400 K/uL    Comment: PLATELET COUNT CONFIRMED BY SMEAR SPECIMEN CHECKED FOR CLOTS Immature Platelet Fraction may be clinically indicated, consider ordering this additional test [id number]    nRBC 0.4 (H) 0.0 - 0.2 %   Neutrophils Relative % 71 %   Neutro Abs 3.6 1.7 - 7.7 K/uL   Lymphocytes Relative 18 %   Lymphs Abs 0.9 0.7 - 4.0 K/uL   Monocytes Relative 7 %   Monocytes Absolute 0.4 0.1 - 1.0 K/uL   Eosinophils Relative 3 %   Eosinophils Absolute 0.2 0.0 - 0.5 K/uL   Basophils Relative 1 %   Basophils Absolute 0.0 0.0 - 0.1 K/uL   Immature Granulocytes 0 %   Abs Immature Granulocytes 0.02 0.00 - 0.07 K/uL   Schistocytes PRESENT     Comment: FEW   Target Cells PRESENT     Comment: FEW Performed at [facility], [address], [place], [place] [address]   Lactate dehydrogenase     Status: None   Collection Time: 02/03/19 12:01 PM  Result Value Ref Range   LDH 163 98 - 192 U/L    Comment: Performed at [facility], [address], [place], [place] [address]  Sedimentation rate     Status: Abnormal   Collection Time: 02/03/19 12:01 PM  Result Value Ref Range   Sed Rate 35 (H) 0 - 22 mm/hr    Comment: Performed at [facility], [address], [place], [place] [address]  Iron and TIBC     Status: None   Collection Time: 02/03/19 12:01 PM  Result Value Ref Range   Iron 54 28 - 170 ug/dL   TIBC 386 250 - 450 ug/dL   Saturation Ratios 14 10.4 - 31.8 %   UIBC 332 ug/dL    Comment: Performed at [facility], [address] [place], [place] [address]  Ferritin     Status: None   Collection Time: 02/03/19 12:01 PM  Result  Value Ref Range   Ferritin 182 11 - 307 ng/mL    Comment: Performed at [facility], [address], [place],  [address]  Vitamin B12  Status: Abnormal   Collection Time: 02/03/19 12:01 PM  Result Value Ref Range   Vitamin B-12 1,044 (H) 180 - 914 pg/mL    Comment: (NOTE) This assay is not validated for testing neonatal or myeloproliferative syndrome specimens for Vitamin B12 levels. Performed at [facility], [address] [place], [place] [address]   Folate     Status: None   Collection Time: 02/03/19 12:01 PM  Result Value Ref Range   Folate 14.0 >5.9 ng/mL    Comment: Performed at [facility], [address], [place], [place] [address]  Hepatitis B core antibody, total     Status: None   Collection Time: 02/03/19 12:01 PM  Result Value Ref Range   Hep B Core Total Ab NON REACTIVE NON REACTIVE    Comment: Performed at [place] [address], [place], [place] [address]    RADIOGRAPHIC STUDIES: I have personally reviewed the radiological images as listed and agreed with the findings in the report. [place] ELASTOGRAPHY LIVER  Result Date: 01/26/2019 CLINICAL DATA:  Cirrhosis. EXAM: US LIVER ELASTOGRAPHY TECHNIQUE: Sonography of the liver was performed. In addition, ultrasound elastography evaluation of the liver was performed. A region of interest was placed within the right lobe of the liver. Following application of a compressive sonographic pulse, tissue compressibility was assessed. Multiple assessments were performed at the selected site. Median tissue compressibility was determined. Previously, hepatic stiffness was assessed by shear wave velocity. Based on recently published Society of Radiologists in Ultrasound consensus article, reporting is now recommended to be performed in the SI units of pressure (kiloPascals) representing hepatic stiffness/elasticity. The obtained result is compared to the published reference standards. (cACLD= compensated Advanced Chronic  Liver Disease) COMPARISON:  Right upper quadrant ultrasound on 03/03/2017 FINDINGS: Liver: No focal lesion identified. Within normal limits in parenchymal echogenicity. Portal vein is patent on color Doppler imaging with normal direction of blood flow towards the liver. ULTRASOUND HEPATIC ELASTOGRAPHY Device: Siemens Helix VTQ Patient position: Oblique Transducer: 5C1 Number of measurements: 10 Hepatic segment:  8 Median kPa: 33.9 IQR: 3.9 IQR/Median kPa ratio: 0.1 Data quality:  Good Diagnostic category:  >13 kPa: highly suggestive of cACLD IMPRESSION: ULTRASOUND LIVER: Unremarkable sonographic appearance of liver. ULTRASOUND HEPATIC ELASTOGRAPHY: Median kPa:  33.9 Diagnostic category:  >13 kPa: highly suggestive of cACLD The use of hepatic elastography is applicable to patients with viral hepatitis and non-alcoholic fatty liver disease. At this time, there is insufficient data for the referenced cut-off values and use in other causes of liver disease, including alcoholic liver disease. Patients, however, may be assessed by elastography and serve as their own reference standard/baseline. In patients with non-alcoholic liver disease, the values suggesting compensated advanced chronic liver disease (cACLD) may be lower, and patients may need additional testing with elasticity results of 7-9 kPa. Please note that abnormal hepatic elasticity and shear wave velocities may also be identified in clinical settings other than with hepatic fibrosis, such as: acute hepatitis, elevated right heart and central venous pressures including use of beta blockers, veno-occlusive disease (Budd-Chiari), infiltrative processes such as mastocytosis/amyloidosis/infiltrative tumor/lymphoma, extrahepatic cholestasis, with hyperemia in the post-prandial state, and with liver transplantation. Correlation with patient history, laboratory data, and clinical condition recommended. Electronically Signed   By: Marlaine Hind M.D.   On: 01/26/2019  10:01    ASSESSMENT & PLAN:  Thrombocytopenia ([place]) 1.  Moderate thrombocytopenia: -She was evaluated for mild to moderate thrombocytopenia since 2019. -Platelet count 136 on 03/03/2017, 87 on 11/30/2018, and 96  on 01/14/2019. -She does not report any easy bruising.  However had excessive bleeding after recent tooth extraction.  She is also on Coumadin since 1999 for atrial fibrillation.  Apparently her Coumadin was not stopped prior to her extraction. -Denies any nosebleeds, hematuria or bleeding per rectum. -She is diagnosed with well compensated cirrhosis, etiology not clear at this time.  Differential included burned-out NASH, congestive heart failure among others. -Differential diagnosis of moderate thrombocytopenia includes liver disease, splenomegaly and immune mediated destruction. -We will repeat her CBC, review her smear, rule out nutritional deficiencies.  We will also check for infectious etiology.  We will screen her for connective tissue disorders. -I have recommended doing an ultrasound of the spleen to evaluate size and volume. -We will see her back in 3 to 4 weeks to discuss results.     All questions were answered. The patient knows to call the clinic with any problems, questions or concerns.      Derek Jack, MD 02/04/19 8:08 AM

## 2019-02-04 ENCOUNTER — Encounter (HOSPITAL_COMMUNITY): Payer: Self-pay | Admitting: Hematology

## 2019-02-04 DIAGNOSIS — D696 Thrombocytopenia, unspecified: Secondary | ICD-10-CM | POA: Insufficient documentation

## 2019-02-04 LAB — PROTEIN ELECTROPHORESIS, SERUM
A/G Ratio: 0.9 (ref 0.7–1.7)
Albumin ELP: 3.6 g/dL (ref 2.9–4.4)
Alpha-1-Globulin: 0.3 g/dL (ref 0.0–0.4)
Alpha-2-Globulin: 0.6 g/dL (ref 0.4–1.0)
Beta Globulin: 0.9 g/dL (ref 0.7–1.3)
Gamma Globulin: 2 g/dL — ABNORMAL HIGH (ref 0.4–1.8)
Globulin, Total: 3.8 g/dL (ref 2.2–3.9)
M-Spike, %: 1.3 g/dL — ABNORMAL HIGH
Total Protein ELP: 7.4 g/dL (ref 6.0–8.5)

## 2019-02-04 LAB — PATHOLOGIST SMEAR REVIEW

## 2019-02-04 LAB — RHEUMATOID FACTOR: Rheumatoid fact SerPl-aCnc: <10 IU/mL (ref 0.0–13.9)

## 2019-02-04 NOTE — Assessment & Plan Note (Signed)
1.  Moderate thrombocytopenia: -She was evaluated for mild to moderate thrombocytopenia since 2019. -Platelet count 136 on 03/03/2017, 87 on 11/30/2018, and 96 on 01/14/2019. -She does not report any easy bruising.  However had excessive bleeding after recent tooth extraction.  She is also on Coumadin since 1999 for atrial fibrillation.  Apparently her Coumadin was not stopped prior to her extraction. -Denies any nosebleeds, hematuria or bleeding per rectum. -She is diagnosed with well compensated cirrhosis, etiology not clear at this time.  Differential included burned-out NASH, congestive heart failure among others. -Differential diagnosis of moderate thrombocytopenia includes liver disease, splenomegaly and immune mediated destruction. -We will repeat her CBC, review her smear, rule out nutritional deficiencies.  We will also check for infectious etiology.  We will screen her for connective tissue disorders. -I have recommended doing an ultrasound of the spleen to evaluate size and volume. -We will see her back in 3 to 4 weeks to discuss results.

## 2019-02-06 LAB — METHYLMALONIC ACID, SERUM: Methylmalonic Acid, Quantitative: 406 nmol/L — ABNORMAL HIGH (ref 0–378)

## 2019-02-06 LAB — ANTINUCLEAR ANTIBODIES, IFA: ANA Ab, IFA: NEGATIVE

## 2019-02-07 LAB — COPPER, SERUM: Copper: 194 ug/dL — ABNORMAL HIGH (ref 72–166)

## 2019-02-08 ENCOUNTER — Ambulatory Visit (INDEPENDENT_AMBULATORY_CARE_PROVIDER_SITE_OTHER): Payer: Medicaid Other | Admitting: *Deleted

## 2019-02-08 ENCOUNTER — Other Ambulatory Visit: Payer: Self-pay

## 2019-02-08 DIAGNOSIS — I4891 Unspecified atrial fibrillation: Secondary | ICD-10-CM | POA: Diagnosis not present

## 2019-02-08 DIAGNOSIS — Z5181 Encounter for therapeutic drug level monitoring: Secondary | ICD-10-CM | POA: Diagnosis not present

## 2019-02-08 LAB — POCT INR: INR: 3.2 — AB (ref 2.0–3.0)

## 2019-02-08 NOTE — Patient Instructions (Signed)
Decrease warfarin to 1/2 tablet daily except none on Mondays Continue greens as usual Recheck in 3 weeks Does not want to change to Eliquis

## 2019-02-10 ENCOUNTER — Telehealth: Payer: Self-pay

## 2019-02-10 ENCOUNTER — Ambulatory Visit: Payer: Medicaid Other

## 2019-02-10 ENCOUNTER — Other Ambulatory Visit: Payer: Self-pay

## 2019-02-10 ENCOUNTER — Emergency Department (HOSPITAL_COMMUNITY): Payer: Medicaid Other

## 2019-02-10 ENCOUNTER — Telehealth: Payer: Self-pay | Admitting: Cardiovascular Disease

## 2019-02-10 ENCOUNTER — Emergency Department (HOSPITAL_COMMUNITY)
Admission: EM | Admit: 2019-02-10 | Discharge: 2019-02-10 | Disposition: A | Discharge status: Home / Self Care | Payer: Medicaid Other | Attending: Emergency Medicine | Admitting: Emergency Medicine

## 2019-02-10 ENCOUNTER — Encounter (HOSPITAL_COMMUNITY): Payer: Self-pay | Admitting: Emergency Medicine

## 2019-02-10 ENCOUNTER — Ambulatory Visit (HOSPITAL_COMMUNITY)
Admission: RE | Admit: 2019-02-10 | Discharge: 2019-02-10 | Disposition: A | Discharge status: Home / Self Care | Payer: Medicaid Other | Source: Ambulatory Visit | Attending: Hematology | Admitting: Hematology

## 2019-02-10 DIAGNOSIS — Z8673 Personal history of transient ischemic attack (TIA), and cerebral infarction without residual deficits: Secondary | ICD-10-CM | POA: Diagnosis not present

## 2019-02-10 DIAGNOSIS — M79605 Pain in left leg: Secondary | ICD-10-CM | POA: Insufficient documentation

## 2019-02-10 DIAGNOSIS — Z91040 Latex allergy status: Secondary | ICD-10-CM | POA: Insufficient documentation

## 2019-02-10 DIAGNOSIS — Z794 Long term (current) use of insulin: Secondary | ICD-10-CM | POA: Diagnosis not present

## 2019-02-10 DIAGNOSIS — Z7901 Long term (current) use of anticoagulants: Secondary | ICD-10-CM | POA: Insufficient documentation

## 2019-02-10 DIAGNOSIS — I5022 Chronic systolic (congestive) heart failure: Secondary | ICD-10-CM | POA: Insufficient documentation

## 2019-02-10 DIAGNOSIS — E1122 Type 2 diabetes mellitus with diabetic chronic kidney disease: Secondary | ICD-10-CM | POA: Insufficient documentation

## 2019-02-10 DIAGNOSIS — R161 Splenomegaly, not elsewhere classified: Secondary | ICD-10-CM | POA: Diagnosis present

## 2019-02-10 DIAGNOSIS — N184 Chronic kidney disease, stage 4 (severe): Secondary | ICD-10-CM | POA: Insufficient documentation

## 2019-02-10 DIAGNOSIS — I130 Hypertensive heart and chronic kidney disease with heart failure and stage 1 through stage 4 chronic kidney disease, or unspecified chronic kidney disease: Secondary | ICD-10-CM | POA: Diagnosis not present

## 2019-02-10 DIAGNOSIS — Z950 Presence of cardiac pacemaker: Secondary | ICD-10-CM | POA: Insufficient documentation

## 2019-02-10 DIAGNOSIS — Z79899 Other long term (current) drug therapy: Secondary | ICD-10-CM | POA: Diagnosis not present

## 2019-02-10 DIAGNOSIS — M79604 Pain in right leg: Secondary | ICD-10-CM | POA: Insufficient documentation

## 2019-02-10 NOTE — ED Provider Notes (Signed)
[name] Provider Note   CSN: [id number] Arrival date & time: 02/10/19  1201     History Chief Complaint  Patient presents with  . Leg Pain    64 [name] is a 58 y.o. female with a history of CHF, CKD, prior stroke, chronic afib, chronic anticoagulation on warfarin, IDDM, & cirrhosis who presents to the ED with complaints of bilateral leg pain for the past 2 weeks. Patient states she has an aching type pain to the bilateral upper leg/thigh region that is constant, worse with certain positions, no alleviating factors. No change with ambulation. Tried tylenol without much relief. No specific traumatic injury which she can recall. She states she was decorating the house and doing some squatting/position changes within the past few weeks. Denies leg swelling, numbness, tingling, weakness, erythema, warmth, incontinence, back pain, fever, chills, nausea, or vomiting.   HPI     Past Medical History:  Diagnosis Date  . Automatic implantable cardioverter-defibrillator in situ    a. 03/2013: BSX Energen CRTD BiV ICC, ser# [id number]  . Chronic anticoagulation    a. coumadin.  . Chronic atrial fibrillation (HCC)    embolic rind  . Chronic kidney disease    creatinine- 1.8 in 09/2006 and 2.0 in 05/2007; 2.05 in 2011, 2.29 in 2012  . Chronic systolic CHF (congestive heart failure) ([place])    a. 03/2013 Echo: Sev LV dysfxn with sev diff HK, restrictive phys, diast dysfxn, mild MR, sev dil LA/RA, mod PR, PASP 2mmHg.  . Congenital third degree heart block    a. Guidant VVI pacemaker implanted in 09/1997; b. gen change in 01/2004;  c. 03/2013 upgrade to [place], ser# [id number].  . Dizziness and giddiness 05/19/2012  . Dysphagia 08/14/2011   FEB 2013 EGD/DIL 16 MM; GERD; h/o gastroesophageal reflux disease; + H. Pylori gastritis   . GERD (gastroesophageal reflux disease)   . Helicobacter pylori gastritis 08/14/2011  . IDDM (insulin dependent diabetes mellitus)   .  Kidney stones 1990's  . Nonischemic cardiomyopathy ([place])    a. 03/2013 Echo: Sev LV dysfxn with sev diff HK, restrictive phys, diast dysfxn, mild MR, sev dil LA/RA, mod PR, PASP 34mmHg;  b. 03/2013: BSX Energen CRTD BiV ICC, ser# [id number].  Marland Kitchen Potassium (K) excess   . Stroke [facility]) 1999   denies residual on 04/21/2013    Patient Active Problem List   Diagnosis Date Noted  . Thrombocytopenia ([place]) 02/04/2019  . Hepatic cirrhosis ([place]) 01/14/2019  . Helicobacter pylori gastritis 10/31/2016  . Colon adenomas 06/15/2015  . Biventricular ICD (implantable cardioverter-defibrillator) in place 05/21/2013  . Other and unspecified hyperlipidemia 04/11/2013  . Chronic systolic heart failure ([place]) 04/07/2013  . Encounter for therapeutic drug monitoring 03/29/2013  . Chronic kidney disease, stage IV (severe) ([place]) 01/27/2013  . Dyspepsia 09/10/2012  . Gout 05/15/2012  . Chronic anticoagulation   . Congenital third degree heart block   . 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 Cardiac pacemaker in situ 04/24/2009  . 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 CARDIOMYOPATHY 01/20/2008  . ATRIAL FIBRILLATION, CHRONIC 01/20/2008  . Cardiorenal syndrome/chronic kidney disease 01/20/2008    Past Surgical History:  Procedure Laterality Date  . BI-VENTRICULAR IMPLANTABLE CARDIOVERTER DEFIBRILLATOR  (CRT-D)  04/21/2013  . BI-VENTRICULAR IMPLANTABLE CARDIOVERTER DEFIBRILLATOR UPGRADE N/A 04/21/2013   Procedure: BI-VENTRICULAR IMPLANTABLE CARDIOVERTER DEFIBRILLATOR UPGRADE;  Surgeon: Evans Lance, MD;  Location: [facility] CATH LAB;  Service: Cardiovascular;  Laterality: N/A;  . COLONOSCOPY  04/23/2011   URK:[id number] COLON POLYP/ INTERNAL HEMORRHOIDS/ TORTUOUS COLON  . INSERT / REPLACE / REMOVE PACEMAKER  09/1997   Guidant VVI [employer]  . PACEMAKER GENERATOR CHANGE  01/2004  . UPPER GASTROINTESTINAL ENDOSCOPY  FEB 2013   RING/DIL  TO 16 MM, H PYLORI GASTRITIS     OB History    Gravida  4   Para      Term      Preterm      AB      Living  4     SAB      TAB      Ectopic      Multiple      Live Births              Family History  Adopted: Yes  Problem Relation Age of Onset  . Autism Son   . Seizures Son   . Diabetes Daughter   . Colon cancer Neg Hx   . Colon polyps Neg Hx     Social History   Tobacco Use  . Smoking status: Never Smoker  . Smokeless tobacco: Never Used  . Tobacco comment: Never smoked  Substance Use Topics  . Alcohol use: No    Alcohol/week: 0.0 standard drinks  . Drug use: No    Home Medications Prior to Admission medications   Medication Sig Start Date End Date Taking? Authorizing Provider  acetaminophen (TYLENOL) 500 MG tablet Take 500 mg by mouth every 6 (six) hours as needed for moderate pain.    [provider]  Alcohol Swabs (ALCOHOL PREP) PADS 1 each by Does not apply route daily. 02/11/18   Elayne Snare, MD  allopurinol (ZYLOPRIM) 100 MG tablet Take 150 mg by mouth every morning. Reported on 06/15/2015    [provider]  benazepril (LOTENSIN) 10 MG tablet Take 10 mg by mouth daily.    [provider]  calcitRIOL (ROCALTROL) 0.25 MCG capsule Take 0.5 mcg by mouth daily. Take 2 tablets by mouth once daily.    [provider]  cholecalciferol (VITAMIN D3) 25 MCG (1000 UT) tablet Take 1,000 Units by mouth daily.    [provider]  COREG CR 40 MG 24 hr capsule TAKE ONE CAPSULE BY MOUTH DAILY. 11/24/17   Evans Lance, MD  digoxin (LANOXIN) 0.125 MG tablet TAKE 1 TABLET BY MOUTH DAILY EXCEPT ON SATURDAY AND SUNDAY. 11/27/18   Herminio Commons, MD  furosemide (LASIX) 80 MG tablet TAKE (1) TABLET BY MOUTH TWICE DAILY. 10/26/18   Herminio Commons, MD  insulin NPH Human (HUMULIN N) 100 UNIT/ML injection Inject 0.34 mLs (34 Units total) into the skin daily. Patient taking differently: Inject 22 Units into the skin  daily.  07/22/18   Elayne Snare, MD  Insulin Pen Needle 31G X 5 MM MISC 1 each by Does not apply route daily. Use as instructed to check blood sugar 4 times daily. 02/11/18   Elayne Snare, MD  Insulin Syringe-Needle U-100 30G 1 ML MISC 1 each by Does not apply route daily. Use needle to inject insulin 3 times daily. 02/13/18   Elayne Snare, MD  LITETOUCH PEN NEEDLES 31G X 8 MM MISC USE AS DIRECTED UP TO 4 TIMES DAILY. 01/27/19   Elayne Snare, MD  metolazone (ZAROXOLYN) 2.5 MG tablet Take 2.5 mg by mouth daily as needed. 02/01/19   [provider]  NOVOLOG FLEXPEN 100 UNIT/ML FlexPen INJECT SUBCUTANEOUSLY 12 UNITS BEFORE  BREAKFAST; 14 UNITS AT LUNCH; AND 16 UNITS AT DINNER. 01/27/19   Elayne Snare, MD  SURE COMFORT INSULIN SYRINGE 31G X 5/16" 0.5 ML MISC INJECT ONCE DAILY AS DIRECTED. 01/27/19   Elayne Snare, MD  TRESIBA FLEXTOUCH 200 UNIT/ML SOPN INJECT 56 UNITS INTO THE SKIN DAILY BEFORE BREAKFAST. 10/25/18   Elayne Snare, MD  VICTOZA 18 MG/3ML SOPN INJECT 1.2MG  SUBCUTANEOUSLY DAILY AS DIRECTED. 01/27/19   Elayne Snare, MD  warfarin (COUMADIN) 5 MG tablet TAKE 1/2 DAILY EXCEPT ON WEDNESDAY TAKE 1 TABLET. Patient taking differently: Take 2.5 mg by mouth daily. TAKE 1/2 DAILY. 06/30/18   Herminio Commons, MD    Allergies    Wheat, Latex, Penicillins, and Sulfa antibiotics  Review of Systems   Review of Systems  Constitutional: Negative for chills and fever.  Respiratory: Negative for shortness of breath.   Cardiovascular: Negative for chest pain and leg swelling.  Gastrointestinal: Negative for abdominal pain, nausea and vomiting.  Musculoskeletal: Positive for myalgias.  Neurological: Negative for weakness and numbness.       Negative for incontinence.     Physical Exam Updated Vital Signs BP 120/79 (BP Location: Right Arm)   Pulse 60   Temp 97.9 F (36.6 C) (Oral)   Resp 18   Ht 5\' 8"  (1.727 m)   Wt 82.6 kg   SpO2 99%   BMI 27.70 kg/m   Physical Exam Vitals and nursing note  reviewed.  Constitutional:      General: She is not in acute distress.    Appearance: She is not ill-appearing or toxic-appearing.  HENT:     Head: Normocephalic and atraumatic.  Cardiovascular:     Pulses:          Posterior tibial pulses are 2+ on the right side and 2+ on the left side.  Pulmonary:     Effort: Pulmonary effort is normal.  Musculoskeletal:     Comments: Back: No midline tenderness or palpable step off.  Lower extremities: varicose veins noted. No erythema, warmth, bleeding, edema, or ecchymosis. Intact AROM throughout. Tenderness to palpation to the bilateral gluteal region & diffuse thigh muscle groups bilaterally. No point/focal bony tenderness.   Skin:    General: Skin is warm and dry.     Capillary Refill: Capillary refill takes less than 2 seconds.     Findings: No rash.  Neurological:     Mental Status: She is alert.     Comments: Alert. Clear speech. Sensation grossly intact to bilateral lower extremities. 5/5 strength with hip flexion/extension, knee flexion/extension & ankle plantar/dorsiflexion bilaterally. Patient ambulatory with steady gait.   Psychiatric:        Mood and Affect: Mood normal.        Behavior: Behavior normal.     ED Results / Procedures / Treatments   Labs (all labs ordered are listed, but only abnormal results are displayed) Labs Reviewed - No data to display  EKG None  Radiology DG Pelvis 1-2 Views  Result Date: 02/10/2019 CLINICAL DATA:  Bilateral upper leg pain. EXAM: PELVIS - 1-2 VIEW COMPARISON:  None. FINDINGS: Hip joints and SI joints are symmetric and unremarkable. No acute bony abnormality. Specifically, no fracture, subluxation, or dislocation. Extensive calcified phleboliths in the pelvis. IMPRESSION: No acute bony abnormality. Electronically Signed   By: Rolm Baptise M.D.   On: 02/10/2019 13:44   DG Femur Min 2 Views Left  Result Date: 02/10/2019 CLINICAL DATA:  Bilateral upper leg pain.  No known injury. EXAM:  LEFT FEMUR 2 VIEWS COMPARISON:  None. FINDINGS: No acute bony abnormality. Specifically, no fracture, subluxation, or dislocation. Slight joint space narrowing in the medial compartment of the left knee. No joint effusion. Varicosities noted in the medial left thigh soft tissues. IMPRESSION: No acute bony abnormality. Electronically Signed   By: Rolm Baptise M.D.   On: 02/10/2019 13:43   DG Femur Min 2 Views Right  Result Date: 02/10/2019 CLINICAL DATA:  Bilateral upper leg pain. EXAM: RIGHT FEMUR 2 VIEWS COMPARISON:  None. FINDINGS: There is no evidence of fracture or other focal bone lesions. Soft tissues are unremarkable. IMPRESSION: Negative. Electronically Signed   By: Rolm Baptise M.D.   On: 02/10/2019 13:44    Procedures Procedures (including critical care time)  Medications Ordered in ED Medications - No data to display  ED Course  I have reviewed the triage vital signs and the nursing notes.  Pertinent labs & imaging results that were available during my care of the patient were reviewed by me and considered in my medical decision making (see chart for details).    MDM Rules/Calculators/A&P                      Patient presents to the ED with complaints of bilateral thigh aching sensation x 2 weeks. Atraumatic. Recent decorating of her home with position changes/squatting at times. Exam w/o erythema/warmth/crepitus- no signs of infectious process such as cellulitis or septic joint. No rashes. Varicosities present without bleeding. No edema, last INR 3.1, bilateral, do not suspect DVT- she states her coumadin doctor had given her instructions in regards to supratherapeutic INR. Also with sxs more proximal, no edema or dyspnea do not suspect CHF related. No ambulatory type sxs to indicate claudication/neurogenic claudication. Neurovascularly intact. Compartments are soft. Screening xrays which patient preferred to obtain without acute abnormality. Her recent labs from a hematology visit  for thrombocytopenia have been reviewed from 02/03/19 (within onset of pain) no significant anemia, rheumatoid factor negative, sed rate mildly elevated @ 35. She does have proximal thigh discomfort, but not having stiffness, no significant elevation in sed rate- only mildly elevated, considered PMR- does not seem classic for this, discussed with Dr. Sabra Heck- would avoid steroids currently given her other medical comorbidities and would patient patient follow up with PCP/rheumatology which I am in agreement with. Patient would like to avoid muscle relaxants/narcotics. Will avoid NSAIDs secondary to CKD & coumadin use. She states she would like to continue to try tylenol with application of heat. Also discussed compression stockings for varicose veins. PCP/rheumatology follow up. I discussed results, treatment plan, need for follow-up, and return precautions with the patient. Provided opportunity for questions, patient confirmed understanding and is in agreement with plan.    Final Clinical Impression(s) / ED Diagnoses Final diagnoses:  Bilateral leg pain    Rx / DC Orders ED Discharge Orders    None       Amaryllis Dyke, PA-C 02/10/19 1405    Noemi Chapel, MD 02/13/19 [phone]

## 2019-02-10 NOTE — Telephone Encounter (Signed)
Pt's husband called again and he was informed that the pt should contact her PCP.

## 2019-02-10 NOTE — Telephone Encounter (Signed)
Pt was brought into the office and she's having some pain in her legs and wanted Dr. Bronson Ing to be made aware. Would like someone to please call her  Please give pt a call [phone]

## 2019-02-10 NOTE — Telephone Encounter (Signed)
Patient called in stating that under her stomach has been in pain sore to touch and her thighs for the last few weeks and she hasn't been able to sleep. Patient states she also had fluid on her body. Patient just want the Dr to know she has been in a lot of pain and she knows she gives her shot in the stomach but she's in a lot of pain.   Please call and advise

## 2019-02-10 NOTE — Telephone Encounter (Signed)
Returned pt call. [name] is complaining of constant leg pain (soreness to touch) for past 2 weeks. [name] does not have any other symptoms. [name] states that [name] cannot sleep at night, as when [name] turns to either side it hurts. [name] has called her pcp and they told her to go to ER to be evaluated. [name] wanted to Make Dr. Bronson Ing aware.

## 2019-02-10 NOTE — Telephone Encounter (Signed)
She needs to be discussing these issues with her PCP

## 2019-02-10 NOTE — ED Triage Notes (Addendum)
PT states she has had bilateral upper leg aching unrelieved by Tylenol with no injury x2 weeks. PT denies any SOB.

## 2019-02-10 NOTE — Discharge Instructions (Signed)
You were seen in the emergency department today for aching pain to your upper legs.  Your x-rays were normal.  Please continue to try Tylenol per over-the-counter dosing.  Try applying a heating pad to your legs as needed to help with discomfort.  We also recommend compression stockings to help with your varicose veins.  We would like you to follow-up closely with your primary care provider, we have also given information for rheumatology in the area.  Please follow-up within 3 days for reevaluation.  Return to the ER for new or worsening symptoms including but not limited to increased pain, trouble walking, leg swelling, numbness, weakness, loss of control of bowel/bladder function, fever, vision changes, or any other concerns.

## 2019-02-11 ENCOUNTER — Telehealth: Payer: Self-pay | Admitting: Cardiovascular Disease

## 2019-02-11 ENCOUNTER — Ambulatory Visit: Payer: Medicaid Other

## 2019-02-11 NOTE — Telephone Encounter (Signed)
Patient wants to speak with Lattie Haw in reference to her recent medication change.  Stated she so dizzy and can not get up with out feeling sick. Also wanted to state that when she pricks her finger blood just runs out.

## 2019-02-11 NOTE — Telephone Encounter (Signed)
Thank you for letting me know

## 2019-02-11 NOTE — Telephone Encounter (Signed)
In ED yesterday.  Dx with vertigo. Explained dizziness should not be coming from Warfarin.  INR was checked on 12/14 and dose was actually decreased.  Offered to check INR this afternoon and appt made.

## 2019-02-16 ENCOUNTER — Telehealth: Payer: Self-pay | Admitting: *Deleted

## 2019-02-16 NOTE — Telephone Encounter (Signed)
Received message from [place], [employer] rep, that patient contacted him and left message requesting call back. Spoke with patient. She is concerned about her monitor not transmitting. Confirmed that it is not up to date despite cell adapter. Will need to attempt manual transmission. Advised pt that I will call back tomorrow between 10:00-10:30 to provider further assistance. Pt in agreement with plan. No further questions at this time.

## 2019-02-17 NOTE — Telephone Encounter (Signed)
Able to reach patient. Attempted to assist with monitor. Poor cell signal in bedroom, will attempt to move monitor to kitchen for better signal. Advised will call back shortly after pt has had a chance to move monitor.

## 2019-02-17 NOTE — Telephone Encounter (Signed)
Attempted to reach patient. Call went straight to VM, mailbox full, unable to LM.

## 2019-02-17 NOTE — Telephone Encounter (Signed)
I gave the pt the number to tech support to get additional help for the monitor.

## 2019-02-22 ENCOUNTER — Ambulatory Visit (INDEPENDENT_AMBULATORY_CARE_PROVIDER_SITE_OTHER): Payer: Medicaid Other | Admitting: *Deleted

## 2019-02-22 DIAGNOSIS — Z95810 Presence of automatic (implantable) cardiac defibrillator: Secondary | ICD-10-CM

## 2019-02-22 LAB — CUP PACEART REMOTE DEVICE CHECK
Date Time Interrogation Session: 20201228063335
Implantable Lead Implant Date: 20150225
Implantable Lead Implant Date: 20150225
Implantable Lead Location: 753858
Implantable Lead Location: 753860
Implantable Lead Model: 180
Implantable Lead Serial Number: 311545
Implantable Pulse Generator Implant Date: 20150225
Pulse Gen Serial Number: 114945

## 2019-02-23 NOTE — Telephone Encounter (Signed)
Transmission received. Normal device function.   Chanetta Marshall, NP 02/23/2019 9:33 AM

## 2019-02-23 NOTE — Progress Notes (Signed)
ICD remote 

## 2019-03-01 ENCOUNTER — Other Ambulatory Visit (HOSPITAL_COMMUNITY)
Admission: RE | Admit: 2019-03-01 | Discharge: 2019-03-01 | Disposition: A | Discharge status: Home / Self Care | Payer: Medicaid Other | Source: Ambulatory Visit | Attending: Cardiovascular Disease | Admitting: Cardiovascular Disease

## 2019-03-01 ENCOUNTER — Other Ambulatory Visit: Payer: Self-pay | Admitting: Internal Medicine

## 2019-03-01 ENCOUNTER — Other Ambulatory Visit (HOSPITAL_COMMUNITY): Payer: Self-pay | Admitting: Internal Medicine

## 2019-03-01 ENCOUNTER — Other Ambulatory Visit (HOSPITAL_COMMUNITY): Payer: Self-pay | Admitting: Nurse Practitioner

## 2019-03-01 ENCOUNTER — Encounter (HOSPITAL_COMMUNITY): Payer: Self-pay | Admitting: *Deleted

## 2019-03-01 ENCOUNTER — Other Ambulatory Visit: Payer: Self-pay

## 2019-03-01 ENCOUNTER — Telehealth: Payer: Self-pay | Admitting: Gastroenterology

## 2019-03-01 ENCOUNTER — Ambulatory Visit (INDEPENDENT_AMBULATORY_CARE_PROVIDER_SITE_OTHER): Payer: Medicaid Other | Admitting: *Deleted

## 2019-03-01 DIAGNOSIS — I4891 Unspecified atrial fibrillation: Secondary | ICD-10-CM

## 2019-03-01 DIAGNOSIS — Z5181 Encounter for therapeutic drug level monitoring: Secondary | ICD-10-CM | POA: Diagnosis present

## 2019-03-01 LAB — PROTIME-INR
INR: 6.6 (ref 0.8–1.2)
Prothrombin Time: 57.6 seconds — ABNORMAL HIGH (ref 11.4–15.2)

## 2019-03-01 LAB — POCT INR: INR: 7.2 — AB (ref 2.0–3.0)

## 2019-03-01 NOTE — Patient Instructions (Signed)
Hold warfarin and recheck on Thursday Continue greens as usual Recheck INR 03/04/19 Denies any sign of bleeding.  Bleeding and fall precautions discussed with patient and she verbalized understanding.

## 2019-03-01 NOTE — Telephone Encounter (Signed)
Spoke with pt. Pt is concerned with some swelling underneath the skin on the rt side of her abdomen. Pt has seen her PCP and was given a topical Dicofenac 1% topical gel. Pt is concerned with her abdomen and scheduled an apt for 03/03/2018 @ 8:30 with EG.

## 2019-03-01 NOTE — Telephone Encounter (Signed)
[phone] PLEASE CALL PATIENT, SHE SAID SHE IS HAVING PAIN IN HER ABDOMEN AND SHE FEELS A KNOT.  HAS NOT HEARD ABOUT RESULTS FROM PAST TESTS AND IS CONCERNED

## 2019-03-01 NOTE — Progress Notes (Signed)
Patient called clinic reporting "lymphnodes" that have been present since "before Thanksgiving".  She reports that she has seen her primary care and he gave her diclofenac cream to put on these "lymphnodes".  She reports that they are on her legs, thighs, hips and stomach. She denies any redness or change in the areas of concern.    I advised her to continue to follow up with Dr. Legrand Rams and to let him know that the therapy he prescribed hasn't changed the presence of these areas.  She voices understanding.

## 2019-03-02 ENCOUNTER — Other Ambulatory Visit: Payer: Self-pay

## 2019-03-02 ENCOUNTER — Encounter (HOSPITAL_COMMUNITY): Payer: Self-pay | Admitting: *Deleted

## 2019-03-02 ENCOUNTER — Other Ambulatory Visit: Payer: Self-pay | Admitting: Internal Medicine

## 2019-03-02 ENCOUNTER — Other Ambulatory Visit (HOSPITAL_COMMUNITY): Payer: Self-pay | Admitting: Internal Medicine

## 2019-03-02 DIAGNOSIS — R1900 Intra-abdominal and pelvic swelling, mass and lump, unspecified site: Secondary | ICD-10-CM

## 2019-03-02 NOTE — Progress Notes (Signed)
Patient called clinic today with the same reports from yesterday.  Her "lymphnodes" are bothering her and now she has one under her stomach that is red and hurting.  I advised her to follow up with her primary care physician and she said that she did yesterday and he wants her to keep using the diclofenac topical cream and take and extra tylenol.  She said that she needs a doctor to look at it.  I advised her to keep her appointment with [place] tomorrow and with Walden Field , PA at [facility] on Thursday for the same complaint.  I advised that we would look at it tomorrow. She verbalizes understanding.

## 2019-03-03 ENCOUNTER — Inpatient Hospital Stay (HOSPITAL_COMMUNITY): Payer: Medicaid Other

## 2019-03-03 ENCOUNTER — Ambulatory Visit (HOSPITAL_COMMUNITY)
Admission: RE | Admit: 2019-03-03 | Discharge: 2019-03-03 | Disposition: A | Discharge status: Home / Self Care | Payer: Medicaid Other | Source: Ambulatory Visit | Attending: Hematology | Admitting: Hematology

## 2019-03-03 ENCOUNTER — Encounter (HOSPITAL_COMMUNITY): Payer: Self-pay

## 2019-03-03 ENCOUNTER — Inpatient Hospital Stay (HOSPITAL_COMMUNITY): Payer: Medicaid Other | Attending: Hematology | Admitting: Hematology

## 2019-03-03 ENCOUNTER — Telehealth: Payer: Self-pay | Admitting: Cardiovascular Disease

## 2019-03-03 ENCOUNTER — Encounter (HOSPITAL_COMMUNITY): Payer: Self-pay | Admitting: Hematology

## 2019-03-03 VITALS — BP 106/58 | HR 57 | Temp 97.9°F | Resp 18 | Wt 193.0 lb

## 2019-03-03 DIAGNOSIS — D729 Disorder of white blood cells, unspecified: Secondary | ICD-10-CM

## 2019-03-03 DIAGNOSIS — Z7901 Long term (current) use of anticoagulants: Secondary | ICD-10-CM | POA: Diagnosis not present

## 2019-03-03 DIAGNOSIS — D472 Monoclonal gammopathy: Secondary | ICD-10-CM | POA: Insufficient documentation

## 2019-03-03 DIAGNOSIS — I4891 Unspecified atrial fibrillation: Secondary | ICD-10-CM | POA: Insufficient documentation

## 2019-03-03 DIAGNOSIS — D696 Thrombocytopenia, unspecified: Secondary | ICD-10-CM | POA: Diagnosis present

## 2019-03-03 MED ORDER — TRAMADOL HCL 50 MG PO TABS: 50.0000 mg | ORAL_TABLET | Freq: Two times a day (BID) | ORAL | 0 refills | Status: DC | PRN

## 2019-03-03 NOTE — Telephone Encounter (Signed)
She's already been instructed to take metolazone as needed. Has she tried that?

## 2019-03-03 NOTE — Assessment & Plan Note (Signed)
1.  Moderate thrombocytopenia: -She was evaluated for mild to moderate thrombocytopenia since 2019. -Platelet count 136 on 03/03/2017, 87 on 11/30/2018, and 96 on 01/14/2019. -She does not report any easy bruising.  However had excessive bleeding after recent tooth extraction.  She is also on Coumadin since 1999 for atrial fibrillation.  Apparently her Coumadin was not stopped prior to her extraction. -Denies any nosebleeds, hematuria or bleeding per rectum. -She is diagnosed with well compensated cirrhosis, etiology not clear at this time.  Differential included burned-out NASH, congestive heart failure among others. -Repeat CBC showed platelet count 95.  ANA and rheumatoid factor was negative.  Methylmalonic acid is slightly elevated.  B12 was normal.  LDH was normal. -Ultrasound of the spleen on 02/10/2019 shows normal size with 12.7 cm in volume of 250 cc. -Differential diagnosis includes immune mediated thrombocytopenia.  I have recommended B12 1 mg tablet daily. -We will continue to monitor her platelet count intermittently.  2.  Monoclonal gammopathy: -SPEP showed M spike of 1.3 g/dL. -I have explained to her about monoclonal gammopathy.  She requires further testing with immunofixation, free light chains, beta-2 microglobulin.  LDH was normal.  We will also order skeletal survey. -I have recommended 24-hour urine for total protein, UPEP, and urine immunofixation.  I will see her back in 3 weeks with labs and scan.  3.  Coagulopathy: -She is on Coumadin for her heart problems.  She was reportedly taking Tylenol for pains. -Her INR is supratherapeutic at 6.6.  She is holding Coumadin.  She has follow-up with Coumadin clinic. -She has hematoma in the subcutaneous region of the anterior abdominal wall, from insulin injections 3 times a day. -She also has some bruising over the anterior thighs which are also tender. -She is complaining of severe pain and difficulty sleeping at night. -I have  given her prescription for tramadol 50 mg at bedtime.  I have explained the side effects.  She was also told to use stool softener if she develops constipation.

## 2019-03-03 NOTE — Telephone Encounter (Signed)
Pt left voicemail stating she's having swelling in her legs, states legs are tight and the medication is not working

## 2019-03-03 NOTE — Telephone Encounter (Signed)
Patient reports weight was 190 lbs. Believes she has gained 10 lbs. Last weight was from televisit in January, reported as 193 lbs.. Swelling in ankles resolves over night.She is taking lasix 80 mg BID. And feels she needs a medication adjustment

## 2019-03-03 NOTE — Progress Notes (Signed)
[address] [place], [place] [address]   CLINIC:  Medical Oncology/Hematology  PCP:  [name], MD [place] [id number] [phone]   REASON FOR VISIT:  Follow-up for thrombocytopenia and monoclonal gammopathy.  CURRENT THERAPY: Under work-up.    INTERVAL HISTORY:  [name] 59 y.o. female seen for follow-up of thrombocytopenia and monoclonal gammopathy.  She reported pain in the anterior abdomen for the last few days.  She also noticed soreness and bruising on the upper thighs.  She reportedly went to the Coumadin clinic and was found to have supratherapeutic INR.  He is not bleeding anywhere else.  She reports some constipation.  She is not able to sleep well because of pain.  Appetite is 50%.  Energy levels are 25%.    REVIEW OF SYSTEMS:  Review of Systems  Gastrointestinal: Positive for abdominal pain and constipation.  Neurological: Positive for dizziness.  Hematological: Bruises/bleeds easily.  All other systems reviewed and are negative.    PAST MEDICAL/SURGICAL HISTORY:  Past Medical History:  Diagnosis Date  . Automatic implantable cardioverter-defibrillator in situ    a. 03/2013: BSX Energen CRTD BiV ICC, ser# [id number]  . Chronic anticoagulation    a. coumadin.  . Chronic atrial fibrillation (HCC)    embolic rind  . Chronic kidney disease    creatinine- 1.8 in 09/2006 and 2.0 in 05/2007; 2.05 in 2011, 2.29 in 2012  . Chronic systolic CHF (congestive heart failure) ([place])    a. 03/2013 Echo: Sev LV dysfxn with sev diff HK, restrictive phys, diast dysfxn, mild MR, sev dil LA/RA, mod PR, PASP 21mHg.  . Congenital third degree heart block    a. Guidant VVI pacemaker implanted in 09/1997; b. gen change in 01/2004;  c. 03/2013 upgrade to [facility] ser# [id number]  . Dizziness and giddiness 05/19/2012  . Dysphagia 08/14/2011   FEB 2013 EGD/DIL 16 MM; GERD; h/o gastroesophageal reflux disease; + H.  Pylori gastritis   . GERD (gastroesophageal reflux disease)   . Helicobacter pylori gastritis 08/14/2011  . IDDM (insulin dependent diabetes mellitus)   . Kidney stones 1990's  . Nonischemic cardiomyopathy ([facility]    a. 03/2013 Echo: Sev LV dysfxn with sev diff HK, restrictive phys, diast dysfxn, mild MR, sev dil LA/RA, mod PR, PASP 597mg;  b. 03/2013: BSX Energen CRTD BiV ICC, ser# [id number] . Marland Kitchenotassium (K) excess   . [facility]   denies residual on 04/21/2013   Past Surgical History:  Procedure Laterality Date  . BI-VENTRICULAR IMPLANTABLE CARDIOVERTER DEFIBRILLATOR  (CRT-D)  04/21/2013  . BI-VENTRICULAR IMPLANTABLE CARDIOVERTER DEFIBRILLATOR UPGRADE N/A 04/21/2013   Procedure: BI-VENTRICULAR IMPLANTABLE CARDIOVERTER DEFIBRILLATOR UPGRADE;  Surgeon: GrEvans LanceMD;  [facility];  Service: Cardiovascular;  Laterality: N/A;  . COLONOSCOPY  04/23/2011   SLLZJ:QBHALPFXTKOLON POLYP/ INTERNAL HEMORRHOIDS/ TORTUOUS COLON  . INSERT / REPLACE / REMOVE PACEMAKER  09/1997   Guidant VVI [employer]  . PACEMAKER GENERATOR CHANGE  01/2004  . UPPER GASTROINTESTINAL ENDOSCOPY  FEB 2013   RING/DIL TO 16 MM, H PYLORI GASTRITIS     SOCIAL HISTORY:  Social History   Socioeconomic History  . Marital status: Married    Spouse name: Not on file  . Number of children: 4  . Years of education: Not on file  . Highest education level: Not on file  Occupational History    Employer: UNEMPLOYED  Tobacco Use  . Smoking status: Never  Smoker  . Smokeless tobacco: Never Used  . Tobacco comment: Never smoked  Substance and Sexual Activity  . Alcohol use: No    Alcohol/week: 0.0 standard drinks  . Drug use: No  . Sexual activity: Not Currently    Birth control/protection: Post-menopausal  Other Topics Concern  . Not on file  Social History Narrative  . Not on file   Social Determinants of Health   Financial Resource Strain:   . Difficulty of Paying Living Expenses: Not on file    Food Insecurity:   . Worried About Charity fundraiser in the Last Year: Not on file  . Ran Out of Food in the Last Year: Not on file  Transportation Needs:   . Lack of Transportation (Medical): Not on file  . Lack of Transportation (Non-Medical): Not on file  Physical Activity:   . Days of Exercise per Week: Not on file  . Minutes of Exercise per Session: Not on file  Stress:   . Feeling of Stress : Not on file  Social Connections:   . Frequency of Communication with Friends and Family: Not on file  . Frequency of Social Gatherings with Friends and Family: Not on file  . Attends Religious Services: Not on file  . Active Member of Clubs or Organizations: Not on file  . Attends Archivist Meetings: Not on file  . Marital Status: Not on file  Intimate Partner Violence:   . Fear of Current or Ex-Partner: Not on file  . Emotionally Abused: Not on file  . Physically Abused: Not on file  . Sexually Abused: Not on file    FAMILY HISTORY:  Family History  Adopted: Yes  Problem Relation Age of Onset  . Autism Son   . Seizures Son   . Diabetes Daughter   . Colon cancer Neg Hx   . Colon polyps Neg Hx     CURRENT MEDICATIONS:  Outpatient Encounter Medications as of 03/03/2019  Medication Sig  . ACCU-CHEK GUIDE test strip USE TO TEST BLOOD SUGARWFOUR TIMES DAILY AS DIRECTED.  Marland Kitchen Alcohol Swabs (ALCOHOL PREP) PADS 1 each by Does not apply route daily.  Marland Kitchen allopurinol (ZYLOPRIM) 100 MG tablet Take 150 mg by mouth every morning. Reported on 06/15/2015  . benazepril (LOTENSIN) 10 MG tablet Take 10 mg by mouth daily.  . calcitRIOL (ROCALTROL) 0.25 MCG capsule Take 0.5 mcg by mouth daily. Take 2 tablets by mouth once daily.  . cholecalciferol (VITAMIN D3) 25 MCG (1000 UT) tablet Take 1,000 Units by mouth daily.  Marland Kitchen COREG CR 40 MG 24 hr capsule TAKE ONE CAPSULE BY MOUTH DAILY.  . digoxin (LANOXIN) 0.125 MG tablet TAKE 1 TABLET BY MOUTH DAILY EXCEPT ON SATURDAY AND SUNDAY.  .  furosemide (LASIX) 80 MG tablet TAKE (1) TABLET BY MOUTH TWICE DAILY.  Marland Kitchen insulin NPH Human (HUMULIN N) 100 UNIT/ML injection Inject 0.34 mLs (34 Units total) into the skin daily. (Patient taking differently: Inject 22 Units into the skin daily. )  . Insulin Pen Needle 31G X 5 MM MISC 1 each by Does not apply route daily. Use as instructed to check blood sugar 4 times daily.  . Insulin Syringe-Needle U-100 30G 1 ML MISC 1 each by Does not apply route daily. Use needle to inject insulin 3 times daily.  Marland Kitchen LITETOUCH PEN NEEDLES 31G X 8 MM MISC USE AS DIRECTED UP TO 4 TIMES DAILY.  Marland Kitchen NOVOLOG FLEXPEN 100 UNIT/ML FlexPen INJECT SUBCUTANEOUSLY 12 UNITS BEFORE BREAKFAST;  14 UNITS AT LUNCH; AND 16 UNITS AT DINNER.  Haig Prophet COMFORT INSULIN SYRINGE 31G X 5/16" 0.5 ML MISC INJECT ONCE DAILY AS DIRECTED.  Marland Kitchen TRESIBA FLEXTOUCH 200 UNIT/ML SOPN INJECT 56 UNITS INTO THE SKIN DAILY BEFORE BREAKFAST.  Marland Kitchen VICTOZA 18 MG/3ML SOPN INJECT 1.2MG SUBCUTANEOUSLY DAILY AS DIRECTED.  Marland Kitchen warfarin (COUMADIN) 5 MG tablet TAKE 1/2 DAILY EXCEPT ON WEDNESDAY TAKE 1 TABLET. (Patient taking differently: Take 2.5 mg by mouth daily. TAKE 1/2 DAILY.)  . acetaminophen (TYLENOL) 500 MG tablet Take 500 mg by mouth every 6 (six) hours as needed for moderate pain.  Marland Kitchen diclofenac Sodium (VOLTAREN) 1 % GEL APPLY TO AFFECTED AREASETWICE DAILY.  . meclizine (ANTIVERT) 12.5 MG tablet Take 12.5 mg by mouth 3 (three) times daily as needed.  . metolazone (ZAROXOLYN) 2.5 MG tablet Take 2.5 mg by mouth daily as needed.  . traMADol (ULTRAM) 50 MG tablet Take 1 tablet (50 mg total) by mouth 2 (two) times daily as needed.   No facility-administered encounter medications on file as of 03/03/2019.    ALLERGIES:  Allergies  Allergen Reactions  . Wheat Swelling  . Latex Rash  . Penicillins Rash  . Sulfa Antibiotics Rash     PHYSICAL EXAM:  ECOG Performance status: 1  Vitals:   03/03/19 1104  BP: (!) 106/58  Pulse: (!) 57  Resp: 18  Temp: 97.9 F  (36.6 C)  SpO2: 94%   Filed Weights   03/03/19 1104  Weight: 193 lb (87.5 kg)    Physical Exam Vitals reviewed.  Constitutional:      Appearance: Normal appearance.  Cardiovascular:     Rate and Rhythm: Normal rate.     Heart sounds: Normal heart sounds.  Pulmonary:     Effort: Pulmonary effort is normal.     Breath sounds: Normal breath sounds.  Abdominal:     Palpations: Abdomen is soft.  Musculoskeletal:        General: Normal range of motion.  Skin:    General: Skin is warm.     Findings: Bruising present.  Neurological:     General: No focal deficit present.     Mental Status: She is alert and oriented to person, place, and time.  Psychiatric:        Mood and Affect: Mood normal.        Behavior: Behavior normal.   Ecchymosis in the anterior abdominal wall which is tender present.  Bruising present on the upper thighs.   LABORATORY DATA:  I have reviewed the labs as listed.  CBC    Component Value Date/Time   WBC 5.2 02/03/2019 1201   RBC 4.39 02/03/2019 1201   HGB 12.1 02/03/2019 1201   HCT 38.4 02/03/2019 1201   PLT 95 (L) 02/03/2019 1201   MCV 87.5 02/03/2019 1201   MCH 27.6 02/03/2019 1201   MCHC 31.5 02/03/2019 1201   RDW 17.1 (H) 02/03/2019 1201   LYMPHSABS 0.9 02/03/2019 1201   MONOABS 0.4 02/03/2019 1201   EOSABS 0.2 02/03/2019 1201   BASOSABS 0.0 02/03/2019 1201   CMP Latest Ref Rng & Units 01/18/2019 11/30/2018 09/23/2018  Glucose 70 - 99 mg/dL 121(H) 172(H) 197(H)  BUN 6 - 23 mg/dL 85(HH) 93(H) 82(H)  Creatinine 0.40 - 1.20 mg/dL 3.10(H) 3.62(H) 3.07(H)  Sodium 135 - 145 mEq/L 139 137 136  Potassium 3.5 - 5.1 mEq/L 4.7 3.8 4.6  Chloride 96 - 112 mEq/L 104 103 102  CO2 19 - 32 mEq/L 25 20(L) 23  Calcium 8.4 - 10.5 mg/dL 9.6 9.4 9.4  Total Protein 6.0 - 8.3 g/dL 7.1 - 7.2  Total Bilirubin 0.2 - 1.2 mg/dL 2.3(H) - 1.6(H)  Alkaline Phos 39 - 117 U/L 256(H) - 218(H)  AST 0 - 37 U/L 24 - 24  ALT 0 - 35 U/L 18 - 19       DIAGNOSTIC  IMAGING:  I have independently reviewed the scans and discussed with the patient.    ASSESSMENT & PLAN:   Thrombocytopenia ([place]) 1.  Moderate thrombocytopenia: -She was evaluated for mild to moderate thrombocytopenia since 2019. -Platelet count 136 on 03/03/2017, 87 on 11/30/2018, and 96 on 01/14/2019. -She does not report any easy bruising.  However had excessive bleeding after recent tooth extraction.  She is also on Coumadin since 1999 for atrial fibrillation.  Apparently her Coumadin was not stopped prior to her extraction. -Denies any nosebleeds, hematuria or bleeding per rectum. -She is diagnosed with well compensated cirrhosis, etiology not clear at this time.  Differential included burned-out NASH, congestive heart failure among others. -Repeat CBC showed platelet count 95.  ANA and rheumatoid factor was negative.  Methylmalonic acid is slightly elevated.  B12 was normal.  LDH was normal. -Ultrasound of the spleen on 02/10/2019 shows normal size with 12.7 cm in volume of 250 cc. -Differential diagnosis includes immune mediated thrombocytopenia.  I have recommended B12 1 mg tablet daily. -We will continue to monitor her platelet count intermittently.  2.  Monoclonal gammopathy: -SPEP showed M spike of 1.3 g/dL. -I have explained to her about monoclonal gammopathy.  She requires further testing with immunofixation, free light chains, beta-2 microglobulin.  LDH was normal.  We will also order skeletal survey. -I have recommended 24-hour urine for total protein, UPEP, and urine immunofixation.  I will see her back in 3 weeks with labs and scan.  3.  Coagulopathy: -She is on Coumadin for her heart problems.  She was reportedly taking Tylenol for pains. -Her INR is supratherapeutic at 6.6.  She is holding Coumadin.  She has follow-up with Coumadin clinic. -She has hematoma in the subcutaneous region of the anterior abdominal wall, from insulin injections 3 times a day. -She also has some  bruising over the anterior thighs which are also tender. -She is complaining of severe pain and difficulty sleeping at night. -I have given her prescription for tramadol 50 mg at bedtime.  I have explained the side effects.  She was also told to use stool softener if she develops constipation.      Orders placed this encounter:  Orders Placed This Encounter  Procedures  . DG Bone Survey Met  . Immunofixation electrophoresis  . Beta 2 microglobuline, serum  . 24 hr, Ur UPEP/UIFE/Light Chains/TP  . Kappa/lambda light chains      Derek Jack, [place] [phone]

## 2019-03-03 NOTE — Patient Instructions (Addendum)
[place] at [facility] Discharge Instructions  You were seen today by Dr. Delton Coombes. He went over your recent lab results. Start taking B12 img daily.He will have blood work drawn today as well as a Corporate treasurer. He will see you back in 3 weeks for  follow up.   Thank you for choosing [place] at [facility] to provide your oncology and hematology care.  To afford each patient quality time with our provider, please arrive at least 15 minutes before your scheduled appointment time.   If you have a lab appointment with the [place] please come in thru the  Main Entrance and check in at the main information desk  You need to re-schedule your appointment should you arrive 10 or more minutes late.  We strive to give you quality time with our providers, and arriving late affects you and other patients whose appointments are after yours.  Also, if you no show three or more times for appointments you may be dismissed from the clinic at the providers discretion.     Again, thank you for choosing [facility].  Our hope is that these requests will decrease the amount of time that you wait before being seen by our physicians.       _____________________________________________________________  Should you have questions after your visit to [facility], please contact our office at [phone] between the hours of 8:00 a.m. and 4:30 p.m.  Voicemails left after 4:00 p.m. will not be returned until the following business day.  For prescription refill requests, have your pharmacy contact our office and allow 72 hours.    Cancer Center Support Programs:   > Cancer Support Group  2nd Tuesday of the month 1pm-2pm, Journey Room

## 2019-03-04 ENCOUNTER — Ambulatory Visit: Payer: Medicaid Other | Admitting: Nurse Practitioner

## 2019-03-04 ENCOUNTER — Ambulatory Visit (INDEPENDENT_AMBULATORY_CARE_PROVIDER_SITE_OTHER): Payer: Medicaid Other | Admitting: *Deleted

## 2019-03-04 ENCOUNTER — Other Ambulatory Visit: Payer: Self-pay

## 2019-03-04 ENCOUNTER — Encounter: Payer: Self-pay | Admitting: Gastroenterology

## 2019-03-04 ENCOUNTER — Telehealth: Payer: Self-pay | Admitting: Gastroenterology

## 2019-03-04 DIAGNOSIS — Z5181 Encounter for therapeutic drug level monitoring: Secondary | ICD-10-CM | POA: Diagnosis not present

## 2019-03-04 DIAGNOSIS — I4891 Unspecified atrial fibrillation: Secondary | ICD-10-CM

## 2019-03-04 LAB — BETA 2 MICROGLOBULIN, SERUM: Beta-2 Microglobulin: 9.6 mg/L — ABNORMAL HIGH (ref 0.6–2.4)

## 2019-03-04 LAB — KAPPA/LAMBDA LIGHT CHAINS
Kappa free light chain: 1392.3 mg/L — ABNORMAL HIGH (ref 3.3–19.4)
Kappa, lambda light chain ratio: 26.88 — ABNORMAL HIGH (ref 0.26–1.65)
Lambda free light chains: 51.8 mg/L — ABNORMAL HIGH (ref 5.7–26.3)

## 2019-03-04 LAB — POCT INR: INR: 5.1 — AB (ref 2.0–3.0)

## 2019-03-04 NOTE — Patient Instructions (Signed)
Hold warfarin and recheck on Monday Continue greens as usual Recheck INR 03/08/19 Denies any sign of bleeding.  Bleeding and fall precautions discussed with patient and she verbalized understanding.

## 2019-03-04 NOTE — Telephone Encounter (Signed)
Called and spoke to patient. She states that she has had bilateral swelling in her lower extremities for the past few months that has worsened. She states that they feel tight and are weeping. States that she has some SOB on exertion. She states that she has gained weight and that her weight today was 177 lbs. Made patient aware that she reported that her weight was 190 lbs yesterday. Patient states that she does not keep a record of her weight daily and it was a different scale. Educated the patient of the importance of weighing herself daily at the same time with the same amount of clothes and to keep a record. Patient states that she has been taking furosemide 80 mg BID. She states that she has been taking her metolazone 2.5 mg QD prn since Thanksgiving. She states that she has taken it everyday since last week and sometimes at night. Made patient aware that she should take her metolazone 30 minutes prior to lasix. Patient denies excess salt in her diet. Instructed the patient to elevate her legs to help with swelling and that I would forward to Dr. Bronson Ing for review and recommendation. She verbalized understanding.

## 2019-03-04 NOTE — Telephone Encounter (Signed)
Patient was a no show and letter sent  °

## 2019-03-04 NOTE — Telephone Encounter (Signed)
Please give pt a call back - she's in a lot of pain

## 2019-03-04 NOTE — Telephone Encounter (Signed)
Left message to call back  

## 2019-03-04 NOTE — Telephone Encounter (Signed)
Can try switching Lasix to torsemide 20 mg twice daily to see if this helps.  I also agree with your recommendations.

## 2019-03-04 NOTE — Progress Notes (Deleted)
Referring Provider: [name], MD Primary Care Physician:  [name], MD Primary GI:  Dr. Oneida Alar  No chief complaint on file.   HPI:   [name] is a 59 y.o. female who presents for follow-up on abdominal pain and "swelling under the skin."  Patient was last seen in our office 01/14/2019 for cirrhosis.  Noted history of AICD, on chronic anticoagulation with Coumadin for atrial fibrillation, chronic kidney disease, chronic systolic heart failure, GERD.  At her last visit she was concerned about her thrombocytopenia when she had a tooth extracted she bled heavily.  No other overt GI complaints.  Noted cirrhosis of unclear etiology with well compensated disease likely related to heart failure versus burned-out [place].  Recommended labs and ultrasound, see hematology for thrombocytopenia, follow-up in 6 months.  A plethora of labs were completed 01/14/2019 including CBC that showed normal hemoglobin at 12.0, low platelet count at 96.  Positive hepatitis A antibody, negative hepatitis B and C serologies.  ANA and anti-smooth muscle antibodies were negative.  IgA/IgG/IgM panel mostly normal other than mildly elevated IgG at 1855 (upper limit normal 1640).  Normal iron panel, ferritin, mitochondrial antibodies.  Overall felt IgG not clinically significant.  CMP completed next week found baseline creatinine of 3.10 with a GFR of 18.57.  LFTs found elevated bilirubin at 2.3, elevated alkaline phosphatase at 256.  AST/ALT were normal.  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  Otherwise ultrasound was normal.  She called our office with complaints of leg swelling and soreness and recommended she see her PCP or cardiologist regarding leg swelling.  Patient last saw hematology yesterday 03/03/2019 for thrombocytopenia and plasma cell disorder.  Noted monoclonal gammopathy.  Noted pain in the anterior abdomen for the  past several days as well as soreness and bruising on her upper thighs.  Found to have supratherapeutic INR at Coumadin clinic although no excessive bleeding noted.  Some constipation.  Diminished appetite and energy.  Noted low B12 level and recommended 1 mg supplementation daily.  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  Work-up is ongoing for monoclonal gammopathy.  Today she states   Past Medical History:  Diagnosis Date  . Automatic implantable cardioverter-defibrillator in situ    a. 03/2013: BSX Energen CRTD BiV ICC, ser# [id number]  . Chronic anticoagulation    a. coumadin.  . Chronic atrial fibrillation (HCC)    embolic rind  . Chronic kidney disease    creatinine- 1.8 in 09/2006 and 2.0 in 05/2007; 2.05 in 2011, 2.29 in 2012  . Chronic systolic CHF (congestive heart failure) ([place])    a. 03/2013 Echo: Sev LV dysfxn with sev diff HK, restrictive phys, diast dysfxn, mild MR, sev dil LA/RA, mod PR, PASP 74mmHg.  . Congenital third degree heart block    a. Guidant VVI pacemaker implanted in 09/1997; b. gen change in 01/2004;  c. 03/2013 upgrade to [place], ser# [id number].  . Dizziness and giddiness 05/19/2012  . Dysphagia 08/14/2011   FEB 2013 EGD/DIL 16 MM; GERD; h/o gastroesophageal reflux disease; + H. Pylori gastritis   . GERD (gastroesophageal reflux disease)   . Helicobacter pylori gastritis 08/14/2011  . IDDM (insulin dependent diabetes mellitus)   . Kidney stones 1990's  . Nonischemic cardiomyopathy ([place])    a. 03/2013 Echo: Sev LV dysfxn with sev diff HK, restrictive phys, diast dysfxn, mild MR, sev dil LA/RA,  mod PR, PASP 87mmHg;  b. 03/2013: BSX Energen CRTD BiV ICC, ser# [id number].  Marland Kitchen Potassium (K) excess   . Stroke [facility]) 1999   denies residual on 04/21/2013    Past Surgical History:  Procedure Laterality Date  . BI-VENTRICULAR IMPLANTABLE CARDIOVERTER DEFIBRILLATOR  (CRT-D)  04/21/2013  . BI-VENTRICULAR IMPLANTABLE  CARDIOVERTER DEFIBRILLATOR UPGRADE N/A 04/21/2013   Procedure: BI-VENTRICULAR IMPLANTABLE CARDIOVERTER DEFIBRILLATOR UPGRADE;  Surgeon: Evans Lance, MD;  Location: [facility] CATH LAB;  Service: Cardiovascular;  Laterality: N/A;  . COLONOSCOPY  04/23/2011   QJJ:HERDEYCXKG COLON POLYP/ INTERNAL HEMORRHOIDS/ TORTUOUS COLON  . INSERT / REPLACE / REMOVE PACEMAKER  09/1997   Guidant VVI [employer]  . PACEMAKER GENERATOR CHANGE  01/2004  . UPPER GASTROINTESTINAL ENDOSCOPY  FEB 2013   RING/DIL TO 16 MM, H PYLORI GASTRITIS    Current Outpatient Medications  Medication Sig Dispense Refill  . ACCU-CHEK GUIDE test strip USE TO TEST BLOOD SUGARWFOUR TIMES DAILY AS DIRECTED.    Marland Kitchen acetaminophen (TYLENOL) 500 MG tablet Take 500 mg by mouth every 6 (six) hours as needed for moderate pain.    . Alcohol Swabs (ALCOHOL PREP) PADS 1 each by Does not apply route daily. 1000 each 3  . allopurinol (ZYLOPRIM) 100 MG tablet Take 150 mg by mouth every morning. Reported on 06/15/2015    . benazepril (LOTENSIN) 10 MG tablet Take 10 mg by mouth daily.    . calcitRIOL (ROCALTROL) 0.25 MCG capsule Take 0.5 mcg by mouth daily. Take 2 tablets by mouth once daily.    . cholecalciferol (VITAMIN D3) 25 MCG (1000 UT) tablet Take 1,000 Units by mouth daily.    Marland Kitchen COREG CR 40 MG 24 hr capsule TAKE ONE CAPSULE BY MOUTH DAILY. 30 capsule 6  . diclofenac Sodium (VOLTAREN) 1 % GEL APPLY TO AFFECTED AREASETWICE DAILY.    . digoxin (LANOXIN) 0.125 MG tablet TAKE 1 TABLET BY MOUTH DAILY EXCEPT ON SATURDAY AND SUNDAY. 20 tablet 6  . furosemide (LASIX) 80 MG tablet TAKE (1) TABLET BY MOUTH TWICE DAILY. 60 tablet 6  . insulin NPH Human (HUMULIN N) 100 UNIT/ML injection Inject 0.34 mLs (34 Units total) into the skin daily. (Patient taking differently: Inject 22 Units into the skin daily. ) 20 mL 3  . Insulin Pen Needle 31G X 5 MM MISC 1 each by Does not apply route daily. Use as instructed to check blood sugar 4 times daily. 200 each 3  .  Insulin Syringe-Needle U-100 30G 1 ML MISC 1 each by Does not apply route daily. Use needle to inject insulin 3 times daily. 250 each 3  . LITETOUCH PEN NEEDLES 31G X 8 MM MISC USE AS DIRECTED UP TO 4 TIMES DAILY. 200 each 0  . meclizine (ANTIVERT) 12.5 MG tablet Take 12.5 mg by mouth 3 (three) times daily as needed.    . metolazone (ZAROXOLYN) 2.5 MG tablet Take 2.5 mg by mouth daily as needed.    Marland Kitchen NOVOLOG FLEXPEN 100 UNIT/ML FlexPen INJECT SUBCUTANEOUSLY 12 UNITS BEFORE BREAKFAST; 14 UNITS AT LUNCH; AND 16 UNITS AT DINNER. 15 mL 0  . SURE COMFORT INSULIN SYRINGE 31G X 5/16" 0.5 ML MISC INJECT ONCE DAILY AS DIRECTED. 100 each 0  . traMADol (ULTRAM) 50 MG tablet Take 1 tablet (50 mg total) by mouth 2 (two) times daily as needed. 30 tablet 0  . TRESIBA FLEXTOUCH 200 UNIT/ML SOPN INJECT 56 UNITS INTO THE SKIN DAILY BEFORE BREAKFAST. 9 mL 0  . VICTOZA 18 MG/3ML SOPN  INJECT 1.2MG  SUBCUTANEOUSLY DAILY AS DIRECTED. 6 mL 0  . warfarin (COUMADIN) 5 MG tablet TAKE 1/2 DAILY EXCEPT ON WEDNESDAY TAKE 1 TABLET. (Patient taking differently: Take 2.5 mg by mouth daily. TAKE 1/2 DAILY.) 20 tablet 6   No current facility-administered medications for this visit.    Allergies as of 03/04/2019 - Review Complete 03/03/2019  Allergen Reaction Noted  . Wheat Swelling 03/28/2011  . Latex Rash 03/28/2011  . Penicillins Rash 03/28/2011  . Sulfa antibiotics Rash 03/28/2011    Family History  Adopted: Yes  Problem Relation Age of Onset  . Autism Son   . Seizures Son   . Diabetes Daughter   . Colon cancer Neg Hx   . Colon polyps Neg Hx     Social History   Socioeconomic History  . Marital status: Married    Spouse name: Not on file  . Number of children: 4  . Years of education: Not on file  . Highest education level: Not on file  Occupational History    Employer: UNEMPLOYED  Tobacco Use  . Smoking status: Never Smoker  . Smokeless tobacco: Never Used  . Tobacco comment: Never smoked  Substance  and Sexual Activity  . Alcohol use: No    Alcohol/week: 0.0 standard drinks  . Drug use: No  . Sexual activity: Not Currently    Birth control/protection: Post-menopausal  Other Topics Concern  . Not on file  Social History Narrative  . Not on file   Social Determinants of Health   Financial Resource Strain:   . Difficulty of Paying Living Expenses: Not on file  Food Insecurity:   . Worried About Charity fundraiser in the Last Year: Not on file  . Ran Out of Food in the Last Year: Not on file  Transportation Needs:   . Lack of Transportation (Medical): Not on file  . Lack of Transportation (Non-Medical): Not on file  Physical Activity:   . Days of Exercise per Week: Not on file  . Minutes of Exercise per Session: Not on file  Stress:   . Feeling of Stress : Not on file  Social Connections:   . Frequency of Communication with Friends and Family: Not on file  . Frequency of Social Gatherings with Friends and Family: Not on file  . Attends Religious Services: Not on file  . Active Member of Clubs or Organizations: Not on file  . Attends Archivist Meetings: Not on file  . Marital Status: Not on file    Review of Systems: General: Negative for anorexia, weight loss, fever, chills, fatigue, weakness. Eyes: Negative for vision changes.  ENT: Negative for hoarseness, difficulty swallowing , nasal congestion. CV: Negative for chest pain, angina, palpitations, dyspnea on exertion, peripheral edema.  Respiratory: Negative for dyspnea at rest, dyspnea on exertion, cough, sputum, wheezing.  GI: See history of present illness. GU:  Negative for dysuria, hematuria, urinary incontinence, urinary frequency, nocturnal urination.  MS: Negative for joint pain, low back pain.  Derm: Negative for rash or itching.  Neuro: Negative for weakness, abnormal sensation, seizure, frequent headaches, memory loss, confusion.  Psych: Negative for anxiety, depression, suicidal ideation,  hallucinations.  Endo: Negative for unusual weight change.  Heme: Negative for bruising or bleeding. Allergy: Negative for rash or hives.   Physical Exam: There were no vitals taken for this visit. General:   Alert and oriented. Pleasant and cooperative. Well-nourished and well-developed.  Head:  Normocephalic and atraumatic. Eyes:  Without icterus, sclera  clear and conjunctiva pink.  Ears:  Normal auditory acuity. Mouth:  No deformity or lesions, oral mucosa pink.  Throat/Neck:  Supple, without mass or thyromegaly. Cardiovascular:  S1, S2 present without murmurs appreciated. Normal pulses noted. Extremities without clubbing or edema. Respiratory:  Clear to auscultation bilaterally. No wheezes, rales, or rhonchi. No distress.  Gastrointestinal:  +BS, soft, non-tender and non-distended. No HSM noted. No guarding or rebound. No masses appreciated.  Rectal:  Deferred  Musculoskalatal:  Symmetrical without gross deformities. Normal posture. Skin:  Intact without significant lesions or rashes. Neurologic:  Alert and oriented x4;  grossly normal neurologically. Psych:  Alert and cooperative. Normal mood and affect. Heme/Lymph/Immune: No significant cervical adenopathy. No excessive bruising noted.    03/04/2019 7:45 AM   Disclaimer: This note was dictated with voice recognition software. Similar sounding words can inadvertently be transcribed and may not be corrected upon review.

## 2019-03-05 LAB — IMMUNOFIXATION ELECTROPHORESIS
IgA: 165 mg/dL (ref 87–352)
IgG (Immunoglobin G), Serum: 1917 mg/dL — ABNORMAL HIGH (ref 586–1602)
IgM (Immunoglobulin M), Srm: 49 mg/dL (ref 26–217)
Total Protein ELP: 6.7 g/dL (ref 6.0–8.5)

## 2019-03-05 MED ORDER — TORSEMIDE 20 MG PO TABS: 20.0000 mg | ORAL_TABLET | Freq: Two times a day (BID) | ORAL | 3 refills | Status: DC

## 2019-03-05 NOTE — Telephone Encounter (Signed)
[name] will stop lasix and switch to Torsemide 20 mg BID.Her spouse will pick up this am.

## 2019-03-07 ENCOUNTER — Inpatient Hospital Stay (HOSPITAL_COMMUNITY)
Admission: EM | Admit: 2019-03-07 | Discharge: 2019-03-29 | DRG: 286 | Disposition: A | Discharge status: Home Health | Payer: Medicaid Other | Attending: Cardiology | Admitting: Cardiology

## 2019-03-07 ENCOUNTER — Emergency Department (HOSPITAL_COMMUNITY): Payer: Medicaid Other

## 2019-03-07 ENCOUNTER — Other Ambulatory Visit: Payer: Self-pay

## 2019-03-07 ENCOUNTER — Encounter (HOSPITAL_COMMUNITY): Payer: Self-pay | Admitting: Emergency Medicine

## 2019-03-07 DIAGNOSIS — N179 Acute kidney failure, unspecified: Secondary | ICD-10-CM | POA: Diagnosis present

## 2019-03-07 DIAGNOSIS — Z8673 Personal history of transient ischemic attack (TIA), and cerebral infarction without residual deficits: Secondary | ICD-10-CM

## 2019-03-07 DIAGNOSIS — R601 Generalized edema: Secondary | ICD-10-CM

## 2019-03-07 DIAGNOSIS — Z992 Dependence on renal dialysis: Secondary | ICD-10-CM

## 2019-03-07 DIAGNOSIS — Z20822 Contact with and (suspected) exposure to covid-19: Secondary | ICD-10-CM | POA: Diagnosis present

## 2019-03-07 DIAGNOSIS — I132 Hypertensive heart and chronic kidney disease with heart failure and with stage 5 chronic kidney disease, or end stage renal disease: Principal | ICD-10-CM | POA: Diagnosis present

## 2019-03-07 DIAGNOSIS — Z882 Allergy status to sulfonamides status: Secondary | ICD-10-CM

## 2019-03-07 DIAGNOSIS — Z91040 Latex allergy status: Secondary | ICD-10-CM

## 2019-03-07 DIAGNOSIS — E875 Hyperkalemia: Secondary | ICD-10-CM | POA: Diagnosis present

## 2019-03-07 DIAGNOSIS — I96 Gangrene, not elsewhere classified: Secondary | ICD-10-CM | POA: Diagnosis present

## 2019-03-07 DIAGNOSIS — N184 Chronic kidney disease, stage 4 (severe): Secondary | ICD-10-CM

## 2019-03-07 DIAGNOSIS — N185 Chronic kidney disease, stage 5: Secondary | ICD-10-CM | POA: Diagnosis present

## 2019-03-07 DIAGNOSIS — D631 Anemia in chronic kidney disease: Secondary | ICD-10-CM | POA: Diagnosis present

## 2019-03-07 DIAGNOSIS — Z950 Presence of cardiac pacemaker: Secondary | ICD-10-CM

## 2019-03-07 DIAGNOSIS — Z95810 Presence of automatic (implantable) cardiac defibrillator: Secondary | ICD-10-CM

## 2019-03-07 DIAGNOSIS — L8990 Pressure ulcer of unspecified site, unspecified stage: Secondary | ICD-10-CM | POA: Insufficient documentation

## 2019-03-07 DIAGNOSIS — Z794 Long term (current) use of insulin: Secondary | ICD-10-CM

## 2019-03-07 DIAGNOSIS — T45515A Adverse effect of anticoagulants, initial encounter: Secondary | ICD-10-CM | POA: Diagnosis present

## 2019-03-07 DIAGNOSIS — E874 Mixed disorder of acid-base balance: Secondary | ICD-10-CM | POA: Diagnosis not present

## 2019-03-07 DIAGNOSIS — D126 Benign neoplasm of colon, unspecified: Secondary | ICD-10-CM

## 2019-03-07 DIAGNOSIS — M109 Gout, unspecified: Secondary | ICD-10-CM | POA: Diagnosis present

## 2019-03-07 DIAGNOSIS — I959 Hypotension, unspecified: Secondary | ICD-10-CM | POA: Diagnosis not present

## 2019-03-07 DIAGNOSIS — D696 Thrombocytopenia, unspecified: Secondary | ICD-10-CM | POA: Diagnosis present

## 2019-03-07 DIAGNOSIS — R1013 Epigastric pain: Secondary | ICD-10-CM

## 2019-03-07 DIAGNOSIS — K5900 Constipation, unspecified: Secondary | ICD-10-CM | POA: Diagnosis present

## 2019-03-07 DIAGNOSIS — Z515 Encounter for palliative care: Secondary | ICD-10-CM | POA: Diagnosis not present

## 2019-03-07 DIAGNOSIS — S301XXA Contusion of abdominal wall, initial encounter: Secondary | ICD-10-CM | POA: Diagnosis present

## 2019-03-07 DIAGNOSIS — I5082 Biventricular heart failure: Secondary | ICD-10-CM | POA: Diagnosis present

## 2019-03-07 DIAGNOSIS — I5023 Acute on chronic systolic (congestive) heart failure: Secondary | ICD-10-CM | POA: Diagnosis present

## 2019-03-07 DIAGNOSIS — Z833 Family history of diabetes mellitus: Secondary | ICD-10-CM

## 2019-03-07 DIAGNOSIS — I639 Cerebral infarction, unspecified: Secondary | ICD-10-CM

## 2019-03-07 DIAGNOSIS — J9601 Acute respiratory failure with hypoxia: Secondary | ICD-10-CM | POA: Diagnosis not present

## 2019-03-07 DIAGNOSIS — E1122 Type 2 diabetes mellitus with diabetic chronic kidney disease: Secondary | ICD-10-CM | POA: Diagnosis present

## 2019-03-07 DIAGNOSIS — M79604 Pain in right leg: Secondary | ICD-10-CM | POA: Diagnosis not present

## 2019-03-07 DIAGNOSIS — C9000 Multiple myeloma not having achieved remission: Secondary | ICD-10-CM | POA: Diagnosis present

## 2019-03-07 DIAGNOSIS — I5022 Chronic systolic (congestive) heart failure: Secondary | ICD-10-CM

## 2019-03-07 DIAGNOSIS — Z5181 Encounter for therapeutic drug level monitoring: Secondary | ICD-10-CM

## 2019-03-07 DIAGNOSIS — N186 End stage renal disease: Secondary | ICD-10-CM

## 2019-03-07 DIAGNOSIS — N2581 Secondary hyperparathyroidism of renal origin: Secondary | ICD-10-CM | POA: Diagnosis present

## 2019-03-07 DIAGNOSIS — R0602 Shortness of breath: Secondary | ICD-10-CM

## 2019-03-07 DIAGNOSIS — R531 Weakness: Secondary | ICD-10-CM

## 2019-03-07 DIAGNOSIS — Z7901 Long term (current) use of anticoagulants: Secondary | ICD-10-CM

## 2019-03-07 DIAGNOSIS — I77810 Thoracic aortic ectasia: Secondary | ICD-10-CM | POA: Diagnosis present

## 2019-03-07 DIAGNOSIS — K7460 Unspecified cirrhosis of liver: Secondary | ICD-10-CM | POA: Diagnosis present

## 2019-03-07 DIAGNOSIS — I482 Chronic atrial fibrillation, unspecified: Secondary | ICD-10-CM | POA: Diagnosis present

## 2019-03-07 DIAGNOSIS — E669 Obesity, unspecified: Secondary | ICD-10-CM | POA: Diagnosis present

## 2019-03-07 DIAGNOSIS — I4891 Unspecified atrial fibrillation: Secondary | ICD-10-CM | POA: Diagnosis present

## 2019-03-07 DIAGNOSIS — E871 Hypo-osmolality and hyponatremia: Secondary | ICD-10-CM | POA: Diagnosis present

## 2019-03-07 DIAGNOSIS — Z880 Allergy status to penicillin: Secondary | ICD-10-CM

## 2019-03-07 DIAGNOSIS — I071 Rheumatic tricuspid insufficiency: Secondary | ICD-10-CM | POA: Diagnosis present

## 2019-03-07 DIAGNOSIS — K219 Gastro-esophageal reflux disease without esophagitis: Secondary | ICD-10-CM | POA: Diagnosis present

## 2019-03-07 DIAGNOSIS — Z87442 Personal history of urinary calculi: Secondary | ICD-10-CM

## 2019-03-07 DIAGNOSIS — Z7189 Other specified counseling: Secondary | ICD-10-CM

## 2019-03-07 DIAGNOSIS — R109 Unspecified abdominal pain: Secondary | ICD-10-CM

## 2019-03-07 DIAGNOSIS — I442 Atrioventricular block, complete: Secondary | ICD-10-CM | POA: Diagnosis present

## 2019-03-07 DIAGNOSIS — N281 Cyst of kidney, acquired: Secondary | ICD-10-CM | POA: Diagnosis present

## 2019-03-07 DIAGNOSIS — Y848 Other medical procedures as the cause of abnormal reaction of the patient, or of later complication, without mention of misadventure at the time of the procedure: Secondary | ICD-10-CM | POA: Diagnosis present

## 2019-03-07 DIAGNOSIS — I428 Other cardiomyopathies: Secondary | ICD-10-CM | POA: Diagnosis present

## 2019-03-07 DIAGNOSIS — I272 Pulmonary hypertension, unspecified: Secondary | ICD-10-CM | POA: Diagnosis present

## 2019-03-07 DIAGNOSIS — R54 Age-related physical debility: Secondary | ICD-10-CM | POA: Diagnosis present

## 2019-03-07 DIAGNOSIS — E11649 Type 2 diabetes mellitus with hypoglycemia without coma: Secondary | ICD-10-CM | POA: Diagnosis not present

## 2019-03-07 DIAGNOSIS — Q246 Congenital heart block: Secondary | ICD-10-CM

## 2019-03-07 DIAGNOSIS — I509 Heart failure, unspecified: Secondary | ICD-10-CM

## 2019-03-07 DIAGNOSIS — Z79899 Other long term (current) drug therapy: Secondary | ICD-10-CM

## 2019-03-07 DIAGNOSIS — Z6833 Body mass index (BMI) 33.0-33.9, adult: Secondary | ICD-10-CM

## 2019-03-07 DIAGNOSIS — B9681 Helicobacter pylori [H. pylori] as the cause of diseases classified elsewhere: Secondary | ICD-10-CM

## 2019-03-07 LAB — HEPATIC FUNCTION PANEL
ALT: 17 U/L (ref 0–44)
AST: 23 U/L (ref 15–41)
Albumin: 2.9 g/dL — ABNORMAL LOW (ref 3.5–5.0)
Alkaline Phosphatase: 324 U/L — ABNORMAL HIGH (ref 38–126)
Bilirubin, Direct: 1.7 mg/dL — ABNORMAL HIGH (ref 0.0–0.2)
Indirect Bilirubin: 1.3 mg/dL — ABNORMAL HIGH (ref 0.3–0.9)
Total Bilirubin: 3 mg/dL — ABNORMAL HIGH (ref 0.3–1.2)
Total Protein: 7.7 g/dL (ref 6.5–8.1)

## 2019-03-07 LAB — CBC WITH DIFFERENTIAL/PLATELET
Abs Immature Granulocytes: 0.03 10*3/uL (ref 0.00–0.07)
Basophils Absolute: 0 10*3/uL (ref 0.0–0.1)
Basophils Relative: 0 %
Eosinophils Absolute: 0.2 10*3/uL (ref 0.0–0.5)
Eosinophils Relative: 2 %
HCT: 33.3 % — ABNORMAL LOW (ref 36.0–46.0)
Hemoglobin: 10.9 g/dL — ABNORMAL LOW (ref 12.0–15.0)
Immature Granulocytes: 0 %
Lymphocytes Relative: 8 %
Lymphs Abs: 0.6 10*3/uL — ABNORMAL LOW (ref 0.7–4.0)
MCH: 27.8 pg (ref 26.0–34.0)
MCHC: 32.7 g/dL (ref 30.0–36.0)
MCV: 84.9 fL (ref 80.0–100.0)
Monocytes Absolute: 0.5 10*3/uL (ref 0.1–1.0)
Monocytes Relative: 8 %
Neutro Abs: 5.5 10*3/uL (ref 1.7–7.7)
Neutrophils Relative %: 82 %
Platelets: 139 10*3/uL — ABNORMAL LOW (ref 150–400)
RBC: 3.92 MIL/uL (ref 3.87–5.11)
RDW: 16.9 % — ABNORMAL HIGH (ref 11.5–15.5)
WBC: 6.8 10*3/uL (ref 4.0–10.5)
nRBC: 0.7 % — ABNORMAL HIGH (ref 0.0–0.2)

## 2019-03-07 LAB — RESPIRATORY PANEL BY RT PCR (FLU A&B, COVID)
Influenza A by PCR: NEGATIVE
Influenza B by PCR: NEGATIVE
SARS Coronavirus 2 by RT PCR: NEGATIVE

## 2019-03-07 LAB — PROTIME-INR
INR: 3 — ABNORMAL HIGH (ref 0.8–1.2)
Prothrombin Time: 31.3 seconds — ABNORMAL HIGH (ref 11.4–15.2)

## 2019-03-07 LAB — DIGOXIN LEVEL: Digoxin Level: 1.5 ng/mL (ref 0.8–2.0)

## 2019-03-07 LAB — BASIC METABOLIC PANEL
Anion gap: 13 (ref 5–15)
BUN: 118 mg/dL — ABNORMAL HIGH (ref 6–20)
CO2: 20 mmol/L — ABNORMAL LOW (ref 22–32)
Calcium: 8.4 mg/dL — ABNORMAL LOW (ref 8.9–10.3)
Chloride: 94 mmol/L — ABNORMAL LOW (ref 98–111)
Creatinine, Ser: 5.04 mg/dL — ABNORMAL HIGH (ref 0.44–1.00)
GFR calc Af Amer: 10 mL/min — ABNORMAL LOW (ref >60)
GFR calc non Af Amer: 9 mL/min — ABNORMAL LOW (ref >60)
Glucose, Bld: 77 mg/dL (ref 70–99)
Potassium: 5.3 mmol/L — ABNORMAL HIGH (ref 3.5–5.1)
Sodium: 127 mmol/L — ABNORMAL LOW (ref 135–145)

## 2019-03-07 LAB — BRAIN NATRIURETIC PEPTIDE: B Natriuretic Peptide: 1072 pg/mL — ABNORMAL HIGH (ref 0.0–100.0)

## 2019-03-07 LAB — TROPONIN I (HIGH SENSITIVITY): Troponin I (High Sensitivity): 28 ng/L — ABNORMAL HIGH (ref <18)

## 2019-03-07 MED ORDER — FUROSEMIDE 10 MG/ML IJ SOLN
40.0000 mg | Freq: Once | INTRAMUSCULAR | Status: AC
  Administered 2019-03-07: 40 mg via INTRAVENOUS
  Filled 2019-03-07: qty 4

## 2019-03-07 NOTE — ED Provider Notes (Addendum)
[facility] EMERGENCY DEPARTMENT Provider Note   CSN: [id number] Arrival date & time: 03/07/19  1902     History Chief Complaint  Patient presents with  . Leg Swelling    pain/shortnes of breath    [name] is a 59 y.o. female.  Patient complains of increased swelling in her legs and her abdomen.  She states she has gained 30 pounds.  Patient has significant just of heart failure   Weakness Severity:  Moderate Onset quality:  Sudden Timing:  Constant Progression:  Worsening Chronicity:  New Context: not alcohol use   Relieved by:  Nothing Worsened by:  Nothing Ineffective treatments:  Medication Associated symptoms: no abdominal pain, no chest pain, no cough, no diarrhea, no frequency, no headaches and no seizures        Past Medical History:  Diagnosis Date  . Automatic implantable cardioverter-defibrillator in situ    a. 03/2013: BSX Energen CRTD BiV ICC, ser# [id number]  . Chronic anticoagulation    a. coumadin.  . Chronic atrial fibrillation (HCC)    embolic rind  . Chronic kidney disease    creatinine- 1.8 in 09/2006 and 2.0 in 05/2007; 2.05 in 2011, 2.29 in 2012  . Chronic systolic CHF (congestive heart failure) ([place])    a. 03/2013 Echo: Sev LV dysfxn with sev diff HK, restrictive phys, diast dysfxn, mild MR, sev dil LA/RA, mod PR, PASP 39mmHg.  . Congenital third degree heart block    a. Guidant VVI pacemaker implanted in 09/1997; b. gen change in 01/2004;  c. 03/2013 upgrade to [place], ser# [id number].  . Dizziness and giddiness 05/19/2012  . Dysphagia 08/14/2011   FEB 2013 EGD/DIL 16 MM; GERD; h/o gastroesophageal reflux disease; + H. Pylori gastritis   . GERD (gastroesophageal reflux disease)   . Helicobacter pylori gastritis 08/14/2011  . IDDM (insulin dependent diabetes mellitus)   . Kidney stones 1990's  . Nonischemic cardiomyopathy ([place])    a. 03/2013 Echo: Sev LV dysfxn with sev diff HK, restrictive phys, diast dysfxn, mild MR, sev dil  LA/RA, mod PR, PASP 80mmHg;  b. 03/2013: BSX Energen CRTD BiV ICC, ser# [id number].  Marland Kitchen Potassium (K) excess   . Stroke [facility]) 1999   denies residual on 04/21/2013    Patient Active Problem List   Diagnosis Date Noted  . Thrombocytopenia ([place]) 02/04/2019  . Hepatic cirrhosis ([place]) 01/14/2019  . Helicobacter pylori gastritis 10/31/2016  . Colon adenomas 06/15/2015  . Biventricular ICD (implantable cardioverter-defibrillator) in place 05/21/2013  . Other and unspecified hyperlipidemia 04/11/2013  . Chronic systolic heart failure ([place]) 04/07/2013  . Encounter for therapeutic drug monitoring 03/29/2013  . Chronic kidney disease, stage IV (severe) ([place]) 01/27/2013  . Dyspepsia 09/10/2012  . Gout 05/15/2012  . Chronic anticoagulation   . Congenital third degree heart block   . 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 Cardiac pacemaker in situ 04/24/2009  . 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 CARDIOMYOPATHY 01/20/2008  . ATRIAL FIBRILLATION, CHRONIC 01/20/2008  . Cardiorenal syndrome/chronic kidney disease 01/20/2008    Past Surgical History:  Procedure Laterality Date  . BI-VENTRICULAR IMPLANTABLE CARDIOVERTER DEFIBRILLATOR  (CRT-D)  04/21/2013  . BI-VENTRICULAR IMPLANTABLE CARDIOVERTER DEFIBRILLATOR UPGRADE N/A 04/21/2013   Procedure: BI-VENTRICULAR IMPLANTABLE CARDIOVERTER DEFIBRILLATOR UPGRADE;  Surgeon: Evans Lance, MD;  Location: [facility] CATH LAB;  Service: Cardiovascular;  Laterality: N/A;  . COLONOSCOPY  04/23/2011   DGL:OVFIEPPIRJ COLON POLYP/ INTERNAL HEMORRHOIDS/ TORTUOUS COLON  .  INSERT / REPLACE / REMOVE PACEMAKER  09/1997   Guidant VVI [employer]  . PACEMAKER GENERATOR CHANGE  01/2004  . UPPER GASTROINTESTINAL ENDOSCOPY  FEB 2013   RING/DIL TO 16 MM, H PYLORI GASTRITIS     OB History    Gravida  4   Para      Term      Preterm      AB      Living  4     SAB      TAB       Ectopic      Multiple      Live Births              Family History  Adopted: Yes  Problem Relation Age of Onset  . Autism Son   . Seizures Son   . Diabetes Daughter   . Colon cancer Neg Hx   . Colon polyps Neg Hx     Social History   Tobacco Use  . Smoking status: Never Smoker  . Smokeless tobacco: Never Used  . Tobacco comment: Never smoked  Substance Use Topics  . Alcohol use: No    Alcohol/week: 0.0 standard drinks  . Drug use: No    Home Medications Prior to Admission medications   Medication Sig Start Date End Date Taking? Authorizing Provider  ACCU-CHEK GUIDE test strip 1 each by Other route 4 (four) times daily.  02/25/19   [provider]  acetaminophen (TYLENOL) 500 MG tablet Take 500 mg by mouth every 6 (six) hours as needed for moderate pain.    [provider]  Alcohol Swabs (ALCOHOL PREP) PADS 1 each by Does not apply route daily. 02/11/18   Elayne Snare, MD  allopurinol (ZYLOPRIM) 100 MG tablet Take 150 mg by mouth every morning. Reported on 06/15/2015    [provider]  benazepril (LOTENSIN) 10 MG tablet Take 10 mg by mouth daily.    [provider]  calcitRIOL (ROCALTROL) 0.25 MCG capsule Take 0.5 mcg by mouth daily. Take 2 tablets by mouth once daily.    [provider]  cholecalciferol (VITAMIN D3) 25 MCG (1000 UT) tablet Take 1,000 Units by mouth daily.    [provider]  COREG CR 40 MG 24 hr capsule TAKE ONE CAPSULE BY MOUTH DAILY. 11/24/17   Evans Lance, MD  diclofenac Sodium (VOLTAREN) 1 % GEL APPLY TO AFFECTED AREASETWICE DAILY. 02/23/19   [provider]  digoxin (LANOXIN) 0.125 MG tablet TAKE 1 TABLET BY MOUTH DAILY EXCEPT ON SATURDAY AND SUNDAY. Patient taking differently: Take 0.125 mg by mouth daily. Except on Saturday and Sunday 11/27/18   Herminio Commons, MD  insulin NPH Human (HUMULIN N) 100 UNIT/ML injection Inject 0.34 mLs (34 Units total) into the skin daily. Patient  taking differently: Inject 22 Units into the skin daily.  07/22/18   Elayne Snare, MD  Insulin Pen Needle 31G X 5 MM MISC 1 each by Does not apply route daily. Use as instructed to check blood sugar 4 times daily. 02/11/18   Elayne Snare, MD  Insulin Syringe-Needle U-100 30G 1 ML MISC 1 each by Does not apply route daily. Use needle to inject insulin 3 times daily. 02/13/18   Elayne Snare, MD  LITETOUCH PEN NEEDLES 31G X 8 MM MISC USE AS DIRECTED UP TO 4 TIMES DAILY. Patient taking differently: See admin instructions.  01/27/19   Elayne Snare, MD  meclizine (ANTIVERT) 12.5 MG tablet Take 12.5 mg  by mouth 3 (three) times daily as needed. 02/11/19   [provider]  metolazone (ZAROXOLYN) 2.5 MG tablet Take 2.5 mg by mouth daily as needed. 02/01/19   [provider]  NOVOLOG FLEXPEN 100 UNIT/ML FlexPen INJECT SUBCUTANEOUSLY 12 UNITS BEFORE BREAKFAST; 14 UNITS AT LUNCH; AND 16 UNITS AT DINNER. Patient taking differently: See admin instructions. INJECT SUBCUTANEOUSLY 12 UNITS BEFORE BREAKFAST; 14 UNITS AT LUNCH; AND 16 UNITS AT [facility] 01/27/19   Elayne Snare, MD  SURE COMFORT INSULIN SYRINGE 31G X 5/16" 0.5 ML MISC INJECT ONCE DAILY AS DIRECTED. Patient taking differently: See admin instructions. INJECT ONCE DAILY AS DIRECTED. 01/27/19   Elayne Snare, MD  torsemide (DEMADEX) 20 MG tablet Take 1 tablet (20 mg total) by mouth 2 (two) times daily. 03/05/19 06/03/19  Herminio Commons, MD  traMADol (ULTRAM) 50 MG tablet Take 1 tablet (50 mg total) by mouth 2 (two) times daily as needed. 03/03/19   Derek Jack, MD  TRESIBA FLEXTOUCH 200 UNIT/ML SOPN INJECT 15 UNITS INTO THE SKIN DAILY BEFORE BREAKFAST. Patient taking differently: Inject 56 Units into the skin daily before breakfast.  10/25/18   Elayne Snare, MD  VICTOZA 18 MG/3ML SOPN INJECT 1.2MG  SUBCUTANEOUSLY DAILY AS DIRECTED. Patient taking differently: Inject 1.2 mg into the skin daily. INJECT 1.2MG  SUBCUTANEOUSLY DAILY AS DIRECTED. 01/27/19    Elayne Snare, MD  warfarin (COUMADIN) 5 MG tablet TAKE 1/2 DAILY EXCEPT ON WEDNESDAY TAKE 1 TABLET. Patient taking differently: Take 2.5 mg by mouth daily. TAKE 1/2 DAILY. 06/30/18   Herminio Commons, MD    Allergies    Wheat, Latex, Penicillins, and Sulfa antibiotics  Review of Systems   Review of Systems  Constitutional: Negative for appetite change and fatigue.  HENT: Negative for congestion, ear discharge and sinus pressure.   Eyes: Negative for discharge.  Respiratory: Negative for cough.   Cardiovascular: Negative for chest pain.  Gastrointestinal: Negative for abdominal pain and diarrhea.  Genitourinary: Negative for frequency and hematuria.  Musculoskeletal: Negative for back pain.       Swelling in her legs and abdomen  Skin: Negative for rash.  Neurological: Positive for weakness. Negative for seizures and headaches.  Psychiatric/Behavioral: Negative for hallucinations.    Physical Exam Updated Vital Signs BP 96/61 (BP Location: Right Arm)   Pulse (!) 59   Temp 97.7 F (36.5 C) (Oral)   Resp 18   Ht 5\' 5"  (1.651 m)   Wt 90.7 kg   SpO2 97%   BMI 33.28 kg/m   Physical Exam Vitals and nursing note reviewed.  Constitutional:      Appearance: She is well-developed.  HENT:     Head: Normocephalic.     Nose: Nose normal.  Eyes:     General: No scleral icterus.    Conjunctiva/sclera: Conjunctivae normal.  Neck:     Thyroid: No thyromegaly.  Cardiovascular:     Rate and Rhythm: Normal rate and regular rhythm.     Heart sounds: No murmur. No friction rub. No gallop.   Pulmonary:     Breath sounds: No stridor. No wheezing or rales.  Chest:     Chest wall: No tenderness.  Abdominal:     General: There is no distension.     Tenderness: There is no abdominal tenderness. There is no rebound.  Musculoskeletal:        General: Normal range of motion.     Cervical back: Neck supple.     Comments: 3+ edema in her legs  all the way up to her lower abdomen    Lymphadenopathy:     Cervical: No cervical adenopathy.  Skin:    Findings: No erythema or rash.  Neurological:     Mental Status: She is alert and oriented to person, place, and time.     Motor: No abnormal muscle tone.     Coordination: Coordination normal.  Psychiatric:        Behavior: Behavior normal.     ED Results / Procedures / Treatments   Labs (all labs ordered are listed, but only abnormal results are displayed) Labs Reviewed  CBC WITH DIFFERENTIAL/PLATELET - Abnormal; Notable for the following components:      Result Value   Hemoglobin 10.9 (*)    HCT 33.3 (*)    RDW 16.9 (*)    Platelets 139 (*)    nRBC 0.7 (*)    Lymphs Abs 0.6 (*)    All other components within normal limits  BASIC METABOLIC PANEL - Abnormal; Notable for the following components:   Sodium 127 (*)    Potassium 5.3 (*)    Chloride 94 (*)    CO2 20 (*)    BUN 118 (*)    Creatinine, Ser 5.04 (*)    Calcium 8.4 (*)    GFR calc non Af Amer 9 (*)    GFR calc Af Amer 10 (*)    All other components within normal limits  HEPATIC FUNCTION PANEL - Abnormal; Notable for the following components:   Albumin 2.9 (*)    Alkaline Phosphatase 324 (*)    Total Bilirubin 3.0 (*)    Bilirubin, Direct 1.7 (*)    Indirect Bilirubin 1.3 (*)    All other components within normal limits  BRAIN NATRIURETIC PEPTIDE - Abnormal; Notable for the following components:   B Natriuretic Peptide 1,072.0 (*)    All other components within normal limits  PROTIME-INR - Abnormal; Notable for the following components:   Prothrombin Time 31.3 (*)    INR 3.0 (*)    All other components within normal limits  TROPONIN I (HIGH SENSITIVITY) - Abnormal; Notable for the following components:   Troponin I (High Sensitivity) 28 (*)    All other components within normal limits  RESPIRATORY PANEL BY RT PCR (FLU A&B, COVID)  DIGOXIN LEVEL  TROPONIN I (HIGH SENSITIVITY)    EKG EKG Interpretation  Date/Time:  Sunday March 07 2019 20:35:33 EST Ventricular Rate:  60 PR Interval:    QRS Duration: 203 QT Interval:  471 QTC Calculation: 471 R Axis:   167 Text Interpretation: Sinus rhythm Prolonged PR interval IVCD, consider atypical RBBB LVH with secondary repolarization abnormality Baseline wander in lead(s) III V1 Confirmed by Milton Ferguson [phone]) on 03/07/2019 10:24:37 PM   Radiology DG Chest Portable 1 View  Result Date: 03/07/2019 CLINICAL DATA:  Shortness of breath, weakness EXAM: PORTABLE CHEST 1 VIEW COMPARISON:  06/25/2017 FINDINGS: Left AICD remains in place, unchanged. Cardiomegaly. No confluent opacities or effusions. No edema. No acute bony abnormality. IMPRESSION: Cardiomegaly.  No acute cardiopulmonary disease. Electronically Signed   By: Rolm Baptise M.D.   On: 03/07/2019 21:04    Procedures Procedures (including critical care time)  Medications Ordered in ED Medications  furosemide (LASIX) injection 40 mg (40 mg Intravenous Given 03/07/19 2102)    ED Course  I have reviewed the triage vital signs and the nursing notes.  Pertinent labs & imaging results that were available during my care of the patient were  reviewed by me and considered in my medical decision making (see chart for details). CRITICAL CARE Performed by: Milton Ferguson Total critical care time: 33minutes Critical care time was exclusive of separately billable procedures and treating other patients. Critical care was necessary to treat or prevent imminent or life-threatening deterioration. Critical care was time spent personally by me on the following activities: development of treatment plan with patient and/or surrogate as well as nursing, discussions with consultants, evaluation of patient's response to treatment, examination of patient, obtaining history from patient or surrogate, ordering and performing treatments and interventions, ordering and review of laboratory studies, ordering and review of radiographic studies, pulse  oximetry and re-evaluation of patient's condition.    MDM Rules/Calculators/A&P                      Patient with congestive heart failure and anasarca.  She will be admitted to medicine with cardiology consult.   Cardiology was consulted and agrees with patient being admitted over at [facility] when there is a bed available final Clinical Impression(s) / ED Diagnoses Final diagnoses:  None    Rx / DC Orders ED Discharge Orders    None       Milton Ferguson, MD 03/07/19 2252    Milton Ferguson, MD 03/07/19 2323

## 2019-03-07 NOTE — Progress Notes (Signed)
Cardiology Moonlighter Note  Returned page from [facility] regarding this patient, who is currently at [facility] ED with leg swelling, weight gain, and AKI (creatinine now 5.0). Patient accepted for transfer to cardiology service at [facility], though no beds available at this time. [facility] and bed control working on finding bed for patient. She will need to be seen by heart failure service. May need inotropes given severity of AKI and discussion of advanced therapies.   Marcie Mowers, MD Cardiology Fellow, PGY-7

## 2019-03-07 NOTE — ED Triage Notes (Signed)
Patient states that she has had bilateral leg swelling that has gotten worse over the last few weeks. Patient states that she has had a 30 lb weight gain within tthe last 5 weeks. Patient also states shortness of breath. Patient states that her platelets were low a week ago. Patient is having bilateral leg pain.

## 2019-03-08 ENCOUNTER — Inpatient Hospital Stay: Payer: Self-pay

## 2019-03-08 ENCOUNTER — Emergency Department (HOSPITAL_COMMUNITY): Payer: Medicaid Other

## 2019-03-08 DIAGNOSIS — I442 Atrioventricular block, complete: Secondary | ICD-10-CM | POA: Diagnosis not present

## 2019-03-08 DIAGNOSIS — N189 Chronic kidney disease, unspecified: Secondary | ICD-10-CM | POA: Diagnosis not present

## 2019-03-08 DIAGNOSIS — E1122 Type 2 diabetes mellitus with diabetic chronic kidney disease: Secondary | ICD-10-CM | POA: Diagnosis not present

## 2019-03-08 DIAGNOSIS — N184 Chronic kidney disease, stage 4 (severe): Secondary | ICD-10-CM | POA: Diagnosis not present

## 2019-03-08 DIAGNOSIS — R609 Edema, unspecified: Secondary | ICD-10-CM | POA: Diagnosis not present

## 2019-03-08 DIAGNOSIS — E875 Hyperkalemia: Secondary | ICD-10-CM | POA: Diagnosis not present

## 2019-03-08 DIAGNOSIS — I96 Gangrene, not elsewhere classified: Secondary | ICD-10-CM | POA: Diagnosis not present

## 2019-03-08 DIAGNOSIS — E11649 Type 2 diabetes mellitus with hypoglycemia without coma: Secondary | ICD-10-CM | POA: Diagnosis not present

## 2019-03-08 DIAGNOSIS — E874 Mixed disorder of acid-base balance: Secondary | ICD-10-CM | POA: Diagnosis not present

## 2019-03-08 DIAGNOSIS — M79604 Pain in right leg: Secondary | ICD-10-CM | POA: Diagnosis not present

## 2019-03-08 DIAGNOSIS — I371 Nonrheumatic pulmonary valve insufficiency: Secondary | ICD-10-CM | POA: Diagnosis not present

## 2019-03-08 DIAGNOSIS — I361 Nonrheumatic tricuspid (valve) insufficiency: Secondary | ICD-10-CM | POA: Diagnosis not present

## 2019-03-08 DIAGNOSIS — I5023 Acute on chronic systolic (congestive) heart failure: Secondary | ICD-10-CM

## 2019-03-08 DIAGNOSIS — Z515 Encounter for palliative care: Secondary | ICD-10-CM | POA: Diagnosis not present

## 2019-03-08 DIAGNOSIS — N2581 Secondary hyperparathyroidism of renal origin: Secondary | ICD-10-CM | POA: Diagnosis not present

## 2019-03-08 DIAGNOSIS — R531 Weakness: Secondary | ICD-10-CM | POA: Diagnosis not present

## 2019-03-08 DIAGNOSIS — N183 Chronic kidney disease, stage 3 unspecified: Secondary | ICD-10-CM | POA: Diagnosis not present

## 2019-03-08 DIAGNOSIS — I5082 Biventricular heart failure: Secondary | ICD-10-CM | POA: Diagnosis not present

## 2019-03-08 DIAGNOSIS — R601 Generalized edema: Secondary | ICD-10-CM | POA: Diagnosis not present

## 2019-03-08 DIAGNOSIS — K7460 Unspecified cirrhosis of liver: Secondary | ICD-10-CM | POA: Diagnosis not present

## 2019-03-08 DIAGNOSIS — I071 Rheumatic tricuspid insufficiency: Secondary | ICD-10-CM | POA: Diagnosis not present

## 2019-03-08 DIAGNOSIS — J9601 Acute respiratory failure with hypoxia: Secondary | ICD-10-CM | POA: Diagnosis not present

## 2019-03-08 DIAGNOSIS — I509 Heart failure, unspecified: Secondary | ICD-10-CM | POA: Diagnosis not present

## 2019-03-08 DIAGNOSIS — Z20822 Contact with and (suspected) exposure to covid-19: Secondary | ICD-10-CM | POA: Diagnosis not present

## 2019-03-08 DIAGNOSIS — D696 Thrombocytopenia, unspecified: Secondary | ICD-10-CM | POA: Diagnosis not present

## 2019-03-08 DIAGNOSIS — Y848 Other medical procedures as the cause of abnormal reaction of the patient, or of later complication, without mention of misadventure at the time of the procedure: Secondary | ICD-10-CM | POA: Diagnosis not present

## 2019-03-08 DIAGNOSIS — N1832 Chronic kidney disease, stage 3b: Secondary | ICD-10-CM | POA: Diagnosis not present

## 2019-03-08 DIAGNOSIS — E871 Hypo-osmolality and hyponatremia: Secondary | ICD-10-CM | POA: Diagnosis not present

## 2019-03-08 DIAGNOSIS — I482 Chronic atrial fibrillation, unspecified: Secondary | ICD-10-CM | POA: Diagnosis not present

## 2019-03-08 DIAGNOSIS — D472 Monoclonal gammopathy: Secondary | ICD-10-CM | POA: Diagnosis not present

## 2019-03-08 DIAGNOSIS — I428 Other cardiomyopathies: Secondary | ICD-10-CM | POA: Diagnosis not present

## 2019-03-08 DIAGNOSIS — N179 Acute kidney failure, unspecified: Secondary | ICD-10-CM | POA: Diagnosis present

## 2019-03-08 DIAGNOSIS — N186 End stage renal disease: Secondary | ICD-10-CM | POA: Diagnosis not present

## 2019-03-08 DIAGNOSIS — E669 Obesity, unspecified: Secondary | ICD-10-CM | POA: Diagnosis not present

## 2019-03-08 DIAGNOSIS — Q246 Congenital heart block: Secondary | ICD-10-CM | POA: Diagnosis not present

## 2019-03-08 DIAGNOSIS — C9000 Multiple myeloma not having achieved remission: Secondary | ICD-10-CM | POA: Diagnosis not present

## 2019-03-08 DIAGNOSIS — D649 Anemia, unspecified: Secondary | ICD-10-CM | POA: Diagnosis not present

## 2019-03-08 DIAGNOSIS — N170 Acute kidney failure with tubular necrosis: Secondary | ICD-10-CM | POA: Diagnosis not present

## 2019-03-08 DIAGNOSIS — I132 Hypertensive heart and chronic kidney disease with heart failure and with stage 5 chronic kidney disease, or end stage renal disease: Secondary | ICD-10-CM | POA: Diagnosis present

## 2019-03-08 LAB — CBC
HCT: 32.8 % — ABNORMAL LOW (ref 36.0–46.0)
Hemoglobin: 10.8 g/dL — ABNORMAL LOW (ref 12.0–15.0)
MCH: 27.6 pg (ref 26.0–34.0)
MCHC: 32.9 g/dL (ref 30.0–36.0)
MCV: 83.7 fL (ref 80.0–100.0)
Platelets: 123 10*3/uL — ABNORMAL LOW (ref 150–400)
RBC: 3.92 MIL/uL (ref 3.87–5.11)
RDW: 16.9 % — ABNORMAL HIGH (ref 11.5–15.5)
WBC: 7.2 10*3/uL (ref 4.0–10.5)
nRBC: 0.6 % — ABNORMAL HIGH (ref 0.0–0.2)

## 2019-03-08 LAB — UPEP/UIFE/LIGHT CHAINS/TP, 24-HR UR
% BETA, Urine: 14.5 %
ALPHA 1 URINE: 7.8 %
Albumin, U: 34.2 %
Alpha 2, Urine: 5.6 %
Free Kappa Lt Chains,Ur: 260.46 mg/L — ABNORMAL HIGH (ref 0.63–113.79)
Free Kappa/Lambda Ratio: 136.37 — ABNORMAL HIGH (ref 1.03–31.76)
Free Lambda Lt Chains,Ur: 1.91 mg/L (ref 0.47–11.77)
GAMMA GLOBULIN URINE: 37.9 %
M-SPIKE %, Urine: 28 % — ABNORMAL HIGH
M-Spike, Mg/24 Hr: 22 mg/24 hr — ABNORMAL HIGH
Total Protein, Urine-Ur/day: 77 mg/24 hr (ref 30–150)
Total Protein, Urine: 6.7 mg/dL
Total Volume: 1150

## 2019-03-08 LAB — COMPREHENSIVE METABOLIC PANEL
ALT: 16 U/L (ref 0–44)
AST: 24 U/L (ref 15–41)
Albumin: 2.7 g/dL — ABNORMAL LOW (ref 3.5–5.0)
Alkaline Phosphatase: 303 U/L — ABNORMAL HIGH (ref 38–126)
Anion gap: 16 — ABNORMAL HIGH (ref 5–15)
BUN: 120 mg/dL — ABNORMAL HIGH (ref 6–20)
CO2: 17 mmol/L — ABNORMAL LOW (ref 22–32)
Calcium: 7.9 mg/dL — ABNORMAL LOW (ref 8.9–10.3)
Chloride: 93 mmol/L — ABNORMAL LOW (ref 98–111)
Creatinine, Ser: 4.97 mg/dL — ABNORMAL HIGH (ref 0.44–1.00)
GFR calc Af Amer: 10 mL/min — ABNORMAL LOW (ref >60)
GFR calc non Af Amer: 9 mL/min — ABNORMAL LOW (ref >60)
Glucose, Bld: 49 mg/dL — ABNORMAL LOW (ref 70–99)
Potassium: 5.2 mmol/L — ABNORMAL HIGH (ref 3.5–5.1)
Sodium: 126 mmol/L — ABNORMAL LOW (ref 135–145)
Total Bilirubin: 3.3 mg/dL — ABNORMAL HIGH (ref 0.3–1.2)
Total Protein: 7.3 g/dL (ref 6.5–8.1)

## 2019-03-08 LAB — GLUCOSE, CAPILLARY
Glucose-Capillary: 223 mg/dL — ABNORMAL HIGH (ref 70–99)
Glucose-Capillary: 228 mg/dL — ABNORMAL HIGH (ref 70–99)

## 2019-03-08 LAB — PROTIME-INR
INR: 3.1 — ABNORMAL HIGH (ref 0.8–1.2)
Prothrombin Time: 31.7 seconds — ABNORMAL HIGH (ref 11.4–15.2)

## 2019-03-08 LAB — TROPONIN I (HIGH SENSITIVITY): Troponin I (High Sensitivity): 27 ng/L — ABNORMAL HIGH (ref <18)

## 2019-03-08 LAB — CBG MONITORING, ED: Glucose-Capillary: 65 mg/dL — ABNORMAL LOW (ref 70–99)

## 2019-03-08 LAB — HIV ANTIBODY (ROUTINE TESTING W REFLEX): HIV Screen 4th Generation wRfx: NONREACTIVE

## 2019-03-08 MED ORDER — INSULIN ASPART 100 UNIT/ML ~~LOC~~ SOLN
0.0000 [IU] | Freq: Every day | SUBCUTANEOUS | Status: DC
  Administered 2019-03-08: 2 [IU] via SUBCUTANEOUS

## 2019-03-08 MED ORDER — SODIUM CHLORIDE 0.9 % IV SOLN: 250.0000 mL | INTRAVENOUS | Status: DC | PRN

## 2019-03-08 MED ORDER — SODIUM CHLORIDE 0.9% FLUSH
3.0000 mL | Freq: Two times a day (BID) | INTRAVENOUS | Status: DC
  Administered 2019-03-08 – 2019-03-26 (×10): 3 mL via INTRAVENOUS

## 2019-03-08 MED ORDER — ACETAMINOPHEN 650 MG RE SUPP: 650.0000 mg | Freq: Four times a day (QID) | RECTAL | Status: DC | PRN

## 2019-03-08 MED ORDER — INSULIN ASPART 100 UNIT/ML ~~LOC~~ SOLN
0.0000 [IU] | Freq: Three times a day (TID) | SUBCUTANEOUS | Status: DC
  Administered 2019-03-08: 19:00:00 3 [IU] via SUBCUTANEOUS
  Administered 2019-03-09: 1 [IU] via SUBCUTANEOUS
  Administered 2019-03-10 (×3): 2 [IU] via SUBCUTANEOUS
  Administered 2019-03-11 (×2): 3 [IU] via SUBCUTANEOUS
  Administered 2019-03-11: 06:00:00 2 [IU] via SUBCUTANEOUS
  Administered 2019-03-12 (×2): 1 [IU] via SUBCUTANEOUS
  Administered 2019-03-13: 17:00:00 3 [IU] via SUBCUTANEOUS
  Administered 2019-03-13: 13:00:00 1 [IU] via SUBCUTANEOUS
  Administered 2019-03-13: 09:00:00 3 [IU] via SUBCUTANEOUS
  Administered 2019-03-14: 07:00:00 1 [IU] via SUBCUTANEOUS
  Administered 2019-03-14: 17:00:00 2 [IU] via SUBCUTANEOUS
  Administered 2019-03-14: 12:00:00 3 [IU] via SUBCUTANEOUS
  Administered 2019-03-15: 12:00:00 2 [IU] via SUBCUTANEOUS
  Administered 2019-03-15: 18:00:00 1 [IU] via SUBCUTANEOUS
  Administered 2019-03-16 – 2019-03-17 (×2): 2 [IU] via SUBCUTANEOUS
  Administered 2019-03-17: 12:00:00 3 [IU] via SUBCUTANEOUS
  Administered 2019-03-17 – 2019-03-18 (×2): 1 [IU] via SUBCUTANEOUS
  Administered 2019-03-18: 17:00:00 2 [IU] via SUBCUTANEOUS
  Administered 2019-03-19: 3 [IU] via SUBCUTANEOUS
  Administered 2019-03-19: 5 [IU] via SUBCUTANEOUS
  Administered 2019-03-19: 1 [IU] via SUBCUTANEOUS
  Administered 2019-03-20 (×2): 2 [IU] via SUBCUTANEOUS
  Administered 2019-03-20: 12:00:00 5 [IU] via SUBCUTANEOUS
  Administered 2019-03-21: 2 [IU] via SUBCUTANEOUS
  Administered 2019-03-21: 3 [IU] via SUBCUTANEOUS
  Administered 2019-03-21: 12:00:00 5 [IU] via SUBCUTANEOUS
  Administered 2019-03-22: 2 [IU] via SUBCUTANEOUS
  Administered 2019-03-22 – 2019-03-23 (×3): 5 [IU] via SUBCUTANEOUS
  Administered 2019-03-23 (×2): 7 [IU] via SUBCUTANEOUS

## 2019-03-08 MED ORDER — ONDANSETRON HCL 4 MG/2ML IJ SOLN: 4.0000 mg | Freq: Four times a day (QID) | INTRAMUSCULAR | Status: DC | PRN

## 2019-03-08 MED ORDER — DOBUTAMINE IN D5W 4-5 MG/ML-% IV SOLN
1.0000 ug/kg/min | INTRAVENOUS | Status: DC
  Administered 2019-03-08 – 2019-03-15 (×3): 2.5 ug/kg/min via INTRAVENOUS
  Filled 2019-03-08 (×3): qty 250

## 2019-03-08 MED ORDER — FUROSEMIDE 10 MG/ML IJ SOLN
160.0000 mg | Freq: Once | INTRAVENOUS | Status: AC
  Administered 2019-03-08: 11:00:00 160 mg via INTRAVENOUS
  Filled 2019-03-08: qty 16

## 2019-03-08 MED ORDER — SODIUM CHLORIDE 0.9% FLUSH: 3.0000 mL | INTRAVENOUS | Status: DC | PRN

## 2019-03-08 MED ORDER — INSULIN NPH (HUMAN) (ISOPHANE) 100 UNIT/ML ~~LOC~~ SUSP: 22.0000 [IU] | Freq: Every day | SUBCUTANEOUS | Status: DC

## 2019-03-08 MED ORDER — SODIUM CHLORIDE 0.9 % IV SOLN
250.0000 mL | INTRAVENOUS | Status: DC | PRN
  Administered 2019-03-08 – 2019-03-28 (×2): 250 mL via INTRAVENOUS

## 2019-03-08 MED ORDER — SODIUM CHLORIDE 0.9% FLUSH
3.0000 mL | Freq: Two times a day (BID) | INTRAVENOUS | Status: DC
  Administered 2019-03-09 – 2019-03-21 (×16): 3 mL via INTRAVENOUS

## 2019-03-08 MED ORDER — FUROSEMIDE 10 MG/ML IJ SOLN
160.0000 mg | Freq: Three times a day (TID) | INTRAVENOUS | Status: DC
  Filled 2019-03-08 (×3): qty 16

## 2019-03-08 MED ORDER — CALCITRIOL 0.25 MCG PO CAPS
0.5000 ug | ORAL_CAPSULE | Freq: Every day | ORAL | Status: DC
  Administered 2019-03-09 – 2019-03-16 (×8): 0.5 ug via ORAL
  Filled 2019-03-08 (×7): qty 2
  Filled 2019-03-08: qty 1
  Filled 2019-03-08: qty 2
  Filled 2019-03-08 (×2): qty 1

## 2019-03-08 MED ORDER — FUROSEMIDE 10 MG/ML IJ SOLN
160.0000 mg | Freq: Three times a day (TID) | INTRAVENOUS | Status: DC
  Administered 2019-03-08 – 2019-03-17 (×24): 160 mg via INTRAVENOUS
  Filled 2019-03-08 (×9): qty 16
  Filled 2019-03-08: qty 10
  Filled 2019-03-08 (×21): qty 16

## 2019-03-08 MED ORDER — ACETAMINOPHEN 325 MG PO TABS
650.0000 mg | ORAL_TABLET | Freq: Four times a day (QID) | ORAL | Status: DC | PRN
  Administered 2019-03-11 – 2019-03-28 (×16): 650 mg via ORAL
  Filled 2019-03-08 (×16): qty 2

## 2019-03-08 MED ORDER — DIGOXIN 125 MCG PO TABS: 0.1250 mg | ORAL_TABLET | Freq: Every day | ORAL | Status: DC

## 2019-03-08 MED ORDER — ASPIRIN 81 MG PO CHEW
81.0000 mg | CHEWABLE_TABLET | ORAL | Status: AC
  Administered 2019-03-09: 81 mg via ORAL
  Filled 2019-03-08: qty 1

## 2019-03-08 MED ORDER — ONDANSETRON HCL 4 MG PO TABS: 4.0000 mg | ORAL_TABLET | Freq: Four times a day (QID) | ORAL | Status: DC | PRN

## 2019-03-08 MED ORDER — SODIUM CHLORIDE 0.9 % IV SOLN: INTRAVENOUS | Status: DC

## 2019-03-08 NOTE — Progress Notes (Signed)
PROGRESS NOTE    [name]  RWE:[id number] DOB: 06-13-1960 DOA: 03/07/2019 PCP: [name], MD      Brief Narrative:  [name] is a 59 y.o. F with MGUS and thromboycytopenia followed by Dr. Delton Coombes, cAF on warfarin, CHB with pacer, non-ischemic CM and sCHF EF 15-20% with ICD, CKD IV baseline Cr 3.0, and cirrhosis who presented with several weeks progressive swelling, about 30 pound weight gain.  Patient spoke with her cardiologist office, because furosemide 80 twice daily and metolazone 2.5 daily since Thanksgiving were not helping. They recommended transition to torsemide 20 twice daily, which also did not help and the swelling was causing severe leg pain, so finally she came to the ER.  In the ER, patient found to have creatinine >5, anasarca, hemoglobin 11. Chest x-ray was clear, but she required 2 L of supplemental oxygen (nonrebreather was documented, this appears to be spurious). She was given 40 mg IV Lasix without urine output. Case was discussed with the cardiology fellow overnight, who recommended transfer to [facility] for inotrope.        Assessment & Plan:  Acute on chronic systolic CHF EF 61-95% in 2019.   -Furosemide 160 mg IV now -Strict I/Os, daily weights, telemetry   -Transfer to [facility] for Adv HF team evaluation and consideration of inotropes    Acute on chronic renal failure Baseline CKD stage IV  Baseline Cr 2.9-3.5 in last year.  Suspect this acute rise now is cardiorenal.  Failed outpatient Lasix 80 BID + metolazone and torsemide 20 BID with metolazone.  Empiric lasix 40 mg IV in ER was inadqeuate. -Furosemide 160 mg IV now -Consult Nephrology, appreciate expert attention -Close monitoring renal function    CHB with pacer  MGUS and thrombocytopenia Follows with Dr. Delton Coombes. Platelets stable at 139K.  Chronic atrial fibrillation -continue warfarin  Diabetes glucoses low, now in renal failure, poor p.o. intake. Recent  hemoglobin A1c 7.2%. -hold Victoza, Tresiba, and NPH -sliding scale corrections, low-dose for now  Cirrhosis this appears well compensated. Her GI note most recently assessed etiology is unclear, either congestive heart failure related, or burned-out NASH.  Normocytic anemia No clinical bleeding. CT imaging rules out hematoma  Hyponatremia this is hypervolemic.  -Diurese and trend sodium.  Abdominal firmness, hematoma ruled out patient seen by her oncologist several days ago, noted to have bruising and firmness in the lower anterior abdominal wall. CT abdomen and pelvis and RIGHT femur were obtained here to visualize hematoma, and these studies show severe anasarca, no hematoma in the anterior abdominal wall, or right thigh. -Resume warfarin        DVT prophylaxis: Warfarin Code Status: FULL Family Communication: Husband by phone MDM and disposition Plan: This is a no charge note.  For further details, please see H&P by my partner Dr. Darrick Meigs from earlier today.  The below labs and imaging reports were reviewed and summarized above.    The patient was admitted with CHF and 30 lb weight gain.    Objective: Vitals:   03/08/19 0045 03/08/19 0345 03/08/19 0500 03/08/19 0830  BP:   100/80 103/74  Pulse: 60 (!) 58 (!) 59 (!) 59  Resp: 17 14 15 18   Temp:      TempSrc:      SpO2: 100% 98% 98% 98%  Weight:      Height:       No intake or output data in the 24 hours ending 03/08/19 [place] Weights   03/07/19 2008  Weight: 90.7 kg    Examination: The patient was seen and examined.      Data Reviewed: I have personally reviewed following labs and imaging studies:  CBC: Recent Labs  Lab 03/07/19 2101  WBC 6.8  NEUTROABS 5.5  HGB 10.9*  HCT 33.3*  MCV 84.9  PLT 824*   Basic Metabolic Panel: Recent Labs  Lab 03/07/19 2101  NA 127*  K 5.3*  CL 94*  CO2 20*  GLUCOSE 77  BUN 118*  CREATININE 5.04*  CALCIUM 8.4*   GFR: Estimated Creatinine Clearance:  13.4 mL/min (A) (by C-G formula based on SCr of 5.04 mg/dL (H)). Liver Function Tests: Recent Labs  Lab 03/07/19 2101  AST 23  ALT 17  ALKPHOS 324*  BILITOT 3.0*  PROT 7.7  ALBUMIN 2.9*   No results for input(s): LIPASE, AMYLASE in the last 168 hours. No results for input(s): AMMONIA in the last 168 hours. Coagulation Profile: Recent Labs  Lab 03/01/19 1155 03/01/19 1308 03/04/19 1151 03/07/19 2101  INR 7.2* 6.6* 5.1* 3.0*   Cardiac Enzymes: No results for input(s): CKTOTAL, CKMB, CKMBINDEX, TROPONINI in the last 168 hours. BNP (last 3 results) No results for input(s): PROBNP in the last 8760 hours. HbA1C: No results for input(s): HGBA1C in the last 72 hours. CBG: No results for input(s): GLUCAP in the last 168 hours. Lipid Profile: No results for input(s): CHOL, HDL, LDLCALC, TRIG, CHOLHDL, LDLDIRECT in the last 72 hours. Thyroid Function Tests: No results for input(s): TSH, T4TOTAL, FREET4, T3FREE, THYROIDAB in the last 72 hours. Anemia Panel: No results for input(s): VITAMINB12, FOLATE, FERRITIN, TIBC, IRON, RETICCTPCT in the last 72 hours. Urine analysis:    Component Value Date/Time   COLORURINE YELLOW 03/03/2017 1229   APPEARANCEUR CLOUDY (A) 03/03/2017 1229   LABSPEC 1.009 03/03/2017 1229   PHURINE 7.0 03/03/2017 1229   GLUCOSEU NEGATIVE 03/03/2017 1229   GLUCOSEU NEGATIVE 10/14/2014 1102   HGBUR NEGATIVE 03/03/2017 1229   BILIRUBINUR NEGATIVE 03/03/2017 1229   KETONESUR NEGATIVE 03/03/2017 1229   PROTEINUR 30 (A) 03/03/2017 1229   UROBILINOGEN 0.2 10/14/2014 1102   NITRITE NEGATIVE 03/03/2017 1229   LEUKOCYTESUR TRACE (A) 03/03/2017 1229   Sepsis Labs: @LABRCNTIP (procalcitonin:4,lacticacidven:4)  ) Recent Results (from the past 240 hour(s))  Respiratory Panel by RT PCR (Flu A&B, Covid) - Nasopharyngeal Swab     Status: None   Collection Time: 03/07/19  9:21 PM   Specimen: Nasopharyngeal Swab  Result Value Ref Range Status   SARS Coronavirus 2 by  RT PCR NEGATIVE NEGATIVE Final    Comment: (NOTE) SARS-CoV-2 target nucleic acids are NOT DETECTED. The SARS-CoV-2 RNA is generally detectable in upper respiratoy specimens during the acute phase of infection. The lowest concentration of SARS-CoV-2 viral copies this assay can detect is 131 copies/mL. A negative result does not preclude SARS-Cov-2 infection and should not be used as the sole basis for treatment or other patient management decisions. A negative result may occur with  improper specimen collection/handling, submission of specimen other than nasopharyngeal swab, presence of viral mutation(s) within the areas targeted by this assay, and inadequate number of viral copies (<131 copies/mL). A negative result must be combined with clinical observations, patient history, and epidemiological information. The expected result is Negative. Fact Sheet for Patients:  PinkCheek.be Fact Sheet for Healthcare Providers:  GravelBags.it This test is not yet ap proved or cleared by the [place] FDA and  has been authorized for detection and/or diagnosis of SARS-CoV-2 by FDA under an Emergency  Use Authorization (EUA). This EUA will remain  in effect (meaning this test can be used) for the duration of the COVID-19 declaration under Section 564(b)(1) of the Act, 21 U.S.C. section 360bbb-3(b)(1), unless the authorization is terminated or revoked sooner.    Influenza A by PCR NEGATIVE NEGATIVE Final   Influenza B by PCR NEGATIVE NEGATIVE Final    Comment: (NOTE) The Xpert Xpress SARS-CoV-2/FLU/RSV assay is intended as an aid in  the diagnosis of influenza from Nasopharyngeal swab specimens and  should not be used as a sole basis for treatment. Nasal washings and  aspirates are unacceptable for Xpert Xpress SARS-CoV-2/FLU/RSV  testing. Fact Sheet for Patients: PinkCheek.be Fact Sheet for Healthcare  Providers: GravelBags.it This test is not yet approved or cleared by the [place] FDA and  has been authorized for detection and/or diagnosis of SARS-CoV-2 by  FDA under an Emergency Use Authorization (EUA). This EUA will remain  in effect (meaning this test can be used) for the duration of the  Covid-19 declaration under Section 564(b)(1) of the Act, 21  U.S.C. section 360bbb-3(b)(1), unless the authorization is  terminated or revoked. Performed at [facility], [address] [place], [place] [address]          Radiology Studies: CT ABDOMEN PELVIS WO CONTRAST  Result Date: 03/08/2019 CLINICAL DATA:  Abdominal distension EXAM: CT ABDOMEN AND PELVIS WITHOUT CONTRAST TECHNIQUE: Multidetector CT imaging of the abdomen and pelvis was performed following the standard protocol without IV contrast. COMPARISON:  None. FINDINGS: LOWER CHEST: Cardiomegaly bibasilar atelectasis. HEPATOBILIARY: Normal hepatic contours. No intra- or extrahepatic biliary dilatation. There is cholelithiasis without acute inflammation. PANCREAS: Normal pancreas. No ductal dilatation or peripancreatic fluid collection. SPLEEN: Normal. ADRENALS/URINARY TRACT: The adrenal glands are normal. No hydronephrosis, nephroureterolithiasis or solid renal mass. The urinary bladder is normal for degree of distention STOMACH/BOWEL: There is no hiatal hernia. Normal duodenal course and caliber. No small bowel dilatation or inflammation. No focal colonic abnormality. Not visualized. No right lower quadrant inflammation or free fluid. VASCULAR/LYMPHATIC: There is calcific atherosclerosis of the abdominal aorta. No abdominal or pelvic lymphadenopathy. REPRODUCTIVE: Normal uterus and ovaries. MUSCULOSKELETAL. No bony spinal canal stenosis or focal osseous abnormality. OTHER: Anasarca. IMPRESSION: 1. No acute abnormality of the abdomen or pelvis. 2. Cardiomegaly and calcific aortic atherosclerosis (ICD10-I70.0). 3.  Diffuse anasarca. Electronically Signed   By: Ulyses Jarred M.D.   On: 03/08/2019 02:02   CT FEMUR RIGHT WO CONTRAST  Result Date: 03/08/2019 CLINICAL DATA:  Upper leg trauma, right thigh swelling EXAM: CT OF THE LOWER RIGHT EXTREMITY WITHOUT CONTRAST TECHNIQUE: Multidetector CT imaging of the right lower extremity was performed according to the standard protocol. COMPARISON:  None. FINDINGS: Bones/Joint/Cartilage No fracture or dislocation. There is moderate right hip osteoarthritis with superior joint space loss and marginal osteophyte formation. No periosteal reaction or cortical destruction. No large knee or hip joint effusion is seen. Ligaments Suboptimally assessed by CT. Muscles and Tendons The enthesopathy seen at the hamstrings insertion site. The remainder of the visualized portion of the tendons appear to be intact. The muscles surrounding the thigh appear to be intact. Soft tissues There is diffuse skin thickening and subcutaneous edema seen surrounding the right lower extremity. Overlying the greater trochanter adjacent to the deep fascial layer there is thin linear area of fluid. The fluid does not appear to be encapsulated however. Dense vascular calcifications seen within the deep pelvis. IMPRESSION: 1. No acute osseous abnormality or soft tissue hematoma. 2. Thin non loculated fluid  seen adjacent to the deep fascial layers/iliotibial tibial tract overlying the greater trochanter which could represent a posttraumatic seroma/Mild Sherry Ruffing lesion. 3. Diffuse subcutaneous edema and skin thickening surrounding the right lower extremity. Electronically Signed   By: Prudencio Pair M.D.   On: 03/08/2019 02:09   DG Chest Portable 1 View  Result Date: 03/07/2019 CLINICAL DATA:  Shortness of breath, weakness EXAM: PORTABLE CHEST 1 VIEW COMPARISON:  06/25/2017 FINDINGS: Left AICD remains in place, unchanged. Cardiomegaly. No confluent opacities or effusions. No edema. No acute bony abnormality.  IMPRESSION: Cardiomegaly.  No acute cardiopulmonary disease. Electronically Signed   By: Rolm Baptise M.D.   On: 03/07/2019 21:04        Scheduled Meds: Continuous Infusions:   LOS: 0 days    Time spent: 20 minutes    Edwin Dada, MD [facility] 03/08/2019, 9:17 AM     Please page though [place] or Epic secure chat:  For password, contact charge nurse

## 2019-03-08 NOTE — ED Notes (Signed)
OJ and one pack of graham crackers given until lunch tray gets here. Nad.

## 2019-03-08 NOTE — ED Notes (Signed)
NT found pts O2 @ 88% on RA @ 2330, when this RN was made aware pt was @ 77% on RA, pt placed on 4L per [place], no change in O2 sats, pt then placed on NRB, pt now at 92%. MD made aware. Pt nad.

## 2019-03-08 NOTE — Consult Note (Signed)
Corneisha Alvi BVAPOLID Admit Date: 03/07/2019 03/08/2019 Rexene Agent Requesting Physician:  Loleta Books MD  Reason for Consult:  AoCKD, sCHF acute exacerbation HPI:  11F with history of chronic biventricular systolic heart failure from nonischemic cardiomyopathy (LVEF 15 to 20% from 04/129 TTE) complicated by atrial fibrillation and history of complete heart block requiring PPM/biventricular ICD; MGUS followed by hematology; DM2, history of kidney stones; GERD; chronic anticoagulation; and significant CKD with most recent serum creatinine between 3 and 3.6 followed by Dr. Lowanda Foster historically who presented with subacute progression of lower extremity edema, abdominal edema, exertional dyspnea, orthopnea.  Weight gain of at least 3 kg over the past week.  Upon presentation she was hypoxic requiring nasal cannula.  Chest x-ray with no obvious pulmonary edema; CT abdomen and pelvis with no ascites, no hydronephrosis or renal mass or stone, diffuse anasarca throughout the lower extremities and abdominal body wall.  This was without IV contrast.  Also concern for a seroma across the greater trochanter of the right femur.  Presenting creatinine of 5.0, K5.3, sodium 127, HCO3 20, BUN 118.  Upon presentation patient received 40 mg IV Lasix and no UOP.  Case has been discussed with cardiology and she is to be transferred to [facility] for advanced heart failure therapies and consideration of inotropes.  She takes benazepril.  She denies use of NSAIDs.  Creat (mg/dL)  Date Value  08/02/2016 2.47 (H)  05/31/2013 2.04 (H)  04/19/2013 2.34 (H)  10/14/2012 2.31 (H)  10/16/2011 2.17 (H)  07/04/2011 1.96 (H)  06/05/2011 2.06 (H)  05/23/2011 2.11 (H)  04/29/2011 2.28 (H)  11/07/2010 1.95 (H)   Creatinine, Ser (mg/dL)  Date Value  03/07/2019 5.04 (H)  01/18/2019 3.10 (H)  11/30/2018 3.62 (H)  09/23/2018 3.07 (H)  03/03/2017 2.60 (H)  09/22/2015 2.46 (H)  05/26/2015 2.24 (H)  01/23/2015 2.49 (H)  08/02/2013 2.3  (H)  01/27/2013 2.6 (H)  ] I/Os: No recorded UOP despite Lasix  ROS NSAIDS: Denies use IV Contrast no exposure TMP/SMX no exposure identified Hypotension blood pressure soft at presentation Balance of 12 systems is negative w/ exceptions as above  PMH  Past Medical History:  Diagnosis Date  . Automatic implantable cardioverter-defibrillator in situ    a. 03/2013: BSX Energen CRTD BiV ICC, ser# [id number]  . Chronic anticoagulation    a. coumadin.  . Chronic atrial fibrillation (HCC)    embolic rind  . Chronic kidney disease    creatinine- 1.8 in 09/2006 and 2.0 in 05/2007; 2.05 in 2011, 2.29 in 2012  . Chronic systolic CHF (congestive heart failure) ([place])    a. 03/2013 Echo: Sev LV dysfxn with sev diff HK, restrictive phys, diast dysfxn, mild MR, sev dil LA/RA, mod PR, PASP 19mmHg.  . Congenital third degree heart block    a. Guidant VVI pacemaker implanted in 09/1997; b. gen change in 01/2004;  c. 03/2013 upgrade to [place], ser# [id number].  . Dizziness and giddiness 05/19/2012  . Dysphagia 08/14/2011   FEB 2013 EGD/DIL 16 MM; GERD; h/o gastroesophageal reflux disease; + H. Pylori gastritis   . GERD (gastroesophageal reflux disease)   . Helicobacter pylori gastritis 08/14/2011  . IDDM (insulin dependent diabetes mellitus)   . Kidney stones 1990's  . Nonischemic cardiomyopathy ([place])    a. 03/2013 Echo: Sev LV dysfxn with sev diff HK, restrictive phys, diast dysfxn, mild MR, sev dil LA/RA, mod PR, PASP 32mmHg;  b. 03/2013: BSX Energen CRTD BiV ICC, ser# [id number].  Marland Kitchen Potassium (  K) excess   . Stroke [facility]) 1999   denies residual on 04/21/2013   [facility]  Past Surgical History:  Procedure Laterality Date  . BI-VENTRICULAR IMPLANTABLE CARDIOVERTER DEFIBRILLATOR  (CRT-D)  04/21/2013  . BI-VENTRICULAR IMPLANTABLE CARDIOVERTER DEFIBRILLATOR UPGRADE N/A 04/21/2013   Procedure: BI-VENTRICULAR IMPLANTABLE CARDIOVERTER DEFIBRILLATOR UPGRADE;  Surgeon: Evans Lance, MD;  Location: [facility] CATH LAB;   Service: Cardiovascular;  Laterality: N/A;  . COLONOSCOPY  04/23/2011   FYB:OFBPZWCHEN COLON POLYP/ INTERNAL HEMORRHOIDS/ TORTUOUS COLON  . INSERT / REPLACE / REMOVE PACEMAKER  09/1997   Guidant VVI [employer]  . PACEMAKER GENERATOR CHANGE  01/2004  . UPPER GASTROINTESTINAL ENDOSCOPY  FEB 2013   RING/DIL TO 16 MM, H PYLORI GASTRITIS   FH  Family History  Adopted: Yes  Problem Relation Age of Onset  . Autism Son   . Seizures Son   . Diabetes Daughter   . Colon cancer Neg Hx   . Colon polyps Neg Hx    SH  reports that she has never smoked. She has never used smokeless tobacco. She reports that she does not drink alcohol or use drugs. Allergies  Allergies  Allergen Reactions  . Wheat Swelling  . Latex Rash  . Penicillins Rash  . Sulfa Antibiotics Rash   Home medications Prior to Admission medications   Medication Sig Start Date End Date Taking? Authorizing Provider  ACCU-CHEK GUIDE test strip 1 each by Other route 4 (four) times daily.  02/25/19  Yes [provider]  acetaminophen (TYLENOL) 500 MG tablet Take 500 mg by mouth every 6 (six) hours as needed for moderate pain.   Yes [provider]  Alcohol Swabs (ALCOHOL PREP) PADS 1 each by Does not apply route daily. 02/11/18  Yes Elayne Snare, MD  allopurinol (ZYLOPRIM) 100 MG tablet Take 150 mg by mouth every morning. Reported on 06/15/2015   Yes [provider]  benazepril (LOTENSIN) 10 MG tablet Take 10 mg by mouth daily.   Yes [provider]  calcitRIOL (ROCALTROL) 0.25 MCG capsule Take 0.5 mcg by mouth daily. Take 2 tablets by mouth once daily.   Yes [provider]  cholecalciferol (VITAMIN D3) 25 MCG (1000 UT) tablet Take 1,000 Units by mouth daily.   Yes [provider]  COREG CR 40 MG 24 hr capsule TAKE ONE CAPSULE BY MOUTH DAILY. 11/24/17  Yes Evans Lance, MD  digoxin (LANOXIN) 0.125 MG tablet TAKE 1 TABLET BY MOUTH DAILY EXCEPT ON SATURDAY AND  SUNDAY. Patient taking differently: Take 0.125 mg by mouth daily. Except on Saturday and Sunday 11/27/18  Yes Herminio Commons, MD  insulin NPH Human (HUMULIN N) 100 UNIT/ML injection Inject 0.34 mLs (34 Units total) into the skin daily. Patient taking differently: Inject 22 Units into the skin daily.  07/22/18  Yes Elayne Snare, MD  Insulin Pen Needle 31G X 5 MM MISC 1 each by Does not apply route daily. Use as instructed to check blood sugar 4 times daily. 02/11/18  Yes Elayne Snare, MD  Insulin Syringe-Needle U-100 30G 1 ML MISC 1 each by Does not apply route daily. Use needle to inject insulin 3 times daily. 02/13/18  Yes Elayne Snare, MD  LITETOUCH PEN NEEDLES 31G X 8 MM MISC USE AS DIRECTED UP TO 4 TIMES DAILY. Patient taking differently: See admin instructions.  01/27/19  Yes Elayne Snare, MD  meclizine (ANTIVERT) 12.5 MG tablet Take 12.5 mg by mouth 3 (three) times daily as needed. 02/11/19  Yes [provider]  metolazone (ZAROXOLYN) 2.5 MG tablet Take 2.5 mg by mouth daily as needed. 02/01/19  Yes [provider]  NOVOLOG FLEXPEN 100 UNIT/ML FlexPen INJECT SUBCUTANEOUSLY 12 UNITS BEFORE BREAKFAST; 14 UNITS AT LUNCH; AND 16 UNITS AT DINNER. Patient taking differently: See admin instructions. INJECT SUBCUTANEOUSLY 12 UNITS BEFORE BREAKFAST; 14 UNITS AT LUNCH; AND 16 UNITS AT [facility] 01/27/19  Yes Elayne Snare, MD  SURE COMFORT INSULIN SYRINGE 31G X 5/16" 0.5 ML MISC INJECT ONCE DAILY AS DIRECTED. Patient taking differently: See admin instructions. INJECT ONCE DAILY AS DIRECTED. 01/27/19  Yes Elayne Snare, MD  torsemide (DEMADEX) 20 MG tablet Take 1 tablet (20 mg total) by mouth 2 (two) times daily. 03/05/19 06/03/19 Yes Herminio Commons, MD  traMADol (ULTRAM) 50 MG tablet Take 1 tablet (50 mg total) by mouth 2 (two) times daily as needed. 03/03/19  Yes Derek Jack, MD  TRESIBA FLEXTOUCH 200 UNIT/ML SOPN INJECT 64 UNITS INTO THE SKIN DAILY BEFORE BREAKFAST. Patient taking  differently: Inject 56 Units into the skin daily before breakfast.  10/25/18  Yes Elayne Snare, MD  VICTOZA 18 MG/3ML SOPN INJECT 1.2MG  SUBCUTANEOUSLY DAILY AS DIRECTED. Patient taking differently: Inject 1.2 mg into the skin daily. INJECT 1.2MG  SUBCUTANEOUSLY DAILY AS DIRECTED. 01/27/19  Yes Elayne Snare, MD  warfarin (COUMADIN) 5 MG tablet TAKE 1/2 DAILY EXCEPT ON WEDNESDAY TAKE 1 TABLET. Patient taking differently: Take 2.5 mg by mouth daily. TAKE 1/2 DAILY. 06/30/18  Yes Herminio Commons, MD  diclofenac Sodium (VOLTAREN) 1 % GEL APPLY TO AFFECTED AREASETWICE DAILY. 02/23/19   [provider]    Current Medications Scheduled Meds: . calcitRIOL  0.5 mcg Oral Daily  . digoxin  0.125 mg Oral Daily  . insulin aspart  0-5 Units Subcutaneous QHS  . insulin aspart  0-9 Units Subcutaneous TID WC  . sodium chloride flush  3 mL Intravenous Q12H   Continuous Infusions: . sodium chloride    . furosemide     PRN Meds:.sodium chloride, acetaminophen **OR** acetaminophen, ondansetron **OR** ondansetron (ZOFRAN) IV, sodium chloride flush  CBC Recent Labs  Lab 03/07/19 2101  WBC 6.8  NEUTROABS 5.5  HGB 10.9*  HCT 33.3*  MCV 84.9  PLT 956*   Basic Metabolic Panel Recent Labs  Lab 03/07/19 2101  NA 127*  K 5.3*  CL 94*  CO2 20*  GLUCOSE 77  BUN 118*  CREATININE 5.04*  CALCIUM 8.4*    Physical Exam  Blood pressure 103/74, pulse (!) 59, temperature 97.7 F (36.5 C), temperature source Oral, resp. rate 18, height 5\' 5"  (1.651 m), weight 90.7 kg, SpO2 98 %. GEN: Chronically ill-appearing, conversant ENT: NCAT EYES: EOMI CV: Regular, normal S1-S2, no murmur gallop or rub PULM: Diminished in the bases bilaterally, otherwise clear, normal work of breathing at 45 degrees elevation ABD: Obese, soft, nontender; significant anasarca throughout the abdominal wall both anterior and posterior SKIN: No rashes or lesions EXT: 3-4+ lower extremity edema   Assessment 59F advanced CKD  BL 3-3.5 and longstanding chronic systolic heart failure presenting with decompensated acute systolic heart failure, massive volume overload, acute on chronic renal insufficiency.  1. AoCKD4: Most likely due to cardiorenal syndrome but the use of ACE inhibitor also contributing.  No NSAID exposure.  No IV contrast exposure.  CT abdomen without acute structural issue. 2. Decompensated acute on chronic systolic heart failure 3. Chronic atrial fibrillation with ICD/PPM, warfarin, digoxin 4. DM2 5. MGUS followed by hematology 6. Chronic mild thrombocytopenia followed by hematology  Plan 1. Lasix 160 mg IV now 2. If responds schedule at least 3 times daily dosing 3. Agree with transfer to [facility] for further advanced therapies, eventually will progress to require CRRT in order to offload her volume 4. Check UA, UPC, urine sodium 5. Daily weights, Daily Renal Panel, Strict I/Os, Avoid nephrotoxins (NSAIDs, judicious IV Contrast) 6. Nephrology will follow along here and at [facility].   Rexene Agent  [phone] 03/08/2019, 9:29 AM

## 2019-03-08 NOTE — ED Notes (Signed)
Generalize edema noted from chest down.  Lower abdomen hard.  Hospitalist at bedside.  Denies any pain.

## 2019-03-08 NOTE — ED Notes (Signed)
Awaiting transport "a little after three"

## 2019-03-08 NOTE — ED Notes (Signed)
Report given to [facility] with [place]. ETA 25 min.

## 2019-03-08 NOTE — ED Notes (Signed)
Pt given lunch tray.

## 2019-03-08 NOTE — H&P (Addendum)
TRH H&P    Patient Demographics:    [name], is a 59 y.o. female  MRN: [id number]  DOB - October 20, 1960  Admit Date - 03/07/2019  Referring MD/NP/PA: Dr. Roderic Palau  Outpatient Primary MD for the patient is [name], MD  Patient coming from: Home  Chief complaint-increased swelling of the legs and abdomen.   HPI:    [name]  is a 59 y.o. female, with history of chronic atrial fibrillation on anticoagulation with Coumadin and complete heart block s/p pacemaker placement, upgrade to BiV ICD 5 years ago, chronic systolic CHF, with EF 15 to 20%, thrombocytopenia, liver cirrhosis, who came to hospital with complaints of increased swelling in her legs and abdomen.  She has gained 3 kg since 03/03/19.  She denies nausea vomiting or diarrhea.  Denies fever or chills.  Denies dysuria. She does complain of orthopnea and PND Admits of coughing up clear phlegm. In the ED, lab work showed creatinine 5.04, sodium 127.  BNP 1072.0. She did receive 1 dose of Lasix 40 mg IV in the ED.  Cardiology was consulted by ED physician, and recommended transfer to [facility].  No beds available at this time.    Review of systems:    In addition to the HPI above,  .  All other systems reviewed and are negative.    Past History of the following :    Past Medical History:  Diagnosis Date  . Automatic implantable cardioverter-defibrillator in situ    a. 03/2013: BSX Energen CRTD BiV ICC, ser# [id number]  . Chronic anticoagulation    a. coumadin.  . Chronic atrial fibrillation (HCC)    embolic rind  . Chronic kidney disease    creatinine- 1.8 in 09/2006 and 2.0 in 05/2007; 2.05 in 2011, 2.29 in 2012  . Chronic systolic CHF (congestive heart failure) ([place])    a. 03/2013 Echo: Sev LV dysfxn with sev diff HK, restrictive phys, diast dysfxn, mild MR, sev dil LA/RA, mod PR, PASP 33mmHg.  . Congenital third degree heart block    a. Guidant VVI pacemaker implanted in 09/1997; b. gen change in 01/2004;  c. 03/2013 upgrade to [place], ser# [id number].  . Dizziness and giddiness 05/19/2012  . Dysphagia 08/14/2011   FEB 2013 EGD/DIL 16 MM; GERD; h/o gastroesophageal reflux disease; + H. Pylori gastritis   . GERD (gastroesophageal reflux disease)   . Helicobacter pylori gastritis 08/14/2011  . IDDM (insulin dependent diabetes mellitus)   . Kidney stones 1990's  . Nonischemic cardiomyopathy ([place])    a. 03/2013 Echo: Sev LV dysfxn with sev diff HK, restrictive phys, diast dysfxn, mild MR, sev dil LA/RA, mod PR, PASP 18mmHg;  b. 03/2013: BSX Energen CRTD BiV ICC, ser# [id number].  Marland Kitchen Potassium (K) excess   . Stroke [facility]) 1999   denies residual on 04/21/2013      Past Surgical History:  Procedure Laterality Date  . BI-VENTRICULAR IMPLANTABLE CARDIOVERTER DEFIBRILLATOR  (CRT-D)  04/21/2013  . BI-VENTRICULAR IMPLANTABLE CARDIOVERTER DEFIBRILLATOR UPGRADE N/A 04/21/2013   Procedure: BI-VENTRICULAR IMPLANTABLE  CARDIOVERTER DEFIBRILLATOR UPGRADE;  Surgeon: Evans Lance, MD;  Location: [facility] C[facility] LAB;  Service: Cardiovascular;  Laterality: N/A;  . COLONOSCOPY  04/23/2011   [id number] COLON POLYP/ INTERNAL HEMORRHOIDS/ TORTUOUS COLON  . INSERT / REPLACE / REMOVE PACEMAKER  09/1997   Guidant VVI [employer]  . PACEMAKER GENERATOR CHANGE  01/2004  . UPPER GASTROINTESTINAL ENDOSCOPY  FEB 2013   RING/DIL TO 16 MM, H PYLORI GASTRITIS      Social History:      Social History   Tobacco Use  . Smoking status: Never Smoker  . Smokeless tobacco: Never Used  . Tobacco comment: Never smoked  Substance Use Topics  . Alcohol use: No    Alcohol/week: 0.0 standard drinks       Family History :     Family History  Adopted: Yes  Problem Relation Age of Onset  . Autism Son   . Seizures Son   . Diabetes Daughter   . Colon cancer Neg Hx   . Colon polyps Neg Hx       Home Medications:   Prior to Admission  medications   Medication Sig Start Date End Date Taking? Authorizing Provider  ACCU-CHEK GUIDE test strip 1 each by Other route 4 (four) times daily.  02/25/19  Yes [provider]  acetaminophen (TYLENOL) 500 MG tablet Take 500 mg by mouth every 6 (six) hours as needed for moderate pain.   Yes [provider]  Alcohol Swabs (ALCOHOL PREP) PADS 1 each by Does not apply route daily. 02/11/18  Yes Elayne Snare, MD  allopurinol (ZYLOPRIM) 100 MG tablet Take 150 mg by mouth every morning. Reported on 06/15/2015   Yes [provider]  benazepril (LOTENSIN) 10 MG tablet Take 10 mg by mouth daily.   Yes [provider]  calcitRIOL (ROCALTROL) 0.25 MCG capsule Take 0.5 mcg by mouth daily. Take 2 tablets by mouth once daily.   Yes [provider]  cholecalciferol (VITAMIN D3) 25 MCG (1000 UT) tablet Take 1,000 Units by mouth daily.   Yes [provider]  COREG CR 40 MG 24 hr capsule TAKE ONE CAPSULE BY MOUTH DAILY. 11/24/17  Yes Evans Lance, MD  digoxin (LANOXIN) 0.125 MG tablet TAKE 1 TABLET BY MOUTH DAILY EXCEPT ON SATURDAY AND SUNDAY. Patient taking differently: Take 0.125 mg by mouth daily. Except on Saturday and Sunday 11/27/18  Yes Herminio Commons, MD  insulin NPH Human (HUMULIN N) 100 UNIT/ML injection Inject 0.34 mLs (34 Units total) into the skin daily. Patient taking differently: Inject 22 Units into the skin daily.  07/22/18  Yes Elayne Snare, MD  Insulin Pen Needle 31G X 5 MM MISC 1 each by Does not apply route daily. Use as instructed to check blood sugar 4 times daily. 02/11/18  Yes Elayne Snare, MD  Insulin Syringe-Needle U-100 30G 1 ML MISC 1 each by Does not apply route daily. Use needle to inject insulin 3 times daily. 02/13/18  Yes Elayne Snare, MD  LITETOUCH PEN NEEDLES 31G X 8 MM MISC USE AS DIRECTED UP TO 4 TIMES DAILY. Patient taking differently: See admin instructions.  01/27/19  Yes Elayne Snare, MD  meclizine (ANTIVERT) 12.5 MG  tablet Take 12.5 mg by mouth 3 (three) times daily as needed. 02/11/19  Yes [provider]  metolazone (ZAROXOLYN) 2.5 MG tablet Take 2.5 mg by mouth daily as needed. 02/01/19  Yes [provider]  NOVOLOG FLEXPEN 100 UNIT/ML FlexPen INJECT SUBCUTANEOUSLY 12 UNITS BEFORE  BREAKFAST; 14 UNITS AT LUNCH; AND 16 UNITS AT DINNER. Patient taking differently: See admin instructions. INJECT SUBCUTANEOUSLY 12 UNITS BEFORE BREAKFAST; 14 UNITS AT LUNCH; AND 16 UNITS AT [facility] 01/27/19  Yes Elayne Snare, MD  SURE COMFORT INSULIN SYRINGE 31G X 5/16" 0.5 ML MISC INJECT ONCE DAILY AS DIRECTED. Patient taking differently: See admin instructions. INJECT ONCE DAILY AS DIRECTED. 01/27/19  Yes Elayne Snare, MD  torsemide (DEMADEX) 20 MG tablet Take 1 tablet (20 mg total) by mouth 2 (two) times daily. 03/05/19 06/03/19 Yes Herminio Commons, MD  traMADol (ULTRAM) 50 MG tablet Take 1 tablet (50 mg total) by mouth 2 (two) times daily as needed. 03/03/19  Yes Derek Jack, MD  TRESIBA FLEXTOUCH 200 UNIT/ML SOPN INJECT 45 UNITS INTO THE SKIN DAILY BEFORE BREAKFAST. Patient taking differently: Inject 56 Units into the skin daily before breakfast.  10/25/18  Yes Elayne Snare, MD  VICTOZA 18 MG/3ML SOPN INJECT 1.2MG  SUBCUTANEOUSLY DAILY AS DIRECTED. Patient taking differently: Inject 1.2 mg into the skin daily. INJECT 1.2MG  SUBCUTANEOUSLY DAILY AS DIRECTED. 01/27/19  Yes Elayne Snare, MD  warfarin (COUMADIN) 5 MG tablet TAKE 1/2 DAILY EXCEPT ON WEDNESDAY TAKE 1 TABLET. Patient taking differently: Take 2.5 mg by mouth daily. TAKE 1/2 DAILY. 06/30/18  Yes Herminio Commons, MD  diclofenac Sodium (VOLTAREN) 1 % GEL APPLY TO AFFECTED AREASETWICE DAILY. 02/23/19   [provider]     Allergies:     Allergies  Allergen Reactions  . Wheat Swelling  . Latex Rash  . Penicillins Rash  . Sulfa Antibiotics Rash     Physical Exam:   Vitals  Blood pressure 102/75, pulse (!) 59, temperature 97.7 F  (36.5 C), temperature source Oral, resp. rate (!) 25, height 5\' 5"  (1.651 m), weight 90.7 kg, SpO2 (!) 84 %.  1.  General: Appears in no acute distress  2. Psychiatric: Alert, oriented x3, intact insight and judgment  3. Neurologic: Cranial nerves II through XII grossly intact, no focal deficit noted  4. HEENMT:  Atraumatic normocephalic, extraocular muscles are intact  5. Respiratory : Clear to auscultation bilaterally, no wheezing or crackles  6. Cardiovascular : S1-S2, regular, no murmur auscultated, bilateral 2+ pitting edema of the lower extremities  7. Gastrointestinal:  Abdominal soft, palpable mass noted at the lower abdominal wall, mildly tender to palpation  8. Skin:  Ecchymosis noted at the lower abdominal wall, right thigh  9.Musculoskeletal:  Right thigh is edematous, tenderness to palpation noted in right thigh and hip joint.  No limitation of range of motion.   Data Review:    CBC Recent Labs  Lab 03/07/19 2101  WBC 6.8  HGB 10.9*  HCT 33.3*  PLT 139*  MCV 84.9  MCH 27.8  MCHC 32.7  RDW 16.9*  LYMPHSABS 0.6*  MONOABS 0.5  EOSABS 0.2  BASOSABS 0.0   ------------------------------------------------------------------------------------------------------------------  Results for orders placed or performed during the hospital encounter of 03/07/19 (from the past 48 hour(s))  CBC with Differential     Status: Abnormal   Collection Time: 03/07/19  9:01 PM  Result Value Ref Range   WBC 6.8 4.0 - 10.5 K/uL   RBC 3.92 3.87 - 5.11 MIL/uL   Hemoglobin 10.9 (L) 12.0 - 15.0 g/dL   HCT 33.3 (L) 36.0 - 46.0 %   MCV 84.9 80.0 - 100.0 fL   MCH 27.8 26.0 - 34.0 pg   MCHC 32.7 30.0 - 36.0 g/dL   RDW 16.9 (H) 11.5 - 15.5 %  Platelets 139 (L) 150 - 400 K/uL   nRBC 0.7 (H) 0.0 - 0.2 %   Neutrophils Relative % 82 %   Neutro Abs 5.5 1.7 - 7.7 K/uL   Lymphocytes Relative 8 %   Lymphs Abs 0.6 (L) 0.7 - 4.0 K/uL   Monocytes Relative 8 %   Monocytes Absolute  0.5 0.1 - 1.0 K/uL   Eosinophils Relative 2 %   Eosinophils Absolute 0.2 0.0 - 0.5 K/uL   Basophils Relative 0 %   Basophils Absolute 0.0 0.0 - 0.1 K/uL   Immature Granulocytes 0 %   Abs Immature Granulocytes 0.03 0.00 - 0.07 K/uL    Comment: Performed at [facility], [address] [place], [place] [address]  Basic metabolic panel     Status: Abnormal   Collection Time: 03/07/19  9:01 PM  Result Value Ref Range   Sodium 127 (L) 135 - 145 mmol/L   Potassium 5.3 (H) 3.5 - 5.1 mmol/L   Chloride 94 (L) 98 - 111 mmol/L   CO2 20 (L) 22 - 32 mmol/L   Glucose, Bld 77 70 - 99 mg/dL   BUN 118 (H) 6 - 20 mg/dL    Comment: RESULTS CONFIRMED BY MANUAL DILUTION   Creatinine, Ser 5.04 (H) 0.44 - 1.00 mg/dL   Calcium 8.4 (L) 8.9 - 10.3 mg/dL   GFR calc non Af Amer 9 (L) >60 mL/min   GFR calc Af Amer 10 (L) >60 mL/min   Anion gap 13 5 - 15    Comment: Performed at [facility], [address], [place], [place] [address]  Hepatic function panel     Status: Abnormal   Collection Time: 03/07/19  9:01 PM  Result Value Ref Range   Total Protein 7.7 6.5 - 8.1 g/dL   Albumin 2.9 (L) 3.5 - 5.0 g/dL   AST 23 15 - 41 U/L   ALT 17 0 - 44 U/L   Alkaline Phosphatase 324 (H) 38 - 126 U/L   Total Bilirubin 3.0 (H) 0.3 - 1.2 mg/dL   Bilirubin, Direct 1.7 (H) 0.0 - 0.2 mg/dL   Indirect Bilirubin 1.3 (H) 0.3 - 0.9 mg/dL    Comment: Performed at [facility], [address], [place], [place] [address]  Brain natriuretic peptide     Status: Abnormal   Collection Time: 03/07/19  9:01 PM  Result Value Ref Range   B Natriuretic Peptide 1,072.0 (H) 0.0 - 100.0 pg/mL    Comment: Performed at [facility], [address] [place], [place] [address]  Troponin I (High Sensitivity)     Status: Abnormal   Collection Time: 03/07/19  9:01 PM  Result Value Ref Range   Troponin I (High Sensitivity) 28 (H) <18 ng/L    Comment: (NOTE) Elevated high sensitivity troponin I (hsTnI) values and significant  changes across  serial measurements may suggest ACS but many other  chronic and acute conditions are known to elevate hsTnI results.  Refer to the "Links" section for chest pain algorithms and additional  guidance. Performed at [facility], [address] [place], [place] [address]   PT/INR     Status: Abnormal   Collection Time: 03/07/19  9:01 PM  Result Value Ref Range   Prothrombin Time 31.3 (H) 11.4 - 15.2 seconds   INR 3.0 (H) 0.8 - 1.2    Comment: (NOTE) INR goal varies based on device and disease states. Performed at [facility], [address] [place],  [address]   Digoxin level  Status: None   Collection Time: 03/07/19  9:01 PM  Result Value Ref Range   Digoxin Level 1.5 0.8 - 2.0 ng/mL    Comment: Performed at [facility], [address], [place], [place] [address]  Respiratory Panel by RT PCR (Flu A&B, Covid) - Nasopharyngeal Swab     Status: None   Collection Time: 03/07/19  9:21 PM   Specimen: Nasopharyngeal Swab  Result Value Ref Range   SARS Coronavirus 2 by RT PCR NEGATIVE NEGATIVE    Comment: (NOTE) SARS-CoV-2 target nucleic acids are NOT DETECTED. The SARS-CoV-2 RNA is generally detectable in upper respiratoy specimens during the acute phase of infection. The lowest concentration of SARS-CoV-2 viral copies this assay can detect is 131 copies/mL. A negative result does not preclude SARS-Cov-2 infection and should not be used as the sole basis for treatment or other patient management decisions. A negative result may occur with  improper specimen collection/handling, submission of specimen other than nasopharyngeal swab, presence of viral mutation(s) within the areas targeted by this assay, and inadequate number of viral copies (<131 copies/mL). A negative result must be combined with clinical observations, patient history, and epidemiological information. The expected result is Negative. Fact Sheet for Patients:  PinkCheek.be Fact  Sheet for Healthcare Providers:  GravelBags.it This test is not yet ap proved or cleared by the [place] FDA and  has been authorized for detection and/or diagnosis of SARS-CoV-2 by FDA under an Emergency Use Authorization (EUA). This EUA will remain  in effect (meaning this test can be used) for the duration of the COVID-19 declaration under Section 564(b)(1) of the Act, 21 U.S.C. section 360bbb-3(b)(1), unless the authorization is terminated or revoked sooner.    Influenza A by PCR NEGATIVE NEGATIVE   Influenza B by PCR NEGATIVE NEGATIVE    Comment: (NOTE) The Xpert Xpress SARS-CoV-2/FLU/RSV assay is intended as an aid in  the diagnosis of influenza from Nasopharyngeal swab specimens and  should not be used as a sole basis for treatment. Nasal washings and  aspirates are unacceptable for Xpert Xpress SARS-CoV-2/FLU/RSV  testing. Fact Sheet for Patients: PinkCheek.be Fact Sheet for Healthcare Providers: GravelBags.it This test is not yet approved or cleared by the [place] FDA and  has been authorized for detection and/or diagnosis of SARS-CoV-2 by  FDA under an Emergency Use Authorization (EUA). This EUA will remain  in effect (meaning this test can be used) for the duration of the  Covid-19 declaration under Section 564(b)(1) of the Act, 21  U.S.C. section 360bbb-3(b)(1), unless the authorization is  terminated or revoked. Performed at [facility], [address] [place], [place] [address]   Troponin I (High Sensitivity)     Status: Abnormal   Collection Time: 03/07/19 11:06 PM  Result Value Ref Range   Troponin I (High Sensitivity) 27 (H) <18 ng/L    Comment: (NOTE) Elevated high sensitivity troponin I (hsTnI) values and significant  changes across serial measurements may suggest ACS but many other  chronic and acute conditions are known to elevate hsTnI results.  Refer to the  "Links" section for chest pain algorithms and additional  guidance. Performed at [facility], [address] [place], [place] [address]    *Note: Due to a large number of results and/or encounters for the requested time period, some results have not been displayed. A complete set of results can be found in Results Review.    Chemistries  Recent Labs  Lab 03/07/19 2101  NA 127*  K  5.3*  CL 94*  CO2 20*  GLUCOSE 77  BUN 118*  CREATININE 5.04*  CALCIUM 8.4*  AST 23  ALT 17  ALKPHOS 324*  BILITOT 3.0*   ------------------------------------------------------------------------------------------------------------------  ------------------------------------------------------------------------------------------------------------------ GFR: Estimated Creatinine Clearance: 13.4 mL/min (A) (by C-G formula based on SCr of 5.04 mg/dL (H)). Liver Function Tests: Recent Labs  Lab 03/07/19 2101  AST 23  ALT 17  ALKPHOS 324*  BILITOT 3.0*  PROT 7.7  ALBUMIN 2.9*   No results for input(s): LIPASE, AMYLASE in the last 168 hours. No results for input(s): AMMONIA in the last 168 hours. Coagulation Profile: Recent Labs  Lab 03/01/19 1155 03/01/19 1308 03/04/19 1151 03/07/19 2101  INR 7.2* 6.6* 5.1* 3.0*   Cardiac Enzymes: No results for input(s): CKTOTAL, CKMB, CKMBINDEX, TROPONINI in the last 168 hours. BNP (last 3 results) No results for input(s): PROBNP in the last 8760 hours. HbA1C: No results for input(s): HGBA1C in the last 72 hours. CBG: No results for input(s): GLUCAP in the last 168 hours. Lipid Profile: No results for input(s): CHOL, HDL, LDLCALC, TRIG, CHOLHDL, LDLDIRECT in the last 72 hours. Thyroid Function Tests: No results for input(s): TSH, T4TOTAL, FREET4, T3FREE, THYROIDAB in the last 72 hours. Anemia Panel: No results for input(s): VITAMINB12, FOLATE, FERRITIN, TIBC, IRON, RETICCTPCT in the last 72  hours.  --------------------------------------------------------------------------------------------------------------- Urine analysis:    Component Value Date/Time   COLORURINE YELLOW 03/03/2017 1229   APPEARANCEUR CLOUDY (A) 03/03/2017 1229   LABSPEC 1.009 03/03/2017 1229   PHURINE 7.0 03/03/2017 1229   GLUCOSEU NEGATIVE 03/03/2017 1229   GLUCOSEU NEGATIVE 10/14/2014 1102   HGBUR NEGATIVE 03/03/2017 1229   BILIRUBINUR NEGATIVE 03/03/2017 1229   KETONESUR NEGATIVE 03/03/2017 1229   PROTEINUR 30 (A) 03/03/2017 1229   UROBILINOGEN 0.2 10/14/2014 1102   NITRITE NEGATIVE 03/03/2017 1229   LEUKOCYTESUR TRACE (A) 03/03/2017 1229      Imaging Results:    DG Chest Portable 1 View  Result Date: 03/07/2019 CLINICAL DATA:  Shortness of breath, weakness EXAM: PORTABLE CHEST 1 VIEW COMPARISON:  06/25/2017 FINDINGS: Left AICD remains in place, unchanged. Cardiomegaly. No confluent opacities or effusions. No edema. No acute bony abnormality. IMPRESSION: Cardiomegaly.  No acute cardiopulmonary disease. Electronically Signed   By: Rolm Baptise M.D.   On: 03/07/2019 21:04    My personal review of EKG: Rhythm NSR, paced rhythm   Assessment & Plan:    Active Problems:   Acute on chronic systolic CHF (congestive heart failure), NYHA class 4 (HCC)   1. Acute on chronic systolic CHF-patient's EF is 15 to 20%, today presenting with worsening swelling of lower extremities, dyspnea.  Patient oxygen level dropped in the ED and is currently on 15 L/min nonrebreather mask.  Chest x-ray shows no pulmonary edema.  She received 40 mg Lasix in the ED with no urine output.  She will need to be transferred to [facility] to be seen by heart failure team and will likely require inotropic support with milrinone as she has significant AKI.  Would not give further Lasix at this time, patient blood pressure is soft.  2. Abdominal wall hematoma/right thigh swelling and pain-patient developed coagulopathy few days ago  at that time her INR was supratherapeutic.  She has developed hematoma in the lower abdominal wall at the site of insulin injections.  She also complains of swelling in the right thigh with tenderness elicited at right hip joint and lateral aspect of right thigh.  Will obtain CT abdomen pelvis and CT  thigh without contrast to rule out hematoma/retroperitoneal bleed.  Patient's hemoglobin dropped from 12.1 on 02/03/2019  to 10.9.  3. Acute kidney injury on CKD stage IV-patient baseline creatinine is around 3.5, today presented with creatinine of 5.04, BUN 118.  Likely from worsening CHF.  Her EF is only 15 to 20%,.  Patient will need inotropic support for acute systolic CHF.  She received Lasix 40 mg IV in the ED with no diuretic response.  Will hold further Lasix at this time and transfer to [facility] for evaluation by heart failure team.  Follow BMP in a.m. if creatinine does not improve with milrinone drip she will need nephrology evaluation.  She was also prescribed metolazone recently we will hold metolazone at this time.  4. Diabetes mellitus type 2-patient takes NPH 34 units subcu daily.  We will continue with NPH , start sliding scale insulin with NovoLog.  Hold Victoza.  5. Chronic atrial fibrillation-patient is on anticoagulation with Coumadin, heart rate is controlled.  INR is 3.0, concern for hematoma, will hold Coumadin at this time.  6. History of thrombocytopenia-unclear etiology, patient is being followed by hematology as outpatient.   DVT Prophylaxis-   SCDs , patient on Coumadin, INR is 3.0.  AM Labs Ordered, also please review Full Orders  Family Communication: Admission, patients condition and plan of care including tests being ordered have been discussed with the patient  who indicate understanding and agree with the plan and Code Status.  Code Status: Full code  Admission status: Inpatient: Based on patients clinical presentation and evaluation of above clinical data, I have  made determination that patient meets Inpatient criteria at this time.  Time spent in minutes : 60 minutes   Kaiea Esselman S Jenene Kauffmann M.D

## 2019-03-08 NOTE — Progress Notes (Signed)
ANTICOAGULATION CONSULT NOTE - Initial Consult  Pharmacy Consult for Coumadin Indication: atrial fibrillation  Allergies  Allergen Reactions  . Wheat Swelling  . Latex Rash  . Penicillins Rash  . Sulfa Antibiotics Rash    Patient Measurements: Height: 5\' 5"  (165.1 cm) Weight: 200 lb (90.7 kg) IBW/kg (Calculated) : 57  Vital Signs: BP: 95/70 (01/11 1030) Pulse Rate: 59 (01/11 1030)  Labs: Recent Labs    03/07/19 2101 03/07/19 2306 03/08/19 0927  HGB 10.9*  --  10.8*  HCT 33.3*  --  32.8*  PLT 139*  --  123*  LABPROT 31.3*  --  31.7*  INR 3.0*  --  3.1*  CREATININE 5.04*  --  4.97*  TROPONINIHS 28* 27*  --     Estimated Creatinine Clearance: 13.6 mL/min (A) (by C-G formula based on SCr of 4.97 mg/dL (H)).   Medical History: Past Medical History:  Diagnosis Date  . Automatic implantable cardioverter-defibrillator in situ    a. 03/2013: BSX Energen CRTD BiV ICC, ser# [id number]  . Chronic anticoagulation    a. coumadin.  . Chronic atrial fibrillation (HCC)    embolic rind  . Chronic kidney disease    creatinine- 1.8 in 09/2006 and 2.0 in 05/2007; 2.05 in 2011, 2.29 in 2012  . Chronic systolic CHF (congestive heart failure) ([place])    a. 03/2013 Echo: Sev LV dysfxn with sev diff HK, restrictive phys, diast dysfxn, mild MR, sev dil LA/RA, mod PR, PASP 16mmHg.  . Congenital third degree heart block    a. Guidant VVI pacemaker implanted in 09/1997; b. gen change in 01/2004;  c. 03/2013 upgrade to [place], ser# [id number].  . Dizziness and giddiness 05/19/2012  . Dysphagia 08/14/2011   FEB 2013 EGD/DIL 16 MM; GERD; h/o gastroesophageal reflux disease; + H. Pylori gastritis   . GERD (gastroesophageal reflux disease)   . Helicobacter pylori gastritis 08/14/2011  . IDDM (insulin dependent diabetes mellitus)   . Kidney stones 1990's  . Nonischemic cardiomyopathy ([place])    a. 03/2013 Echo: Sev LV dysfxn with sev diff HK, restrictive phys, diast dysfxn, mild MR, sev dil  LA/RA, mod PR, PASP 9mmHg;  b. 03/2013: BSX Energen CRTD BiV ICC, ser# [id number].  Marland Kitchen Potassium (K) excess   . Stroke [facility]) 1999   denies residual on 04/21/2013    Medications:  See med rec  Assessment: 59 y.o. F with MGUS and thromboycytopenia followed by Dr. Delton Coombes, cAF on warfarin, CHB with pacer, non-ischemic CM and sCHF EF 15-20% with ICD, CKD IV baseline Cr 3.0, and cirrhosis who presented with several weeks progressive swelling, about 30 pound weight gain. Pharmacy asked to dose coumadin  INR 3.1 today  Goal of Therapy:  INR 2-3 Monitor platelets by anticoagulation protocol: Yes   Plan:  No coumadin today Daily PT-INR Monitor for S/S of bleeding  Isac Sarna, BS [place], BCPS Clinical Pharmacist Pager [phone] 03/08/2019,11:14 AM

## 2019-03-08 NOTE — ED Notes (Signed)
Report to Ar[place]us Leas, [place]

## 2019-03-08 NOTE — ED Notes (Signed)
Received report from [employer]. Pt resting. Nad. No needs at this time. No resp distress or labored breathing noted. denies pain. Aware awiting room assignment

## 2019-03-08 NOTE — ED Notes (Signed)
[place], MD messaged re: pt Sodium 126 this am, without urine output, HR 60, sys in 90s, this RN is asking if Lasix order for 160 mg over 1 hour should be held, care handoff given to oncoming RN

## 2019-03-08 NOTE — ED Notes (Signed)
After using BSC in room and getting back to stretcher, pt oxygen saturation dropped to 86% on RA with nurse tech. Not responsive to 3 liters [place], increased to 88% only after several minutes. Pt oxygen increased to 4 L [place] and increased to 91%. Pt maintaining 91-93% on 4 L [place]

## 2019-03-08 NOTE — Consult Note (Addendum)
Advanced Heart Failure Team Consult Note   Primary Physician: [name], MD PCP-Cardiologist:  Kate Sable, MD  EP: Dr. Lovena Le [facility]: New (Dr. Aundra Dubin)  Reason for Consultation: Acute on Chronic Systolic HF w/ Suspected Low Output. Consideration for advanced therapies   HPI:    [name] presents as a transfer from [facility] for further management of acute on chronic systolic heart failure w/ progressive renal failure. She is being seen for advanced heart failure consultation for consideration for inotropic therapy, at the request of Dr. Loleta Books, Internal Medicine.   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 Last echocardiogram 10/2017 w/ EF 15-20% w/ diffuse hypokinesis, mild LVH w/ doppler parameters c/w restrictive physiology. RV was mildly dilated w/ normal systolic function. Followed in [facility] by Dr. Bronson Ing. Dr. Lovena Le follows ICD. Last in clinic device check 8/20 demonstrated she was bi-ventricularly pacing 97% of the time.   Presented to [facility] 03/07/19 w/ complaints of worsening lower extremity and abdominal edema w/ 30 lb wt gain. Progressive symptoms and edema since the Thanksgiving holiday. Was recently changed from PO Lasix to torsemide given worsening symptoms. Labs in the ED notable for acute on chronic renal failure w/ SCr up to 5.04 (3.10 1 month prior). GFR 10. K 5.3. Na 127. BNP 1,072. Hs Trop 28>>27. CBC w/ anemia. Hgb down from 12.1>>10.9 over 1 mo period. INR 3.0. CXR shows cardiomegaly but no edema. Initial BP in the ED borderline hypotensive, 96/61. Received an initial dose of IV Lasix w/ poor urinary response. Admitted by IM and given additional IV Lasix 160 mg x 1. Transferred to [facility] for nephrology  and advanced heart failure consultations.     Cardiac Studies  Echo 03/2017  Left ventricle: The cavity size was normal. Wall thickness was   increased in a pattern of mild LVH. Systolic function was   severely reduced. The estimated ejection fraction was in the   range of 15% to 20%. Diffuse hypokinesis. Doppler parameters are   consistent with restrictive physiology, indicative of decreased   left ventricular diastolic compliance and/or increased left   atrial pressure. Doppler parameters are consistent with high   ventricular filling pressure. - Aortic valve: Mildly calcified annulus. Trileaflet; mildly   thickened leaflets. Valve area (VTI): 1.62 cm^2. Valve area   (Vmax): 2.01 cm^2. - Left atrium: The atrium was severely dilated. - Right ventricle: The cavity size was mildly dilated. - Right atrium: The atrium was moderately dilated. - Tricuspid valve: There was moderate regurgitation. - Pulmonary arteries: Systolic pressure was moderately increased.   PA peak pressure: 48 mm Hg (S). - Technically adequate study.  Review of Systems: [y] = yes, [ ]  = no   . General: Weight gain [ ] ; Weight loss [ ] ; Anorexia [ ] ; Fatigue [ ] ; Fever [ ] ; Chills [ ] ; Weakness [ ]   . Cardiac: Chest pain/pressure [ ] ; Resting SOB [ ] ; Exertional SOB [ ] ; Orthopnea [ ] ; Pedal Edema [ ] ; Palpitations [ ] ; Syncope [ ] ; Presyncope [ ] ; Paroxysmal nocturnal dyspnea[ ]   . Pulmonary: Cough [ ] ; Wheezing[ ] ; Hemoptysis[ ] ; Sputum [ ] ; Snoring [ ]   . GI: Vomiting[ ] ; Dysphagia[ ] ; Melena[ ] ; Hematochezia [ ] ; Heartburn[ ] ; Abdominal pain [ ] ;  Constipation [ ] ; Diarrhea [ ] ; BRBPR [ ]   . GU: Hematuria[ ] ; Dysuria [ ] ; Nocturia[ ]   . Vascular: Pain in legs with walking [ ] ; Pain in feet with lying flat [ ] ; Non-healing sores [ ] ; Stroke [ ] ; TIA [ ] ; Slurred speech [ ] ;  . Neuro: Headaches[ ] ; Vertigo[ ] ; Seizures[ ] ; Paresthesias[ ] ;Blurred vision [ ] ; Diplopia [ ] ; Vision changes [ ]   . Ortho/Skin: Arthritis  [ ] ; Joint pain [ ] ; Muscle pain [ ] ; Joint swelling [ ] ; Back Pain [ ] ; Rash [ ]   . Psych: Depression[ ] ; Anxiety[ ]   . Heme: Bleeding problems [ ] ; Clotting disorders [ ] ; Anemia [ ]   . Endocrine: Diabetes [ ] ; Thyroid dysfunction[ ]   Home Medications Prior to Admission medications   Medication Sig Start Date End Date Taking? Authorizing Provider  ACCU-CHEK GUIDE test strip 1 each by Other route 4 (four) times daily.  02/25/19  Yes [provider]  acetaminophen (TYLENOL) 500 MG tablet Take 500 mg by mouth every 6 (six) hours as needed for moderate pain.   Yes [provider]  Alcohol Swabs (ALCOHOL PREP) PADS 1 each by Does not apply route daily. 02/11/18  Yes Elayne Snare, MD  allopurinol (ZYLOPRIM) 100 MG tablet Take 150 mg by mouth every morning. Reported on 06/15/2015   Yes [provider]  benazepril (LOTENSIN) 10 MG tablet Take 10 mg by mouth daily.   Yes [provider]  calcitRIOL (ROCALTROL) 0.25 MCG capsule Take 0.5 mcg by mouth daily. Take 2 tablets by mouth once daily.   Yes [provider]  cholecalciferol (VITAMIN D3) 25 MCG (1000 UT) tablet Take 1,000 Units by mouth daily.   Yes [provider]  COREG CR 40 MG 24 hr capsule TAKE ONE CAPSULE BY MOUTH DAILY. 11/24/17  Yes Evans Lance, MD  digoxin (LANOXIN) 0.125 MG tablet TAKE 1 TABLET BY MOUTH DAILY EXCEPT ON SATURDAY AND SUNDAY. Patient taking differently: Take 0.125 mg by mouth daily. Except on Saturday and Sunday 11/27/18  Yes Herminio Commons, MD  insulin NPH Human (HUMULIN N) 100 UNIT/ML injection Inject 0.34 mLs (34 Units total) into the skin daily. Patient taking differently: Inject 22 Units into the skin daily.  07/22/18  Yes Elayne Snare, MD  Insulin Pen Needle 31G X 5 MM MISC 1 each by Does not apply route daily. Use as instructed to check blood sugar 4 times daily. 02/11/18  Yes Elayne Snare, MD  Insulin Syringe-Needle U-100 30G 1 ML MISC 1 each by Does not apply  route daily. Use needle to inject insulin 3 times daily. 02/13/18  Yes Elayne Snare, MD  LITETOUCH PEN NEEDLES 31G X 8 MM MISC USE AS DIRECTED UP TO 4 TIMES DAILY. Patient taking differently: See admin instructions.  01/27/19  Yes Elayne Snare, MD  meclizine (ANTIVERT) 12.5 MG tablet Take 12.5 mg by mouth 3 (three) times daily as needed. 02/11/19  Yes [provider]  metolazone (ZAROXOLYN) 2.5 MG tablet Take 2.5 mg by mouth daily as needed. 02/01/19  Yes [provider]  NOVOLOG FLEXPEN 100 UNIT/ML FlexPen INJECT SUBCUTANEOUSLY 12 UNITS BEFORE BREAKFAST; 14 UNITS AT LUNCH; AND 16 UNITS AT DINNER. Patient taking differently: See admin instructions. INJECT SUBCUTANEOUSLY 12 UNITS BEFORE BREAKFAST; 14 UNITS AT LUNCH; AND 16 UNITS AT [facility] 01/27/19  Yes Elayne Snare, MD  SURE COMFORT INSULIN SYRINGE 31G X 5/16" 0.5 ML MISC INJECT ONCE DAILY AS DIRECTED. Patient taking differently: See admin  instructions. INJECT ONCE DAILY AS DIRECTED. 01/27/19  Yes Elayne Snare, MD  torsemide (DEMADEX) 20 MG tablet Take 1 tablet (20 mg total) by mouth 2 (two) times daily. 03/05/19 06/03/19 Yes Herminio Commons, MD  traMADol (ULTRAM) 50 MG tablet Take 1 tablet (50 mg total) by mouth 2 (two) times daily as needed. 03/03/19  Yes Derek Jack, MD  TRESIBA FLEXTOUCH 200 UNIT/ML SOPN INJECT 59 UNITS INTO THE SKIN DAILY BEFORE BREAKFAST. Patient taking differently: Inject 56 Units into the skin daily before breakfast.  10/25/18  Yes Elayne Snare, MD  VICTOZA 18 MG/3ML SOPN INJECT 1.2MG  SUBCUTANEOUSLY DAILY AS DIRECTED. Patient taking differently: Inject 1.2 mg into the skin daily. INJECT 1.2MG  SUBCUTANEOUSLY DAILY AS DIRECTED. 01/27/19  Yes Elayne Snare, MD  warfarin (COUMADIN) 5 MG tablet TAKE 1/2 DAILY EXCEPT ON WEDNESDAY TAKE 1 TABLET. Patient taking differently: Take 2.5 mg by mouth daily. TAKE 1/2 DAILY. 06/30/18  Yes Herminio Commons, MD  diclofenac Sodium (VOLTAREN) 1 % GEL APPLY TO AFFECTED AREASETWICE  DAILY. 02/23/19   [provider]    Past Medical History: Past Medical History:  Diagnosis Date  . Automatic implantable cardioverter-defibrillator in situ    a. 03/2013: BSX Energen CRTD BiV ICC, ser# [id number]  . Chronic anticoagulation    a. coumadin.  . Chronic atrial fibrillation (HCC)    embolic rind  . Chronic kidney disease    creatinine- 1.8 in 09/2006 and 2.0 in 05/2007; 2.05 in 2011, 2.29 in 2012  . Chronic systolic CHF (congestive heart failure) ([place])    a. 03/2013 Echo: Sev LV dysfxn with sev diff HK, restrictive phys, diast dysfxn, mild MR, sev dil LA/RA, mod PR, PASP 23mmHg.  . Congenital third degree heart block    a. Guidant VVI pacemaker implanted in 09/1997; b. gen change in 01/2004;  c. 03/2013 upgrade to [place], ser# [id number].  . Dizziness and giddiness 05/19/2012  . Dysphagia 08/14/2011   FEB 2013 EGD/DIL 16 MM; GERD; h/o gastroesophageal reflux disease; + H. Pylori gastritis   . GERD (gastroesophageal reflux disease)   . Helicobacter pylori gastritis 08/14/2011  . IDDM (insulin dependent diabetes mellitus)   . Kidney stones 1990's  . Nonischemic cardiomyopathy ([place])    a. 03/2013 Echo: Sev LV dysfxn with sev diff HK, restrictive phys, diast dysfxn, mild MR, sev dil LA/RA, mod PR, PASP 28mmHg;  b. 03/2013: BSX Energen CRTD BiV ICC, ser# [id number].  Marland Kitchen Potassium (K) excess   . Stroke [facility]) 1999   denies residual on 04/21/2013    Past Surgical History: Past Surgical History:  Procedure Laterality Date  . BI-VENTRICULAR IMPLANTABLE CARDIOVERTER DEFIBRILLATOR  (CRT-D)  04/21/2013  . BI-VENTRICULAR IMPLANTABLE CARDIOVERTER DEFIBRILLATOR UPGRADE N/A 04/21/2013   Procedure: BI-VENTRICULAR IMPLANTABLE CARDIOVERTER DEFIBRILLATOR UPGRADE;  Surgeon: Evans Lance, MD;  [facility] C[facility] LAB;  Service: Cardiovascular;  Laterality: N/A;  . COLONOSCOPY  04/23/2011   VZD:GLOVFIEPPI COLON POLYP/ INTERNAL HEMORRHOIDS/ TORTUOUS COLON  . INSERT / REPLACE / REMOVE  PACEMAKER  09/1997   Guidant VVI [employer]  . PACEMAKER GENERATOR CHANGE  01/2004  . UPPER GASTROINTESTINAL ENDOSCOPY  FEB 2013   RING/DIL TO 16 MM, H PYLORI GASTRITIS    Family History: Family History  Adopted: Yes  Problem Relation Age of Onset  . Autism Son   . Seizures Son   . Diabetes Daughter   . Colon cancer Neg Hx   . Colon polyps Neg Hx     Social History: Social History  Socioeconomic History  . Marital status: Married    Spouse name: Not on file  . Number of children: 4  . Years of education: Not on file  . Highest education level: Not on file  Occupational History    Employer: UNEMPLOYED  Tobacco Use  . Smoking status: Never Smoker  . Smokeless tobacco: Never Used  . Tobacco comment: Never smoked  Substance and Sexual Activity  . Alcohol use: No    Alcohol/week: 0.0 standard drinks  . Drug use: No  . Sexual activity: Not Currently    Birth control/protection: Post-menopausal  Other Topics Concern  . Not on file  Social History Narrative  . Not on file   Social Determinants of Health   Financial Resource Strain:   . Difficulty of Paying Living Expenses: Not on file  Food Insecurity:   . Worried About Charity fundraiser in the Last Year: Not on file  . Ran Out of Food in the Last Year: Not on file  Transportation Needs:   . Lack of Transportation (Medical): Not on file  . Lack of Transportation (Non-Medical): Not on file  Physical Activity:   . Days of Exercise per Week: Not on file  . Minutes of Exercise per Session: Not on file  Stress:   . Feeling of Stress : Not on file  Social Connections:   . Frequency of Communication with Friends and Family: Not on file  . Frequency of Social Gatherings with Friends and Family: Not on file  . Attends Religious Services: Not on file  . Active Member of Clubs or Organizations: Not on file  . Attends Archivist Meetings: Not on file  . Marital Status: Not on file    Allergies:    Allergies  Allergen Reactions  . Wheat Swelling  . Latex Rash  . Penicillins Rash  . Sulfa Antibiotics Rash    Objective:    Vital Signs:   Temp:  [97.7 F (36.5 C)] 97.7 F (36.5 C) (01/10 2007) Pulse Rate:  [58-60] 59 (01/11 0830) Resp:  [14-25] 18 (01/11 0830) BP: (96-103)/(61-80) 103/74 (01/11 0830) SpO2:  [84 %-100 %] 98 % (01/11 0830) Weight:  [90.7 kg] 90.7 kg (01/10 2008)    Weight change: Filed Weights   03/07/19 2008  Weight: 90.7 kg    Intake/Output:  No intake or output data in the 24 hours ending 03/08/19 1002    Physical Exam    General:  Obese, middle aged AAF. Well appearing. No resp difficulty HEENT: normal Neck: supple. Elevated JVP to ear . Carotids 2+ bilat; no bruits. No lymphadenopathy or thyromegaly appreciated. Cor: PMI nondisplaced. Regular rate & rhythm. No rubs, gallops or murmurs. Lungs: clear Abdomen: obese, RQ tenderness, mildly distended distended. No hepatosplenomegaly. No bruits or masses. Good bowel sounds. Extremities: no cyanosis, clubbing, rash, 2+ bilateral LE edema. Cool distal extremities  Neuro: alert & orientedx3, cranial nerves grossly intact. moves all 4 extremities w/o difficulty. Affect pleasant   Telemetry   Paced rhythm, 80s    Labs   Basic Metabolic Panel: Recent Labs  Lab 03/07/19 2101 03/08/19 0927  NA 127* 126*  K 5.3* 5.2*  CL 94* 93*  CO2 20* 17*  GLUCOSE 77 49*  BUN 118* PENDING  CREATININE 5.04* 4.97*  CALCIUM 8.4* 7.9*    Liver Function Tests: Recent Labs  Lab 03/07/19 2101 03/08/19 0927  AST 23 24  ALT 17 16  ALKPHOS 324* 303*  BILITOT 3.0* 3.3*  PROT 7.7  7.3  ALBUMIN 2.9* 2.7*   No results for input(s): LIPASE, AMYLASE in the last 168 hours. No results for input(s): AMMONIA in the last 168 hours.  CBC: Recent Labs  Lab 03/07/19 Jun 09, 2099 03/08/19 0927  WBC 6.8 7.2  NEUTROABS 5.5  --   HGB 10.9* 10.8*  HCT 33.3* 32.8*  MCV 84.9 83.7  PLT 139* 123*    Cardiac Enzymes: No  results for input(s): CKTOTAL, CKMB, CKMBINDEX, TROPONINI in the last 168 hours.  BNP: BNP (last 3 results) Recent Labs    03/07/19 2101  BNP 1,072.0*    ProBNP (last 3 results) No results for input(s): PROBNP in the last 8760 hours.   CBG: No results for input(s): GLUCAP in the last 168 hours.  Coagulation Studies: Recent Labs    03/07/19 06-10-99 03/08/19 0927  LABPROT 31.3* 31.7*  INR 3.0* 3.1*     Imaging   CT ABDOMEN PELVIS WO CONTRAST  Result Date: 03/08/2019 CLINICAL DATA:  Abdominal distension EXAM: CT ABDOMEN AND PELVIS WITHOUT CONTRAST TECHNIQUE: Multidetector CT imaging of the abdomen and pelvis was performed following the standard protocol without IV contrast. COMPARISON:  None. FINDINGS: LOWER CHEST: Cardiomegaly bibasilar atelectasis. HEPATOBILIARY: Normal hepatic contours. No intra- or extrahepatic biliary dilatation. There is cholelithiasis without acute inflammation. PANCREAS: Normal pancreas. No ductal dilatation or peripancreatic fluid collection. SPLEEN: Normal. ADRENALS/URINARY TRACT: The adrenal glands are normal. No hydronephrosis, nephroureterolithiasis or solid renal mass. The urinary bladder is normal for degree of distention STOMACH/BOWEL: There is no hiatal hernia. Normal duodenal course and caliber. No small bowel dilatation or inflammation. No focal colonic abnormality. Not visualized. No right lower quadrant inflammation or free fluid. VASCULAR/LYMPHATIC: There is calcific atherosclerosis of the abdominal aorta. No abdominal or pelvic lymphadenopathy. REPRODUCTIVE: Normal uterus and ovaries. MUSCULOSKELETAL. No bony spinal canal stenosis or focal osseous abnormality. OTHER: Anasarca. IMPRESSION: 1. No acute abnormality of the abdomen or pelvis. 2. Cardiomegaly and calcific aortic atherosclerosis (ICD10-I70.0). 3. Diffuse anasarca. Electronically Signed   By: Ulyses Jarred M.D.   On: 03/08/2019 02:02   CT FEMUR RIGHT WO CONTRAST  Result Date:  03/08/2019 CLINICAL DATA:  Upper leg trauma, right thigh swelling EXAM: CT OF THE LOWER RIGHT EXTREMITY WITHOUT CONTRAST TECHNIQUE: Multidetector CT imaging of the right lower extremity was performed according to the standard protocol. COMPARISON:  None. FINDINGS: Bones/Joint/Cartilage No fracture or dislocation. There is moderate right hip osteoarthritis with superior joint space loss and marginal osteophyte formation. No periosteal reaction or cortical destruction. No large knee or hip joint effusion is seen. Ligaments Suboptimally assessed by CT. Muscles and Tendons The enthesopathy seen at the hamstrings insertion site. The remainder of the visualized portion of the tendons appear to be intact. The muscles surrounding the thigh appear to be intact. Soft tissues There is diffuse skin thickening and subcutaneous edema seen surrounding the right lower extremity. Overlying the greater trochanter adjacent to the deep fascial layer there is thin linear area of fluid. The fluid does not appear to be encapsulated however. Dense vascular calcifications seen within the deep pelvis. IMPRESSION: 1. No acute osseous abnormality or soft tissue hematoma. 2. Thin non loculated fluid seen adjacent to the deep fascial layers/iliotibial tibial tract overlying the greater trochanter which could represent a posttraumatic seroma/Mild Sherry Ruffing lesion. 3. Diffuse subcutaneous edema and skin thickening surrounding the right lower extremity. Electronically Signed   By: Prudencio Pair M.D.   On: 03/08/2019 02:09   DG Chest Portable 1 View  Result Date: 03/07/2019 CLINICAL DATA:  Shortness of breath, weakness EXAM: PORTABLE CHEST 1 VIEW COMPARISON:  06/25/2017 FINDINGS: Left AICD remains in place, unchanged. Cardiomegaly. No confluent opacities or effusions. No edema. No acute bony abnormality. IMPRESSION: Cardiomegaly.  No acute cardiopulmonary disease. Electronically Signed   By: Rolm Baptise M.D.   On: 03/07/2019 21:04       Medications:     Current Medications: . calcitRIOL  0.5 mcg Oral Daily  . digoxin  0.125 mg Oral Daily  . insulin aspart  0-5 Units Subcutaneous QHS  . insulin aspart  0-9 Units Subcutaneous TID WC  . sodium chloride flush  3 mL Intravenous Q12H     Infusions: . sodium chloride    . furosemide    . 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   Assessment/Plan   1. Acute on Chronic Systolic Heart Failure: past records indicate NICM. No records of LHC/nuclear perfusion imaging on file.  Last known EF 15-20% by echocardiogram in 2019, w/ diffuse hypokinesis, mild LVH w/ doppler parameters c/w restrictive physiology. RV was mildly dilated w/ normal systolic function. Has CRT-D. Now w/ progressive volume overload/anasarca w/ poor response to PO diuretics. Now w/ worsening renal failure w/ rise in SCr from prior baseline of 3.0>>5.0, likely cardiorenal. Concerned for for low output w/ worsening renal failure and cool distal extremities  - will need trial of high dose diuretics, IV lasix 160 mg TID + empiric inotropic therapy. Given degree of renal failure, would opt for Dobutamine 2.5 mcg. Will place PICC line for daily Co-ox and CVP monitoring (not a candidate for HD). Anticipate RHC to assess filling pressure CO/CI. No LHC given CKD. Will try to arrange [facility] for tomorrow.  - Repeat 2D Echo to reassess EF and RV (normal RV on prior echo) - will discontinue home digoxin due worsening renal function.   - hold home  blocker (coreg) due to suspected low output  - no ACE/ARB/ ARNI nor spiro w/ CKD - BP too soft  for BiDil  - not a candidate for SGLT2i w/ GFR < 30  - already has CRT-D - w/ MGUS, ? Cardiac amyloidosis. Previous echocardiogram w/ mild LVH w/ doppler parameters c/w restrictive physiology. Also w/ severe bi atrial enlargement and chronic afib. Unable to get cardiac MRI given SCr. Assess LV morphology with repeat echo.    2. AKI on CKD, Stage 4: baseline SCr 3.0-3.6. 5.0 on admit, in the setting of massive volume overload and severe systolic HF w/ EF ~39-76%. CT of abdomen w/o contrast negative for hydronephrosis, nephroureterolithiasis or solid renal mass. Suspect cardiorenal syndrome. - Continue high dose IV Lasix - start inotropic therapy w/ dobutamine 2.5 mcg - daily BMPs - nephrology following.   3. Hyperkalemia: K 5.3, in the setting of worsening renal failure - IV Lasix ordered  - monitor closely. If failure to improve, may need iHD.   4. Hyponatremia: Na 126. Suspect hypervolemic hyponatremia - Diuresis per above  - Fluid Restrict   5. Chronic Atrial Fibrillation:  - hold home coreg given suspected low output  - hold tonight's dose of coumadin for possible RHC tomorrow (  INR 3.1) - monitor closely on tele if dobutamine initiated. May need short term IV amiodarone for rate control - digoxin on hold given elevated SCr   6. Anemia: Hgb down from 12>10.8. May be due to CKD. INR 3.1 - monitor for signs of bleeding   7. IDDM:  - management per primary team.  - not a candidate for SGLT2i w/ GFR < 30   8. MGUS:  - Followed by hematology  - given severity of HF, ? Potential Amyloidosis. Previous echocardiogram w/ mild LVH w/ doppler parameters c/w restrictive physiology. Also w/ severe bi atrial enlargement and chronic afib. Unable to get cardiac MRI given SCr. Echo for LV morphology. .  Length of Stay: 0  Lyda Jester, PA-C  03/08/2019, 10:02 AM  Advanced Heart Failure Team Pager [phone] (M-F; 7a - 4p)  Please contact [facility] for night-coverage after hours  (4p -7a ) and weekends on [email or web address]   Patient seen with PA, agree with the above note.   Patient has history of congenital CHB s/p PPM with upgrade to [place] CRT-D, nonischemic cardiomyopathy, diabetes, chronic atrial fibrillation and CKD stage 3 with baseline creatinine around 3.  She is followed by nephrology in [place].  Last echo in 2019 showed EF 15-20%.    Progressive dyspnea recently with 30 lb weight gain.  Seen in the ER at [facility] for this reason and admitted. BMET showed BUN > 100 and creatinine up to 5. SBP 90s.  She has responded poorly so far to IV Lasix.  She was transferred to [facility] for further management.   General: NAD Neck: JVP 14-16 cm, no thyromegaly or thyroid nodule.  Lungs: Clear to auscultation bilaterally with normal respiratory effort. CV: Nondisplaced PMI.  Heart regular S1/S2, +S3. 3/6 HSM LLSB/apex.  1+ edema top knees.   Abdomen: Soft, nontender, no hepatosplenomegaly, no distention.  Skin: Intact without lesions or rashes.  Neurologic: Alert and oriented x 3.  Psych: Normal affect. Extremities: No clubbing or cyanosis.  HEENT: Normal.   1. Acute on chronic systolic CHF: Longstanding NICM.  She has [employer] CRT-D device.  Now admitted with progressive dyspnea and 30 lb weight gain. She is very volume overloaded on exam, complicated by SBP in 84Z and AKI on CKD stage 3.  Poor response so far to IV Lasix, nephrology is following.  - I will empirically start dobutamine 2.5 mcg/kg/min today.  - Lasix 160 mg IV every 8 hrs.  - RHC planned for tomorrow.  Discussed risks/benefits with patient and she agrees to procedure.  - Will get echo.  - Stop digoxin and benazepril with AKI.  - Stop beta blocker with concern for low output.  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  2. AKI on CKD stage 3: Baseline creatinine around 3, now up to 5 with BUN > 100.  Suspect cardiorenal syndrome  superimposed on possible diabetic nephropathy.  She is unfortunately very volume overloaded as well.  - Nephrology following.  - High dose IV Lasix + dobutamine to support CO.  If no response, may need CVVH.  3. Chronic atrial fibrillation: Warfarin held for procedures, INR 3.1.  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  5. DM: Per primary.  6. MGUS: As above, will assess LV morphology to see if looks concerning for AL amyloidosis.   Loralie Champagne 03/08/2019

## 2019-03-08 NOTE — Progress Notes (Signed)
Patient does not want PICC line tonight.  State that she wants to talk to her doctor in the morning about it.  RN asked if she would like to speak with doctor tonight but she and her husband would rather wait until the morning because patient is tired.  Unable to get CVPs or Co-ox as result.   Patient and husband say that no one has discussed right heart catheterization that patient is scheduled for.  Again, patient would prefer to wait until the morning to talk to the doctor.  Unable to get consent at this time.

## 2019-03-08 NOTE — ED Notes (Signed)
Pt has approx 192ml of amber clear urine in suction

## 2019-03-08 NOTE — ED Notes (Signed)
Danford, MD call and pt is to receive Lasix infusion, Christy RN aware of plan

## 2019-03-08 NOTE — Progress Notes (Signed)
Order for PICC insertion. Discussed risks, benefits, and alternatives with patient and spouse. Both wanted to wait until AM and to speak with MD in AM. Primary RN notified.

## 2019-03-09 ENCOUNTER — Encounter (HOSPITAL_COMMUNITY): Payer: Self-pay | Admitting: Family Medicine

## 2019-03-09 ENCOUNTER — Ambulatory Visit (HOSPITAL_COMMUNITY): Payer: Medicaid Other

## 2019-03-09 ENCOUNTER — Inpatient Hospital Stay (HOSPITAL_COMMUNITY): Payer: Medicaid Other

## 2019-03-09 ENCOUNTER — Encounter (HOSPITAL_COMMUNITY): Payer: Self-pay

## 2019-03-09 ENCOUNTER — Encounter (HOSPITAL_COMMUNITY)
Admission: EM | Disposition: A | Discharge status: Home Health | Payer: Self-pay | Source: Home / Self Care | Attending: Internal Medicine

## 2019-03-09 DIAGNOSIS — D649 Anemia, unspecified: Secondary | ICD-10-CM

## 2019-03-09 DIAGNOSIS — D472 Monoclonal gammopathy: Secondary | ICD-10-CM

## 2019-03-09 DIAGNOSIS — I371 Nonrheumatic pulmonary valve insufficiency: Secondary | ICD-10-CM

## 2019-03-09 DIAGNOSIS — I509 Heart failure, unspecified: Secondary | ICD-10-CM

## 2019-03-09 DIAGNOSIS — N184 Chronic kidney disease, stage 4 (severe): Secondary | ICD-10-CM

## 2019-03-09 DIAGNOSIS — D696 Thrombocytopenia, unspecified: Secondary | ICD-10-CM

## 2019-03-09 DIAGNOSIS — I482 Chronic atrial fibrillation, unspecified: Secondary | ICD-10-CM

## 2019-03-09 DIAGNOSIS — N189 Chronic kidney disease, unspecified: Secondary | ICD-10-CM

## 2019-03-09 DIAGNOSIS — I361 Nonrheumatic tricuspid (valve) insufficiency: Secondary | ICD-10-CM

## 2019-03-09 HISTORY — PX: RIGHT HEART CATH: CATH118263

## 2019-03-09 LAB — POCT I-STAT EG7
Acid-base deficit: 4 mmol/L — ABNORMAL HIGH (ref 0.0–2.0)
Acid-base deficit: 5 mmol/L — ABNORMAL HIGH (ref 0.0–2.0)
Bicarbonate: 19.1 mmol/L — ABNORMAL LOW (ref 20.0–28.0)
Bicarbonate: 20.2 mmol/L (ref 20.0–28.0)
Calcium, Ion: 0.9 mmol/L — ABNORMAL LOW (ref 1.15–1.40)
Calcium, Ion: 0.93 mmol/L — ABNORMAL LOW (ref 1.15–1.40)
HCT: 33 % — ABNORMAL LOW (ref 36.0–46.0)
HCT: 34 % — ABNORMAL LOW (ref 36.0–46.0)
Hemoglobin: 11.2 g/dL — ABNORMAL LOW (ref 12.0–15.0)
Hemoglobin: 11.6 g/dL — ABNORMAL LOW (ref 12.0–15.0)
O2 Saturation: 62 %
O2 Saturation: 64 %
Potassium: 4.5 mmol/L (ref 3.5–5.1)
Potassium: 4.6 mmol/L (ref 3.5–5.1)
Sodium: 128 mmol/L — ABNORMAL LOW (ref 135–145)
Sodium: 129 mmol/L — ABNORMAL LOW (ref 135–145)
TCO2: 20 mmol/L — ABNORMAL LOW (ref 22–32)
TCO2: 21 mmol/L — ABNORMAL LOW (ref 22–32)
pCO2, Ven: 32.7 mmHg — ABNORMAL LOW (ref 44.0–60.0)
pCO2, Ven: 34.2 mmHg — ABNORMAL LOW (ref 44.0–60.0)
pH, Ven: 7.375 (ref 7.250–7.430)
pH, Ven: 7.379 (ref 7.250–7.430)
pO2, Ven: 32 mmHg (ref 32.0–45.0)
pO2, Ven: 34 mmHg (ref 32.0–45.0)

## 2019-03-09 LAB — ECHOCARDIOGRAM COMPLETE
Height: 66 in
Weight: 3174.62 oz

## 2019-03-09 LAB — GLUCOSE, CAPILLARY
Glucose-Capillary: 150 mg/dL — ABNORMAL HIGH (ref 70–99)
Glucose-Capillary: 102 mg/dL — ABNORMAL HIGH (ref 70–99)
Glucose-Capillary: 83 mg/dL (ref 70–99)
Glucose-Capillary: 189 mg/dL — ABNORMAL HIGH (ref 70–99)

## 2019-03-09 LAB — BASIC METABOLIC PANEL
Anion gap: 16 — ABNORMAL HIGH (ref 5–15)
BUN: 129 mg/dL — ABNORMAL HIGH (ref 6–20)
CO2: 18 mmol/L — ABNORMAL LOW (ref 22–32)
Calcium: 7.9 mg/dL — ABNORMAL LOW (ref 8.9–10.3)
Chloride: 93 mmol/L — ABNORMAL LOW (ref 98–111)
Creatinine, Ser: 5.09 mg/dL — ABNORMAL HIGH (ref 0.44–1.00)
GFR calc Af Amer: 10 mL/min — ABNORMAL LOW (ref >60)
GFR calc non Af Amer: 9 mL/min — ABNORMAL LOW (ref >60)
Glucose, Bld: 201 mg/dL — ABNORMAL HIGH (ref 70–99)
Potassium: 5.2 mmol/L — ABNORMAL HIGH (ref 3.5–5.1)
Sodium: 127 mmol/L — ABNORMAL LOW (ref 135–145)

## 2019-03-09 LAB — CBC WITH DIFFERENTIAL/PLATELET
Abs Immature Granulocytes: 0.04 10*3/uL (ref 0.00–0.07)
Basophils Absolute: 0 10*3/uL (ref 0.0–0.1)
Basophils Relative: 0 %
Eosinophils Absolute: 0.1 10*3/uL (ref 0.0–0.5)
Eosinophils Relative: 1 %
HCT: 30.1 % — ABNORMAL LOW (ref 36.0–46.0)
Hemoglobin: 10.3 g/dL — ABNORMAL LOW (ref 12.0–15.0)
Immature Granulocytes: 1 %
Lymphocytes Relative: 8 %
Lymphs Abs: 0.6 10*3/uL — ABNORMAL LOW (ref 0.7–4.0)
MCH: 27.7 pg (ref 26.0–34.0)
MCHC: 34.2 g/dL (ref 30.0–36.0)
MCV: 80.9 fL (ref 80.0–100.0)
Monocytes Absolute: 0.5 10*3/uL (ref 0.1–1.0)
Monocytes Relative: 7 %
Neutro Abs: 6.2 10*3/uL (ref 1.7–7.7)
Neutrophils Relative %: 83 %
Platelets: 133 10*3/uL — ABNORMAL LOW (ref 150–400)
RBC: 3.72 MIL/uL — ABNORMAL LOW (ref 3.87–5.11)
RDW: 16.4 % — ABNORMAL HIGH (ref 11.5–15.5)
WBC: 7.5 10*3/uL (ref 4.0–10.5)
nRBC: 0.7 % — ABNORMAL HIGH (ref 0.0–0.2)

## 2019-03-09 LAB — MRSA PCR SCREENING: MRSA by PCR: NEGATIVE

## 2019-03-09 LAB — PROTIME-INR
INR: 3.1 — ABNORMAL HIGH (ref 0.8–1.2)
Prothrombin Time: 32.1 seconds — ABNORMAL HIGH (ref 11.4–15.2)

## 2019-03-09 SURGERY — RIGHT HEART CATH
Anesthesia: LOCAL

## 2019-03-09 MED ORDER — LIDOCAINE HCL (PF) 1 % IJ SOLN
INTRAMUSCULAR | Status: AC
  Filled 2019-03-09: qty 30

## 2019-03-09 MED ORDER — PERFLUTREN LIPID MICROSPHERE
1.0000 mL | INTRAVENOUS | Status: AC | PRN
  Administered 2019-03-09: 2 mL via INTRAVENOUS
  Filled 2019-03-09: qty 10

## 2019-03-09 MED ORDER — LIDOCAINE HCL (PF) 1 % IJ SOLN
INTRAMUSCULAR | Status: DC | PRN
  Administered 2019-03-09: 2 mL

## 2019-03-09 MED ORDER — HEPARIN (PORCINE) IN NACL 1000-0.9 UT/500ML-% IV SOLN
INTRAVENOUS | Status: AC
  Filled 2019-03-09: qty 500

## 2019-03-09 SURGICAL SUPPLY — 6 items
CATH BALLN WEDGE 5F 110CM (CATHETERS) ×1 IMPLANT
PACK CARDIAC CATHETERIZATION (CUSTOM PROCEDURE TRAY) ×2 IMPLANT
SHEATH GLIDE SLENDER 4/5FR (SHEATH) ×1 IMPLANT
TRANSDUCER W/STOPCOCK (MISCELLANEOUS) ×2 IMPLANT
TUBING ART PRESS 72  MALE/FEM (TUBING) ×1
TUBING ART PRESS 72 MALE/FEM (TUBING) IMPLANT

## 2019-03-09 NOTE — Progress Notes (Signed)
PIV consult: Arrived to room, pt already has second site. Discussed with April, RN: Cancel consult.

## 2019-03-09 NOTE — Consult Note (Addendum)
[place]  [phone] [phone]   MEDICAL ONCOLOGY - INITIAL CONSULTATION  Referral MD: Dr. 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 is a 59 year old female with a past medical history of chronic atrial fibrillation maintained on warfarin, complete heart block status post pacemaker/ICD placement, chronic systolic heart failure with ejection fraction of 15 to 20%, thrombocytopenia, liver cirrhosis.  The patient presented to the hospital with increased swelling in her legs and abdomen.  Work-up in the emergency room was significant for a creatinine of 5.04, sodium 127, and BNP 1072.  The patient is currently being followed by cardiology and nephrology.  She is currently receiving dobutamine and Lasix.  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  Last visit was on 03/03/2019.  Prior work-up and recommendations have been reviewed.  At her last visit, it was recommended that she undergo further testing with immunofixation, free light chains, beta-2 microglobulin and a skeletal survey.  In addition, a 24-hour urine for total protein, UPEP, and urine immunofixation were also recommended.  The results of the studies are available to me in epic.  She was noted to have an elevated IgG level at 1917, beta-2 microglobulin was elevated at 9.6, kappa free light chains were elevated at 1392.3, lambda free light chains were elevated at 51.8, and kappa, lambda light chain ratio was elevated at 26.88.  24-hour urine showed elevated free kappa light chains at 260.46 and elevated kappa/lambda ratio at 136.37.  M spike in the urine was elevated at 22 and M spike percentage was 28.0.  Most recent M spike available to me was performed on 02/03/2019 and was elevated at 1.3.  Skeletal survey was completed on 03/03/2019  which showed no significant lytic lesions.  The patient reports that she has had swelling in her abdomen and legs on and off since Thanksgiving.  It became aggressively worse with redness in her lower extremities and discomfort which brought her into the hospital.  Patient reports that her abdomen feels "hard."  She has not had any fevers or chills.  Denies headaches and vision changes.  Denies chest pain but reports shortness of breath with exertion.  Denies nausea, vomiting, diarrhea.  Reports mild constipation.  She has bruised easily at IV and blood draw sites.  She has not noticed any epistaxis, hemoptysis, hematemesis, hematuria, melena, hematochezia.  She reports that she has good urine output.  The patient is married.  She has 4 children.  She lives in [place], [place].  Denies history of alcohol and tobacco use.  Medical oncology was asked see the patient to make recommendations regarding her abnormal SPEP, UPEP, and light chains.   Past Medical History:  Diagnosis Date  . Automatic implantable cardioverter-defibrillator in situ    a. 03/2013: BSX Energen CRTD BiV ICC, ser# [id number]  . Chronic anticoagulation    a. coumadin.  . Chronic atrial fibrillation (HCC)    embolic rind  . Chronic kidney disease    creatinine- 1.8 in 09/2006 and 2.0 in 05/2007; 2.05 in 2011, 2.29 in 2012  . Chronic systolic CHF (congestive heart failure) ([place])    a. 03/2013 Echo: Sev LV dysfxn with sev diff HK, restrictive phys, diast dysfxn, mild MR, sev dil LA/RA, mod PR, PASP 40mHg.  . Congenital third degree heart block    a. Guidant VVI  pacemaker implanted in 09/1997; b. gen change in 01/2004;  c. 03/2013 upgrade to [place], ser# [id number].  . Dizziness and giddiness 05/19/2012  . Dysphagia 08/14/2011   FEB 2013 EGD/DIL 16 MM; GERD; h/o gastroesophageal reflux disease; + H. Pylori gastritis   . GERD (gastroesophageal reflux disease)   . Helicobacter pylori gastritis 08/14/2011  . IDDM  (insulin dependent diabetes mellitus)   . Kidney stones 1990's  . Nonischemic cardiomyopathy ([place])    a. 03/2013 Echo: Sev LV dysfxn with sev diff HK, restrictive phys, diast dysfxn, mild MR, sev dil LA/RA, mod PR, PASP 17mHg;  b. 03/2013: BSX Energen CRTD BiV ICC, ser# [id number]  .Marland KitchenPotassium (K) excess   . Stroke ([facility] 1999   denies residual on 04/21/2013  :  Past Surgical History:  Procedure Laterality Date  . BI-VENTRICULAR IMPLANTABLE CARDIOVERTER DEFIBRILLATOR  (CRT-D)  04/21/2013  . BI-VENTRICULAR IMPLANTABLE CARDIOVERTER DEFIBRILLATOR UPGRADE N/A 04/21/2013   Procedure: BI-VENTRICULAR IMPLANTABLE CARDIOVERTER DEFIBRILLATOR UPGRADE;  Surgeon: GEvans Lance MD;  [facility];  Service: Cardiovascular;  Laterality: N/A;  . COLONOSCOPY  04/23/2011   SMVE:HMCNOBSJGGCOLON POLYP/ INTERNAL HEMORRHOIDS/ TORTUOUS COLON  . INSERT / REPLACE / REMOVE PACEMAKER  09/1997   Guidant VVI [employer]  . PACEMAKER GENERATOR CHANGE  01/2004  . UPPER GASTROINTESTINAL ENDOSCOPY  FEB 2013   RING/DIL TO 16 MM, H PYLORI GASTRITIS  :  Current Facility-Administered Medications  Medication Dose Route Frequency Provider Last Rate Last Admin  . 0.9 %  sodium chloride infusion  250 mL Intravenous PRN LOswald Hillock MD   Stopped at 03/09/19 0034  . 0.9 %  sodium chloride infusion  250 mL Intravenous PRN SLyda JesterM, PA-C      . 0.9 %  sodium chloride infusion   Intravenous Continuous SConsuelo Pandy PA-C 10 mL/hr at 03/09/19 08366Restarted at 03/09/19 0552  . acetaminophen (TYLENOL) tablet 650 mg  650 mg Oral Q6H PRN LOswald Hillock MD       Or  . acetaminophen (TYLENOL) suppository 650 mg  650 mg Rectal Q6H PRN LOswald Hillock MD      . calcitRIOL (ROCALTROL) capsule 0.5 mcg  0.5 mcg Oral Daily LOswald Hillock MD   0.5 mcg at 03/09/19 1035  . DOBUTamine (DOBUTREX) infusion 4000 mcg/mL  4 mcg/kg/min Intravenous Titrated MLarey Dresser MD 5.41 mL/hr at 03/09/19 0839 4 mcg/kg/min at  03/09/19 0839  . furosemide (LASIX) 160 mg in dextrose 5 % 50 mL IVPB  160 mg Intravenous TID DEdwin Dada MD 66 mL/hr at 03/09/19 1041 160 mg at 03/09/19 1041  . insulin aspart (novoLOG) injection 0-9 Units  0-9 Units Subcutaneous TID WC DEdwin Dada MD   1 Units at 03/09/19 0640  . ondansetron (ZOFRAN) tablet 4 mg  4 mg Oral Q6H PRN LOswald Hillock MD       Or  . ondansetron (ZOFRAN) injection 4 mg  4 mg Intravenous Q6H PRN LOswald Hillock MD      . sodium chloride flush (NS) 0.9 % injection 3 mL  3 mL Intravenous Q12H LOswald Hillock MD   3 mL at 03/08/19 2158  . sodium chloride flush (NS) 0.9 % injection 3 mL  3 mL Intravenous PRN LOswald Hillock MD      . sodium chloride flush (NS) 0.9 % injection 3 mL  3 mL Intravenous Q12H Simmons, Brittainy M, PA-C      .  sodium chloride flush (NS) 0.9 % injection 3 mL  3 mL Intravenous PRN Lyda Jester M, PA-C         Allergies  Allergen Reactions  . Wheat Swelling  . Latex Rash  . Penicillins Rash  . Sulfa Antibiotics Rash  :  Family History  Adopted: Yes  Problem Relation Age of Onset  . Autism Son   . Seizures Son   . Diabetes Daughter   . Colon cancer Neg Hx   . Colon polyps Neg Hx   :  Social History   Socioeconomic History  . Marital status: Married    Spouse name: Not on file  . Number of children: 4  . Years of education: Not on file  . Highest education level: Not on file  Occupational History    Employer: UNEMPLOYED  Tobacco Use  . Smoking status: Never Smoker  . Smokeless tobacco: Never Used  . Tobacco comment: Never smoked  Substance and Sexual Activity  . Alcohol use: No    Alcohol/week: 0.0 standard drinks  . Drug use: No  . Sexual activity: Not Currently    Birth control/protection: Post-menopausal  Other Topics Concern  . Not on file  Social History Narrative  . Not on file   Social Determinants of Health   Financial Resource Strain:   . Difficulty of Paying Living Expenses:  Not on file  Food Insecurity:   . Worried About Charity fundraiser in the Last Year: Not on file  . Ran Out of Food in the Last Year: Not on file  Transportation Needs:   . Lack of Transportation (Medical): Not on file  . Lack of Transportation (Non-Medical): Not on file  Physical Activity:   . Days of Exercise per Week: Not on file  . Minutes of Exercise per Session: Not on file  Stress:   . Feeling of Stress : Not on file  Social Connections:   . Frequency of Communication with Friends and Family: Not on file  . Frequency of Social Gatherings with Friends and Family: Not on file  . Attends Religious Services: Not on file  . Active Member of Clubs or Organizations: Not on file  . Attends Archivist Meetings: Not on file  . Marital Status: Not on file  Intimate Partner Violence:   . Fear of Current or Ex-Partner: Not on file  . Emotionally Abused: Not on file  . Physically Abused: Not on file  . Sexually Abused: Not on file  :  Review of Systems: A comprehensive 14 point review of systems was negative except as noted in the HPI.  Exam: Patient Vitals for the past 24 hrs:  BP Temp Temp src Pulse Resp SpO2 Height Weight  03/09/19 1142 (!) 90/56 97.7 F (36.5 C) Oral -- -- 98 % -- --  03/09/19 0700 99/67 97.8 F (36.6 C) Oral (!) 58 15 98 % -- --  03/09/19 0500 -- -- -- -- -- -- -- 198 lb 6.6 oz (90 kg)  03/09/19 0357 105/64 97.7 F (36.5 C) Oral (!) 59 18 95 % -- --  03/08/19 2343 -- 97.7 F (36.5 C) Oral -- -- -- -- --  03/08/19 2321 107/70 -- -- (!) 58 18 98 % -- --  03/08/19 1930 101/72 (!) 97.3 F (36.3 C) Oral (!) 59 19 96 % -- --  03/08/19 1719 -- 97.8 F (36.6 C) Oral -- -- 93 % 5' 6"  (1.676 m) 198 lb 10.2 oz (90.1 kg)  03/08/19 1600 107/65 -- -- (!) 59 16 98 % -- --  03/08/19 1545 -- -- -- (!) 59 14 94 % -- --    General: Alert, no distress Eyes: Sclera slightly icteric ENT:  There were no oropharyngeal lesions.   Neck was without thyromegaly.    Lymphatics:  Negative cervical, supraclavicular or axillary adenopathy.   Respiratory: Diminished at the bases, otherwise clear Cardiovascular:  Regular rate and rhythm, S1/S2, without murmur, rub or gallop.  Pitting edema throughout the bilateral lower extremities GI: Positive bowel sounds, edematous, nontender Musculoskeletal: Moves all extremities x4 Skin: A few ecchymotic areas on her bilateral arms noted Neuro exam was nonfocal. Patient was alert and oriented.  Attention was good.   Language was appropriate.  Mood was normal without depression.  Speech was not pressured.  Thought content was not tangential.     Lab Results  Component Value Date   WBC 7.5 03/09/2019   HGB 10.3 (L) 03/09/2019   HCT 30.1 (L) 03/09/2019   PLT 133 (L) 03/09/2019   GLUCOSE 201 (H) 03/09/2019   CHOL 109 09/23/2018   TRIG 101.0 09/23/2018   HDL 30.10 (L) 09/23/2018   LDLDIRECT 65.0 09/22/2015   LDLCALC 59 09/23/2018   ALT 16 03/08/2019   AST 24 03/08/2019   NA 127 (L) 03/09/2019   K 5.2 (H) 03/09/2019   CL 93 (L) 03/09/2019   CREATININE 5.09 (H) 03/09/2019   BUN 129 (H) 03/09/2019   CO2 18 (L) 03/09/2019    CT ABDOMEN PELVIS WO CONTRAST  Result Date: 03/08/2019 CLINICAL DATA:  Abdominal distension EXAM: CT ABDOMEN AND PELVIS WITHOUT CONTRAST TECHNIQUE: Multidetector CT imaging of the abdomen and pelvis was performed following the standard protocol without IV contrast. COMPARISON:  None. FINDINGS: LOWER CHEST: Cardiomegaly bibasilar atelectasis. HEPATOBILIARY: Normal hepatic contours. No intra- or extrahepatic biliary dilatation. There is cholelithiasis without acute inflammation. PANCREAS: Normal pancreas. No ductal dilatation or peripancreatic fluid collection. SPLEEN: Normal. ADRENALS/URINARY TRACT: The adrenal glands are normal. No hydronephrosis, nephroureterolithiasis or solid renal mass. The urinary bladder is normal for degree of distention STOMACH/BOWEL: There is no hiatal hernia. Normal  duodenal course and caliber. No small bowel dilatation or inflammation. No focal colonic abnormality. Not visualized. No right lower quadrant inflammation or free fluid. VASCULAR/LYMPHATIC: There is calcific atherosclerosis of the abdominal aorta. No abdominal or pelvic lymphadenopathy. REPRODUCTIVE: Normal uterus and ovaries. MUSCULOSKELETAL. No bony spinal canal stenosis or focal osseous abnormality. OTHER: Anasarca. IMPRESSION: 1. No acute abnormality of the abdomen or pelvis. 2. Cardiomegaly and calcific aortic atherosclerosis (ICD10-I70.0). 3. Diffuse anasarca. Electronically Signed   By: Ulyses Jarred M.D.   On: 03/08/2019 02:02   DG Pelvis 1-2 Views  Result Date: 02/10/2019 CLINICAL DATA:  Bilateral upper leg pain. EXAM: PELVIS - 1-2 VIEW COMPARISON:  None. FINDINGS: Hip joints and SI joints are symmetric and unremarkable. No acute bony abnormality. Specifically, no fracture, subluxation, or dislocation. Extensive calcified phleboliths in the pelvis. IMPRESSION: No acute bony abnormality. Electronically Signed   By: Rolm Baptise M.D.   On: 02/10/2019 13:44   CT FEMUR RIGHT WO CONTRAST  Result Date: 03/08/2019 CLINICAL DATA:  Upper leg trauma, right thigh swelling EXAM: CT OF THE LOWER RIGHT EXTREMITY WITHOUT CONTRAST TECHNIQUE: Multidetector CT imaging of the right lower extremity was performed according to the standard protocol. COMPARISON:  None. FINDINGS: Bones/Joint/Cartilage No fracture or dislocation. There is moderate right hip osteoarthritis with superior joint space loss and marginal osteophyte formation. No periosteal reaction or cortical destruction.  No large knee or hip joint effusion is seen. Ligaments Suboptimally assessed by CT. Muscles and Tendons The enthesopathy seen at the hamstrings insertion site. The remainder of the visualized portion of the tendons appear to be intact. The muscles surrounding the thigh appear to be intact. Soft tissues There is diffuse skin thickening and  subcutaneous edema seen surrounding the right lower extremity. Overlying the greater trochanter adjacent to the deep fascial layer there is thin linear area of fluid. The fluid does not appear to be encapsulated however. Dense vascular calcifications seen within the deep pelvis. IMPRESSION: 1. No acute osseous abnormality or soft tissue hematoma. 2. Thin non loculated fluid seen adjacent to the deep fascial layers/iliotibial tibial tract overlying the greater trochanter which could represent a posttraumatic seroma/Mild Sherry Ruffing lesion. 3. Diffuse subcutaneous edema and skin thickening surrounding the right lower extremity. Electronically Signed   By: Prudencio Pair M.D.   On: 03/08/2019 02:09   DG Chest Portable 1 View  Result Date: 03/07/2019 CLINICAL DATA:  Shortness of breath, weakness EXAM: PORTABLE CHEST 1 VIEW COMPARISON:  06/25/2017 FINDINGS: Left AICD remains in place, unchanged. Cardiomegaly. No confluent opacities or effusions. No edema. No acute bony abnormality. IMPRESSION: Cardiomegaly.  No acute cardiopulmonary disease. Electronically Signed   By: Rolm Baptise M.D.   On: 03/07/2019 21:04   DG Bone Survey Met  Result Date: 03/03/2019 CLINICAL DATA:  Plasma cell disorder. EXAM: METASTATIC BONE SURVEY COMPARISON:  February 10, 2019. FINDINGS: A lateral radiograph of the skull demonstrates no significant lytic lesions. There is no acute osseous abnormality. Evaluation of the right shoulder demonstrates no significant abnormality. There is incidentally noted a right-sided cervical rib. Evaluation of the left shoulder demonstrates no significant abnormality. A left-sided pacemaker is noted. Aortic calcifications are noted. Evaluation of the left humerus demonstrates no abnormality. Evaluation of the right humerus demonstrates no abnormality. Evaluation of the right forearm demonstrates no lytic lesion. Vascular calcifications are noted. Evaluation of the left forearm demonstrates vascular  calcifications without evidence for lytic lesion. Evaluation of the cervical spine demonstrates no acute abnormality. There is a right-sided cervical rib. Mild degenerative changes are noted of the cervical spine without evidence for an acute displaced fracture or prevertebral soft tissue swelling. Evaluation of the thoracic spine is limited by overlapping osseous structures and suboptimal patient positioning. There is no definite acute osseous abnormality however the midthoracic spine is poorly evaluated. Evaluation lumbar spine demonstrates no acute abnormality. There are extensive phleboliths projecting over the patient's pelvis. Mild degenerative changes are noted. There are aortic calcifications. The heart size is significantly enlarged without evidence for pleural effusion or pneumothorax. There is no focal infiltrate. Evaluation of the pelvis demonstrates no lytic lesion. There are mild degenerative changes of both hips. Evaluation of the right femur demonstrates no acute abnormality. There are no lytic lesions. Varicose veins are suspected. Evaluation of the left femur demonstrates no significant abnormality. Varicose veins are suspected. Evaluation of the right tibia and fibula demonstrates no lytic lesion or fracture. There is nonspecific soft tissue swelling about the ankle. Evaluation of the left tibia/fibula demonstrates no acute abnormality or lytic lesion. There is nonspecific soft tissue swelling of the lower extremity. IMPRESSION: 1. No significant lytic lesions are identified. 2. Mild degenerative changes of the cervical, thoracic, and lumbar spine. 3. Nonspecific soft tissue swelling of the lower extremities. 4. Aortic calcifications are noted. 5. Right-sided cervical rib. 6. Findings suspicious for bilateral lower extremity varicose veins. Electronically Signed   By: Harrell Gave  Green M.D.   On: 03/03/2019 16:33   CUP PACEART REMOTE DEVICE CHECK  Result Date: 02/22/2019 Scheduled remote  reviewed.  Normal device function.  Next remote 91 days.  US SPLEEN (ABDOMEN LIMITED)  Result Date: 02/10/2019 CLINICAL DATA:  Clinical concern for splenomegaly. EXAM: ULTRASOUND ABDOMEN LIMITED COMPARISON:  Abdomen and pelvis CT dated 03/03/2017. FINDINGS: Normal appearing spleen, measuring 12.7 cm in length with a calculated volume of 250 cc. Normal fatty tissue in the splenic hilum. 1.9 cm simple left renal cyst. Previously demonstrated left retroperitoneal varices. IMPRESSION: 1. Normal appearing spleen without splenomegaly. 2. Previously demonstrated left retroperitoneal varices. Electronically Signed   By: Claudie Revering M.D.   On: 02/10/2019 15:18   DG Femur Min 2 Views Left  Result Date: 02/10/2019 CLINICAL DATA:  Bilateral upper leg pain.  No known injury. EXAM: LEFT FEMUR 2 VIEWS COMPARISON:  None. FINDINGS: No acute bony abnormality. Specifically, no fracture, subluxation, or dislocation. Slight joint space narrowing in the medial compartment of the left knee. No joint effusion. Varicosities noted in the medial left thigh soft tissues. IMPRESSION: No acute bony abnormality. Electronically Signed   By: Rolm Baptise M.D.   On: 02/10/2019 13:43   DG Femur Min 2 Views Right  Result Date: 02/10/2019 CLINICAL DATA:  Bilateral upper leg pain. EXAM: RIGHT FEMUR 2 VIEWS COMPARISON:  None. FINDINGS: There is no evidence of fracture or other focal bone lesions. Soft tissues are unremarkable. IMPRESSION: Negative. Electronically Signed   By: Rolm Baptise M.D.   On: 02/10/2019 13:44   [place] EKG SITE RITE  Result Date: 03/08/2019 If Site Rite image not attached, placement could not be confirmed due to current cardiac rhythm.    CT ABDOMEN PELVIS WO CONTRAST  Result Date: 03/08/2019 CLINICAL DATA:  Abdominal distension EXAM: CT ABDOMEN AND PELVIS WITHOUT CONTRAST TECHNIQUE: Multidetector CT imaging of the abdomen and pelvis was performed following the standard protocol without IV contrast.  COMPARISON:  None. FINDINGS: LOWER CHEST: Cardiomegaly bibasilar atelectasis. HEPATOBILIARY: Normal hepatic contours. No intra- or extrahepatic biliary dilatation. There is cholelithiasis without acute inflammation. PANCREAS: Normal pancreas. No ductal dilatation or peripancreatic fluid collection. SPLEEN: Normal. ADRENALS/URINARY TRACT: The adrenal glands are normal. No hydronephrosis, nephroureterolithiasis or solid renal mass. The urinary bladder is normal for degree of distention STOMACH/BOWEL: There is no hiatal hernia. Normal duodenal course and caliber. No small bowel dilatation or inflammation. No focal colonic abnormality. Not visualized. No right lower quadrant inflammation or free fluid. VASCULAR/LYMPHATIC: There is calcific atherosclerosis of the abdominal aorta. No abdominal or pelvic lymphadenopathy. REPRODUCTIVE: Normal uterus and ovaries. MUSCULOSKELETAL. No bony spinal canal stenosis or focal osseous abnormality. OTHER: Anasarca. IMPRESSION: 1. No acute abnormality of the abdomen or pelvis. 2. Cardiomegaly and calcific aortic atherosclerosis (ICD10-I70.0). 3. Diffuse anasarca. Electronically Signed   By: Ulyses Jarred M.D.   On: 03/08/2019 02:02   DG Pelvis 1-2 Views  Result Date: 02/10/2019 CLINICAL DATA:  Bilateral upper leg pain. EXAM: PELVIS - 1-2 VIEW COMPARISON:  None. FINDINGS: Hip joints and SI joints are symmetric and unremarkable. No acute bony abnormality. Specifically, no fracture, subluxation, or dislocation. Extensive calcified phleboliths in the pelvis. IMPRESSION: No acute bony abnormality. Electronically Signed   By: Rolm Baptise M.D.   On: 02/10/2019 13:44   CT FEMUR RIGHT WO CONTRAST  Result Date: 03/08/2019 CLINICAL DATA:  Upper leg trauma, right thigh swelling EXAM: CT OF THE LOWER RIGHT EXTREMITY WITHOUT CONTRAST TECHNIQUE: Multidetector CT imaging of the right lower extremity was performed according  to the standard protocol. COMPARISON:  None. FINDINGS:  Bones/Joint/Cartilage No fracture or dislocation. There is moderate right hip osteoarthritis with superior joint space loss and marginal osteophyte formation. No periosteal reaction or cortical destruction. No large knee or hip joint effusion is seen. Ligaments Suboptimally assessed by CT. Muscles and Tendons The enthesopathy seen at the hamstrings insertion site. The remainder of the visualized portion of the tendons appear to be intact. The muscles surrounding the thigh appear to be intact. Soft tissues There is diffuse skin thickening and subcutaneous edema seen surrounding the right lower extremity. Overlying the greater trochanter adjacent to the deep fascial layer there is thin linear area of fluid. The fluid does not appear to be encapsulated however. Dense vascular calcifications seen within the deep pelvis. IMPRESSION: 1. No acute osseous abnormality or soft tissue hematoma. 2. Thin non loculated fluid seen adjacent to the deep fascial layers/iliotibial tibial tract overlying the greater trochanter which could represent a posttraumatic seroma/Mild Sherry Ruffing lesion. 3. Diffuse subcutaneous edema and skin thickening surrounding the right lower extremity. Electronically Signed   By: Prudencio Pair M.D.   On: 03/08/2019 02:09   DG Chest Portable 1 View  Result Date: 03/07/2019 CLINICAL DATA:  Shortness of breath, weakness EXAM: PORTABLE CHEST 1 VIEW COMPARISON:  06/25/2017 FINDINGS: Left AICD remains in place, unchanged. Cardiomegaly. No confluent opacities or effusions. No edema. No acute bony abnormality. IMPRESSION: Cardiomegaly.  No acute cardiopulmonary disease. Electronically Signed   By: Rolm Baptise M.D.   On: 03/07/2019 21:04   DG Bone Survey Met  Result Date: 03/03/2019 CLINICAL DATA:  Plasma cell disorder. EXAM: METASTATIC BONE SURVEY COMPARISON:  February 10, 2019. FINDINGS: A lateral radiograph of the skull demonstrates no significant lytic lesions. There is no acute osseous abnormality.  Evaluation of the right shoulder demonstrates no significant abnormality. There is incidentally noted a right-sided cervical rib. Evaluation of the left shoulder demonstrates no significant abnormality. A left-sided pacemaker is noted. Aortic calcifications are noted. Evaluation of the left humerus demonstrates no abnormality. Evaluation of the right humerus demonstrates no abnormality. Evaluation of the right forearm demonstrates no lytic lesion. Vascular calcifications are noted. Evaluation of the left forearm demonstrates vascular calcifications without evidence for lytic lesion. Evaluation of the cervical spine demonstrates no acute abnormality. There is a right-sided cervical rib. Mild degenerative changes are noted of the cervical spine without evidence for an acute displaced fracture or prevertebral soft tissue swelling. Evaluation of the thoracic spine is limited by overlapping osseous structures and suboptimal patient positioning. There is no definite acute osseous abnormality however the midthoracic spine is poorly evaluated. Evaluation lumbar spine demonstrates no acute abnormality. There are extensive phleboliths projecting over the patient's pelvis. Mild degenerative changes are noted. There are aortic calcifications. The heart size is significantly enlarged without evidence for pleural effusion or pneumothorax. There is no focal infiltrate. Evaluation of the pelvis demonstrates no lytic lesion. There are mild degenerative changes of both hips. Evaluation of the right femur demonstrates no acute abnormality. There are no lytic lesions. Varicose veins are suspected. Evaluation of the left femur demonstrates no significant abnormality. Varicose veins are suspected. Evaluation of the right tibia and fibula demonstrates no lytic lesion or fracture. There is nonspecific soft tissue swelling about the ankle. Evaluation of the left tibia/fibula demonstrates no acute abnormality or lytic lesion. There is  nonspecific soft tissue swelling of the lower extremity. IMPRESSION: 1. No significant lytic lesions are identified. 2. Mild degenerative changes of the cervical, thoracic, and lumbar  spine. 3. Nonspecific soft tissue swelling of the lower extremities. 4. Aortic calcifications are noted. 5. Right-sided cervical rib. 6. Findings suspicious for bilateral lower extremity varicose veins. Electronically Signed   By: Constance Holster M.D.   On: 03/03/2019 16:33   CUP PACEART REMOTE DEVICE CHECK  Result Date: 02/22/2019 Scheduled remote reviewed.  Normal device function.  Next remote 91 days.  US SPLEEN (ABDOMEN LIMITED)  Result Date: 02/10/2019 CLINICAL DATA:  Clinical concern for splenomegaly. EXAM: ULTRASOUND ABDOMEN LIMITED COMPARISON:  Abdomen and pelvis CT dated 03/03/2017. FINDINGS: Normal appearing spleen, measuring 12.7 cm in length with a calculated volume of 250 cc. Normal fatty tissue in the splenic hilum. 1.9 cm simple left renal cyst. Previously demonstrated left retroperitoneal varices. IMPRESSION: 1. Normal appearing spleen without splenomegaly. 2. Previously demonstrated left retroperitoneal varices. Electronically Signed   By: Claudie Revering M.D.   On: 02/10/2019 15:18   DG Femur Min 2 Views Left  Result Date: 02/10/2019 CLINICAL DATA:  Bilateral upper leg pain.  No known injury. EXAM: LEFT FEMUR 2 VIEWS COMPARISON:  None. FINDINGS: No acute bony abnormality. Specifically, no fracture, subluxation, or dislocation. Slight joint space narrowing in the medial compartment of the left knee. No joint effusion. Varicosities noted in the medial left thigh soft tissues. IMPRESSION: No acute bony abnormality. Electronically Signed   By: Rolm Baptise M.D.   On: 02/10/2019 13:43   DG Femur Min 2 Views Right  Result Date: 02/10/2019 CLINICAL DATA:  Bilateral upper leg pain. EXAM: RIGHT FEMUR 2 VIEWS COMPARISON:  None. FINDINGS: There is no evidence of fracture or other focal bone lesions. Soft  tissues are unremarkable. IMPRESSION: Negative. Electronically Signed   By: Rolm Baptise M.D.   On: 02/10/2019 13:44   [place] EKG SITE RITE  Result Date: 03/08/2019 If Site Rite image not attached, placement could not be confirmed due to current cardiac rhythm.  Assessment and Plan:  1.  Monoclonal gammopathy with elevated M spike and recent labs showing an elevated serum IgG, urine M spike, and elevated light chains 2.  Acute on chronic renal failure 3.  Anemia 4.  Thrombocytopenia 5.  Acute on chronic systolic heart failure exacerbation; LVEF less than 20% on echo from 03/09/2019 6.  Anasarca 7.  Chronic atrial fibrillation  -The patient was undergoing work-up at [facility] for monoclonal gammopathy.  Lab results from 03/03/2019 concerning for plasma cell disorder.  Will review with Dr. Lindi Adie, but may need to consider a bone marrow biopsy. -Renal function has worsened.  Nephrology is following closely.  May need to start dialysis in the near future.  Unsure if the patient would need a renal biopsy at some point.  Will defer to nephrology. -Patient has mild normocytic anemia.  Will monitor.  No transfusion is indicated. -The patient has mild thrombocytopenia which is stable.  We will continue to monitor. -Cardiology is following the patient closely for her CHF and atrial fibrillation.  Planning for right heart cath later today to assess LV morphology and see if there is any concern for amyloidosis.   Thank you for this referral.   Mikey Bussing, DNP, AGPCNP-BC, AOCNP   Attending Note  I personally saw the patient, reviewed the chart and examined the patient. The plan of care was discussed with the patient and her husband.  I agree with the assessment and plan as documented above. Thank you very much for the consultation.  1.  Monoclonal gammopathy: 02/03/2019: M spike 1.3 g, IgG kappa with elevated  beta-2 microglobulin of 9.6 and an elevated kappa of 1392 and a ratio 26.88 (03/03/2019) Bone  survey 03/03/2019: No lytic lesions No evidence of hypercalcemia. Repeat serum protein electrophoresis has been performed and results are pending. If the repeat electrophoresis shows marked increase in monoclonal protein then she will need a bone marrow biopsy. 2.  Worsening renal failure: Might benefit from a kidney biopsy to identify the cause of the renal failure. 3.  CHF and A. fib: Cardiology note suggests that there is no evidence of amyloidosis 4.  Abdominal wall thickening of the skin and subcutaneous tissues: Most likely related to anasarca.  CT of abdomen did not reveal any other findings.

## 2019-03-09 NOTE — Progress Notes (Signed)
Patient ID: [name], female   DOB: June 13, 1960, 59 y.o.   MRN: [id number]     Advanced Heart Failure Rounding Note  PCP-Cardiologist: Kate Sable, MD   Subjective:    Dobutamine 2.5 started empirically on 1/11.  She is getting high dose IV Lasix and had about 900 cc total UOP yesterday.   BUN/creatinine 120/4.97 => 129/5.09.    No complaints this morning, no dyspnea at rest.   Objective:   Weight Range: 90 kg Body mass index is 32.02 kg/m.   Vital Signs:   Temp:  [97.3 F (36.3 C)-97.8 F (36.6 C)] 97.8 F (36.6 C) (01/12 0700) Pulse Rate:  [58-68] 58 (01/12 0700) Resp:  [13-30] 15 (01/12 0700) BP: (95-112)/(62-76) 99/67 (01/12 0700) SpO2:  [91 %-98 %] 98 % (01/12 0700) Weight:  [90 kg-90.1 kg] 90 kg (01/12 0500) Last BM Date: 03/08/19  Weight change: Filed Weights   03/07/19 2008 03/08/19 1719 03/09/19 0500  Weight: 90.7 kg 90.1 kg 90 kg    Intake/Output:   Intake/Output Summary (Last 24 hours) at 03/09/2019 0804 Last data filed at 03/09/2019 0600 Gross per 24 hour  Intake 344.31 ml  Output 900 ml  Net -555.69 ml      Physical Exam    General:  Well appearing. No resp difficulty HEENT: Normal Neck: Supple. JVP 16+ cm. Carotids 2+ bilat; no bruits. No lymphadenopathy or thyromegaly appreciated. Cor: PMI lateral. Regular rate & rhythm. +S3.  2/6 HSM LLSB/apex. Lungs: Clear Abdomen: Soft, nontender, nondistended. No hepatosplenomegaly. No bruits or masses. Good bowel sounds. Extremities: No cyanosis, clubbing, rash. 1+ edema to knees.  Neuro: Alert & orientedx3, cranial nerves grossly intact. moves all 4 extremities w/o difficulty. Affect pleasant   Telemetry   Atrial fibrillation with BiV pacing, rate 60s (personally reviewed)  Labs    CBC Recent Labs    03/07/19 2101 03/08/19 0927 03/09/19 0228  WBC 6.8 7.2 7.5  NEUTROABS 5.5  --  6.2  HGB 10.9* 10.8* 10.3*  HCT 33.3* 32.8* 30.1*  MCV 84.9 83.7 80.9  PLT 139* 123* 133*   Basic  Metabolic Panel Recent Labs    03/08/19 0927 03/09/19 0228  NA 126* 127*  K 5.2* 5.2*  CL 93* 93*  CO2 17* 18*  GLUCOSE 49* 201*  BUN 120* 129*  CREATININE 4.97* 5.09*  CALCIUM 7.9* 7.9*   Liver Function Tests Recent Labs    03/07/19 2101 03/08/19 0927  AST 23 24  ALT 17 16  ALKPHOS 324* 303*  BILITOT 3.0* 3.3*  PROT 7.7 7.3  ALBUMIN 2.9* 2.7*   No results for input(s): LIPASE, AMYLASE in the last 72 hours. Cardiac Enzymes No results for input(s): CKTOTAL, CKMB, CKMBINDEX, TROPONINI in the last 72 hours.  BNP: BNP (last 3 results) Recent Labs    03/07/19 2101  BNP 1,072.0*    ProBNP (last 3 results) No results for input(s): PROBNP in the last 8760 hours.   D-Dimer No results for input(s): DDIMER in the last 72 hours. Hemoglobin A1C No results for input(s): HGBA1C in the last 72 hours. Fasting Lipid Panel No results for input(s): CHOL, HDL, LDLCALC, TRIG, CHOLHDL, LDLDIRECT in the last 72 hours. Thyroid Function Tests No results for input(s): TSH, T4TOTAL, T3FREE, THYROIDAB in the last 72 hours.  Invalid input(s): FREET3  Other results:   Imaging    [place] EKG SITE RITE  Result Date: 03/08/2019 If Site Rite image not attached, placement could not be confirmed due to current cardiac rhythm.  Medications:     Scheduled Medications: . aspirin  81 mg Oral Pre-Cath  . calcitRIOL  0.5 mcg Oral Daily  . insulin aspart  0-5 Units Subcutaneous QHS  . insulin aspart  0-9 Units Subcutaneous TID WC  . sodium chloride flush  3 mL Intravenous Q12H  . sodium chloride flush  3 mL Intravenous Q12H     Infusions: . sodium chloride Stopped (03/09/19 0034)  . sodium chloride    . sodium chloride 10 mL/hr at 03/09/19 0552  . DOBUTamine 2.5 mcg/kg/min (03/09/19 0400)  . furosemide Stopped (03/09/19 0017)     PRN Medications:  sodium chloride, sodium chloride, acetaminophen **OR** acetaminophen, ondansetron **OR** ondansetron (ZOFRAN) IV, sodium  chloride flush, sodium chloride flush    Assessment/Plan   1. Acute on chronic systolic CHF: Longstanding NICM.  Last echo in 2/19 with EF 15-20%. She has a [employer] CRT-D device. Admitted with progressive dyspnea and 30 lb weight gain. She is very volume overloaded on exam, complicated by SBP in 76O and AKI on CKD stage 4.  Poor response so far to IV Lasix, nephrology is following. Empirically started on dobutamine 2.5 due to concern for low output.  - Increase dobutamine to 4 mcg/kg/min.   - Lasix 160 mg IV every 8 hrs started by nephrology yesterday, will continue this dose.   - RHC today.  Discussed risks/benefits with patient and she agrees to procedure.  - Will get echo.  - Stopped digoxin and benazepril with AKI.  - Stopped beta blocker with concern for low output.  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  2. AKI on CKD stage 4: Baseline creatinine around 3, now up to 5 with BUN > 100.  Suspect cardiorenal syndrome superimposed on possible diabetic nephropathy.  She is unfortunately very volume overloaded as well.  So far, not responding well to high dose IV Lasix boluses.  - Nephrology following.  - Continue high dose IV Lasix + dobutamine to support CO.  If no response, may need CVVH (worried that she is progressing to this).  She would need placement of HD catheter.  3. Chronic atrial fibrillation: Warfarin held for procedures, INR 3.1.  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  5. DM: Per primary.  6. MGUS: As above, will assess LV morphology to see if looks concerning for AL amyloidosis.   Length of Stay: 1  Loralie Champagne, MD  03/09/2019, 8:04 AM  Advanced Heart Failure Team Pager [phone] (M-F; 7a - 4p)  Please contact [facility] for night-coverage after hours (4p -7a ) and weekends on [email or web address]

## 2019-03-09 NOTE — Progress Notes (Signed)
Subjective:  Events noted, moved to [facility]-  Started on dobutamine and on lasix 160 q 8.  900 of UOP recorded but 600 since midnight-  She does not appear uremic-  Is very alert, faithful    Objective Vital signs in last 24 hours: Vitals:   03/08/19 2343 03/09/19 0357 03/09/19 0500 03/09/19 0700  BP:  105/64  99/67  Pulse:  (!) 59  (!) 58  Resp:  18  15  Temp: 97.7 F (36.5 C) 97.7 F (36.5 C)  97.8 F (36.6 C)  TempSrc: Oral Oral  Oral  SpO2:  95%  98%  Weight:   90 kg   Height:       Weight change: -0.619 kg  Intake/Output Summary (Last 24 hours) at 03/09/2019 0819 Last data filed at 03/09/2019 0600 Gross per 24 hour  Intake 344.31 ml  Output 900 ml  Net -555.69 ml    Assessment/ Plan: Pt is a 59 y.o. yo female with nonischemic cardiomyopathy/MGUS/DM and baseline CKD (crt 3's)  who was admitted on 03/07/2019 with massive volume overload and crt 5  Assessment/Plan: 1. Renal-  A on CRF.  Had advanced CKD at baseline. Suspect cardiorenal vs DM.  Has M spike so that also raises the issue of a plasma cell related nephropathy- dont think she has been biopsied.  Now with some acute change vs progression in the setting of massive volume overload.  Definitely has the pathology of cardiorenal process.  Right now there are no absolute indications to initiate RRT BUT numbers are not good and she remains relatively hypotensive.  I told her that the odds are not in her favor for escaping dialysis.  She is faithful and will put her trust in God, is hoping that the medicine will work.  Right now attempting a trial of dobutamine and high dose lasix and go from there.  If she needed RRT I suspect would need to be CRRT initially to get her volume to a good place.  Will give it 24 hours - reassess tomorrow    2. Vol/htn-  Very volume overloaded and hypotensive-  Attempting dobutamine and lasix 3. Anemia- not a major issue at this point 4. Secondary hyperparathyroidism- is on calcitriol-  Will cont - will  check phos at some point  5. Hyperkalemia-  Has stayed in the low 5s-  Observation for now   [place]: Basic Metabolic Panel: Recent Labs  Lab 03/07/19 2101 03/08/19 0927 03/09/19 0228  NA 127* 126* 127*  K 5.3* 5.2* 5.2*  CL 94* 93* 93*  CO2 20* 17* 18*  GLUCOSE 77 49* 201*  BUN 118* 120* 129*  CREATININE 5.04* 4.97* 5.09*  CALCIUM 8.4* 7.9* 7.9*   Liver Function Tests: Recent Labs  Lab 03/07/19 2101 03/08/19 0927  AST 23 24  ALT 17 16  ALKPHOS 324* 303*  BILITOT 3.0* 3.3*  PROT 7.7 7.3  ALBUMIN 2.9* 2.7*   No results for input(s): LIPASE, AMYLASE in the last 168 hours. No results for input(s): AMMONIA in the last 168 hours. CBC: Recent Labs  Lab 03/07/19 2101 03/08/19 0927 03/09/19 0228  WBC 6.8 7.2 7.5  NEUTROABS 5.5  --  6.2  HGB 10.9* 10.8* 10.3*  HCT 33.3* 32.8* 30.1*  MCV 84.9 83.7 80.9  PLT 139* 123* 133*   Cardiac Enzymes: No results for input(s): CKTOTAL, CKMB, CKMBINDEX, TROPONINI in the last 168 hours. CBG: Recent Labs  Lab 03/08/19 1213 03/08/19 1809 03/08/19 2128  03/09/19 0547  GLUCAP 65* 223* 228* 150*    Iron Studies: No results for input(s): IRON, TIBC, TRANSFERRIN, FERRITIN in the last 72 hours. Studies/Results: CT ABDOMEN PELVIS WO CONTRAST  Result Date: 03/08/2019 CLINICAL DATA:  Abdominal distension EXAM: CT ABDOMEN AND PELVIS WITHOUT CONTRAST TECHNIQUE: Multidetector CT imaging of the abdomen and pelvis was performed following the standard protocol without IV contrast. COMPARISON:  None. FINDINGS: LOWER CHEST: Cardiomegaly bibasilar atelectasis. HEPATOBILIARY: Normal hepatic contours. No intra- or extrahepatic biliary dilatation. There is cholelithiasis without acute inflammation. PANCREAS: Normal pancreas. No ductal dilatation or peripancreatic fluid collection. SPLEEN: Normal. ADRENALS/URINARY TRACT: The adrenal glands are normal. No hydronephrosis, nephroureterolithiasis or solid renal mass. The urinary  bladder is normal for degree of distention STOMACH/BOWEL: There is no hiatal hernia. Normal duodenal course and caliber. No small bowel dilatation or inflammation. No focal colonic abnormality. Not visualized. No right lower quadrant inflammation or free fluid. VASCULAR/LYMPHATIC: There is calcific atherosclerosis of the abdominal aorta. No abdominal or pelvic lymphadenopathy. REPRODUCTIVE: Normal uterus and ovaries. MUSCULOSKELETAL. No bony spinal canal stenosis or focal osseous abnormality. OTHER: Anasarca. IMPRESSION: 1. No acute abnormality of the abdomen or pelvis. 2. Cardiomegaly and calcific aortic atherosclerosis (ICD10-I70.0). 3. Diffuse anasarca. Electronically Signed   By: Ulyses Jarred M.D.   On: 03/08/2019 02:02   CT FEMUR RIGHT WO CONTRAST  Result Date: 03/08/2019 CLINICAL DATA:  Upper leg trauma, right thigh swelling EXAM: CT OF THE LOWER RIGHT EXTREMITY WITHOUT CONTRAST TECHNIQUE: Multidetector CT imaging of the right lower extremity was performed according to the standard protocol. COMPARISON:  None. FINDINGS: Bones/Joint/Cartilage No fracture or dislocation. There is moderate right hip osteoarthritis with superior joint space loss and marginal osteophyte formation. No periosteal reaction or cortical destruction. No large knee or hip joint effusion is seen. Ligaments Suboptimally assessed by CT. Muscles and Tendons The enthesopathy seen at the hamstrings insertion site. The remainder of the visualized portion of the tendons appear to be intact. The muscles surrounding the thigh appear to be intact. Soft tissues There is diffuse skin thickening and subcutaneous edema seen surrounding the right lower extremity. Overlying the greater trochanter adjacent to the deep fascial layer there is thin linear area of fluid. The fluid does not appear to be encapsulated however. Dense vascular calcifications seen within the deep pelvis. IMPRESSION: 1. No acute osseous abnormality or soft tissue hematoma. 2.  Thin non loculated fluid seen adjacent to the deep fascial layers/iliotibial tibial tract overlying the greater trochanter which could represent a posttraumatic seroma/Mild Sherry Ruffing lesion. 3. Diffuse subcutaneous edema and skin thickening surrounding the right lower extremity. Electronically Signed   By: Prudencio Pair M.D.   On: 03/08/2019 02:09   DG Chest Portable 1 View  Result Date: 03/07/2019 CLINICAL DATA:  Shortness of breath, weakness EXAM: PORTABLE CHEST 1 VIEW COMPARISON:  06/25/2017 FINDINGS: Left AICD remains in place, unchanged. Cardiomegaly. No confluent opacities or effusions. No edema. No acute bony abnormality. IMPRESSION: Cardiomegaly.  No acute cardiopulmonary disease. Electronically Signed   By: Rolm Baptise M.D.   On: 03/07/2019 21:04   [place] EKG SITE RITE  Result Date: 03/08/2019 If Site Rite image not attached, placement could not be confirmed due to current cardiac rhythm.  Medications: Infusions: . sodium chloride Stopped (03/09/19 0034)  . sodium chloride    . sodium chloride 10 mL/hr at 03/09/19 0552  . DOBUTamine 2.5 mcg/kg/min (03/09/19 0400)  . furosemide Stopped (03/09/19 0017)    Scheduled Medications: . aspirin  81 mg Oral Pre-Cath  .  calcitRIOL  0.5 mcg Oral Daily  . insulin aspart  0-5 Units Subcutaneous QHS  . insulin aspart  0-9 Units Subcutaneous TID WC  . sodium chloride flush  3 mL Intravenous Q12H  . sodium chloride flush  3 mL Intravenous Q12H    have reviewed scheduled and prn medications.  Physical Exam: General: alert, pleasant-  NAD Heart: RRR Lungs: dec BS at bases Abdomen: soft, non tender Extremities: pitting edema throughout    03/09/2019,8:19 AM  LOS: 1 day

## 2019-03-09 NOTE — Progress Notes (Signed)
  Echocardiogram 2D Echocardiogram limited with definity has been performed.  [name] M 03/09/2019, 10:15 AM

## 2019-03-09 NOTE — Interval H&P Note (Signed)
History and Physical Interval Note:  03/09/2019 3:44 PM  [name]  has presented today for surgery, with the diagnosis of heart failure.  The various methods of treatment have been discussed with the patient and family. After consideration of risks, benefits and other options for treatment, the patient has consented to  Procedure(s): RIGHT HEART CATH (N/A) as a surgical intervention.  The patient's history has been reviewed, patient examined, no change in status, stable for surgery.  I have reviewed the patient's chart and labs.  Questions were answered to the patient's satisfaction.     Kitt Ledet [employer]

## 2019-03-09 NOTE — Progress Notes (Addendum)
PROGRESS NOTE    [name]  QJF:[id number] DOB: 11/15/1960 DOA: 03/07/2019 PCP: [name], MD   Brief Narrative: 59 year old with past medical history significant for MGUS, thrombocytopenia, A. fib on Coumadin, CHB with pacemaker, nonischemic cardiomyopathy with ejection fraction 15 to 20% with ICD, CKD stage IV baseline creatinine 3.0, cirrhosis who presented with several weeks of progressive swelling about 30 pound weight gain.  Evaluation in the ED patient was found to have a creatinine more than 5, anasarca, hemoglobin of 11.  Chest x-ray was clear but she required 2 L of supplemental oxygen.  Patient received 40 mg of IV Lasix without significant urine output.  Patient was transferred to [facility] for heart failure team evaluation, patient was a started on inotrope.    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 Started on dobutamine drip. Continue with IV Laxis 160 mg TID>  Urine Out put 900.  2-Acute on chronic renal failure stage IV: Prior creatinine 2.9--3.5 last year. Considering cardiorenal syndrome. Nephrology following for need of CRRT.   3-Anemia; MGUS with Thrombocytopenia;  Follow up with Dr Delton Coombes.  M spike 22/28. Kappa free light chain 1392. B 2 microglobulin 9.6.  Will consult Oncology.  Discussed with DR Lindi Adie, will order electrophoresis protein and kappa/lamda ratio. He will see patient in consultation.   4-Chronic A fib; continue with coumadin.  Holding coumadin in anticipation Cath.   5-Diabetes; SSI for now due to hypoglycemia.    6-Hyponatremia; related to hypervolemia.  Continue with diuresis.   7-Abdominal Firmness, CT negative for Hematoma.  Postraumatic seroma/mild Sherry Ruffing lesion, patient denies any recent trauma. Probably old injury.   8-Secondary Hyperparathyroidism;  Hyperkalemia; mild on lasix.   Cirrhosis;      Estimated body mass index is 32.02 kg/m as calculated from the following:   Height as of this encounter: 5\' 6"  (1.676 m).   Weight as of this encounter: 90 kg.   DVT prophylaxis: On Coumadin Code Status: Full code Family Communication: Care discussed with patient Disposition Plan:  Remain in the hospital for management of HF, renal failure.  Consultants:   Cardiology  Nephrology   Procedures:   ECHO    Antimicrobials:  None  Subjective: She report dyspnea has improved, now that she is on Oxygen, 5 L currently   Objective: Vitals:   03/09/19 0357 03/09/19 0500 03/09/19 0700 03/09/19 1142  BP: 105/64  99/67 (!) 90/56  Pulse: (!) 59  (!) 58   Resp: 18  15   Temp: 97.7 F (36.5 C)  97.8 F (36.6 C) 97.7 F (36.5 C)  TempSrc: Oral  Oral Oral  SpO2: 95%  98% 98%  Weight:  90 kg    Height:        Intake/Output Summary (Last 24 hours) at 03/09/2019 1253 Last data filed at 03/09/2019 0600 Gross per 24 hour  Intake 344.31 ml  Output 900 ml  Net -555.69 ml   Filed Weights   03/07/19 2008 03/08/19 1719 03/09/19 0500  Weight: 90.7 kg 90.1 kg 90 kg    Examination:  General exam:  Appears calm and comfortable  Respiratory system: Bilateral Crackles. Respiratory effort normal. Cardiovascular system: S1 & S2 heard, RRR. No JVD, murmurs, rubs, gallops or clicks. Plus 2 edema Gastrointestinal system: Abdomen is nondistended, soft and nontender. No organomegaly or masses felt. Normal bowel sounds heard. Central nervous system: Alert and oriented. No focal neurological deficits. Extremities: Symmetric 5 x 5 power. Skin: No rashes, lesions or ulcers    Data  Reviewed: I have personally reviewed following labs and imaging studies  CBC: Recent Labs  Lab 03/07/19 2101 03/08/19 0927 03/09/19 0228  WBC 6.8 7.2 7.5  NEUTROABS 5.5  --  6.2  HGB 10.9* 10.8* 10.3*  HCT 33.3* 32.8* 30.1*  MCV 84.9 83.7 80.9  PLT 139* 123* 465*   Basic Metabolic Panel: Recent Labs  Lab 03/07/19 2101 03/08/19 0927 03/09/19 0228  NA 127* 126* 127*  K 5.3* 5.2* 5.2*  CL 94* 93* 93*  CO2 20* 17* 18*  GLUCOSE 77 49* 201*  BUN 118* 120* 129*  CREATININE 5.04* 4.97* 5.09*  CALCIUM 8.4* 7.9* 7.9*   GFR: Estimated Creatinine Clearance: 13.5 mL/min (A) (by C-G formula based on SCr of 5.09 mg/dL (H)). Liver Function Tests: Recent Labs  Lab 03/07/19 2101 03/08/19 0927  AST 23 24  ALT 17 16  ALKPHOS 324* 303*  BILITOT 3.0* 3.3*  PROT 7.7 7.3  ALBUMIN 2.9* 2.7*   No results for input(s): LIPASE, AMYLASE in the last 168 hours. No results for input(s): AMMONIA in the last 168 hours. Coagulation Profile: Recent Labs  Lab 03/04/19 1151 03/07/19 2101 03/08/19 0927 03/09/19 0228  INR 5.1* 3.0* 3.1* 3.1*   Cardiac Enzymes: No results for input(s): CKTOTAL, CKMB, CKMBINDEX, TROPONINI in the last 168 hours. BNP (last 3 results) No results for input(s): PROBNP in the last 8760 hours. HbA1C: No results for input(s): HGBA1C in the last 72 hours. CBG: Recent Labs  Lab 03/08/19 1213 03/08/19 1809 03/08/19 2128 03/09/19 0547 03/09/19 1131  GLUCAP 65* 223* 228* 150* 102*   Lipid Profile: No results for input(s): CHOL, HDL, LDLCALC, TRIG, CHOLHDL, LDLDIRECT in the last 72 hours. Thyroid Function Tests: No results for input(s): TSH, T4TOTAL, FREET4, T3FREE, THYROIDAB in the last 72 hours. Anemia Panel: No results for input(s): VITAMINB12, FOLATE, FERRITIN, TIBC, IRON, RETICCTPCT in the last 72 hours. Sepsis Labs: No results for input(s): PROCALCITON, LATICACIDVEN in the last 168 hours.  Recent Results (from the past 240 hour(s))  Respiratory Panel  by RT PCR (Flu A&B, Covid) - Nasopharyngeal Swab     Status: None   Collection Time: 03/07/19  9:21 PM   Specimen: Nasopharyngeal Swab  Result Value Ref Range Status   SARS Coronavirus 2 by RT PCR NEGATIVE NEGATIVE Final    Comment: (NOTE) SARS-CoV-2 target nucleic acids are NOT DETECTED. The SARS-CoV-2 RNA is generally detectable in upper respiratoy specimens during the acute phase of infection. The lowest concentration of SARS-CoV-2 viral copies this assay can detect is 131 copies/mL. A negative result does not preclude SARS-Cov-2 infection and should not be used as the sole basis for treatment or other patient management decisions. A negative result may occur with  improper specimen collection/handling, submission of specimen other than nasopharyngeal swab, presence of viral mutation(s) within the areas targeted by this assay, and inadequate number of viral copies (<131 copies/mL). A negative result must be combined with clinical observations, patient history, and epidemiological information. The expected result is Negative. Fact Sheet for Patients:  PinkCheek.be Fact Sheet for Healthcare Providers:  GravelBags.it  This test is not yet ap proved or cleared by the [place] and  has been authorized for detection and/or diagnosis of SARS-CoV-2 by FDA under an Emergency Use Authorization (EUA). This EUA will remain  in effect (meaning this test can be used) for the duration of the COVID-19 declaration under Section 564(b)(1) of the Act, 21 U.S.C. section 360bbb-3(b)(1), unless the authorization is terminated or revoked sooner.    Influenza A by PCR NEGATIVE NEGATIVE Final   Influenza B by PCR NEGATIVE NEGATIVE Final    Comment: (NOTE) The Xpert Xpress SARS-CoV-2/FLU/RSV assay is intended as an aid in  the diagnosis of influenza from Nasopharyngeal swab specimens and  should not be used as a sole basis for treatment.  Nasal washings and  aspirates are unacceptable for Xpert Xpress SARS-CoV-2/FLU/RSV  testing. Fact Sheet for Patients: PinkCheek.be Fact Sheet for Healthcare Providers: GravelBags.it This test is not yet approved or cleared by the [place] FDA and  has been authorized for detection and/or diagnosis of SARS-CoV-2 by  FDA under an Emergency Use Authorization (EUA). This EUA will remain  in effect (meaning this test can be used) for the duration of the  Covid-19 declaration under Section 564(b)(1) of the Act, 21  U.S.C. section 360bbb-3(b)(1), unless the authorization is  terminated or revoked. Performed at [facility], [address] [place], [place] [address]   MRSA PCR Screening     Status: None   Collection Time: 03/08/19 10:26 PM   Specimen: Nasopharyngeal  Result Value Ref Range Status   MRSA by PCR NEGATIVE NEGATIVE Final    Comment:        The GeneXpert MRSA Assay (FDA approved for NASAL specimens only), is one component of a comprehensive MRSA colonization surveillance program. It is not intended to diagnose MRSA infection nor to guide or monitor treatment for MRSA infections. Performed at [facility], [place] [address], [place], [place] [address]          Radiology Studies: CT ABDOMEN PELVIS WO CONTRAST  Result Date: 03/08/2019 CLINICAL DATA:  Abdominal distension EXAM: CT ABDOMEN AND PELVIS WITHOUT CONTRAST TECHNIQUE: Multidetector CT imaging of the abdomen and pelvis was performed following the standard protocol without IV contrast. COMPARISON:  None. FINDINGS: LOWER CHEST: Cardiomegaly bibasilar atelectasis. HEPATOBILIARY: Normal hepatic contours. No intra- or extrahepatic biliary dilatation. There is cholelithiasis without acute inflammation. PANCREAS: Normal pancreas. No ductal dilatation or peripancreatic fluid collection. SPLEEN: Normal. ADRENALS/URINARY TRACT: The adrenal glands are normal. No  hydronephrosis, nephroureterolithiasis or solid renal mass. The urinary bladder is normal for degree of distention STOMACH/BOWEL: There is no hiatal hernia. Normal duodenal course and caliber. No small bowel dilatation or inflammation. No focal colonic abnormality. Not visualized. No right lower quadrant inflammation or free fluid. VASCULAR/LYMPHATIC: There is calcific atherosclerosis of the abdominal aorta. No abdominal or pelvic lymphadenopathy. REPRODUCTIVE: Normal uterus and ovaries. MUSCULOSKELETAL. No bony spinal canal stenosis or focal osseous abnormality. OTHER: Anasarca. IMPRESSION: 1. No acute abnormality of the abdomen or pelvis. 2. Cardiomegaly and calcific aortic atherosclerosis (ICD10-I70.0). 3. Diffuse anasarca. Electronically Signed   By: Ulyses Jarred M.D.   On: 03/08/2019 02:02   CT FEMUR RIGHT WO CONTRAST  Result Date: 03/08/2019 CLINICAL DATA:  Upper leg trauma, right thigh swelling EXAM: CT OF THE LOWER RIGHT EXTREMITY WITHOUT CONTRAST TECHNIQUE: Multidetector CT imaging of the right lower extremity was performed according to the standard protocol. COMPARISON:  None. FINDINGS: Bones/Joint/Cartilage No fracture or dislocation. There is moderate right hip osteoarthritis  with superior joint space loss and marginal osteophyte formation. No periosteal reaction or cortical destruction. No large knee or hip joint effusion is seen. Ligaments Suboptimally assessed by CT. Muscles and Tendons The enthesopathy seen at the hamstrings insertion site. The remainder of the visualized portion of the tendons appear to be intact. The muscles surrounding the thigh appear to be intact. Soft tissues There is diffuse skin thickening and subcutaneous edema seen surrounding the right lower extremity. Overlying the greater trochanter adjacent to the deep fascial layer there is thin linear area of fluid. The fluid does not appear to be encapsulated however. Dense vascular calcifications seen within the deep pelvis.  IMPRESSION: 1. No acute osseous abnormality or soft tissue hematoma. 2. Thin non loculated fluid seen adjacent to the deep fascial layers/iliotibial tibial tract overlying the greater trochanter which could represent a posttraumatic seroma/Mild Sherry Ruffing lesion. 3. Diffuse subcutaneous edema and skin thickening surrounding the right lower extremity. Electronically Signed   By: Prudencio Pair M.D.   On: 03/08/2019 02:09   DG Chest Portable 1 View  Result Date: 03/07/2019 CLINICAL DATA:  Shortness of breath, weakness EXAM: PORTABLE CHEST 1 VIEW COMPARISON:  06/25/2017 FINDINGS: Left AICD remains in place, unchanged. Cardiomegaly. No confluent opacities or effusions. No edema. No acute bony abnormality. IMPRESSION: Cardiomegaly.  No acute cardiopulmonary disease. Electronically Signed   By: Rolm Baptise M.D.   On: 03/07/2019 21:04   [place] EKG SITE RITE  Result Date: 03/08/2019 If Site Rite image not attached, placement could not be confirmed due to current cardiac rhythm.       Scheduled Meds: . calcitRIOL  0.5 mcg Oral Daily  . insulin aspart  0-5 Units Subcutaneous QHS  . insulin aspart  0-9 Units Subcutaneous TID WC  . sodium chloride flush  3 mL Intravenous Q12H  . sodium chloride flush  3 mL Intravenous Q12H   Continuous Infusions: . sodium chloride Stopped (03/09/19 0034)  . sodium chloride    . sodium chloride 10 mL/hr at 03/09/19 0552  . DOBUTamine 4 mcg/kg/min (03/09/19 0839)  . furosemide 160 mg (03/09/19 1041)     LOS: 1 day    Time spent: 35 minutes.     Elmarie Shiley, MD [facility]   If 7PM-7AM, please contact night-coverage [email or web address] Password [facility] 03/09/2019, 12:53 PM

## 2019-03-09 NOTE — Progress Notes (Signed)
Chaplain engaged in initial visit with [name].  Chaplain explained Advanced Directive paperwork and will provide education when husband comes in later.  [name] shared her faith with chaplain, as well as her current thoughts around dialysis.  [name] was also able to share her passion of writing with chaplain.  Chaplain served as a compassionate presence and listener.   Chaplain will follow-up.

## 2019-03-09 NOTE — Progress Notes (Signed)
ANTICOAGULATION CONSULT NOTE - [place] for Coumadin Indication: atrial fibrillation  Allergies  Allergen Reactions  . Wheat Swelling  . Latex Rash  . Penicillins Rash  . Sulfa Antibiotics Rash    Patient Measurements: Height: 5\' 6"  (167.6 cm) Weight: 198 lb 6.6 oz (90 kg) IBW/kg (Calculated) : 59.3  Vital Signs: Temp: 97.8 F (36.6 C) (01/12 0700) Temp Source: Oral (01/12 0700) BP: 99/67 (01/12 0700) Pulse Rate: 58 (01/12 0700)  Labs: Recent Labs    03/07/19 2101 03/07/19 2306 03/08/19 0927 03/09/19 0228  HGB 10.9*  --  10.8* 10.3*  HCT 33.3*  --  32.8* 30.1*  PLT 139*  --  123* 133*  LABPROT 31.3*  --  31.7* 32.1*  INR 3.0*  --  3.1* 3.1*  CREATININE 5.04*  --  4.97* 5.09*  TROPONINIHS 28* 27*  --   --     Estimated Creatinine Clearance: 13.5 mL/min (A) (by C-G formula based on SCr of 5.09 mg/dL (H)).   Medical History: Past Medical History:  Diagnosis Date  . Automatic implantable cardioverter-defibrillator in situ    a. 03/2013: BSX Energen CRTD BiV ICC, ser# [id number]  . Chronic anticoagulation    a. coumadin.  . Chronic atrial fibrillation (HCC)    embolic rind  . Chronic kidney disease    creatinine- 1.8 in 09/2006 and 2.0 in 05/2007; 2.05 in 2011, 2.29 in 2012  . Chronic systolic CHF (congestive heart failure) ([place])    a. 03/2013 Echo: Sev LV dysfxn with sev diff HK, restrictive phys, diast dysfxn, mild MR, sev dil LA/RA, mod PR, PASP 28mmHg.  . Congenital third degree heart block    a. Guidant VVI pacemaker implanted in 09/1997; b. gen change in 01/2004;  c. 03/2013 upgrade to [place], ser# [id number].  . Dizziness and giddiness 05/19/2012  . Dysphagia 08/14/2011   FEB 2013 EGD/DIL 16 MM; GERD; h/o gastroesophageal reflux disease; + H. Pylori gastritis   . GERD (gastroesophageal reflux disease)   . Helicobacter pylori gastritis 08/14/2011  . IDDM (insulin dependent diabetes mellitus)   . Kidney stones 1990's  .  Nonischemic cardiomyopathy ([place])    a. 03/2013 Echo: Sev LV dysfxn with sev diff HK, restrictive phys, diast dysfxn, mild MR, sev dil LA/RA, mod PR, PASP 47mmHg;  b. 03/2013: BSX Energen CRTD BiV ICC, ser# [id number].  Marland Kitchen Potassium (K) excess   . Stroke [facility]) 1999   denies residual on 04/21/2013     Assessment: 69 yoF on warfarin PTA for hx AFib admitted with acute CHF. INR 3.1, H/H stable.   Goal of Therapy:  INR 2-3 Monitor platelets by anticoagulation protocol: Yes   Plan:  -Hold warfarin - may need procedures -Follow protime   Arrie Senate, PharmD, BCPS Clinical Pharmacist [phone] Please check AMION for all [facility] numbers 03/09/2019

## 2019-03-09 NOTE — H&P (View-Only) (Signed)
Patient ID: [name], female   DOB: 1960-07-18, 59 y.o.   MRN: [id number]     Advanced Heart Failure Rounding Note  PCP-Cardiologist: Kate Sable, MD   Subjective:    Dobutamine 2.5 started empirically on 1/11.  She is getting high dose IV Lasix and had about 900 cc total UOP yesterday.   BUN/creatinine 120/4.97 => 129/5.09.    No complaints this morning, no dyspnea at rest.   Objective:   Weight Range: 90 kg Body mass index is 32.02 kg/m.   Vital Signs:   Temp:  [97.3 F (36.3 C)-97.8 F (36.6 C)] 97.8 F (36.6 C) (01/12 0700) Pulse Rate:  [58-68] 58 (01/12 0700) Resp:  [13-30] 15 (01/12 0700) BP: (95-112)/(62-76) 99/67 (01/12 0700) SpO2:  [91 %-98 %] 98 % (01/12 0700) Weight:  [90 kg-90.1 kg] 90 kg (01/12 0500) Last BM Date: 03/08/19  Weight change: Filed Weights   03/07/19 2008 03/08/19 1719 03/09/19 0500  Weight: 90.7 kg 90.1 kg 90 kg    Intake/Output:   Intake/Output Summary (Last 24 hours) at 03/09/2019 0804 Last data filed at 03/09/2019 0600 Gross per 24 hour  Intake 344.31 ml  Output 900 ml  Net -555.69 ml      Physical Exam    General:  Well appearing. No resp difficulty HEENT: Normal Neck: Supple. JVP 16+ cm. Carotids 2+ bilat; no bruits. No lymphadenopathy or thyromegaly appreciated. Cor: PMI lateral. Regular rate & rhythm. +S3.  2/6 HSM LLSB/apex. Lungs: Clear Abdomen: Soft, nontender, nondistended. No hepatosplenomegaly. No bruits or masses. Good bowel sounds. Extremities: No cyanosis, clubbing, rash. 1+ edema to knees.  Neuro: Alert & orientedx3, cranial nerves grossly intact. moves all 4 extremities w/o difficulty. Affect pleasant   Telemetry   Atrial fibrillation with BiV pacing, rate 60s (personally reviewed)  Labs    CBC Recent Labs    03/07/19 2101 03/08/19 0927 03/09/19 0228  WBC 6.8 7.2 7.5  NEUTROABS 5.5  --  6.2  HGB 10.9* 10.8* 10.3*  HCT 33.3* 32.8* 30.1*  MCV 84.9 83.7 80.9  PLT 139* 123* 133*   Basic  Metabolic Panel Recent Labs    03/08/19 0927 03/09/19 0228  NA 126* 127*  K 5.2* 5.2*  CL 93* 93*  CO2 17* 18*  GLUCOSE 49* 201*  BUN 120* 129*  CREATININE 4.97* 5.09*  CALCIUM 7.9* 7.9*   Liver Function Tests Recent Labs    03/07/19 2101 03/08/19 0927  AST 23 24  ALT 17 16  ALKPHOS 324* 303*  BILITOT 3.0* 3.3*  PROT 7.7 7.3  ALBUMIN 2.9* 2.7*   No results for input(s): LIPASE, AMYLASE in the last 72 hours. Cardiac Enzymes No results for input(s): CKTOTAL, CKMB, CKMBINDEX, TROPONINI in the last 72 hours.  BNP: BNP (last 3 results) Recent Labs    03/07/19 2101  BNP 1,072.0*    ProBNP (last 3 results) No results for input(s): PROBNP in the last 8760 hours.   D-Dimer No results for input(s): DDIMER in the last 72 hours. Hemoglobin A1C No results for input(s): HGBA1C in the last 72 hours. Fasting Lipid Panel No results for input(s): CHOL, HDL, LDLCALC, TRIG, CHOLHDL, LDLDIRECT in the last 72 hours. Thyroid Function Tests No results for input(s): TSH, T4TOTAL, T3FREE, THYROIDAB in the last 72 hours.  Invalid input(s): FREET3  Other results:   Imaging    [place] EKG SITE RITE  Result Date: 03/08/2019 If Site Rite image not attached, placement could not be confirmed due to current cardiac rhythm.  Medications:     Scheduled Medications: . aspirin  81 mg Oral Pre-Cath  . calcitRIOL  0.5 mcg Oral Daily  . insulin aspart  0-5 Units Subcutaneous QHS  . insulin aspart  0-9 Units Subcutaneous TID WC  . sodium chloride flush  3 mL Intravenous Q12H  . sodium chloride flush  3 mL Intravenous Q12H     Infusions: . sodium chloride Stopped (03/09/19 0034)  . sodium chloride    . sodium chloride 10 mL/hr at 03/09/19 0552  . DOBUTamine 2.5 mcg/kg/min (03/09/19 0400)  . furosemide Stopped (03/09/19 0017)     PRN Medications:  sodium chloride, sodium chloride, acetaminophen **OR** acetaminophen, ondansetron **OR** ondansetron (ZOFRAN) IV, sodium  chloride flush, sodium chloride flush    Assessment/Plan   1. Acute on chronic systolic CHF: Longstanding NICM.  Last echo in 2/19 with EF 15-20%. She has a Chemical engineer CRT-D device. Admitted with progressive dyspnea and 30 lb weight gain. She is very volume overloaded on exam, complicated by SBP in 48A and AKI on CKD stage 4.  Poor response so far to IV Lasix, nephrology is following. Empirically started on dobutamine 2.5 due to concern for low output.  - Increase dobutamine to 4 mcg/kg/min.   - Lasix 160 mg IV every 8 hrs started by nephrology yesterday, will continue this dose.   - RHC today.  Discussed risks/benefits with patient and she agrees to procedure.  - Will get echo.  - Stopped digoxin and benazepril with AKI.  - Stopped beta blocker with concern for low output.  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  2. AKI on CKD stage 4: Baseline creatinine around 3, now up to 5 with BUN > 100.  Suspect cardiorenal syndrome superimposed on possible diabetic nephropathy.  She is unfortunately very volume overloaded as well.  So far, not responding well to high dose IV Lasix boluses.  - Nephrology following.  - Continue high dose IV Lasix + dobutamine to support CO.  If no response, may need CVVH (worried that she is progressing to this).  She would need placement of HD catheter.  3. Chronic atrial fibrillation: Warfarin held for procedures, INR 3.1.  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  5. DM: Per primary.  6. MGUS: As above, will assess LV morphology to see if looks concerning for AL amyloidosis.   Length of Stay: 1  Loralie Champagne, MD  03/09/2019, 8:04 AM  Advanced Heart Failure Team Pager [phone] (M-F; 7a - 4p)  Please contact [facility] for night-coverage after hours (4p -7a ) and weekends on [email or web address]

## 2019-03-09 NOTE — Plan of Care (Signed)
  Problem: Education: Goal: Ability to demonstrate management of disease process will improve Outcome: Progressing Goal: Ability to verbalize understanding of medication therapies will improve Outcome: Progressing Goal: Individualized Educational Video(s) Outcome: Progressing   Problem: Activity: Goal: Capacity to carry out activities will improve Outcome: Progressing   Problem: Cardiac: Goal: Ability to achieve and maintain adequate cardiopulmonary perfusion will improve Outcome: Progressing   Problem: Education: Goal: Knowledge of General Education information will improve Description: Including pain rating scale, medication(s)/side effects and non-pharmacologic comfort measures Outcome: Progressing   Problem: Health Behavior/Discharge Planning: Goal: Ability to manage health-related needs will improve Outcome: Progressing   Problem: Clinical Measurements: Goal: Ability to maintain clinical measurements within normal limits will improve Outcome: Not Progressing Goal: Will remain free from infection Outcome: Progressing Goal: Diagnostic test results will improve Outcome: Not Progressing Goal: Respiratory complications will improve Outcome: Progressing Goal: Cardiovascular complication will be avoided Outcome: Progressing   Problem: Activity: Goal: Risk for activity intolerance will decrease Outcome: Progressing   Problem: Nutrition: Goal: Adequate nutrition will be maintained Outcome: Progressing   Problem: Coping: Goal: Level of anxiety will decrease Outcome: Progressing   Problem: Elimination: Goal: Will not experience complications related to bowel motility Outcome: Progressing Goal: Will not experience complications related to urinary retention Outcome: Progressing   Problem: Pain Managment: Goal: General experience of comfort will improve Outcome: Progressing   Problem: Safety: Goal: Ability to remain free from injury will improve Outcome:  Progressing   Problem: Skin Integrity: Goal: Risk for impaired skin integrity will decrease Outcome: Progressing   Problem: Education: Goal: Understanding of CV disease, CV risk reduction, and recovery process will improve Outcome: Progressing Goal: Individualized Educational Video(s) Outcome: Progressing   Problem: Activity: Goal: Ability to return to baseline activity level will improve Outcome: Progressing   Problem: Cardiovascular: Goal: Ability to achieve and maintain adequate cardiovascular perfusion will improve Outcome: Progressing Goal: Vascular access site(s) Level 0-1 will be maintained Outcome: Progressing   Problem: Health Behavior/Discharge Planning: Goal: Ability to safely manage health-related needs after discharge will improve Outcome: Progressing

## 2019-03-10 LAB — CBC WITH DIFFERENTIAL/PLATELET
Abs Immature Granulocytes: 0.02 10*3/uL (ref 0.00–0.07)
Basophils Absolute: 0 10*3/uL (ref 0.0–0.1)
Basophils Relative: 0 %
Eosinophils Absolute: 0.2 10*3/uL (ref 0.0–0.5)
Eosinophils Relative: 3 %
HCT: 29.1 % — ABNORMAL LOW (ref 36.0–46.0)
Hemoglobin: 9.8 g/dL — ABNORMAL LOW (ref 12.0–15.0)
Immature Granulocytes: 0 %
Lymphocytes Relative: 11 %
Lymphs Abs: 0.7 10*3/uL (ref 0.7–4.0)
MCH: 27.2 pg (ref 26.0–34.0)
MCHC: 33.7 g/dL (ref 30.0–36.0)
MCV: 80.8 fL (ref 80.0–100.0)
Monocytes Absolute: 0.4 10*3/uL (ref 0.1–1.0)
Monocytes Relative: 6 %
Neutro Abs: 5 10*3/uL (ref 1.7–7.7)
Neutrophils Relative %: 80 %
Platelets: 127 10*3/uL — ABNORMAL LOW (ref 150–400)
RBC: 3.6 MIL/uL — ABNORMAL LOW (ref 3.87–5.11)
RDW: 16.4 % — ABNORMAL HIGH (ref 11.5–15.5)
WBC: 6.3 10*3/uL (ref 4.0–10.5)
nRBC: 0.6 % — ABNORMAL HIGH (ref 0.0–0.2)

## 2019-03-10 LAB — RENAL FUNCTION PANEL
Albumin: 2.2 g/dL — ABNORMAL LOW (ref 3.5–5.0)
Anion gap: 15 (ref 5–15)
BUN: 142 mg/dL — ABNORMAL HIGH (ref 6–20)
CO2: 17 mmol/L — ABNORMAL LOW (ref 22–32)
Calcium: 6.9 mg/dL — ABNORMAL LOW (ref 8.9–10.3)
Chloride: 95 mmol/L — ABNORMAL LOW (ref 98–111)
Creatinine, Ser: 5.11 mg/dL — ABNORMAL HIGH (ref 0.44–1.00)
GFR calc Af Amer: 10 mL/min — ABNORMAL LOW (ref >60)
GFR calc non Af Amer: 9 mL/min — ABNORMAL LOW (ref >60)
Glucose, Bld: 169 mg/dL — ABNORMAL HIGH (ref 70–99)
Phosphorus: 7.4 mg/dL — ABNORMAL HIGH (ref 2.5–4.6)
Potassium: 4.6 mmol/L (ref 3.5–5.1)
Sodium: 127 mmol/L — ABNORMAL LOW (ref 135–145)

## 2019-03-10 LAB — KAPPA/LAMBDA LIGHT CHAINS
Kappa free light chain: 2004.2 mg/L — ABNORMAL HIGH (ref 3.3–19.4)
Kappa, lambda light chain ratio: 43.01 — ABNORMAL HIGH (ref 0.26–1.65)
Lambda free light chains: 46.6 mg/L — ABNORMAL HIGH (ref 5.7–26.3)

## 2019-03-10 LAB — PROTEIN ELECTROPHORESIS, SERUM
A/G Ratio: 0.7 (ref 0.7–1.7)
Albumin ELP: 2.6 g/dL — ABNORMAL LOW (ref 2.9–4.4)
Alpha-1-Globulin: 0.4 g/dL (ref 0.0–0.4)
Alpha-2-Globulin: 0.6 g/dL (ref 0.4–1.0)
Beta Globulin: 0.8 g/dL (ref 0.7–1.3)
Gamma Globulin: 1.8 g/dL (ref 0.4–1.8)
Globulin, Total: 3.6 g/dL (ref 2.2–3.9)
M-Spike, %: 1.2 g/dL — ABNORMAL HIGH
Total Protein ELP: 6.2 g/dL (ref 6.0–8.5)

## 2019-03-10 LAB — IRON AND TIBC
Iron: 29 ug/dL (ref 28–170)
Saturation Ratios: 13 % (ref 10.4–31.8)
TIBC: 217 ug/dL — ABNORMAL LOW (ref 250–450)
UIBC: 188 ug/dL

## 2019-03-10 LAB — PROTIME-INR
INR: 2.8 — ABNORMAL HIGH (ref 0.8–1.2)
Prothrombin Time: 29.5 seconds — ABNORMAL HIGH (ref 11.4–15.2)

## 2019-03-10 LAB — GLUCOSE, CAPILLARY
Glucose-Capillary: 162 mg/dL — ABNORMAL HIGH (ref 70–99)
Glucose-Capillary: 161 mg/dL — ABNORMAL HIGH (ref 70–99)
Glucose-Capillary: 165 mg/dL — ABNORMAL HIGH (ref 70–99)
Glucose-Capillary: 145 mg/dL — ABNORMAL HIGH (ref 70–99)

## 2019-03-10 LAB — FERRITIN: Ferritin: 253 ng/mL (ref 11–307)

## 2019-03-10 MED ORDER — VITAMIN B-12 100 MCG PO TABS
100.0000 ug | ORAL_TABLET | Freq: Every day | ORAL | Status: DC
  Administered 2019-03-10 – 2019-03-29 (×19): 100 ug via ORAL
  Filled 2019-03-10 (×21): qty 1

## 2019-03-10 NOTE — Progress Notes (Signed)
Chaplain engaged in follow-up visit with Mrs. Brassell.    Chaplain will follow-up

## 2019-03-10 NOTE — Progress Notes (Signed)
Subjective:  Remains on dobutamine and on lasix 160 q 8.  1750 UOP, was an improvement.   She does not appear uremic-  Is very alert--- right heart cath wedge 19-  rv pressure 43/18   Objective Vital signs in last 24 hours: Vitals:   03/09/19 2350 03/10/19 0248 03/10/19 0800 03/10/19 0802  BP: 101/60 (!) 101/59 (!) 91/55 (!) 93/49  Pulse:  60 60   Resp: (!) 22 12 18    Temp: 97.9 F (36.6 C) 97.9 F (36.6 C) 97.8 F (36.6 C)   TempSrc: Oral Oral Oral   SpO2: 94% 96% 96%   Weight:  88.8 kg    Height:       Weight change: -1.3 kg  Intake/Output Summary (Last 24 hours) at 03/10/2019 0816 Last data filed at 03/10/2019 0800 Gross per 24 hour  Intake 522.67 ml  Output 1750 ml  Net -1227.33 ml    Assessment/ Plan: Pt is a 59 y.o. yo female with nonischemic cardiomyopathy/MGUS/DM and baseline CKD (crt 3's)  who was admitted on 03/07/2019 with massive volume overload and crt 5  Assessment/Plan: 1. Renal-  A on CRF.  Had advanced CKD at baseline. Suspect cardiorenal vs DM.  Has M spike so that also raises the issue of a plasma cell related nephropathy- dont think she has been biopsied.  Per cards-  Heart does not look like amyloid.  Onc has seen, possibly considering bone marrow biopsy.   Now with some acute change vs progression in the setting of massive volume overload.  Definitely has the pathology of cardiorenal process.  Right now there are no absolute indications to initiate RRT BUT numbers are not good and she remains relatively hypotensive.  I told her that the odds are not in her favor for escaping dialysis.  She is faithful and will put her trust in God, is hoping that the medicine will work-  Stays to that end "im not claiming that" .  Right now attempting a trial of dobutamine and high dose lasix and go from there.  If she needed RRT I suspect would need to be CRRT initially to get her volume to a good place.   - reassess daily-  Labs not back yet  - she is not clinically uremic and  making urine so does need RRT today, will follow up on labs from this AM 2. Vol/htn-  Very volume overloaded and hypotensive-  Attempting dobutamine and lasix-  Able to achieve diuresis so far 3. Anemia- not a major issue at this point which argues against myeloma as does low calcium 4. Secondary hyperparathyroidism- is on calcitriol-  Will cont - will check phos at some point  5. Hyperkalemia-  Improved with lasix-   6. HYponatremia-  Likely hypervolemic hyponatremia-  Improving slowly   Louis Meckel    Labs: Basic Metabolic Panel: Recent Labs  Lab 03/07/19 2101 03/08/19 0927 03/09/19 0228 03/09/19 1558 03/09/19 1559  NA 127* 126* 127* 129* 128*  K 5.3* 5.2* 5.2* 4.5 4.6  CL 94* 93* 93*  --   --   CO2 20* 17* 18*  --   --   GLUCOSE 77 49* 201*  --   --   BUN 118* 120* 129*  --   --   CREATININE 5.04* 4.97* 5.09*  --   --   CALCIUM 8.4* 7.9* 7.9*  --   --    Liver Function Tests: Recent Labs  Lab 03/07/19 2101 03/08/19 0927  AST 23  24  ALT 17 16  ALKPHOS 324* 303*  BILITOT 3.0* 3.3*  PROT 7.7 7.3  ALBUMIN 2.9* 2.7*   No results for input(s): LIPASE, AMYLASE in the last 168 hours. No results for input(s): AMMONIA in the last 168 hours. CBC: Recent Labs  Lab 03/07/19 2101 03/08/19 0927 03/09/19 0228 03/09/19 1558 03/09/19 1559  WBC 6.8 7.2 7.5  --   --   NEUTROABS 5.5  --  6.2  --   --   HGB 10.9* 10.8* 10.3* 11.6* 11.2*  HCT 33.3* 32.8* 30.1* 34.0* 33.0*  MCV 84.9 83.7 80.9  --   --   PLT 139* 123* 133*  --   --    Cardiac Enzymes: No results for input(s): CKTOTAL, CKMB, CKMBINDEX, TROPONINI in the last 168 hours. CBG: Recent Labs  Lab 03/09/19 0547 03/09/19 1131 03/09/19 1654 03/09/19 2133 03/10/19 0622  GLUCAP 150* 102* 83 189* 162*    Iron Studies: No results for input(s): IRON, TIBC, TRANSFERRIN, FERRITIN in the last 72 hours. Studies/Results: CARDIAC CATHETERIZATION  Result Date: 03/09/2019 1. Right > left heart failure with low  PAPI. 2. Pulmonary venous hypertension. 3. Adequate cardiac output on dobutamine 2.5 mcg/kg/min  ECHOCARDIOGRAM COMPLETE  Result Date: 03/09/2019   ECHOCARDIOGRAM REPORT   Patient Name:   [name] Date of Exam: 03/09/2019 Medical Rec #:  [id number]       Height:       66.0 in Accession #:    [id number]      Weight:       198.4 lb Date of Birth:  June 23, 1960        BSA:          1.99 m Patient Age:    59 years        BP:           99/67 mmHg Patient Gender: F               HR:           60 bpm. Exam Location:  Inpatient Procedure: 2D Echo and Intracardiac Opacification Agent Indications:    CHF-Acute Systolic 322.02 / R42.70  History:        Patient has prior history of Echocardiogram examinations, most                 recent 04/10/2017. CHF, Biventricular ICD, Stroke;                 Arrythmias:Atrial Fibrillation. Chronic kidney disease.                 Congenital heart block. GERD.  Sonographer:    [name] RDCS Referring Phys: [place]  1. Left ventricular ejection fraction, by visual estimation, is <20%. The left ventricle has severely decreased function. There is no left ventricular hypertrophy.  2. Definity contrast agent was given IV to delineate the left ventricular endocardial borders.  3. Abnormal septal motion consistent with left bundle branch block.  4. Left ventricular diastolic parameters are consistent with Grade III diastolic dysfunction (restrictive).  5. Moderately dilated left ventricular internal cavity size.  6. The left ventricle demonstrates global hypokinesis.  7. Global right ventricle has mildly reduced systolic function.The right ventricular size is severely enlarged. No increase in right ventricular wall thickness.  8. Left atrial size was severely dilated.  9. Right atrial size was severely dilated. 10. Trivial pericardial effusion is present. 11. The mitral valve is normal in structure. No  evidence of mitral valve regurgitation. 12. The tricuspid  valve is normal in structure. 13. The aortic valve is normal in structure. Aortic valve regurgitation is not visualized. Mild aortic valve sclerosis without stenosis. 14. Pulmonic regurgitation is moderate. 15. The pulmonic valve was normal in structure. Pulmonic valve regurgitation is moderate. 16. Mildly dilated pulmonary artery. 17. Mildly elevated pulmonary artery systolic pressure. 18. The tricuspid regurgitant velocity is 2.32 m/s, and with an assumed right atrial pressure of 15 mmHg, the estimated right ventricular systolic pressure is mildly elevated at 36.6 mmHg. 19. A pacer wire is visualized. 20. The inferior vena cava is dilated in size with <50% respiratory variability, suggesting right atrial pressure of 15 mmHg. 21. No intracardiac thrombi or masses were visualized. 22. No significant change from prior study (April 10, 2017). 23. Prior images reviewed side by side. FINDINGS  Left Ventricle: Left ventricular ejection fraction, by visual estimation, is <20%. The left ventricle has severely decreased function. Definity contrast agent was given IV to delineate the left ventricular endocardial borders. The left ventricle demonstrates global hypokinesis. The left ventricular internal cavity size was moderately dilated left ventricle. There is no left ventricular hypertrophy. Abnormal (paradoxical) septal motion, consistent with left bundle branch block. The left ventricular diastology could not be evaluated due to atrial fibrillation. Left ventricular diastolic parameters are consistent with Grade III diastolic dysfunction (restrictive). Right Ventricle: The right ventricular size is severely enlarged. No increase in right ventricular wall thickness. Global RV systolic function is has mildly reduced systolic function. The tricuspid regurgitant velocity is 2.32 m/s, and with an assumed right atrial pressure of 15 mmHg, the estimated right ventricular systolic pressure is mildly elevated at 36.6 mmHg. Left  Atrium: Left atrial size was severely dilated. Right Atrium: Right atrial size was severely dilated Pericardium: Trivial pericardial effusion is present. Mitral Valve: The mitral valve is normal in structure. No evidence of mitral valve regurgitation, with centrally-directed jet. Tricuspid Valve: The tricuspid valve is normal in structure. Tricuspid valve regurgitation is severe. Aortic Valve: The aortic valve is normal in structure. Aortic valve regurgitation is not visualized. Mild aortic valve sclerosis is present, with no evidence of aortic valve stenosis. Pulmonic Valve: The pulmonic valve was normal in structure. Pulmonic valve regurgitation is moderate. Pulmonic regurgitation is moderate. Aorta: The aortic root and ascending aorta are structurally normal, with no evidence of dilitation. Pulmonary Artery: The pulmonary artery is mildly dilated. Venous: The inferior vena cava is dilated in size with less than 50% respiratory variability, suggesting right atrial pressure of 15 mmHg. IAS/Shunts: No atrial level shunt detected by color flow Doppler. Additional Comments: No intracardiac thrombi or masses were visualized. A pacer wire is visualized.  LEFT VENTRICLE PLAX 2D LVIDd:         6.10 cm       Diastology LVIDs:         5.20 cm       LV e' lateral:   5.76 cm/s LV PW:         1.00 cm       LV E/e' lateral: 18.0 LV IVS:        1.00 cm       LV e' medial:    4.95 cm/s LVOT diam:     1.80 cm       LV E/e' medial:  21.0 LV SV:         57 ml LV SV Index:   27.63 LVOT Area:     2.54 cm  LV Volumes (MOD) LV area d, A2C:    40.10 cm LV area d, A4C:    46.10 cm LV area s, A2C:    36.70 cm LV area s, A4C:    40.70 cm LV major d, A2C:   8.43 cm LV major d, A4C:   8.80 cm LV major s, A2C:   8.23 cm LV major s, A4C:   8.19 cm LV vol d, MOD A2C: 159.0 ml LV vol d, MOD A4C: 201.0 ml LV vol s, MOD A2C: 139.0 ml LV vol s, MOD A4C: 162.0 ml LV SV MOD A2C:     20.0 ml LV SV MOD A4C:     201.0 ml LV SV MOD BP:      32.6 ml  RIGHT VENTRICLE RV Basal diam:  5.61 cm RV S prime:     11.40 cm/s TAPSE (M-mode): 1.8 cm LEFT ATRIUM              Index       RIGHT ATRIUM           Index LA diam:        4.40 cm  2.21 cm/m  RA Area:     32.60 cm LA Vol (A2C):   168.0 ml 84.29 ml/m RA Volume:   128.00 ml 64.22 ml/m LA Vol (A4C):   161.0 ml 80.78 ml/m LA Biplane Vol: 174.0 ml 87.30 ml/m  AORTIC VALVE LVOT Vmax:   120.00 cm/s LVOT Vmean:  69.500 cm/s LVOT VTI:    0.170 m  AORTA Ao Root diam: 2.40 cm Ao Asc diam:  3.30 cm MITRAL VALVE                TRICUSPID VALVE MV Area (PHT): 5.97 cm     TR Peak grad:   21.6 mmHg MV PHT:        36.83 msec   TR Vmax:        286.00 cm/s MV Decel Time: 127 msec MV E velocity: 104.00 cm/s  SHUNTS                             Systemic VTI:  0.17 m                             Systemic Diam: 1.80 cm  Dani Gobble Croitoru MD Electronically signed by Sanda Klein MD Signature Date/Time: 03/09/2019/3:16:38 PM    Final    [place] EKG SITE RITE  Result Date: 03/08/2019 If Site Rite image not attached, placement could not be confirmed due to current cardiac rhythm.  Medications: Infusions: . sodium chloride Stopped (03/09/19 0034)  . DOBUTamine 4 mcg/kg/min (03/09/19 0839)  . furosemide 160 mg (03/09/19 2321)    Scheduled Medications: . calcitRIOL  0.5 mcg Oral Daily  . insulin aspart  0-9 Units Subcutaneous TID WC  . sodium chloride flush  3 mL Intravenous Q12H  . sodium chloride flush  3 mL Intravenous Q12H    have reviewed scheduled and prn medications.  Physical Exam: General: alert, pleasant-  NAD- no sxms of uremia Heart: RRR Lungs: dec BS at bases Abdomen: soft, non tender Extremities: pitting edema throughout    03/10/2019,8:16 AM  LOS: 2 days

## 2019-03-10 NOTE — Progress Notes (Signed)
PROGRESS NOTE    [name]  OTL:[id number] DOB: December 25, 1960 DOA: 03/07/2019 PCP: [name], MD   Brief Narrative: 59 year old with past medical history significant for MGUS, thrombocytopenia, A. fib on Coumadin, CHB with pacemaker, nonischemic cardiomyopathy with ejection fraction 15 to 20% with ICD, CKD stage IV baseline creatinine 3.0, cirrhosis who presented with several weeks of progressive swelling about 30 pound weight gain.  Evaluation in the ED patient was found to have a creatinine more than 5, anasarca, hemoglobin of 11.  Chest x-ray was clear but she required 2 L of supplemental oxygen.  Patient received 40 mg of IV Lasix without significant urine output.  Patient was transferred to [facility] for heart failure team evaluation, patient was a started on inotrope.    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 Started on dobutamine drip. Continue with IV Laxis 160 mg TID>  Urine Out put 1.7 L yesterday.  Plan to continue with current management  Cath on 03-10-2019; PA 45/19.  ECHO Ef 20 % , Severe TR.   2-Acute on chronic renal failure stage IV: Prior creatinine 2.9--3.5 last year. Considering cardiorenal syndrome. Nephrology following for need of CRRT.   3-Anemia; MGUS with Thrombocytopenia;  Follow up with Dr Delton Coombes.  M spike 22/28. Kappa free light chain 1392. B 2 microglobulin 9.6.  electrophoresis protein and kappa/lamda ratio pending.  Appreciate Oncology assistance.  Might need Bone marrow Biopsy.   4-Chronic A fib; continue with coumadin.    5-Diabetes; SSI for now due to hypoglycemia.   6-Hyponatremia; related to hypervolemia.  Continue with diuresis.   7-Abdominal Firmness, CT negative for Hematoma.  Postraumatic seroma/mild Sherry Ruffing lesion, patient denies any recent trauma. Probably old injury.   8-Secondary Hyperparathyroidism; Hyperkalemia; mild on lasix. Resolved.  Hyponatremia; related to renal failure, heart failure.   Cirrhosis;      Estimated body mass index is 31.6 kg/m as calculated from the following:   Height as of this encounter: 5' 6"  (1.676 m).   Weight as of this encounter: 88.8 kg.   DVT prophylaxis: On Coumadin Code Status: Full code Family Communication: Care discussed with patient Disposition Plan:  Remain in the hospital for management of HF, renal failure.  Consultants:   Cardiology  Nephrology   Procedures:   ECHO    Antimicrobials:  None  Subjective: Doing ok, denies worsening dyspnea.   Objective: Vitals:   03/09/19 1915 03/09/19 1950 03/09/19 2350 03/10/19 0248  BP:   101/60 (!) 101/59  Pulse:    60  Resp:   (!) 22 12  Temp:   97.9 F (36.6 C) 97.9 F (36.6 C)  TempSrc:   Oral Oral  SpO2: (!) 88% 93% 94% 96%  Weight:    88.8 kg  Height:        Intake/Output Summary (Last 24 hours) at 03/10/2019 0752 Last data filed at 03/10/2019 0252 Gross per 24 hour  Intake 282.67 ml  Output 1750 ml  Net -1467.33 ml   Filed Weights   03/08/19 1719 03/09/19 0500 03/10/19 0248  Weight: 90.1 kg 90 kg  88.8 kg    Examination:  General exam: NAD Respiratory system: Normal respiratory effort, crackles bases Cardiovascular system: S 1, S 2 RRR Gastrointestinal system: BS present, soft, nt Central nervous system: Non focal.  Extremities: Symmetric power Skin: No rashes    Data Reviewed: I have personally reviewed following labs and imaging studies  CBC: Recent Labs  Lab 03/07/19 2101 03/08/19 0927 03/09/19 0228 03/09/19 1558 03/09/19 1559  WBC 6.8 7.2  7.5  --   --   NEUTROABS 5.5  --  6.2  --   --   HGB 10.9* 10.8* 10.3* 11.6* 11.2*  HCT 33.3* 32.8* 30.1* 34.0* 33.0*  MCV 84.9 83.7 80.9  --   --   PLT 139* 123* 133*  --   --    Basic Metabolic Panel: Recent Labs  Lab 03/07/19 2101 03/08/19 0927 03/09/19 0228 03/09/19 1558 03/09/19 1559  NA 127* 126* 127* 129* 128*  K 5.3* 5.2* 5.2* 4.5 4.6  CL 94* 93* 93*  --   --   CO2 20* 17* 18*  --   --   GLUCOSE 77 49* 201*  --   --   BUN 118* 120* 129*  --   --   CREATININE 5.04* 4.97* 5.09*  --   --   CALCIUM 8.4* 7.9* 7.9*  --   --    GFR: Estimated Creatinine Clearance: 13.4 mL/min (A) (by C-G formula based on SCr of 5.09 mg/dL (H)). Liver Function Tests: Recent Labs  Lab 03/07/19 2101 03/08/19 0927  AST 23 24  ALT 17 16  ALKPHOS 324* 303*  BILITOT 3.0* 3.3*  PROT 7.7 7.3  ALBUMIN 2.9* 2.7*   No results for input(s): LIPASE, AMYLASE in the last 168 hours. No results for input(s): AMMONIA in the last 168 hours. Coagulation Profile: Recent Labs  Lab 03/04/19 1151 03/07/19 2101 03/08/19 0927 03/09/19 0228  INR 5.1* 3.0* 3.1* 3.1*   Cardiac Enzymes: No results for input(s): CKTOTAL, CKMB, CKMBINDEX, TROPONINI in the last 168 hours. BNP (last 3 results) No results for input(s): PROBNP in the last 8760 hours. HbA1C: No results for input(s): HGBA1C in the last 72 hours. CBG: Recent Labs  Lab 03/09/19 0547 03/09/19 1131 03/09/19 1654 03/09/19 2133 03/10/19 0622  GLUCAP 150* 102* 83 189* 162*   Lipid Profile: No results for input(s): CHOL, HDL, LDLCALC, TRIG, CHOLHDL, LDLDIRECT in the last 72 hours. Thyroid Function Tests: No results for input(s): TSH, T4TOTAL, FREET4, T3FREE, THYROIDAB in the last 72 hours. Anemia Panel: No results for input(s): VITAMINB12, FOLATE, FERRITIN, TIBC, IRON, RETICCTPCT in the last 72 hours. Sepsis Labs: No results for input(s): PROCALCITON, LATICACIDVEN in the last 168 hours.  Recent Results (from the past 240 hour(s))   Respiratory Panel by RT PCR (Flu A&B, Covid) - Nasopharyngeal Swab     Status: None   Collection Time: 03/07/19  9:21 PM   Specimen: Nasopharyngeal Swab  Result Value Ref Range Status   SARS Coronavirus 2 by RT PCR NEGATIVE NEGATIVE Final    Comment: (NOTE) SARS-CoV-2 target nucleic acids are NOT DETECTED. The SARS-CoV-2 RNA is generally detectable in upper respiratoy specimens during the acute phase of infection. The lowest concentration of SARS-CoV-2 viral copies this assay can detect is 131 copies/mL. A negative result does not preclude SARS-Cov-2 infection and should not be used as the sole basis for treatment or other patient management decisions. A negative result may occur with  improper specimen collection/handling, submission of specimen other than nasopharyngeal swab, presence  of viral mutation(s) within the areas targeted by this assay, and inadequate number of viral copies (<131 copies/mL). A negative result must be combined with clinical observations, patient history, and epidemiological information. The expected result is Negative. Fact Sheet for Patients:  PinkCheek.be Fact Sheet for Healthcare Providers:  GravelBags.it This test is not yet ap proved or cleared by the [place] FDA and  has been authorized for detection and/or diagnosis of SARS-CoV-2 by FDA under an Emergency Use Authorization (EUA). This EUA will remain  in effect (meaning this test can be used) for the duration of the COVID-19 declaration under Section 564(b)(1) of the Act, 21 U.S.C. section 360bbb-3(b)(1), unless the authorization is terminated or revoked sooner.    Influenza A by PCR NEGATIVE NEGATIVE Final   Influenza B by PCR NEGATIVE NEGATIVE Final    Comment: (NOTE) The Xpert Xpress SARS-CoV-2/FLU/RSV assay is intended as an aid in  the diagnosis of influenza from Nasopharyngeal swab specimens and  should not be used as a sole  basis for treatment. Nasal washings and  aspirates are unacceptable for Xpert Xpress SARS-CoV-2/FLU/RSV  testing. Fact Sheet for Patients: PinkCheek.be Fact Sheet for Healthcare Providers: GravelBags.it This test is not yet approved or cleared by the [place] FDA and  has been authorized for detection and/or diagnosis of SARS-CoV-2 by  FDA under an Emergency Use Authorization (EUA). This EUA will remain  in effect (meaning this test can be used) for the duration of the  Covid-19 declaration under Section 564(b)(1) of the Act, 21  U.S.C. section 360bbb-3(b)(1), unless the authorization is  terminated or revoked. Performed at [facility], [address] [place], [place] [address]   MRSA PCR Screening     Status: None   Collection Time: 03/08/19 10:26 PM   Specimen: Nasopharyngeal  Result Value Ref Range Status   MRSA by PCR NEGATIVE NEGATIVE Final    Comment:        The GeneXpert MRSA Assay (FDA approved for NASAL specimens only), is one component of a comprehensive MRSA colonization surveillance program. It is not intended to diagnose MRSA infection nor to guide or monitor treatment for MRSA infections. Performed at [facility], [place] [address] [place],  [address]          Radiology Studies: CARDIAC CATHETERIZATION  Result Date: 03/09/2019 1. Right > left heart failure with low PAPI. 2. Pulmonary venous hypertension. 3. Adequate cardiac output on dobutamine 2.5 mcg/kg/min  ECHOCARDIOGRAM COMPLETE  Result Date: 03/09/2019   ECHOCARDIOGRAM REPORT   Patient Name:   ZEA KOSTKA Date of Exam: 03/09/2019 Medical Rec #:  [id number]       Height:       66.0 in Accession #:    [id number]      Weight:       198.4 lb Date of Birth:  1960/12/16        BSA:          1.99 m Patient Age:    76 years        BP:           99/67 mmHg Patient Gender: F               HR:           60 bpm. Exam Location:  Inpatient  Procedure: 2D Echo and Intracardiac Opacification Agent Indications:    CHF-Acute Systolic 678.93 / Y10.17  History:        Patient has prior history  of Echocardiogram examinations, most                 recent 04/10/2017. CHF, Biventricular ICD, Stroke;                 Arrythmias:Atrial Fibrillation. Chronic kidney disease.                 Congenital heart block. GERD.  Sonographer:    [name] RDCS Referring Phys: [place]  1. Left ventricular ejection fraction, by visual estimation, is <20%. The left ventricle has severely decreased function. There is no left ventricular hypertrophy.  2. Definity contrast agent was given IV to delineate the left ventricular endocardial borders.  3. Abnormal septal motion consistent with left bundle branch block.  4. Left ventricular diastolic parameters are consistent with Grade III diastolic dysfunction (restrictive).  5. Moderately dilated left ventricular internal cavity size.  6. The left ventricle demonstrates global hypokinesis.  7. Global right ventricle has mildly reduced systolic function.The right ventricular size is severely enlarged. No increase in right ventricular wall thickness.  8. Left atrial size was severely dilated.  9. Right atrial size was severely dilated. 10. Trivial pericardial effusion is present. 11. The mitral valve is normal in structure. No evidence of mitral valve regurgitation. 12. The tricuspid valve is normal in structure. 13. The aortic valve is normal in structure. Aortic valve regurgitation is not visualized. Mild aortic valve sclerosis without stenosis. 14. Pulmonic regurgitation is moderate. 15. The pulmonic valve was normal in structure. Pulmonic valve regurgitation is moderate. 16. Mildly dilated pulmonary artery. 17. Mildly elevated pulmonary artery systolic pressure. 18. The tricuspid regurgitant velocity is 2.32 m/s, and with an assumed right atrial pressure of 15 mmHg, the estimated right ventricular  systolic pressure is mildly elevated at 36.6 mmHg. 19. A pacer wire is visualized. 20. The inferior vena cava is dilated in size with <50% respiratory variability, suggesting right atrial pressure of 15 mmHg. 21. No intracardiac thrombi or masses were visualized. 22. No significant change from prior study (April 10, 2017). 23. Prior images reviewed side by side. FINDINGS  Left Ventricle: Left ventricular ejection fraction, by visual estimation, is <20%. The left ventricle has severely decreased function. Definity contrast agent was given IV to delineate the left ventricular endocardial borders. The left ventricle demonstrates global hypokinesis. The left ventricular internal cavity size was moderately dilated left ventricle. There is no left ventricular hypertrophy. Abnormal (paradoxical) septal motion, consistent with left bundle branch block. The left ventricular diastology could not be evaluated due to atrial fibrillation. Left ventricular diastolic parameters are consistent with Grade III diastolic dysfunction (restrictive). Right Ventricle: The right ventricular size is severely enlarged. No increase in right ventricular wall thickness. Global RV systolic function is has mildly reduced systolic function. The tricuspid regurgitant velocity is 2.32 m/s, and with an assumed right atrial pressure of 15 mmHg, the estimated right ventricular systolic pressure is mildly elevated at 36.6 mmHg. Left Atrium: Left atrial size was severely dilated. Right Atrium: Right atrial size was severely dilated Pericardium: Trivial pericardial effusion is present. Mitral Valve: The mitral valve is normal in structure. No evidence of mitral valve regurgitation, with centrally-directed jet. Tricuspid Valve: The tricuspid valve is normal in structure. Tricuspid valve regurgitation is severe. Aortic Valve: The aortic valve is normal in structure. Aortic valve regurgitation is not visualized. Mild aortic valve sclerosis is present,  with no evidence of aortic valve stenosis. Pulmonic Valve: The pulmonic valve was normal in structure. Pulmonic  valve regurgitation is moderate. Pulmonic regurgitation is moderate. Aorta: The aortic root and ascending aorta are structurally normal, with no evidence of dilitation. Pulmonary Artery: The pulmonary artery is mildly dilated. Venous: The inferior vena cava is dilated in size with less than 50% respiratory variability, suggesting right atrial pressure of 15 mmHg. IAS/Shunts: No atrial level shunt detected by color flow Doppler. Additional Comments: No intracardiac thrombi or masses were visualized. A pacer wire is visualized.  LEFT VENTRICLE PLAX 2D LVIDd:         6.10 cm       Diastology LVIDs:         5.20 cm       LV e' lateral:   5.76 cm/s LV PW:         1.00 cm       LV E/e' lateral: 18.0 LV IVS:        1.00 cm       LV e' medial:    4.95 cm/s LVOT diam:     1.80 cm       LV E/e' medial:  21.0 LV SV:         57 ml LV SV Index:   27.63 LVOT Area:     2.54 cm  LV Volumes (MOD) LV area d, A2C:    40.10 cm LV area d, A4C:    46.10 cm LV area s, A2C:    36.70 cm LV area s, A4C:    40.70 cm LV major d, A2C:   8.43 cm LV major d, A4C:   8.80 cm LV major s, A2C:   8.23 cm LV major s, A4C:   8.19 cm LV vol d, MOD A2C: 159.0 ml LV vol d, MOD A4C: 201.0 ml LV vol s, MOD A2C: 139.0 ml LV vol s, MOD A4C: 162.0 ml LV SV MOD A2C:     20.0 ml LV SV MOD A4C:     201.0 ml LV SV MOD BP:      32.6 ml RIGHT VENTRICLE RV Basal diam:  5.61 cm RV S prime:     11.40 cm/s TAPSE (M-mode): 1.8 cm LEFT ATRIUM              Index       RIGHT ATRIUM           Index LA diam:        4.40 cm  2.21 cm/m  RA Area:     32.60 cm LA Vol (A2C):   168.0 ml 84.29 ml/m RA Volume:   128.00 ml 64.22 ml/m LA Vol (A4C):   161.0 ml 80.78 ml/m LA Biplane Vol: 174.0 ml 87.30 ml/m  AORTIC VALVE LVOT Vmax:   120.00 cm/s LVOT Vmean:  69.500 cm/s LVOT VTI:    0.170 m  AORTA Ao Root diam: 2.40 cm Ao Asc diam:  3.30 cm MITRAL VALVE                 TRICUSPID VALVE MV Area (PHT): 5.97 cm     TR Peak grad:   21.6 mmHg MV PHT:        36.83 msec   TR Vmax:        286.00 cm/s MV Decel Time: 127 msec MV E velocity: 104.00 cm/s  SHUNTS                             Systemic VTI:  0.17 m  Systemic Diam: 1.80 cm  Sanda Klein MD Electronically signed by Sanda Klein MD Signature Date/Time: 03/09/2019/3:16:38 PM    Final    [place] EKG SITE RITE  Result Date: 03/08/2019 If Site Rite image not attached, placement could not be confirmed due to current cardiac rhythm.       Scheduled Meds: . calcitRIOL  0.5 mcg Oral Daily  . insulin aspart  0-9 Units Subcutaneous TID WC  . sodium chloride flush  3 mL Intravenous Q12H  . sodium chloride flush  3 mL Intravenous Q12H   Continuous Infusions: . sodium chloride Stopped (03/09/19 0034)  . DOBUTamine 4 mcg/kg/min (03/09/19 0839)  . furosemide 160 mg (03/09/19 2321)     LOS: 2 days    Time spent: 35 minutes.     Elmarie Shiley, MD [facility]   If 7PM-7AM, please contact night-coverage [email or web address] Password [facility] 03/10/2019, 7:52 AM

## 2019-03-10 NOTE — Progress Notes (Signed)
ANTICOAGULATION CONSULT NOTE - [place] for Coumadin Indication: atrial fibrillation  Allergies  Allergen Reactions  . Wheat Swelling  . Latex Rash  . Penicillins Rash  . Sulfa Antibiotics Rash    Patient Measurements: Height: 5\' 6"  (167.6 cm) Weight: 195 lb 12.3 oz (88.8 kg) IBW/kg (Calculated) : 59.3  Vital Signs: Temp: 97.8 F (36.6 C) (01/13 0800) Temp Source: Oral (01/13 0800) BP: 93/49 (01/13 0802) Pulse Rate: 60 (01/13 0800)  Labs: Recent Labs    03/07/19 2101 03/07/19 2306 03/08/19 0927 03/09/19 0228 03/09/19 1558 03/09/19 1559 03/10/19 0806  HGB 10.9*  --  10.8* 10.3* 11.6* 11.2* 9.8*  HCT 33.3*  --  32.8* 30.1* 34.0* 33.0* 29.1*  PLT 139*  --  123* 133*  --   --  127*  LABPROT 31.3*  --  31.7* 32.1*  --   --  29.5*  INR 3.0*  --  3.1* 3.1*  --   --  2.8*  CREATININE 5.04*  --  4.97* 5.09*  --   --  5.11*  TROPONINIHS 28* 27*  --   --   --   --   --     Estimated Creatinine Clearance: 13.3 mL/min (A) (by C-G formula based on SCr of 5.11 mg/dL (H)).   Medical History: Past Medical History:  Diagnosis Date  . Automatic implantable cardioverter-defibrillator in situ    a. 03/2013: BSX Energen CRTD BiV ICC, ser# [id number]  . Chronic anticoagulation    a. coumadin.  . Chronic atrial fibrillation (HCC)    embolic rind  . Chronic kidney disease    creatinine- 1.8 in 09/2006 and 2.0 in 05/2007; 2.05 in 2011, 2.29 in 2012  . Chronic systolic CHF (congestive heart failure) ([place])    a. 03/2013 Echo: Sev LV dysfxn with sev diff HK, restrictive phys, diast dysfxn, mild MR, sev dil LA/RA, mod PR, PASP 21mmHg.  . Congenital third degree heart block    a. Guidant VVI pacemaker implanted in 09/1997; b. gen change in 01/2004;  c. 03/2013 upgrade to [place], ser# [id number].  . Dizziness and giddiness 05/19/2012  . Dysphagia 08/14/2011   FEB 2013 EGD/DIL 16 MM; GERD; h/o gastroesophageal reflux disease; + H. Pylori gastritis   . GERD  (gastroesophageal reflux disease)   . Helicobacter pylori gastritis 08/14/2011  . IDDM (insulin dependent diabetes mellitus)   . Kidney stones 1990's  . Nonischemic cardiomyopathy ([place])    a. 03/2013 Echo: Sev LV dysfxn with sev diff HK, restrictive phys, diast dysfxn, mild MR, sev dil LA/RA, mod PR, PASP 87mmHg;  b. 03/2013: BSX Energen CRTD BiV ICC, ser# [id number].  Marland Kitchen Potassium (K) excess   . Stroke [facility]) 1999   denies residual on 04/21/2013     Assessment: 40 yoF on warfarin PTA for hx AFib admitted with acute CHF. INR 2.8, H/H stable - may need HD lines.   Goal of Therapy:  INR 2-3 Monitor platelets by anticoagulation protocol: Yes   Plan:  -Hold warfarin - may need procedures/lines -Follow protime   Arrie Senate, PharmD, BCPS Clinical Pharmacist [phone] Please check AMION for all [facility] numbers 03/10/2019

## 2019-03-10 NOTE — Progress Notes (Signed)
Patient ID: [name], female   DOB: September 03, 1960, 59 y.o.   MRN: [id number]     Advanced Heart Failure Rounding Note  PCP-Cardiologist: Kate Sable, MD   Subjective:    Dobutamine 2.5 started empirically on 1/11.  She is getting high dose IV Lasix and had about 1750 cc total UOP yesterday, somewhat better than the day before.   BUN/creatinine 120/4.97 => 129/5.09 => pending (for unclear reasons labs not drawn yet today).     No complaints this morning, no dyspnea at rest or walking to potty chair.   Echo: EF < 20%, no LVH, mildly reduced RV systolic function, severe biatrial enlargement, severe TR.  RHC Procedural Findings (dobutamine 2.5 mcg/kg/min): Hemodynamics (mmHg) RA 18 RV 43/18 PA 45/19, mean 28 PCWP mean 19 Oxygen saturations: PA 63% AO 94% Cardiac Output (Fick) 6.1  Cardiac Index (Fick) 3.06 PVR 1.5 WU PAPI 1.4   Objective:   Weight Range: 88.8 kg Body mass index is 31.6 kg/m.   Vital Signs:   Temp:  [97.7 F (36.5 C)-99.1 F (37.3 C)] 97.9 F (36.6 C) (01/13 0248) Pulse Rate:  [0-92] 60 (01/13 0248) Resp:  [0-22] 12 (01/13 0248) BP: (90-146)/(54-77) 101/59 (01/13 0248) SpO2:  [0 %-98 %] 96 % (01/13 0248) Weight:  [88.8 kg] 88.8 kg (01/13 0248) Last BM Date: 03/08/19  Weight change: Filed Weights   03/08/19 1719 03/09/19 0500 03/10/19 0248  Weight: 90.1 kg 90 kg 88.8 kg    Intake/Output:   Intake/Output Summary (Last 24 hours) at 03/10/2019 0751 Last data filed at 03/10/2019 0252 Gross per 24 hour  Intake 282.67 ml  Output 1750 ml  Net -1467.33 ml      Physical Exam    General: NAD Neck: JVP 16 cm, no thyromegaly or thyroid nodule.  Lungs: Clear to auscultation bilaterally with normal respiratory effort. CV: Lateral PMI.  Heart regular S1/S2, +S3, 3/6 HSM LLSB.  1+ edema to knees.  Abdomen: Soft, nontender, no hepatosplenomegaly, no distention.  Skin: Intact without lesions or rashes.  Neurologic: Alert and oriented x 3.    Psych: Normal affect. Extremities: No clubbing or cyanosis.  HEENT: Normal.    Telemetry   Atrial fibrillation with BiV pacing, rate 60s (personally reviewed)  Labs    CBC Recent Labs    03/07/19 2101 03/08/19 0927 03/09/19 0228 03/09/19 1558 03/09/19 1559  WBC 6.8 7.2 7.5  --   --   NEUTROABS 5.5  --  6.2  --   --   HGB 10.9* 10.8* 10.3* 11.6* 11.2*  HCT 33.3* 32.8* 30.1* 34.0* 33.0*  MCV 84.9 83.7 80.9  --   --   PLT 139* 123* 133*  --   --    Basic Metabolic Panel Recent Labs    03/08/19 0927 03/09/19 0228 03/09/19 1558 03/09/19 1559  NA 126* 127* 129* 128*  K 5.2* 5.2* 4.5 4.6  CL 93* 93*  --   --   CO2 17* 18*  --   --   GLUCOSE 49* 201*  --   --   BUN 120* 129*  --   --   CREATININE 4.97* 5.09*  --   --   CALCIUM 7.9* 7.9*  --   --    Liver Function Tests Recent Labs    03/07/19 2101 03/08/19 0927  AST 23 24  ALT 17 16  ALKPHOS 324* 303*  BILITOT 3.0* 3.3*  PROT 7.7 7.3  ALBUMIN 2.9* 2.7*   No results for input(s):  LIPASE, AMYLASE in the last 72 hours. Cardiac Enzymes No results for input(s): CKTOTAL, CKMB, CKMBINDEX, TROPONINI in the last 72 hours.  BNP: BNP (last 3 results) Recent Labs    03/07/19 2101  BNP 1,072.0*    ProBNP (last 3 results) No results for input(s): PROBNP in the last 8760 hours.   D-Dimer No results for input(s): DDIMER in the last 72 hours. Hemoglobin A1C No results for input(s): HGBA1C in the last 72 hours. Fasting Lipid Panel No results for input(s): CHOL, HDL, LDLCALC, TRIG, CHOLHDL, LDLDIRECT in the last 72 hours. Thyroid Function Tests No results for input(s): TSH, T4TOTAL, T3FREE, THYROIDAB in the last 72 hours.  Invalid input(s): FREET3  Other results:   Imaging    CARDIAC CATHETERIZATION  Result Date: 03/09/2019 1. Right > left heart failure with low PAPI. 2. Pulmonary venous hypertension. 3. Adequate cardiac output on dobutamine 2.5 mcg/kg/min  ECHOCARDIOGRAM COMPLETE  Result Date:  03/09/2019   ECHOCARDIOGRAM REPORT   Patient Name:   [name] Date of Exam: 03/09/2019 Medical Rec #:  [id number]       Height:       66.0 in Accession #:    [id number]      Weight:       198.4 lb Date of Birth:  Aug 05, 1960        BSA:          1.99 m Patient Age:    59 years        BP:           99/67 mmHg Patient Gender: F               HR:           60 bpm. Exam Location:  Inpatient Procedure: 2D Echo and Intracardiac Opacification Agent Indications:    CHF-Acute Systolic 700.17 / C94.49  History:        Patient has prior history of Echocardiogram examinations, most                 recent 04/10/2017. CHF, Biventricular ICD, Stroke;                 Arrythmias:Atrial Fibrillation. Chronic kidney disease.                 Congenital heart block. GERD.  Sonographer:    [name] RDCS Referring Phys: [place]  1. Left ventricular ejection fraction, by visual estimation, is <20%. The left ventricle has severely decreased function. There is no left ventricular hypertrophy.  2. Definity contrast agent was given IV to delineate the left ventricular endocardial borders.  3. Abnormal septal motion consistent with left bundle branch block.  4. Left ventricular diastolic parameters are consistent with Grade III diastolic dysfunction (restrictive).  5. Moderately dilated left ventricular internal cavity size.  6. The left ventricle demonstrates global hypokinesis.  7. Global right ventricle has mildly reduced systolic function.The right ventricular size is severely enlarged. No increase in right ventricular wall thickness.  8. Left atrial size was severely dilated.  9. Right atrial size was severely dilated. 10. Trivial pericardial effusion is present. 11. The mitral valve is normal in structure. No evidence of mitral valve regurgitation. 12. The tricuspid valve is normal in structure. 13. The aortic valve is normal in structure. Aortic valve regurgitation is not visualized. Mild aortic valve  sclerosis without stenosis. 14. Pulmonic regurgitation is moderate. 15. The pulmonic valve was normal in structure. Pulmonic valve regurgitation  is moderate. 16. Mildly dilated pulmonary artery. 17. Mildly elevated pulmonary artery systolic pressure. 18. The tricuspid regurgitant velocity is 2.32 m/s, and with an assumed right atrial pressure of 15 mmHg, the estimated right ventricular systolic pressure is mildly elevated at 36.6 mmHg. 19. A pacer wire is visualized. 20. The inferior vena cava is dilated in size with <50% respiratory variability, suggesting right atrial pressure of 15 mmHg. 21. No intracardiac thrombi or masses were visualized. 22. No significant change from prior study (April 10, 2017). 23. Prior images reviewed side by side. FINDINGS  Left Ventricle: Left ventricular ejection fraction, by visual estimation, is <20%. The left ventricle has severely decreased function. Definity contrast agent was given IV to delineate the left ventricular endocardial borders. The left ventricle demonstrates global hypokinesis. The left ventricular internal cavity size was moderately dilated left ventricle. There is no left ventricular hypertrophy. Abnormal (paradoxical) septal motion, consistent with left bundle branch block. The left ventricular diastology could not be evaluated due to atrial fibrillation. Left ventricular diastolic parameters are consistent with Grade III diastolic dysfunction (restrictive). Right Ventricle: The right ventricular size is severely enlarged. No increase in right ventricular wall thickness. Global RV systolic function is has mildly reduced systolic function. The tricuspid regurgitant velocity is 2.32 m/s, and with an assumed right atrial pressure of 15 mmHg, the estimated right ventricular systolic pressure is mildly elevated at 36.6 mmHg. Left Atrium: Left atrial size was severely dilated. Right Atrium: Right atrial size was severely dilated Pericardium: Trivial pericardial  effusion is present. Mitral Valve: The mitral valve is normal in structure. No evidence of mitral valve regurgitation, with centrally-directed jet. Tricuspid Valve: The tricuspid valve is normal in structure. Tricuspid valve regurgitation is severe. Aortic Valve: The aortic valve is normal in structure. Aortic valve regurgitation is not visualized. Mild aortic valve sclerosis is present, with no evidence of aortic valve stenosis. Pulmonic Valve: The pulmonic valve was normal in structure. Pulmonic valve regurgitation is moderate. Pulmonic regurgitation is moderate. Aorta: The aortic root and ascending aorta are structurally normal, with no evidence of dilitation. Pulmonary Artery: The pulmonary artery is mildly dilated. Venous: The inferior vena cava is dilated in size with less than 50% respiratory variability, suggesting right atrial pressure of 15 mmHg. IAS/Shunts: No atrial level shunt detected by color flow Doppler. Additional Comments: No intracardiac thrombi or masses were visualized. A pacer wire is visualized.  LEFT VENTRICLE PLAX 2D LVIDd:         6.10 cm       Diastology LVIDs:         5.20 cm       LV e' lateral:   5.76 cm/s LV PW:         1.00 cm       LV E/e' lateral: 18.0 LV IVS:        1.00 cm       LV e' medial:    4.95 cm/s LVOT diam:     1.80 cm       LV E/e' medial:  21.0 LV SV:         57 ml LV SV Index:   27.63 LVOT Area:     2.54 cm  LV Volumes (MOD) LV area d, A2C:    40.10 cm LV area d, A4C:    46.10 cm LV area s, A2C:    36.70 cm LV area s, A4C:    40.70 cm LV major d, A2C:   8.43 cm LV major d,  A4C:   8.80 cm LV major s, A2C:   8.23 cm LV major s, A4C:   8.19 cm LV vol d, MOD A2C: 159.0 ml LV vol d, MOD A4C: 201.0 ml LV vol s, MOD A2C: 139.0 ml LV vol s, MOD A4C: 162.0 ml LV SV MOD A2C:     20.0 ml LV SV MOD A4C:     201.0 ml LV SV MOD BP:      32.6 ml RIGHT VENTRICLE RV Basal diam:  5.61 cm RV S prime:     11.40 cm/s TAPSE (M-mode): 1.8 cm LEFT ATRIUM              Index       RIGHT  ATRIUM           Index LA diam:        4.40 cm  2.21 cm/m  RA Area:     32.60 cm LA Vol (A2C):   168.0 ml 84.29 ml/m RA Volume:   128.00 ml 64.22 ml/m LA Vol (A4C):   161.0 ml 80.78 ml/m LA Biplane Vol: 174.0 ml 87.30 ml/m  AORTIC VALVE LVOT Vmax:   120.00 cm/s LVOT Vmean:  69.500 cm/s LVOT VTI:    0.170 m  AORTA Ao Root diam: 2.40 cm Ao Asc diam:  3.30 cm MITRAL VALVE                TRICUSPID VALVE MV Area (PHT): 5.97 cm     TR Peak grad:   21.6 mmHg MV PHT:        36.83 msec   TR Vmax:        286.00 cm/s MV Decel Time: 127 msec MV E velocity: 104.00 cm/s  SHUNTS                             Systemic VTI:  0.17 m                             Systemic Diam: 1.80 cm  Dani Gobble Croitoru MD Electronically signed by Sanda Klein MD Signature Date/Time: 03/09/2019/3:16:38 PM    Final      Medications:     Scheduled Medications: . calcitRIOL  0.5 mcg Oral Daily  . insulin aspart  0-9 Units Subcutaneous TID WC  . sodium chloride flush  3 mL Intravenous Q12H  . sodium chloride flush  3 mL Intravenous Q12H    Infusions: . sodium chloride Stopped (03/09/19 0034)  . DOBUTamine 4 mcg/kg/min (03/09/19 0839)  . furosemide 160 mg (03/09/19 2321)    PRN Medications: sodium chloride, acetaminophen **OR** acetaminophen, ondansetron **OR** ondansetron (ZOFRAN) IV, sodium chloride flush    Assessment/Plan   1. Acute on chronic systolic CHF: Longstanding NICM.  Last echo in 2/19 with EF 15-20%. She has a [employer] CRT-D device. Admitted with progressive dyspnea and 30 lb weight gain. She is very volume overloaded on exam, complicated by SBP in 18A and AKI on CKD stage 4. Empirically started on dobutamine 2.5 due to concern for low output. RHC showed R>L heart failure with low PAPI adequate cardiac output on dobutamine.  UOP not vigorous but better yesterday (1750 cc).  Labs not yet sent today.  - Continue dobutamine 2.5 mcg/kg/min.   - Lasix 160 mg IV every 8 hrs to continue, need BMET stat this  morning before making any changes.  - Stopped digoxin and  benazepril with AKI.  - Stopped beta blocker with concern for low output.  - No BP room for hydralazine/nitrates.  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  2. AKI on CKD stage 4: Baseline creatinine around 3, now up to 5 with BUN > 100.  Suspect cardiorenal syndrome superimposed on possible diabetic nephropathy.  She is unfortunately very volume overloaded as well.  Some response to high dose IV Lasix boluses.  - Continue high dose IV Lasix + dobutamine to support CO.   - BMET stat - I am worried that she will ultimately need HD.  3. Chronic atrial fibrillation: Warfarin held for procedures, INR pending.   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  5. DM: Per primary.  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  7. Tricuspid regurgitation: Severe by echo.   Length of Stay: 2  Loralie Champagne, MD  03/10/2019, 7:51 AM  Advanced Heart Failure Team Pager [phone] (M-F; 7a - 4p)  Please contact [facility] for night-coverage after hours (4p -7a ) and weekends on [email or web address]

## 2019-03-11 LAB — CBC WITH DIFFERENTIAL/PLATELET
Abs Immature Granulocytes: 0.05 10*3/uL (ref 0.00–0.07)
Basophils Absolute: 0 10*3/uL (ref 0.0–0.1)
Basophils Relative: 0 %
Eosinophils Absolute: 0.1 10*3/uL (ref 0.0–0.5)
Eosinophils Relative: 2 %
HCT: 29.6 % — ABNORMAL LOW (ref 36.0–46.0)
Hemoglobin: 10.1 g/dL — ABNORMAL LOW (ref 12.0–15.0)
Immature Granulocytes: 1 %
Lymphocytes Relative: 10 %
Lymphs Abs: 0.7 10*3/uL (ref 0.7–4.0)
MCH: 27.5 pg (ref 26.0–34.0)
MCHC: 34.1 g/dL (ref 30.0–36.0)
MCV: 80.7 fL (ref 80.0–100.0)
Monocytes Absolute: 0.5 10*3/uL (ref 0.1–1.0)
Monocytes Relative: 8 %
Neutro Abs: 5.5 10*3/uL (ref 1.7–7.7)
Neutrophils Relative %: 79 %
Platelets: 139 10*3/uL — ABNORMAL LOW (ref 150–400)
RBC: 3.67 MIL/uL — ABNORMAL LOW (ref 3.87–5.11)
RDW: 16.3 % — ABNORMAL HIGH (ref 11.5–15.5)
WBC: 6.8 10*3/uL (ref 4.0–10.5)
nRBC: 0.3 % — ABNORMAL HIGH (ref 0.0–0.2)

## 2019-03-11 LAB — RENAL FUNCTION PANEL
Albumin: 2.3 g/dL — ABNORMAL LOW (ref 3.5–5.0)
Anion gap: 19 — ABNORMAL HIGH (ref 5–15)
BUN: 150 mg/dL — ABNORMAL HIGH (ref 6–20)
CO2: 16 mmol/L — ABNORMAL LOW (ref 22–32)
Calcium: 6.9 mg/dL — ABNORMAL LOW (ref 8.9–10.3)
Chloride: 93 mmol/L — ABNORMAL LOW (ref 98–111)
Creatinine, Ser: 5.07 mg/dL — ABNORMAL HIGH (ref 0.44–1.00)
GFR calc Af Amer: 10 mL/min — ABNORMAL LOW (ref >60)
GFR calc non Af Amer: 9 mL/min — ABNORMAL LOW (ref >60)
Glucose, Bld: 159 mg/dL — ABNORMAL HIGH (ref 70–99)
Phosphorus: 7.2 mg/dL — ABNORMAL HIGH (ref 2.5–4.6)
Potassium: 4.3 mmol/L (ref 3.5–5.1)
Sodium: 128 mmol/L — ABNORMAL LOW (ref 135–145)

## 2019-03-11 LAB — PROTIME-INR
INR: 2.4 — ABNORMAL HIGH (ref 0.8–1.2)
Prothrombin Time: 26 seconds — ABNORMAL HIGH (ref 11.4–15.2)

## 2019-03-11 LAB — PARATHYROID HORMONE, INTACT (NO CA): PTH: 111 pg/mL — ABNORMAL HIGH (ref 15–65)

## 2019-03-11 LAB — GLUCOSE, CAPILLARY
Glucose-Capillary: 157 mg/dL — ABNORMAL HIGH (ref 70–99)
Glucose-Capillary: 226 mg/dL — ABNORMAL HIGH (ref 70–99)
Glucose-Capillary: 241 mg/dL — ABNORMAL HIGH (ref 70–99)
Glucose-Capillary: 139 mg/dL — ABNORMAL HIGH (ref 70–99)

## 2019-03-11 LAB — VITAMIN D 25 HYDROXY (VIT D DEFICIENCY, FRACTURES): Vit D, 25-Hydroxy: 32.85 ng/mL (ref 30–100)

## 2019-03-11 MED ORDER — CHLORHEXIDINE GLUCONATE CLOTH 2 % EX PADS
6.0000 | MEDICATED_PAD | Freq: Every day | CUTANEOUS | Status: DC
  Administered 2019-03-12 – 2019-03-23 (×7): 6 via TOPICAL

## 2019-03-11 MED ORDER — TRAMADOL HCL 50 MG PO TABS
25.0000 mg | ORAL_TABLET | Freq: Three times a day (TID) | ORAL | Status: DC | PRN
  Administered 2019-03-11 – 2019-03-20 (×14): 25 mg via ORAL
  Filled 2019-03-11 (×14): qty 1

## 2019-03-11 MED ORDER — VITAMIN D 25 MCG (1000 UNIT) PO TABS
1000.0000 [IU] | ORAL_TABLET | Freq: Every day | ORAL | Status: DC
  Administered 2019-03-11 – 2019-03-16 (×6): 1000 [IU] via ORAL
  Filled 2019-03-11 (×6): qty 1

## 2019-03-11 NOTE — Progress Notes (Addendum)
Subjective:  Remains on dobutamine and on lasix 160 q 8.  2400 UOP, was an improvement.   She does not appear uremic but not sleeping well, has aches in legs-  Is very alert--- BUN up to 140's yest, 150 today-  Had more serious discussion today re dialysis    Objective Vital signs in last 24 hours: Vitals:   03/10/19 2200 03/10/19 2300 03/11/19 0316 03/11/19 0408  BP: 109/68  (!) 100/59   Pulse:   (!) 59   Resp:   19   Temp:  97.8 F (36.6 C) 97.6 F (36.4 C)   TempSrc:  Oral Oral   SpO2:   97%   Weight:    87.7 kg  Height:       Weight change: -1.1 kg  Intake/Output Summary (Last 24 hours) at 03/11/2019 0725 Last data filed at 03/11/2019 0408 Gross per 24 hour  Intake 483 ml  Output 2400 ml  Net -1917 ml    Assessment/ Plan: Pt is a 59 y.o. yo female with nonischemic cardiomyopathy/MGUS/DM and baseline CKD (crt 3's)  who was admitted on 03/07/2019 with massive volume overload and crt 5  Assessment/Plan: 1. Renal-  A on CRF.  Had advanced CKD at baseline. Suspect cardiorenal vs DM.  Has M spike so that also raises the issue of a plasma cell related nephropathy- no biopsy.  Per cards-  Heart does not look like amyloid.  Onc has seen, possibly considering bone marrow biopsy.   Now with some acute change vs progression in the setting of massive volume overload.  Definitely has the pathology of cardiorenal process.  Right now numbers are not good and she remains relatively hypotensive.   Attempting a trial of dobutamine and high dose lasix.   She is diuresing but BUN rising---150 today.  I would feel better if we have a plan.  Discussed with her the possibility of placing line and starting dialysis on 1/15 as I think she will need-  Still unclear if she will remain HD dep.  She clinically looks so good that I want to try and intermittent HD , not CRRT.  Will consult IR for line-  May need to be non tunneled due to high INR.  Pt wanted me to talk to her husband, I attempted but line busy times  2.    2. Vol/htn-  Very volume overloaded and hypotensive-  Attempting dobutamine and lasix-  Able to achieve diuresis so far but have a ways to go 3. Anemia- not a major issue at this point which argues against myeloma as does low calcium 4. Secondary hyperparathyroidism- is on calcitriol-  Will cont - phos 7.2-  Should come down some with HD 5. Hyperkalemia-  Improved with lasix-   6. Hyponatremia-  Likely hypervolemic hyponatremia-  Improving slowly   Louis Meckel    Labs: Basic Metabolic Panel: Recent Labs  Lab 03/09/19 0228 03/09/19 1558 03/09/19 1559 03/10/19 0806 03/11/19 0628  NA 127*   < > 128* 127* 128*  K 5.2*   < > 4.6 4.6 4.3  CL 93*  --   --  95* 93*  CO2 18*  --   --  17* 16*  GLUCOSE 201*  --   --  169* 159*  BUN 129*  --   --  142* 150*  CREATININE 5.09*  --   --  5.11* 5.07*  CALCIUM 7.9*  --   --  6.9* 6.9*  PHOS  --   --   --  7.4* 7.2*   < > = values in this interval not displayed.   Liver Function Tests: Recent Labs  Lab 03/07/19 2101 03/08/19 0927 03/10/19 0806  AST 23 24  --   ALT 17 16  --   ALKPHOS 324* 303*  --   BILITOT 3.0* 3.3*  --   PROT 7.7 7.3  --   ALBUMIN 2.9* 2.7* 2.2*   No results for input(s): LIPASE, AMYLASE in the last 168 hours. No results for input(s): AMMONIA in the last 168 hours. CBC: Recent Labs  Lab 03/07/19 2101 03/07/19 2101 03/08/19 0927 03/08/19 0927 03/09/19 0228 03/09/19 1558 03/09/19 1559 03/10/19 0806 03/11/19 0628  WBC 6.8   < > 7.2   < > 7.5  --   --  6.3 6.8  NEUTROABS 5.5   < >  --   --  6.2  --   --  5.0 5.5  HGB 10.9*   < > 10.8*   < > 10.3*   < > 11.2* 9.8* 10.1*  HCT 33.3*   < > 32.8*   < > 30.1*   < > 33.0* 29.1* 29.6*  MCV 84.9  --  83.7  --  80.9  --   --  80.8 80.7  PLT 139*   < > 123*   < > 133*  --   --  127* 139*   < > = values in this interval not displayed.   Cardiac Enzymes: No results for input(s): CKTOTAL, CKMB, CKMBINDEX, TROPONINI in the last 168  hours. CBG: Recent Labs  Lab 03/10/19 0622 03/10/19 1111 03/10/19 1626 03/10/19 2130 03/11/19 0605  GLUCAP 162* 161* 165* 145* 157*    Iron Studies:  Recent Labs    03/10/19 0806  IRON 29  TIBC 217*  FERRITIN 253   Studies/Results: CARDIAC CATHETERIZATION  Result Date: 03/09/2019 1. Right > left heart failure with low PAPI. 2. Pulmonary venous hypertension. 3. Adequate cardiac output on dobutamine 2.5 mcg/kg/min  ECHOCARDIOGRAM COMPLETE  Result Date: 03/09/2019   ECHOCARDIOGRAM REPORT   Patient Name:   [name] Date of Exam: 03/09/2019 Medical Rec #:  [id number]       Height:       66.0 in Accession #:    [id number]      Weight:       198.4 lb Date of Birth:  [birth year]        BSA:          1.99 m Patient Age:    41 years        BP:           99/67 mmHg Patient Gender: F               HR:           60 bpm. Exam Location:  Inpatient Procedure: 2D Echo and Intracardiac Opacification Agent Indications:    CHF-Acute Systolic 323.55 / D32.20  History:        Patient has prior history of Echocardiogram examinations, most                 recent 04/10/2017. CHF, Biventricular ICD, Stroke;                 Arrythmias:Atrial Fibrillation. Chronic kidney disease.                 Congenital heart block. GERD.  Sonographer:    [name] RDCS Referring Phys: [place]  1. Left ventricular ejection fraction, by visual estimation, is <20%. The left ventricle has severely decreased function. There is no left ventricular hypertrophy.  2. Definity contrast agent was given IV to delineate the left ventricular endocardial borders.  3. Abnormal septal motion consistent with left bundle branch block.  4. Left ventricular diastolic parameters are consistent with Grade III diastolic dysfunction (restrictive).  5. Moderately dilated left ventricular internal cavity size.  6. The left ventricle demonstrates global hypokinesis.  7. Global right ventricle has mildly reduced systolic  function.The right ventricular size is severely enlarged. No increase in right ventricular wall thickness.  8. Left atrial size was severely dilated.  9. Right atrial size was severely dilated. 10. Trivial pericardial effusion is present. 11. The mitral valve is normal in structure. No evidence of mitral valve regurgitation. 12. The tricuspid valve is normal in structure. 13. The aortic valve is normal in structure. Aortic valve regurgitation is not visualized. Mild aortic valve sclerosis without stenosis. 14. Pulmonic regurgitation is moderate. 15. The pulmonic valve was normal in structure. Pulmonic valve regurgitation is moderate. 16. Mildly dilated pulmonary artery. 17. Mildly elevated pulmonary artery systolic pressure. 18. The tricuspid regurgitant velocity is 2.32 m/s, and with an assumed right atrial pressure of 15 mmHg, the estimated right ventricular systolic pressure is mildly elevated at 36.6 mmHg. 19. A pacer wire is visualized. 20. The inferior vena cava is dilated in size with <50% respiratory variability, suggesting right atrial pressure of 15 mmHg. 21. No intracardiac thrombi or masses were visualized. 22. No significant change from prior study (April 10, 2017). 23. Prior images reviewed side by side. FINDINGS  Left Ventricle: Left ventricular ejection fraction, by visual estimation, is <20%. The left ventricle has severely decreased function. Definity contrast agent was given IV to delineate the left ventricular endocardial borders. The left ventricle demonstrates global hypokinesis. The left ventricular internal cavity size was moderately dilated left ventricle. There is no left ventricular hypertrophy. Abnormal (paradoxical) septal motion, consistent with left bundle branch block. The left ventricular diastology could not be evaluated due to atrial fibrillation. Left ventricular diastolic parameters are consistent with Grade III diastolic dysfunction (restrictive). Right Ventricle: The right  ventricular size is severely enlarged. No increase in right ventricular wall thickness. Global RV systolic function is has mildly reduced systolic function. The tricuspid regurgitant velocity is 2.32 m/s, and with an assumed right atrial pressure of 15 mmHg, the estimated right ventricular systolic pressure is mildly elevated at 36.6 mmHg. Left Atrium: Left atrial size was severely dilated. Right Atrium: Right atrial size was severely dilated Pericardium: Trivial pericardial effusion is present. Mitral Valve: The mitral valve is normal in structure. No evidence of mitral valve regurgitation, with centrally-directed jet. Tricuspid Valve: The tricuspid valve is normal in structure. Tricuspid valve regurgitation is severe. Aortic Valve: The aortic valve is normal in structure. Aortic valve regurgitation is not visualized. Mild aortic valve sclerosis is present, with no evidence of aortic valve stenosis. Pulmonic Valve: The pulmonic valve was normal in structure. Pulmonic valve regurgitation is moderate. Pulmonic regurgitation is moderate. Aorta: The aortic root and ascending aorta are structurally normal, with no evidence of dilitation. Pulmonary Artery: The pulmonary artery is mildly dilated. Venous: The inferior vena cava is dilated in size with less than 50% respiratory variability, suggesting right atrial pressure of 15 mmHg. IAS/Shunts: No atrial level shunt detected by color flow Doppler. Additional Comments: No intracardiac thrombi or masses were visualized. A pacer wire is visualized.  LEFT VENTRICLE PLAX 2D LVIDd:  6.10 cm       Diastology LVIDs:         5.20 cm       LV e' lateral:   5.76 cm/s LV PW:         1.00 cm       LV E/e' lateral: 18.0 LV IVS:        1.00 cm       LV e' medial:    4.95 cm/s LVOT diam:     1.80 cm       LV E/e' medial:  21.0 LV SV:         57 ml LV SV Index:   27.63 LVOT Area:     2.54 cm  LV Volumes (MOD) LV area d, A2C:    40.10 cm LV area d, A4C:    46.10 cm LV area s,  A2C:    36.70 cm LV area s, A4C:    40.70 cm LV major d, A2C:   8.43 cm LV major d, A4C:   8.80 cm LV major s, A2C:   8.23 cm LV major s, A4C:   8.19 cm LV vol d, MOD A2C: 159.0 ml LV vol d, MOD A4C: 201.0 ml LV vol s, MOD A2C: 139.0 ml LV vol s, MOD A4C: 162.0 ml LV SV MOD A2C:     20.0 ml LV SV MOD A4C:     201.0 ml LV SV MOD BP:      32.6 ml RIGHT VENTRICLE RV Basal diam:  5.61 cm RV S prime:     11.40 cm/s TAPSE (M-mode): 1.8 cm LEFT ATRIUM              Index       RIGHT ATRIUM           Index LA diam:        4.40 cm  2.21 cm/m  RA Area:     32.60 cm LA Vol (A2C):   168.0 ml 84.29 ml/m RA Volume:   128.00 ml 64.22 ml/m LA Vol (A4C):   161.0 ml 80.78 ml/m LA Biplane Vol: 174.0 ml 87.30 ml/m  AORTIC VALVE LVOT Vmax:   120.00 cm/s LVOT Vmean:  69.500 cm/s LVOT VTI:    0.170 m  AORTA Ao Root diam: 2.40 cm Ao Asc diam:  3.30 cm MITRAL VALVE                TRICUSPID VALVE MV Area (PHT): 5.97 cm     TR Peak grad:   21.6 mmHg MV PHT:        36.83 msec   TR Vmax:        286.00 cm/s MV Decel Time: 127 msec MV E velocity: 104.00 cm/s  SHUNTS                             Systemic VTI:  0.17 m                             Systemic Diam: 1.80 cm  Dani Gobble Croitoru MD Electronically signed by Sanda Klein MD Signature Date/Time: 03/09/2019/3:16:38 PM    Final    Medications: Infusions: . sodium chloride Stopped (03/09/19 0034)  . DOBUTamine 4 mcg/kg/min (03/09/19 0839)  . furosemide 160 mg (03/10/19 2327)    Scheduled Medications: . calcitRIOL  0.5 mcg Oral Daily  . insulin aspart  0-9  Units Subcutaneous TID WC  . sodium chloride flush  3 mL Intravenous Q12H  . sodium chloride flush  3 mL Intravenous Q12H  . vitamin B-12  100 mcg Oral Daily    have reviewed scheduled and prn medications.  Physical Exam: General: alert, pleasant-  NAD- no sxms of uremia Heart: RRR Lungs: dec BS at bases Abdomen: soft, non tender Extremities: pitting edema throughout    03/11/2019,7:25 AM  LOS: 3 days

## 2019-03-11 NOTE — Consult Note (Signed)
Patient Status: [facility] - In-pt  Assessment and Plan:  59 y/o F with history of advanced CKD now with AKI requiring HD - request has been made to IR for HD catheter placement.   Patient previously on Warfarin which has been held since admission- INR 2.4 today, will recheck INR tomorrow morning and if <2.0 will proceed with tunneled HD catheter placement. If INR >2.0 will likely proceed with temporary HD catheter placement and plan for conversion to [facility] later on. This plan was discussed with patient and her husband today who state understanding and are in agreement.  Patient to be NPO after midnight, continue to hold anticoagulation until post procedure, will follow up on AM INR tomorrow.   ______________________________________________________________________   History of Present Illness: [name] is a 59 y.o. female with past medical history significant for MGUS, thrombocytopenia, a.fib on Warfarin, CHB with pacemaker, nonischemic cardiomyopathy, cirrhosis and CKD IV who presented to [facility] ED on 03/06/18 due to worsening lower extremity edema and 30+ lb weight gain. She was found to have creatinine >5, diffuse anasarca and new oxygen requirement. She is currently been followed by nephrology and decision has been made to proceed with HD. IR has been asked to place a catheter for HD access.  [name] was seen in her room with her husband at bedside - she reports pain all over and that she is very tired. She is glad that the procedure will not be until tomorrow so she can get some rest. She states it's hard to sleep because her legs/feet are so swollen. They are both in agreement to proceed with HD although [name] prefers that her husband make the final decision and sign any necessary paperwork.   Allergies and medications reviewed.   Review of Systems: A 12 point ROS discussed and pertinent positives are indicated in the HPI above.  All other systems are negative.  Review of  Systems  Constitutional: Positive for appetite change and fatigue. Negative for chills and fever.  Respiratory: Positive for shortness of breath. Negative for cough.   Cardiovascular: Positive for leg swelling. Negative for chest pain.  Gastrointestinal: Positive for abdominal pain and nausea. Negative for diarrhea and vomiting.  Musculoskeletal: Positive for back pain.  Skin: Negative for wound.  Neurological: Positive for headaches. Negative for dizziness.    Vital Signs: BP (!) 97/58 (BP Location: Left Arm)   Pulse (!) 59   Temp 97.8 F (36.6 C) (Oral)   Resp 20   Ht 5\' 6"  (1.676 m)   Wt 193 lb 5.5 oz (87.7 kg)   SpO2 98%   BMI 31.21 kg/m   Physical Exam Vitals and nursing note reviewed.  Constitutional:      General: She is not in acute distress.    Appearance: She is ill-appearing.  HENT:     Head: Normocephalic.     Mouth/Throat:     Mouth: Mucous membranes are moist.     Pharynx: Oropharynx is clear. No oropharyngeal exudate or posterior oropharyngeal erythema.  Cardiovascular:     Rate and Rhythm: Normal rate and regular rhythm.  Pulmonary:     Effort: Pulmonary effort is normal.     Breath sounds: Normal breath sounds.  Abdominal:     General: There is no distension.     Palpations: Abdomen is soft.     Tenderness: There is no abdominal tenderness.  Skin:    General: Skin is warm and dry.  Neurological:     Mental  Status: She is alert and oriented to person, place, and time.  Psychiatric:        Mood and Affect: Mood normal.        Behavior: Behavior normal.        Thought Content: Thought content normal.        Judgment: Judgment normal.      Imaging reviewed.   Labs:  COAGS: Recent Labs    11/30/18 0610 12/03/18 1219 03/08/19 0927 03/09/19 0228 03/10/19 0806 03/11/19 0628  INR 4.1*   < > 3.1* 3.1* 2.8* 2.4*  APTT 44*  --   --   --   --   --    < > = values in this interval not displayed.    BMP: Recent Labs    03/08/19 0927  03/08/19 0927 03/09/19 0228 03/09/19 0228 03/09/19 1558 03/09/19 1559 03/10/19 0806 03/11/19 0628  NA 126*   < > 127*   < > 129* 128* 127* 128*  K 5.2*   < > 5.2*   < > 4.5 4.6 4.6 4.3  CL 93*  --  93*  --   --   --  95* 93*  CO2 17*  --  18*  --   --   --  17* 16*  GLUCOSE 49*  --  201*  --   --   --  169* 159*  BUN 120*  --  129*  --   --   --  142* 150*  CALCIUM 7.9*  --  7.9*  --   --   --  6.9* 6.9*  CREATININE 4.97*  --  5.09*  --   --   --  5.11* 5.07*  GFRNONAA 9*  --  9*  --   --   --  9* 9*  GFRAA 10*  --  10*  --   --   --  10* 10*   < > = values in this interval not displayed.       Electronically Signed: Joaquim Nam, PA-C 03/11/2019, 11:20 AM   I spent a total of 15 minutes in face to face in clinical consultation, greater than 50% of which was counseling/coordinating care for venous access.

## 2019-03-11 NOTE — Progress Notes (Signed)
Patient ID: [name], female   DOB: 1960-08-27, 59 y.o.   MRN: [id number]     Advanced Heart Failure Rounding Note  PCP-Cardiologist: Kate Sable, MD   Subjective:    Dobutamine 2.5 started empirically on 1/11.  She is getting high dose IV Lasix and had about 2625 cc total UOP yesterday, somewhat better than the day before.   BUN/creatinine 120/4.97 => 129/5.09 => 150/5.07.     Main complaint is leg pain bilaterally, she thinks this is from swelling.  No dyspnea. No obvious confusion.    Echo: EF < 20%, no LVH, mildly reduced RV systolic function, severe biatrial enlargement, severe TR.  RHC Procedural Findings (dobutamine 2.5 mcg/kg/min): Hemodynamics (mmHg) RA 18 RV 43/18 PA 45/19, mean 28 PCWP mean 19 Oxygen saturations: PA 63% AO 94% Cardiac Output (Fick) 6.1  Cardiac Index (Fick) 3.06 PVR 1.5 WU PAPI 1.4   Objective:   Weight Range: 87.7 kg Body mass index is 31.21 kg/m.   Vital Signs:   Temp:  [97.3 F (36.3 C)-97.9 F (36.6 C)] 97.8 F (36.6 C) (01/14 0744) Pulse Rate:  [59-60] 60 (01/14 0821) Resp:  [19-22] 20 (01/14 0821) BP: (95-109)/(58-68) 97/58 (01/14 0744) SpO2:  [96 %-98 %] 98 % (01/14 0744) Weight:  [87.7 kg] 87.7 kg (01/14 0408) Last BM Date: 03/08/19  Weight change: Filed Weights   03/09/19 0500 03/10/19 0248 03/11/19 0408  Weight: 90 kg 88.8 kg 87.7 kg    Intake/Output:   Intake/Output Summary (Last 24 hours) at 03/11/2019 0826 Last data filed at 03/11/2019 0754 Gross per 24 hour  Intake 423 ml  Output 2625 ml  Net -2202 ml      Physical Exam    General: NAD Neck: JVP 16 cm, no thyromegaly or thyroid nodule.  Lungs: Clear to auscultation bilaterally with normal respiratory effort. CV: Lateral PMI.  Heart regular S1/S2, +S3, 3/6 HSM LLSB.  1+ edema to knees.   Abdomen: Soft, nontender, no hepatosplenomegaly, no distention.  Skin: Intact without lesions or rashes.  Neurologic: Alert and oriented x 3.  Psych: Normal  affect. Extremities: No clubbing or cyanosis.  HEENT: Normal.     Telemetry   Atrial fibrillation with BiV pacing, rate 60s (personally reviewed)  Labs    CBC Recent Labs    03/10/19 0806 03/11/19 0628  WBC 6.3 6.8  NEUTROABS 5.0 5.5  HGB 9.8* 10.1*  HCT 29.1* 29.6*  MCV 80.8 80.7  PLT 127* 409*   Basic Metabolic Panel Recent Labs    03/10/19 0806 03/11/19 0628  NA 127* 128*  K 4.6 4.3  CL 95* 93*  CO2 17* 16*  GLUCOSE 169* 159*  BUN 142* 150*  CREATININE 5.11* 5.07*  CALCIUM 6.9* 6.9*  PHOS 7.4* 7.2*   Liver Function Tests Recent Labs    03/08/19 0927 03/08/19 0927 03/10/19 0806 03/11/19 0628  AST 24  --   --   --   ALT 16  --   --   --   ALKPHOS 303*  --   --   --   BILITOT 3.3*  --   --   --   PROT 7.3  --   --   --   ALBUMIN 2.7*   < > 2.2* 2.3*   < > = values in this interval not displayed.   No results for input(s): LIPASE, AMYLASE in the last 72 hours. Cardiac Enzymes No results for input(s): CKTOTAL, CKMB, CKMBINDEX, TROPONINI in the last 72 hours.  BNP: BNP (last 3 results) Recent Labs    03/07/19 2101  BNP 1,072.0*    ProBNP (last 3 results) No results for input(s): PROBNP in the last 8760 hours.   D-Dimer No results for input(s): DDIMER in the last 72 hours. Hemoglobin A1C No results for input(s): HGBA1C in the last 72 hours. Fasting Lipid Panel No results for input(s): CHOL, HDL, LDLCALC, TRIG, CHOLHDL, LDLDIRECT in the last 72 hours. Thyroid Function Tests No results for input(s): TSH, T4TOTAL, T3FREE, THYROIDAB in the last 72 hours.  Invalid input(s): FREET3  Other results:   Imaging    No results found.   Medications:     Scheduled Medications: . calcitRIOL  0.5 mcg Oral Daily  . insulin aspart  0-9 Units Subcutaneous TID WC  . sodium chloride flush  3 mL Intravenous Q12H  . sodium chloride flush  3 mL Intravenous Q12H  . vitamin B-12  100 mcg Oral Daily    Infusions: . sodium chloride Stopped  (03/09/19 0034)  . DOBUTamine 4 mcg/kg/min (03/09/19 0839)  . furosemide 66 mL/hr at 03/11/19 0700    PRN Medications: sodium chloride, acetaminophen **OR** acetaminophen, ondansetron **OR** ondansetron (ZOFRAN) IV, sodium chloride flush    Assessment/Plan   1. Acute on chronic systolic CHF: Longstanding NICM.  Last echo in 2/19 with EF 15-20%. She has a [employer] CRT-D device. Admitted with progressive dyspnea and 30 lb weight gain. She is very volume overloaded on exam, complicated by SBP in 21H and AKI on CKD stage 4. Empirically started on dobutamine 2.5 due to concern for low output. RHC showed R>L heart failure with low PAPI adequate cardiac output on dobutamine.  She has had reasonable UOP on high dose Lasix but remains volume overloaded and BUN now markedly higher.  - Continue dobutamine 2.5 mcg/kg/min.   - Lasix 160 mg IV every 8 hrs to continue.  - Stopped digoxin and benazepril with AKI.  - Stopped beta blocker with concern for low output.  - No BP room for hydralazine/nitrates.  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  2. AKI on CKD stage 4: Baseline creatinine around 3, now up to 5 with BUN 150.  Not overtly uremic this morning (no confusion).  Suspect cardiorenal syndrome superimposed on possible diabetic nephropathy.  She is unfortunately very volume overloaded as well.  Some response to high dose IV Lasix boluses.  - Continue high dose IV Lasix + dobutamine to support CO.   - With ongoing BUN rise, suspect she will need HD.  Per nephrology (possible line placement with intermittent HD to start tomorrow).  - Consideration for renal biopsy given monoclonal gammopathy and renal failure.  3. Chronic atrial fibrillation: Warfarin held for procedures, INR 2.4.  Can cover with heparin gtt when INR < 2.  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  5. DM: Per primary.  6. Monoclonal gammopathy:  LV morphology does not  look concerning for AL amyloidosis. She has been seen by hematology, possible bone marrow biopsy in future.  7. Tricuspid regurgitation: Severe by echo.   Length of Stay: 3  Loralie Champagne, MD  03/11/2019, 8:26 AM  Advanced Heart Failure Team Pager [phone] (M-F; 7a - 4p)  Please contact [facility] for night-coverage after hours (4p -7a ) and weekends on [email or web address]

## 2019-03-11 NOTE — Plan of Care (Signed)
  Problem: Education: Goal: Ability to demonstrate management of disease process will improve Outcome: Progressing Goal: Ability to verbalize understanding of medication therapies will improve Outcome: Progressing Goal: Individualized Educational Video(s) Outcome: Progressing   Problem: Activity: Goal: Capacity to carry out activities will improve Outcome: Progressing   Problem: Cardiac: Goal: Ability to achieve and maintain adequate cardiopulmonary perfusion will improve Outcome: Progressing   Problem: Education: Goal: Knowledge of General Education information will improve Description: Including pain rating scale, medication(s)/side effects and non-pharmacologic comfort measures Outcome: Progressing   Problem: Health Behavior/Discharge Planning: Goal: Ability to manage health-related needs will improve Outcome: Progressing   Problem: Clinical Measurements: Goal: Ability to maintain clinical measurements within normal limits will improve Outcome: Progressing Goal: Will remain free from infection Outcome: Progressing Goal: Diagnostic test results will improve Outcome: Progressing Goal: Respiratory complications will improve Outcome: Progressing Goal: Cardiovascular complication will be avoided Outcome: Progressing   Problem: Activity: Goal: Risk for activity intolerance will decrease Outcome: Progressing   Problem: Nutrition: Goal: Adequate nutrition will be maintained Outcome: Progressing   Problem: Coping: Goal: Level of anxiety will decrease Outcome: Progressing   Problem: Elimination: Goal: Will not experience complications related to bowel motility Outcome: Progressing Goal: Will not experience complications related to urinary retention Outcome: Progressing   Problem: Pain Managment: Goal: General experience of comfort will improve Outcome: Progressing   Problem: Safety: Goal: Ability to remain free from injury will improve Outcome: Progressing    Problem: Skin Integrity: Goal: Risk for impaired skin integrity will decrease Outcome: Progressing   Problem: Education: Goal: Understanding of CV disease, CV risk reduction, and recovery process will improve Outcome: Progressing Goal: Individualized Educational Video(s) Outcome: Progressing   Problem: Activity: Goal: Ability to return to baseline activity level will improve Outcome: Progressing   Problem: Cardiovascular: Goal: Ability to achieve and maintain adequate cardiovascular perfusion will improve Outcome: Progressing Goal: Vascular access site(s) Level 0-1 will be maintained Outcome: Progressing   Problem: Cardiovascular: Goal: Vascular access site(s) Level 0-1 will be maintained Outcome: Progressing   Problem: Health Behavior/Discharge Planning: Goal: Ability to safely manage health-related needs after discharge will improve Outcome: Progressing

## 2019-03-11 NOTE — Progress Notes (Signed)
ANTICOAGULATION CONSULT NOTE - [place] for Coumadin to heparin Indication: atrial fibrillation  Allergies  Allergen Reactions  . Wheat Swelling  . Latex Rash  . Penicillins Rash  . Sulfa Antibiotics Rash    Patient Measurements: Height: 5\' 6"  (167.6 cm) Weight: 193 lb 5.5 oz (87.7 kg) IBW/kg (Calculated) : 59.3  Vital Signs: Temp: 97.8 F (36.6 C) (01/14 0744) Temp Source: Oral (01/14 0744) BP: 97/58 (01/14 0744) Pulse Rate: 59 (01/14 1000)  Labs: Recent Labs    03/09/19 0228 03/09/19 1558 03/09/19 1559 03/09/19 1559 03/10/19 0806 03/11/19 0628  HGB 10.3*   < > 11.2*   < > 9.8* 10.1*  HCT 30.1*   < > 33.0*  --  29.1* 29.6*  PLT 133*  --   --   --  127* 139*  LABPROT 32.1*  --   --   --  29.5* 26.0*  INR 3.1*  --   --   --  2.8* 2.4*  CREATININE 5.09*  --   --   --  5.11* 5.07*   < > = values in this interval not displayed.    Estimated Creatinine Clearance: 13.3 mL/min (A) (by C-G formula based on SCr of 5.07 mg/dL (H)).   Medical History: Past Medical History:  Diagnosis Date  . Automatic implantable cardioverter-defibrillator in situ    a. 03/2013: BSX Energen CRTD BiV ICC, ser# [id number]  . Chronic anticoagulation    a. coumadin.  . Chronic atrial fibrillation (HCC)    embolic rind  . Chronic kidney disease    creatinine- 1.8 in 09/2006 and 2.0 in 05/2007; 2.05 in 2011, 2.29 in 2012  . Chronic systolic CHF (congestive heart failure) ([place])    a. 03/2013 Echo: Sev LV dysfxn with sev diff HK, restrictive phys, diast dysfxn, mild MR, sev dil LA/RA, mod PR, PASP 68mmHg.  . Congenital third degree heart block    a. Guidant VVI pacemaker implanted in 09/1997; b. gen change in 01/2004;  c. 03/2013 upgrade to [place], ser# [id number].  . Dizziness and giddiness 05/19/2012  . Dysphagia 08/14/2011   FEB 2013 EGD/DIL 16 MM; GERD; h/o gastroesophageal reflux disease; + H. Pylori gastritis   . GERD (gastroesophageal reflux disease)    . Helicobacter pylori gastritis 08/14/2011  . IDDM (insulin dependent diabetes mellitus)   . Kidney stones 1990's  . Nonischemic cardiomyopathy ([place])    a. 03/2013 Echo: Sev LV dysfxn with sev diff HK, restrictive phys, diast dysfxn, mild MR, sev dil LA/RA, mod PR, PASP 44mmHg;  b. 03/2013: BSX Energen CRTD BiV ICC, ser# [id number].  Marland Kitchen Potassium (K) excess   . Stroke [facility]) 1999   denies residual on 04/21/2013     Assessment: 60 yoF on warfarin PTA for hx AFib admitted with acute CHF. INR 2.4, H/H stable - may need HD lines, planning for heparin drip once INR < 2.   Goal of Therapy:  INR 2-3 Monitor platelets by anticoagulation protocol: Yes   Plan:  -Hold warfarin - may need procedures/lines -Follow protime, start heparin once < 2   Arrie Senate, PharmD, BCPS Clinical Pharmacist [phone] Please check AMION for all [facility] numbers 03/11/2019

## 2019-03-11 NOTE — Progress Notes (Signed)
PROGRESS NOTE    [name]  WGN:[id number] DOB: Jul 12, 1960 DOA: 03/07/2019 PCP: [name], MD   Brief Narrative: 59 year old with past medical history significant for MGUS, thrombocytopenia, A. fib on Coumadin, CHB with pacemaker, nonischemic cardiomyopathy with ejection fraction 15 to 20% with ICD, CKD stage IV baseline creatinine 3.0, cirrhosis who presented with several weeks of progressive swelling about 30 pound weight gain.  Evaluation in the ED patient was found to have a creatinine more than 5, anasarca, hemoglobin of 11.  Chest x-ray was clear but she required 2 L of supplemental oxygen.  Patient received 40 mg of IV Lasix without significant urine output.  Patient was transferred to [facility] for heart failure team evaluation, patient was a started on inotrope.    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 Started on dobutamine drip. Continue with IV [place] 160 mg TID>  Urine Out put 2.6  L yesterday.  Plan to continue with current management  Cath on 03-10-2019; PA 45/19.  ECHO Ef 20 % , Severe TR.  Plan to proceed with IV access placement for HD , plan to start HD 115 for ultrafiltration, BUN more than 150.   2-Acute on chronic renal failure stage IV: Prior creatinine 2.9--3.5 last year. Considering cardiorenal syndrome. Nephrology following for need of CRRT.  Plan to proceed with IV access placement for HD, plan to start HD 115 for ultrafiltration, BUN more than 150.   3-Anemia; MGUS with Thrombocytopenia;  Follow up with Dr Delton Coombes.  M spike 22/28.  Kappa free light chain 1392. B 2 microglobulin 9.6.  electrophoresis protein and kappa/lamda ratio : 43, M spike 1.2.  Appreciate Oncology assistance.    4-Chronic A fib; Continue with Coumadin.   5-Diabetes; SSI for now due to hypoglycemia.   6-Hyponatremia; related to hypervolemia.  Continue with diuresis.   7-Abdominal Firmness, CT negative for Hematoma.  Postraumatic seroma/mild Sherry Ruffing lesion, patient denies any recent trauma. Probably old injury.   8-Secondary Hyperparathyroidism; Hyperkalemia; mild on lasix. Resolved.  Hyponatremia; related to renal failure, heart failure.    Cirrhosis.     Estimated body mass index is 31.21 kg/m as calculated from the following:   Height as of this encounter: 5\' 6"  (1.676 m).   Weight as of this encounter: 87.7 kg.   DVT prophylaxis: On Coumadin Code Status: Full code Family Communication: Care discussed with patient Disposition Plan:  Remain in the hospital for management of HF, renal failure.  Consultants:   Cardiology  Nephrology   Procedures:   ECHO 20 %    Antimicrobials:  None  Subjective: Complaining of pain in her leg, BL LE edema.   Objective: Vitals:   03/11/19 0744 03/11/19 0821 03/11/19 1000 03/11/19 1146  BP: (!) 97/58   (!) 100/55  Pulse: (!) 59 60 (!) 59 62  Resp: 19 20 20 15   Temp: 97.8 F (36.6 C)   97.6 F (36.4 C)  TempSrc: Oral   Oral  SpO2: 98%   99%  Weight:      Height:  Intake/Output Summary (Last 24 hours) at 03/11/2019 1234 Last data filed at 03/11/2019 1100 Gross per 24 hour  Intake 423 ml  Output 2325 ml  Net -1902 ml   Filed Weights   03/09/19 0500 03/10/19 0248 03/11/19 0408  Weight: 90 kg 88.8 kg 87.7 kg    Examination:  General exam: NAD Respiratory system: Crackles bases.  Cardiovascular system: S 1, S 2 RRR Gastrointestinal system: BS present, Soft, nt Central nervous system: Non focal.  Extremities: Symmetric power Skin: No rashes    Data  Reviewed: I have personally reviewed following labs and imaging studies  CBC: Recent Labs  Lab 03/07/19 2101 03/07/19 2101 03/08/19 0927 03/08/19 0927 03/09/19 0228 03/09/19 1558 03/09/19 1559 03/10/19 0806 03/11/19 0628  WBC 6.8  --  7.2  --  7.5  --   --  6.3 6.8  NEUTROABS 5.5  --   --   --  6.2  --   --  5.0 5.5  HGB 10.9*   < > 10.8*   < > 10.3* 11.6* 11.2* 9.8* 10.1*  HCT 33.3*   < > 32.8*   < > 30.1* 34.0* 33.0* 29.1* 29.6*  MCV 84.9  --  83.7  --  80.9  --   --  80.8 80.7  PLT 139*  --  123*  --  133*  --   --  127* 139*   < > = values in this interval not displayed.   Basic Metabolic Panel: Recent Labs  Lab 03/07/19 2101 03/07/19 2101 03/08/19 0927 03/08/19 0927 03/09/19 0228 03/09/19 1558 03/09/19 1559 03/10/19 0806 03/11/19 0628  NA 127*   < > 126*   < > 127* 129* 128* 127* 128*  K 5.3*   < > 5.2*   < > 5.2* 4.5 4.6 4.6 4.3  CL 94*  --  93*  --  93*  --   --  95* 93*  CO2 20*  --  17*  --  18*  --   --  17* 16*  GLUCOSE 77  --  49*  --  201*  --   --  169* 159*  BUN 118*  --  120*  --  129*  --   --  142* 150*  CREATININE 5.04*  --  4.97*  --  5.09*  --   --  5.11* 5.07*  CALCIUM 8.4*  --  7.9*  --  7.9*  --   --  6.9* 6.9*  PHOS  --   --   --   --   --   --   --  7.4* 7.2*   < > = values in this interval not displayed.   GFR: Estimated Creatinine Clearance: 13.3 mL/min (A) (by C-G formula based on SCr of 5.07 mg/dL (H)). Liver Function Tests: Recent Labs  Lab 03/07/19 2101 03/08/19 0927 03/10/19 0806 03/11/19 0628  AST 23 24  --   --   ALT 17 16  --   --   ALKPHOS 324* 303*  --   --   BILITOT 3.0* 3.3*  --   --   PROT 7.7 7.3  --   --   ALBUMIN 2.9* 2.7* 2.2* 2.3*   No results for input(s): LIPASE, AMYLASE in the last 168 hours. No results for input(s): AMMONIA in the last 168 hours. Coagulation Profile: Recent Labs  Lab 03/07/19 2101 03/08/19 0927 03/09/19 0228 03/10/19 0806 03/11/19 0628  INR 3.0* 3.1* 3.1* 2.8* 2.4*   Cardiac  Enzymes: No results for input(s): CKTOTAL, CKMB, CKMBINDEX, TROPONINI in the last 168 hours. BNP (last 3 results) No results for input(s): PROBNP in the last 8760 hours. HbA1C: No results for input(s): HGBA1C in the last 72 hours. CBG: Recent Labs  Lab 03/10/19 1111 03/10/19 1626 03/10/19 2130 03/11/19 0605 03/11/19 1154  GLUCAP 161* 165* 145* 157* 226*   Lipid Profile: No results for input(s): CHOL, HDL, LDLCALC, TRIG, CHOLHDL, LDLDIRECT in the last 72 hours. Thyroid Function Tests: No results for input(s): TSH, T4TOTAL, FREET4, T3FREE, THYROIDAB in the last 72 hours. Anemia Panel: Recent Labs    03/10/19 0806  FERRITIN 253  TIBC 217*  IRON 29   Sepsis Labs: No results for input(s): PROCALCITON, LATICACIDVEN in the last 168 hours.  Recent Results (from the past 240 hour(s))  Respiratory Panel by RT PCR (Flu A&B, Covid) - Nasopharyngeal Swab     Status: None   Collection Time: 03/07/19  9:21 PM   Specimen: Nasopharyngeal Swab  Result Value Ref Range Status   SARS Coronavirus 2 by RT PCR NEGATIVE NEGATIVE Final    Comment: (NOTE) SARS-CoV-2 target nucleic acids are NOT DETECTED. The SARS-CoV-2 RNA is generally detectable in upper respiratoy specimens during the acute phase of infection. The lowest concentration of SARS-CoV-2 viral copies this assay can detect is 131 copies/mL. A negative result does not preclude SARS-Cov-2 infection and should not be used as the sole basis for treatment or other patient management decisions. A negative result may occur with  improper specimen collection/handling, submission of specimen other than nasopharyngeal swab, presence of viral mutation(s) within the areas targeted by this assay, and inadequate number of viral copies (<131 copies/mL). A negative result must be combined with clinical observations, patient history, and epidemiological information. The expected result is Negative. Fact Sheet for Patients:   PinkCheek.be Fact Sheet for Healthcare Providers:  GravelBags.it This test is not yet ap proved or cleared by the [place] FDA and  has been authorized for detection and/or diagnosis of SARS-CoV-2 by FDA under an Emergency Use Authorization (EUA). This EUA will remain  in effect (meaning this test can be used) for the duration of the COVID-19 declaration under Section 564(b)(1) of the Act, 21 U.S.C. section 360bbb-3(b)(1), unless the authorization is terminated or revoked sooner.    Influenza A by PCR NEGATIVE NEGATIVE Final   Influenza B by PCR NEGATIVE NEGATIVE Final    Comment: (NOTE) The Xpert Xpress SARS-CoV-2/FLU/RSV assay is intended as an aid in  the diagnosis of influenza from Nasopharyngeal swab specimens and  should not be used as a sole basis for treatment. Nasal washings and  aspirates are unacceptable for Xpert Xpress SARS-CoV-2/FLU/RSV  testing. Fact Sheet for Patients: PinkCheek.be Fact Sheet for Healthcare Providers: GravelBags.it This test is not yet approved or cleared by the [place] FDA and  has been authorized for detection and/or diagnosis of SARS-CoV-2 by  FDA under an Emergency Use Authorization (EUA). This EUA will remain  in effect (meaning this test can be used) for the duration of the  Covid-19 declaration under Section 564(b)(1) of the Act, 21  U.S.C. section 360bbb-3(b)(1), unless the authorization is  terminated or revoked. Performed at [facility], [address], [place], [place] [address]   MRSA PCR Screening     Status: None   Collection Time: 03/08/19 10:26 PM   Specimen: Nasopharyngeal  Result Value Ref Range Status   MRSA by PCR NEGATIVE NEGATIVE Final    Comment:  The GeneXpert MRSA Assay (FDA approved for NASAL specimens only), is one component of a comprehensive MRSA colonization surveillance program. It  is not intended to diagnose MRSA infection nor to guide or monitor treatment for MRSA infections. Performed at [facility], [place] [address] [place], [place] [address]          Radiology Studies: CARDIAC CATHETERIZATION  Result Date: 03/09/2019 1. Right > left heart failure with low PAPI. 2. Pulmonary venous hypertension. 3. Adequate cardiac output on dobutamine 2.5 mcg/kg/min       Scheduled Meds: . calcitRIOL  0.5 mcg Oral Daily  . insulin aspart  0-9 Units Subcutaneous TID WC  . sodium chloride flush  3 mL Intravenous Q12H  . sodium chloride flush  3 mL Intravenous Q12H  . vitamin B-12  100 mcg Oral Daily   Continuous Infusions: . sodium chloride Stopped (03/09/19 0034)  . DOBUTamine 4 mcg/kg/min (03/09/19 0839)  . furosemide 160 mg (03/11/19 1008)     LOS: 3 days    Time spent: 35 minutes.     Elmarie Shiley, MD [facility]   If 7PM-7AM, please contact night-coverage [email or web address] Password [facility] 03/11/2019, 12:34 PM

## 2019-03-12 ENCOUNTER — Encounter (HOSPITAL_COMMUNITY): Payer: Medicaid Other

## 2019-03-12 ENCOUNTER — Inpatient Hospital Stay (HOSPITAL_COMMUNITY): Payer: Medicaid Other

## 2019-03-12 HISTORY — PX: IR FLUORO GUIDE CV LINE RIGHT: IMG2283

## 2019-03-12 HISTORY — PX: IR US GUIDE VASC ACCESS RIGHT: IMG2390

## 2019-03-12 LAB — CBC WITH DIFFERENTIAL/PLATELET
Abs Immature Granulocytes: 0.05 10*3/uL (ref 0.00–0.07)
Basophils Absolute: 0 10*3/uL (ref 0.0–0.1)
Basophils Relative: 0 %
Eosinophils Absolute: 0.2 10*3/uL (ref 0.0–0.5)
Eosinophils Relative: 3 %
HCT: 27.6 % — ABNORMAL LOW (ref 36.0–46.0)
Hemoglobin: 9.5 g/dL — ABNORMAL LOW (ref 12.0–15.0)
Immature Granulocytes: 1 %
Lymphocytes Relative: 10 %
Lymphs Abs: 0.7 10*3/uL (ref 0.7–4.0)
MCH: 27.5 pg (ref 26.0–34.0)
MCHC: 34.4 g/dL (ref 30.0–36.0)
MCV: 80 fL (ref 80.0–100.0)
Monocytes Absolute: 0.6 10*3/uL (ref 0.1–1.0)
Monocytes Relative: 8 %
Neutro Abs: 5.7 10*3/uL (ref 1.7–7.7)
Neutrophils Relative %: 78 %
Platelets: 141 10*3/uL — ABNORMAL LOW (ref 150–400)
RBC: 3.45 MIL/uL — ABNORMAL LOW (ref 3.87–5.11)
RDW: 16.1 % — ABNORMAL HIGH (ref 11.5–15.5)
WBC: 7.2 10*3/uL (ref 4.0–10.5)
nRBC: 0.4 % — ABNORMAL HIGH (ref 0.0–0.2)

## 2019-03-12 LAB — PROTIME-INR
INR: 2.5 — ABNORMAL HIGH (ref 0.8–1.2)
Prothrombin Time: 26.9 seconds — ABNORMAL HIGH (ref 11.4–15.2)

## 2019-03-12 LAB — GLUCOSE, CAPILLARY
Glucose-Capillary: 141 mg/dL — ABNORMAL HIGH (ref 70–99)
Glucose-Capillary: 131 mg/dL — ABNORMAL HIGH (ref 70–99)
Glucose-Capillary: 137 mg/dL — ABNORMAL HIGH (ref 70–99)
Glucose-Capillary: 86 mg/dL (ref 70–99)
Glucose-Capillary: 200 mg/dL — ABNORMAL HIGH (ref 70–99)

## 2019-03-12 LAB — RENAL FUNCTION PANEL
Albumin: 2.1 g/dL — ABNORMAL LOW (ref 3.5–5.0)
Anion gap: 15 (ref 5–15)
BUN: 153 mg/dL — ABNORMAL HIGH (ref 6–20)
CO2: 19 mmol/L — ABNORMAL LOW (ref 22–32)
Calcium: 7 mg/dL — ABNORMAL LOW (ref 8.9–10.3)
Chloride: 94 mmol/L — ABNORMAL LOW (ref 98–111)
Creatinine, Ser: 5.06 mg/dL — ABNORMAL HIGH (ref 0.44–1.00)
GFR calc Af Amer: 10 mL/min — ABNORMAL LOW (ref >60)
GFR calc non Af Amer: 9 mL/min — ABNORMAL LOW (ref >60)
Glucose, Bld: 150 mg/dL — ABNORMAL HIGH (ref 70–99)
Phosphorus: 7.3 mg/dL — ABNORMAL HIGH (ref 2.5–4.6)
Potassium: 4 mmol/L (ref 3.5–5.1)
Sodium: 128 mmol/L — ABNORMAL LOW (ref 135–145)

## 2019-03-12 LAB — HEPATITIS B SURFACE ANTIGEN: Hepatitis B Surface Ag: NONREACTIVE

## 2019-03-12 MED ORDER — MIDODRINE HCL 5 MG PO TABS
10.0000 mg | ORAL_TABLET | Freq: Three times a day (TID) | ORAL | Status: DC
  Administered 2019-03-12 – 2019-03-24 (×36): 10 mg via ORAL
  Filled 2019-03-12 (×35): qty 2

## 2019-03-12 MED ORDER — PENTAFLUOROPROP-TETRAFLUOROETH EX AERO: 1.0000 "application " | INHALATION_SPRAY | CUTANEOUS | Status: DC | PRN

## 2019-03-12 MED ORDER — SODIUM CHLORIDE 0.9 % IV SOLN: 100.0000 mL | INTRAVENOUS | Status: DC | PRN

## 2019-03-12 MED ORDER — HEPARIN SODIUM (PORCINE) 1000 UNIT/ML IJ SOLN
INTRAMUSCULAR | Status: AC
  Administered 2019-03-12: 2600 [IU] via INTRAVENOUS_CENTRAL
  Filled 2019-03-12: qty 4

## 2019-03-12 MED ORDER — ALBUMIN HUMAN 25 % IV SOLN
INTRAVENOUS | Status: AC
  Administered 2019-03-12: 25 g via INTRAVENOUS
  Filled 2019-03-12: qty 100

## 2019-03-12 MED ORDER — FENTANYL CITRATE (PF) 100 MCG/2ML IJ SOLN
12.5000 ug | INTRAMUSCULAR | Status: DC | PRN
  Administered 2019-03-12 – 2019-03-16 (×4): 12.5 ug via INTRAVENOUS
  Filled 2019-03-12 (×4): qty 2

## 2019-03-12 MED ORDER — HEPARIN SODIUM (PORCINE) 1000 UNIT/ML IJ SOLN
INTRAMUSCULAR | Status: AC | PRN
  Administered 2019-03-12: 2600 [IU] via INTRAVENOUS

## 2019-03-12 MED ORDER — LIDOCAINE HCL 1 % IJ SOLN
INTRAMUSCULAR | Status: DC | PRN
  Administered 2019-03-12: 5 mL

## 2019-03-12 MED ORDER — LORAZEPAM 2 MG/ML IJ SOLN
0.2500 mg | Freq: Once | INTRAMUSCULAR | Status: AC
  Administered 2019-03-12: 08:00:00 0.25 mg via INTRAVENOUS
  Filled 2019-03-12: qty 1

## 2019-03-12 MED ORDER — LIDOCAINE HCL 1 % IJ SOLN
INTRAMUSCULAR | Status: AC
  Filled 2019-03-12: qty 20

## 2019-03-12 MED ORDER — LIDOCAINE-PRILOCAINE 2.5-2.5 % EX CREA: 1.0000 "application " | TOPICAL_CREAM | CUTANEOUS | Status: DC | PRN

## 2019-03-12 MED ORDER — LIDOCAINE HCL (PF) 1 % IJ SOLN: 5.0000 mL | INTRAMUSCULAR | Status: DC | PRN

## 2019-03-12 MED ORDER — ALTEPLASE 2 MG IJ SOLR: 2.0000 mg | Freq: Once | INTRAMUSCULAR | Status: DC | PRN

## 2019-03-12 MED ORDER — LIDOCAINE HCL (PF) 2 % IJ SOLN
10.0000 mL | Freq: Once | INTRAMUSCULAR | Status: DC
  Filled 2019-03-12: qty 10

## 2019-03-12 MED ORDER — HEPARIN SODIUM (PORCINE) 1000 UNIT/ML IJ SOLN
INTRAMUSCULAR | Status: AC
  Filled 2019-03-12: qty 1

## 2019-03-12 MED ORDER — HEPARIN SODIUM (PORCINE) 1000 UNIT/ML DIALYSIS: 1000.0000 [IU] | INTRAMUSCULAR | Status: DC | PRN

## 2019-03-12 MED ORDER — ALBUMIN HUMAN 25 % IV SOLN: 25.0000 g | Freq: Once | INTRAVENOUS | Status: AC

## 2019-03-12 NOTE — Progress Notes (Signed)
PROGRESS NOTE    [name]  FIE:[id number] DOB: 22-Aug-1960 DOA: 03/07/2019 PCP: [name], MD   Brief Narrative: 59 year old with past medical history significant for MGUS, thrombocytopenia, A. fib on Coumadin, CHB with pacemaker, nonischemic cardiomyopathy with ejection fraction 15 to 20% with ICD, CKD stage IV baseline creatinine 3.0, cirrhosis who presented with several weeks of progressive swelling about 30 pound weight gain.  Evaluation in the ED patient was found to have a creatinine more than 5, anasarca, hemoglobin of 11.  Chest x-ray was clear but she required 2 L of supplemental oxygen.  Patient received 40 mg of IV Lasix without significant urine output.  Patient was transferred to [facility] for heart failure team evaluation, patient was a started on inotrope.    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 Started on dobutamine drip. Continue with IV Laxis 160 mg TID>  Urine Out put 1  L yesterday.  Plan to continue with current management.  Cath on 03-10-2019; PA 45/19.  ECHO Ef 20 % , Severe TR.  Plan to proceed with IV access placement for HD , plan to start HD 1-15 for ultrafiltration, BUN more than 150.  Still with significant volume overload.   2-Acute on chronic renal failure stage IV: Prior creatinine 2.9--3.5 last year. Considering cardiorenal syndrome. Nephrology following for need of CRRT.  Plan to proceed with IV access placement for HD, plan to start HD 115 for ultrafiltration, BUN more than 150.   3-Anemia; MGUS with Thrombocytopenia;  Follow  up with Dr Delton Coombes.  M spike 22/28. Kappa free light chain 1392. B 2 microglobulin 9.6.  electrophoresis protein and kappa/lamda ratio : 43, M spike 1.2.  Appreciate Oncology assistance.  Underwent BM Biopsy today. Results pending.   4-Chronic A fib; Continue with Coumadin.   5-Diabetes; SSI for now due to hypoglycemia.   6-Hyponatremia; related to hypervolemia.  Continue with diuresis.   7-Abdominal Firmness, CT negative for Hematoma.  Postraumatic seroma/mild Sherry Ruffing lesion, patient denies any recent trauma. Probably old injury.   8-Secondary Hyperparathyroidism; Hyperkalemia; mild on lasix. Resolved.  Hyponatremia; related to renal failure, heart failure.   Right LE pain, edema; check doppler.  Cirrhosis.     Estimated body mass index is 30.99 kg/m as calculated from the following:   Height as of this encounter: 5\' 6"  (1.676 m).   Weight as of this encounter: 87.1 kg.   DVT prophylaxis: On Coumadin Code Status: Full code Family Communication: Care discussed with patient Disposition Plan:  Remain in the hospital for management of HF, renal failure.  Consultants:   Cardiology  Nephrology   Procedures:   ECHO 20 %    Antimicrobials:  None  Subjective: Sleepy, she is complaining of right leg pain.  Still with significant LE edema  Objective: Vitals:   03/11/19 2350 03/12/19 0305 03/12/19 0740 03/12/19 0919  BP: (!) 101/55 (!) 92/57  93/61  Pulse: (!) 59 60 62 61  Resp: 17 16 19 19   Temp: (!) 97.5 F (36.4 C) (!) 97 F (36.1 C)  (!) 97.2 F (36.2  C)  TempSrc: Oral Oral  Oral  SpO2:  98%    Weight:  87.1 kg    Height:        Intake/Output Summary (Last 24 hours) at 03/12/2019 1026 Last data filed at 03/12/2019 0947 Gross per 24 hour  Intake 446.94 ml  Output 1450 ml  Net -1003.06 ml   Filed Weights   03/10/19 0248 03/11/19 0408 03/12/19 0305  Weight: 88.8 kg 87.7 kg 87.1 kg    Examination:  General exam: NAD Respiratory  system: Crackles BL Cardiovascular system: S 1, S 2 RRR Gastrointestinal system: BS, present, soft, nt Central nervous system: non focal.  Extremities: Symmetric power, plus 2 edema Skin; no rashes    Data Reviewed: I have personally reviewed following labs and imaging studies  CBC: Recent Labs  Lab 03/07/19 2101 03/07/19 2101 03/08/19 0927 03/08/19 0927 03/09/19 0228 03/09/19 0228 03/09/19 1558 03/09/19 1559 03/10/19 0806 03/11/19 0628 03/12/19 0208  WBC 6.8   < > 7.2  --  7.5  --   --   --  6.3 6.8 7.2  NEUTROABS 5.5  --   --   --  6.2  --   --   --  5.0 5.5 5.7  HGB 10.9*   < > 10.8*   < > 10.3*   < > 11.6* 11.2* 9.8* 10.1* 9.5*  HCT 33.3*   < > 32.8*   < > 30.1*   < > 34.0* 33.0* 29.1* 29.6* 27.6*  MCV 84.9   < > 83.7  --  80.9  --   --   --  80.8 80.7 80.0  PLT 139*   < > 123*  --  133*  --   --   --  127* 139* 141*   < > = values in this interval not displayed.   Basic Metabolic Panel: Recent Labs  Lab 03/08/19 0927 03/08/19 0927 03/09/19 0228 03/09/19 0228 03/09/19 1558 03/09/19 1559 03/10/19 0806 03/11/19 0628 03/12/19 0208  NA 126*   < > 127*   < > 129* 128* 127* 128* 128*  K 5.2*   < > 5.2*   < > 4.5 4.6 4.6 4.3 4.0  CL 93*  --  93*  --   --   --  95* 93* 94*  CO2 17*  --  18*  --   --   --  17* 16* 19*  GLUCOSE 49*  --  201*  --   --   --  169* 159* 150*  BUN 120*  --  129*  --   --   --  142* 150* 153*  CREATININE 4.97*  --  5.09*  --   --   --  5.11* 5.07* 5.06*  CALCIUM 7.9*  --  7.9*  --   --   --  6.9* 6.9* 7.0*  PHOS  --   --   --   --   --   --  7.4* 7.2* 7.3*   < > = values in this interval not displayed.   GFR: Estimated Creatinine Clearance: 13.3 mL/min (A) (by C-G formula based on SCr of 5.06 mg/dL (H)). Liver Function Tests: Recent Labs  Lab 03/07/19 2101 03/08/19 0927 03/10/19 0806 03/11/19 0628 03/12/19 0208  AST 23 24  --   --   --   ALT 17 16  --   --   --   ALKPHOS 324* 303*  --   --   --   BILITOT  3.0* 3.3*  --   --    --   PROT 7.7 7.3  --   --   --   ALBUMIN 2.9* 2.7* 2.2* 2.3* 2.1*   No results for input(s): LIPASE, AMYLASE in the last 168 hours. No results for input(s): AMMONIA in the last 168 hours. Coagulation Profile: Recent Labs  Lab 03/08/19 0927 03/09/19 0228 03/10/19 0806 03/11/19 0628 03/12/19 0208  INR 3.1* 3.1* 2.8* 2.4* 2.5*   Cardiac Enzymes: No results for input(s): CKTOTAL, CKMB, CKMBINDEX, TROPONINI in the last 168 hours. BNP (last 3 results) No results for input(s): PROBNP in the last 8760 hours. HbA1C: No results for input(s): HGBA1C in the last 72 hours. CBG: Recent Labs  Lab 03/11/19 1154 03/11/19 1614 03/11/19 2144 03/12/19 0631 03/12/19 0919  GLUCAP 226* 241* 139* 141* 131*   Lipid Profile: No results for input(s): CHOL, HDL, LDLCALC, TRIG, CHOLHDL, LDLDIRECT in the last 72 hours. Thyroid Function Tests: No results for input(s): TSH, T4TOTAL, FREET4, T3FREE, THYROIDAB in the last 72 hours. Anemia Panel: Recent Labs    03/10/19 0806  FERRITIN 253  TIBC 217*  IRON 29   Sepsis Labs: No results for input(s): PROCALCITON, LATICACIDVEN in the last 168 hours.  Recent Results (from the past 240 hour(s))  Respiratory Panel by RT PCR (Flu A&B, Covid) - Nasopharyngeal Swab     Status: None   Collection Time: 03/07/19  9:21 PM   Specimen: Nasopharyngeal Swab  Result Value Ref Range Status   SARS Coronavirus 2 by RT PCR NEGATIVE NEGATIVE Final    Comment: (NOTE) SARS-CoV-2 target nucleic acids are NOT DETECTED. The SARS-CoV-2 RNA is generally detectable in upper respiratoy specimens during the acute phase of infection. The lowest concentration of SARS-CoV-2 viral copies this assay can detect is 131 copies/mL. A negative result does not preclude SARS-Cov-2 infection and should not be used as the sole basis for treatment or other patient management decisions. A negative result may occur with  improper specimen collection/handling, submission of specimen other  than nasopharyngeal swab, presence of viral mutation(s) within the areas targeted by this assay, and inadequate number of viral copies (<131 copies/mL). A negative result must be combined with clinical observations, patient history, and epidemiological information. The expected result is Negative. Fact Sheet for Patients:  PinkCheek.be Fact Sheet for Healthcare Providers:  GravelBags.it This test is not yet ap proved or cleared by the [place] FDA and  has been authorized for detection and/or diagnosis of SARS-CoV-2 by FDA under an Emergency Use Authorization (EUA). This EUA will remain  in effect (meaning this test can be used) for the duration of the COVID-19 declaration under Section 564(b)(1) of the Act, 21 U.S.C. section 360bbb-3(b)(1), unless the authorization is terminated or revoked sooner.    Influenza A by PCR NEGATIVE NEGATIVE Final   Influenza B by PCR NEGATIVE NEGATIVE Final    Comment: (NOTE) The Xpert Xpress SARS-CoV-2/FLU/RSV assay is intended as an aid in  the diagnosis of influenza from Nasopharyngeal swab specimens and  should not be used as a sole basis for treatment. Nasal washings and  aspirates are unacceptable for Xpert Xpress SARS-CoV-2/FLU/RSV  testing. Fact Sheet for Patients: PinkCheek.be Fact Sheet for Healthcare Providers: GravelBags.it This test is not yet approved or cleared by the [place] FDA and  has been authorized for detection and/or diagnosis of SARS-CoV-2 by  FDA under an Emergency Use Authorization (EUA). This EUA will remain  in effect (meaning this test can be used) for  the duration of the  Covid-19 declaration under Section 564(b)(1) of the Act, 21  U.S.C. section 360bbb-3(b)(1), unless the authorization is  terminated or revoked. Performed at [facility], [address], [place], [place] [address]   MRSA PCR  Screening     Status: None   Collection Time: 03/08/19 10:26 PM   Specimen: Nasopharyngeal  Result Value Ref Range Status   MRSA by PCR NEGATIVE NEGATIVE Final    Comment:        The GeneXpert MRSA Assay (FDA approved for NASAL specimens only), is one component of a comprehensive MRSA colonization surveillance program. It is not intended to diagnose MRSA infection nor to guide or monitor treatment for MRSA infections. Performed at [facility], [place] [address], [place], [place] [address]          Radiology Studies: No results found.      Scheduled Meds: . calcitRIOL  0.5 mcg Oral Daily  . Chlorhexidine Gluconate Cloth  6 each Topical Q0600  . cholecalciferol  1,000 Units Oral Daily  . insulin aspart  0-9 Units Subcutaneous TID WC  . lidocaine  10 mL Intradermal Once  . midodrine  10 mg Oral TID WC  . sodium chloride flush  3 mL Intravenous Q12H  . sodium chloride flush  3 mL Intravenous Q12H  . vitamin B-12  100 mcg Oral Daily   Continuous Infusions: . sodium chloride 10 mL/hr at 03/12/19 0721  . DOBUTamine 2.5 mcg/kg/min (03/12/19 0721)  . furosemide 160 mg (03/12/19 0937)     LOS: 4 days    Time spent: 35 minutes.     Elmarie Shiley, MD [facility]   If 7PM-7AM, please contact night-coverage [email or web address] Password [facility] 03/12/2019, 10:26 AM

## 2019-03-12 NOTE — Progress Notes (Signed)
Hematology In order to evaluate the cause of the renal failure and elevated serum free light chains, bone marrow biopsy was performed this morning. We probably will not have the results until next week. In the meantime continue conservative measures with hemodialysis and supportive care. I will speak to the patient once I have any preliminary results from the bone marrow biopsy.

## 2019-03-12 NOTE — Plan of Care (Signed)
  Problem: Education: Goal: Ability to demonstrate management of disease process will improve Outcome: Progressing Goal: Ability to verbalize understanding of medication therapies will improve Outcome: Progressing Goal: Individualized Educational Video(s) Outcome: Progressing   Problem: Activity: Goal: Capacity to carry out activities will improve Outcome: Progressing   Problem: Cardiac: Goal: Ability to achieve and maintain adequate cardiopulmonary perfusion will improve Outcome: Progressing   Problem: Education: Goal: Knowledge of General Education information will improve Description: Including pain rating scale, medication(s)/side effects and non-pharmacologic comfort measures Outcome: Progressing   Problem: Health Behavior/Discharge Planning: Goal: Ability to manage health-related needs will improve Outcome: Progressing   Problem: Clinical Measurements: Goal: Ability to maintain clinical measurements within normal limits will improve Outcome: Progressing Goal: Will remain free from infection Outcome: Progressing Goal: Diagnostic test results will improve Outcome: Progressing Goal: Respiratory complications will improve Outcome: Progressing Goal: Cardiovascular complication will be avoided Outcome: Progressing   Problem: Activity: Goal: Risk for activity intolerance will decrease Outcome: Progressing   Problem: Nutrition: Goal: Adequate nutrition will be maintained Outcome: Progressing   Problem: Coping: Goal: Level of anxiety will decrease Outcome: Progressing   Problem: Elimination: Goal: Will not experience complications related to bowel motility Outcome: Progressing Goal: Will not experience complications related to urinary retention Outcome: Progressing   Problem: Pain Managment: Goal: General experience of comfort will improve Outcome: Progressing   Problem: Safety: Goal: Ability to remain free from injury will improve Outcome: Progressing    Problem: Skin Integrity: Goal: Risk for impaired skin integrity will decrease Outcome: Progressing   Problem: Education: Goal: Understanding of CV disease, CV risk reduction, and recovery process will improve Outcome: Progressing Goal: Individualized Educational Video(s) Outcome: Progressing   Problem: Activity: Goal: Ability to return to baseline activity level will improve Outcome: Progressing   Problem: Cardiovascular: Goal: Ability to achieve and maintain adequate cardiovascular perfusion will improve Outcome: Progressing Goal: Vascular access site(s) Level 0-1 will be maintained Outcome: Progressing   Problem: Health Behavior/Discharge Planning: Goal: Ability to safely manage health-related needs after discharge will improve Outcome: Progressing   

## 2019-03-12 NOTE — Progress Notes (Signed)
Patient ID: [name], female   DOB: 10/30/1960, 59 y.o.   MRN: [id number]     Advanced Heart Failure Rounding Note  PCP-Cardiologist: Kate Sable, MD   Subjective:    Dobutamine 2.5 started empirically on 1/11.  She is getting high dose IV Lasix and had about 1050 cc total UOP yesterday, less than the day before.   BUN/creatinine 120/4.97 => 129/5.09 => 150/5.07 => 153/5.06.     Still with leg pain bilaterally, she thinks this is from swelling.  No dyspnea. No obvious confusion.  Getting bone marrow biopsy this morning.   Echo: EF < 20%, no LVH, mildly reduced RV systolic function, severe biatrial enlargement, severe TR.  RHC Procedural Findings (dobutamine 2.5 mcg/kg/min): Hemodynamics (mmHg) RA 18 RV 43/18 PA 45/19, mean 28 PCWP mean 19 Oxygen saturations: PA 63% AO 94% Cardiac Output (Fick) 6.1  Cardiac Index (Fick) 3.06 PVR 1.5 WU PAPI 1.4   Objective:   Weight Range: 87.1 kg Body mass index is 30.99 kg/m.   Vital Signs:   Temp:  [97 F (36.1 C)-97.7 F (36.5 C)] 97 F (36.1 C) (01/15 0305) Pulse Rate:  [59-62] 60 (01/15 0305) Resp:  [15-20] 16 (01/15 0305) BP: (90-101)/(52-57) 92/57 (01/15 0305) SpO2:  [97 %-99 %] 98 % (01/15 0305) Weight:  [87.1 kg] 87.1 kg (01/15 0305) Last BM Date: 03/11/19  Weight change: Filed Weights   03/10/19 0248 03/11/19 0408 03/12/19 0305  Weight: 88.8 kg 87.7 kg 87.1 kg    Intake/Output:   Intake/Output Summary (Last 24 hours) at 03/12/2019 0819 Last data filed at 03/12/2019 0721 Gross per 24 hour  Intake 446.94 ml  Output 1450 ml  Net -1003.06 ml      Physical Exam    General: NAD Neck: JVP 16, no thyromegaly or thyroid nodule.  Lungs: Clear to auscultation bilaterally with normal respiratory effort. CV: Lateral PMI.  Heart regular S1/S2, +S3, 2/6 HSM LLSB.  1+ edema to knees.   Abdomen: Soft, nontender, no hepatosplenomegaly, no distention.  Skin: Intact without lesions or rashes.  Neurologic: Alert  and oriented x 3.  Psych: Normal affect. Extremities: No clubbing or cyanosis.  HEENT: Normal.    Telemetry   Atrial fibrillation with BiV pacing, rate 60s (personally reviewed)  Labs    CBC Recent Labs    03/11/19 0628 03/12/19 0208  WBC 6.8 7.2  NEUTROABS 5.5 5.7  HGB 10.1* 9.5*  HCT 29.6* 27.6*  MCV 80.7 80.0  PLT 139* 376*   Basic Metabolic Panel Recent Labs    03/11/19 0628 03/12/19 0208  NA 128* 128*  K 4.3 4.0  CL 93* 94*  CO2 16* 19*  GLUCOSE 159* 150*  BUN 150* 153*  CREATININE 5.07* 5.06*  CALCIUM 6.9* 7.0*  PHOS 7.2* 7.3*   Liver Function Tests Recent Labs    03/11/19 0628 03/12/19 0208  ALBUMIN 2.3* 2.1*   No results for input(s): LIPASE, AMYLASE in the last 72 hours. Cardiac Enzymes No results for input(s): CKTOTAL, CKMB, CKMBINDEX, TROPONINI in the last 72 hours.  BNP: BNP (last 3 results) Recent Labs    03/07/19 2101  BNP 1,072.0*    ProBNP (last 3 results) No results for input(s): PROBNP in the last 8760 hours.   D-Dimer No results for input(s): DDIMER in the last 72 hours. Hemoglobin A1C No results for input(s): HGBA1C in the last 72 hours. Fasting Lipid Panel No results for input(s): CHOL, HDL, LDLCALC, TRIG, CHOLHDL, LDLDIRECT in the last 72 hours. Thyroid  Function Tests No results for input(s): TSH, T4TOTAL, T3FREE, THYROIDAB in the last 72 hours.  Invalid input(s): FREET3  Other results:   Imaging    No results found.   Medications:     Scheduled Medications: . calcitRIOL  0.5 mcg Oral Daily  . Chlorhexidine Gluconate Cloth  6 each Topical Q0600  . cholecalciferol  1,000 Units Oral Daily  . insulin aspart  0-9 Units Subcutaneous TID WC  . lidocaine  10 mL Intradermal Once  . sodium chloride flush  3 mL Intravenous Q12H  . sodium chloride flush  3 mL Intravenous Q12H  . vitamin B-12  100 mcg Oral Daily    Infusions: . sodium chloride 10 mL/hr at 03/12/19 0721  . DOBUTamine 2.5 mcg/kg/min (03/12/19  0721)  . furosemide 66 mL/hr at 03/12/19 0721    PRN Medications: sodium chloride, acetaminophen **OR** acetaminophen, ondansetron **OR** ondansetron (ZOFRAN) IV, sodium chloride flush, traMADol    Assessment/Plan   1. Acute on chronic systolic CHF: Longstanding NICM.  Last echo in 2/19 with EF 15-20%. She has a [employer] CRT-D device. Admitted with progressive dyspnea and 30 lb weight gain. She is very volume overloaded on exam, complicated by SBP in 47M and AKI on CKD stage 4. Empirically started on dobutamine 2.5 due to concern for low output. RHC showed R>L heart failure with low PAPI adequate cardiac output on dobutamine.  Less UOP recorded yesterday,  she remains volume overloaded and BUN now markedly high.  - Continue dobutamine 2.5 mcg/kg/min.   - At this point, she is not responding well to Lasix with intractable volume overload, looks like she is going to require HD.  - Lasix 160 mg IV every 8 hrs to continue until HD started.  - Stopped digoxin and benazepril with AKI.  - Stopped beta blocker with concern for low output.  - No BP room for hydralazine/nitrates.  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  2. AKI on CKD stage 4: Baseline creatinine around 3, now up to 5 with BUN 150.  Not overtly uremic this morning (no confusion).  Suspect cardiorenal syndrome superimposed on possible diabetic nephropathy.  She is unfortunately very volume overloaded as well.  Some response to high dose IV Lasix boluses.  - Continue high dose IV Lasix + dobutamine to support CO.   - With poor response to Lasix + intractable volume overload and very high BUN/creatinine, suspect she will need HD.  Per nephrology (will need line placement with intermittent HD).  - Consideration for renal biopsy given monoclonal gammopathy and renal failure.  3. Chronic atrial fibrillation: Warfarin held for procedures, INR 2.4.  Can cover with heparin gtt when  INR < 2.  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  5. DM: Per primary.  6. Monoclonal gammopathy:  LV morphology does not look concerning for AL amyloidosis. She has been seen by hematology, getting BM biopsy today.  7. Tricuspid regurgitation: Severe by echo.   Length of Stay: [place], MD  03/12/2019, 8:19 AM  Advanced Heart Failure Team Pager [phone] (M-F; 7a - 4p)  Please contact [facility] for night-coverage after hours (4p -7a ) and weekends on [email or web address]

## 2019-03-12 NOTE — Procedures (Signed)
INDICATION: multiple myeloma  Brief examination was performed. ENT: adequate airway clearance Heart: regular rate and rhythm.No Murmurs Lungs: clear to auscultation, no wheezes, normal respiratory effort  Bone Marrow Biopsy and Aspiration Procedure Note   Informed consent was obtained and potential risks including bleeding, infection and pain were reviewed with the patient.  The patient's name, date of birth, identification, consent and allergies were verified prior to the start of procedure and time out was performed.  The left posterior iliac crest was chosen as the site of biopsy.  The skin was prepped with ChloraPrep.   8 cc of 2% lidocaine was used to provide local anaesthesia.   10 cc of bone marrow aspirate was obtained followed by 1cm biopsy.  Pressure was applied to the biopsy site and bandage was placed over the biopsy site. Patient was made to lie on the back for 30 mins with extra pressure.  Vinnie Level RN given post procedure instructions.  .  The procedure was tolerated well. COMPLICATIONS: None BLOOD LOSS: none  Specimens sent for flow cytometry, cytogenetics and additional studies.  Signed Scot Dock, NP

## 2019-03-12 NOTE — Progress Notes (Signed)
Subjective:  S/p bone marrow bx this AM- 1000 of urine but still very overloaded- BUN and crt stable but not good at 150 and 5.  Pt consents to go ahead and do HD.  For non tunneled HD cath due to inr being up   Objective Vital signs in last 24 hours: Vitals:   03/11/19 2350 03/12/19 0305 03/12/19 0740 03/12/19 0919  BP: (!) 101/55 (!) 92/57  93/61  Pulse: (!) 59 60 62 61  Resp: _0 Temp: (!) 97.5 F (36.4 C) (!) 97 F (36.1 C)  (!) 97.2 F (36.2 C)  TempSrc: Oral Oral  Oral  SpO2:  98%    Weight:  87.1 kg    Height:       Weight change: -0.6 kg  Intake/Output Summary (Last 24 hours) at 03/12/2019 0920 Last data filed at 03/12/2019 7425 Gross per 24 hour  Intake 446.94 ml  Output 1450 ml  Net -1003.06 ml    Assessment/ Plan: Pt is a 59 y.o. yo female with nonischemic cardiomyopathy/MGUS/DM and baseline CKD (crt 3's)  who was admitted on 03/07/2019 with massive volume overload and crt 5  Assessment/Plan: 1. Renal-  A on CRF.  Had advanced CKD at baseline. Suspect cardiorenal vs DM.  Has M spike so that also raises the issue of a plasma cell related nephropathy.  Per cards-  Heart does not look like amyloid.  Onc has seen, s/p bone marrow biopsy.   Now with some acute change vs progression in the setting of massive volume overload.  Definitely has the pathology of cardiorenal process.  Right now numbers are not good and she remains relatively hypotensive.   Attempting a trial of dobutamine and high dose lasix.   She is making urine but not really diuresing effectively- BUN rising---150.  Discussed with her the possibility of placing line and starting dialysis today as I think she will need-  Still unclear if she will remain HD dep.  She clinically looks so good that I want to try and intermittent HD , not CRRT. non tunneled cath due to high INR followed by first HD today.  Pt wanted me to talk to her husband, I attempted but line busy times 2.    2. Vol/htn-  Very volume  overloaded and hypotensive-  Attempting dobutamine and lasix-  Able to achieve some diuresis  but have a ways to go.  Will add midodrine as well  3. Anemia- not a major issue at this point which argues against myeloma as does low calcium-  S/p bone marrow biopsy 4. Secondary hyperparathyroidism- is on calcitriol-  Will cont - phos 7.2-  Should come down some with HD- no binder yet 5. Hyperkalemia-  Improved with lasix-   6. Hyponatremia-  Likely hypervolemic hyponatremia-  Improving slowly   Louis Meckel    Labs: Basic Metabolic Panel: Recent Labs  Lab 03/10/19 0806 03/11/19 0628 03/12/19 0208  NA 127* 128* 128*  K 4.6 4.3 4.0  CL 95* 93* 94*  CO2 17* 16* 19*  GLUCOSE 169* 159* 150*  BUN 142* 150* 153*  CREATININE 5.11* 5.07* 5.06*  CALCIUM 6.9* 6.9* 7.0*  PHOS 7.4* 7.2* 7.3*   Liver Function Tests: Recent Labs  Lab 03/07/19 2101 03/07/19 2101 03/08/19 0927 03/08/19 0927 03/10/19 0806 03/11/19 0628 03/12/19 0208  AST 23  --  24  --   --   --   --   ALT 17  --  16  --   --   --   --  ALKPHOS 324*  --  303*  --   --   --   --   BILITOT 3.0*  --  3.3*  --   --   --   --   PROT 7.7  --  7.3  --   --   --   --   ALBUMIN 2.9*   < > 2.7*   < > 2.2* 2.3* 2.1*   < > = values in this interval not displayed.   No results for input(s): LIPASE, AMYLASE in the last 168 hours. No results for input(s): AMMONIA in the last 168 hours. CBC: Recent Labs  Lab 03/08/19 0927 03/08/19 0927 03/09/19 0228 03/09/19 1558 03/10/19 0806 03/11/19 0628 03/12/19 0208  WBC 7.2   < > 7.5  --  6.3 6.8 7.2  NEUTROABS  --   --  6.2  --  5.0 5.5 5.7  HGB 10.8*   < > 10.3*   < > 9.8* 10.1* 9.5*  HCT 32.8*   < > 30.1*   < > 29.1* 29.6* 27.6*  MCV 83.7  --  80.9  --  80.8 80.7 80.0  PLT 123*   < > 133*  --  127* 139* 141*   < > = values in this interval not displayed.   Cardiac Enzymes: No results for input(s): CKTOTAL, CKMB, CKMBINDEX, TROPONINI in the last 168 hours. CBG: Recent  Labs  Lab 03/11/19 0605 03/11/19 1154 03/11/19 1614 03/11/19 2144 03/12/19 0631  GLUCAP 157* 226* 241* 139* 141*    Iron Studies:  Recent Labs    03/10/19 0806  IRON 29  TIBC 217*  FERRITIN 253   Studies/Results: No results found. Medications: Infusions: . sodium chloride 10 mL/hr at 03/12/19 0721  . DOBUTamine 2.5 mcg/kg/min (03/12/19 0721)  . furosemide 66 mL/hr at 03/12/19 8677    Scheduled Medications: . calcitRIOL  0.5 mcg Oral Daily  . Chlorhexidine Gluconate Cloth  6 each Topical Q0600  . cholecalciferol  1,000 Units Oral Daily  . insulin aspart  0-9 Units Subcutaneous TID WC  . lidocaine  10 mL Intradermal Once  . sodium chloride flush  3 mL Intravenous Q12H  . sodium chloride flush  3 mL Intravenous Q12H  . vitamin B-12  100 mcg Oral Daily    have reviewed scheduled and prn medications.  Physical Exam: General: alert, pleasant-  S/p BMBx she said it was "rough" NAD- no sxms of uremia Heart: RRR Lungs: dec BS at bases Abdomen: soft, non tender Extremities: pitting edema throughout    03/12/2019,9:20 AM  LOS: 4 days

## 2019-03-12 NOTE — Progress Notes (Signed)
ANTICOAGULATION CONSULT NOTE - [place] for Coumadin to heparin Indication: atrial fibrillation  Allergies  Allergen Reactions  . Wheat Swelling  . Latex Rash  . Penicillins Rash  . Sulfa Antibiotics Rash    Patient Measurements: Height: 5\' 6"  (167.6 cm) Weight: 192 lb 0.3 oz (87.1 kg) IBW/kg (Calculated) : 59.3  Vital Signs: Temp: 97 F (36.1 C) (01/15 0305) Temp Source: Oral (01/15 0305) BP: 92/57 (01/15 0305) Pulse Rate: 60 (01/15 0305)  Labs: Recent Labs    03/10/19 0806 03/10/19 0806 03/11/19 0628 03/12/19 0208  HGB 9.8*   < > 10.1* 9.5*  HCT 29.1*  --  29.6* 27.6*  PLT 127*  --  139* 141*  LABPROT 29.5*  --  26.0* 26.9*  INR 2.8*  --  2.4* 2.5*  CREATININE 5.11*  --  5.07* 5.06*   < > = values in this interval not displayed.    Estimated Creatinine Clearance: 13.3 mL/min (A) (by C-G formula based on SCr of 5.06 mg/dL (H)).   Medical History: Past Medical History:  Diagnosis Date  . Automatic implantable cardioverter-defibrillator in situ    a. 03/2013: BSX Energen CRTD BiV ICC, ser# [id number]  . Chronic anticoagulation    a. coumadin.  . Chronic atrial fibrillation (HCC)    embolic rind  . Chronic kidney disease    creatinine- 1.8 in 09/2006 and 2.0 in 05/2007; 2.05 in 2011, 2.29 in 2012  . Chronic systolic CHF (congestive heart failure) ([place])    a. 03/2013 Echo: Sev LV dysfxn with sev diff HK, restrictive phys, diast dysfxn, mild MR, sev dil LA/RA, mod PR, PASP 83mmHg.  . Congenital third degree heart block    a. Guidant VVI pacemaker implanted in 09/1997; b. gen change in 01/2004;  c. 03/2013 upgrade to [place], ser# [id number].  . Dizziness and giddiness 05/19/2012  . Dysphagia 08/14/2011   FEB 2013 EGD/DIL 16 MM; GERD; h/o gastroesophageal reflux disease; + H. Pylori gastritis   . GERD (gastroesophageal reflux disease)   . Helicobacter pylori gastritis 08/14/2011  . IDDM (insulin dependent diabetes mellitus)   .  Kidney stones 1990's  . Nonischemic cardiomyopathy ([place])    a. 03/2013 Echo: Sev LV dysfxn with sev diff HK, restrictive phys, diast dysfxn, mild MR, sev dil LA/RA, mod PR, PASP 17mmHg;  b. 03/2013: BSX Energen CRTD BiV ICC, ser# [id number].  Marland Kitchen Potassium (K) excess   . Stroke [facility]) 1999   denies residual on 04/21/2013     Assessment: 54 yoF on warfarin PTA for hx AFib admitted with acute CHF. INR 2.5, H/H stable - may need HD lines, planning for heparin drip once INR < 2.   Goal of Therapy:  INR 2-3 Monitor platelets by anticoagulation protocol: Yes   Plan:  -Hold warfarin for need procedures -Follow protime, start heparin once < 2   Arrie Senate, PharmD, BCPS Clinical Pharmacist [phone] Please check AMION for all [facility] numbers 03/12/2019

## 2019-03-13 ENCOUNTER — Inpatient Hospital Stay (HOSPITAL_COMMUNITY): Payer: Medicaid Other

## 2019-03-13 DIAGNOSIS — M79604 Pain in right leg: Secondary | ICD-10-CM

## 2019-03-13 LAB — CBC WITH DIFFERENTIAL/PLATELET
Abs Immature Granulocytes: 0.06 10*3/uL (ref 0.00–0.07)
Basophils Absolute: 0 10*3/uL (ref 0.0–0.1)
Basophils Relative: 0 %
Eosinophils Absolute: 0.1 10*3/uL (ref 0.0–0.5)
Eosinophils Relative: 1 %
HCT: 27.8 % — ABNORMAL LOW (ref 36.0–46.0)
Hemoglobin: 9.6 g/dL — ABNORMAL LOW (ref 12.0–15.0)
Immature Granulocytes: 1 %
Lymphocytes Relative: 6 %
Lymphs Abs: 0.5 10*3/uL — ABNORMAL LOW (ref 0.7–4.0)
MCH: 27.5 pg (ref 26.0–34.0)
MCHC: 34.5 g/dL (ref 30.0–36.0)
MCV: 79.7 fL — ABNORMAL LOW (ref 80.0–100.0)
Monocytes Absolute: 0.7 10*3/uL (ref 0.1–1.0)
Monocytes Relative: 8 %
Neutro Abs: 7.5 10*3/uL (ref 1.7–7.7)
Neutrophils Relative %: 84 %
Platelets: 144 10*3/uL — ABNORMAL LOW (ref 150–400)
RBC: 3.49 MIL/uL — ABNORMAL LOW (ref 3.87–5.11)
RDW: 16.2 % — ABNORMAL HIGH (ref 11.5–15.5)
WBC: 8.8 10*3/uL (ref 4.0–10.5)
nRBC: 0.3 % — ABNORMAL HIGH (ref 0.0–0.2)

## 2019-03-13 LAB — GLUCOSE, CAPILLARY
Glucose-Capillary: 181 mg/dL — ABNORMAL HIGH (ref 70–99)
Glucose-Capillary: 211 mg/dL — ABNORMAL HIGH (ref 70–99)
Glucose-Capillary: 143 mg/dL — ABNORMAL HIGH (ref 70–99)
Glucose-Capillary: 210 mg/dL — ABNORMAL HIGH (ref 70–99)
Glucose-Capillary: 202 mg/dL — ABNORMAL HIGH (ref 70–99)

## 2019-03-13 LAB — RENAL FUNCTION PANEL
Albumin: 2.3 g/dL — ABNORMAL LOW (ref 3.5–5.0)
Anion gap: 16 — ABNORMAL HIGH (ref 5–15)
BUN: 106 mg/dL — ABNORMAL HIGH (ref 6–20)
CO2: 21 mmol/L — ABNORMAL LOW (ref 22–32)
Calcium: 7 mg/dL — ABNORMAL LOW (ref 8.9–10.3)
Chloride: 93 mmol/L — ABNORMAL LOW (ref 98–111)
Creatinine, Ser: 3.95 mg/dL — ABNORMAL HIGH (ref 0.44–1.00)
GFR calc Af Amer: 14 mL/min — ABNORMAL LOW (ref >60)
GFR calc non Af Amer: 12 mL/min — ABNORMAL LOW (ref >60)
Glucose, Bld: 209 mg/dL — ABNORMAL HIGH (ref 70–99)
Phosphorus: 6.3 mg/dL — ABNORMAL HIGH (ref 2.5–4.6)
Potassium: 3.8 mmol/L (ref 3.5–5.1)
Sodium: 130 mmol/L — ABNORMAL LOW (ref 135–145)

## 2019-03-13 LAB — PROTIME-INR
INR: 2.1 — ABNORMAL HIGH (ref 0.8–1.2)
Prothrombin Time: 23.7 seconds — ABNORMAL HIGH (ref 11.4–15.2)

## 2019-03-13 MED ORDER — HEPARIN (PORCINE) 25000 UT/250ML-% IV SOLN
1450.0000 [IU]/h | INTRAVENOUS | Status: DC
  Administered 2019-03-13: 20:00:00 1200 [IU]/h via INTRAVENOUS
  Administered 2019-03-14 – 2019-03-21 (×11): 1500 [IU]/h via INTRAVENOUS
  Administered 2019-03-22 – 2019-03-27 (×5): 1350 [IU]/h via INTRAVENOUS
  Filled 2019-03-13 (×21): qty 250

## 2019-03-13 MED ORDER — MIDODRINE HCL 5 MG PO TABS
ORAL_TABLET | ORAL | Status: AC
  Filled 2019-03-13: qty 2

## 2019-03-13 MED ORDER — HEPARIN SODIUM (PORCINE) 1000 UNIT/ML DIALYSIS: 20.0000 [IU]/kg | INTRAMUSCULAR | Status: DC | PRN

## 2019-03-13 MED ORDER — ALBUMIN HUMAN 25 % IV SOLN
INTRAVENOUS | Status: AC
  Administered 2019-03-13: 25 g via INTRAVENOUS
  Filled 2019-03-13: qty 100

## 2019-03-13 MED ORDER — HEPARIN SODIUM (PORCINE) 1000 UNIT/ML IJ SOLN
INTRAMUSCULAR | Status: AC
  Administered 2019-03-13: 2600 [IU] via INTRAVENOUS
  Filled 2019-03-13: qty 3

## 2019-03-13 MED ORDER — ALBUMIN HUMAN 25 % IV SOLN: 25.0000 g | Freq: Once | INTRAVENOUS | Status: AC

## 2019-03-13 MED ORDER — CHLORHEXIDINE GLUCONATE CLOTH 2 % EX PADS
6.0000 | MEDICATED_PAD | Freq: Every day | CUTANEOUS | Status: DC
  Administered 2019-03-13 – 2019-03-18 (×6): 6 via TOPICAL

## 2019-03-13 MED ORDER — SALINE SPRAY 0.65 % NA SOLN
1.0000 | NASAL | Status: DC | PRN
  Administered 2019-03-13: 18:00:00 1 via NASAL
  Filled 2019-03-13: qty 44

## 2019-03-13 NOTE — Progress Notes (Signed)
Subjective:  Underwent first HD yest-  Removed 1 liter. Also made a liter of urine- does feel better   Objective Vital signs in last 24 hours: Vitals:   03/12/19 2305 03/13/19 0145 03/13/19 0300 03/13/19 0336  BP: 107/60   (!) 106/58  Pulse: (!) 59 (!) 59 (!) 58 (!) 59  Resp: 18 17 20 18   Temp: 98.2 F (36.8 C)   98 F (36.7 C)  TempSrc: Oral   Oral  SpO2: 98%   95%  Weight:    86.8 kg  Height:       Weight change: 3.8 kg  Intake/Output Summary (Last 24 hours) at 03/13/2019 0732 Last data filed at 03/13/2019 6945 Gross per 24 hour  Intake 429.55 ml  Output 1600 ml  Net -1170.45 ml    Assessment/ Plan: Pt is a 59 y.o. yo female with nonischemic cardiomyopathy/MGUS/DM and baseline CKD (crt 3's)  who was admitted on 03/07/2019 with massive volume overload and crt 5  Assessment/Plan: 1. Renal-  A on CRF.  Had advanced CKD at baseline. Suspect cardiorenal vs DM.  Has M spike so that also raises the issue of a plasma cell related nephropathy.  Per cards-  Heart does not look like amyloid.  Onc has seen, s/p bone marrow biopsy.   Now with some acute change vs progression in the setting of massive volume overload.  Definitely has the pathology of cardiorenal process.  Right now numbers not good and she remains relatively hypotensive.   Attempting a trial of dobutamine and high dose lasix.   She is making urine but not really diuresing effectively- BUN rising.  S/p vascath and first HD yest.   Still unclear if she will remain HD dep.  Will plan for another HD today then follow  2. Vol/htn-  Very volume overloaded and relatively hypotensive-  Attempting dobutamine and lasix-  Able to achieve some diuresis  but have a ways to go.  Will add midodrine as well  3. Anemia- not a major issue at this point which argues against myeloma as does low calcium-  S/p bone marrow biopsy- results pending 4. Secondary hyperparathyroidism- is on calcitriol-  Will cont - phos 7.2-  Should come down some with HD- no  binder yet 5. Hyperkalemia-  Resolved    6. Hyponatremia-  Likely hypervolemic hyponatremia-  Improving slowly   Louis Meckel    Labs: Basic Metabolic Panel: Recent Labs  Lab 03/11/19 0628 03/12/19 0208 03/13/19 0213  NA 128* 128* 130*  K 4.3 4.0 3.8  CL 93* 94* 93*  CO2 16* 19* 21*  GLUCOSE 159* 150* 209*  BUN 150* 153* 106*  CREATININE 5.07* 5.06* 3.95*  CALCIUM 6.9* 7.0* 7.0*  PHOS 7.2* 7.3* 6.3*   Liver Function Tests: Recent Labs  Lab 03/07/19 2101 03/07/19 2101 03/08/19 0927 03/10/19 0806 03/11/19 0628 03/12/19 0208 03/13/19 0213  AST 23  --  24  --   --   --   --   ALT 17  --  16  --   --   --   --   ALKPHOS 324*  --  303*  --   --   --   --   BILITOT 3.0*  --  3.3*  --   --   --   --   PROT 7.7  --  7.3  --   --   --   --   ALBUMIN 2.9*   < > 2.7*   < >  2.3* 2.1* 2.3*   < > = values in this interval not displayed.   No results for input(s): LIPASE, AMYLASE in the last 168 hours. No results for input(s): AMMONIA in the last 168 hours. CBC: Recent Labs  Lab 03/09/19 0228 03/09/19 1558 03/10/19 0806 03/10/19 0806 03/11/19 0628 03/12/19 0208 03/13/19 0213  WBC 7.5  --  6.3   < > 6.8 7.2 8.8  NEUTROABS 6.2  --  5.0   < > 5.5 5.7 7.5  HGB 10.3*   < > 9.8*   < > 10.1* 9.5* 9.6*  HCT 30.1*   < > 29.1*   < > 29.6* 27.6* 27.8*  MCV 80.9  --  80.8  --  80.7 80.0 79.7*  PLT 133*  --  127*   < > 139* 141* 144*   < > = values in this interval not displayed.   Cardiac Enzymes: No results for input(s): CKTOTAL, CKMB, CKMBINDEX, TROPONINI in the last 168 hours. CBG: Recent Labs  Lab 03/12/19 0919 03/12/19 1121 03/12/19 1755 03/12/19 2123 03/13/19 0632  GLUCAP 131* 137* 86 200* 181*    Iron Studies:  Recent Labs    03/10/19 0806  IRON 29  TIBC 217*  FERRITIN 253   Studies/Results: IR Fluoro Guide CV Line Right  Result Date: 03/12/2019 INDICATION: 59 year old female with acute kidney injury in need of hemodialysis. She presents for  placement of a temporary hemodialysis catheter. EXAM: IR ULTRASOUND GUIDANCE VASC ACCESS RIGHT; IR RIGHT FLUORO GUIDE CV LINE MEDICATIONS: None. ANESTHESIA/SEDATION: None. FLUOROSCOPY TIME:  Fluoroscopy Time: 0 minutes 30 seconds (2.5 mGy). COMPLICATIONS: None immediate. PROCEDURE: Informed written consent was obtained from the patient after a thorough discussion of the procedural risks, benefits and alternatives. All questions were addressed. Maximal Sterile Barrier Technique was utilized including caps, mask, sterile gowns, sterile gloves, sterile drape, hand hygiene and skin antiseptic. A timeout was performed prior to the initiation of the procedure. The right internal jugular vein was interrogated with ultrasound and found to be widely patent. An image was obtained and stored for the medical record. Local anesthesia was attained by infiltration with 1% lidocaine. A small dermatotomy was made. Under real-time sonographic guidance, the vessel was punctured with a 21 gauge micropuncture needle. Using standard technique, the initial micro needle was exchanged over a 0.018 micro wire for a transitional 4 [place] micro sheath. The micro sheath was then exchanged over a 0.035 wire for a fascial dilator. The soft tissue tract was then dilated. A 16 cm triple-lumen temporary hemodialysis catheter was then advanced over the wire and positioned with the catheter tip in the upper right atrium. Catheter flushes and aspirates easily. The catheter was flushed with heparinized saline, capped and secured to the skin with 0 Prolene suture. A sterile bandage was applied. IMPRESSION: Successful placement of a right IJ approach 16 cm non tunneled triple-lumen hemodialysis catheter. The catheter tips are in the upper right atrium and the catheter is ready for immediate use. Signed, Criselda Peaches, MD, [facility] Radiology Electronically Signed   By: Jacqulynn Cadet M.D.    On: 03/12/2019 16:20   IR US Guide Vasc Access Right  Result Date: 03/12/2019 INDICATION: 59 year old female with acute kidney injury in need of hemodialysis. She presents for placement of a temporary hemodialysis catheter. EXAM: IR ULTRASOUND GUIDANCE VASC ACCESS RIGHT; IR RIGHT FLUORO GUIDE CV LINE MEDICATIONS: None. ANESTHESIA/SEDATION: None. FLUOROSCOPY TIME:  Fluoroscopy Time: 0 minutes 30 seconds (2.5 mGy). COMPLICATIONS:  None immediate. PROCEDURE: Informed written consent was obtained from the patient after a thorough discussion of the procedural risks, benefits and alternatives. All questions were addressed. Maximal Sterile Barrier Technique was utilized including caps, mask, sterile gowns, sterile gloves, sterile drape, hand hygiene and skin antiseptic. A timeout was performed prior to the initiation of the procedure. The right internal jugular vein was interrogated with ultrasound and found to be widely patent. An image was obtained and stored for the medical record. Local anesthesia was attained by infiltration with 1% lidocaine. A small dermatotomy was made. Under real-time sonographic guidance, the vessel was punctured with a 21 gauge micropuncture needle. Using standard technique, the initial micro needle was exchanged over a 0.018 micro wire for a transitional 4 [place] micro sheath. The micro sheath was then exchanged over a 0.035 wire for a fascial dilator. The soft tissue tract was then dilated. A 16 cm triple-lumen temporary hemodialysis catheter was then advanced over the wire and positioned with the catheter tip in the upper right atrium. Catheter flushes and aspirates easily. The catheter was flushed with heparinized saline, capped and secured to the skin with 0 Prolene suture. A sterile bandage was applied. IMPRESSION: Successful placement of a right IJ approach 16 cm non tunneled triple-lumen hemodialysis catheter. The catheter tips are in the upper right atrium and the catheter is ready  for immediate use. Signed, Criselda Peaches, MD, [place] [facility] Radiology Electronically Signed   By: Jacqulynn Cadet M.D.   On: 03/12/2019 16:20   Medications: Infusions: . sodium chloride 10 mL/hr at 03/12/19 1700  . DOBUTamine 2.5 mcg/kg/min (03/13/19 0300)  . furosemide Stopped (03/12/19 2211)    Scheduled Medications: . calcitRIOL  0.5 mcg Oral Daily  . Chlorhexidine Gluconate Cloth  6 each Topical Q0600  . cholecalciferol  1,000 Units Oral Daily  . insulin aspart  0-9 Units Subcutaneous TID WC  . lidocaine  10 mL Intradermal Once  . midodrine  10 mg Oral TID WC  . sodium chloride flush  3 mL Intravenous Q12H  . sodium chloride flush  3 mL Intravenous Q12H  . vitamin B-12  100 mcg Oral Daily    have reviewed scheduled and prn medications.  Physical Exam: General: alert, pleasant-  Does feel better Heart: RRR Lungs: dec BS at bases Abdomen: soft, non tender Extremities: pitting edema throughout    03/13/2019,7:32 AM  LOS: 5 days

## 2019-03-13 NOTE — Progress Notes (Signed)
PROGRESS NOTE    [name]  LKG:[id number] DOB: 05/30/1960 DOA: 03/07/2019 PCP: [name], MD   Brief Narrative: 59 year old with past medical history significant for MGUS, thrombocytopenia, A. fib on Coumadin, CHB with pacemaker, nonischemic cardiomyopathy with ejection fraction 15 to 20% with ICD, CKD stage IV baseline creatinine 3.0, cirrhosis who presented with several weeks of progressive swelling about 30 pound weight gain.  Evaluation in the ED patient was found to have a creatinine more than 5, anasarca, hemoglobin of 11.  Chest x-ray was clear but she required 2 L of supplemental oxygen.  Patient received 40 mg of IV Lasix without significant urine output.  Patient was transferred to [facility] for heart failure team evaluation, patient was a started on inotrope.    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 Started on dobutamine drip. Continue with IV Laxis 160 mg TID>  Urine Out put 1  L yesterday.  Plan to continue with current management.  Cath on 03-10-2019; PA 45/19.  ECHO Ef 20 % , Severe TR.  Underwent HD 1-15 and again 1-15.  2-Acute on chronic renal failure stage IV: Prior creatinine 2.9--3.5 last year. Considering cardiorenal syndrome. Nephrology following for need of CRRT.  Underwent HD 1-15 and 1-6 for large volume removal.   3-Anemia; MGUS with Thrombocytopenia;  Follow up with Dr Delton Coombes.  M spike 22/28. Kappa free light chain 1392. B 2 microglobulin 9.6.  electrophoresis protein and kappa/lamda ratio : 43, M spike 1.2.  Appreciate  Oncology assistance.  Underwent BM Biopsy 1-15. Results pending.   4-Chronic A fib; Coumadin on hold, in case require procedure.  Plan to start heparin when INR less than 2  5-Diabetes; SSI for now due to hypoglycemia.   6-Hyponatremia; related to hypervolemia.  Continue with diuresis.   7-Abdominal Firmness, CT negative for Hematoma.  Postraumatic seroma/mild Sherry Ruffing lesion, patient denies any recent trauma. Probably old injury.   8-Secondary Hyperparathyroidism; Hyperkalemia; mild on lasix. Resolved.  Hyponatremia; related to renal failure, heart failure.   Right LE pain, edema; doppler negative Cirrhosis.     Estimated body mass index is 30.89 kg/m as calculated from the following:   Height as of this encounter: 5\' 6"  (1.676 m).   Weight as of this encounter: 86.8 kg.   DVT prophylaxis: On Coumadin Code Status: Full code Family Communication: Care discussed with patient Disposition Plan:  Remain in the hospital for management of HF, renal failure.  Consultants:   Cardiology  Nephrology   Procedures:   ECHO 20 %    Antimicrobials:  None  Subjective: She is alert, appears better spirit. Feeling better.   Objective: Vitals:   03/13/19 0145 03/13/19 0300 03/13/19 0336 03/13/19 0855  BP:   (!) 106/58 (!) 93/52  Pulse: (!) 59 (!) 58 (!) 59 (!) 59  Resp: 17 20 18 14   Temp:   98 F (36.7 C) 97.8 F (36.6 C)  TempSrc:   Oral Oral  SpO2:   95% 97%  Weight:   86.8 kg   Height:  Intake/Output Summary (Last 24 hours) at 03/13/2019 0928 Last data filed at 03/13/2019 3419 Gross per 24 hour  Intake 429.55 ml  Output 1600 ml  Net -1170.45 ml   Filed Weights   03/12/19 1500 03/12/19 1708 03/13/19 0336  Weight: 90.9 kg 89.7 kg 86.8 kg    Examination:  General exam: NAD Respiratory system: CTA Cardiovascular system: S 1, S 2 RRR Gastrointestinal system: BS present, soft, nt Central nervous system: No focal.  Extremities: symmetric power,  plus 2 edema Skin; no rashes    Data Reviewed: I have personally reviewed following labs and imaging studies  CBC: Recent Labs  Lab 03/09/19 0228 03/09/19 1558 03/09/19 1559 03/10/19 0806 03/11/19 0628 03/12/19 0208 03/13/19 0213  WBC 7.5  --   --  6.3 6.8 7.2 8.8  NEUTROABS 6.2  --   --  5.0 5.5 5.7 7.5  HGB 10.3*   < > 11.2* 9.8* 10.1* 9.5* 9.6*  HCT 30.1*   < > 33.0* 29.1* 29.6* 27.6* 27.8*  MCV 80.9  --   --  80.8 80.7 80.0 79.7*  PLT 133*  --   --  127* 139* 141* 144*   < > = values in this interval not displayed.   Basic Metabolic Panel: Recent Labs  Lab 03/09/19 0228 03/09/19 1558 03/09/19 1559 03/10/19 0806 03/11/19 0628 03/12/19 0208 03/13/19 0213  NA 127*   < > 128* 127* 128* 128* 130*  K 5.2*   < > 4.6 4.6 4.3 4.0 3.8  CL 93*  --   --  95* 93* 94* 93*  CO2 18*  --   --  17* 16* 19* 21*  GLUCOSE 201*  --   --  169* 159* 150* 209*  BUN 129*  --   --  142* 150* 153* 106*  CREATININE 5.09*  --   --  5.11* 5.07* 5.06* 3.95*  CALCIUM 7.9*  --   --  6.9* 6.9* 7.0* 7.0*  PHOS  --   --   --  7.4* 7.2* 7.3* 6.3*   < > = values in this interval not displayed.   GFR: Estimated Creatinine Clearance: 17 mL/min (A) (by C-G formula based on SCr of 3.95 mg/dL (H)). Liver Function Tests: Recent Labs  Lab 03/07/19 2101 03/07/19 2101 03/08/19 0927 03/10/19 0806 03/11/19 0628 03/12/19 0208 03/13/19 0213  AST 23  --  24  --   --   --   --   ALT 17  --  16  --   --   --   --   ALKPHOS 324*  --  303*  --   --   --   --   BILITOT 3.0*  --  3.3*  --   --   --   --   PROT 7.7  --  7.3  --   --   --   --   ALBUMIN 2.9*   < > 2.7* 2.2* 2.3* 2.1* 2.3*   < > = values in this interval not displayed.   No results for input(s): LIPASE, AMYLASE in the last 168 hours. No results for input(s): AMMONIA in the last 168 hours. Coagulation Profile: Recent Labs  Lab 03/09/19 0228 03/10/19 0806 03/11/19 0628 03/12/19 0208 03/13/19 0213  INR 3.1* 2.8* 2.4* 2.5* 2.1*    Cardiac Enzymes: No results for input(s): CKTOTAL, CKMB, CKMBINDEX, TROPONINI in the last 168 hours. BNP (last 3 results) No results for input(s): PROBNP in the last 8760 hours. HbA1C: No  results for input(s): HGBA1C in the last 72 hours. CBG: Recent Labs  Lab 03/12/19 1121 03/12/19 1755 03/12/19 2123 03/13/19 0632 03/13/19 0901  GLUCAP 137* 86 200* 181* 211*   Lipid Profile: No results for input(s): CHOL, HDL, LDLCALC, TRIG, CHOLHDL, LDLDIRECT in the last 72 hours. Thyroid Function Tests: No results for input(s): TSH, T4TOTAL, FREET4, T3FREE, THYROIDAB in the last 72 hours. Anemia Panel: No results for input(s): VITAMINB12, FOLATE, FERRITIN, TIBC, IRON, RETICCTPCT in the last 72 hours. Sepsis Labs: No results for input(s): PROCALCITON, LATICACIDVEN in the last 168 hours.  Recent Results (from the past 240 hour(s))  Respiratory Panel by RT PCR (Flu A&B, Covid) - Nasopharyngeal Swab     Status: None   Collection Time: 03/07/19  9:21 PM   Specimen: Nasopharyngeal Swab  Result Value Ref Range Status   SARS Coronavirus 2 by RT PCR NEGATIVE NEGATIVE Final    Comment: (NOTE) SARS-CoV-2 target nucleic acids are NOT DETECTED. The SARS-CoV-2 RNA is generally detectable in upper respiratoy specimens during the acute phase of infection. The lowest concentration of SARS-CoV-2 viral copies this assay can detect is 131 copies/mL. A negative result does not preclude SARS-Cov-2 infection and should not be used as the sole basis for treatment or other patient management decisions. A negative result may occur with  improper specimen collection/handling, submission of specimen other than nasopharyngeal swab, presence of viral mutation(s) within the areas targeted by this assay, and inadequate number of viral copies (<131 copies/mL). A negative result must be combined with clinical observations, patient history, and epidemiological information. The expected result is Negative. Fact Sheet  for Patients:  PinkCheek.be Fact Sheet for Healthcare Providers:  GravelBags.it This test is not yet ap proved or cleared by the [place] FDA and  has been authorized for detection and/or diagnosis of SARS-CoV-2 by FDA under an Emergency Use Authorization (EUA). This EUA will remain  in effect (meaning this test can be used) for the duration of the COVID-19 declaration under Section 564(b)(1) of the Act, 21 U.S.C. section 360bbb-3(b)(1), unless the authorization is terminated or revoked sooner.    Influenza A by PCR NEGATIVE NEGATIVE Final   Influenza B by PCR NEGATIVE NEGATIVE Final    Comment: (NOTE) The Xpert Xpress SARS-CoV-2/FLU/RSV assay is intended as an aid in  the diagnosis of influenza from Nasopharyngeal swab specimens and  should not be used as a sole basis for treatment. Nasal washings and  aspirates are unacceptable for Xpert Xpress SARS-CoV-2/FLU/RSV  testing. Fact Sheet for Patients: PinkCheek.be Fact Sheet for Healthcare Providers: GravelBags.it This test is not yet approved or cleared by the [place] FDA and  has been authorized for detection and/or diagnosis of SARS-CoV-2 by  FDA under an Emergency Use Authorization (EUA). This EUA will remain  in effect (meaning this test can be used) for the duration of the  Covid-19 declaration under Section 564(b)(1) of the Act, 21  U.S.C. section 360bbb-3(b)(1), unless the authorization is  terminated or revoked. Performed at [facility], [address] [place], [place] [address]   MRSA PCR Screening     Status: None   Collection Time: 03/08/19 10:26 PM   Specimen: Nasopharyngeal  Result Value Ref Range Status   MRSA by PCR NEGATIVE NEGATIVE Final    Comment:        The GeneXpert MRSA Assay (FDA approved for NASAL specimens only), is one component of a comprehensive MRSA  colonization surveillance program. It is not intended to diagnose MRSA infection nor  to guide or monitor treatment for MRSA infections. Performed at [facility], [place] [address] [place], [place] [address]          Radiology Studies: IR Fluoro Guide CV Line Right  Result Date: 03/12/2019 INDICATION: 59 year old female with acute kidney injury in need of hemodialysis. She presents for placement of a temporary hemodialysis catheter. EXAM: IR ULTRASOUND GUIDANCE VASC ACCESS RIGHT; IR RIGHT FLUORO GUIDE CV LINE MEDICATIONS: None. ANESTHESIA/SEDATION: None. FLUOROSCOPY TIME:  Fluoroscopy Time: 0 minutes 30 seconds (2.5 mGy). COMPLICATIONS: None immediate. PROCEDURE: Informed written consent was obtained from the patient after a thorough discussion of the procedural risks, benefits and alternatives. All questions were addressed. Maximal Sterile Barrier Technique was utilized including caps, mask, sterile gowns, sterile gloves, sterile drape, hand hygiene and skin antiseptic. A timeout was performed prior to the initiation of the procedure. The right internal jugular vein was interrogated with ultrasound and found to be widely patent. An image was obtained and stored for the medical record. Local anesthesia was attained by infiltration with 1% lidocaine. A small dermatotomy was made. Under real-time sonographic guidance, the vessel was punctured with a 21 gauge micropuncture needle. Using standard technique, the initial micro needle was exchanged over a 0.018 micro wire for a transitional 4 [place] micro sheath. The micro sheath was then exchanged over a 0.035 wire for a fascial dilator. The soft tissue tract was then dilated. A 16 cm triple-lumen temporary hemodialysis catheter was then advanced over the wire and positioned with the catheter tip in the upper right atrium. Catheter flushes and aspirates easily. The catheter was flushed with heparinized saline, capped and secured to the skin with 0  Prolene suture. A sterile bandage was applied. IMPRESSION: Successful placement of a right IJ approach 16 cm non tunneled triple-lumen hemodialysis catheter. The catheter tips are in the upper right atrium and the catheter is ready for immediate use. Signed, Criselda Peaches, MD, [facility] Electronically Signed   By: Jacqulynn Cadet M.D.   On: 03/12/2019 16:20   IR US Guide Vasc Access Right  Result Date: 03/12/2019 INDICATION: 59 year old female with acute kidney injury in need of hemodialysis. She presents for placement of a temporary hemodialysis catheter. EXAM: IR ULTRASOUND GUIDANCE VASC ACCESS RIGHT; IR RIGHT FLUORO GUIDE CV LINE MEDICATIONS: None. ANESTHESIA/SEDATION: None. FLUOROSCOPY TIME:  Fluoroscopy Time: 0 minutes 30 seconds (2.5 mGy). COMPLICATIONS: None immediate. PROCEDURE: Informed written consent was obtained from the patient after a thorough discussion of the procedural risks, benefits and alternatives. All questions were addressed. Maximal Sterile Barrier Technique was utilized including caps, mask, sterile gowns, sterile gloves, sterile drape, hand hygiene and skin antiseptic. A timeout was performed prior to the initiation of the procedure. The right internal jugular vein was interrogated with ultrasound and found to be widely patent. An image was obtained and stored for the medical record. Local anesthesia was attained by infiltration with 1% lidocaine. A small dermatotomy was made. Under real-time sonographic guidance, the vessel was punctured with a 21 gauge micropuncture needle. Using standard technique, the initial micro needle was exchanged over a 0.018 micro wire for a transitional 4 [place] micro sheath. The micro sheath was then exchanged over a 0.035 wire for a fascial dilator. The soft tissue tract was then dilated. A 16 cm triple-lumen temporary hemodialysis catheter was then advanced over the wire and  positioned with the catheter tip in the upper right atrium. Catheter flushes and aspirates easily. The catheter was  flushed with heparinized saline, capped and secured to the skin with 0 Prolene suture. A sterile bandage was applied. IMPRESSION: Successful placement of a right IJ approach 16 cm non tunneled triple-lumen hemodialysis catheter. The catheter tips are in the upper right atrium and the catheter is ready for immediate use. Signed, Criselda Peaches, MD, [place] [facility] Electronically Signed   By: Jacqulynn Cadet M.D.   On: 03/12/2019 16:20   VAS [place] LOWER EXTREMITY VENOUS (DVT)  Result Date: 03/13/2019  Lower Venous Study Indications: Pain.  Limitations: Pain tolerance. Comparison Study: no prior Performing Technologist: Abram Sander RVS  Examination Guidelines: A complete evaluation includes B-mode imaging, spectral Doppler, color Doppler, and power Doppler as needed of all accessible portions of each vessel. Bilateral testing is considered an integral part of a complete examination. Limited examinations for reoccurring indications may be performed as noted.  +---------+---------------+---------+-----------+----------+-------------------+ RIGHT    CompressibilityPhasicitySpontaneityPropertiesThrombus Aging      +---------+---------------+---------+-----------+----------+-------------------+ CFV      Full           Yes      Yes                                      +---------+---------------+---------+-----------+----------+-------------------+ SFJ      Full                                                             +---------+---------------+---------+-----------+----------+-------------------+ FV Prox  Full                                                             +---------+---------------+---------+-----------+----------+-------------------+ FV Mid                  Yes      Yes                   unable to tolerate                                                        compression         +---------+---------------+---------+-----------+----------+-------------------+ FV Distal               Yes      Yes                  unable to tolerate                                                        compression         +---------+---------------+---------+-----------+----------+-------------------+ PFV      Full                                                             +---------+---------------+---------+-----------+----------+-------------------+  POP      Full           Yes      Yes                                      +---------+---------------+---------+-----------+----------+-------------------+ PTV      Full                                                             +---------+---------------+---------+-----------+----------+-------------------+ PERO     Full                                                             +---------+---------------+---------+-----------+----------+-------------------+   +----+---------------+---------+-----------+----------+--------------+ LEFTCompressibilityPhasicitySpontaneityPropertiesThrombus Aging +----+---------------+---------+-----------+----------+--------------+ CFV Full           Yes                                          +----+---------------+---------+-----------+----------+--------------+     Summary: Right: There is no evidence of deep vein thrombosis in the lower extremity. However, portions of this examination were limited- see technologist comments above. No cystic structure found in the popliteal fossa. Left: No evidence of common femoral vein obstruction.  *See table(s) above for measurements and observations.    Preliminary         Scheduled Meds: . calcitRIOL  0.5 mcg Oral Daily  . Chlorhexidine Gluconate Cloth  6 each Topical Q0600  . Chlorhexidine Gluconate Cloth  6 each  Topical Q0600  . cholecalciferol  1,000 Units Oral Daily  . insulin aspart  0-9 Units Subcutaneous TID WC  . lidocaine  10 mL Intradermal Once  . midodrine  10 mg Oral TID WC  . sodium chloride flush  3 mL Intravenous Q12H  . sodium chloride flush  3 mL Intravenous Q12H  . vitamin B-12  100 mcg Oral Daily   Continuous Infusions: . sodium chloride 10 mL/hr at 03/12/19 1700  . DOBUTamine 2.5 mcg/kg/min (03/13/19 0300)  . furosemide 160 mg (03/13/19 0859)  . heparin       LOS: 5 days    Time spent: 35 minutes.     Elmarie Shiley, MD [facility]   If 7PM-7AM, please contact night-coverage [email or web address] Password [facility] 03/13/2019, 9:28 AM

## 2019-03-13 NOTE — Progress Notes (Signed)
Lower extremity venous has been completed.   Preliminary results in CV Proc.   Abram Sander 03/13/2019 8:52 AM

## 2019-03-13 NOTE — Plan of Care (Signed)
  Problem: Education: Goal: Ability to demonstrate management of disease process will improve Outcome: Progressing Goal: Ability to verbalize understanding of medication therapies will improve Outcome: Progressing Goal: Individualized Educational Video(s) Outcome: Progressing   Problem: Activity: Goal: Capacity to carry out activities will improve Outcome: Progressing   Problem: Cardiac: Goal: Ability to achieve and maintain adequate cardiopulmonary perfusion will improve Outcome: Progressing   Problem: Education: Goal: Knowledge of General Education information will improve Description: Including pain rating scale, medication(s)/side effects and non-pharmacologic comfort measures Outcome: Progressing   Problem: Health Behavior/Discharge Planning: Goal: Ability to manage health-related needs will improve Outcome: Progressing   Problem: Clinical Measurements: Goal: Ability to maintain clinical measurements within normal limits will improve Outcome: Progressing Goal: Will remain free from infection Outcome: Progressing Goal: Diagnostic test results will improve Outcome: Progressing Goal: Respiratory complications will improve Outcome: Progressing Goal: Cardiovascular complication will be avoided Outcome: Progressing   Problem: Activity: Goal: Risk for activity intolerance will decrease Outcome: Progressing   Problem: Nutrition: Goal: Adequate nutrition will be maintained Outcome: Progressing   Problem: Coping: Goal: Level of anxiety will decrease Outcome: Progressing   Problem: Elimination: Goal: Will not experience complications related to bowel motility Outcome: Progressing Goal: Will not experience complications related to urinary retention Outcome: Progressing   Problem: Pain Managment: Goal: General experience of comfort will improve Outcome: Progressing   Problem: Safety: Goal: Ability to remain free from injury will improve Outcome: Progressing    Problem: Skin Integrity: Goal: Risk for impaired skin integrity will decrease Outcome: Progressing   Problem: Education: Goal: Understanding of CV disease, CV risk reduction, and recovery process will improve Outcome: Progressing Goal: Individualized Educational Video(s) Outcome: Progressing   Problem: Activity: Goal: Ability to return to baseline activity level will improve Outcome: Progressing   Problem: Cardiovascular: Goal: Ability to achieve and maintain adequate cardiovascular perfusion will improve Outcome: Progressing Goal: Vascular access site(s) Level 0-1 will be maintained Outcome: Progressing   Problem: Health Behavior/Discharge Planning: Goal: Ability to safely manage health-related needs after discharge will improve Outcome: Progressing   

## 2019-03-13 NOTE — Progress Notes (Signed)
Patient ID: [name], female   DOB: 1960/05/09, 59 y.o.   MRN: [id number]     Advanced Heart Failure Rounding Note  PCP-Cardiologist: Kate Sable, MD   Subjective:    Dobutamine 2.5 started empirically on 1/11.  She is getting high dose IV Lasix and had about 1000 cc total UOP yesterday.  She had 1st HD yesterday, no problems. Now on midodrine 10 mg tid.  Weight down 6 lbs.  Still with leg pain bilaterally, she thinks this is from swelling.  No dyspnea.   Echo: EF < 20%, no LVH, mildly reduced RV systolic function, severe biatrial enlargement, severe TR.  RHC Procedural Findings (dobutamine 2.5 mcg/kg/min): Hemodynamics (mmHg) RA 18 RV 43/18 PA 45/19, mean 28 PCWP mean 19 Oxygen saturations: PA 63% AO 94% Cardiac Output (Fick) 6.1  Cardiac Index (Fick) 3.06 PVR 1.5 WU PAPI 1.4   Objective:   Weight Range: 86.8 kg Body mass index is 30.89 kg/m.   Vital Signs:   Temp:  [97.2 F (36.2 C)-98.2 F (36.8 C)] 97.8 F (36.6 C) (01/16 0855) Pulse Rate:  [58-61] 59 (01/16 0855) Resp:  [14-20] 14 (01/16 0855) BP: (86-107)/(50-61) 93/52 (01/16 0855) SpO2:  [95 %-99 %] 97 % (01/16 0855) Weight:  [86.8 kg-90.9 kg] 86.8 kg (01/16 0336) Last BM Date: 03/11/19  Weight change: Filed Weights   03/12/19 1500 03/12/19 1708 03/13/19 0336  Weight: 90.9 kg 89.7 kg 86.8 kg    Intake/Output:   Intake/Output Summary (Last 24 hours) at 03/13/2019 0906 Last data filed at 03/13/2019 9628 Gross per 24 hour  Intake 429.55 ml  Output 1600 ml  Net -1170.45 ml      Physical Exam    General: NAD Neck: JVP 14-16 cm, no thyromegaly or thyroid nodule.  Lungs: Clear to auscultation bilaterally with normal respiratory effort. CV: Nondisplaced PMI.  Heart regular S1/S2, +S3, 3/6 HSM LLSB. 1+ edema to knees.  No carotid bruit.  Normal pedal pulses.  Abdomen: Soft, nontender, no hepatosplenomegaly, no distention.  Skin: Intact without lesions or rashes.  Neurologic: Alert and  oriented x 3.  Psych: Normal affect. Extremities: No clubbing or cyanosis.  HEENT: Normal.    Telemetry   Atrial fibrillation with BiV pacing, rate 60s (personally reviewed)  Labs    CBC Recent Labs    03/12/19 0208 03/13/19 0213  WBC 7.2 8.8  NEUTROABS 5.7 7.5  HGB 9.5* 9.6*  HCT 27.6* 27.8*  MCV 80.0 79.7*  PLT 141* 366*   Basic Metabolic Panel Recent Labs    03/12/19 0208 03/13/19 0213  NA 128* 130*  K 4.0 3.8  CL 94* 93*  CO2 19* 21*  GLUCOSE 150* 209*  BUN 153* 106*  CREATININE 5.06* 3.95*  CALCIUM 7.0* 7.0*  PHOS 7.3* 6.3*   Liver Function Tests Recent Labs    03/12/19 0208 03/13/19 0213  ALBUMIN 2.1* 2.3*   No results for input(s): LIPASE, AMYLASE in the last 72 hours. Cardiac Enzymes No results for input(s): CKTOTAL, CKMB, CKMBINDEX, TROPONINI in the last 72 hours.  BNP: BNP (last 3 results) Recent Labs    03/07/19 2101  BNP 1,072.0*    ProBNP (last 3 results) No results for input(s): PROBNP in the last 8760 hours.   D-Dimer No results for input(s): DDIMER in the last 72 hours. Hemoglobin A1C No results for input(s): HGBA1C in the last 72 hours. Fasting Lipid Panel No results for input(s): CHOL, HDL, LDLCALC, TRIG, CHOLHDL, LDLDIRECT in the last 72 hours. Thyroid Function  Tests No results for input(s): TSH, T4TOTAL, T3FREE, THYROIDAB in the last 72 hours.  Invalid input(s): FREET3  Other results:   Imaging    IR Fluoro Guide CV Line Right  Result Date: 03/12/2019 INDICATION: 59 year old female with acute kidney injury in need of hemodialysis. She presents for placement of a temporary hemodialysis catheter. EXAM: IR ULTRASOUND GUIDANCE VASC ACCESS RIGHT; IR RIGHT FLUORO GUIDE CV LINE MEDICATIONS: None. ANESTHESIA/SEDATION: None. FLUOROSCOPY TIME:  Fluoroscopy Time: 0 minutes 30 seconds (2.5 mGy). COMPLICATIONS: None immediate. PROCEDURE: Informed written consent was obtained from the patient after a thorough discussion of the  procedural risks, benefits and alternatives. All questions were addressed. Maximal Sterile Barrier Technique was utilized including caps, mask, sterile gowns, sterile gloves, sterile drape, hand hygiene and skin antiseptic. A timeout was performed prior to the initiation of the procedure. The right internal jugular vein was interrogated with ultrasound and found to be widely patent. An image was obtained and stored for the medical record. Local anesthesia was attained by infiltration with 1% lidocaine. A small dermatotomy was made. Under real-time sonographic guidance, the vessel was punctured with a 21 gauge micropuncture needle. Using standard technique, the initial micro needle was exchanged over a 0.018 micro wire for a transitional 4 [place] micro sheath. The micro sheath was then exchanged over a 0.035 wire for a fascial dilator. The soft tissue tract was then dilated. A 16 cm triple-lumen temporary hemodialysis catheter was then advanced over the wire and positioned with the catheter tip in the upper right atrium. Catheter flushes and aspirates easily. The catheter was flushed with heparinized saline, capped and secured to the skin with 0 Prolene suture. A sterile bandage was applied. IMPRESSION: Successful placement of a right IJ approach 16 cm non tunneled triple-lumen hemodialysis catheter. The catheter tips are in the upper right atrium and the catheter is ready for immediate use. Signed, Criselda Peaches, MD, [facility] Radiology Electronically Signed   By: Jacqulynn Cadet M.D.   On: 03/12/2019 16:20   IR US Guide Vasc Access Right  Result Date: 03/12/2019 INDICATION: 59 year old female with acute kidney injury in need of hemodialysis. She presents for placement of a temporary hemodialysis catheter. EXAM: IR ULTRASOUND GUIDANCE VASC ACCESS RIGHT; IR RIGHT FLUORO GUIDE CV LINE MEDICATIONS: None. ANESTHESIA/SEDATION: None. FLUOROSCOPY TIME:   Fluoroscopy Time: 0 minutes 30 seconds (2.5 mGy). COMPLICATIONS: None immediate. PROCEDURE: Informed written consent was obtained from the patient after a thorough discussion of the procedural risks, benefits and alternatives. All questions were addressed. Maximal Sterile Barrier Technique was utilized including caps, mask, sterile gowns, sterile gloves, sterile drape, hand hygiene and skin antiseptic. A timeout was performed prior to the initiation of the procedure. The right internal jugular vein was interrogated with ultrasound and found to be widely patent. An image was obtained and stored for the medical record. Local anesthesia was attained by infiltration with 1% lidocaine. A small dermatotomy was made. Under real-time sonographic guidance, the vessel was punctured with a 21 gauge micropuncture needle. Using standard technique, the initial micro needle was exchanged over a 0.018 micro wire for a transitional 4 [place] micro sheath. The micro sheath was then exchanged over a 0.035 wire for a fascial dilator. The soft tissue tract was then dilated. A 16 cm triple-lumen temporary hemodialysis catheter was then advanced over the wire and positioned with the catheter tip in the upper right atrium. Catheter flushes and aspirates easily. The catheter was flushed with heparinized saline, capped  and secured to the skin with 0 Prolene suture. A sterile bandage was applied. IMPRESSION: Successful placement of a right IJ approach 16 cm non tunneled triple-lumen hemodialysis catheter. The catheter tips are in the upper right atrium and the catheter is ready for immediate use. Signed, Criselda Peaches, MD, [place] [facility] Radiology Electronically Signed   By: Jacqulynn Cadet M.D.   On: 03/12/2019 16:20   VAS [place] LOWER EXTREMITY VENOUS (DVT)  Result Date: 03/13/2019  Lower Venous Study Indications: Pain.  Limitations: Pain tolerance. Comparison Study: no prior  Performing Technologist: Abram Sander RVS  Examination Guidelines: A complete evaluation includes B-mode imaging, spectral Doppler, color Doppler, and power Doppler as needed of all accessible portions of each vessel. Bilateral testing is considered an integral part of a complete examination. Limited examinations for reoccurring indications may be performed as noted.  +---------+---------------+---------+-----------+----------+-------------------+ RIGHT    CompressibilityPhasicitySpontaneityPropertiesThrombus Aging      +---------+---------------+---------+-----------+----------+-------------------+ CFV      Full           Yes      Yes                                      +---------+---------------+---------+-----------+----------+-------------------+ SFJ      Full                                                             +---------+---------------+---------+-----------+----------+-------------------+ FV Prox  Full                                                             +---------+---------------+---------+-----------+----------+-------------------+ FV Mid                  Yes      Yes                  unable to tolerate                                                        compression         +---------+---------------+---------+-----------+----------+-------------------+ FV Distal               Yes      Yes                  unable to tolerate                                                        compression         +---------+---------------+---------+-----------+----------+-------------------+ PFV      Full                                                             +---------+---------------+---------+-----------+----------+-------------------+  POP      Full           Yes      Yes                                      +---------+---------------+---------+-----------+----------+-------------------+ PTV      Full                                                              +---------+---------------+---------+-----------+----------+-------------------+ PERO     Full                                                             +---------+---------------+---------+-----------+----------+-------------------+   +----+---------------+---------+-----------+----------+--------------+ LEFTCompressibilityPhasicitySpontaneityPropertiesThrombus Aging +----+---------------+---------+-----------+----------+--------------+ CFV Full           Yes                                          +----+---------------+---------+-----------+----------+--------------+     Summary: Right: There is no evidence of deep vein thrombosis in the lower extremity. However, portions of this examination were limited- see technologist comments above. No cystic structure found in the popliteal fossa. Left: No evidence of common femoral vein obstruction.  *See table(s) above for measurements and observations.    Preliminary      Medications:     Scheduled Medications: . calcitRIOL  0.5 mcg Oral Daily  . Chlorhexidine Gluconate Cloth  6 each Topical Q0600  . Chlorhexidine Gluconate Cloth  6 each Topical Q0600  . cholecalciferol  1,000 Units Oral Daily  . insulin aspart  0-9 Units Subcutaneous TID WC  . lidocaine  10 mL Intradermal Once  . midodrine  10 mg Oral TID WC  . sodium chloride flush  3 mL Intravenous Q12H  . sodium chloride flush  3 mL Intravenous Q12H  . vitamin B-12  100 mcg Oral Daily    Infusions: . sodium chloride 10 mL/hr at 03/12/19 1700  . DOBUTamine 2.5 mcg/kg/min (03/13/19 0300)  . furosemide 160 mg (03/13/19 0859)  . heparin      PRN Medications: sodium chloride, acetaminophen **OR** acetaminophen, fentaNYL (SUBLIMAZE) injection, heparin, lidocaine, ondansetron **OR** ondansetron (ZOFRAN) IV, sodium chloride flush, traMADol    Assessment/Plan   1. Acute on chronic systolic CHF: Longstanding NICM.  Last echo in  2/19 with EF 15-20%. She has a [employer] CRT-D device. Admitted with progressive dyspnea and 30 lb weight gain. She is very volume overloaded on exam, complicated by SBP in 31D and AKI on CKD stage 4. Empirically started on dobutamine 2.5 due to concern for low output. RHC showed R>L heart failure with low PAPI adequate cardiac output on dobutamine.  She had HD on 1/15, tolerated well.  Still making urine with high dose IV Lasix.  She is still volume overloaded on exam.  - Continue dobutamine 2.5 mcg/kg/min.   - Midodrine 10 mg tid with relative hypotension.  - Lasix 160  mg IV every 8 hrs to continue while she is making urine.  - Plan for HD again today for volume removal.  - Stopped digoxin and benazepril with AKI.  - Stopped beta blocker with concern for low output.  - No BP room for hydralazine/nitrates.  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  2. AKI on CKD stage 4: Baseline creatinine around 3, now up to 5 with BUN 150.  Not overtly uremic this morning (no confusion).  Suspect cardiorenal syndrome superimposed on possible diabetic nephropathy.  She is unfortunately very volume overloaded as well.  Some response to high dose IV Lasix boluses. She had 1st HD on 1/15.  - HD again today, will also continue Lasix.  Will have to see if she will need long-term HD.  3. Chronic atrial fibrillation: Warfarin held for procedures, INR 2.1.  Can cover with heparin gtt when INR < 2.  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  5. DM: Per primary.  6. Monoclonal gammopathy:  LV morphology does not look concerning for AL amyloidosis. She has been seen by hematology, has had bone marrow bx and awaiting results.   7. Tricuspid regurgitation: Severe by echo.   Length of Stay: [place], MD  03/13/2019, 9:06 AM  Advanced Heart Failure Team Pager [phone] (M-F; 7a - 4p)  Please contact [facility] for night-coverage after hours (4p -7a  ) and weekends on [email or web address]

## 2019-03-13 NOTE — Progress Notes (Signed)
ANTICOAGULATION CONSULT NOTE - [place] for Coumadin to heparin Indication: atrial fibrillation  Allergies  Allergen Reactions  . Wheat Swelling  . Latex Rash  . Penicillins Rash  . Sulfa Antibiotics Rash    Patient Measurements: Height: 5\' 6"  (167.6 cm) Weight: 191 lb 5.8 oz (86.8 kg)(said that she can't get up.) IBW/kg (Calculated) : 59.3  Vital Signs: Temp: 98 F (36.7 C) (01/16 0336) Temp Source: Oral (01/16 0336) BP: 106/58 (01/16 0336) Pulse Rate: 59 (01/16 0336)  Labs: Recent Labs    03/11/19 0628 03/11/19 0628 03/12/19 0208 03/13/19 0213  HGB 10.1*   < > 9.5* 9.6*  HCT 29.6*  --  27.6* 27.8*  PLT 139*  --  141* 144*  LABPROT 26.0*  --  26.9* 23.7*  INR 2.4*  --  2.5* 2.1*  CREATININE 5.07*  --  5.06* 3.95*   < > = values in this interval not displayed.    Estimated Creatinine Clearance: 17 mL/min (A) (by C-G formula based on SCr of 3.95 mg/dL (H)).   Medical History: Past Medical History:  Diagnosis Date  . Automatic implantable cardioverter-defibrillator in situ    a. 03/2013: BSX [employer] CRTD BiV ICC, ser# [id number]  . Chronic anticoagulation    a. coumadin.  . Chronic atrial fibrillation (HCC)    embolic rind  . Chronic kidney disease    creatinine- 1.8 in 09/2006 and 2.0 in 05/2007; 2.05 in 2011, 2.29 in 2012  . Chronic systolic CHF (congestive heart failure) ([place])    a. 03/2013 Echo: Sev LV dysfxn with sev diff HK, restrictive phys, diast dysfxn, mild MR, sev dil LA/RA, mod PR, PASP 67mmHg.  . Congenital third degree heart block    a. Guidant VVI pacemaker implanted in 09/1997; b. gen change in 01/2004;  c. 03/2013 upgrade to [place], ser# [id number].  . Dizziness and giddiness 05/19/2012  . Dysphagia 08/14/2011   FEB 2013 EGD/DIL 16 MM; GERD; h/o gastroesophageal reflux disease; + H. Pylori gastritis   . GERD (gastroesophageal reflux disease)   . Helicobacter pylori gastritis 08/14/2011  . IDDM (insulin  dependent diabetes mellitus)   . Kidney stones 1990's  . Nonischemic cardiomyopathy ([place])    a. 03/2013 Echo: Sev LV dysfxn with sev diff HK, restrictive phys, diast dysfxn, mild MR, sev dil LA/RA, mod PR, PASP 36mmHg;  b. 03/2013: BSX [employer] CRTD BiV ICC, ser# [id number].  Marland Kitchen Potassium (K) excess   . Stroke [facility]) 1999   denies residual on 04/21/2013     Assessment: 42 yoF on warfarin PTA for hx AFib admitted with acute CHF.   INR trending down 2.5>2.1 this morning.  Hgb stable in 9s - may need HD lines or further procedures so will continue to hold warfarin. Expect INR to continue to trend down so will start heparin tonight.  Goal of Therapy:  Heparin level goal 0.3-0.7 INR 2-3 Monitor platelets by anticoagulation protocol: Yes   Plan:  -Hold warfarin in case procedures are needed -Start heparin tonight - 1200 units/hr -Daily heparin level, INR, CBC  Erin Hearing PharmD., BCPS Clinical Pharmacist 03/13/2019 8:18 AM

## 2019-03-14 LAB — GLUCOSE, CAPILLARY
Glucose-Capillary: 149 mg/dL — ABNORMAL HIGH (ref 70–99)
Glucose-Capillary: 230 mg/dL — ABNORMAL HIGH (ref 70–99)
Glucose-Capillary: 183 mg/dL — ABNORMAL HIGH (ref 70–99)
Glucose-Capillary: 168 mg/dL — ABNORMAL HIGH (ref 70–99)

## 2019-03-14 LAB — RENAL FUNCTION PANEL
Albumin: 2.4 g/dL — ABNORMAL LOW (ref 3.5–5.0)
Anion gap: 12 (ref 5–15)
BUN: 65 mg/dL — ABNORMAL HIGH (ref 6–20)
CO2: 24 mmol/L (ref 22–32)
Calcium: 7.4 mg/dL — ABNORMAL LOW (ref 8.9–10.3)
Chloride: 95 mmol/L — ABNORMAL LOW (ref 98–111)
Creatinine, Ser: 3.18 mg/dL — ABNORMAL HIGH (ref 0.44–1.00)
GFR calc Af Amer: 18 mL/min — ABNORMAL LOW (ref >60)
GFR calc non Af Amer: 15 mL/min — ABNORMAL LOW (ref >60)
Glucose, Bld: 173 mg/dL — ABNORMAL HIGH (ref 70–99)
Phosphorus: 4.8 mg/dL — ABNORMAL HIGH (ref 2.5–4.6)
Potassium: 3.9 mmol/L (ref 3.5–5.1)
Sodium: 131 mmol/L — ABNORMAL LOW (ref 135–145)

## 2019-03-14 LAB — CBC
HCT: 28 % — ABNORMAL LOW (ref 36.0–46.0)
Hemoglobin: 9.6 g/dL — ABNORMAL LOW (ref 12.0–15.0)
MCH: 27.7 pg (ref 26.0–34.0)
MCHC: 34.3 g/dL (ref 30.0–36.0)
MCV: 80.7 fL (ref 80.0–100.0)
Platelets: 128 10*3/uL — ABNORMAL LOW (ref 150–400)
RBC: 3.47 MIL/uL — ABNORMAL LOW (ref 3.87–5.11)
RDW: 16.3 % — ABNORMAL HIGH (ref 11.5–15.5)
WBC: 9.2 10*3/uL (ref 4.0–10.5)
nRBC: 0.5 % — ABNORMAL HIGH (ref 0.0–0.2)

## 2019-03-14 LAB — PROTIME-INR
INR: 2 — ABNORMAL HIGH (ref 0.8–1.2)
Prothrombin Time: 22.9 seconds — ABNORMAL HIGH (ref 11.4–15.2)

## 2019-03-14 LAB — HEPARIN LEVEL (UNFRACTIONATED)
Heparin Unfractionated: <0.1 IU/mL — ABNORMAL LOW (ref 0.30–0.70)
Heparin Unfractionated: 0.23 IU/mL — ABNORMAL LOW (ref 0.30–0.70)

## 2019-03-14 MED ORDER — METOLAZONE 2.5 MG PO TABS
2.5000 mg | ORAL_TABLET | Freq: Two times a day (BID) | ORAL | Status: DC
  Administered 2019-03-14 – 2019-03-17 (×7): 2.5 mg via ORAL
  Filled 2019-03-14 (×7): qty 1

## 2019-03-14 MED ORDER — SODIUM CHLORIDE 0.9 % IV SOLN
125.0000 mg | Freq: Every day | INTRAVENOUS | Status: AC
  Administered 2019-03-14 – 2019-03-17 (×4): 125 mg via INTRAVENOUS
  Filled 2019-03-14 (×4): qty 10

## 2019-03-14 NOTE — Progress Notes (Signed)
Patient ID: [name], female   DOB: [birth year], 60 y.o.   MRN: [id number]     Advanced Heart Failure Rounding Note  PCP-Cardiologist: Kate Sable, MD   Subjective:    Dobutamine 2.5 started empirically on 1/11.  She is getting high dose IV Lasix, still making urine but not all recorded yesterday.  She had 2nd HD yesterday, no problems. Now on midodrine 10 mg tid.  Weight down 6 lbs.  Still with leg pain bilaterally, she thinks this is from swelling.  No dyspnea.   Echo: EF < 20%, no LVH, mildly reduced RV systolic function, severe biatrial enlargement, severe TR.  RHC Procedural Findings (dobutamine 2.5 mcg/kg/min): Hemodynamics (mmHg) RA 18 RV 43/18 PA 45/19, mean 28 PCWP mean 19 Oxygen saturations: PA 63% AO 94% Cardiac Output (Fick) 6.1  Cardiac Index (Fick) 3.06 PVR 1.5 WU PAPI 1.4   Objective:   Weight Range: 85.9 kg Body mass index is 30.57 kg/m.   Vital Signs:   Temp:  [97.6 F (36.4 C)-98.6 F (37 C)] 97.8 F (36.6 C) (01/17 0807) Pulse Rate:  [55-60] 58 (01/17 0808) Resp:  [14-23] 18 (01/17 0808) BP: (89-109)/(48-62) 93/53 (01/17 0808) SpO2:  [94 %-100 %] 95 % (01/17 0808) Weight:  [84.8 kg-86.2 kg] 85.9 kg (01/17 0450) Last BM Date: 03/13/19  Weight change: Filed Weights   03/13/19 1000 03/13/19 1231 03/14/19 0450  Weight: 86.2 kg 84.8 kg 85.9 kg    Intake/Output:   Intake/Output Summary (Last 24 hours) at 03/14/2019 0838 Last data filed at 03/14/2019 0727 Gross per 24 hour  Intake 1754.52 ml  Output 2000 ml  Net -245.48 ml      Physical Exam    General: NAD Neck: JVP 12 cm, no thyromegaly or thyroid nodule.  Lungs: Clear to auscultation bilaterally with normal respiratory effort. CV: Nondisplaced PMI.  Heart regular S1/S2, 3/6 HSM LLSB. 1+ edema 1/2 to knees.  No carotid bruit.  Normal pedal pulses.  Abdomen: Soft, nontender, no hepatosplenomegaly, no distention.  Skin: Intact without lesions or rashes.  Neurologic: Alert and  oriented x 3.  Psych: Normal affect. Extremities: No clubbing or cyanosis.  HEENT: Normal.    Telemetry   Atrial fibrillation with BiV pacing, rate 60s (personally reviewed)  Labs    CBC Recent Labs    03/12/19 0208 03/12/19 0208 03/13/19 0213 03/14/19 0231  WBC 7.2   < > 8.8 9.2  NEUTROABS 5.7  --  7.5  --   HGB 9.5*   < > 9.6* 9.6*  HCT 27.6*   < > 27.8* 28.0*  MCV 80.0   < > 79.7* 80.7  PLT 141*   < > 144* 128*   < > = values in this interval not displayed.   Basic Metabolic Panel Recent Labs    03/13/19 0213 03/14/19 0231  NA 130* 131*  K 3.8 3.9  CL 93* 95*  CO2 21* 24  GLUCOSE 209* 173*  BUN 106* 65*  CREATININE 3.95* 3.18*  CALCIUM 7.0* 7.4*  PHOS 6.3* 4.8*   Liver Function Tests Recent Labs    03/13/19 0213 03/14/19 0231  ALBUMIN 2.3* 2.4*   No results for input(s): LIPASE, AMYLASE in the last 72 hours. Cardiac Enzymes No results for input(s): CKTOTAL, CKMB, CKMBINDEX, TROPONINI in the last 72 hours.  BNP: BNP (last 3 results) Recent Labs    03/07/19 2101  BNP 1,072.0*    ProBNP (last 3 results) No results for input(s): PROBNP in the last 8760  hours.   D-Dimer No results for input(s): DDIMER in the last 72 hours. Hemoglobin A1C No results for input(s): HGBA1C in the last 72 hours. Fasting Lipid Panel No results for input(s): CHOL, HDL, LDLCALC, TRIG, CHOLHDL, LDLDIRECT in the last 72 hours. Thyroid Function Tests No results for input(s): TSH, T4TOTAL, T3FREE, THYROIDAB in the last 72 hours.  Invalid input(s): FREET3  Other results:   Imaging    VAS [place] LOWER EXTREMITY VENOUS (DVT)  Result Date: 03/13/2019  Lower Venous Study Indications: Pain.  Limitations: Pain tolerance. Comparison Study: no prior Performing Technologist: Abram Sander RVS  Examination Guidelines: A complete evaluation includes B-mode imaging, spectral Doppler, color Doppler, and power Doppler as needed of all accessible portions of each vessel. Bilateral  testing is considered an integral part of a complete examination. Limited examinations for reoccurring indications may be performed as noted.  +---------+---------------+---------+-----------+----------+-------------------+ RIGHT    CompressibilityPhasicitySpontaneityPropertiesThrombus Aging      +---------+---------------+---------+-----------+----------+-------------------+ CFV      Full           Yes      Yes                                      +---------+---------------+---------+-----------+----------+-------------------+ SFJ      Full                                                             +---------+---------------+---------+-----------+----------+-------------------+ FV Prox  Full                                                             +---------+---------------+---------+-----------+----------+-------------------+ FV Mid                  Yes      Yes                  unable to tolerate                                                        compression         +---------+---------------+---------+-----------+----------+-------------------+ FV Distal               Yes      Yes                  unable to tolerate                                                        compression         +---------+---------------+---------+-----------+----------+-------------------+ PFV      Full                                                             +---------+---------------+---------+-----------+----------+-------------------+  POP      Full           Yes      Yes                                      +---------+---------------+---------+-----------+----------+-------------------+ PTV      Full                                                             +---------+---------------+---------+-----------+----------+-------------------+ PERO     Full                                                              +---------+---------------+---------+-----------+----------+-------------------+   +----+---------------+---------+-----------+----------+--------------+ LEFTCompressibilityPhasicitySpontaneityPropertiesThrombus Aging +----+---------------+---------+-----------+----------+--------------+ CFV Full           Yes                                          +----+---------------+---------+-----------+----------+--------------+     Summary: Right: There is no evidence of deep vein thrombosis in the lower extremity. However, portions of this examination were limited- see technologist comments above. No cystic structure found in the popliteal fossa. Left: No evidence of common femoral vein obstruction.  *See table(s) above for measurements and observations. Electronically signed by Monica Martinez MD on 03/13/2019 at 1:49:39 PM.    Final      Medications:     Scheduled Medications: . calcitRIOL  0.5 mcg Oral Daily  . Chlorhexidine Gluconate Cloth  6 each Topical Q0600  . Chlorhexidine Gluconate Cloth  6 each Topical Q0600  . cholecalciferol  1,000 Units Oral Daily  . insulin aspart  0-9 Units Subcutaneous TID WC  . lidocaine  10 mL Intradermal Once  . metolazone  2.5 mg Oral BID  . midodrine  10 mg Oral TID WC  . sodium chloride flush  3 mL Intravenous Q12H  . sodium chloride flush  3 mL Intravenous Q12H  . vitamin B-12  100 mcg Oral Daily    Infusions: . sodium chloride 10 mL/hr at 03/14/19 0727  . DOBUTamine 2.5 mcg/kg/min (03/14/19 0727)  . ferric gluconate (FERRLECIT/NULECIT) IV    . furosemide 66 mL/hr at 03/14/19 0727  . heparin 1,350 Units/hr (03/14/19 0727)    PRN Medications: sodium chloride, acetaminophen **OR** acetaminophen, fentaNYL (SUBLIMAZE) injection, heparin, lidocaine, ondansetron **OR** ondansetron (ZOFRAN) IV, sodium chloride, sodium chloride flush, traMADol    Assessment/Plan   1. Acute on chronic systolic CHF: Longstanding NICM.  Last echo in 2/19 with  EF 15-20%. She has a [employer] CRT-D device. Admitted with progressive dyspnea and 30 lb weight gain. She is very volume overloaded on exam, complicated by SBP in 60Y and AKI on CKD stage 4. Empirically started on dobutamine 2.5 due to concern for low output. RHC showed R>L heart failure with low PAPI adequate cardiac output on dobutamine.  She had HD on 1/15 and 1/16, tolerated well.  Still making  urine with high dose IV Lasix though not all UOP recorded yesterday.  She is still volume overloaded on exam.  - Continue dobutamine 2.5 mcg/kg/min.   - Midodrine 10 mg tid with relative hypotension.  - Lasix 160 mg IV every 8 hrs to continue while she is making urine. Metolazone 2.5 mg bid added by nephrology today.  - Hold HD for now to see if she will respond to Lasix + metolazone.  - Stopped digoxin and benazepril with AKI.  - Stopped beta blocker with concern for low output.  - No BP room for hydralazine/nitrates.  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  2. AKI on CKD stage 4: Baseline creatinine around 3, now up to 5 with BUN 150.  Not overtly uremic this morning (no confusion).  Suspect cardiorenal syndrome superimposed on possible diabetic nephropathy.  She is unfortunately very volume overloaded as well.  Some response to high dose IV Lasix boluses. She had 2nd HD on 1/16.  - Hold HD for now, continue Lasix IV + metolazone today to see if she can stabilize off HD.   3. Chronic atrial fibrillation: Warfarin held for procedures, INR 2.0.  Can cover with heparin gtt with INR < 2.  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  5. DM: Per primary.  6. Monoclonal gammopathy:  LV morphology does not look concerning for AL amyloidosis. She has been seen by hematology, has had bone marrow bx and awaiting results.   7. Tricuspid regurgitation: Severe by echo.   Length of Stay: 6  Loralie Champagne, MD  03/14/2019, 8:38 AM  Advanced Heart Failure  Team Pager [phone] (M-F; 7a - 4p)  Please contact [facility] for night-coverage after hours (4p -7a ) and weekends on [email or web address]

## 2019-03-14 NOTE — Progress Notes (Signed)
Subjective:  Underwent second HD yest-  Removed 1.5  liter. Also made 500 of urine but looks like missed a shift- does feel better   Objective Vital signs in last 24 hours: Vitals:   03/13/19 2319 03/14/19 0000 03/14/19 0400 03/14/19 0450  BP: (!) 100/53   (!) 93/57  Pulse: (!) 59 (!) 57 (!) 59 (!) 59  Resp: (!) 23 18 (!) 21 19  Temp: 98.2 F (36.8 C)   98.1 F (36.7 C)  TempSrc: Oral   Oral  SpO2:      Weight:    85.9 kg  Height:       Weight change: -4.7 kg  Intake/Output Summary (Last 24 hours) at 03/14/2019 0730 Last data filed at 03/14/2019 1194 Gross per 24 hour  Intake 1754.52 ml  Output 2000 ml  Net -245.48 ml    Assessment/ Plan: Pt is a 60 y.o. yo female with nonischemic cardiomyopathy/MGUS/DM and baseline CKD (crt 3's)  who was admitted on 03/07/2019 with massive volume overload and crt 5  Assessment/Plan: 1. Renal-  A on CRF.  Had advanced CKD at baseline. Suspect cardiorenal vs DM bu also has M spike.  Per cards-  Heart does not look like amyloid.  Onc has seen, s/p bone marrow biopsy- results pending.   Now with some acute change vs progression in the setting of massive volume overload.  Definitely has the pathology of cardiorenal process.  "Failed" a trial of dobutamine and high dose lasix.  S/p vascath and first HD 1/15-  Second 1/16.   Still unclear if she will remain HD dep.  Will hold HD and follow labs/uop on medical reg 2. Vol/htn-  Very volume overloaded and relatively hypotensive-  Attempting dobutamine and lasix /  midodrine as well.  supp HD Fri and Sat for additional removal. I am going to add on some metolazone today to see if can achieve some true diuresis  3. Anemia- not a major issue at this point which argues against myeloma as does low calcium-  S/p bone marrow biopsy- results pending.  Iron low-  Will give IV iron 4. Secondary hyperparathyroidism- is on calcitriol-  Will cont - phos 7.2-  Should come down some with HD- no binder yet 5. Hyperkalemia-   Resolved    6. Hyponatremia-  Likely hypervolemic hyponatremia-  Improving as volume does  [name]    Labs: Basic Metabolic Panel: Recent Labs  Lab 03/12/19 0208 03/13/19 0213 03/14/19 0231  NA 128* 130* 131*  K 4.0 3.8 3.9  CL 94* 93* 95*  CO2 19* 21* 24  GLUCOSE 150* 209* 173*  BUN 153* 106* 65*  CREATININE 5.06* 3.95* 3.18*  CALCIUM 7.0* 7.0* 7.4*  PHOS 7.3* 6.3* 4.8*   Liver Function Tests: Recent Labs  Lab 03/07/19 2101 03/07/19 2101 03/08/19 0927 03/10/19 0806 03/12/19 0208 03/13/19 0213 03/14/19 0231  AST 23  --  24  --   --   --   --   ALT 17  --  16  --   --   --   --   ALKPHOS 324*  --  303*  --   --   --   --   BILITOT 3.0*  --  3.3*  --   --   --   --   PROT 7.7  --  7.3  --   --   --   --   ALBUMIN 2.9*   < > 2.7*   < >  2.1* 2.3* 2.4*   < > = values in this interval not displayed.   No results for input(s): LIPASE, AMYLASE in the last 168 hours. No results for input(s): AMMONIA in the last 168 hours. CBC: Recent Labs  Lab 03/10/19 0806 03/10/19 0806 03/11/19 0628 03/11/19 0628 03/12/19 0208 03/13/19 0213 03/14/19 0231  WBC 6.3   < > 6.8   < > 7.2 8.8 9.2  NEUTROABS 5.0   < > 5.5  --  5.7 7.5  --   HGB 9.8*   < > 10.1*   < > 9.5* 9.6* 9.6*  HCT 29.1*   < > 29.6*   < > 27.6* 27.8* 28.0*  MCV 80.8  --  80.7  --  80.0 79.7* 80.7  PLT 127*   < > 139*   < > 141* 144* 128*   < > = values in this interval not displayed.   Cardiac Enzymes: No results for input(s): CKTOTAL, CKMB, CKMBINDEX, TROPONINI in the last 168 hours. CBG: Recent Labs  Lab 03/13/19 0901 03/13/19 1308 03/13/19 1551 03/13/19 2057 03/14/19 0649  GLUCAP 211* 143* 210* 202* 149*    Iron Studies:  No results for input(s): IRON, TIBC, TRANSFERRIN, FERRITIN in the last 72 hours. Studies/Results: IR Fluoro Guide CV Line Right  Result Date: 03/12/2019 INDICATION: 59 year old female with acute kidney injury in need of hemodialysis. She presents for placement of  a temporary hemodialysis catheter. EXAM: IR ULTRASOUND GUIDANCE VASC ACCESS RIGHT; IR RIGHT FLUORO GUIDE CV LINE MEDICATIONS: None. ANESTHESIA/SEDATION: None. FLUOROSCOPY TIME:  Fluoroscopy Time: 0 minutes 30 seconds (2.5 mGy). COMPLICATIONS: None immediate. PROCEDURE: Informed written consent was obtained from the patient after a thorough discussion of the procedural risks, benefits and alternatives. All questions were addressed. Maximal Sterile Barrier Technique was utilized including caps, mask, sterile gowns, sterile gloves, sterile drape, hand hygiene and skin antiseptic. A timeout was performed prior to the initiation of the procedure. The right internal jugular vein was interrogated with ultrasound and found to be widely patent. An image was obtained and stored for the medical record. Local anesthesia was attained by infiltration with 1% lidocaine. A small dermatotomy was made. Under real-time sonographic guidance, the vessel was punctured with a 21 gauge micropuncture needle. Using standard technique, the initial micro needle was exchanged over a 0.018 micro wire for a transitional 4 [place] micro sheath. The micro sheath was then exchanged over a 0.035 wire for a fascial dilator. The soft tissue tract was then dilated. A 16 cm triple-lumen temporary hemodialysis catheter was then advanced over the wire and positioned with the catheter tip in the upper right atrium. Catheter flushes and aspirates easily. The catheter was flushed with heparinized saline, capped and secured to the skin with 0 Prolene suture. A sterile bandage was applied. IMPRESSION: Successful placement of a right IJ approach 16 cm non tunneled triple-lumen hemodialysis catheter. The catheter tips are in the upper right atrium and the catheter is ready for immediate use. Signed, Criselda Peaches, MD, [facility] Electronically Signed   By: Jacqulynn Cadet M.D.   On:  03/12/2019 16:20   IR US Guide Vasc Access Right  Result Date: 03/12/2019 INDICATION: 59 year old female with acute kidney injury in need of hemodialysis. She presents for placement of a temporary hemodialysis catheter. EXAM: IR ULTRASOUND GUIDANCE VASC ACCESS RIGHT; IR RIGHT FLUORO GUIDE CV LINE MEDICATIONS: None. ANESTHESIA/SEDATION: None. FLUOROSCOPY TIME:  Fluoroscopy Time: 0 minutes 30 seconds (2.5 mGy). COMPLICATIONS: None  immediate. PROCEDURE: Informed written consent was obtained from the patient after a thorough discussion of the procedural risks, benefits and alternatives. All questions were addressed. Maximal Sterile Barrier Technique was utilized including caps, mask, sterile gowns, sterile gloves, sterile drape, hand hygiene and skin antiseptic. A timeout was performed prior to the initiation of the procedure. The right internal jugular vein was interrogated with ultrasound and found to be widely patent. An image was obtained and stored for the medical record. Local anesthesia was attained by infiltration with 1% lidocaine. A small dermatotomy was made. Under real-time sonographic guidance, the vessel was punctured with a 21 gauge micropuncture needle. Using standard technique, the initial micro needle was exchanged over a 0.018 micro wire for a transitional 4 [place] micro sheath. The micro sheath was then exchanged over a 0.035 wire for a fascial dilator. The soft tissue tract was then dilated. A 16 cm triple-lumen temporary hemodialysis catheter was then advanced over the wire and positioned with the catheter tip in the upper right atrium. Catheter flushes and aspirates easily. The catheter was flushed with heparinized saline, capped and secured to the skin with 0 Prolene suture. A sterile bandage was applied. IMPRESSION: Successful placement of a right IJ approach 16 cm non tunneled triple-lumen hemodialysis catheter. The catheter tips are in the upper right atrium and the catheter is ready for  immediate use. Signed, Criselda Peaches, MD, [facility] Electronically Signed   By: Jacqulynn Cadet M.D.   On: 03/12/2019 16:20   VAS [place] LOWER EXTREMITY VENOUS (DVT)  Result Date: 03/13/2019  Lower Venous Study Indications: Pain.  Limitations: Pain tolerance. Comparison Study: no prior Performing Technologist: Abram Sander RVS  Examination Guidelines: A complete evaluation includes B-mode imaging, spectral Doppler, color Doppler, and power Doppler as needed of all accessible portions of each vessel. Bilateral testing is considered an integral part of a complete examination. Limited examinations for reoccurring indications may be performed as noted.  +---------+---------------+---------+-----------+----------+-------------------+ RIGHT    CompressibilityPhasicitySpontaneityPropertiesThrombus Aging      +---------+---------------+---------+-----------+----------+-------------------+ CFV      Full           Yes      Yes                                      +---------+---------------+---------+-----------+----------+-------------------+ SFJ      Full                                                             +---------+---------------+---------+-----------+----------+-------------------+ FV Prox  Full                                                             +---------+---------------+---------+-----------+----------+-------------------+ FV Mid                  Yes      Yes                  unable to tolerate  compression         +---------+---------------+---------+-----------+----------+-------------------+ FV Distal               Yes      Yes                  unable to tolerate                                                        compression         +---------+---------------+---------+-----------+----------+-------------------+  PFV      Full                                                             +---------+---------------+---------+-----------+----------+-------------------+ POP      Full           Yes      Yes                                      +---------+---------------+---------+-----------+----------+-------------------+ PTV      Full                                                             +---------+---------------+---------+-----------+----------+-------------------+ PERO     Full                                                             +---------+---------------+---------+-----------+----------+-------------------+   +----+---------------+---------+-----------+----------+--------------+ LEFTCompressibilityPhasicitySpontaneityPropertiesThrombus Aging +----+---------------+---------+-----------+----------+--------------+ CFV Full           Yes                                          +----+---------------+---------+-----------+----------+--------------+     Summary: Right: There is no evidence of deep vein thrombosis in the lower extremity. However, portions of this examination were limited- see technologist comments above. No cystic structure found in the popliteal fossa. Left: No evidence of common femoral vein obstruction.  *See table(s) above for measurements and observations. Electronically signed by Monica Martinez MD on 03/13/2019 at 1:49:39 PM.    Final    Medications: Infusions: . sodium chloride 10 mL/hr at 03/14/19 0727  . DOBUTamine 2.5 mcg/kg/min (03/14/19 0727)  . furosemide 66 mL/hr at 03/14/19 0727  . heparin 1,350 Units/hr (03/14/19 0727)    Scheduled Medications: . calcitRIOL  0.5 mcg Oral Daily  . Chlorhexidine Gluconate Cloth  6 each Topical Q0600  . Chlorhexidine Gluconate Cloth  6 each Topical Q0600  . cholecalciferol  1,000 Units Oral Daily  . insulin aspart  0-9 Units Subcutaneous TID WC  .  lidocaine  10 mL Intradermal Once  . midodrine   10 mg Oral TID WC  . sodium chloride flush  3 mL Intravenous Q12H  . sodium chloride flush  3 mL Intravenous Q12H  . vitamin B-12  100 mcg Oral Daily    have reviewed scheduled and prn medications.  Physical Exam: General: alert, pleasant-  Does feel better Heart: RRR Lungs: dec BS at bases Abdomen: soft, non tender Extremities: pitting edema throughout    03/14/2019,7:30 AM  LOS: 6 days

## 2019-03-14 NOTE — Progress Notes (Signed)
ANTICOAGULATION CONSULT NOTE - [place] for heparin Indication: atrial fibrillation  Allergies  Allergen Reactions  . Wheat Swelling  . Latex Rash  . Penicillins Rash  . Sulfa Antibiotics Rash    Patient Measurements: Height: 5\' 6"  (167.6 cm) Weight: 189 lb 6 oz (85.9 kg)(Refused to get up she said that her legs hurt to much) IBW/kg (Calculated) : 59.3  Vital Signs: Temp: 98.1 F (36.7 C) (01/17 0450) Temp Source: Oral (01/17 0450) BP: 93/57 (01/17 0450) Pulse Rate: 59 (01/17 0450)  Labs: Recent Labs    03/12/19 0208 03/12/19 0208 03/13/19 0213 03/14/19 0231  HGB 9.5*   < > 9.6* 9.6*  HCT 27.6*  --  27.8* 28.0*  PLT 141*  --  144* 128*  LABPROT 26.9*  --  23.7* 22.9*  INR 2.5*  --  2.1* 2.0*  HEPARINUNFRC  --   --   --  <0.10*  CREATININE 5.06*  --  3.95* 3.18*   < > = values in this interval not displayed.    Estimated Creatinine Clearance: 21 mL/min (A) (by C-G formula based on SCr of 3.18 mg/dL (H)).   Assessment: 59 yo female with hx AFib, Coumadin on hold, for heparin  Goal of Therapy:  Heparin level goal 0.3-0.7 INR 2-3 Monitor platelets by anticoagulation protocol: Yes   Plan:  Increase Heparin 1350 units/hr Check heparin level in 8 hours.  Phillis Knack, PharmD, BCPS  03/14/2019 4:59 AM

## 2019-03-14 NOTE — Progress Notes (Addendum)
ANTICOAGULATION CONSULT NOTE - [place] for heparin Indication: atrial fibrillation  Allergies  Allergen Reactions  . Wheat Swelling  . Latex Rash  . Penicillins Rash  . Sulfa Antibiotics Rash    Patient Measurements: Height: 5\' 6"  (167.6 cm) Weight: 189 lb 6 oz (85.9 kg)(Refused to get up she said that her legs hurt to much) IBW/kg (Calculated) : 59.3  Vital Signs: Temp: 97.8 F (36.6 C) (01/17 1141) Temp Source: Oral (01/17 0807) BP: 96/56 (01/17 1141) Pulse Rate: 59 (01/17 1141)  Labs: Recent Labs    03/12/19 0208 03/12/19 0208 03/13/19 0213 03/14/19 0231 03/14/19 1244  HGB 9.5*   < > 9.6* 9.6*  --   HCT 27.6*  --  27.8* 28.0*  --   PLT 141*  --  144* 128*  --   LABPROT 26.9*  --  23.7* 22.9*  --   INR 2.5*  --  2.1* 2.0*  --   HEPARINUNFRC  --   --   --  <0.10* 0.23*  CREATININE 5.06*  --  3.95* 3.18*  --    < > = values in this interval not displayed.    Estimated Creatinine Clearance: 21 mL/min (A) (by C-G formula based on SCr of 3.18 mg/dL (H)).   Assessment: 59 yo female with hx AFib, Coumadin on hold, continue heparin bridge.   INR down to 2.0 today. CBC stable. Afternoon heparin level below goal at 0.23.   Goal of Therapy:  Heparin level goal 0.3-0.7 INR 2-3 Monitor platelets by anticoagulation protocol: Yes   Plan:  Increase Heparin 1500 units/hr Check heparin level in am  Erin Hearing PharmD., BCPS Clinical Pharmacist 03/14/2019 1:38 PM

## 2019-03-14 NOTE — Progress Notes (Signed)
PROGRESS NOTE    [name]  QGB:[id number] DOB: Jan 12, 1961 DOA: 03/07/2019 PCP: [name], MD   Brief Narrative: 59 year old with past medical history significant for MGUS, thrombocytopenia, A. fib on Coumadin, CHB with pacemaker, nonischemic cardiomyopathy with ejection fraction 15 to 20% with ICD, CKD stage IV baseline creatinine 3.0, cirrhosis who presented with several weeks of progressive swelling about 30 pound weight gain.  Evaluation in the ED patient was found to have a creatinine more than 5, anasarca, hemoglobin of 11.  Chest x-ray was clear but she required 2 L of supplemental oxygen.  Patient received 40 mg of IV Lasix without significant urine output.  Patient was transferred to [facility] for heart failure team evaluation, patient was a started on inotrope.    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 Started on dobutamine drip. Continue with IV Laxis 160 mg TID>  Urine Out put 500 cc yesterday.  Plan to continue with current management.  Cath on 03-10-2019; PA 45/19.  ECHO Ef 20 % , Severe TR.  Underwent HD 1-15 and again 1-16. On Midodrine.  Weight; 200---189.  2-Acute on chronic renal failure stage IV: Prior creatinine 2.9--3.5 last year. Considering cardiorenal syndrome. Nephrology following for need of CRRT.  Underwent HD 1-15 and 1-6 for large volume removal.  Plan to monitor urine out put and needs for HD>   3-Anemia; MGUS with Thrombocytopenia;  Follow up with Dr Delton Coombes.  M spike 22/28. Kappa free light chain 1392. B 2 microglobulin  9.6.  electrophoresis protein and kappa/lamda ratio : 43, M spike 1.2.  Appreciate Oncology assistance.  Underwent BM Biopsy 1-15. Results pending.   4-Chronic A fib; Coumadin on hold, in case require procedure.  Plan to start heparin when INR less than 2 On heparin gtt.   5-Diabetes; SSI for now due to hypoglycemia.   6-Hyponatremia; related to hypervolemia.  Continue with diuresis.   7-Abdominal Firmness, CT negative for Hematoma.  Postraumatic seroma/mild Sherry Ruffing lesion, patient denies any recent trauma. Probably old injury.   8-Secondary Hyperparathyroidism; Hyperkalemia; mild on lasix. Resolved.  Hyponatremia; related to renal failure, heart failure.   Right LE pain, edema; doppler negative Cirrhosis.     Estimated body mass index is 30.57 kg/m as calculated from the following:   Height as of this encounter: 5\' 6"  (1.676 m).   Weight as of this encounter: 85.9 kg.   DVT prophylaxis: On Coumadin Code Status: Full code Family Communication: Care discussed with patient Disposition Plan:  Remain in the hospital for management of HF, renal failure.  Consultants:   Cardiology  Nephrology   Procedures:   ECHO 20 %    Antimicrobials:  None  Subjective: She is falling sleep, received pain medication for leg pain. Breathing ok.  Still with Significant LE edema Objective: Vitals:   03/14/19 0400 03/14/19 0450 03/14/19 0807 03/14/19 0808  BP:  (!) 93/57  (!) 93/53  Pulse: (!) 59 (!) 59 (!) 58 (!) 58  Resp: (!) 21 19  18   Temp:  98.1 F (36.7 C) 97.8 F (36.6 C)  TempSrc:  Oral Oral   SpO2:    95%  Weight:  85.9 kg    Height:        Intake/Output Summary (Last 24 hours) at 03/14/2019 1107 Last data filed at 03/14/2019 0946 Gross per 24 hour  Intake 1757.52 ml  Output 2000 ml  Net -242.48 ml   Filed Weights   03/13/19 1000 03/13/19 1231 03/14/19 0450  Weight: 86.2 kg 84.8 kg 85.9 kg    Examination:  General exam: NAD,  Sleepy Respiratory system: CTA Cardiovascular system: S 1, S 2 RRR Gastrointestinal system: BS present, soft, nt Central nervous system: sleepy Extremities: plus 2 edema Skin; no rashes    Data Reviewed: I have personally reviewed following labs and imaging studies  CBC: Recent Labs  Lab 03/09/19 0228 03/09/19 1558 03/10/19 0806 03/11/19 0628 03/12/19 0208 03/13/19 0213 03/14/19 0231  WBC 7.5  --  6.3 6.8 7.2 8.8 9.2  NEUTROABS 6.2  --  5.0 5.5 5.7 7.5  --   HGB 10.3*   < > 9.8* 10.1* 9.5* 9.6* 9.6*  HCT 30.1*   < > 29.1* 29.6* 27.6* 27.8* 28.0*  MCV 80.9  --  80.8 80.7 80.0 79.7* 80.7  PLT 133*  --  127* 139* 141* 144* 128*   < > = values in this interval not displayed.   Basic Metabolic Panel: Recent Labs  Lab 03/10/19 0806 03/11/19 0628 03/12/19 0208 03/13/19 0213 03/14/19 0231  NA 127* 128* 128* 130* 131*  K 4.6 4.3 4.0 3.8 3.9  CL 95* 93* 94* 93* 95*  CO2 17* 16* 19* 21* 24  GLUCOSE 169* 159* 150* 209* 173*  BUN 142* 150* 153* 106* 65*  CREATININE 5.11* 5.07* 5.06* 3.95* 3.18*  CALCIUM 6.9* 6.9* 7.0* 7.0* 7.4*  PHOS 7.4* 7.2* 7.3* 6.3* 4.8*   GFR: Estimated Creatinine Clearance: 21 mL/min (A) (by C-G formula based on SCr of 3.18 mg/dL (H)). Liver Function Tests: Recent Labs  Lab 03/07/19 2101 03/07/19 2101 03/08/19 0927 03/08/19 0927 03/10/19 0806 03/11/19 0628 03/12/19 0208 03/13/19 0213 03/14/19 0231  AST 23  --  24  --   --   --   --   --   --   ALT 17  --  16  --   --   --   --   --   --   ALKPHOS 324*  --  303*  --   --   --   --   --   --   BILITOT 3.0*  --  3.3*  --   --   --   --   --   --   PROT 7.7  --  7.3  --   --   --   --   --   --   ALBUMIN 2.9*   < > 2.7*   < > 2.2* 2.3* 2.1* 2.3* 2.4*   < > = values in this interval not displayed.   No results for input(s): LIPASE, AMYLASE in the last 168 hours. No results for input(s): AMMONIA in the last 168 hours. Coagulation Profile: Recent Labs  Lab 03/10/19 0806 03/11/19 0628  03/12/19 0208 03/13/19 0213 03/14/19 0231  INR 2.8* 2.4* 2.5* 2.1* 2.0*   Cardiac Enzymes: No results for input(s): CKTOTAL, CKMB, CKMBINDEX, TROPONINI in the last 168 hours. BNP (last 3 results) No results for input(s): PROBNP in the last 8760 hours. HbA1C: No results for input(s): HGBA1C in the last 72 hours. CBG: Recent Labs  Lab 03/13/19 0901 03/13/19 1308 03/13/19 1551 03/13/19 2057 03/14/19 0649  GLUCAP 211* 143* 210* 202* 149*   Lipid Profile: No results for input(s): CHOL, HDL, LDLCALC, TRIG, CHOLHDL, LDLDIRECT in the last 72 hours. Thyroid Function Tests: No results for input(s): TSH, T4TOTAL, FREET4, T3FREE, THYROIDAB in the last 72 hours. Anemia Panel: No results for input(s): VITAMINB12, FOLATE, FERRITIN, TIBC, IRON, RETICCTPCT in the last 72 hours. Sepsis Labs: No results for input(s): PROCALCITON, LATICACIDVEN in the last 168 hours.  Recent Results (from the past 240 hour(s))  Respiratory Panel by RT PCR (Flu A&B, Covid) - Nasopharyngeal Swab     Status: None   Collection Time: 03/07/19  9:21 PM   Specimen: Nasopharyngeal Swab  Result Value Ref Range Status   SARS Coronavirus 2 by RT PCR NEGATIVE NEGATIVE Final    Comment: (NOTE) SARS-CoV-2 target nucleic acids are NOT DETECTED. The SARS-CoV-2 RNA is generally detectable in upper respiratoy specimens during the acute phase of infection. The lowest concentration of SARS-CoV-2 viral copies this assay can detect is 131 copies/mL. A negative result does not preclude SARS-Cov-2 infection and should not be used as the sole basis for treatment or other patient management decisions. A negative result may occur with  improper specimen collection/handling, submission of specimen other than nasopharyngeal swab, presence of viral mutation(s) within the areas targeted by this assay, and inadequate number of viral copies (<131 copies/mL). A negative result must be combined with clinical observations, patient history,  and epidemiological information. The expected result is Negative. Fact Sheet for Patients:  PinkCheek.be Fact Sheet for Healthcare Providers:  GravelBags.it This test is not yet ap proved or cleared by the [place] FDA and  has been authorized for detection and/or diagnosis of SARS-CoV-2 by FDA under an Emergency Use Authorization (EUA). This EUA will remain  in effect (meaning this test can be used) for the duration of the COVID-19 declaration under Section 564(b)(1) of the Act, 21 U.S.C. section 360bbb-3(b)(1), unless the authorization is terminated or revoked sooner.    Influenza A by PCR NEGATIVE NEGATIVE Final   Influenza B by PCR NEGATIVE NEGATIVE Final    Comment: (NOTE) The Xpert Xpress SARS-CoV-2/FLU/RSV assay is intended as an aid in  the diagnosis of influenza from Nasopharyngeal swab specimens and  should not be used as a sole basis for treatment. Nasal washings and  aspirates are unacceptable for Xpert Xpress SARS-CoV-2/FLU/RSV  testing. Fact Sheet for Patients: PinkCheek.be Fact Sheet for Healthcare Providers: GravelBags.it This test is not yet approved or cleared by the [place] FDA and  has been authorized for detection and/or diagnosis of SARS-CoV-2 by  FDA under an Emergency Use Authorization (EUA). This EUA will remain  in effect (meaning this test can be used) for the duration of the  Covid-19 declaration under Section 564(b)(1) of the Act, 21  U.S.C. section 360bbb-3(b)(1), unless the authorization is  terminated or revoked. Performed at [facility], [address] [place], [place] [address]   MRSA PCR Screening     Status: None   Collection Time: 03/08/19 10:26 PM   Specimen: Nasopharyngeal  Result Value Ref Range Status   MRSA by PCR NEGATIVE NEGATIVE Final    Comment:        The GeneXpert MRSA Assay (FDA approved for NASAL  specimens only), is one component of a comprehensive MRSA colonization surveillance program. It is not intended to diagnose MRSA infection nor to guide or monitor treatment for MRSA infections. Performed at [facility], [address], [place], [place] [address]          Radiology Studies: IR Fluoro Guide CV Line Right  Result Date: 03/12/2019 INDICATION: 59 year old female with acute kidney injury in need of hemodialysis. She presents for placement of a temporary hemodialysis catheter. EXAM: IR ULTRASOUND GUIDANCE VASC ACCESS RIGHT; IR RIGHT FLUORO GUIDE CV LINE MEDICATIONS: None. ANESTHESIA/SEDATION: None. FLUOROSCOPY TIME:  Fluoroscopy Time: 0 minutes 30 seconds (2.5 mGy). COMPLICATIONS: None immediate. PROCEDURE: Informed written consent was obtained from the patient after a thorough discussion of the procedural risks, benefits and alternatives. All questions were addressed. Maximal Sterile Barrier Technique was utilized including caps, mask, sterile gowns, sterile gloves, sterile drape, hand hygiene and skin antiseptic. A timeout was performed prior to the initiation of the procedure. The right internal jugular vein was interrogated with ultrasound and found to be widely patent. An image was obtained and stored for the medical record. Local anesthesia was attained by infiltration with 1% lidocaine. A small dermatotomy was made. Under real-time sonographic guidance, the vessel was punctured with a 21 gauge micropuncture needle. Using standard technique, the initial micro needle was exchanged over a 0.018 micro wire for a transitional 4 [place] micro sheath. The micro sheath was then exchanged over a 0.035 wire for a fascial dilator. The soft tissue tract was then dilated. A 16 cm triple-lumen temporary hemodialysis catheter was then advanced over the wire and positioned with the catheter tip in the upper right atrium. Catheter flushes and aspirates easily. The catheter was flushed with  heparinized saline, capped and secured to the skin with 0 Prolene suture. A sterile bandage was applied. IMPRESSION: Successful placement of a right IJ approach 16 cm non tunneled triple-lumen hemodialysis catheter. The catheter tips are in the upper right atrium and the catheter is ready for immediate use. Signed, Criselda Peaches, MD, [place] [facility] Radiology Electronically Signed   By: Jacqulynn Cadet M.D.   On: 03/12/2019 16:20   IR US Guide Vasc Access Right  Result Date: 03/12/2019 INDICATION: 59 year old female with acute kidney injury in need of hemodialysis. She presents for placement of a temporary hemodialysis catheter. EXAM: IR ULTRASOUND GUIDANCE VASC ACCESS RIGHT; IR RIGHT FLUORO GUIDE CV LINE MEDICATIONS: None. ANESTHESIA/SEDATION: None. FLUOROSCOPY TIME:  Fluoroscopy Time: 0 minutes 30 seconds (2.5 mGy). COMPLICATIONS: None immediate. PROCEDURE: Informed written consent was obtained from the patient after a thorough discussion of the procedural risks, benefits and alternatives. All questions were addressed. Maximal Sterile Barrier Technique was utilized including caps, mask, sterile gowns, sterile gloves, sterile drape, hand hygiene and skin antiseptic. A timeout was performed prior to the initiation of the procedure. The right internal jugular vein was interrogated with ultrasound and found to be widely patent. An image was obtained and stored for the medical record. Local anesthesia was attained by infiltration with 1% lidocaine. A small dermatotomy was made. Under real-time sonographic guidance, the vessel was punctured with a 21 gauge micropuncture needle. Using standard technique, the initial micro needle was exchanged over a 0.018 micro wire for a transitional 4 [place] micro sheath. The micro sheath was then exchanged over a 0.035 wire for a fascial dilator. The soft tissue tract was then dilated. A 16 cm triple-lumen temporary  hemodialysis catheter was then advanced over the wire and positioned with the catheter tip in the upper right atrium. Catheter flushes and aspirates easily. The catheter was flushed with heparinized saline, capped and secured to the skin with 0 Prolene  suture. A sterile bandage was applied. IMPRESSION: Successful placement of a right IJ approach 16 cm non tunneled triple-lumen hemodialysis catheter. The catheter tips are in the upper right atrium and the catheter is ready for immediate use. Signed, Criselda Peaches, MD, [place] [facility] Radiology Electronically Signed   By: Jacqulynn Cadet M.D.   On: 03/12/2019 16:20   VAS [place] LOWER EXTREMITY VENOUS (DVT)  Result Date: 03/13/2019  Lower Venous Study Indications: Pain.  Limitations: Pain tolerance. Comparison Study: no prior Performing Technologist: Abram Sander RVS  Examination Guidelines: A complete evaluation includes B-mode imaging, spectral Doppler, color Doppler, and power Doppler as needed of all accessible portions of each vessel. Bilateral testing is considered an integral part of a complete examination. Limited examinations for reoccurring indications may be performed as noted.  +---------+---------------+---------+-----------+----------+-------------------+  RIGHT     Compressibility Phasicity Spontaneity Properties Thrombus Aging       +---------+---------------+---------+-----------+----------+-------------------+  CFV       Full            Yes       Yes                                         +---------+---------------+---------+-----------+----------+-------------------+  SFJ       Full                                                                  +---------+---------------+---------+-----------+----------+-------------------+  FV Prox   Full                                                                  +---------+---------------+---------+-----------+----------+-------------------+  FV Mid                     Yes       Yes                    unable to tolerate                                                               compression          +---------+---------------+---------+-----------+----------+-------------------+  FV Distal                 Yes       Yes                    unable to tolerate  compression          +---------+---------------+---------+-----------+----------+-------------------+  PFV       Full                                                                  +---------+---------------+---------+-----------+----------+-------------------+  POP       Full            Yes       Yes                                         +---------+---------------+---------+-----------+----------+-------------------+  PTV       Full                                                                  +---------+---------------+---------+-----------+----------+-------------------+  PERO      Full                                                                  +---------+---------------+---------+-----------+----------+-------------------+   +----+---------------+---------+-----------+----------+--------------+  LEFT Compressibility Phasicity Spontaneity Properties Thrombus Aging  +----+---------------+---------+-----------+----------+--------------+  CFV  Full            Yes                                              +----+---------------+---------+-----------+----------+--------------+     Summary: Right: There is no evidence of deep vein thrombosis in the lower extremity. However, portions of this examination were limited- see technologist comments above. No cystic structure found in the popliteal fossa. Left: No evidence of common femoral vein obstruction.  *See table(s) above for measurements and observations. Electronically signed by Monica Martinez MD on 03/13/2019 at 1:49:39 PM.    Final         Scheduled Meds:  calcitRIOL  0.5  mcg Oral Daily   Chlorhexidine Gluconate Cloth  6 each Topical Q0600   Chlorhexidine Gluconate Cloth  6 each Topical Q0600   cholecalciferol  1,000 Units Oral Daily   insulin aspart  0-9 Units Subcutaneous TID WC   lidocaine  10 mL Intradermal Once   metolazone  2.5 mg Oral BID   midodrine  10 mg Oral TID WC   sodium chloride flush  3 mL Intravenous Q12H   sodium chloride flush  3 mL Intravenous Q12H   vitamin B-12  100 mcg Oral Daily   Continuous Infusions:  sodium chloride 10 mL/hr at 03/14/19 0727   DOBUTamine 2.5 mcg/kg/min (03/14/19 0727)   ferric gluconate (FERRLECIT/NULECIT) IV 125 mg (03/14/19 0943)   furosemide 160 mg (03/14/19 0945)   heparin 1,350 Units/hr (03/14/19 0727)  LOS: 6 days    Time spent: 35 minutes.     Elmarie Shiley, MD [facility]   If 7PM-7AM, please contact night-coverage [email or web address] Password [facility] 03/14/2019, 11:07 AM

## 2019-03-14 NOTE — Plan of Care (Signed)
  Problem: Education: Goal: Ability to demonstrate management of disease process will improve Outcome: Progressing Goal: Ability to verbalize understanding of medication therapies will improve Outcome: Progressing Goal: Individualized Educational Video(s) Outcome: Progressing   Problem: Activity: Goal: Capacity to carry out activities will improve Outcome: Progressing   Problem: Cardiac: Goal: Ability to achieve and maintain adequate cardiopulmonary perfusion will improve Outcome: Progressing   Problem: Education: Goal: Knowledge of General Education information will improve Description: Including pain rating scale, medication(s)/side effects and non-pharmacologic comfort measures Outcome: Progressing   Problem: Health Behavior/Discharge Planning: Goal: Ability to manage health-related needs will improve Outcome: Progressing   Problem: Clinical Measurements: Goal: Ability to maintain clinical measurements within normal limits will improve Outcome: Progressing Goal: Will remain free from infection Outcome: Progressing Goal: Diagnostic test results will improve Outcome: Progressing Goal: Respiratory complications will improve Outcome: Progressing Goal: Cardiovascular complication will be avoided Outcome: Progressing   Problem: Activity: Goal: Risk for activity intolerance will decrease Outcome: Progressing   Problem: Nutrition: Goal: Adequate nutrition will be maintained Outcome: Progressing   Problem: Coping: Goal: Level of anxiety will decrease Outcome: Progressing   Problem: Elimination: Goal: Will not experience complications related to bowel motility Outcome: Progressing Goal: Will not experience complications related to urinary retention Outcome: Progressing   Problem: Pain Managment: Goal: General experience of comfort will improve Outcome: Progressing   Problem: Safety: Goal: Ability to remain free from injury will improve Outcome: Progressing    Problem: Skin Integrity: Goal: Risk for impaired skin integrity will decrease Outcome: Progressing   Problem: Education: Goal: Understanding of CV disease, CV risk reduction, and recovery process will improve Outcome: Progressing Goal: Individualized Educational Video(s) Outcome: Progressing   Problem: Activity: Goal: Ability to return to baseline activity level will improve Outcome: Progressing   Problem: Cardiovascular: Goal: Ability to achieve and maintain adequate cardiovascular perfusion will improve Outcome: Progressing Goal: Vascular access site(s) Level 0-1 will be maintained Outcome: Progressing   Problem: Health Behavior/Discharge Planning: Goal: Ability to safely manage health-related needs after discharge will improve Outcome: Progressing   

## 2019-03-15 ENCOUNTER — Other Ambulatory Visit: Payer: Self-pay

## 2019-03-15 DIAGNOSIS — N170 Acute kidney failure with tubular necrosis: Secondary | ICD-10-CM

## 2019-03-15 DIAGNOSIS — N1832 Chronic kidney disease, stage 3b: Secondary | ICD-10-CM

## 2019-03-15 LAB — RENAL FUNCTION PANEL
Albumin: 2.3 g/dL — ABNORMAL LOW (ref 3.5–5.0)
Anion gap: 14 (ref 5–15)
BUN: 74 mg/dL — ABNORMAL HIGH (ref 6–20)
CO2: 22 mmol/L (ref 22–32)
Calcium: 7.5 mg/dL — ABNORMAL LOW (ref 8.9–10.3)
Chloride: 94 mmol/L — ABNORMAL LOW (ref 98–111)
Creatinine, Ser: 3.39 mg/dL — ABNORMAL HIGH (ref 0.44–1.00)
GFR calc Af Amer: 16 mL/min — ABNORMAL LOW (ref >60)
GFR calc non Af Amer: 14 mL/min — ABNORMAL LOW (ref >60)
Glucose, Bld: 127 mg/dL — ABNORMAL HIGH (ref 70–99)
Phosphorus: 5.9 mg/dL — ABNORMAL HIGH (ref 2.5–4.6)
Potassium: 4.3 mmol/L (ref 3.5–5.1)
Sodium: 130 mmol/L — ABNORMAL LOW (ref 135–145)

## 2019-03-15 LAB — GLUCOSE, CAPILLARY
Glucose-Capillary: 115 mg/dL — ABNORMAL HIGH (ref 70–99)
Glucose-Capillary: 182 mg/dL — ABNORMAL HIGH (ref 70–99)
Glucose-Capillary: 126 mg/dL — ABNORMAL HIGH (ref 70–99)
Glucose-Capillary: 144 mg/dL — ABNORMAL HIGH (ref 70–99)

## 2019-03-15 LAB — CBC
HCT: 28.5 % — ABNORMAL LOW (ref 36.0–46.0)
Hemoglobin: 9.6 g/dL — ABNORMAL LOW (ref 12.0–15.0)
MCH: 27.9 pg (ref 26.0–34.0)
MCHC: 33.7 g/dL (ref 30.0–36.0)
MCV: 82.8 fL (ref 80.0–100.0)
Platelets: 150 10*3/uL (ref 150–400)
RBC: 3.44 MIL/uL — ABNORMAL LOW (ref 3.87–5.11)
RDW: 16.5 % — ABNORMAL HIGH (ref 11.5–15.5)
WBC: 9.8 10*3/uL (ref 4.0–10.5)
nRBC: 0.5 % — ABNORMAL HIGH (ref 0.0–0.2)

## 2019-03-15 LAB — HEPARIN LEVEL (UNFRACTIONATED)
Heparin Unfractionated: 0.31 IU/mL (ref 0.30–0.70)
Heparin Unfractionated: 0.35 IU/mL (ref 0.30–0.70)

## 2019-03-15 LAB — PROTIME-INR
INR: 1.7 — ABNORMAL HIGH (ref 0.8–1.2)
Prothrombin Time: 19.4 seconds — ABNORMAL HIGH (ref 11.4–15.2)

## 2019-03-15 MED ORDER — HEPARIN SODIUM (PORCINE) 1000 UNIT/ML IJ SOLN
INTRAMUSCULAR | Status: AC
  Administered 2019-03-15: 2600 [IU] via INTRAVENOUS
  Filled 2019-03-15: qty 3

## 2019-03-15 MED ORDER — TRAMADOL HCL 50 MG PO TABS
ORAL_TABLET | ORAL | Status: AC
  Administered 2019-03-15: 25 mg via ORAL
  Filled 2019-03-15: qty 1

## 2019-03-15 NOTE — Progress Notes (Signed)
PROGRESS NOTE    [name]  KZS:[id number] DOB: 09-Apr-1960 DOA: 03/07/2019 PCP: [name], MD   Brief Narrative: 59 year old with past medical history significant for MGUS, thrombocytopenia, A. fib on Coumadin, CHB with pacemaker, nonischemic cardiomyopathy with ejection fraction 15 to 20% with ICD, CKD stage IV baseline creatinine 3.0, cirrhosis who presented with several weeks of progressive swelling about 30 pound weight gain.  Evaluation in the ED patient was found to have a creatinine more than 5, anasarca, hemoglobin of 11.  Chest x-ray was clear but she required 2 L of supplemental oxygen.  Patient received 40 mg of IV Lasix without significant urine output.  Patient was transferred to [facility] for heart failure team evaluation, patient was a started on inotrope.    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 Started on dobutamine drip. Continue with IV Laxis 160 mg TID>  Plan to continue with current management.  Cath on 03-10-2019; PA 45/19.  ECHO Ef 20 % , Severe TR.  Underwent HD 1-15 and again 1-16. On Midodrine.  Weight; 200---189.--192. No significant urine out put,, weight up. Plan to proceed with HD today.   2-Acute on chronic renal failure stage IV: Prior creatinine 2.9--3.5 last year. Considering cardiorenal syndrome. Nephrology following for need of CRRT.  Underwent HD 1-15 and 1-6 for large volume removal.  Plan to monitor urine out put and needs for HD>  No significant urine out put, weight up. plna for HD today.   3-Anemia; MGUS with  Thrombocytopenia;  Follow up with Dr Delton Coombes.  M spike 22/28. Kappa free light chain 1392. B 2 microglobulin 9.6.  electrophoresis protein and kappa/lamda ratio : 43, M spike 1.2.  Appreciate Oncology assistance.  Underwent BM Biopsy 1-15. Preliminary report per Dr. Lindi Adie note BM preliminary report with 14 % plasma cell  which define MM. He will ask Dr Lorenso Courier to see patient in consultation for treatment options.   4-Chronic A fib; Coumadin on hold, in case require procedure.  Plan to start heparin when INR less than 2 On heparin gtt.   5-Diabetes; SSI for now due to hypoglycemia.   6-Hyponatremia; related to hypervolemia.  Continue with diuresis.   7-Abdominal Firmness, CT negative for Hematoma.  Postraumatic seroma/mild Sherry Ruffing lesion, patient denies any recent trauma. Probably old injury.   8-Secondary Hyperparathyroidism; Hyperkalemia; mild on lasix. Resolved.  Hyponatremia; related to renal failure, heart failure.   Right LE pain, edema; doppler negative Cirrhosis.     Estimated body mass index is 31.06 kg/m as calculated from the following:   Height as of this encounter: 5\' 6"  (1.676 m).   Weight as of this encounter: 87.3 kg.   DVT prophylaxis: On Coumadin Code Status: Full code Family Communication: Care discussed with patient Disposition Plan:  Remain in the hospital for management of HF, renal failure. Requiring HD currently, and evaluation for MM.  Consultants:   Cardiology  Nephrology   Procedures:   ECHO 20 %    Antimicrobials:  None  Subjective: She is alert, still complaining of LE pain and edema Denies worsening  dyspnea.   Objective: Vitals:   03/14/19 2344 03/15/19 0324 03/15/19 0330 03/15/19 0747  BP: (!) 96/55 (!) 101/58  99/62  Pulse: 60 (!) 59  60  Resp: 16 18  14   Temp: 97.8 F (36.6 C) 97.7 F (36.5 C)  (!) 97.5 F (36.4 C)  TempSrc: Oral Oral  Oral  SpO2: 99% 100%  90%  Weight:   87.3 kg   Height:         Intake/Output Summary (Last 24 hours) at 03/15/2019 0818 Last data filed at 03/14/2019 2219 Gross per 24 hour  Intake 647.21 ml  Output 525 ml  Net 122.21 ml   Filed Weights   03/13/19 1231 03/14/19 0450 03/15/19 0330  Weight: 84.8 kg 85.9 kg 87.3 kg    Examination:  General exam: NAD Respiratory system: Crackles bases.  Cardiovascular system: S 1, S 2 RRR Gastrointestinal system: BS present, soft, nt Central nervous system: Alert, following command Extremities: Plus 2 edema Skin; No rashes    Data Reviewed: I have personally reviewed following labs and imaging studies  CBC: Recent Labs  Lab 03/09/19 0228 03/09/19 1558 03/10/19 0806 03/10/19 0806 03/11/19 0628 03/12/19 0208 03/13/19 0213 03/14/19 0231 03/15/19 0644  WBC 7.5  --  6.3   < > 6.8 7.2 8.8 9.2 9.8  NEUTROABS 6.2  --  5.0  --  5.5 5.7 7.5  --   --   HGB 10.3*   < > 9.8*   < > 10.1* 9.5* 9.6* 9.6* 9.6*  HCT 30.1*   < > 29.1*   < > 29.6* 27.6* 27.8* 28.0* 28.5*  MCV 80.9  --  80.8   < > 80.7 80.0 79.7* 80.7 82.8  PLT 133*  --  127*   < > 139* 141* 144* 128* 150   < > = values in this interval not displayed.   Basic Metabolic Panel: Recent Labs  Lab 03/11/19 0628 03/12/19 0208 03/13/19 0213 03/14/19 0231 03/15/19 0644  NA 128* 128* 130* 131* 130*  K 4.3 4.0 3.8 3.9 4.3  CL 93* 94* 93* 95* 94*  CO2 16* 19* 21* 24 22  GLUCOSE 159* 150* 209* 173* 127*  BUN 150* 153* 106* 65* 74*  CREATININE 5.07* 5.06* 3.95* 3.18* 3.39*  CALCIUM 6.9* 7.0* 7.0* 7.4* 7.5*  PHOS 7.2* 7.3* 6.3* 4.8* 5.9*   GFR: Estimated Creatinine Clearance: 19.9 mL/min (A) (by C-G formula based on SCr of 3.39 mg/dL (H)). Liver Function Tests: Recent Labs  Lab 03/08/19 0927 03/10/19 0806 03/11/19 0628 03/12/19 0208 03/13/19 0213 03/14/19 0231 03/15/19 0644  AST 24  --   --   --   --   --   --   ALT 16  --   --   --   --   --   --   ALKPHOS 303*  --   --   --   --   --   --   BILITOT 3.3*  --   --   --   --   --   --    PROT 7.3  --   --   --   --   --   --   ALBUMIN 2.7*   < > 2.3* 2.1* 2.3* 2.4* 2.3*   < > = values in this interval not displayed.   No results for input(s): LIPASE, AMYLASE in the last 168 hours. No results for input(s): AMMONIA in the last 168 hours. Coagulation Profile: Recent Labs  Lab  03/11/19 3151 03/12/19 0208 03/13/19 0213 03/14/19 0231 03/15/19 0644  INR 2.4* 2.5* 2.1* 2.0* 1.7*   Cardiac Enzymes: No results for input(s): CKTOTAL, CKMB, CKMBINDEX, TROPONINI in the last 168 hours. BNP (last 3 results) No results for input(s): PROBNP in the last 8760 hours. HbA1C: No results for input(s): HGBA1C in the last 72 hours. CBG: Recent Labs  Lab 03/14/19 0649 03/14/19 1122 03/14/19 1623 03/14/19 2129 03/15/19 0623  GLUCAP 149* 230* 183* 168* 115*   Lipid Profile: No results for input(s): CHOL, HDL, LDLCALC, TRIG, CHOLHDL, LDLDIRECT in the last 72 hours. Thyroid Function Tests: No results for input(s): TSH, T4TOTAL, FREET4, T3FREE, THYROIDAB in the last 72 hours. Anemia Panel: No results for input(s): VITAMINB12, FOLATE, FERRITIN, TIBC, IRON, RETICCTPCT in the last 72 hours. Sepsis Labs: No results for input(s): PROCALCITON, LATICACIDVEN in the last 168 hours.  Recent Results (from the past 240 hour(s))  Respiratory Panel by RT PCR (Flu A&B, Covid) - Nasopharyngeal Swab     Status: None   Collection Time: 03/07/19  9:21 PM   Specimen: Nasopharyngeal Swab  Result Value Ref Range Status   SARS Coronavirus 2 by RT PCR NEGATIVE NEGATIVE Final    Comment: (NOTE) SARS-CoV-2 target nucleic acids are NOT DETECTED. The SARS-CoV-2 RNA is generally detectable in upper respiratoy specimens during the acute phase of infection. The lowest concentration of SARS-CoV-2 viral copies this assay can detect is 131 copies/mL. A negative result does not preclude SARS-Cov-2 infection and should not be used as the sole basis for treatment or other patient management decisions. A  negative result may occur with  improper specimen collection/handling, submission of specimen other than nasopharyngeal swab, presence of viral mutation(s) within the areas targeted by this assay, and inadequate number of viral copies (<131 copies/mL). A negative result must be combined with clinical observations, patient history, and epidemiological information. The expected result is Negative. Fact Sheet for Patients:  PinkCheek.be Fact Sheet for Healthcare Providers:  GravelBags.it This test is not yet ap proved or cleared by the [place] FDA and  has been authorized for detection and/or diagnosis of SARS-CoV-2 by FDA under an Emergency Use Authorization (EUA). This EUA will remain  in effect (meaning this test can be used) for the duration of the COVID-19 declaration under Section 564(b)(1) of the Act, 21 U.S.C. section 360bbb-3(b)(1), unless the authorization is terminated or revoked sooner.    Influenza A by PCR NEGATIVE NEGATIVE Final   Influenza B by PCR NEGATIVE NEGATIVE Final    Comment: (NOTE) The Xpert Xpress SARS-CoV-2/FLU/RSV assay is intended as an aid in  the diagnosis of influenza from Nasopharyngeal swab specimens and  should not be used as a sole basis for treatment. Nasal washings and  aspirates are unacceptable for Xpert Xpress SARS-CoV-2/FLU/RSV  testing. Fact Sheet for Patients: PinkCheek.be Fact Sheet for Healthcare Providers: GravelBags.it This test is not yet approved or cleared by the [place] FDA and  has been authorized for detection and/or diagnosis of SARS-CoV-2 by  FDA under an Emergency Use Authorization (EUA). This EUA will remain  in effect (meaning this test can be used) for the duration of the  Covid-19 declaration under Section 564(b)(1) of the Act, 21  U.S.C. section 360bbb-3(b)(1), unless the authorization is   terminated or revoked. Performed at [facility], [address], [place], [place] [address]   MRSA PCR Screening     Status: None   Collection Time: 03/08/19 10:26 PM   Specimen: Nasopharyngeal  Result Value Ref  Range Status   MRSA by PCR NEGATIVE NEGATIVE Final    Comment:        The GeneXpert MRSA Assay (FDA approved for NASAL specimens only), is one component of a comprehensive MRSA colonization surveillance program. It is not intended to diagnose MRSA infection nor to guide or monitor treatment for MRSA infections. Performed at [facility], [place] [address] [place], [place] [address]          Radiology Studies: VAS [place] LOWER EXTREMITY VENOUS (DVT)  Result Date: 03/13/2019  Lower Venous Study Indications: Pain.  Limitations: Pain tolerance. Comparison Study: no prior Performing Technologist: Abram Sander RVS  Examination Guidelines: A complete evaluation includes B-mode imaging, spectral Doppler, color Doppler, and power Doppler as needed of all accessible portions of each vessel. Bilateral testing is considered an integral part of a complete examination. Limited examinations for reoccurring indications may be performed as noted.  +---------+---------------+---------+-----------+----------+-------------------+ RIGHT    CompressibilityPhasicitySpontaneityPropertiesThrombus Aging      +---------+---------------+---------+-----------+----------+-------------------+ CFV      Full           Yes      Yes                                      +---------+---------------+---------+-----------+----------+-------------------+ SFJ      Full                                                             +---------+---------------+---------+-----------+----------+-------------------+ FV Prox  Full                                                             +---------+---------------+---------+-----------+----------+-------------------+ FV Mid                  Yes       Yes                  unable to tolerate                                                        compression         +---------+---------------+---------+-----------+----------+-------------------+ FV Distal               Yes      Yes                  unable to tolerate                                                        compression         +---------+---------------+---------+-----------+----------+-------------------+ PFV      Full                                                             +---------+---------------+---------+-----------+----------+-------------------+  POP      Full           Yes      Yes                                      +---------+---------------+---------+-----------+----------+-------------------+ PTV      Full                                                             +---------+---------------+---------+-----------+----------+-------------------+ PERO     Full                                                             +---------+---------------+---------+-----------+----------+-------------------+   +----+---------------+---------+-----------+----------+--------------+ LEFTCompressibilityPhasicitySpontaneityPropertiesThrombus Aging +----+---------------+---------+-----------+----------+--------------+ CFV Full           Yes                                          +----+---------------+---------+-----------+----------+--------------+     Summary: Right: There is no evidence of deep vein thrombosis in the lower extremity. However, portions of this examination were limited- see technologist comments above. No cystic structure found in the popliteal fossa. Left: No evidence of common femoral vein obstruction.  *See table(s) above for measurements and observations. Electronically signed by Monica Martinez MD on 03/13/2019 at 1:49:39 PM.    Final         Scheduled Meds: . calcitRIOL  0.5 mcg Oral Daily  .  Chlorhexidine Gluconate Cloth  6 each Topical Q0600  . Chlorhexidine Gluconate Cloth  6 each Topical Q0600  . cholecalciferol  1,000 Units Oral Daily  . insulin aspart  0-9 Units Subcutaneous TID WC  . lidocaine  10 mL Intradermal Once  . metolazone  2.5 mg Oral BID  . midodrine  10 mg Oral TID WC  . sodium chloride flush  3 mL Intravenous Q12H  . sodium chloride flush  3 mL Intravenous Q12H  . vitamin B-12  100 mcg Oral Daily   Continuous Infusions: . sodium chloride 10 mL/hr at 03/14/19 1615  . DOBUTamine 2.5 mcg/kg/min (03/15/19 4967)  . ferric gluconate (FERRLECIT/NULECIT) IV 110 mL/hr at 03/14/19 1615  . furosemide Stopped (03/14/19 2215)  . heparin 1,500 Units/hr (03/14/19 1615)     LOS: 7 days    Time spent: 35 minutes.     Elmarie Shiley, MD [facility]   If 7PM-7AM, please contact night-coverage [email or web address] Password [facility] 03/15/2019, 8:18 AM

## 2019-03-15 NOTE — Progress Notes (Signed)
ANTICOAGULATION CONSULT NOTE - [place] for heparin Indication: atrial fibrillation  Allergies  Allergen Reactions  . Wheat Swelling  . Latex Rash  . Penicillins Rash  . Sulfa Antibiotics Rash    Patient Measurements: Height: 5\' 6"  (167.6 cm) Weight: 192 lb 7.4 oz (87.3 kg) IBW/kg (Calculated) : 59.3  Vital Signs: Temp: 97.5 F (36.4 C) (01/18 1120) Temp Source: Oral (01/18 1120) BP: 98/59 (01/18 1120) Pulse Rate: 60 (01/18 1200)  Labs: Recent Labs    03/13/19 0213 03/13/19 0213 03/14/19 0231 03/14/19 1244 03/15/19 0644  HGB 9.6*   < > 9.6*  --  9.6*  HCT 27.8*  --  28.0*  --  28.5*  PLT 144*  --  128*  --  150  LABPROT 23.7*  --  22.9*  --  19.4*  INR 2.1*  --  2.0*  --  1.7*  HEPARINUNFRC  --   --  <0.10* 0.23* 0.31  CREATININE 3.95*  --  3.18*  --  3.39*   < > = values in this interval not displayed.    Estimated Creatinine Clearance: 19.9 mL/min (A) (by C-G formula based on SCr of 3.39 mg/dL (H)).   Assessment: 59 yo female with hx AFib, Coumadin on hold, continue heparin bridge.   INR down to 1.7 today. CBC stable. Heparin level within goal range, no overt bleeding or complications noted.   Goal of Therapy:  Heparin level goal 0.3-0.7 INR 2-3 Monitor platelets by anticoagulation protocol: Yes   Plan:  Continue IV heparin at current rate. Confirm heparin level at 2 pm. Daily heparin level and CBC. F/u plans to resume Coumadin once all procedures done.  Marguerite Olea, [facility] Clinical Pharmacist Phone [phone]  03/15/2019 12:31 PM

## 2019-03-15 NOTE — Progress Notes (Signed)
Subjective:    Not much UOP, 0.5L from dobut + lasix + metolazone  SCr up to 3.39,. K 4.3  Remains severely edematous into abdomen  Objective Vital signs in last 24 hours: Vitals:   03/14/19 2344 03/15/19 0324 03/15/19 0330 03/15/19 0747  BP: (!) 96/55 (!) 101/58  99/62  Pulse: 60 (!) 59  60  Resp: _0 Temp: 97.8 F (36.6 C) 97.7 F (36.5 C)  (!) 97.5 F (36.4 C)  TempSrc: Oral Oral  Oral  SpO2: 99% 100%  90%  Weight:   87.3 kg   Height:       Weight change: 1.1 kg  Intake/Output Summary (Last 24 hours) at 03/15/2019 1120 Last data filed at 03/15/2019 1000 Gross per 24 hour  Intake 699.41 ml  Output 925 ml  Net -225.59 ml    Assessment/ Plan: Pt is a 59 y.o. yo female with nonischemic cardiomyopathy/MGUS/DM and baseline CKD (crt 3's)  who was admitted on 03/07/2019 with massive volume overload and crt 5  Assessment/Plan: 1. Renal-  A on CRF.  Had advanced CKD at baseline. Suspect cardiorenal vs DM bu also has M spike.  Per cards-  Heart does not look like amyloid.  Onc has seen, s/p bone marrow biopsy- results pending.   Now with some acute change vs progression in the setting of massive volume overload.  Definitely has the pathology of cardiorenal process.  "Failed" a trial of dobutamine and high dose lasix.  S/p vascath and first HD 1/15-  Second 1/16.   Not responding to aggressvie inotrope and diuretic, HD today for UF, BP permitting, trial bolus albumin with HD 2. Vol/htn-  Very volume overloaded and relatively hypotensive-  As above. On midodrine 3. Anemia- not a major issue at this point which argues against myeloma as does low calcium-  S/p bone marrow biopsy- results pending.  Iron low-  Will give IV iron 4. Secondary hyperparathyroidism- is on calcitriol-  Will cont - phos 7.2-  Should come down some with HD- no binder yet 5. Hyperkalemia-  Resolved    6. Hyponatremia-  Likely hypervolemic hyponatremia-  Improving as volume does  Rexene Agent    Labs: Basic Metabolic Panel: Recent Labs  Lab 03/13/19 0213 03/14/19 0231 03/15/19 0644  NA 130* 131* 130*  K 3.8 3.9 4.3  CL 93* 95* 94*  CO2 21* 24 22  GLUCOSE 209* 173* 127*  BUN 106* 65* 74*  CREATININE 3.95* 3.18* 3.39*  CALCIUM 7.0* 7.4* 7.5*  PHOS 6.3* 4.8* 5.9*   Liver Function Tests: Recent Labs  Lab 03/13/19 0213 03/14/19 0231 03/15/19 0644  ALBUMIN 2.3* 2.4* 2.3*   No results for input(s): LIPASE, AMYLASE in the last 168 hours. No results for input(s): AMMONIA in the last 168 hours. CBC: Recent Labs  Lab 03/11/19 0628 03/11/19 0628 03/12/19 0208 03/12/19 0208 03/13/19 0213 03/14/19 0231 03/15/19 0644  WBC 6.8   < > 7.2   < > 8.8 9.2 9.8  NEUTROABS 5.5  --  5.7  --  7.5  --   --   HGB 10.1*   < > 9.5*   < > 9.6* 9.6* 9.6*  HCT 29.6*   < > 27.6*   < > 27.8* 28.0* 28.5*  MCV 80.7  --  80.0  --  79.7* 80.7 82.8  PLT 139*   < > 141*   < > 144* 128* 150   < > = values in this interval not  displayed.   Cardiac Enzymes: No results for input(s): CKTOTAL, CKMB, CKMBINDEX, TROPONINI in the last 168 hours. CBG: Recent Labs  Lab 03/14/19 0649 03/14/19 1122 03/14/19 1623 03/14/19 2129 03/15/19 0623  GLUCAP 149* 230* 183* 168* 115*    Iron Studies:  No results for input(s): IRON, TIBC, TRANSFERRIN, FERRITIN in the last 72 hours. Studies/Results: No results found. Medications: Infusions: . sodium chloride 10 mL/hr at 03/14/19 1615  . DOBUTamine 1 mcg/kg/min (03/15/19 1100)  . ferric gluconate (FERRLECIT/NULECIT) IV 110 mL/hr at 03/14/19 1615  . furosemide 160 mg (03/15/19 1019)  . heparin 1,500 Units/hr (03/15/19 1100)    Scheduled Medications: . calcitRIOL  0.5 mcg Oral Daily  . Chlorhexidine Gluconate Cloth  6 each Topical Q0600  . Chlorhexidine Gluconate Cloth  6 each Topical Q0600  . cholecalciferol  1,000 Units Oral Daily  . insulin aspart  0-9 Units Subcutaneous TID WC  . lidocaine  10 mL Intradermal Once  . metolazone  2.5  mg Oral BID  . midodrine  10 mg Oral TID WC  . sodium chloride flush  3 mL Intravenous Q12H  . sodium chloride flush  3 mL Intravenous Q12H  . vitamin B-12  100 mcg Oral Daily    have reviewed scheduled and prn medications.  Physical Exam: General: alert, pleasan Heart: RRR Lungs: dec BS at bases Abdomen: soft, non tender Extremities: pitting edema throughout    03/15/2019,11:20 AM  LOS: 7 days

## 2019-03-15 NOTE — Progress Notes (Signed)
Hematology Preliminary results of the bone marrow biopsy suggest 14% plasma cells by flow cytometry on the aspirate which is by definition multiple myeloma.  I suspect that there would be a lot more plasma cells on the biopsy itself. I requested Dr. Lorenso Courier our hematologist who is an expert on myeloma to counsel her about treatment options and discuss her preferences for treatment.

## 2019-03-15 NOTE — Plan of Care (Signed)
  Problem: Education: Goal: Ability to demonstrate management of disease process will improve Outcome: Progressing Goal: Ability to verbalize understanding of medication therapies will improve Outcome: Progressing Goal: Individualized Educational Video(s) Outcome: Progressing   Problem: Activity: Goal: Capacity to carry out activities will improve Outcome: Progressing   Problem: Cardiac: Goal: Ability to achieve and maintain adequate cardiopulmonary perfusion will improve Outcome: Progressing   Problem: Education: Goal: Knowledge of General Education information will improve Description: Including pain rating scale, medication(s)/side effects and non-pharmacologic comfort measures Outcome: Progressing   Problem: Health Behavior/Discharge Planning: Goal: Ability to manage health-related needs will improve Outcome: Progressing   Problem: Clinical Measurements: Goal: Ability to maintain clinical measurements within normal limits will improve Outcome: Progressing Goal: Will remain free from infection Outcome: Progressing Goal: Diagnostic test results will improve Outcome: Progressing Goal: Respiratory complications will improve Outcome: Progressing Goal: Cardiovascular complication will be avoided Outcome: Progressing   Problem: Activity: Goal: Risk for activity intolerance will decrease Outcome: Progressing   Problem: Nutrition: Goal: Adequate nutrition will be maintained Outcome: Progressing   Problem: Coping: Goal: Level of anxiety will decrease Outcome: Progressing   Problem: Elimination: Goal: Will not experience complications related to bowel motility Outcome: Progressing Goal: Will not experience complications related to urinary retention Outcome: Progressing   Problem: Pain Managment: Goal: General experience of comfort will improve Outcome: Progressing   Problem: Safety: Goal: Ability to remain free from injury will improve Outcome: Progressing    Problem: Skin Integrity: Goal: Risk for impaired skin integrity will decrease Outcome: Progressing   Problem: Education: Goal: Understanding of CV disease, CV risk reduction, and recovery process will improve Outcome: Progressing Goal: Individualized Educational Video(s) Outcome: Progressing   Problem: Activity: Goal: Ability to return to baseline activity level will improve Outcome: Progressing   Problem: Cardiovascular: Goal: Ability to achieve and maintain adequate cardiovascular perfusion will improve Outcome: Progressing Goal: Vascular access site(s) Level 0-1 will be maintained Outcome: Progressing   Problem: Health Behavior/Discharge Planning: Goal: Ability to safely manage health-related needs after discharge will improve Outcome: Progressing   

## 2019-03-15 NOTE — Progress Notes (Signed)
Brief Progress Note  [name] is a 59 year old female with medical history significant for CKD, DM type II who presented with massive fluid overload and was found to have a Cr of 5. She is currently undergoing HD and as part of her workup monoclonal gammopathy was assessed.  Labs showed: serum IgG kappa M protein 1.2, kappa 1392, lambda 51.8 and ratio 26.88. Additionally beta 2 microglobulin was found to be 9.6. Fortunately Bone survey on 03/03/2019 showed no evidence of lytic lesions.   Preliminary results of bone marrow biopsy were consistent with 14% plasma cells on flow cytometry of the aspirate. Final results of the core biopsy are currently pending. The current findings are consistent with the diagnosis of multiple myeloma.  I have explained these findings to the patient and her husband. We discussed that this is a hematological malignancy and would require treatment with chemotherapy. At this time I would favor treatment with CyBorD vs Bortezomib/Rev/Dex. CyBorD would be favored in the setting of her very poor kidney function and would help to control her disease quickly, after which time the regimen could be altered to a revlimid based triplet. After discussing with the patient her preference of where she would want to receive treatment, she noted she would want to return to [facility] in [place] under the care of Dr. Delton Coombes.   At this time would prefer medical optimization prior to attempting to start chemotherapy. In the event that kidney function fails to improve enough to have her d/c into the outpatient setting we could consider initiating therapy in house.   Hematology will continue to follow.  [name], MD Department of Hematology/Oncology [place] at [facility] Phone: [phone] Pager: [phone] Email: [name]

## 2019-03-15 NOTE — Progress Notes (Signed)
ANTICOAGULATION CONSULT NOTE - [place] for heparin Indication: atrial fibrillation  Allergies  Allergen Reactions  . Wheat Swelling  . Latex Rash  . Penicillins Rash  . Sulfa Antibiotics Rash    Patient Measurements: Height: 5\' 6"  (167.6 cm) Weight: 198 lb 3.1 oz (89.9 kg) IBW/kg (Calculated) : 59.3  Vital Signs: Temp: 97.8 F (36.6 C) (01/18 1300) Temp Source: Oral (01/18 1300) BP: 100/60 (01/18 1500) Pulse Rate: 61 (01/18 1500)  Labs: Recent Labs    03/13/19 0213 03/13/19 0213 03/14/19 0231 03/14/19 0231 03/14/19 1244 03/15/19 0644 03/15/19 1335  HGB 9.6*   < > 9.6*  --   --  9.6*  --   HCT 27.8*  --  28.0*  --   --  28.5*  --   PLT 144*  --  128*  --   --  150  --   LABPROT 23.7*  --  22.9*  --   --  19.4*  --   INR 2.1*  --  2.0*  --   --  1.7*  --   HEPARINUNFRC  --   --  <0.10*   < > 0.23* 0.31 0.35  CREATININE 3.95*  --  3.18*  --   --  3.39*  --    < > = values in this interval not displayed.    Estimated Creatinine Clearance: 20.2 mL/min (A) (by C-G formula based on SCr of 3.39 mg/dL (H)).   Assessment: 59 yo female with hx AFib, Coumadin on hold, continue heparin bridge.   INR down to 1.7 today. CBC stable. Heparin level within goal range, no overt bleeding or complications noted.   PM follow up - heparin level remains at goal.  Goal of Therapy:  Heparin level goal 0.3-0.7 INR 2-3 Monitor platelets by anticoagulation protocol: Yes   Plan:  Continue IV heparin at current rate. Confirm heparin level at 2 pm. Daily heparin level and CBC. F/u plans to resume Coumadin once all procedures done.  Marguerite Olea, [facility] Clinical Pharmacist Phone [phone]  03/15/2019 3:32 PM

## 2019-03-15 NOTE — Progress Notes (Signed)
Patient ID: [name], female   DOB: 26-Jan-1961, 59 y.o.   MRN: [id number]     Advanced Heart Failure Rounding Note  PCP-Cardiologist: Kate Sable, MD   Subjective:    Dobutamine 2.5 started empirically on 1/11.  She has had 2 HD sessions, held HD yesterday and tried high dose IV Lasix + metolazone bid, only 525 cc urine recorded.   Still with leg pain bilaterally, she thinks this is from swelling.  No dyspnea.   Echo: EF < 20%, no LVH, mildly reduced RV systolic function, severe biatrial enlargement, severe TR.  RHC Procedural Findings (dobutamine 2.5 mcg/kg/min): Hemodynamics (mmHg) RA 18 RV 43/18 PA 45/19, mean 28 PCWP mean 19 Oxygen saturations: PA 63% AO 94% Cardiac Output (Fick) 6.1  Cardiac Index (Fick) 3.06 PVR 1.5 WU PAPI 1.4   Objective:   Weight Range: 87.3 kg Body mass index is 31.06 kg/m.   Vital Signs:   Temp:  [97.5 F (36.4 C)-98.3 F (36.8 C)] 97.5 F (36.4 C) (01/18 0747) Pulse Rate:  [59-60] 60 (01/18 0747) Resp:  [14-20] 14 (01/18 0747) BP: (96-103)/(55-62) 99/62 (01/18 0747) SpO2:  [90 %-100 %] 90 % (01/18 0747) Weight:  [87.3 kg] 87.3 kg (01/18 0330) Last BM Date: 03/13/19  Weight change: Filed Weights   03/13/19 1231 03/14/19 0450 03/15/19 0330  Weight: 84.8 kg 85.9 kg 87.3 kg    Intake/Output:   Intake/Output Summary (Last 24 hours) at 03/15/2019 0943 Last data filed at 03/14/2019 2219 Gross per 24 hour  Intake 647.21 ml  Output 525 ml  Net 122.21 ml      Physical Exam    General: NAD Neck: JVP 14 cm with prominent CV waves, no thyromegaly or thyroid nodule.  Lungs: Clear to auscultation bilaterally with normal respiratory effort. CV: Lateral PMI.  Heart regular S1/S2, +S3, 3/6 HSM apex.  1+ edema 1/2 to knees bilaterally.   Abdomen: Soft, nontender, no hepatosplenomegaly, no distention.  Skin: Intact without lesions or rashes.  Neurologic: Alert and oriented x 3.  Psych: Normal affect. Extremities: No clubbing  or cyanosis.  HEENT: Normal.    Telemetry   Atrial fibrillation with BiV pacing, rate 60s (personally reviewed)  Labs    CBC Recent Labs    03/13/19 0213 03/13/19 0213 03/14/19 0231 03/15/19 0644  WBC 8.8   < > 9.2 9.8  NEUTROABS 7.5  --   --   --   HGB 9.6*   < > 9.6* 9.6*  HCT 27.8*   < > 28.0* 28.5*  MCV 79.7*   < > 80.7 82.8  PLT 144*   < > 128* 150   < > = values in this interval not displayed.   Basic Metabolic Panel Recent Labs    03/14/19 0231 03/15/19 0644  NA 131* 130*  K 3.9 4.3  CL 95* 94*  CO2 24 22  GLUCOSE 173* 127*  BUN 65* 74*  CREATININE 3.18* 3.39*  CALCIUM 7.4* 7.5*  PHOS 4.8* 5.9*   Liver Function Tests Recent Labs    03/14/19 0231 03/15/19 0644  ALBUMIN 2.4* 2.3*   No results for input(s): LIPASE, AMYLASE in the last 72 hours. Cardiac Enzymes No results for input(s): CKTOTAL, CKMB, CKMBINDEX, TROPONINI in the last 72 hours.  BNP: BNP (last 3 results) Recent Labs    03/07/19 2101  BNP 1,072.0*    ProBNP (last 3 results) No results for input(s): PROBNP in the last 8760 hours.   D-Dimer No results for input(s): DDIMER  in the last 72 hours. Hemoglobin A1C No results for input(s): HGBA1C in the last 72 hours. Fasting Lipid Panel No results for input(s): CHOL, HDL, LDLCALC, TRIG, CHOLHDL, LDLDIRECT in the last 72 hours. Thyroid Function Tests No results for input(s): TSH, T4TOTAL, T3FREE, THYROIDAB in the last 72 hours.  Invalid input(s): FREET3  Other results:   Imaging    No results found.   Medications:     Scheduled Medications: . calcitRIOL  0.5 mcg Oral Daily  . Chlorhexidine Gluconate Cloth  6 each Topical Q0600  . Chlorhexidine Gluconate Cloth  6 each Topical Q0600  . cholecalciferol  1,000 Units Oral Daily  . insulin aspart  0-9 Units Subcutaneous TID WC  . lidocaine  10 mL Intradermal Once  . metolazone  2.5 mg Oral BID  . midodrine  10 mg Oral TID WC  . sodium chloride flush  3 mL Intravenous  Q12H  . sodium chloride flush  3 mL Intravenous Q12H  . vitamin B-12  100 mcg Oral Daily    Infusions: . sodium chloride 10 mL/hr at 03/14/19 1615  . DOBUTamine 2.5 mcg/kg/min (03/15/19 6759)  . ferric gluconate (FERRLECIT/NULECIT) IV 110 mL/hr at 03/14/19 1615  . furosemide Stopped (03/14/19 2215)  . heparin 1,500 Units/hr (03/14/19 1615)    PRN Medications: sodium chloride, acetaminophen **OR** acetaminophen, fentaNYL (SUBLIMAZE) injection, heparin, lidocaine, ondansetron **OR** ondansetron (ZOFRAN) IV, sodium chloride, sodium chloride flush, traMADol    Assessment/Plan   1. Acute on chronic systolic CHF: Longstanding NICM.  Last echo in 2/19 with EF 15-20%. She has a [employer] CRT-D device. Admitted with progressive dyspnea and 30 lb weight gain. She is very volume overloaded on exam, complicated by SBP in 16B and AKI on CKD stage 4. Empirically started on dobutamine 2.5 due to concern for low output. RHC showed R>L heart failure with low PAPI adequate cardiac output on dobutamine.  She had HD on 1/15 and 1/16, tolerated well.  HD held yesterday and continued on high dose IV Lasix + metolazone bid, only 525 cc urine recorded.  She is still volume overloaded on exam. I think she is going to require HD.  - Discussed with nephrology, think she'll need HD today for volume removal.   - Suspect she will be HD dependent, will titrate down on dobutamine to 1 today and hopefully off tomorrow.    - Midodrine 10 mg tid with relative hypotension.  - Stopped digoxin and benazepril with AKI.  - Stopped beta blocker with concern for low output.  - No BP room for hydralazine/nitrates.  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  2. AKI on CKD stage 4: Baseline creatinine around 3, went up to 5 with BUN 150 this admission. Suspect cardiorenal syndrome superimposed on possible diabetic nephropathy.  She is unfortunately very volume overloaded  as well.  Some response to high dose IV Lasix boluses initially but not much UOP yesterday.  - Discussed with Dr. Joelyn Oms, HD today.  Think she will be HD dependent.  3. Chronic atrial fibrillation: Warfarin held for procedures, INR 1.7.  Cover with heparin gtt for now, restart warfarin when we know she will have no more procedures.  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  5. DM: Per primary.  6. Monoclonal gammopathy:  LV morphology does not look concerning for AL amyloidosis. She has been seen by hematology, has had bone marrow bx and awaiting results.   7. Tricuspid regurgitation: Severe by  echo.   Length of Stay: McNeal, MD  03/15/2019, 9:43 AM  Advanced Heart Failure Team Pager [phone] (M-F; 7a - 4p)  Please contact [facility] for night-coverage after hours (4p -7a ) and weekends on [email or web address]

## 2019-03-15 NOTE — Progress Notes (Signed)
PT Cancellation Note  Patient Details Name: [name] MRN: [id number] DOB: 1960-04-28   Cancelled Treatment:    Reason Eval/Treat Not Completed: Patient at procedure or test/unavailable at HD. Will attempt to try to return if time/schedule allow.    Windell Norfolk, DPT, PN1   Supplemental Physical Therapist [facility]    Pager [phone] Acute Rehab Office [phone]

## 2019-03-16 LAB — RENAL FUNCTION PANEL
Albumin: 2.2 g/dL — ABNORMAL LOW (ref 3.5–5.0)
Anion gap: 15 (ref 5–15)
BUN: 42 mg/dL — ABNORMAL HIGH (ref 6–20)
CO2: 22 mmol/L (ref 22–32)
Calcium: 7.8 mg/dL — ABNORMAL LOW (ref 8.9–10.3)
Chloride: 94 mmol/L — ABNORMAL LOW (ref 98–111)
Creatinine, Ser: 2.8 mg/dL — ABNORMAL HIGH (ref 0.44–1.00)
GFR calc Af Amer: 21 mL/min — ABNORMAL LOW (ref >60)
GFR calc non Af Amer: 18 mL/min — ABNORMAL LOW (ref >60)
Glucose, Bld: 125 mg/dL — ABNORMAL HIGH (ref 70–99)
Phosphorus: 4.7 mg/dL — ABNORMAL HIGH (ref 2.5–4.6)
Potassium: 3.9 mmol/L (ref 3.5–5.1)
Sodium: 131 mmol/L — ABNORMAL LOW (ref 135–145)

## 2019-03-16 LAB — CBC
HCT: 27.4 % — ABNORMAL LOW (ref 36.0–46.0)
Hemoglobin: 9.2 g/dL — ABNORMAL LOW (ref 12.0–15.0)
MCH: 27.3 pg (ref 26.0–34.0)
MCHC: 33.6 g/dL (ref 30.0–36.0)
MCV: 81.3 fL (ref 80.0–100.0)
Platelets: 184 10*3/uL (ref 150–400)
RBC: 3.37 MIL/uL — ABNORMAL LOW (ref 3.87–5.11)
RDW: 16.2 % — ABNORMAL HIGH (ref 11.5–15.5)
WBC: 12.3 10*3/uL — ABNORMAL HIGH (ref 4.0–10.5)
nRBC: 0.2 % (ref 0.0–0.2)

## 2019-03-16 LAB — PROTIME-INR
INR: 1.7 — ABNORMAL HIGH (ref 0.8–1.2)
Prothrombin Time: 19.7 seconds — ABNORMAL HIGH (ref 11.4–15.2)

## 2019-03-16 LAB — GLUCOSE, CAPILLARY
Glucose-Capillary: 138 mg/dL — ABNORMAL HIGH (ref 70–99)
Glucose-Capillary: 191 mg/dL — ABNORMAL HIGH (ref 70–99)
Glucose-Capillary: 115 mg/dL — ABNORMAL HIGH (ref 70–99)
Glucose-Capillary: 192 mg/dL — ABNORMAL HIGH (ref 70–99)

## 2019-03-16 LAB — HEPARIN LEVEL (UNFRACTIONATED): Heparin Unfractionated: 0.33 IU/mL (ref 0.30–0.70)

## 2019-03-16 MED ORDER — HEPARIN SODIUM (PORCINE) 1000 UNIT/ML IJ SOLN
INTRAMUSCULAR | Status: AC
  Filled 2019-03-16: qty 3

## 2019-03-16 MED ORDER — ALBUMIN HUMAN 25 % IV SOLN: 25.0000 g | Freq: Once | INTRAVENOUS | Status: AC

## 2019-03-16 MED ORDER — ALBUMIN HUMAN 25 % IV SOLN
INTRAVENOUS | Status: AC
  Administered 2019-03-16: 25 g via INTRAVENOUS
  Filled 2019-03-16: qty 100

## 2019-03-16 MED ORDER — LIDOCAINE HCL (PF) 1 % IJ SOLN
5.0000 mL | Freq: Once | INTRAMUSCULAR | Status: DC
  Filled 2019-03-16: qty 5

## 2019-03-16 NOTE — Progress Notes (Addendum)
  Painful indurated areas on legs/abdomen. Possible Calciphylaxis.  General Surgery Consulted for biopsy.  She is agreeable.    Tymier Lindholm NP-C_ 11:28 AM

## 2019-03-16 NOTE — Plan of Care (Signed)
  Problem: Education: Goal: Ability to demonstrate management of disease process will improve Outcome: Progressing Goal: Ability to verbalize understanding of medication therapies will improve Outcome: Progressing Goal: Individualized Educational Video(s) Outcome: Progressing   Problem: Activity: Goal: Capacity to carry out activities will improve Outcome: Progressing   Problem: Cardiac: Goal: Ability to achieve and maintain adequate cardiopulmonary perfusion will improve Outcome: Progressing   Problem: Education: Goal: Knowledge of General Education information will improve Description: Including pain rating scale, medication(s)/side effects and non-pharmacologic comfort measures Outcome: Progressing   Problem: Health Behavior/Discharge Planning: Goal: Ability to manage health-related needs will improve Outcome: Progressing   Problem: Clinical Measurements: Goal: Ability to maintain clinical measurements within normal limits will improve Outcome: Progressing Goal: Will remain free from infection Outcome: Progressing Goal: Diagnostic test results will improve Outcome: Progressing Goal: Respiratory complications will improve Outcome: Progressing Goal: Cardiovascular complication will be avoided Outcome: Progressing   Problem: Activity: Goal: Risk for activity intolerance will decrease Outcome: Progressing   Problem: Nutrition: Goal: Adequate nutrition will be maintained Outcome: Progressing   Problem: Coping: Goal: Level of anxiety will decrease Outcome: Progressing   Problem: Elimination: Goal: Will not experience complications related to bowel motility Outcome: Progressing Goal: Will not experience complications related to urinary retention Outcome: Progressing   Problem: Pain Managment: Goal: General experience of comfort will improve Outcome: Progressing   Problem: Safety: Goal: Ability to remain free from injury will improve Outcome: Progressing    Problem: Skin Integrity: Goal: Risk for impaired skin integrity will decrease Outcome: Progressing   Problem: Education: Goal: Understanding of CV disease, CV risk reduction, and recovery process will improve Outcome: Progressing Goal: Individualized Educational Video(s) Outcome: Progressing   Problem: Activity: Goal: Ability to return to baseline activity level will improve Outcome: Progressing   Problem: Cardiovascular: Goal: Ability to achieve and maintain adequate cardiovascular perfusion will improve Outcome: Progressing Goal: Vascular access site(s) Level 0-1 will be maintained Outcome: Progressing   Problem: Health Behavior/Discharge Planning: Goal: Ability to safely manage health-related needs after discharge will improve Outcome: Progressing   

## 2019-03-16 NOTE — Progress Notes (Signed)
OP HD referral submitted to [facility] to request treatment at [facility] in [place]. Renal Navigator to follow.  Alphonzo Cruise, [place] Renal Navigator [phone]

## 2019-03-16 NOTE — Progress Notes (Addendum)
PT Cancellation Note  Patient Details Name: [name] MRN: [id number] DOB: 04-16-1960   Cancelled Treatment:    Reason Eval/Treat Not Completed: Pain limiting ability to participate pt declines PT evaluation due to pain. "They need to figure out this pain medication before I do any moving." [name] follow.  On second attempt, pt at HD. [name] follow.  Marguarite Arbour A Cande Mastropietro 03/16/2019, 11:00 AM Marisa Severin, PT, DPT Acute Rehabilitation Services Pager [phone] Office [phone]

## 2019-03-16 NOTE — Progress Notes (Signed)
OT Cancellation Note  Patient Details Name: [name] MRN: [id number] DOB: 14-Oct-1960   Cancelled Treatment:    Reason Eval/Treat Not Completed: Other (comment);Patient at procedure or test/ unavailable; pt initially declining participation in therapies due to pain, noted pt now in HD. Will follow up for OT eval as able.  Lou Cal, OT Supplemental Rehabilitation Services Pager [phone] Office [phone]   Raymondo Band 03/16/2019, 12:08 PM

## 2019-03-16 NOTE — Progress Notes (Deleted)
  Painful indurated areas on legs/abdomen.   Possible Calciphylaxis.  Consult IR for biopsy of abdomen.   Ewelina Naves NP-C_ 11:22 AM

## 2019-03-16 NOTE — Progress Notes (Signed)
PROGRESS NOTE    [name]  UXL:[id number] DOB: 1961/01/21 DOA: 03/07/2019 PCP: [name], MD   Brief Narrative: 59 year old with past medical history significant for MGUS, thrombocytopenia, A. fib on Coumadin, CHB with pacemaker, nonischemic cardiomyopathy with ejection fraction 15 to 20% with ICD, CKD stage IV baseline creatinine 3.0, cirrhosis who presented with several weeks of progressive swelling about 30 pound weight gain.  Evaluation in the ED patient was found to have a creatinine more than 5, anasarca, hemoglobin of 11.  Chest x-ray was clear but she required 2 L of supplemental oxygen.  Patient received 40 mg of IV Lasix without significant urine output.  Patient was transferred to [facility] for heart failure team evaluation, patient was a started on inotrope. Patient did not respond to inotrope and IV Lasix. She was a started on hemodialysis.  She underwent evaluation for multiple myeloma due to abnormal SPEP UPEP.  Underwent bone marrow biopsy preliminary report showed 14 % plasma cell.  Oncology is following and are considering to start chemotherapy as an outpatient.     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 She was treated with dobutamine drip.  Now discontinued. Continue with IV Laxis 160 mg TID>  Plan to continue with current management.  Cath on 03-10-2019; PA 45/19.  ECHO Ef 20 % , Severe TR.  Underwent HD 1-15 and again 1-16. On Midodrine.  Weight; 200---189.--192.--202 Plan for hemodialysis today 1-19  2-Acute on chronic renal failure stage  IV: Prior creatinine 2.9--3.5 last year. Considering cardiorenal syndrome. Nephrology following for need of CRRT.  Underwent HD 1-15 and 1-6, 1/18 for large volume removal.  No significant urine out put, weight up. Plan  for HD today. 1-19   3-Anemia; MGUS with Thrombocytopenia;  Follow up with Dr Delton Coombes.  M spike 22/28. Kappa free light chain 1392. B 2 microglobulin 9.6.  electrophoresis protein and kappa/lamda ratio : 43, M spike 1.2.  Appreciate Oncology assistance.  Underwent BM Biopsy 1-15. Preliminary report per Dr. Lindi Adie note BM preliminary report with 14 % plasma cell  which define MM.  Dr. Lorenso Courier, evaluated patient.  Plan to start chemotherapy as an outpatient for multiple myeloma.  4-Chronic A fib; Coumadin on hold, in case require procedure.  Plan to start heparin when INR less than 2 On heparin gtt.   5-Diabetes; SSI for now due to hypoglycemia.   6-Hyponatremia; related to hypervolemia.  Continue with diuresis.   7-Abdominal Firmness, CT negative for Hematoma.  Postraumatic seroma/mild Sherry Ruffing lesion, patient denies any recent trauma. Probably old injury.   8-Secondary Hyperparathyroidism; Hyperkalemia; mild on lasix. Resolved.  Hyponatremia; related to renal failure, heart failure.   Right LE pain, edema; doppler negative. 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 Cirrhosis.     Estimated body mass index is 32.63 kg/m as calculated from the following:   Height as of this encounter: 5' 6"  (1.676 m).   Weight as of this encounter: 91.7 kg.   DVT prophylaxis: On Coumadin Code Status: Full code Family Communication: Care discussed with patient Disposition Plan:  Remain in  the hospital for management of HF, renal failure. Requiring HD currently, and evaluation for MM.  Consultants:   Cardiology  Nephrology   Procedures:   ECHO 20 %    Antimicrobials:  None  Subjective: Complaints of pain in legs and abdomen.    Objective: Vitals:   03/16/19 1230 03/16/19 1300 03/16/19 1330 03/16/19 1400  BP: (!) 101/55 110/64 (!) 98/54 109/62  Pulse: 60 60 60 60  Resp:      Temp:      TempSrc:      SpO2:      Weight:      Height:        Intake/Output Summary (Last 24 hours) at 03/16/2019 1445 Last data filed at 03/16/2019 1100 Gross per 24 hour  Intake 978.27 ml  Output 3000 ml  Net -2021.73 ml   Filed Weights   03/15/19 1636 03/16/19 0500 03/16/19 1145  Weight: 86.6 kg 85.6 kg 91.7 kg    Examination:  General exam: NAD Respiratory system: BL crackles.  Cardiovascular system: S 1, S 2 RRR Gastrointestinal system: BS present, soft, nt Central nervous system: Alert, follows command Extremities: Plus 2 edema     Data Reviewed: I have personally reviewed following labs and imaging studies  CBC: Recent Labs  Lab 03/10/19 0806 03/10/19 0806 03/11/19 0628 03/11/19 0628 03/12/19 0208 03/13/19 0213 03/14/19 0231 03/15/19 0644 03/16/19 0243  WBC 6.3   < > 6.8   < > 7.2 8.8 9.2 9.8 12.3*  NEUTROABS 5.0  --  5.5  --  5.7 7.5  --   --   --   HGB 9.8*   < > 10.1*   < > 9.5* 9.6* 9.6* 9.6* 9.2*  HCT 29.1*   < > 29.6*   < > 27.6* 27.8* 28.0* 28.5* 27.4*  MCV 80.8   < > 80.7   < > 80.0 79.7* 80.7 82.8 81.3  PLT 127*   < > 139*   < > 141* 144* 128* 150 184   < > = values in this interval not displayed.   Basic Metabolic Panel: Recent Labs  Lab 03/12/19 0208 03/13/19 0213 03/14/19 0231 03/15/19 0644 03/16/19 0243  NA 128* 130* 131* 130* 131*  K 4.0 3.8 3.9 4.3 3.9  CL 94* 93* 95* 94* 94*  CO2 19* 21* 24 22 22   GLUCOSE 150* 209* 173* 127* 125*  BUN 153* 106* 65* 74* 42*  CREATININE 5.06* 3.95* 3.18* 3.39* 2.80*  CALCIUM 7.0* 7.0* 7.4* 7.5* 7.8*  PHOS 7.3* 6.3* 4.8* 5.9* 4.7*   GFR: Estimated Creatinine Clearance: 24.7 mL/min (A) (by C-G formula based on SCr of 2.8 mg/dL (H)). Liver Function Tests: Recent Labs  Lab 03/12/19 0208 03/13/19 0213 03/14/19 0231 03/15/19 0644  03/16/19 0243  ALBUMIN 2.1* 2.3* 2.4* 2.3* 2.2*   No results for input(s): LIPASE, AMYLASE in the last 168 hours. No results for input(s): AMMONIA in the last 168 hours. Coagulation Profile: Recent Labs  Lab 03/12/19 0208 03/13/19 0213 03/14/19 0231 03/15/19 0644 03/16/19 0243  INR 2.5* 2.1* 2.0* 1.7* 1.7*   Cardiac Enzymes: No results for input(s): CKTOTAL, CKMB, CKMBINDEX, TROPONINI in the last 168 hours. BNP (last 3 results) No results for input(s): PROBNP in the last 8760 hours. HbA1C: No results for input(s): HGBA1C in the last 72 hours. CBG: Recent Labs  Lab 03/15/19 1122 03/15/19 1732 03/15/19 2104 03/16/19 0637 03/16/19 1113  GLUCAP 182* 126* 144* 138* 191*   Lipid Profile: No results for input(s): CHOL,  HDL, LDLCALC, TRIG, CHOLHDL, LDLDIRECT in the last 72 hours. Thyroid Function Tests: No results for input(s): TSH, T4TOTAL, FREET4, T3FREE, THYROIDAB in the last 72 hours. Anemia Panel: No results for input(s): VITAMINB12, FOLATE, FERRITIN, TIBC, IRON, RETICCTPCT in the last 72 hours. Sepsis Labs: No results for input(s): PROCALCITON, LATICACIDVEN in the last 168 hours.  Recent Results (from the past 240 hour(s))  Respiratory Panel by RT PCR (Flu A&B, Covid) - Nasopharyngeal Swab     Status: None   Collection Time: 03/07/19  9:21 PM   Specimen: Nasopharyngeal Swab  Result Value Ref Range Status   SARS Coronavirus 2 by RT PCR NEGATIVE NEGATIVE Final    Comment: (NOTE) SARS-CoV-2 target nucleic acids are NOT DETECTED. The SARS-CoV-2 RNA is generally detectable in upper respiratoy specimens during the acute phase of infection. The lowest concentration of SARS-CoV-2 viral copies this assay can detect is 131 copies/mL. A negative result does not preclude SARS-Cov-2 infection and should not be used as the sole basis for treatment or other patient management decisions. A negative result may occur with  improper specimen collection/handling, submission of  specimen other than nasopharyngeal swab, presence of viral mutation(s) within the areas targeted by this assay, and inadequate number of viral copies (<131 copies/mL). A negative result must be combined with clinical observations, patient history, and epidemiological information. The expected result is Negative. Fact Sheet for Patients:  PinkCheek.be Fact Sheet for Healthcare Providers:  GravelBags.it This test is not yet ap proved or cleared by the [place] FDA and  has been authorized for detection and/or diagnosis of SARS-CoV-2 by FDA under an Emergency Use Authorization (EUA). This EUA will remain  in effect (meaning this test can be used) for the duration of the COVID-19 declaration under Section 564(b)(1) of the Act, 21 U.S.C. section 360bbb-3(b)(1), unless the authorization is terminated or revoked sooner.    Influenza A by PCR NEGATIVE NEGATIVE Final   Influenza B by PCR NEGATIVE NEGATIVE Final    Comment: (NOTE) The Xpert Xpress SARS-CoV-2/FLU/RSV assay is intended as an aid in  the diagnosis of influenza from Nasopharyngeal swab specimens and  should not be used as a sole basis for treatment. Nasal washings and  aspirates are unacceptable for Xpert Xpress SARS-CoV-2/FLU/RSV  testing. Fact Sheet for Patients: PinkCheek.be Fact Sheet for Healthcare Providers: GravelBags.it This test is not yet approved or cleared by the [place] FDA and  has been authorized for detection and/or diagnosis of SARS-CoV-2 by  FDA under an Emergency Use Authorization (EUA). This EUA will remain  in effect (meaning this test can be used) for the duration of the  Covid-19 declaration under Section 564(b)(1) of the Act, 21  U.S.C. section 360bbb-3(b)(1), unless the authorization is  terminated or revoked. Performed at [facility], [address] [place], [place] [address]    MRSA PCR Screening     Status: None   Collection Time: 03/08/19 10:26 PM   Specimen: Nasopharyngeal  Result Value Ref Range Status   MRSA by PCR NEGATIVE NEGATIVE Final    Comment:        The GeneXpert MRSA Assay (FDA approved for NASAL specimens only), is one component of a comprehensive MRSA colonization surveillance program. It is not intended to diagnose MRSA infection nor to guide or monitor treatment for MRSA infections. Performed at [facility], [place] [address], [place], [place] [address]          Radiology Studies: No results found.  Scheduled Meds: . Chlorhexidine Gluconate Cloth  6 each Topical Q0600  . Chlorhexidine Gluconate Cloth  6 each Topical Q0600  . insulin aspart  0-9 Units Subcutaneous TID WC  . lidocaine (PF)  5 mL Other Once  . lidocaine  10 mL Intradermal Once  . metolazone  2.5 mg Oral BID  . midodrine  10 mg Oral TID WC  . sodium chloride flush  3 mL Intravenous Q12H  . sodium chloride flush  3 mL Intravenous Q12H  . vitamin B-12  100 mcg Oral Daily   Continuous Infusions: . sodium chloride 10 mL/hr at 03/14/19 1615  . ferric gluconate (FERRLECIT/NULECIT) IV 125 mg (03/16/19 1023)  . furosemide 160 mg (03/16/19 0935)  . heparin 1,500 Units/hr (03/16/19 0700)     LOS: 8 days    Time spent: 35 minutes.     Elmarie Shiley, MD [facility]   If 7PM-7AM, please contact night-coverage [email or web address] Password [facility] 03/16/2019, 2:45 PM

## 2019-03-16 NOTE — Progress Notes (Signed)
Subjective:    HD yesterday 3L UF, 0.4L UOP total on diuretics  Heme notes reviewed, confirmed myeloma  Has been having painful subcutaneious nodules in thighs and abdomen, none are open, present prior to admit  Was on warfarin PTA  P 4.7, Ca 7.8,   Not much UOP, 0.5L from dobut + lasix + metolazone  SCr up to 3.39,. K 4.3, PTH 111   Remains severely edematous into abdomen  Objective Vital signs in last 24 hours: Vitals:   03/16/19 0000 03/16/19 0357 03/16/19 0500 03/16/19 0747  BP:  (!) 97/56  (!) 111/99  Pulse: 60 61  (!) 59  Resp: 18 15  15   Temp:  98 F (36.7 C)  99 F (37.2 C)  TempSrc:  Oral  Oral  SpO2:  98%  90%  Weight:   85.6 kg   Height:       Weight change: 2.6 kg  Intake/Output Summary (Last 24 hours) at 03/16/2019 0934 Last data filed at 03/16/2019 0800 Gross per 24 hour  Intake 865.15 ml  Output 3000 ml  Net -2134.85 ml    Assessment/ Plan: Pt is a 59 y.o. yo female with nonischemic cardiomyopathy/MGUS/DM and baseline CKD (crt 3's)  who was admitted on 03/07/2019 with massive volume overload and crt 5  Assessment/Plan: 1. Renal-  A on CRF.  Had advanced CKD at baseline. Suspect cardiorenal vs DM bu also has M spike.  Per cards-  Heart does not look like amyloid.  Onc has seen, s/p bone marrow biopsy-multiple myeloma.   Now appears to be dialysis dependent since 1/15, I do not anticipate that she will recover and believe she will be ESRD.  Remains significantly hypervolemic, aggressive UF as hemodynamics permit.  Currently on MWF schedule, will need clip.  Eventual consideration of vascular access.  We will try for extra dialysis today given her hypervolemia, schedule permitting. 2. Vol/htn-  Very volume overloaded and relatively hypotensive-  As above. On midodrine 3. Anemia-multifactorial including #1, myeloma, hematology following.  ESA dosing at their discretion  4. 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  She is on calcitriol which we will stop.  We will also stop cholecalciferol.  Calciphylaxis would be unusual in the circumstances but certainly on the differential.  I think it might require a biopsy to sort this all out.  I would not resume warfarin moving forward.  Pain control per primary. 5. Hyperkalemia-  Resolved    6. Hyponatremia-  Likely hypervolemic hyponatremia-  Improving as volume does    Rexene Agent    Labs: Basic Metabolic Panel: Recent Labs  Lab 03/14/19 0231 03/15/19 0644 03/16/19 0243  NA 131* 130* 131*  K 3.9 4.3 3.9  CL 95* 94* 94*  CO2 24 22 22   GLUCOSE 173* 127* 125*  BUN 65* 74* 42*  CREATININE 3.18* 3.39* 2.80*  CALCIUM 7.4* 7.5* 7.8*  PHOS 4.8* 5.9* 4.7*   Liver Function Tests: Recent Labs  Lab 03/14/19 0231 03/15/19 0644 03/16/19 0243  ALBUMIN 2.4* 2.3* 2.2*   No results for input(s): LIPASE, AMYLASE in the last 168 hours. No results for input(s): AMMONIA in the last 168 hours. CBC: Recent Labs  Lab 03/11/19 3664 03/11/19 4034 03/12/19 0208 03/12/19 0208 03/13/19 0213 03/13/19 0213 03/14/19 0231 03/15/19 0644 03/16/19 0243  WBC 6.8   < >  7.2   < > 8.8   < > 9.2 9.8 12.3*  NEUTROABS 5.5  --  5.7  --  7.5  --   --   --   --   HGB 10.1*   < > 9.5*   < > 9.6*   < > 9.6* 9.6* 9.2*  HCT 29.6*   < > 27.6*   < > 27.8*   < > 28.0* 28.5* 27.4*  MCV 80.7   < > 80.0  --  79.7*  --  80.7 82.8 81.3  PLT 139*   < > 141*   < > 144*   < > 128* 150 184   < > = values in this interval not displayed.   Cardiac Enzymes: No results for input(s): CKTOTAL, CKMB, CKMBINDEX, TROPONINI in the last 168 hours. CBG: Recent Labs  Lab 03/15/19 0623 03/15/19 1122 03/15/19 1732 03/15/19 2104 03/16/19 0637  GLUCAP 115* 182* 126* 144* 138*    Iron Studies:  No results for input(s): IRON, TIBC, TRANSFERRIN,  FERRITIN in the last 72 hours. Studies/Results: No results found. Medications: Infusions: . sodium chloride 10 mL/hr at 03/14/19 1615  . ferric gluconate (FERRLECIT/NULECIT) IV Stopped (03/15/19 1659)  . furosemide Stopped (03/15/19 2224)  . heparin 1,500 Units/hr (03/16/19 0700)    Scheduled Medications: . calcitRIOL  0.5 mcg Oral Daily  . Chlorhexidine Gluconate Cloth  6 each Topical Q0600  . Chlorhexidine Gluconate Cloth  6 each Topical Q0600  . cholecalciferol  1,000 Units Oral Daily  . insulin aspart  0-9 Units Subcutaneous TID WC  . lidocaine  10 mL Intradermal Once  . metolazone  2.5 mg Oral BID  . midodrine  10 mg Oral TID WC  . sodium chloride flush  3 mL Intravenous Q12H  . sodium chloride flush  3 mL Intravenous Q12H  . vitamin B-12  100 mcg Oral Daily    have reviewed scheduled and prn medications.  Physical Exam: General: alert, pleasan Heart: RRR Lungs: dec BS at bases Abdomen: soft, non tender Extremities: pitting edema throughout    03/16/2019,9:34 AM  LOS: 8 days

## 2019-03-16 NOTE — Progress Notes (Signed)
ANTICOAGULATION CONSULT NOTE - [place] for heparin Indication: atrial fibrillation  Allergies  Allergen Reactions  . Wheat Swelling  . Latex Rash  . Penicillins Rash  . Sulfa Antibiotics Rash    Patient Measurements: Height: 5\' 6"  (167.6 cm) Weight: 188 lb 11.4 oz (85.6 kg) IBW/kg (Calculated) : 59.3  Vital Signs: Temp: 98 F (36.7 C) (01/19 0357) Temp Source: Oral (01/19 0357) BP: 97/56 (01/19 0357) Pulse Rate: 61 (01/19 0357)  Labs: Recent Labs    03/14/19 0231 03/14/19 1244 03/15/19 0644 03/15/19 1335 03/16/19 0243  HGB 9.6*  --  9.6*  --  9.2*  HCT 28.0*  --  28.5*  --  27.4*  PLT 128*  --  150  --  184  LABPROT 22.9*  --  19.4*  --  19.7*  INR 2.0*  --  1.7*  --  1.7*  HEPARINUNFRC <0.10*   < > 0.31 0.35 0.33  CREATININE 3.18*  --  3.39*  --  2.80*   < > = values in this interval not displayed.    Estimated Creatinine Clearance: 23.8 mL/min (A) (by C-G formula based on SCr of 2.8 mg/dL (H)).   Assessment: 59 yo female with hx AFib, Coumadin on hold, continue heparin bridge.   INR down to 1.7 today. CBC stable. Heparin level within goal range, no overt bleeding or complications noted.   Goal of Therapy:  Heparin level goal 0.3-0.7 INR 2-3 Monitor platelets by anticoagulation protocol: Yes   Plan:  Continue IV heparin at current rate. Daily heparin level and CBC. F/u plans to resume Coumadin once all procedures done- could we resume today?  Marguerite Olea, [facility] Clinical Pharmacist Phone [phone]  03/16/2019 7:22 AM

## 2019-03-16 NOTE — Progress Notes (Signed)
Back from hemodialysis by bed awake and alert. 

## 2019-03-16 NOTE — Progress Notes (Signed)
Transported by bed to dialysis awake and alert.

## 2019-03-16 NOTE — Progress Notes (Signed)
Renal Navigator was notified by Nephrologist/Dr. Joelyn Oms to refer patient for OP HD treatment. Renal Navigator called patient, who stated she was not feeling well and could not talk at this time. She handed the phone to her husband, but he was unable to provide any information. Renal Navigator's main question at this point is what Nephrology group patient follows with as there are two OP HD clinics in [place]. Patient's husband states she used to be a patient of Dr. Hinda Lenis, but now that he has retired, she follows with a "female doctor in [place], [place] at [place]." He cannot recall the name. He asked Navigator to "check the chart and go from there." Renal Navigator spoke with both [facility] and [facility] and found that patient is currently being cared for by Dr. Theador Hawthorne (female) in [place], since Dr. Rhona Leavens retirement, and will therefore complete referral to [facility] in [place] for OP HD treatment.  Renal Navigator will follow up.  [name], [place] Renal Navigator [phone]

## 2019-03-16 NOTE — Progress Notes (Addendum)
Patient ID: [name], female   DOB: Oct 17, 1960, 59 y.o.   MRN: [id number]     Advanced Heart Failure Rounding Note  PCP-Cardiologist: Kate Sable, MD   Subjective:    Yesterday dobutamine cut back to 1 mcg. Tolerated HD 03/15/19.  Complaining of leg pain. Says she took had no relief with IV fentanyl and felt like her legs were on fire. Denies SOB.    Echo: EF < 20%, no LVH, mildly reduced RV systolic function, severe biatrial enlargement, severe TR.  RHC Procedural Findings (dobutamine 2.5 mcg/kg/min): Hemodynamics (mmHg) RA 18 RV 43/18 PA 45/19, mean 28 PCWP mean 19 Oxygen saturations: PA 63% AO 94% Cardiac Output (Fick) 6.1  Cardiac Index (Fick) 3.06 PVR 1.5 WU PAPI 1.4   Objective:   Weight Range: 85.6 kg Body mass index is 30.46 kg/m.   Vital Signs:   Temp:  [97.5 F (36.4 C)-98.1 F (36.7 C)] 98 F (36.7 C) (01/19 0357) Pulse Rate:  [59-91] 61 (01/19 0357) Resp:  [14-23] 15 (01/19 0357) BP: (87-109)/(51-66) 97/56 (01/19 0357) SpO2:  [90 %-100 %] 98 % (01/19 0357) Weight:  [85.6 kg-89.9 kg] 85.6 kg (01/19 0500) Last BM Date: 03/15/19  Weight change: Filed Weights   03/15/19 1300 03/15/19 1636 03/16/19 0500  Weight: 89.9 kg 86.6 kg 85.6 kg    Intake/Output:   Intake/Output Summary (Last 24 hours) at 03/16/2019 0710 Last data filed at 03/15/2019 1636 Gross per 24 hour  Intake 308.68 ml  Output 3400 ml  Net -3091.32 ml      Physical Exam   General:  . No resp difficulty HEENT: normal Neck: supple. JVD to jaw . Carotids 2+ bilat; no bruits. No lymphadenopathy or thryomegaly appreciated. Cor: PMI nondisplaced. Irregular rate & rhythm. No rubs, gallops or murmurs. R upper chest HF catheter.  Lungs: clear Abdomen: Indurated areas on abdomen Tender to touch, nondistended. No hepatosplenomegaly. No bruits or masses. Good bowel sounds. Extremities: no cyanosis, clubbing, rash, R and LLE 2+ edema. Indurated areas on upper thighs tenger to  tought.  Neuro: alert & orientedx3, cranial nerves grossly intact. moves all 4 extremities w/o difficulty. Affect pleasant   Telemetry   A Fib Bi V pacing. 60-90s  Personally reviewed.   Labs    CBC Recent Labs    03/15/19 0644 03/16/19 0243  WBC 9.8 12.3*  HGB 9.6* 9.2*  HCT 28.5* 27.4*  MCV 82.8 81.3  PLT 150 564   Basic Metabolic Panel Recent Labs    03/15/19 0644 03/16/19 0243  NA 130* 131*  K 4.3 3.9  CL 94* 94*  CO2 22 22  GLUCOSE 127* 125*  BUN 74* 42*  CREATININE 3.39* 2.80*  CALCIUM 7.5* 7.8*  PHOS 5.9* 4.7*   Liver Function Tests Recent Labs    03/15/19 0644 03/16/19 0243  ALBUMIN 2.3* 2.2*   No results for input(s): LIPASE, AMYLASE in the last 72 hours. Cardiac Enzymes No results for input(s): CKTOTAL, CKMB, CKMBINDEX, TROPONINI in the last 72 hours.  BNP: BNP (last 3 results) Recent Labs    03/07/19 2101  BNP 1,072.0*    ProBNP (last 3 results) No results for input(s): PROBNP in the last 8760 hours.   D-Dimer No results for input(s): DDIMER in the last 72 hours. Hemoglobin A1C No results for input(s): HGBA1C in the last 72 hours. Fasting Lipid Panel No results for input(s): CHOL, HDL, LDLCALC, TRIG, CHOLHDL, LDLDIRECT in the last 72 hours. Thyroid Function Tests No results for input(s): TSH, T4TOTAL,  T3FREE, THYROIDAB in the last 72 hours.  Invalid input(s): FREET3  Other results:   Imaging    No results found.   Medications:     Scheduled Medications: . calcitRIOL  0.5 mcg Oral Daily  . Chlorhexidine Gluconate Cloth  6 each Topical Q0600  . Chlorhexidine Gluconate Cloth  6 each Topical Q0600  . cholecalciferol  1,000 Units Oral Daily  . insulin aspart  0-9 Units Subcutaneous TID WC  . lidocaine  10 mL Intradermal Once  . metolazone  2.5 mg Oral BID  . midodrine  10 mg Oral TID WC  . sodium chloride flush  3 mL Intravenous Q12H  . sodium chloride flush  3 mL Intravenous Q12H  . vitamin B-12  100 mcg Oral Daily     Infusions: . sodium chloride 10 mL/hr at 03/14/19 1615  . DOBUTamine 1 mcg/kg/min (03/15/19 1200)  . ferric gluconate (FERRLECIT/NULECIT) IV Stopped (03/15/19 1659)  . furosemide 160 mg (03/15/19 2124)  . heparin 1,500 Units/hr (03/16/19 0347)    PRN Medications: sodium chloride, acetaminophen **OR** acetaminophen, fentaNYL (SUBLIMAZE) injection, heparin, lidocaine, ondansetron **OR** ondansetron (ZOFRAN) IV, sodium chloride, sodium chloride flush, traMADol    Assessment/Plan   1. Acute on chronic systolic CHF: Longstanding NICM.  Last echo in 2/19 with EF 15-20%. She has a [employer] CRT-D device. Admitted with progressive dyspnea and 30 lb weight gain. Empirically started on dobutamine 2.5 due to concern for low output. RHC showed R>L heart failure with low PAPI adequate cardiac output on dobutamine.  She had HD on 1/15 and 1/16, 1/18 tolerated well.  .Remains volume overloaded. - Stop doubutamine.   - Suspect she will be HD dependent. - Midodrine 10 mg tid with relative hypotension.  - Stopped digoxin and benazepril with AKI.  - Stopped beta blocker with concern for low output.  - No BP room for hydralazine/nitrates.  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  2. AKI on CKD stage 4: Baseline creatinine around 3, went up to 5 with BUN 150 this admission. Suspect cardiorenal syndrome superimposed on possible diabetic nephropathy.  - Suspect she will be  HD dependent.  - Tolerating HD. Nephrology appreciated.  3. Chronic atrial fibrillation: Warfarin held for procedures, INR 1.7 .   Cover with heparin gtt for now, restart warfarin when we know she will have no more procedures.  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  5. DM: Per primary.  6. Monoclonal gammopathy:  LV morphology does not look concerning for AL amyloidosis. She has been seen by hematology, has had bone marrow bx and awaiting results.  Per Dr Lindi Adie  ---> Preliminary results of the bone marrow biopsy suggest 14% plasma cells by flow cytometry on the aspirate which is by definition multiple myeloma. Requested Dr. Lorenso Courier our hematologist myeloma expert to consult.  7. Tricuspid regurgitation: Severe by echo.   Length of Stay: 8  Amy Clegg, NP  03/16/2019, 7:10 AM  Advanced Heart Failure Team Pager [phone] (M-F; [place])  Please contact [facility] for night-coverage after hours (4p -7a ) and weekends on [email or web address]  Patient seen with NP, agree with the above.   Main complaint today is burning pain in lower abdomen upper legs (has small subcutaneous nodules).  Tolerating HD so far, had HD yesterday.  Weight is down.   General: NAD Neck: JVP 14 cm with prominent CV waves, no thyromegaly or thyroid nodule.  Lungs: Clear to auscultation bilaterally with  normal respiratory effort. CV: Lateral PMI.  Heart regular S1/S2, no S3/S4, 3/6 HSM LLSB.  1+ ankle edema.   Abdomen: Soft, nontender, no hepatosplenomegaly, no distention.  Skin: Tender subcutaneous nodules lower abdomen and thighs.  Neurologic: Alert and oriented x 3.  Psych: Normal affect. Extremities: No clubbing or cyanosis.  HEENT: Normal.   Patient appears to be HD dependent at this point. She had HD yesterday, and will get again tomorrow.  Continue high dose Lasix/metolazone today as she will not get HD today.  Will need planning for long-term HD.   Can stop dobutamine today. Continue midodrine.   Diagnosed by BM biopsy with multiple myeloma, heme/onc arranging treatment.   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  Discuss with renal.   Needs to get up/ambulate.   Loralie Champagne 03/16/2019 8:28 AM

## 2019-03-17 ENCOUNTER — Inpatient Hospital Stay (HOSPITAL_COMMUNITY): Payer: Medicaid Other

## 2019-03-17 DIAGNOSIS — I5022 Chronic systolic (congestive) heart failure: Secondary | ICD-10-CM

## 2019-03-17 DIAGNOSIS — R1084 Generalized abdominal pain: Secondary | ICD-10-CM

## 2019-03-17 LAB — RENAL FUNCTION PANEL
Albumin: 2.5 g/dL — ABNORMAL LOW (ref 3.5–5.0)
Anion gap: 14 (ref 5–15)
BUN: 29 mg/dL — ABNORMAL HIGH (ref 6–20)
CO2: 21 mmol/L — ABNORMAL LOW (ref 22–32)
Calcium: 7.9 mg/dL — ABNORMAL LOW (ref 8.9–10.3)
Chloride: 95 mmol/L — ABNORMAL LOW (ref 98–111)
Creatinine, Ser: 2.69 mg/dL — ABNORMAL HIGH (ref 0.44–1.00)
GFR calc Af Amer: 22 mL/min — ABNORMAL LOW (ref >60)
GFR calc non Af Amer: 19 mL/min — ABNORMAL LOW (ref >60)
Glucose, Bld: 210 mg/dL — ABNORMAL HIGH (ref 70–99)
Phosphorus: 4.6 mg/dL (ref 2.5–4.6)
Potassium: 4.1 mmol/L (ref 3.5–5.1)
Sodium: 130 mmol/L — ABNORMAL LOW (ref 135–145)

## 2019-03-17 LAB — CBC
HCT: 26.7 % — ABNORMAL LOW (ref 36.0–46.0)
Hemoglobin: 9.1 g/dL — ABNORMAL LOW (ref 12.0–15.0)
MCH: 27.7 pg (ref 26.0–34.0)
MCHC: 34.1 g/dL (ref 30.0–36.0)
MCV: 81.2 fL (ref 80.0–100.0)
Platelets: 196 10*3/uL (ref 150–400)
RBC: 3.29 MIL/uL — ABNORMAL LOW (ref 3.87–5.11)
RDW: 16.5 % — ABNORMAL HIGH (ref 11.5–15.5)
WBC: 15.2 10*3/uL — ABNORMAL HIGH (ref 4.0–10.5)
nRBC: 0 % (ref 0.0–0.2)

## 2019-03-17 LAB — PROTIME-INR
INR: 1.8 — ABNORMAL HIGH (ref 0.8–1.2)
Prothrombin Time: 20.5 seconds — ABNORMAL HIGH (ref 11.4–15.2)

## 2019-03-17 LAB — HEPARIN LEVEL (UNFRACTIONATED): Heparin Unfractionated: 0.32 IU/mL (ref 0.30–0.70)

## 2019-03-17 LAB — GLUCOSE, CAPILLARY
Glucose-Capillary: 178 mg/dL — ABNORMAL HIGH (ref 70–99)
Glucose-Capillary: 216 mg/dL — ABNORMAL HIGH (ref 70–99)
Glucose-Capillary: 127 mg/dL — ABNORMAL HIGH (ref 70–99)
Glucose-Capillary: 168 mg/dL — ABNORMAL HIGH (ref 70–99)

## 2019-03-17 MED ORDER — HEPARIN SODIUM (PORCINE) 1000 UNIT/ML IJ SOLN
INTRAMUSCULAR | Status: AC
  Administered 2019-03-17: 2600 [IU] via INTRAVENOUS
  Filled 2019-03-17: qty 3

## 2019-03-17 MED ORDER — TRAMADOL HCL 50 MG PO TABS
ORAL_TABLET | ORAL | Status: AC
  Filled 2019-03-17: qty 1

## 2019-03-17 MED ORDER — OXYCODONE HCL 5 MG PO TABS
5.0000 mg | ORAL_TABLET | Freq: Four times a day (QID) | ORAL | Status: DC | PRN
  Administered 2019-03-17 – 2019-03-20 (×6): 5 mg via ORAL
  Filled 2019-03-17 (×6): qty 1

## 2019-03-17 NOTE — Progress Notes (Signed)
PT Cancellation Note  Patient Details Name: [name] MRN: [id number] DOB: 1960/08/29   Cancelled Treatment:    Reason Eval/Treat Not Completed: Medical issues which prohibited therapy. Per discussion with OT, pt recently desaturating when on [facility] requiring transfer back to bed and placement on NRB. Pt not tolerating mobility well. PT will hold at this time until pt respiratory status becomes more stable.   Zenaida Niece 03/17/2019, 12:10 PM

## 2019-03-17 NOTE — Progress Notes (Signed)
Subjective:    Additional HD yesterday, 3.5 L ultrafiltration  Remains on high flow nasal cannula  Legs and abdomen continued to be painful  Labs are stable, K4.1, HCO3 21  Edema improved but still hypervolemic considerably  She wishes to continue her nephrology care with our group, does not wish to return to [facility] kidney   Objective Vital signs in last 24 hours: Vitals:   03/17/19 0300 03/17/19 0327 03/17/19 0531 03/17/19 0726  BP:  99/61  (!) 106/54  Pulse: (!) 59 60  (!) 59  Resp: (!) 27 (!) 25  (!) 23  Temp:  (!) 97.4 F (36.3 C)  97.9 F (36.6 C)  TempSrc:  Oral  Oral  SpO2:  99%  100%  Weight:   84 kg   Height:       Weight change: 1.8 kg  Intake/Output Summary (Last 24 hours) at 03/17/2019 1145 Last data filed at 03/17/2019 0751 Gross per 24 hour  Intake 1641.66 ml  Output 3800 ml  Net -2158.34 ml    Assessment/ Plan: Pt is a 59 y.o. yo female with nonischemic cardiomyopathy/MGUS/DM and baseline CKD (crt 3's)  who was admitted on 03/07/2019 with massive volume overload and crt 5  Assessment/Plan: 1. Renal-  A on CRF.  Had advanced CKD at baseline. Suspect cardiorenal vs DM bu also has M spike.  Per cards-  Heart does not look like amyloid.  Onc has seen, s/p bone marrow biopsy-multiple myeloma.   Now appears to be dialysis dependent since 1/15, I do not anticipate that she will recover and believe she will be ESRD.  Remains significantly hypervolemic, aggressive UF as hemodynamics permit.  Currently on MWF schedule, CLIP in process. Not ready for permanent vascular access. Continue dialysis on schedule today, continued aggressive ultrafiltration, blood pressure permitting 2. Vol/htn-improved but remains very volume overloaded and relatively hypotensive-  As above. On midodrine. Really not making any urine on high-dose diuretics, will stop today. 3. Anemia-multifactorial including #1, myeloma, hematology following.  ESA dosing at their discretion  4. 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  She is on calcitriol which we will stop.  We will also stop cholecalciferol.  Calciphylaxis would be unusual in the circumstances but certainly on the differential. General surgery has been consulted for biopsy. If confirms calciphylaxis would also offer her thiosulfate..  I would not resume warfarin moving forward but could receive apixaban.  Pain control per primary. 5. Hyperkalemia-  Resolved    6. Hyponatremia-  Likely hypervolemic hyponatremia-  Improving as volume does  Rexene Agent    Labs: Basic Metabolic Panel: Recent Labs  Lab 03/15/19 0644 03/16/19 0243 03/17/19 0144  NA 130* 131* 130*  K 4.3 3.9 4.1  CL 94* 94* 95*  CO2 22 22 21*  GLUCOSE 127* 125* 210*  BUN 74* 42* 29*  CREATININE 3.39* 2.80* 2.69*  CALCIUM 7.5* 7.8* 7.9*  PHOS 5.9* 4.7* 4.6   Liver Function Tests: Recent Labs  Lab 03/15/19 0644 03/16/19 0243 03/17/19 0144  ALBUMIN 2.3* 2.2* 2.5*   No results for input(s): LIPASE, AMYLASE in the last 168 hours. No results for input(s): AMMONIA in the last 168 hours. CBC: Recent Labs  Lab 03/11/19 0177 03/11/19 9390 03/12/19 3009 03/12/19 2330 03/13/19 0762 03/13/19 0213 03/14/19 0231 03/14/19 0231 03/15/19  0370 03/16/19 0243 03/17/19 0144  WBC 6.8   < > 7.2   < > 8.8   < > 9.2   < > 9.8 12.3* 15.2*  NEUTROABS 5.5  --  5.7  --  7.5  --   --   --   --   --   --   HGB 10.1*   < > 9.5*   < > 9.6*   < > 9.6*   < > 9.6* 9.2* 9.1*  HCT 29.6*   < > 27.6*   < > 27.8*   < > 28.0*   < > 28.5* 27.4* 26.7*  MCV 80.7   < > 80.0   < > 79.7*  --  80.7  --  82.8 81.3 81.2  PLT 139*   < > 141*   < > 144*   < > 128*   < > 150 184 196   < > = values in this interval not displayed.   Cardiac Enzymes: No results for  input(s): CKTOTAL, CKMB, CKMBINDEX, TROPONINI in the last 168 hours. CBG: Recent Labs  Lab 03/16/19 0637 03/16/19 1113 03/16/19 1618 03/16/19 2056 03/17/19 0616  GLUCAP 138* 191* 115* 192* 178*    Iron Studies:  No results for input(s): IRON, TIBC, TRANSFERRIN, FERRITIN in the last 72 hours. Studies/Results: DG CHEST PORT 1 VIEW  Result Date: 03/17/2019 CLINICAL DATA:  CHF EXAM: PORTABLE CHEST 1 VIEW COMPARISON:  03/07/2019 FINDINGS: Cardiac enlargement with AICD. Negative for heart failure. Atherosclerotic aortic arch. Lungs are clear without infiltrate or effusion. IMPRESSION: No active disease. Electronically Signed   By: Franchot Gallo M.D.   On: 03/17/2019 10:58   Medications: Infusions: . sodium chloride 10 mL/hr at 03/14/19 1615  . furosemide 160 mg (03/17/19 1025)  . heparin 1,500 Units/hr (03/16/19 2300)    Scheduled Medications: . Chlorhexidine Gluconate Cloth  6 each Topical Q0600  . Chlorhexidine Gluconate Cloth  6 each Topical Q0600  . insulin aspart  0-9 Units Subcutaneous TID WC  . lidocaine (PF)  5 mL Other Once  . lidocaine  10 mL Intradermal Once  . metolazone  2.5 mg Oral BID  . midodrine  10 mg Oral TID WC  . sodium chloride flush  3 mL Intravenous Q12H  . sodium chloride flush  3 mL Intravenous Q12H  . vitamin B-12  100 mcg Oral Daily    have reviewed scheduled and prn medications.  Physical Exam: General: alert, pleasan Heart: RRR Lungs: dec BS at bases Abdomen: soft, non tender Extremities: pitting edema throughout    03/17/2019,11:45 AM  LOS: 9 days

## 2019-03-17 NOTE — Progress Notes (Signed)
8 Days Post-Op  Subjective: CC: Hard painful areas to upper thighs and abdomen  [name] is a 59 y.o. female who we were asked to see to perform a skin bx to r/o calciphylaxis. She reports the affected areas include her abdomen and upper legs. They are tender to touch and have been bothering her for a few days now. No drainage.   Objective: Vital signs in last 24 hours: Temp:  [97.4 F (36.3 C)-98.4 F (36.9 C)] 97.9 F (36.6 C) (01/20 0726) Pulse Rate:  [59-60] 59 (01/20 0726) Resp:  [13-28] 23 (01/20 0726) BP: (97-110)/(51-64) 106/54 (01/20 0726) SpO2:  [71 %-100 %] 89 % (01/20 1204) Weight:  [84 kg-88.2 kg] 84 kg (01/20 0531) Last BM Date: 03/16/19  Intake/Output from previous day: 01/19 0701 - 01/20 0700 In: 1801.7 [P.O.:1150; I.V.:239.9; IV Piggyback:411.8] Out: 3800 [Urine:300] Intake/Output this shift: Total I/O In: 240 [P.O.:240] Out: -   PE: Gen:  Alert, NAD, pleasant Card:  Regular  Pulm:  CTAB, no W/R/R, effort normal Abd: Soft, NT/ND, +BS. She has firmness of her lower abdomen Ext:  Patient has firmness/induration and some redness to her upper thighs and groin. No fluctuance or drainage  Psych: A&Ox3  Skin: no rashes noted, warm and dry  Lab Results:  Recent Labs    03/16/19 0243 03/17/19 0144  WBC 12.3* 15.2*  HGB 9.2* 9.1*  HCT 27.4* 26.7*  PLT 184 196   BMET Recent Labs    03/16/19 0243 03/17/19 0144  NA 131* 130*  K 3.9 4.1  CL 94* 95*  CO2 22 21*  GLUCOSE 125* 210*  BUN 42* 29*  CREATININE 2.80* 2.69*  CALCIUM 7.8* 7.9*   PT/INR Recent Labs    03/16/19 0243 03/17/19 0144  LABPROT 19.7* 20.5*  INR 1.7* 1.8*   CMP     Component Value Date/Time   NA 130 (L) 03/17/2019 0144   K 4.1 03/17/2019 0144   CL 95 (L) 03/17/2019 0144   CO2 21 (L) 03/17/2019 0144   GLUCOSE 210 (H) 03/17/2019 0144   BUN 29 (H) 03/17/2019 0144   CREATININE 2.69 (H) 03/17/2019 0144   CREATININE 2.47 (H) 08/02/2016 1400   CALCIUM 7.9 (L)  03/17/2019 0144   PROT 7.3 03/08/2019 0927   ALBUMIN 2.5 (L) 03/17/2019 0144   AST 24 03/08/2019 0927   ALT 16 03/08/2019 0927   ALKPHOS 303 (H) 03/08/2019 0927   BILITOT 3.3 (H) 03/08/2019 0927   GFRNONAA 19 (L) 03/17/2019 0144   GFRNONAA 21 (L) 08/02/2016 1400   GFRAA 22 (L) 03/17/2019 0144   GFRAA 24 (L) 08/02/2016 1400   Lipase     Component Value Date/Time   LIPASE 105 (H) 03/03/2017 1038       Studies/Results: DG CHEST PORT 1 VIEW  Result Date: 03/17/2019 CLINICAL DATA:  CHF EXAM: PORTABLE CHEST 1 VIEW COMPARISON:  03/07/2019 FINDINGS: Cardiac enlargement with AICD. Negative for heart failure. Atherosclerotic aortic arch. Lungs are clear without infiltrate or effusion. IMPRESSION: No active disease. Electronically Signed   By: Franchot Gallo M.D.   On: 03/17/2019 10:58    Anti-infectives: Anti-infectives (From admission, onward)   None     PROCEDURE: Skin Punch Biopsy   Performed by Jillyn Ledger, PA-C Consent: Informed consent, after discussion of the risks, benefits, and alternatives to the procedure, was obtained Indication: [name] is a 59 y.o. female who we were asked to see to perform a Skin Punch Biopsy to r/o  calciphylaxis.  Location: Right Upper Thigh  Anesthesia: 1% lidocaine without epi Preparation: The wound was cleaned with chloraprep solution. The area was prepped and draped in the usual sterile fashion.  Procedure: A 12mm punch was performed to the right upper leg over an affected area and placed in a formalin solution.  Post-Procedure: Good hemostasis. The patient tolerated the procedure well and there were no complications. Pressure dressing was applied and can remain in place for 24 hours. Changes as needed for saturation.   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 The patient tolerated the procedure well and there were no complications. Pressure dressing was applied and can  remain in place for 24 hours. Change as needed for saturation. General Surgery will sign off. Thank you for the consult.    LOS: 9 days    Jillyn Ledger , [facility] 03/17/2019, 12:16 PM Please see Amion for pager number during day hours 7:00am-4:30pm

## 2019-03-17 NOTE — Plan of Care (Signed)
  Problem: Education: Goal: Ability to demonstrate management of disease process will improve Outcome: Progressing Goal: Ability to verbalize understanding of medication therapies will improve Outcome: Progressing Goal: Individualized Educational Video(s) Outcome: Progressing   Problem: Activity: Goal: Capacity to carry out activities will improve Outcome: Progressing   Problem: Cardiac: Goal: Ability to achieve and maintain adequate cardiopulmonary perfusion will improve Outcome: Progressing   Problem: Education: Goal: Knowledge of General Education information will improve Description: Including pain rating scale, medication(s)/side effects and non-pharmacologic comfort measures Outcome: Progressing   Problem: Health Behavior/Discharge Planning: Goal: Ability to manage health-related needs will improve Outcome: Progressing   Problem: Clinical Measurements: Goal: Ability to maintain clinical measurements within normal limits will improve Outcome: Progressing Goal: Will remain free from infection Outcome: Progressing Goal: Diagnostic test results will improve Outcome: Progressing Goal: Respiratory complications will improve Outcome: Progressing Goal: Cardiovascular complication will be avoided Outcome: Progressing   Problem: Activity: Goal: Risk for activity intolerance will decrease Outcome: Progressing   Problem: Nutrition: Goal: Adequate nutrition will be maintained Outcome: Progressing   Problem: Coping: Goal: Level of anxiety will decrease Outcome: Progressing   Problem: Elimination: Goal: Will not experience complications related to bowel motility Outcome: Progressing Goal: Will not experience complications related to urinary retention Outcome: Progressing   Problem: Pain Managment: Goal: General experience of comfort will improve Outcome: Progressing   Problem: Safety: Goal: Ability to remain free from injury will improve Outcome: Progressing    Problem: Skin Integrity: Goal: Risk for impaired skin integrity will decrease Outcome: Progressing   Problem: Education: Goal: Understanding of CV disease, CV risk reduction, and recovery process will improve Outcome: Progressing Goal: Individualized Educational Video(s) Outcome: Progressing   Problem: Activity: Goal: Ability to return to baseline activity level will improve Outcome: Progressing   Problem: Cardiovascular: Goal: Ability to achieve and maintain adequate cardiovascular perfusion will improve Outcome: Progressing Goal: Vascular access site(s) Level 0-1 will be maintained Outcome: Progressing   Problem: Health Behavior/Discharge Planning: Goal: Ability to safely manage health-related needs after discharge will improve Outcome: Progressing   

## 2019-03-17 NOTE — Discharge Planning (Signed)
Patient has been referred to [facility] has requested clarification re: ESRD vs AKI. Will update clinic when note is available from Nephrology. Also, please advise re: discharge date. Thanks,

## 2019-03-17 NOTE — Progress Notes (Signed)
Patient ID: [name], female   DOB: [birth year], 60 y.o.   MRN: [id number]     Advanced Heart Failure Rounding Note  PCP-Cardiologist: Kate Sable, MD   Subjective:    Dobutamine stopped yesterday, BP stable.  She is still on HFNC 8L though she had HD yesterday and weight continues to come down.   Still with lower abdomen/upper leg pain.   Echo: EF < 20%, no LVH, mildly reduced RV systolic function, severe biatrial enlargement, severe TR.  RHC Procedural Findings (dobutamine 2.5 mcg/kg/min): Hemodynamics (mmHg) RA 18 RV 43/18 PA 45/19, mean 28 PCWP mean 19 Oxygen saturations: PA 63% AO 94% Cardiac Output (Fick) 6.1  Cardiac Index (Fick) 3.06 PVR 1.5 WU PAPI 1.4   Objective:   Weight Range: 84 kg Body mass index is 29.89 kg/m.   Vital Signs:   Temp:  [97.4 F (36.3 C)-98.6 F (37 C)] 97.9 F (36.6 C) (01/20 0726) Pulse Rate:  [59-61] 59 (01/20 0726) Resp:  [13-28] 23 (01/20 0726) BP: (97-111)/(51-64) 106/54 (01/20 0726) SpO2:  [94 %-100 %] 100 % (01/20 0726) Weight:  [84 kg-91.7 kg] 84 kg (01/20 0531) Last BM Date: 03/16/19  Weight change: Filed Weights   03/16/19 1452 03/17/19 0018 03/17/19 0531  Weight: 88.2 kg 84 kg 84 kg    Intake/Output:   Intake/Output Summary (Last 24 hours) at 03/17/2019 0919 Last data filed at 03/17/2019 0751 Gross per 24 hour  Intake 1781.66 ml  Output 3800 ml  Net -2018.34 ml      Physical Exam   General: NAD Neck: JVP 14-16 cm, no thyromegaly or thyroid nodule.  Lungs: Slight crackles at bases.  CV: Lateral PMI.  Heart regular S1/S2, +S3, 3/6 HSM LLSB. 2+ edema to knees.    Abdomen: Soft, nontender, no hepatosplenomegaly, no distention.  Skin: Intact without lesions or rashes.  Neurologic: Alert and oriented x 3.  Psych: Normal affect. Extremities: No clubbing or cyanosis.  HEENT: Normal.    Telemetry   A Fib Bi V pacing. 60-90s  Personally reviewed.   Labs    CBC Recent Labs    03/16/19 0243  03/17/19 0144  WBC 12.3* 15.2*  HGB 9.2* 9.1*  HCT 27.4* 26.7*  MCV 81.3 81.2  PLT 184 735   Basic Metabolic Panel Recent Labs    03/16/19 0243 03/17/19 0144  NA 131* 130*  K 3.9 4.1  CL 94* 95*  CO2 22 21*  GLUCOSE 125* 210*  BUN 42* 29*  CREATININE 2.80* 2.69*  CALCIUM 7.8* 7.9*  PHOS 4.7* 4.6   Liver Function Tests Recent Labs    03/16/19 0243 03/17/19 0144  ALBUMIN 2.2* 2.5*   No results for input(s): LIPASE, AMYLASE in the last 72 hours. Cardiac Enzymes No results for input(s): CKTOTAL, CKMB, CKMBINDEX, TROPONINI in the last 72 hours.  BNP: BNP (last 3 results) Recent Labs    03/07/19 2101  BNP 1,072.0*    ProBNP (last 3 results) No results for input(s): PROBNP in the last 8760 hours.   D-Dimer No results for input(s): DDIMER in the last 72 hours. Hemoglobin A1C No results for input(s): HGBA1C in the last 72 hours. Fasting Lipid Panel No results for input(s): CHOL, HDL, LDLCALC, TRIG, CHOLHDL, LDLDIRECT in the last 72 hours. Thyroid Function Tests No results for input(s): TSH, T4TOTAL, T3FREE, THYROIDAB in the last 72 hours.  Invalid input(s): FREET3  Other results:   Imaging    No results found.   Medications:     Scheduled  Medications: . Chlorhexidine Gluconate Cloth  6 each Topical Q0600  . Chlorhexidine Gluconate Cloth  6 each Topical Q0600  . insulin aspart  0-9 Units Subcutaneous TID WC  . lidocaine (PF)  5 mL Other Once  . lidocaine  10 mL Intradermal Once  . metolazone  2.5 mg Oral BID  . midodrine  10 mg Oral TID WC  . sodium chloride flush  3 mL Intravenous Q12H  . sodium chloride flush  3 mL Intravenous Q12H  . vitamin B-12  100 mcg Oral Daily    Infusions: . sodium chloride 10 mL/hr at 03/14/19 1615  . ferric gluconate (FERRLECIT/NULECIT) IV 110 mL/hr at 03/16/19 1122  . furosemide Stopped (03/16/19 2235)  . heparin 1,500 Units/hr (03/16/19 2300)    PRN Medications: sodium chloride, acetaminophen **OR**  acetaminophen, fentaNYL (SUBLIMAZE) injection, heparin, lidocaine, ondansetron **OR** ondansetron (ZOFRAN) IV, sodium chloride, sodium chloride flush, traMADol    Assessment/Plan   1. Acute on chronic systolic CHF: Longstanding NICM.  Last echo in 2/19 with EF 15-20%. She has a [employer] CRT-D device. Admitted with progressive dyspnea and 30 lb weight gain. Empirically started on dobutamine 2.5 due to concern for low output. RHC showed R>L heart failure with low PAPI adequate cardiac output on dobutamine.  She has started iHD, tolerates well.  Now off dobutamine.  Still volume overloaded with significant oxygen requirement. Minimal response to Lasix/metolazone.  - Continue daily HD for volume removal, plan today. Will follow CVP.  - Will get CXR today.  - Continue midodrine 10 mg tid with relative hypotension.  - Lasix and metolazone per renal but with minimal response think we can stop.  - Stopped digoxin and benazepril with AKI.  - Stopped beta blocker with concern for low output.  - No BP room for hydralazine/nitrates.  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  2. AKI on CKD stage 4: Baseline creatinine around 3, went up to 5 with BUN 150 this admission. Suspect cardiorenal syndrome superimposed on possible diabetic nephropathy. Suspect she will be HD dependent.  - Tolerating HD. Nephrology appreciated.  3. Chronic atrial fibrillation: Warfarin held for procedures, now on heparin gtt. Long-term, will transition to apixaban with some concern for warfarin skin necrosis.    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  5. DM: Per primary.  6. Monoclonal gammopathy:  LV morphology does not look concerning for AL amyloidosis. She has been seen by hematology, has had bone marrow bx showing 14% plasma cells by flow cytometry on the aspirate which is by definition multiple myeloma. Seen by heme/onc, plan for outpatient treatment.  7.  Tricuspid regurgitation: Severe by echo.  9. Subcutaneous nodules: ?calciphylaxis versus less likely warfarin skin necrosis.  - Off warfarin, will use Eliquis in future.  - Surgery consulted for biopsy.  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   Length of Stay: 9  Loralie Champagne, MD  03/17/2019, 9:19 AM  Advanced Heart Failure Team Pager [phone] (M-F; 7a - 4p)  Please contact [facility] for night-coverage after hours (4p -7a ) and weekends on [email or web address]

## 2019-03-17 NOTE — Evaluation (Addendum)
Occupational Therapy Evaluation Patient Details Name: [name] MRN: [id number] DOB: 1960-03-10 Today's Date: 03/17/2019    History of Present Illness 59 year old with PMH significant for MGUS, thrombocytopenia, A. fib on Coumadin, CHB with pacemaker, nonischemic cardiomyopathy, CKD stage IV baseline creatinine 3.0, cirrhosis who presented with several weeks of progressive swelling about 30 pound weight gain.    Clinical Impression   Pt was in room with NT, on BSC with SpO2 in the 60's on 10Lvia HFNC. Increased O2 to 15L HFNC and SpO2 went up to 80%. Pt required verbal and tactile cues with mod physical assist to stand pivot from [facility] back to EOB. Pt then mod A to return supine. Pt placed on NRB, educated/instructed in PLB - return of SpO2 to high 80's after 8 min supine (HOB elevated). Pt's husband present throughout session. Pt overall presents with generalized weakness, decreased activity tolerance, and decreased cognition. Pt will require post-acute rehab to ensure maximum independence in ADL and transfers. At this time recommending CIR level therapy - and OT will continue to monitor for appropriateness.     Follow Up Recommendations  CIR;Supervision/Assistance - 24 hour    Equipment Recommendations  3 in 1 bedside commode    Recommendations for Other Services PT consult;Rehab consult; Palliative Medicine Consult     Precautions / Restrictions Precautions Precautions: Fall Precaution Comments: watch O2 Restrictions Weight Bearing Restrictions: No      Mobility Bed Mobility Overal bed mobility: Needs Assistance Bed Mobility: Sit to Supine       Sit to supine: Mod assist;+2 for safety/equipment   General bed mobility comments: mod A for legs back to supine, use of bed rail to assist  Transfers Overall transfer level: Needs assistance Equipment used: 1 person hand held assist Transfers: Stand Pivot Transfers   Stand pivot transfers: Mod assist       General  transfer comment: verbal and tactile cues for safety and sequencing throughout transfer    Balance Overall balance assessment: Needs assistance Sitting-balance support: Single extremity supported;Feet supported Sitting balance-Leahy Scale: Poor     Standing balance support: Bilateral upper extremity supported Standing balance-Leahy Scale: Poor Standing balance comment: dependent on external support                           ADL either performed or assessed with clinical judgement   ADL Overall ADL's : Needs assistance/impaired Eating/Feeding: Minimal assistance   Grooming: Minimal assistance   Upper Body Bathing: Moderate assistance   Lower Body Bathing: Moderate assistance   Upper Body Dressing : Minimal assistance   Lower Body Dressing: Moderate assistance   Toilet Transfer: Moderate assistance;Stand-pivot;BSC Toilet Transfer Details (indicate cue type and reason): drastic drop in SpO2 Toileting- Clothing Manipulation and Hygiene: Maximal assistance;Sit to/from stand       Functional mobility during ADLs: Moderate assistance(SPT face to face transfer) General ADL Comments: decreased activity tolerance, cognition, respiratory status     Vision         Perception     Praxis      Pertinent Vitals/Pain Pain Assessment: Faces Faces Pain Scale: Hurts little more Pain Location: BLE, lower back Pain Descriptors / Indicators: Aching;Discomfort;Grimacing Pain Intervention(s): Limited activity within patient's tolerance;Monitored during session;Repositioned;Other (comment)(elevation of BLE in supine)     Hand Dominance Right   Extremity/Trunk Assessment Upper Extremity Assessment Upper Extremity Assessment: Generalized weakness   Lower Extremity Assessment Lower Extremity Assessment: Generalized weakness   Cervical / Trunk Assessment  Cervical / Trunk Assessment: Normal   Communication Communication Communication: No difficulties;Other  (comment)(SOB, limited due to NRB)   Cognition Arousal/Alertness: Awake/alert Behavior During Therapy: Flat affect Overall Cognitive Status: Impaired/Different from baseline Area of Impairment: Attention;Following commands;Safety/judgement;Awareness                   Current Attention Level: Sustained   Following Commands: Follows one step commands with increased time Safety/Judgement: Decreased awareness of safety Awareness: Emergent   General Comments: delayed processing, unaware of situation (SpO2), required verbal and tactile cues   General Comments       Exercises     Shoulder Instructions      Home Living Family/patient expects to be discharged to:: Private residence Living Arrangements: Spouse/significant other Available Help at Discharge: Family;Available 24 hours/day Type of Home: House                                  Prior Functioning/Environment                   OT Problem List: Decreased activity tolerance;Impaired balance (sitting and/or standing);Decreased cognition;Decreased safety awareness;Decreased knowledge of use of DME or AE;Cardiopulmonary status limiting activity;Increased edema      OT Treatment/Interventions: Self-care/ADL training;Energy conservation;DME and/or AE instruction;Therapeutic activities;Patient/family education;Balance training    OT Goals(Current goals can be found in the care plan section) Acute Rehab OT Goals Patient Stated Goal: get better OT Goal Formulation: With patient/family Time For Goal Achievement: 03/31/19 Potential to Achieve Goals: Good ADL Goals Pt Will Perform Grooming: with modified independence;sitting Pt Will Perform Upper Body Bathing: with modified independence;sitting Pt Will Perform Lower Body Bathing: with set-up;with adaptive equipment;sitting/lateral leans Pt Will Perform Upper Body Dressing: with set-up;with caregiver independent in assisting;sitting Pt Will Perform Lower  Body Dressing: with set-up;with caregiver independent in assisting;sit to/from stand Pt Will Transfer to Toilet: with supervision;ambulating Pt Will Perform Toileting - Clothing Manipulation and hygiene: with supervision;sit to/from stand  OT Frequency: Min 2X/week   Barriers to D/C:            Co-evaluation              AM-PAC OT "6 Clicks" Daily Activity     Outcome Measure Help from another person eating meals?: A Little Help from another person taking care of personal grooming?: A Little Help from another person toileting, which includes using toliet, bedpan, or urinal?: A Lot Help from another person bathing (including washing, rinsing, drying)?: A Lot Help from another person to put on and taking off regular upper body clothing?: A Little Help from another person to put on and taking off regular lower body clothing?: A Lot 6 Click Score: 15   End of Session Equipment Utilized During Treatment: Oxygen(15L via NRB) Nurse Communication: Mobility status  Activity Tolerance: Treatment limited secondary to medical complications (Comment)(SpO2) Patient left: in bed;with call bell/phone within reach;with nursing/sitter in room;with family/visitor present  OT Visit Diagnosis: Unsteadiness on feet (R26.81);Other abnormalities of gait and mobility (R26.89);Muscle weakness (generalized) (M62.81);Other symptoms and signs involving cognitive function                Time: 8338-2505 OT Time Calculation (min): 21 min Charges:  OT General Charges $OT Visit: 1 Visit OT Evaluation $OT Eval Moderate Complexity: [place] OTR/L Acute Rehabilitation Services Pager: [phone] Office: [phone]  Mickel Baas  03/17/2019, 12:46 PM

## 2019-03-17 NOTE — Progress Notes (Signed)
Rehab Admissions Coordinator Note:  Per OT recommendation, this patient was screened by Raechel Ache for appropriateness for an Inpatient Acute Rehab Consult.  At this time, we are recommending Inpatient Rehab consult.   AC will place consult order in the chart to allow for further follow up and assessment of pt's candidacy.   Raechel Ache 03/17/2019, 1:35 PM  I can be reached at [phone].

## 2019-03-17 NOTE — Progress Notes (Signed)
Pt up to [facility] on 9LPM O2, RN called in by NT for desat to 70%. Placed on NRB and got pt back to bed with OT. Pt slow to recover, minimal reserve. No change in LOC. Spouse at bedside. Will continue to monitor.   Physician notified: Lama At: 79  Regarding: FYI pt on NRB mask after up to [facility], slow to recover. SpO2 88%. Going for HD afternoon session today.

## 2019-03-17 NOTE — Progress Notes (Signed)
ANTICOAGULATION CONSULT NOTE - [place] for heparin Indication: atrial fibrillation  Allergies  Allergen Reactions  . Wheat Swelling  . Latex Rash  . Penicillins Rash  . Sulfa Antibiotics Rash    Patient Measurements: Height: 5\' 6"  (167.6 cm) Weight: 185 lb 3 oz (84 kg) IBW/kg (Calculated) : 59.3  Vital Signs: Temp: 97.9 F (36.6 C) (01/20 0726) Temp Source: Oral (01/20 0726) BP: 106/54 (01/20 0726) Pulse Rate: 59 (01/20 0726)  Labs: Recent Labs    03/15/19 0644 03/15/19 0644 03/15/19 1335 03/16/19 0243 03/17/19 0144  HGB 9.6*   < >  --  9.2* 9.1*  HCT 28.5*  --   --  27.4* 26.7*  PLT 150  --   --  184 196  LABPROT 19.4*  --   --  19.7* 20.5*  INR 1.7*  --   --  1.7* 1.8*  HEPARINUNFRC 0.31   < > 0.35 0.33 0.32  CREATININE 3.39*  --   --  2.80* 2.69*   < > = values in this interval not displayed.    Estimated Creatinine Clearance: 24.6 mL/min (A) (by C-G formula based on SCr of 2.69 mg/dL (H)).   Assessment: 59 yo female with hx AFib, Coumadin on hold, continue heparin bridge.   INR 1.8 today. CBC stable. Heparin level within goal range, no overt bleeding or complications noted.   Goal of Therapy:  Heparin level goal 0.3-0.7 INR 2-3 Monitor platelets by anticoagulation protocol: Yes   Plan:  Continue IV heparin at current rate. Daily heparin level and CBC. Per nephrology note, continue to hold Coumadin.  Marguerite Olea, [facility] Clinical Pharmacist Phone [phone]  03/17/2019 8:01 AM

## 2019-03-17 NOTE — Progress Notes (Signed)
[facility]  PROGRESS NOTE  [name] VEL:[id number] DOB: Feb 07, 1961 DOA: 03/07/2019 PCP: [name], MD   Brief HPI:   59 year old female with a history of MGUS, thrombocytopenia, atrial fibrillation on Coumadin, complete heart block with pacemaker, nonischemic cardiomyopathy with EF 15 to 20% with ICD placement, CKD stage IV at baseline creatinine 3.0, liver cirrhosis presented with several weeks of progressive swelling about 30 pound weight gain.  In the ED she was found to have creatinine more than 5, anasarca with hemoglobin 11.  Chest x-ray was clear but she required 2 L of supplemental oxygen.  Patient received Lasix 40 g IV in the ED without significant urine output so she was transferred to [facility] for evaluation by heart failure team.  Patient was started on inotrope, did not respond to inotropic and IV Lasix.  She was started on hemodialysis.  She underwent evaluation for multiple myeloma due to abnormal SPEP/UPEP.  Underwent bone marrow biopsy, preliminary report showed 14% plasma cells.  Oncology is following, and considering to start chemotherapy as outpatient.    Subjective   Patient seen and examined, denies any complaints.  She is currently on 8 L HFNC.   Assessment/Plan:     1. Acute hypoxic respiratory failure-secondary to acute on chronic systolic heart failure-patient's ejection fraction was 1520% in 2019.  She was started on dobutamine drip which was discontinued as patient did not improve.  Also started on high-dose Lasix 160 mg 3 times daily, since patient was not responding to inotropic treatment she underwent hemodialysis on 03/12/2019, 03/13/2019 and 03/16/2019.  She is on midodrine.  2. Acute on chronic kidney disease stage IV-patient's prior creatinine 2.9-3.5 last year.  Nephrology was consulted for CRRT.  She underwent hemodialysis starting from 03/12/2019.  3. Anemia/MGUS with thrombocytopenia-patient is followed by oncology as outpatient.   Oncology was consulted as recent labs showed elevated serum IgG, urine M spike and elevated light chains.  She underwent repeat protein electrophoresis on 03/09/2019 which showed M spike, with elevated free kappa 2004.2 and lambda 46.6 light chains.  Patient underwent bone marrow biopsy which showed 14% plasma cells, findings consistent with plasma cell myeloma.  Dr. Lorenso Courier evaluated the patient, and plan to start chemotherapy as outpatient for multiple myeloma.  4. Chronic atrial fibrillation-Coumadin was held in anticipation for procedure.  Currently on heparin gtt.  5. Diabetes mellitus-continue sliding scale insulin with NovoLog.  6. Palpable mass in lower abdominal wall-CT abdomen was negative for hematoma.  7. Right thigh swelling-CT right femur showed no acute osseous abnormality or soft tissue hematoma.  10 nonloculated fluid adjacent to the deep fascial layer/iliotibial tract overlying the greater trochanter which could represent a posttraumatic seroma/mild Sherry Ruffing lesion.  Diffuse subcutaneous edema and skin thickening surrounding the right lower extremity.     SpO2: 99 % O2 Flow Rate (L/min): 5 L/min    Lab Results  Component Value Date   SARSCOV2NAA NEGATIVE 03/07/2019     CBG: Recent Labs  Lab 03/16/19 1113 03/16/19 1618 03/16/19 2056 03/17/19 0616 03/17/19 1145  GLUCAP 191* 115* 192* 178* 216*    CBC: Recent Labs  Lab 03/11/19 0628 03/11/19 2585 03/12/19 0208 03/12/19 0208 03/13/19 0213 03/14/19 0231 03/15/19 0644 03/16/19 0243 03/17/19 0144  WBC 6.8   < > 7.2   < > 8.8 9.2 9.8 12.3* 15.2*  NEUTROABS 5.5  --  5.7  --  7.5  --   --   --   --   HGB 10.1*   < >  9.5*   < > 9.6* 9.6* 9.6* 9.2* 9.1*  HCT 29.6*   < > 27.6*   < > 27.8* 28.0* 28.5* 27.4* 26.7*  MCV 80.7   < > 80.0   < > 79.7* 80.7 82.8 81.3 81.2  PLT 139*   < > 141*   < > 144* 128* 150 184 196   < > = values in this interval not displayed.    Basic Metabolic Panel: Recent Labs  Lab  03/13/19 0213 03/14/19 0231 03/15/19 0644 03/16/19 0243 03/17/19 0144  NA 130* 131* 130* 131* 130*  K 3.8 3.9 4.3 3.9 4.1  CL 93* 95* 94* 94* 95*  CO2 21* 24 22 22  21*  GLUCOSE 209* 173* 127* 125* 210*  BUN 106* 65* 74* 42* 29*  CREATININE 3.95* 3.18* 3.39* 2.80* 2.69*  CALCIUM 7.0* 7.4* 7.5* 7.8* 7.9*  PHOS 6.3* 4.8* 5.9* 4.7* 4.6     Liver Function Tests: Recent Labs  Lab 03/13/19 0213 03/14/19 0231 03/15/19 0644 03/16/19 0243 03/17/19 0144  ALBUMIN 2.3* 2.4* 2.3* 2.2* 2.5*        DVT prophylaxis: Coumadin  Code Status: Full code  Family Communication: No family at bedside  Disposition Plan: likely home when medically ready for discharge     Scheduled medications:  . Chlorhexidine Gluconate Cloth  6 each Topical Q0600  . Chlorhexidine Gluconate Cloth  6 each Topical Q0600  . insulin aspart  0-9 Units Subcutaneous TID WC  . lidocaine (PF)  5 mL Other Once  . lidocaine  10 mL Intradermal Once  . midodrine  10 mg Oral TID WC  . sodium chloride flush  3 mL Intravenous Q12H  . sodium chloride flush  3 mL Intravenous Q12H  . vitamin B-12  100 mcg Oral Daily    Consultants:  Cardiology  Nephrology  Procedures:  Echocardiogram  Antibiotics:   Anti-infectives (From admission, onward)   None       Objective   Vitals:   03/17/19 1230 03/17/19 1244 03/17/19 1300 03/17/19 1330  BP: (!) 93/53 (!) 95/56 (!) 94/55 (!) 93/54  Pulse: 60 60 60 60  Resp: (!) 24     Temp: (!) 97.5 F (36.4 C)     TempSrc: Axillary     SpO2: 91% 96% 98% 99%  Weight: 85 kg     Height:        Intake/Output Summary (Last 24 hours) at 03/17/2019 1343 Last data filed at 03/17/2019 1300 Gross per 24 hour  Intake 1878.39 ml  Output 3800 ml  Net -1921.61 ml    01/18 1901 - 01/20 0700 In: 2499.9 [P.O.:1150; I.V.:670.4] Out: 3800 [Urine:300]  Filed Weights   03/17/19 0018 03/17/19 0531 03/17/19 1230  Weight: 84 kg 84 kg 85 kg    Physical  Examination:    General: Appears in no acute distress  Cardiovascular: S1-S2, regular  Respiratory: Faint crackles at lung bases  Abdomen: Abdomen is soft, nontender, no organomegaly  Extremities: Bilateral 2+ pitting edema of the lower extremities  Neurologic: Alert, oriented x3, intact insight and judgment, no focal deficit noted    Data Reviewed:   Recent Results (from the past 240 hour(s))  Respiratory Panel by RT PCR (Flu A&B, Covid) - Nasopharyngeal Swab     Status: None   Collection Time: 03/07/19  9:21 PM   Specimen: Nasopharyngeal Swab  Result Value Ref Range Status   SARS Coronavirus 2 by RT PCR NEGATIVE NEGATIVE Final    Comment: (NOTE) SARS-CoV-2  target nucleic acids are NOT DETECTED. The SARS-CoV-2 RNA is generally detectable in upper respiratoy specimens during the acute phase of infection. The lowest concentration of SARS-CoV-2 viral copies this assay can detect is 131 copies/mL. A negative result does not preclude SARS-Cov-2 infection and should not be used as the sole basis for treatment or other patient management decisions. A negative result may occur with  improper specimen collection/handling, submission of specimen other than nasopharyngeal swab, presence of viral mutation(s) within the areas targeted by this assay, and inadequate number of viral copies (<131 copies/mL). A negative result must be combined with clinical observations, patient history, and epidemiological information. The expected result is Negative. Fact Sheet for Patients:  PinkCheek.be Fact Sheet for Healthcare Providers:  GravelBags.it This test is not yet ap proved or cleared by the [place] FDA and  has been authorized for detection and/or diagnosis of SARS-CoV-2 by FDA under an Emergency Use Authorization (EUA). This EUA will remain  in effect (meaning this test can be used) for the duration of the COVID-19  declaration under Section 564(b)(1) of the Act, 21 U.S.C. section 360bbb-3(b)(1), unless the authorization is terminated or revoked sooner.    Influenza A by PCR NEGATIVE NEGATIVE Final   Influenza B by PCR NEGATIVE NEGATIVE Final    Comment: (NOTE) The Xpert Xpress SARS-CoV-2/FLU/RSV assay is intended as an aid in  the diagnosis of influenza from Nasopharyngeal swab specimens and  should not be used as a sole basis for treatment. Nasal washings and  aspirates are unacceptable for Xpert Xpress SARS-CoV-2/FLU/RSV  testing. Fact Sheet for Patients: PinkCheek.be Fact Sheet for Healthcare Providers: GravelBags.it This test is not yet approved or cleared by the [place] FDA and  has been authorized for detection and/or diagnosis of SARS-CoV-2 by  FDA under an Emergency Use Authorization (EUA). This EUA will remain  in effect (meaning this test can be used) for the duration of the  Covid-19 declaration under Section 564(b)(1) of the Act, 21  U.S.C. section 360bbb-3(b)(1), unless the authorization is  terminated or revoked. Performed at [facility], [address], [place], [place] [address]   MRSA PCR Screening     Status: None   Collection Time: 03/08/19 10:26 PM   Specimen: Nasopharyngeal  Result Value Ref Range Status   MRSA by PCR NEGATIVE NEGATIVE Final    Comment:        The GeneXpert MRSA Assay (FDA approved for NASAL specimens only), is one component of a comprehensive MRSA colonization surveillance program. It is not intended to diagnose MRSA infection nor to guide or monitor treatment for MRSA infections. Performed at [facility], [place] [address] [place], [place] [address]       BNP (last 3 results) Recent Labs    03/07/19 2101  BNP 1,072.0*    Studies:  DG CHEST PORT 1 VIEW  Result Date: 03/17/2019 CLINICAL DATA:  CHF EXAM: PORTABLE CHEST 1 VIEW COMPARISON:  03/07/2019 FINDINGS: Cardiac  enlargement with AICD. Negative for heart failure. Atherosclerotic aortic arch. Lungs are clear without infiltrate or effusion. IMPRESSION: No active disease. Electronically Signed   By: Franchot Gallo M.D.   On: 03/17/2019 10:58     Admission status: Inpatient: Based on patients clinical presentation and evaluation of above clinical data, I have made determination that patient meets Inpatient criteria at this time.   Oswald Hillock   [facility] If 7PM-7AM, please contact night-coverage at [email or web address], Office  [phone]  password [facility]  03/17/2019, 1:43  PM  LOS: 9 days

## 2019-03-18 ENCOUNTER — Encounter (HOSPITAL_COMMUNITY): Payer: Self-pay | Admitting: Hematology

## 2019-03-18 ENCOUNTER — Inpatient Hospital Stay (HOSPITAL_COMMUNITY): Payer: Medicaid Other

## 2019-03-18 DIAGNOSIS — Z7901 Long term (current) use of anticoagulants: Secondary | ICD-10-CM

## 2019-03-18 DIAGNOSIS — I4891 Unspecified atrial fibrillation: Secondary | ICD-10-CM

## 2019-03-18 DIAGNOSIS — J9601 Acute respiratory failure with hypoxia: Secondary | ICD-10-CM

## 2019-03-18 LAB — RENAL FUNCTION PANEL
Albumin: 2.5 g/dL — ABNORMAL LOW (ref 3.5–5.0)
Albumin: 2.7 g/dL — ABNORMAL LOW (ref 3.5–5.0)
Anion gap: 14 (ref 5–15)
Anion gap: 16 — ABNORMAL HIGH (ref 5–15)
BUN: 26 mg/dL — ABNORMAL HIGH (ref 6–20)
BUN: 30 mg/dL — ABNORMAL HIGH (ref 6–20)
CO2: 23 mmol/L (ref 22–32)
CO2: 20 mmol/L — ABNORMAL LOW (ref 22–32)
Calcium: 8.4 mg/dL — ABNORMAL LOW (ref 8.9–10.3)
Calcium: 8.5 mg/dL — ABNORMAL LOW (ref 8.9–10.3)
Chloride: 94 mmol/L — ABNORMAL LOW (ref 98–111)
Chloride: 94 mmol/L — ABNORMAL LOW (ref 98–111)
Creatinine, Ser: 2.72 mg/dL — ABNORMAL HIGH (ref 0.44–1.00)
Creatinine, Ser: 2.7 mg/dL — ABNORMAL HIGH (ref 0.44–1.00)
GFR calc Af Amer: 21 mL/min — ABNORMAL LOW (ref >60)
GFR calc Af Amer: 21 mL/min — ABNORMAL LOW (ref >60)
GFR calc non Af Amer: 18 mL/min — ABNORMAL LOW (ref >60)
GFR calc non Af Amer: 19 mL/min — ABNORMAL LOW (ref >60)
Glucose, Bld: 162 mg/dL — ABNORMAL HIGH (ref 70–99)
Glucose, Bld: 263 mg/dL — ABNORMAL HIGH (ref 70–99)
Phosphorus: 4.2 mg/dL (ref 2.5–4.6)
Phosphorus: 4.2 mg/dL (ref 2.5–4.6)
Potassium: 3.7 mmol/L (ref 3.5–5.1)
Potassium: 4.2 mmol/L (ref 3.5–5.1)
Sodium: 131 mmol/L — ABNORMAL LOW (ref 135–145)
Sodium: 130 mmol/L — ABNORMAL LOW (ref 135–145)

## 2019-03-18 LAB — POCT I-STAT 7, (LYTES, BLD GAS, ICA,H+H)
Acid-base deficit: 2 mmol/L (ref 0.0–2.0)
Bicarbonate: 21.4 mmol/L (ref 20.0–28.0)
Bicarbonate: 24.1 mmol/L (ref 20.0–28.0)
Calcium, Ion: 1.07 mmol/L — ABNORMAL LOW (ref 1.15–1.40)
Calcium, Ion: 1.07 mmol/L — ABNORMAL LOW (ref 1.15–1.40)
HCT: 32 % — ABNORMAL LOW (ref 36.0–46.0)
HCT: 32 % — ABNORMAL LOW (ref 36.0–46.0)
Hemoglobin: 10.9 g/dL — ABNORMAL LOW (ref 12.0–15.0)
Hemoglobin: 10.9 g/dL — ABNORMAL LOW (ref 12.0–15.0)
O2 Saturation: 89 %
O2 Saturation: 99 %
Patient temperature: 98.8
Patient temperature: 98.4
Potassium: 3.8 mmol/L (ref 3.5–5.1)
Potassium: 3.9 mmol/L (ref 3.5–5.1)
Sodium: 132 mmol/L — ABNORMAL LOW (ref 135–145)
Sodium: 132 mmol/L — ABNORMAL LOW (ref 135–145)
TCO2: 22 mmol/L (ref 22–32)
TCO2: 25 mmol/L (ref 22–32)
pCO2 arterial: 29.9 mmHg — ABNORMAL LOW (ref 32.0–48.0)
pCO2 arterial: 36.2 mmHg (ref 32.0–48.0)
pH, Arterial: 7.463 — ABNORMAL HIGH (ref 7.350–7.450)
pH, Arterial: 7.431 (ref 7.350–7.450)
pO2, Arterial: 52 mmHg — ABNORMAL LOW (ref 83.0–108.0)
pO2, Arterial: 131 mmHg — ABNORMAL HIGH (ref 83.0–108.0)

## 2019-03-18 LAB — GLUCOSE, CAPILLARY
Glucose-Capillary: 150 mg/dL — ABNORMAL HIGH (ref 70–99)
Glucose-Capillary: 178 mg/dL — ABNORMAL HIGH (ref 70–99)
Glucose-Capillary: 219 mg/dL — ABNORMAL HIGH (ref 70–99)

## 2019-03-18 LAB — POCT ACTIVATED CLOTTING TIME: Activated Clotting Time: 191 seconds

## 2019-03-18 LAB — CBC
HCT: 27.3 % — ABNORMAL LOW (ref 36.0–46.0)
Hemoglobin: 9.2 g/dL — ABNORMAL LOW (ref 12.0–15.0)
MCH: 27.6 pg (ref 26.0–34.0)
MCHC: 33.7 g/dL (ref 30.0–36.0)
MCV: 82 fL (ref 80.0–100.0)
Platelets: 214 10*3/uL (ref 150–400)
RBC: 3.33 MIL/uL — ABNORMAL LOW (ref 3.87–5.11)
RDW: 16.6 % — ABNORMAL HIGH (ref 11.5–15.5)
WBC: 14.6 10*3/uL — ABNORMAL HIGH (ref 4.0–10.5)
nRBC: 0.1 % (ref 0.0–0.2)

## 2019-03-18 LAB — HEPARIN LEVEL (UNFRACTIONATED): Heparin Unfractionated: 0.36 IU/mL (ref 0.30–0.70)

## 2019-03-18 LAB — PROTIME-INR
INR: 1.8 — ABNORMAL HIGH (ref 0.8–1.2)
Prothrombin Time: 20.7 seconds — ABNORMAL HIGH (ref 11.4–15.2)

## 2019-03-18 MED ORDER — HYDROMORPHONE HCL 1 MG/ML IJ SOLN
1.0000 mg | Freq: Once | INTRAMUSCULAR | Status: AC
  Administered 2019-03-18: 13:00:00 1 mg via INTRAVENOUS
  Filled 2019-03-18: qty 1

## 2019-03-18 MED ORDER — PRISMASOL BGK 4/2.5 32-4-2.5 MEQ/L IV SOLN: INTRAVENOUS | Status: DC

## 2019-03-18 MED ORDER — PRISMASOL BGK 4/2.5 32-4-2.5 MEQ/L REPLACEMENT SOLN: Status: DC

## 2019-03-18 MED ORDER — SODIUM CHLORIDE 0.9 % FOR CRRT
INTRAVENOUS_CENTRAL | Status: DC | PRN
  Filled 2019-03-18: qty 1000

## 2019-03-18 MED ORDER — HEPARIN SODIUM (PORCINE) 1000 UNIT/ML DIALYSIS
1000.0000 [IU] | INTRAMUSCULAR | Status: DC | PRN
  Administered 2019-03-19 – 2019-03-22 (×2): 3000 [IU] via INTRAVENOUS_CENTRAL
  Administered 2019-03-23: 2600 [IU] via INTRAVENOUS_CENTRAL
  Filled 2019-03-18: qty 3
  Filled 2019-03-18 (×3): qty 6
  Filled 2019-03-18 (×2): qty 3
  Filled 2019-03-18: qty 6
  Filled 2019-03-18: qty 3

## 2019-03-18 MED ORDER — POLYETHYLENE GLYCOL 3350 17 G PO PACK
17.0000 g | PACK | Freq: Every day | ORAL | Status: DC
  Administered 2019-03-18 – 2019-03-19 (×2): 17 g via ORAL
  Filled 2019-03-18 (×2): qty 1

## 2019-03-18 NOTE — Progress Notes (Signed)
Patient taken off of bipap and placed on 8L salter high flow nasal cannula.  Tolerating well at this time.  Will continue to monitor.

## 2019-03-18 NOTE — H&P (Signed)
ABG results on iSTAT: PH: 7.46 PaCO2: 29 PaO2: 51 Bicarb: 21  Minute ventilation decreased per results and CCM notified.

## 2019-03-18 NOTE — Plan of Care (Signed)
  Problem: Education: Goal: Ability to demonstrate management of disease process will improve Outcome: Progressing Goal: Ability to verbalize understanding of medication therapies will improve Outcome: Progressing Goal: Individualized Educational Video(s) Outcome: Progressing   Problem: Activity: Goal: Capacity to carry out activities will improve Outcome: Progressing   Problem: Cardiac: Goal: Ability to achieve and maintain adequate cardiopulmonary perfusion will improve Outcome: Progressing   Problem: Education: Goal: Knowledge of General Education information will improve Description: Including pain rating scale, medication(s)/side effects and non-pharmacologic comfort measures Outcome: Progressing   Problem: Health Behavior/Discharge Planning: Goal: Ability to manage health-related needs will improve Outcome: Progressing   Problem: Clinical Measurements: Goal: Ability to maintain clinical measurements within normal limits will improve Outcome: Progressing Goal: Will remain free from infection Outcome: Progressing Goal: Diagnostic test results will improve Outcome: Progressing Goal: Respiratory complications will improve Outcome: Progressing Goal: Cardiovascular complication will be avoided Outcome: Progressing   Problem: Activity: Goal: Risk for activity intolerance will decrease Outcome: Progressing   Problem: Nutrition: Goal: Adequate nutrition will be maintained Outcome: Progressing   Problem: Coping: Goal: Level of anxiety will decrease Outcome: Progressing   Problem: Elimination: Goal: Will not experience complications related to bowel motility Outcome: Progressing Goal: Will not experience complications related to urinary retention Outcome: Progressing   Problem: Pain Managment: Goal: General experience of comfort will improve Outcome: Progressing   Problem: Safety: Goal: Ability to remain free from injury will improve Outcome: Progressing    Problem: Skin Integrity: Goal: Risk for impaired skin integrity will decrease Outcome: Progressing   Problem: Education: Goal: Understanding of CV disease, CV risk reduction, and recovery process will improve Outcome: Progressing Goal: Individualized Educational Video(s) Outcome: Progressing   Problem: Activity: Goal: Ability to return to baseline activity level will improve Outcome: Progressing   Problem: Cardiovascular: Goal: Ability to achieve and maintain adequate cardiovascular perfusion will improve Outcome: Progressing Goal: Vascular access site(s) Level 0-1 will be maintained Outcome: Progressing   Problem: Health Behavior/Discharge Planning: Goal: Ability to safely manage health-related needs after discharge will improve Outcome: Progressing   

## 2019-03-18 NOTE — Progress Notes (Signed)
During shift report today, advised that Pt was on High Flow at 8 to 15 L though the night, and changed to Non-Rebreather,  Pt wanted to eat breakfast, changed to the High flow.  Her sats would go from 80's to 92 while she was eating.  At approximately 9:15, Pt started to desat into the low 80's, she was returned to her Non-rebreather, with the High Flow remaining on. Would go back to the upper 80's but then desat back into the low 80's.   Notified Rapid respond to come and evaluate her. When the came the Pt was in the upper 70's, Respiratory notified, MD notified, and orders received and Pt then tx to [place] for treatment

## 2019-03-18 NOTE — Progress Notes (Signed)
Subjective:    HD yesterday 4L UF  Punch biopsy of subcutaneous lesion yesterday  Remains hypoxic high O2 req   Objective Vital signs in last 24 hours: Vitals:   03/18/19 0412 03/18/19 0831 03/18/19 0835 03/18/19 0951  BP:  (!) 97/59  112/63  Pulse: (!) 59 (!) 57 60 61  Resp: 18 (!) 30 20 (!) 36  Temp:  98.4 F (36.9 C)    TempSrc:  Oral    SpO2:  (!) 89%  (!) 85%  Weight:      Height:       Weight change: -6.7 kg  Intake/Output Summary (Last 24 hours) at 03/18/2019 1035 Last data filed at 03/18/2019 0300 Gross per 24 hour  Intake 676.8 ml  Output 4000 ml  Net -3323.2 ml    Assessment/ Plan: Pt is a 59 y.o. yo female with nonischemic cardiomyopathy/MGUS/DM and baseline CKD (crt 3's)  who was admitted on 03/07/2019 with massive volume overload and crt 5  Assessment/Plan: 1. Renal-  A on CRF.  Had advanced CKD at baseline. Suspect cardiorenal vs DM bu also has M spike.  Per cards-  Heart does not look like amyloid.  Onc has seen, s/p bone marrow biopsy-multiple myeloma.   Now appears to be dialysis dependent since 1/15, I do not anticipate that she will recover and she will be ESRD.  Remains significantly hypervolemic and hypoxic.  Now movintg to ICU for BiPAP. Start CRRT for further UF 100-257m/h net negative. All 4K bath, no heparin. [facility]  [facility] for [facility] 2. Vol/htn-improved but remains volume overloaded and relatively hypotensive-  As above. On midodrine. Did not respond to high dose diuretics,  3. Anemia-multifactorial including #1, myeloma, hematology following.  ESA dosing at their discretion  4. 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  Biopsy done 03/16/18 with  [facility], path pending.  She is on calcitriol which we will stop.  We  will also stop cholecalciferol.  Calciphylaxis would be unusual in the circumstances but certainly on the differential. If confirms calciphylaxis would also offer her thiosulfate..  I would not resume warfarin moving forward but could receive apixaban.  Pain control per primary. 5. Hyperkalemia-  Resolved    6. Hyponatremia-  Likely hypervolemic hyponatremia-  Improving as volume does  RRexene Agent   Labs: Basic Metabolic Panel: Recent Labs  Lab 03/16/19 0243 03/17/19 0144 03/18/19 0247  NA 131* 130* 131*  K 3.9 4.1 3.7  CL 94* 95* 94*  CO2 22 21* 23  GLUCOSE 125* 210* 162*  BUN 42* 29* 26*  CREATININE 2.80* 2.69* 2.72*  CALCIUM 7.8* 7.9* 8.4*  PHOS 4.7* 4.6 4.2   Liver Function Tests: Recent Labs  Lab 03/16/19 0243 03/17/19 0144 03/18/19 0247  ALBUMIN 2.2* 2.5* 2.5*   No results for input(s): LIPASE, AMYLASE in the last 168 hours. No results for input(s): AMMONIA in the last 168 hours. CBC: Recent Labs  Lab 03/12/19 0208 03/12/19 0208 03/13/19 0213 03/13/19 0213 03/14/19 0231 03/14/19 0231 03/15/19 0644 03/15/19 0644 03/16/19 0243 03/17/19 0144 03/18/19 0247  WBC 7.2   < > 8.8   < > 9.2   < > 9.8   < > 12.3* 15.2* 14.6*  NEUTROABS 5.7  --  7.5  --   --   --   --   --   --   --   --  HGB 9.5*   < > 9.6*   < > 9.6*   < > 9.6*   < > 9.2* 9.1* 9.2*  HCT 27.6*   < > 27.8*   < > 28.0*   < > 28.5*   < > 27.4* 26.7* 27.3*  MCV 80.0   < > 79.7*   < > 80.7  --  82.8  --  81.3 81.2 82.0  PLT 141*   < > 144*   < > 128*   < > 150   < > 184 196 214   < > = values in this interval not displayed.   Cardiac Enzymes: No results for input(s): CKTOTAL, CKMB, CKMBINDEX, TROPONINI in the last 168 hours. CBG: Recent Labs  Lab 03/17/19 0616 03/17/19 1145 03/17/19 1726 03/17/19 2134 03/18/19 0627  GLUCAP 178* 216* 127* 168* 150*    Iron Studies:  No results for input(s): IRON, TIBC, TRANSFERRIN, FERRITIN in the last 72 hours. Studies/Results: DG CHEST PORT 1  VIEW  Result Date: 03/18/2019 CLINICAL DATA:  Shortness of breath EXAM: PORTABLE CHEST 1 VIEW COMPARISON:  03/17/2019 FINDINGS: Right-sided IJ approach central venous catheter with distal tip terminating at the level of the superior cavoatrial junction. Stable positioning of left-sided implanted cardiac device. Stable cardiomegaly. Calcified thoracic aorta. No new focal airspace consolidation, pleural effusion, or pneumothorax. IMPRESSION: Stable exam.  No active disease. Electronically Signed   By: Davina Poke D.O.   On: 03/18/2019 10:19   DG CHEST PORT 1 VIEW  Result Date: 03/17/2019 CLINICAL DATA:  CHF EXAM: PORTABLE CHEST 1 VIEW COMPARISON:  03/07/2019 FINDINGS: Cardiac enlargement with AICD. Negative for heart failure. Atherosclerotic aortic arch. Lungs are clear without infiltrate or effusion. IMPRESSION: No active disease. Electronically Signed   By: Franchot Gallo M.D.   On: 03/17/2019 10:58   Medications: Infusions: .  prismasol BGK 4/2.5    .  prismasol BGK 4/2.5    . sodium chloride 10 mL/hr at 03/14/19 1615  . heparin 1,500 Units/hr (03/18/19 0903)  . prismasol BGK 4/2.5      Scheduled Medications: . Chlorhexidine Gluconate Cloth  6 each Topical Q0600  . insulin aspart  0-9 Units Subcutaneous TID WC  . lidocaine (PF)  5 mL Other Once  . lidocaine  10 mL Intradermal Once  . midodrine  10 mg Oral TID WC  . sodium chloride flush  3 mL Intravenous Q12H  . sodium chloride flush  3 mL Intravenous Q12H  . vitamin B-12  100 mcg Oral Daily    have reviewed scheduled and prn medications.  Physical Exam: General: alert, pleasan  Heart: RRR Lungs: dec BS at bases Abdomen: soft, non tender Extremities: pitting edema throughout    03/18/2019,10:35 AM  LOS: 10 days

## 2019-03-18 NOTE — Progress Notes (Addendum)
ANTICOAGULATION CONSULT NOTE - Ward for heparin Indication: atrial fibrillation  Allergies  Allergen Reactions  . Wheat Swelling  . Latex Rash  . Penicillins Rash  . Sulfa Antibiotics Rash    Patient Measurements: Height: 5\' 6"  (167.6 cm) Weight: (pt unable to stand due to o2 drops in 80's) IBW/kg (Calculated) : 59.3  Vital Signs: Temp: 98.4 F (36.9 C) (01/21 0831) Temp Source: Oral (01/21 0831) BP: 97/59 (01/21 0831) Pulse Rate: 60 (01/21 0835)  Labs: Recent Labs    03/16/19 0243 03/16/19 0243 03/17/19 0144 03/18/19 0247  HGB 9.2*   < > 9.1* 9.2*  HCT 27.4*  --  26.7* 27.3*  PLT 184  --  196 214  LABPROT 19.7*  --  20.5* 20.7*  INR 1.7*  --  1.8* 1.8*  HEPARINUNFRC 0.33  --  0.32 0.36  CREATININE 2.80*  --  2.69* 2.72*   < > = values in this interval not displayed.    Estimated Creatinine Clearance: 23.9 mL/min (A) (by C-G formula based on SCr of 2.72 mg/dL (H)).   Assessment: 59 yo female with hx AFib, Coumadin on hold, continue heparin bridge.   Heparin level within goal range, no overt bleeding or complications noted. No plans per renal to restart warfarin, may use apixaban.   Goal of Therapy:  Heparin level goal 0.3-0.7 Monitor platelets by anticoagulation protocol: Yes   Plan:  Continue IV heparin at current rate. Daily heparin level and CBC. No current plans for oral anticoagulation  Erin Hearing PharmD., BCPS Clinical Pharmacist 03/18/2019 9:52 AM

## 2019-03-18 NOTE — Significant Event (Signed)
Rapid Response Event Note  Overview: Respiratory   Initial Focused Assessment: Nurse called with concerns of patient needing more oxygen this morning. Per nurse, patient is now on NRB 15L and HFNC 15L and oxygen saturations were in the mid-upper 80s. I asked the primary provider be contacted. Upon arrival, patient was respiratory distress, her RR was in the 40s, + increased WOB and SOB, + use of accessory muscles and her saturations were 73% with a good pleth. I asked the staff to call RT and have the RT bring a BIPAP. We immediately placed the patient on BIPAP 100% 18/10 and it took well over 15 minutes before patient's oxygen saturations improved above 85%. However her RR did not improve overall. CVP 22, lung sounds - clear in upper fields - diminished in the lower fields. AHF team at bedside. Decision made to transfer to [place] ICU for CVVH and further management.   Interventions: -- CXR STAT -- ABG STAT  -- BIPAP   Plan of Care: -- Urgent transfer to [place] for CVVH    Event Summary:  Call Time 0934  Arrival Time 0937 End Time [place], [place]

## 2019-03-18 NOTE — Consult Note (Addendum)
PULMONARY / CRITICAL CARE MEDICINE   NAME:  [name], MRN:  [id number], DOB:  10/30/1960, LOS: 60 ADMISSION DATE:  03/07/2019, CONSULTATION DATE:   REFERRING MD:  Dr. 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   HISTORY OF PRESENT ILLNESS    [name] neck is a 59 year old female with chronic A. fib, hepatic cirrhosis, acute on chronic systolic heart failure ef 15-20% with ic, acute on chronic renal failure, thrombocytopenia, congenital third-degree AV block with biventricular ICD which was upgraded 5 years ago, diabetes mellitus type 2, and gout who was admitted on 03/07/2019 for increased swelling in his legs and abdomen, having gained 3 kg since 03/03/2019, orthopnea, PND, productive cough.  Patient became hypoxic earlier today down to 50% and tachypnea. She required increased oxygen upto 15L via HFNC and NRB and subsequently placed on bipap. CVP was 22. Patient transferred to ICU for management of acute hypoxic   SIGNIFICANT PAST MEDICAL HISTORY   Automatic implantable cardioverter-defibrillator in situ, Chronic anticoagulation, Chronic atrial fibrillation (HCC), Chronic kidney disease, Chronic systolic CHF (congestive heart failure) ([place]), Congenital third degree heart block, Dizziness and giddiness (05/19/2012), Dysphagia (08/14/2011), GERD (gastroesophageal reflux disease), Helicobacter pylori gastritis (08/14/2011), IDDM (insulin dependent diabetes mellitus), Kidney stones (1990's), Nonischemic cardiomyopathy ([place]), Potassium (K) excess, and Stroke ([place]) (1999).  SIGNIFICANT EVENTS:  1/10 admitted to [facility] for acute on chronic heart failure exacerbation  1/20 punch biopsy done   1/21 transferred to icu for acute hypoxic respiratory failure  STUDIES:   TTE 1/12  1. Left ventricular ejection fraction, by visual estimation, is <20%. The left ventricle has severely decreased function. There is no left ventricular hypertrophy.  2. Definity contrast agent was given IV to delineate the left ventricular endocardial borders.  3. Abnormal septal motion consistent with left bundle branch block.  4. Left ventricular diastolic parameters are consistent with Grade III diastolic dysfunction (restrictive).  5. Moderately dilated left ventricular internal cavity size.  6. The left ventricle demonstrates global hypokinesis.  7. Global right ventricle has mildly reduced systolic function.The right ventricular size is severely enlarged. No increase in right ventricular wall thickness.  8. Left atrial size was severely dilated.  9. Right atrial size was severely dilated. 10. Trivial pericardial effusion is present. 11. The mitral valve is normal in structure. No evidence of mitral valve regurgitation. 12. The tricuspid valve is normal in structure. 13. The aortic valve is normal in structure. Aortic valve regurgitation is not visualized. Mild aortic valve sclerosis without stenosis. 14. Pulmonic regurgitation is moderate. 15. The pulmonic valve was normal in structure. Pulmonic valve regurgitation is moderate. 16. Mildly dilated pulmonary artery. 17. Mildly elevated pulmonary artery systolic pressure. 18. The tricuspid regurgitant velocity is 2.32 m/s, and with an assumed right atrial pressure of 15 mmHg, the estimated right ventricular systolic pressure is mildly elevated at 36.6 mmHg. 19. A pacer wire is visualized. 20. The inferior vena cava is dilated in size with <50% respiratory variability, suggesting right atrial pressure of 15 mmHg. 21. No intracardiac thrombi or masses were visualized. 22. No significant change from prior study (April 10, 2017). 23. Prior images reviewed  side by side  Right heart cath 03/09/2019 1. Right >  left heart failure with low PAPI.  2. Pulmonary venous hypertension.  3. Adequate cardiac output on dobutamine 2.5 mcg/kg/min  CULTURES:   MRSA PCR 1/11 negative  Respiratory viral panel 1/10 neg covid, neg flu a and b   ANTIBIOTICS:    LINES/TUBES:  Peripheral IV right hand 1/15 Peripheral Iv left forearm 1/17  CONSULTANTS:  Nephrology Heart Failure  Surgery  SUBJECTIVE:  Patient states that she has severe pain in her bilateral lower extremities and abdomen. Husband who is a Theme park manager at [employer] is at bedside.   CONSTITUTIONAL: BP 112/63   Pulse 61   Temp 98.4 F (36.9 C) (Oral)   Resp (!) 36   Ht 5' 6"  (1.676 m)   Wt 80.9 kg   SpO2 (!) 85%   BMI 28.79 kg/m   I/O last 3 completed shifts: In: 2118.4 [P.O.:1280; I.V.:470.7; IV Piggyback:367.8] Out: 4000 [Other:4000]  CVP:  [14 mmHg-20 mmHg] 20 mmHg  FiO2 (%):  [100 %] 100 %  PHYSICAL EXAM: General:  Appears to be in distress/pain Neuro:  Alert, oriented, able to answer questions HEENT:  With bipap mask on  Cardiovascular:  RRR, s1 and s2 audible Lungs:  CTAB, no rales or rhonchi Abdomen:  Firm abdomen, that is very tender Musculoskeletal:  Edematous extremities Skin:  Hyperpigmented areas on bilateral extremities   ASSESSMENT AND PLAN    Acute hypoxemic respiratory failure Acute respiratory alkalosis DVT done 03/13/2019 showed no DVT in right lower extremity, no left femoral vein obstruction.  Portable chest x-ray 03/18/2019 showing good inspiration, good penetration but appears to be cardiomegaly on AP film, no significant venous congestion or opacities, no pleural effusions.  Distal tip of right IJ at superior cavoatrial junction.  I-STAT showing 7.46, PO2 51, PCO2 29, bicarb 21.  Acute on chronic systolic heart failure Longstanding NICM [employer] CRT-D TTE done 03/09/2019 showed LVEF <48%, grade 3 diastolic dysfunction, severely dilated  left atrium and right atria, trivial pericardial effusion, global right ventricular mildly reduced systolic function, mildly dilated pulmonary artery, mild aortic loss sclerosis without stenosis.  Right heart cath done 1212 showed right >left heart failure with low PA PI, pulmonary venous hypertension, adequate cardiac output on dobutamine.  Amyloidosis less likely based on absence of findings on tte.   -follow cvp -continue midodrine 23m tid  -crrt to diurese  -cannot use digoxin, acei/arb due to aki  -cannot use b blocker due to concern for low output  -hypotensive so cannot do nitrates/hydralazine  Acute on chronic renal failure Thought to be secondary to diabetes mellitus, cardiorenal syndrome, MGUS.  Nephrology feels that the patient is now dialysis dependent and will likely be leaving end-stage renal disease.  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  Protein electrophoresis showing M spike 1.2%, albumin 2.6. Bone marrow biopsy showing 14% plasma cells. Oncology plans to do chemotherapy outpatient.   -appreciate oncology following   Chronic atrial fibrillation  Rates ranging 50-60s.   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 Punch biopsy done by general surgery 1/20.  Abdominal tenderness  Firm abdomen noted on examination. CT abdomen done 1/11 does not show any acute abnormality.   Possibly constipation. Unknown if patient has had bowel movements.   -will give miralax -dilaudid 144monce  for pain  Best Practice / Goals of Care / Disposition.   DVT PROPHYLAXIS: scds SUP: n/a NUTRITION: renal diet MOBILITY: bed rest GOALS OF CARE: Full code FAMILY DISCUSSIONS: Updated patient and husband at  bedside DISPOSITION ICU  LABS  Glucose Recent Labs  Lab 03/16/19 2056 03/17/19 0616 03/17/19 1145 03/17/19 1726 03/17/19 2134 03/18/19 0627  GLUCAP 192* 178* 216* 127* 168* 150*    BMET Recent Labs  Lab 03/16/19 0243 03/17/19 0144 03/18/19 0247  NA 131* 130* 131*  K 3.9 4.1 3.7  CL 94* 95* 94*  CO2 22 21* 23  BUN 42* 29* 26*  CREATININE 2.80* 2.69* 2.72*  GLUCOSE 125* 210* 162*    Liver Enzymes Recent Labs  Lab 03/16/19 0243 03/17/19 0144 03/18/19 0247  ALBUMIN 2.2* 2.5* 2.5*    Electrolytes Recent Labs  Lab 03/16/19 0243 03/17/19 0144 03/18/19 0247  CALCIUM 7.8* 7.9* 8.4*  PHOS 4.7* 4.6 4.2    CBC Recent Labs  Lab 03/16/19 0243 03/17/19 0144 03/18/19 0247  WBC 12.3* 15.2* 14.6*  HGB 9.2* 9.1* 9.2*  HCT 27.4* 26.7* 27.3*  PLT 184 196 214    ABG No results for input(s): PHART, PCO2ART, PO2ART in the last 168 hours.  Coag's Recent Labs  Lab 03/16/19 0243 03/17/19 0144 03/18/19 0247  INR 1.7* 1.8* 1.8*    Sepsis Markers No results for input(s): LATICACIDVEN, PROCALCITON, O2SATVEN in the last 168 hours.  Cardiac Enzymes No results for input(s): TROPONINI, PROBNP in the last 168 hours.  PAST MEDICAL HISTORY :   She  has a past medical history of Automatic implantable cardioverter-defibrillator in situ, Chronic anticoagulation, Chronic atrial fibrillation (HCC), Chronic kidney disease, Chronic systolic CHF (congestive heart failure) ([place]), Congenital third degree heart block, Dizziness and giddiness (05/19/2012), Dysphagia (08/14/2011), GERD (gastroesophageal reflux disease), Helicobacter pylori gastritis (08/14/2011), IDDM (insulin dependent diabetes mellitus), Kidney stones (1990's), Nonischemic cardiomyopathy ([place]), Potassium (K) excess, and Stroke ([place]) (1999).  PAST SURGICAL HISTORY:  She  has a past surgical history that includes Colonoscopy (04/23/2011); Upper gastrointestinal endoscopy (FEB 2013); Bi-ventricular implantable  cardioverter defibrillator  (crt-d) (04/21/2013); Pacemaker generator change (01/2004); Insert / replace / remove pacemaker (09/1997); bi-ventricular implantable cardioverter defibrillator upgrade (N/A, 04/21/2013); RIGHT HEART CATH (N/A, 03/09/2019); IR US Guide Vasc Access Right (03/12/2019); and IR Fluoro Guide CV Line Right (03/12/2019).  Allergies  Allergen Reactions  . Wheat Swelling  . Latex Rash  . Penicillins Rash  . Sulfa Antibiotics Rash    No current facility-administered medications on file prior to encounter.   Current Outpatient Medications on File Prior to Encounter  Medication Sig  . ACCU-CHEK GUIDE test strip 1 each by Other route 4 (four) times daily.   Marland Kitchen acetaminophen (TYLENOL) 500 MG tablet Take 500 mg by mouth every 6 (six) hours as needed for moderate pain.  . Alcohol Swabs (ALCOHOL PREP) PADS 1 each by Does not apply route daily.  Marland Kitchen allopurinol (ZYLOPRIM) 100 MG tablet Take 150 mg by mouth every morning. Reported on 06/15/2015  . benazepril (LOTENSIN) 10 MG tablet Take 10 mg by mouth daily.  . calcitRIOL (ROCALTROL) 0.25 MCG capsule Take 0.5 mcg by mouth daily. Take 2 tablets by mouth once daily.  . cholecalciferol (VITAMIN D3) 25 MCG (1000 UT) tablet Take 1,000 Units by mouth daily.  Marland Kitchen COREG CR 40 MG 24 hr capsule TAKE ONE CAPSULE BY MOUTH DAILY.  . digoxin (LANOXIN) 0.125 MG tablet TAKE 1 TABLET BY MOUTH DAILY EXCEPT ON SATURDAY AND SUNDAY. (Patient taking differently:  Take 0.125 mg by mouth daily. Except on Saturday and Sunday)  . insulin NPH Human (HUMULIN N) 100 UNIT/ML injection Inject 0.34 mLs (34 Units total) into the skin daily. (Patient taking differently: Inject 22 Units into the skin daily. )  . Insulin Pen Needle 31G X 5 MM MISC 1 each by Does not apply route daily. Use as instructed to check blood sugar 4 times daily.  . Insulin Syringe-Needle U-100 30G 1 ML MISC 1 each by Does not apply route daily. Use needle to inject insulin 3 times daily.  Marland Kitchen LITETOUCH PEN  NEEDLES 31G X 8 MM MISC USE AS DIRECTED UP TO 4 TIMES DAILY. (Patient taking differently: See admin instructions. )  . meclizine (ANTIVERT) 12.5 MG tablet Take 12.5 mg by mouth 3 (three) times daily as needed.  . metolazone (ZAROXOLYN) 2.5 MG tablet Take 2.5 mg by mouth daily as needed.  Marland Kitchen NOVOLOG FLEXPEN 100 UNIT/ML FlexPen INJECT SUBCUTANEOUSLY 12 UNITS BEFORE BREAKFAST; 14 UNITS AT LUNCH; AND 16 UNITS AT DINNER. (Patient taking differently: See admin instructions. INJECT SUBCUTANEOUSLY 12 UNITS BEFORE BREAKFAST; 14 UNITS AT LUNCH; AND 16 UNITS AT DINNER)  . SURE COMFORT INSULIN SYRINGE 31G X 5/16" 0.5 ML MISC INJECT ONCE DAILY AS DIRECTED. (Patient taking differently: See admin instructions. INJECT ONCE DAILY AS DIRECTED.)  . torsemide (DEMADEX) 20 MG tablet Take 1 tablet (20 mg total) by mouth 2 (two) times daily.  . traMADol (ULTRAM) 50 MG tablet Take 1 tablet (50 mg total) by mouth 2 (two) times daily as needed.  . TRESIBA FLEXTOUCH 200 UNIT/ML SOPN INJECT 56 UNITS INTO THE SKIN DAILY BEFORE BREAKFAST. (Patient taking differently: Inject 56 Units into the skin daily before breakfast. )  . VICTOZA 18 MG/3ML SOPN INJECT 1.2MG SUBCUTANEOUSLY DAILY AS DIRECTED. (Patient taking differently: Inject 1.2 mg into the skin daily. INJECT 1.2MG SUBCUTANEOUSLY DAILY AS DIRECTED.)  . warfarin (COUMADIN) 5 MG tablet TAKE 1/2 DAILY EXCEPT ON WEDNESDAY TAKE 1 TABLET. (Patient taking differently: Take 2.5 mg by mouth daily. TAKE 1/2 DAILY.)  . diclofenac Sodium (VOLTAREN) 1 % GEL APPLY TO AFFECTED AREASETWICE DAILY.    FAMILY HISTORY:   Her family history includes Autism in her son; Diabetes in her daughter; Seizures in her son. There is no history of Colon cancer or Colon polyps. She was adopted.  SOCIAL HISTORY:  She  reports that she has never smoked. She has never used smokeless tobacco. She reports that she does not drink alcohol or use drugs.  REVIEW OF SYSTEMS:    Patient is on bipap

## 2019-03-18 NOTE — Progress Notes (Addendum)
Patient ID: [name], female   DOB: 1960/04/24, 59 y.o.   MRN: [id number]     Advanced Heart Failure Rounding Note  PCP-Cardiologist: Kate Sable, MD   Subjective:   Overnight increased oxygen requirements. SOB at rest. Now on 100% NRB.   Today sats dropped to 76%. Increased shortness if breath.   Echo: EF < 20%, no LVH, mildly reduced RV systolic function, severe biatrial enlargement, severe TR.  RHC Procedural Findings (dobutamine 2.5 mcg/kg/min): Hemodynamics (mmHg) RA 18 RV 43/18 PA 45/19, mean 28 PCWP mean 19 Oxygen saturations: PA 63% AO 94% Cardiac Output (Fick) 6.1  Cardiac Index (Fick) 3.06 PVR 1.5 WU PAPI 1.4   Objective:   Weight Range: 80.9 kg Body mass index is 28.79 kg/m.   Vital Signs:   Temp:  [97.3 F (36.3 C)-98.4 F (36.9 C)] 98.4 F (36.9 C) (01/21 0831) Pulse Rate:  [57-60] 60 (01/21 0835) Resp:  [16-30] 20 (01/21 0835) BP: (90-105)/(51-67) 97/59 (01/21 0831) SpO2:  [71 %-99 %] 89 % (01/21 0831) Weight:  [80.9 kg-85 kg] 80.9 kg (01/20 1549) Last BM Date: 03/16/19  Weight change: Filed Weights   03/17/19 0531 03/17/19 1230 03/17/19 1549  Weight: 84 kg 85 kg 80.9 kg    Intake/Output:   Intake/Output Summary (Last 24 hours) at 03/18/2019 0956 Last data filed at 03/18/2019 0300 Gross per 24 hour  Intake 676.8 ml  Output 4000 ml  Net -3323.2 ml      Physical Exam  CVP 22-23 General: Using accessory muscles. On NRB HEENT: normal Neck: supple. JVP to jaw . Carotids 2+ bilat; no bruits. No lymphadenopathy or thryomegaly appreciated. Cor: PMI nondisplaced. Regular rate & rhythm. No rubs, gallops or murmurs. Lungs: Decreased in the bases. Abdomen: soft, nontender, nondistended. No hepatosplenomegaly. No bruits or masses. Good bowel sounds. Extremities: no cyanosis, clubbing, rash. R and LLE 1-2+  Edema. Indruated areas on R and L upper thighs.  Neuro: alert & orientedx3, cranial nerves grossly intact. moves all 4 extremities  w/o difficulty.     Telemetry   A fib BiV pacing 60-70s   Labs    CBC Recent Labs    03/17/19 0144 03/18/19 0247  WBC 15.2* 14.6*  HGB 9.1* 9.2*  HCT 26.7* 27.3*  MCV 81.2 82.0  PLT 196 263   Basic Metabolic Panel Recent Labs    03/17/19 0144 03/18/19 0247  NA 130* 131*  K 4.1 3.7  CL 95* 94*  CO2 21* 23  GLUCOSE 210* 162*  BUN 29* 26*  CREATININE 2.69* 2.72*  CALCIUM 7.9* 8.4*  PHOS 4.6 4.2   Liver Function Tests Recent Labs    03/17/19 0144 03/18/19 0247  ALBUMIN 2.5* 2.5*   No results for input(s): LIPASE, AMYLASE in the last 72 hours. Cardiac Enzymes No results for input(s): CKTOTAL, CKMB, CKMBINDEX, TROPONINI in the last 72 hours.  BNP: BNP (last 3 results) Recent Labs    03/07/19 2101  BNP 1,072.0*    ProBNP (last 3 results) No results for input(s): PROBNP in the last 8760 hours.   D-Dimer No results for input(s): DDIMER in the last 72 hours. Hemoglobin A1C No results for input(s): HGBA1C in the last 72 hours. Fasting Lipid Panel No results for input(s): CHOL, HDL, LDLCALC, TRIG, CHOLHDL, LDLDIRECT in the last 72 hours. Thyroid Function Tests No results for input(s): TSH, T4TOTAL, T3FREE, THYROIDAB in the last 72 hours.  Invalid input(s): FREET3  Other results:   Imaging    DG CHEST PORT 1  VIEW  Result Date: 03/17/2019 CLINICAL DATA:  CHF EXAM: PORTABLE CHEST 1 VIEW COMPARISON:  03/07/2019 FINDINGS: Cardiac enlargement with AICD. Negative for heart failure. Atherosclerotic aortic arch. Lungs are clear without infiltrate or effusion. IMPRESSION: No active disease. Electronically Signed   By: Franchot Gallo M.D.   On: 03/17/2019 10:58     Medications:     Scheduled Medications: . Chlorhexidine Gluconate Cloth  6 each Topical Q0600  . insulin aspart  0-9 Units Subcutaneous TID WC  . lidocaine (PF)  5 mL Other Once  . lidocaine  10 mL Intradermal Once  . midodrine  10 mg Oral TID WC  . sodium chloride flush  3 mL Intravenous  Q12H  . sodium chloride flush  3 mL Intravenous Q12H  . vitamin B-12  100 mcg Oral Daily    Infusions: .  prismasol BGK 4/2.5    .  prismasol BGK 4/2.5    . sodium chloride 10 mL/hr at 03/14/19 1615  . heparin 1,500 Units/hr (03/18/19 0903)  . prismasol BGK 4/2.5      PRN Medications: sodium chloride, acetaminophen **OR** acetaminophen, fentaNYL (SUBLIMAZE) injection, heparin, heparin, lidocaine, ondansetron **OR** ondansetron (ZOFRAN) IV, oxyCODONE, sodium chloride, sodium chloride, sodium chloride flush, traMADol    Assessment/Plan   1. Acute Hypoxic Respiratory Failure - Sats dropped to the 70s. Placed on 100% NRB. Sats low 80s.  - Check ABG now - Check CXR now - Place BIPAP now. -----> Sats improved to upper 88-89%.  2. Acute on chronic systolic CHF: Longstanding NICM.  Last echo in 2/19 with EF 15-20%. She has a Chemical engineer CRT-D device. Admitted with progressive dyspnea and 30 lb weight gain. Empirically started on dobutamine 2.5 due to concern for low output. RHC showed R>L heart failure with low PAPI adequate cardiac output on dobutamine.  She has started iHD, tolerates well.  Now off dobutamine.  Still volume overloaded with significant oxygen requirement. Minimal response to Lasix/metolazone.  - CVP 22-23. Acutely short of breath. Discussed  With nephrology. Start CVVHD.  - Gett CXR now.  - Continue midodrine 10 mg tid with relative hypotension.  - Stopped digoxin and benazepril with AKI.  - Stopped beta blocker with concern for low output.  - No BP room for hydralazine/nitrates.  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  2. AKI on CKD stage 4: Baseline creatinine around 3, went up to 5 with BUN 150 this admission. Suspect cardiorenal syndrome superimposed on possible diabetic nephropathy. Suspect she will be HD dependent.  - CVP up to 22-23. Start CVVHD today.  -Nephrology appreciated.  3. Chronic atrial  fibrillation: Warfarin held for procedures, now on heparin gtt. Long-term, will transition to apixaban with some concern for warfarin skin necrosis.    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  5. DM: Per primary.  6. Monoclonal gammopathy:  LV morphology does not look concerning for AL amyloidosis. She has been seen by hematology, has had bone marrow bx showing 14% plasma cells by flow cytometry on the aspirate which is by definition multiple myeloma. Seen by heme/onc, plan for outpatient treatment.  7. Tricuspid regurgitation: Severe by echo.  8  Subcutaneous nodules: ?calciphylaxis versus less likely warfarin skin necrosis.  - Off warfarin, will use Eliquis in future.  - Surgery consulted for biopsy.  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   Move to ICU now. Placed on Bipap with improved sats. May need intubation if she doesn't improve. Starting CVVHD.  Length of Stay: [place], NP  03/18/2019, 9:56 AM  Advanced Heart Failure Team Pager [phone] (M-F; 7a - 4p)  Please contact [facility] for night-coverage after hours (4p -7a ) and weekends on [email or web address]  Patient seen with NP, agree with the above note.  She was acutely short of breath this morning.  CVP 22-23 despite HD for several days and weight down.  She required initiation of Bipap.  No new abnormality on CXR.  She has been on heparin gtt.   General: NAD Neck: JVP 14+, no thyromegaly or thyroid nodule.  Lungs: Decreased at bases. CV: Lateral PMI.  Heart regular S1/S2, no S3/S4, 3/6 HSM LLSB.  1+ edema to knees.   Abdomen: Soft, nontender, no hepatosplenomegaly, no distention.  Skin: Intact without lesions or rashes.  Neurologic: Alert and oriented x 3.  Psych: Normal affect. Extremities: No clubbing or cyanosis.  HEENT: Normal.  .  Worsening shortness of breath, has had significant oxygen requirement though CXR is not very impressive.  With CVP still 22-23 despite HD, I suspect this is due to volume overload.  PE  seems unlikely as she has been on heparin gtt.  No fever, WBCs only mildly elevated.  - Bipap - ABG - Transfer to ICD for CVVH => continuous fluid removal via CVVH until oxygen saturation improves.   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   Will need treatment of multiple myeloma, new diagnosis.   CRITICAL CARE Performed by: Loralie Champagne  Total critical care time: 35 minutes  Critical care time was exclusive of separately billable procedures and treating other patients.  Critical care was necessary to treat or prevent imminent or life-threatening deterioration.  Critical care was time spent personally by me on the following activities: development of treatment plan with patient and/or surrogate as well as nursing, discussions with consultants, evaluation of patient's response to treatment, examination of patient, obtaining history from patient or surrogate, ordering and performing treatments and interventions, ordering and review of laboratory studies, ordering and review of radiographic studies, pulse oximetry and re-evaluation of patient's condition.  Loralie Champagne 03/18/2019 1:53 PM

## 2019-03-18 NOTE — Progress Notes (Signed)
Page Dr. Darrick Meigs to beside. Rapid response nurse at bedside along with HF team.

## 2019-03-18 NOTE — Progress Notes (Signed)
PT Cancellation Note  Patient Details Name: CARMIE LANPHER MRN: [id number] DOB: 09/19/1960   Cancelled Treatment:    Reason Eval/Treat Not Completed: Medical issues which prohibited therapy. Per discussion with RN pt is again requiring increased supplemental oxygen, up to 12L HFNC form 8L last night at rest. RN requesting PT hold at this time due to tenuous respiratory status.   Zenaida Niece 03/18/2019, 9:34 AM

## 2019-03-18 NOTE — Progress Notes (Signed)
Renal Navigator notes patient's desire to stay with [facility] providers who have cared for her during this hospitalization and will refer her to [facility] for OP HD. Navigator also notes her transfer to ICU today and will follow up tomorrow to see how she is doing medically.   Alphonzo Cruise, [facility]al Navigator [phone]

## 2019-03-18 NOTE — Progress Notes (Signed)
[facility]  PROGRESS NOTE  [name] MRN:[id number] DOB: 08/02/1960 DOA: 03/07/2019 PCP: Fanta, Tesfaye, MD   Brief HPI:   58-year-old female with a history of MGUS, thrombocytopenia, atrial fibrillation on Coumadin, complete heart block with pacemaker, nonischemic cardiomyopathy with EF 15 to 20% with ICD placement, CKD stage IV at baseline creatinine 3.0, liver cirrhosis presented with several weeks of progressive swelling about 30 pound weight gain.  In the ED she was found to have creatinine more than 5, anasarca with hemoglobin 11.  Chest x-ray was clear but she required 2 L of supplemental oxygen.  Patient received Lasix 40 g IV in the ED without significant urine output so she was transferred to [facility] for evaluation by heart failure team.  Patient was started on inotrope, did not respond to inotropic and IV Lasix.  She was started on hemodialysis.  She underwent evaluation for multiple myeloma due to abnormal SPEP/UPEP.  Underwent bone marrow biopsy, preliminary report showed 14% plasma cells.  Oncology is following, and considering to start chemotherapy as outpatient.    Subjective   Patient seen and examined, still requiring more than 10 L high flow nasal cannula this morning.  I was again called by RN to evaluate patient who had desatted to 70s after nonrebreather was taken off and she was put back on high flow nasal cannula.  Dr. McLean was at bedside and told me that patient removed to ICU for CVVHD and BiPAP.   Assessment/Plan:     1. Acute hypoxic respiratory failure-secondary to acute on chronic systolic heart failure-patient's ejection fraction was 1520% in 2019.  She was started on dobutamine drip which was discontinued as patient did not improve.  Also started on high-dose Lasix 160 mg 3 times daily, since patient was not responding to inotropic treatment she underwent hemodialysis on 03/12/2019, 03/13/2019 and 03/16/2019.  She is on midodrine.  Lasix has  been discontinued.  Patient will be transferred to ICU for BiPAP and CVVHD.  2. Acute on chronic kidney disease stage IV-patient's prior creatinine 2.9-3.5 last year.  Nephrology was consulted for CRRT.  She underwent hemodialysis starting from 03/12/2019.  Nephrology following.  Likely will need CVVHD as above.  3. Anemia/MGUS with thrombocytopenia-patient is followed by oncology as outpatient.  Oncology was consulted as recent labs showed elevated serum IgG, urine M spike and elevated light chains.  She underwent repeat protein electrophoresis on 03/09/2019 which showed M spike, with elevated free kappa 2004.2 and lambda 46.6 light chains.  Patient underwent bone marrow biopsy which showed 14% plasma cells, findings consistent with plasma cell myeloma.  Dr. Dorsey evaluated the patient, and plan to start chemotherapy as outpatient for multiple myeloma.  4. Chronic atrial fibrillation-Coumadin was held in anticipation for procedure.  Currently on heparin gtt. will not restart Coumadin, considering she may have warfarin induced skin necrosis on the right thigh.  Consider starting on apixaban.  5. Diabetes mellitus-continue sliding scale insulin with NovoLog.  6. Palpable mass in lower abdominal wall-CT abdomen was negative for hematoma.  7. Right thigh swelling-CT right femur showed no acute osseous abnormality or soft tissue hematoma.  10 nonloculated fluid adjacent to the deep fascial layer/iliotibial tract overlying the greater trochanter which could represent a posttraumatic seroma/mild Morel Lavallee lesion.  Diffuse subcutaneous edema and skin thickening surrounding the right lower extremity.  Patient underwent punch biopsy of right thigh.  Result is currently pending.  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    Will follow the results.     SpO2: 99 % O2 Flow Rate (L/min): 8 L/min FiO2 (%): 100 %    Lab Results  Component Value Date    SARSCOV2NAA NEGATIVE 03/07/2019     CBG: Recent Labs  Lab 03/17/19 0616 03/17/19 1145 03/17/19 1726 03/17/19 2134 03/18/19 0627  GLUCAP 178* 216* 127* 168* 150*    CBC: Recent Labs  Lab 03/12/19 0208 03/12/19 0208 03/13/19 0213 03/13/19 0213 03/14/19 0231 03/15/19 0644 03/16/19 0243 03/17/19 0144 03/18/19 0247  WBC 7.2   < > 8.8   < > 9.2 9.8 12.3* 15.2* 14.6*  NEUTROABS 5.7  --  7.5  --   --   --   --   --   --   HGB 9.5*   < > 9.6*   < > 9.6* 9.6* 9.2* 9.1* 9.2*  HCT 27.6*   < > 27.8*   < > 28.0* 28.5* 27.4* 26.7* 27.3*  MCV 80.0   < > 79.7*   < > 80.7 82.8 81.3 81.2 82.0  PLT 141*   < > 144*   < > 128* 150 184 196 214   < > = values in this interval not displayed.    Basic Metabolic Panel: Recent Labs  Lab 03/14/19 0231 03/15/19 0644 03/16/19 0243 03/17/19 0144 03/18/19 0247  NA 131* 130* 131* 130* 131*  K 3.9 4.3 3.9 4.1 3.7  CL 95* 94* 94* 95* 94*  CO2 _0 21* 23  GLUCOSE 173* 127* 125* 210* 162*  BUN 65* 74* 42* 29* 26*  CREATININE 3.18* 3.39* 2.80* 2.69* 2.72*  CALCIUM 7.4* 7.5* 7.8* 7.9* 8.4*  PHOS 4.8* 5.9* 4.7* 4.6 4.2     Liver Function Tests: Recent Labs  Lab 03/14/19 0231 03/15/19 0644 03/16/19 0243 03/17/19 0144 03/18/19 0247  ALBUMIN 2.4* 2.3* 2.2* 2.5* 2.5*        DVT prophylaxis: Coumadin  Code Status: Full code  Family Communication: No family at bedside  Disposition Plan: likely home when medically ready for discharge     Scheduled medications:  . Chlorhexidine Gluconate Cloth  6 each Topical Q0600  . insulin aspart  0-9 Units Subcutaneous TID WC  . lidocaine (PF)  5 mL Other Once  . lidocaine  10 mL Intradermal Once  . midodrine  10 mg Oral TID WC  . polyethylene glycol  17 g Oral Daily  . sodium chloride flush  3 mL Intravenous Q12H  . sodium chloride flush  3 mL Intravenous Q12H  . vitamin B-12  100 mcg Oral Daily     Consultants:  Cardiology  Nephrology  Procedures:  Echocardiogram  Antibiotics:   Anti-infectives (From admission, onward)   None       Objective   Vitals:   03/18/19 0835 03/18/19 0951 03/18/19 1000 03/18/19 1100  BP:  112/63 112/63 (!) 102/57  Pulse: 60 61 60 60  Resp: 20 (!) 36 (!) 26 (!) 23  Temp:      TempSrc:      SpO2:  (!) 85%  99%  Weight:      Height:        Intake/Output Summary (Last 24 hours) at 03/18/2019 1214 Last data filed at 03/18/2019 0300 Gross per 24 hour  Intake 551.05 ml  Output 4000 ml  Net -3448.95 ml    01/19 1901 - 01/21 0700 In: 2118.4 [P.O.:1280; I.V.:470.7] Out: 4000   Filed Weights   03/17/19 0531 03/17/19 1230 03/17/19 1549  Weight:  84 kg 85 kg 80.9 kg    Physical Examination:   General-appears in no acute distress Heart-S1-S2, regular, no murmur auscultated Lungs-decreased breath sounds bilaterally Abdomen-soft, nontender, no organomegaly Extremities-bilateral 2+ pitting edema of the lower extremities Neuro-alert, oriented x3, no focal deficit noted   Data Reviewed:   Recent Results (from the past 240 hour(s))  MRSA PCR Screening     Status: None   Collection Time: 03/08/19 10:26 PM   Specimen: Nasopharyngeal  Result Value Ref Range Status   MRSA by PCR NEGATIVE NEGATIVE Final    Comment:        The GeneXpert MRSA Assay (FDA approved for NASAL specimens only), is one component of a comprehensive MRSA colonization surveillance program. It is not intended to diagnose MRSA infection nor to guide or monitor treatment for MRSA infections. Performed at [facility], [place] [address] [place], [place] [address]       BNP (last 3 results) Recent Labs    03/07/19 2101  BNP 1,072.0*    Studies:  DG CHEST PORT 1 VIEW  Result Date: 03/18/2019 CLINICAL DATA:  Shortness of breath EXAM: PORTABLE CHEST 1 VIEW COMPARISON:  03/17/2019 FINDINGS: Right-sided IJ approach central venous catheter with  distal tip terminating at the level of the superior cavoatrial junction. Stable positioning of left-sided implanted cardiac device. Stable cardiomegaly. Calcified thoracic aorta. No new focal airspace consolidation, pleural effusion, or pneumothorax. IMPRESSION: Stable exam.  No active disease. Electronically Signed   By: Davina Poke D.O.   On: 03/18/2019 10:19   DG CHEST PORT 1 VIEW  Result Date: 03/17/2019 CLINICAL DATA:  CHF EXAM: PORTABLE CHEST 1 VIEW COMPARISON:  03/07/2019 FINDINGS: Cardiac enlargement with AICD. Negative for heart failure. Atherosclerotic aortic arch. Lungs are clear without infiltrate or effusion. IMPRESSION: No active disease. Electronically Signed   By: Franchot Gallo M.D.   On: 03/17/2019 10:58     Admission status: Inpatient: Based on patients clinical presentation and evaluation of above clinical data, I have made determination that patient meets Inpatient criteria at this time.   Oswald Hillock   [facility] If 7PM-7AM, please contact night-coverage at [email or web address], Office  [phone]  password [email or web address]  03/18/2019, 12:14 PM  LOS: 10 days

## 2019-03-19 ENCOUNTER — Inpatient Hospital Stay (HOSPITAL_COMMUNITY): Payer: Medicaid Other

## 2019-03-19 ENCOUNTER — Encounter (HOSPITAL_COMMUNITY): Payer: Self-pay | Admitting: Hematology

## 2019-03-19 DIAGNOSIS — R609 Edema, unspecified: Secondary | ICD-10-CM

## 2019-03-19 LAB — RENAL FUNCTION PANEL
Albumin: 2.8 g/dL — ABNORMAL LOW (ref 3.5–5.0)
Albumin: 2.4 g/dL — ABNORMAL LOW (ref 3.5–5.0)
Anion gap: 15 (ref 5–15)
Anion gap: 13 (ref 5–15)
BUN: 26 mg/dL — ABNORMAL HIGH (ref 6–20)
BUN: 26 mg/dL — ABNORMAL HIGH (ref 6–20)
CO2: 23 mmol/L (ref 22–32)
CO2: 22 mmol/L (ref 22–32)
Calcium: 8.8 mg/dL — ABNORMAL LOW (ref 8.9–10.3)
Calcium: 8.1 mg/dL — ABNORMAL LOW (ref 8.9–10.3)
Chloride: 96 mmol/L — ABNORMAL LOW (ref 98–111)
Chloride: 97 mmol/L — ABNORMAL LOW (ref 98–111)
Creatinine, Ser: 2.13 mg/dL — ABNORMAL HIGH (ref 0.44–1.00)
Creatinine, Ser: 2.04 mg/dL — ABNORMAL HIGH (ref 0.44–1.00)
GFR calc Af Amer: 29 mL/min — ABNORMAL LOW (ref >60)
GFR calc Af Amer: 30 mL/min — ABNORMAL LOW (ref >60)
GFR calc non Af Amer: 25 mL/min — ABNORMAL LOW (ref >60)
GFR calc non Af Amer: 26 mL/min — ABNORMAL LOW (ref >60)
Glucose, Bld: 180 mg/dL — ABNORMAL HIGH (ref 70–99)
Glucose, Bld: 240 mg/dL — ABNORMAL HIGH (ref 70–99)
Phosphorus: 3.4 mg/dL (ref 2.5–4.6)
Phosphorus: 3.1 mg/dL (ref 2.5–4.6)
Potassium: 4.4 mmol/L (ref 3.5–5.1)
Potassium: 4.2 mmol/L (ref 3.5–5.1)
Sodium: 134 mmol/L — ABNORMAL LOW (ref 135–145)
Sodium: 132 mmol/L — ABNORMAL LOW (ref 135–145)

## 2019-03-19 LAB — GLUCOSE, CAPILLARY
Glucose-Capillary: 149 mg/dL — ABNORMAL HIGH (ref 70–99)
Glucose-Capillary: 231 mg/dL — ABNORMAL HIGH (ref 70–99)
Glucose-Capillary: 257 mg/dL — ABNORMAL HIGH (ref 70–99)
Glucose-Capillary: 156 mg/dL — ABNORMAL HIGH (ref 70–99)

## 2019-03-19 LAB — CBC
HCT: 31.3 % — ABNORMAL LOW (ref 36.0–46.0)
Hemoglobin: 10.3 g/dL — ABNORMAL LOW (ref 12.0–15.0)
MCH: 27.7 pg (ref 26.0–34.0)
MCHC: 32.9 g/dL (ref 30.0–36.0)
MCV: 84.1 fL (ref 80.0–100.0)
Platelets: 235 10*3/uL (ref 150–400)
RBC: 3.72 MIL/uL — ABNORMAL LOW (ref 3.87–5.11)
RDW: 17.6 % — ABNORMAL HIGH (ref 11.5–15.5)
WBC: 14.6 10*3/uL — ABNORMAL HIGH (ref 4.0–10.5)
nRBC: 0.3 % — ABNORMAL HIGH (ref 0.0–0.2)

## 2019-03-19 LAB — SURGICAL PATHOLOGY

## 2019-03-19 LAB — PROTIME-INR
INR: 1.7 — ABNORMAL HIGH (ref 0.8–1.2)
Prothrombin Time: 20.1 seconds — ABNORMAL HIGH (ref 11.4–15.2)

## 2019-03-19 LAB — HEPARIN LEVEL (UNFRACTIONATED): Heparin Unfractionated: 0.56 IU/mL (ref 0.30–0.70)

## 2019-03-19 LAB — MAGNESIUM: Magnesium: 2.3 mg/dL (ref 1.7–2.4)

## 2019-03-19 MED ORDER — GABAPENTIN 300 MG PO CAPS
300.0000 mg | ORAL_CAPSULE | Freq: Every day | ORAL | Status: DC
  Administered 2019-03-19 – 2019-03-20 (×2): 300 mg via ORAL
  Filled 2019-03-19 (×2): qty 1

## 2019-03-19 MED ORDER — DOCUSATE SODIUM 50 MG/5ML PO LIQD
100.0000 mg | Freq: Once | ORAL | Status: AC
  Administered 2019-03-19: 100 mg via ORAL
  Filled 2019-03-19: qty 10

## 2019-03-19 NOTE — Progress Notes (Signed)
Subjective:    Worsened hypoxia yesterday, transferred to ICU for BiPAP and CRRT  As tolerated CRRT, all 4K bath, UF up to 200 mL/h net negative, not using heparin in the circuit, no clotting  Pathology from punch biopsy not yet back  Continues to require high flow nasal cannula or BiPAP  No UOP  K4.4, P3.4  CVP remains elevated   Objective Vital signs in last 24 hours: Vitals:   03/19/19 1100 03/19/19 1151 03/19/19 1200 03/19/19 1300  BP: (!) 90/48  (!) 82/56 (!) 105/55  Pulse: 60  85 60  Resp: (!) 37  (!) 35 (!) 29  Temp:  (!) 97.4 F (36.3 C)    TempSrc:  Oral    SpO2: (!) 72%  (!) 89% (!) 86%  Weight:      Height:       Weight change: -5 kg  Intake/Output Summary (Last 24 hours) at 03/19/2019 1317 Last data filed at 03/19/2019 1300 Gross per 24 hour  Intake 699.38 ml  Output 4142 ml  Net -3442.62 ml    Assessment/ Plan: Pt is a 59 y.o. yo female with nonischemic cardiomyopathy/MGUS/DM and baseline CKD (crt 3's)  who was admitted on 03/07/2019 with massive volume overload and crt 5  Assessment/Plan: 1. Renal-  A on CRF with cardiorenal syndrome being the most recent insult.  Is ESRD.  Remains hypervolemic and transitioned to ICU to initiate CRRT on 1/21.  Tolerating therapy well, will probably continue throughout the weekend before transitioning back to intermittent hemodialysis which she had done well with.  Phosphorus and potassium are stable at the current time. 2. Vol/htn-i as above, diuretics have been stopped, she failed to respond 3. Anemia-multifactorial including #1, myeloma, hematology following.  ESA dosing at their discretion  4. 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  Biopsy done 03/16/18 with  [facility], path pending.  If confirms calciphylaxis  would also offer her thiosulfate..  I would not resume warfarin moving forward but could receive apixaban.  Pain control per primary. 5. Hyperkalemia-  Resolved    6. Hyponatremia-  Likely hypervolemic hyponatremia-  Improving as volume does  Rexene Agent    Labs: Basic Metabolic Panel: Recent Labs  Lab 03/18/19 0247 03/18/19 1030 03/18/19 1327 03/18/19 1822 03/19/19 0513  NA 131*   < > 132* 130* 134*  K 3.7   < > 3.9 4.2 4.4  CL 94*  --   --  94* 96*  CO2 23  --   --  20* 23  GLUCOSE 162*  --   --  263* 180*  BUN 26*  --   --  30* 26*  CREATININE 2.72*  --   --  2.70* 2.13*  CALCIUM 8.4*  --   --  8.5* 8.8*  PHOS 4.2  --   --  4.2 3.4   < > = values in this interval not displayed.   Liver Function Tests: Recent Labs  Lab 03/18/19 0247 03/18/19 1822 03/19/19 0513  ALBUMIN 2.5* 2.7* 2.8*   No results for input(s): LIPASE, AMYLASE in the last 168 hours. No results for input(s): AMMONIA in the last 168 hours. CBC: Recent Labs  Lab 03/13/19 5643 03/14/19 0231 03/15/19 3295 03/15/19 1884 03/16/19 0243 03/16/19 0243 03/17/19 0144 03/17/19 0144 03/18/19 0247 03/18/19 0247 03/18/19  1030 03/18/19 1327 03/19/19 0513  WBC 8.8   < > 9.8   < > 12.3*   < > 15.2*  --  14.6*  --   --   --  14.6*  NEUTROABS 7.5  --   --   --   --   --   --   --   --   --   --   --   --   HGB 9.6*   < > 9.6*   < > 9.2*   < > 9.1*   < > 9.2*   < > 10.9* 10.9* 10.3*  HCT 27.8*   < > 28.5*   < > 27.4*   < > 26.7*   < > 27.3*   < > 32.0* 32.0* 31.3*  MCV 79.7*   < > 82.8  --  81.3  --  81.2  --  82.0  --   --   --  84.1  PLT 144*   < > 150   < > 184   < > 196  --  214  --   --   --  235   < > = values in this interval not displayed.   Cardiac Enzymes: No results for input(s): CKTOTAL, CKMB, CKMBINDEX, TROPONINI in the last 168 hours. CBG: Recent Labs  Lab 03/18/19 0627 03/18/19 1607 03/18/19 2143 03/19/19 0641 03/19/19 1149  GLUCAP 150* 178* 219* 149* 231*    Iron Studies:  No  results for input(s): IRON, TIBC, TRANSFERRIN, FERRITIN in the last 72 hours. Studies/Results: CT CHEST WO CONTRAST  Result Date: 03/19/2019 CLINICAL DATA:  Hypoxemia and dyspnea. EXAM: CT CHEST WITHOUT CONTRAST TECHNIQUE: Multidetector CT imaging of the chest was performed following the standard protocol without IV contrast. COMPARISON:  Chest radiograph from one day prior. FINDINGS: Cardiovascular: Moderate cardiomegaly. No significant pericardial effusion/thickening. Left anterior descending coronary atherosclerosis. 3 lead left subclavian ICD is noted with lead tips in coronary sinus and right ventricle. Right internal jugular central venous catheter terminates at the cavoatrial junction. Atherosclerotic thoracic aorta with ectatic 4.4 cm ascending thoracic aorta. Dilated main pulmonary artery (3.8 cm diameter). Mediastinum/Nodes: No discrete thyroid nodules. Unremarkable esophagus. No axillary adenopathy. Enlarged 1.4 cm left prevascular node (series 3/image 43). No additional pathologically enlarged mediastinal nodes. No discrete hilar adenopathy on these noncontrast images. Lungs/Pleura: No pneumothorax. No pleural effusion. Mild platelike scarring versus atelectasis in posterior left lower lobe. No acute consolidative airspace disease, lung masses or significant pulmonary nodules. Upper abdomen: Finely irregular liver surface, cannot exclude cirrhosis. Simple exophytic 1.5 cm upper left renal cyst. Musculoskeletal: No aggressive appearing focal osseous lesions. Moderate thoracic spondylosis. IMPRESSION: 1. Moderate cardiomegaly.  One vessel coronary atherosclerosis. 2. Dilated main pulmonary artery, suggesting pulmonary arterial hypertension. 3. Mild platelike scarring versus atelectasis at the left lung base. Otherwise no active pulmonary disease. No pleural effusions. 4. Nonspecific mild left prevascular mediastinal lymphadenopathy. 5. Ectatic 4.4 cm ascending thoracic aorta. Recommend annual imaging  followup by CTA or MRA. This recommendation follows 2010 ACCF/AHA/AATS/ACR/ASA/SCA/SCAI/SIR/STS/SVM Guidelines for the Diagnosis and Management of Patients with Thoracic Aortic Disease. Circulation. 2010; 121: B716-R678. Aortic aneurysm NOS (ICD10-I71.9). 6. Finely irregular liver surface, cannot exclude cirrhosis. Aortic Atherosclerosis (ICD10-I70.0). Electronically Signed   By: Ilona Sorrel M.D.   On: 03/19/2019 09:05   DG CHEST PORT 1 VIEW  Result Date: 03/18/2019 CLINICAL DATA:  Shortness of breath EXAM: PORTABLE CHEST 1 VIEW COMPARISON:  03/17/2019 FINDINGS: Right-sided IJ approach central venous catheter with distal  tip terminating at the level of the superior cavoatrial junction. Stable positioning of left-sided implanted cardiac device. Stable cardiomegaly. Calcified thoracic aorta. No new focal airspace consolidation, pleural effusion, or pneumothorax. IMPRESSION: Stable exam.  No active disease. Electronically Signed   By: Davina Poke D.O.   On: 03/18/2019 10:19   Medications: Infusions: .  prismasol BGK 4/2.5 800 mL/hr at 03/18/19 2342  .  prismasol BGK 4/2.5 200 mL/hr at 03/19/19 0601  . sodium chloride 10 mL/hr at 03/14/19 1615  . heparin 1,500 Units/hr (03/19/19 1300)  . prismasol BGK 4/2.5 1,000 mL/hr at 03/19/19 7322    Scheduled Medications: . Chlorhexidine Gluconate Cloth  6 each Topical Q0600  . gabapentin  300 mg Oral Daily  . insulin aspart  0-9 Units Subcutaneous TID WC  . lidocaine (PF)  5 mL Other Once  . lidocaine  10 mL Intradermal Once  . midodrine  10 mg Oral TID WC  . polyethylene glycol  17 g Oral Daily  . sodium chloride flush  3 mL Intravenous Q12H  . sodium chloride flush  3 mL Intravenous Q12H  . vitamin B-12  100 mcg Oral Daily    have reviewed scheduled and prn medications.  Physical Exam: General: alert, pleasan  Heart: RRR Lungs: dec BS at bases Abdomen: soft, non tender Extremities: 2+ pitting edema throughout the legs, significant pitting  in the flanks    03/19/2019,1:17 PM  LOS: 11 days

## 2019-03-19 NOTE — Progress Notes (Signed)
Patient has been accepted for OP HD treatment at [facility] on a TTS schedule with a seat time of 11:45am. She needs to arrive to her appointments at 11:30am. On her first day of treatment, she needs to arrive at 10:45am to sign consent forms (signing on Friday prior to a Saturday start is ideal).  Renal Navigator notes that patient remains in ICU today. Navigator has notified Nephrologist/Dr. Joelyn Oms of patient's acceptance and seat time, but will follow up with patient once out of ICU/closer to discharge to discuss OP HD treatment schedule. Renal Navigator will continue to follow throughout hospitalization to assist with smooth transition from hospital to OP HD clinic and will notify clinic of patient's discharge/start date.  Alphonzo Cruise, [place] [facility] [phone]

## 2019-03-19 NOTE — Progress Notes (Signed)
PT Cancellation Note  Patient Details Name: [name] MRN: [id number] DOB: Jun 28, 1960   Cancelled Treatment:    Reason Eval/Treat Not Completed: Patient declined, no reason specified. PT attempted to perform evaluation with patient however patient declining due to significant pain in LE during any attempts at mobility. Pt requests PT return at a later time. PT will attempt to follow up as time allows.   Zenaida Niece 03/19/2019, 3:02 PM

## 2019-03-19 NOTE — Progress Notes (Addendum)
Patient ID: [name], female   DOB: 05/17/1960, 59 y.o.   MRN: [id number]     Advanced Heart Failure Rounding Note  PCP-Cardiologist: Kate Sable, MD   Subjective:    Respiratory status improved. Off BiPAP but remains on high flow [place], 13L/min.   CVP remains high but slightly improved from day prior, now at 20. Getting continuous CVVH, 3.6L removed yesterday.  BP stable. SCr improved, down from 2.7>>2.1.  Overall feels a bit better today but complaining of left breast tenderness.   New diagnosis of multiple myeloma. 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   Echo: EF < 20%, no LVH, mildly reduced RV systolic function, severe biatrial enlargement, severe TR.  RHC Procedural Findings (dobutamine 2.5 mcg/kg/min): Hemodynamics (mmHg) RA 18 RV 43/18 PA 45/19, mean 28 PCWP mean 19 Oxygen saturations: PA 63% AO 94% Cardiac Output (Fick) 6.1  Cardiac Index (Fick) 3.06 PVR 1.5 WU PAPI 1.4   Objective:   Weight Range: 80 kg Body mass index is 28.47 kg/m.   Vital Signs:   Temp:  [97.2 F (36.2 C)-98.4 F (36.9 C)] 97.2 F (36.2 C) (01/22 0400) Pulse Rate:  [44-156] 59 (01/22 0618) Resp:  [14-36] 20 (01/22 0618) BP: (66-112)/(41-79) 107/61 (01/22 0618) SpO2:  [85 %-100 %] 87 % (01/22 0618) FiO2 (%):  [100 %] 100 % (01/22 0345) Weight:  [80 kg] 80 kg (01/22 0500) Last BM Date: 03/17/19  Weight change: Filed Weights   03/17/19 1230 03/17/19 1549 03/19/19 0500  Weight: 85 kg 80.9 kg 80 kg    Intake/Output:   Intake/Output Summary (Last 24 hours) at 03/19/2019 9211 Last data filed at 03/19/2019 0700 Gross per 24 hour  Intake 729.93 ml  Output 3696 ml  Net -2966.07 ml      Physical Exam  CVP 20 General: A&Ox3. No distress HEENT: normal Neck: supple. JVP to ear . Carotids 2+ bilat; no bruits. No lymphadenopathy or thryomegaly appreciated. Cor: PMI nondisplaced. Regular rate & rhythm. No rubs, gallops or murmurs. Lungs: Decreased in the bases.   Abdomen: soft, nontender, nondistended. No hepatosplenomegaly. No bruits or masses. Good bowel sounds. Extremities: no cyanosis, clubbing, rash. R and LLE   1+ bilateral LE Edema.  Neuro: alert & orientedx3, cranial nerves grossly intact. moves all 4 extremities w/o difficulty.     Telemetry   A fib BiV pacing 60-70s   Labs    CBC Recent Labs    03/18/19 0247 03/18/19 1030 03/18/19 1327 03/19/19 0513  WBC 14.6*  --   --  14.6*  HGB 9.2*   < > 10.9* 10.3*  HCT 27.3*   < > 32.0* 31.3*  MCV 82.0  --   --  84.1  PLT 214  --   --  235   < > = values in this interval not displayed.   Basic Metabolic Panel Recent Labs    03/18/19 1822 03/19/19 0513  NA 130* 134*  K 4.2 4.4  CL 94* 96*  CO2 20* 23  GLUCOSE 263* 180*  BUN 30* 26*  CREATININE 2.70* 2.13*  CALCIUM 8.5* 8.8*  MG  --  2.3  PHOS 4.2 3.4   Liver Function Tests Recent Labs    03/18/19 1822 03/19/19 0513  ALBUMIN 2.7* 2.8*   No results for input(s): LIPASE, AMYLASE in the last 72 hours. Cardiac Enzymes No results for input(s): CKTOTAL, CKMB, CKMBINDEX, TROPONINI in the last 72 hours.  BNP: BNP (last 3 results) Recent Labs    03/07/19  2101  BNP 1,072.0*    ProBNP (last 3 results) No results for input(s): PROBNP in the last 8760 hours.   D-Dimer No results for input(s): DDIMER in the last 72 hours. Hemoglobin A1C No results for input(s): HGBA1C in the last 72 hours. Fasting Lipid Panel No results for input(s): CHOL, HDL, LDLCALC, TRIG, CHOLHDL, LDLDIRECT in the last 72 hours. Thyroid Function Tests No results for input(s): TSH, T4TOTAL, T3FREE, THYROIDAB in the last 72 hours.  Invalid input(s): FREET3  Other results:   Imaging    DG CHEST PORT 1 VIEW  Result Date: 03/18/2019 CLINICAL DATA:  Shortness of breath EXAM: PORTABLE CHEST 1 VIEW COMPARISON:  03/17/2019 FINDINGS: Right-sided IJ approach central venous catheter with distal tip terminating at the level of the superior  cavoatrial junction. Stable positioning of left-sided implanted cardiac device. Stable cardiomegaly. Calcified thoracic aorta. No new focal airspace consolidation, pleural effusion, or pneumothorax. IMPRESSION: Stable exam.  No active disease. Electronically Signed   By: Davina Poke D.O.   On: 03/18/2019 10:19     Medications:     Scheduled Medications: . Chlorhexidine Gluconate Cloth  6 each Topical Q0600  . insulin aspart  0-9 Units Subcutaneous TID WC  . lidocaine (PF)  5 mL Other Once  . lidocaine  10 mL Intradermal Once  . midodrine  10 mg Oral TID WC  . polyethylene glycol  17 g Oral Daily  . sodium chloride flush  3 mL Intravenous Q12H  . sodium chloride flush  3 mL Intravenous Q12H  . vitamin B-12  100 mcg Oral Daily    Infusions: .  prismasol BGK 4/2.5 800 mL/hr at 03/18/19 2342  .  prismasol BGK 4/2.5 200 mL/hr at 03/19/19 0601  . sodium chloride 10 mL/hr at 03/14/19 1615  . heparin 1,500 Units/hr (03/19/19 0500)  . prismasol BGK 4/2.5 1,000 mL/hr at 03/19/19 0254    PRN Medications: sodium chloride, acetaminophen **OR** acetaminophen, fentaNYL (SUBLIMAZE) injection, heparin, heparin, lidocaine, ondansetron **OR** ondansetron (ZOFRAN) IV, oxyCODONE, sodium chloride, sodium chloride, sodium chloride flush, traMADol    Assessment/Plan   1. Acute Hypoxic Respiratory Failure - 2/2 volume overload. Improved today. Off bipap and now on high flow [place], 13L/min - CVP remains elevated at 20. Continue continuous CVVHD for volume removal.   2. Acute on chronic systolic CHF: Longstanding NICM.  Last echo in 2/19 with EF 15-20%. She has a [employer] CRT-D device. Admitted with progressive dyspnea and 30 lb weight gain. Empirically started on dobutamine 2.5 due to concern for low output. RHC showed R>L heart failure with low PAPI adequate cardiac output on dobutamine.  She has started iHD, tolerates well.  Now off dobutamine.  Still volume overloaded with significant oxygen  requirement. Minimal response to Lasix/metolazone.  - CVP slightly improved but remains elevated at 20 (22-23 yesterday).  - Continue continuous CVVHD per nephrology. -3.6L removed yesterday. Scr improved, 2.7>>2.1 - Continue midodrine 10 mg tid with relative hypotension.  - Stopped digoxin and benazepril with AKI.  - Stopped beta blocker with concern for low output.  - No BP room for hydralazine/nitrates.  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  2. AKI on CKD stage 4: Baseline creatinine around 3, went up to 5 with BUN 150 this admission. Suspect cardiorenal syndrome superimposed on possible diabetic nephropathy. Suspect she will be HD dependent.  - CVP 20. Continue CVVHD today.  - SCr improving, 2.7>>2.1 -Nephrology appreciated.  3. Chronic atrial  fibrillation: Warfarin held for procedures, now on heparin gtt. Long-term, will transition to apixaban with some concern for warfarin skin necrosis.    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  5. DM: Per primary.  6. Monoclonal gammopathy:  LV morphology does not look consistent with AL amyloidosis. She has been seen by hematology, has had bone marrow bx showing 14% plasma cells by flow cytometry on the aspirate which is by definition multiple myeloma. Seen by heme/onc, plan for outpatient treatment.  7. Tricuspid regurgitation: Severe by echo.  8  Subcutaneous nodules: ?calciphylaxis versus less likely warfarin skin necrosis.  - Off warfarin, will use Eliquis in future.  - Surgery consulted for biopsy.  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   Length of Stay: [address] PA-C  03/19/2019, 7:12 AM  Advanced Heart Failure Team Pager [phone] (M-F; [place])  Please contact [facility] for night-coverage after hours (4p -7a ) and weekends on [email or web address]  Patient seen with PA, agree with the above note.   I/O net negative 3 L with CVVH, CVP down to about 20 today.  Still  requiring 13 L oxygen.  CXR yesterday unremarkable.  BP stable with CVVH.  She is breathing comfortably.   General: NAD Neck: JVP 14+ cm, no thyromegaly or thyroid nodule.  Lungs: Clear to auscultation bilaterally with normal respiratory effort. CV: Lateral PMI.  Heart regular S1/S2, no S3/S4, 2/6 HSM LLSB. 1+ ankle edema.    Abdomen: Soft, nontender, no hepatosplenomegaly, no distention.  Skin: Intact without lesions or rashes.  Neurologic: Alert and oriented x 3.  Psych: Normal affect. Extremities: No clubbing or cyanosis.  HEENT: Normal.   Oxygen requirement still considerable given relatively clear CXR.  Think PE unlikely with ongoing heparin anticoagulation. Afebrile.  CVP still 20, so would proceed with further fluid removal via CVVH.  - Continue current UF, tolerating well so far.  - CT chest w/o contrast - Lower extremity US to r/o DVT (unlikely).   Pending pathology of subcutaneous biopsies for possible calciphylaxis.   Plan to use Eliquis eventually rather warfarin eventually.   CRITICAL CARE Performed by: Loralie Champagne  Total critical care time: 35 minutes  Critical care time was exclusive of separately billable procedures and treating other patients.  Critical care was necessary to treat or prevent imminent or life-threatening deterioration.  Critical care was time spent personally by me on the following activities: development of treatment plan with patient and/or surrogate as well as nursing, discussions with consultants, evaluation of patient's response to treatment, examination of patient, obtaining history from patient or surrogate, ordering and performing treatments and interventions, ordering and review of laboratory studies, ordering and review of radiographic studies, pulse oximetry and re-evaluation of patient's condition.  Loralie Champagne 03/19/2019 7:56 AM

## 2019-03-19 NOTE — Progress Notes (Signed)
In to check on patient, patient currently on 13 Lpm [place] HFNC. Vitals stable. Patient stated she didn't want to be placed on the BiPAP at this time. RN aware.

## 2019-03-19 NOTE — Progress Notes (Addendum)
PULMONARY / CRITICAL CARE MEDICINE   NAME:  [name], MRN:  [id number], DOB:  [birth year], LOS: 97 ADMISSION DATE:  03/07/2019, CONSULTATION DATE:   REFERRING MD:  Dr. 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   HISTORY OF PRESENT ILLNESS    [name] neck is a 59 year old female with chronic A. fib, hepatic cirrhosis, acute on chronic systolic heart failure ef 15-20% with ic, acute on chronic renal failure, thrombocytopenia, congenital third-degree AV block with biventricular ICD which was upgraded 5 years ago, diabetes mellitus type 2, and gout who was admitted on 03/07/2019 for increased swelling in his legs and abdomen, having gained 3 kg since 03/03/2019, orthopnea, PND, productive cough.  Patient became hypoxic earlier today down to 50% and tachypnea. She required increased oxygen upto 15L via HFNC and NRB and subsequently placed on bipap. CVP was 22. Patient transferred to ICU for management of acute hypoxic   SIGNIFICANT PAST MEDICAL HISTORY   Automatic implantable cardioverter-defibrillator in situ, Chronic anticoagulation, Chronic atrial fibrillation (HCC), Chronic kidney disease, Chronic systolic CHF (congestive heart failure) ([place]), Congenital third degree heart block, Dizziness and giddiness (05/19/2012), Dysphagia (08/14/2011), GERD (gastroesophageal reflux disease), Helicobacter pylori gastritis (08/14/2011), IDDM (insulin dependent diabetes mellitus), Kidney stones (1990's), Nonischemic cardiomyopathy ([place]), Potassium (K) excess, and Stroke ([place]) (1999).  SIGNIFICANT EVENTS:  1/10 admitted to [name] for acute on chronic heart failure exacerbation  1/20 punch biopsy done   1/21 transferred to icu for acute hypoxic respiratory failure, placed on bipap  STUDIES:   TTE 1/12  1. Left ventricular ejection fraction, by visual estimation, is <20%. The left ventricle has severely decreased function. There is no left ventricular hypertrophy.  2. Definity contrast agent was given IV to delineate the left ventricular endocardial borders.  3. Abnormal septal motion consistent with left bundle branch block.  4. Left ventricular diastolic parameters are consistent with Grade III diastolic dysfunction (restrictive).  5. Moderately dilated left ventricular internal cavity size.  6. The left ventricle demonstrates global hypokinesis.  7. Global right ventricle has mildly reduced systolic function.The right ventricular size is severely enlarged. No increase in right ventricular wall thickness.  8. Left atrial size was severely dilated.  9. Right atrial size was severely dilated. 10. Trivial pericardial effusion is present. 11. The mitral valve is normal in structure. No evidence of mitral valve regurgitation. 12. The tricuspid valve is normal in structure. 13. The aortic valve is normal in structure. Aortic valve regurgitation is not visualized. Mild aortic valve sclerosis without stenosis. 14. Pulmonic regurgitation is moderate. 15. The pulmonic valve was normal in structure. Pulmonic valve regurgitation is moderate. 16. Mildly dilated pulmonary artery. 17. Mildly elevated pulmonary artery systolic pressure. 18. The tricuspid regurgitant velocity is 2.32 m/s, and with an assumed right atrial pressure of 15 mmHg, the estimated right ventricular systolic pressure is mildly elevated at 36.6 mmHg. 19. A pacer wire is visualized. 20. The inferior vena cava is dilated in size with <50% respiratory variability, suggesting right atrial pressure of 15 mmHg. 21. No intracardiac thrombi or masses were visualized. 22. No significant change from prior study (April 10, 2017). 23.  Prior images reviewed side by side  Right heart cath  03/09/2019 1. Right > left heart failure with low PAPI.  2. Pulmonary venous hypertension.  3. Adequate cardiac output on dobutamine 2.5 mcg/kg/min  CULTURES:   MRSA PCR 1/11 negative  Respiratory viral panel 1/10 neg covid, neg flu a and b   ANTIBIOTICS:    LINES/TUBES:  Peripheral IV right hand 1/15 Peripheral Iv left forearm 1/17  CONSULTANTS:  Nephrology Heart Failure  Surgery  SUBJECTIVE:  Patient states that she has severe pain in her bilateral lower extremities and abdomen. Husband who is a Theme park manager at [employer] is at bedside.   CONSTITUTIONAL: BP 107/61   Pulse (!) 59   Temp (!) 97.2 F (36.2 C) (Axillary)   Resp 20   Ht 5' 6"  (1.676 m)   Wt 80 kg   SpO2 (!) 87%   BMI 28.47 kg/m   I/O last 3 completed shifts: In: 1256.7 [P.O.:660; I.V.:530.7; IV Piggyback:66] Out: 1937 [Other:5364]  CVP:  [9 mmHg-36 mmHg] 30 mmHg  FiO2 (%):  [100 %] 100 %  PHYSICAL EXAM: General:  Appears comfortable Neuro:  Alert, oriented, able to converse with physician HEENT:  hfnc at 13L Cardiovascular:  Rrr, s1 and s2 audible Lungs:  Rrr, no rales or rhonchi Abdomen:  Distended, firm Musculoskeletal: edematous lower extremities notably in bilateral feet  Skin:  Warm extremties    ASSESSMENT AND PLAN    Acute hypoxemic respiratory failure Acute respiratory alkalosis Thought to be secondary to pulmonary edema. Patient got CVVHD 1/21 which removed 3.6L. She was on bipap for most of the day 1/21.   -Will continue to monitor respiratory needs  -continue CRRT per nephrology  Acute on chronic systolic heart failure Longstanding NICM [employer] CRT-D TTE done 03/09/2019 showed LVEF <90%, grade 3 diastolic dysfunction, severely dilated left atrium and right atria, trivial pericardial effusion, global right ventricular mildly reduced systolic function, mildly dilated pulmonary artery, mild aortic loss sclerosis without  stenosis.  Right heart cath done 1212 showed right >left heart failure with low PA PI, pulmonary venous hypertension, adequate cardiac output on dobutamine. Amyloidosis less likely based on absence of findings on tte.   CVP has improved to 20 from prior 22 on 1/21. Heart failure team ordered CT chest and lower extremitiy doppler to r/o pe and dvt.  -follow cvp -continue midodrine 2m tid  -CVVHD to diurese  -cannot use digoxin, acei/arb due to aki  -cannot use b blocker due to concern for low output  -hypotensive so cannot do nitrates/hydralazine  Leukocytosis  Patient with persistent leukocytosis starting 1/19. 12>15>14>14.6. She is afebrile. Neg for covid, flu a, and flu b. MRSA pcr neg. Chest xray does not show any focal airspace consolidation.   Possibly demarginalization secondary to shock.   Acute on chronic renal failure ESRD Thought to be secondary to diabetes mellitus, cardiorenal syndrome, MGUS.    Patient received CRRT 03/18/19. Cr improved from 2.7>2.13.  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  Protein electrophoresis showing M spike 1.2%, albumin 2.6. Bone marrow biopsy showing 14% plasma cells. Oncology plans to do chemotherapy outpatient.   -appreciate oncology following   Chronic atrial fibrillation  Rates ranging 50-60s.   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 Punch biopsy done by general surgery 1/20.  -fentanyl prn  Abdominal tenderness  Firm abdomen noted on examination. CT abdomen done 1/11 does not show any acute abnormality.   Possibly constipation. Patient has not had bowel movement in 3 days. -mirlax and colace -please document bm -fentanyl 12.61mg q2hrs prn  Best Practice / Goals of  Care / Disposition.   DVT PROPHYLAXIS: scds SUP: n/a NUTRITION: renal diet MOBILITY: bed rest GOALS OF CARE: Full code FAMILY DISCUSSIONS: Updated patient and husband at bedside DISPOSITION ICU  LABS  Glucose Recent Labs  Lab 03/17/19 1726 03/17/19 2134 03/18/19 0627 03/18/19 1607 03/18/19 2143 03/19/19 0641  GLUCAP 127* 168* 150* 178* 219* 149*    BMET Recent Labs  Lab 03/18/19 0247 03/18/19 1030 03/18/19 1327 03/18/19 1822 03/19/19 0513  NA 131*   < > 132* 130* 134*  K 3.7   < > 3.9 4.2 4.4  CL 94*  --   --  94* 96*  CO2 23  --   --  20* 23  BUN 26*  --   --  30* 26*  CREATININE 2.72*  --   --  2.70* 2.13*  GLUCOSE 162*  --   --  263* 180*   < > = values in this interval not displayed.    Liver Enzymes Recent Labs  Lab 03/18/19 0247 03/18/19 1822 03/19/19 0513  ALBUMIN 2.5* 2.7* 2.8*    Electrolytes Recent Labs  Lab 03/18/19 0247 03/18/19 1822 03/19/19 0513  CALCIUM 8.4* 8.5* 8.8*  MG  --   --  2.3  PHOS 4.2 4.2 3.4    CBC Recent Labs  Lab 03/17/19 0144 03/17/19 0144 03/18/19 0247 03/18/19 0247 03/18/19 1030 03/18/19 1327 03/19/19 0513  WBC 15.2*  --  14.6*  --   --   --  14.6*  HGB 9.1*   < > 9.2*   < > 10.9* 10.9* 10.3*  HCT 26.7*   < > 27.3*   < > 32.0* 32.0* 31.3*  PLT 196  --  214  --   --   --  235   < > = values in this interval not displayed.    ABG Recent Labs  Lab 03/18/19 1030 03/18/19 1327  PHART 7.463* 7.431  PCO2ART 29.9* 36.2  PO2ART 52.0* 131.0*    Coag's Recent Labs  Lab 03/17/19 0144 03/18/19 0247 03/19/19 0513  INR 1.8* 1.8* 1.7*    Sepsis Markers No results for input(s): LATICACIDVEN, PROCALCITON, O2SATVEN in the last 168 hours.  Cardiac Enzymes No results for input(s): TROPONINI, PROBNP in the last 168 hours.  REVIEW OF SYSTEMS:    Patient states that breathing has improved from yesterday. Has breast tenderness and lower extremity pain.

## 2019-03-19 NOTE — Progress Notes (Addendum)
Venous duplex lower extremities test  has been completed. Refer to [facility] under chart review to view preliminary results.   03/19/2019  3:05 PM [place] Code, Bonnye Fava

## 2019-03-19 NOTE — Progress Notes (Signed)
ANTICOAGULATION CONSULT NOTE - [place] for heparin Indication: atrial fibrillation  Allergies  Allergen Reactions  . Wheat Swelling  . Latex Rash  . Penicillins Rash  . Sulfa Antibiotics Rash    Patient Measurements: Height: 5\' 6"  (167.6 cm) Weight: 176 lb 5.9 oz (80 kg) IBW/kg (Calculated) : 59.3  Vital Signs: Temp: 97.4 F (36.3 C) (01/22 1151) Temp Source: Oral (01/22 1151) BP: 82/56 (01/22 1200) Pulse Rate: 85 (01/22 1200)  Labs: Recent Labs    03/17/19 0144 03/17/19 0144 03/18/19 0247 03/18/19 0247 03/18/19 1030 03/18/19 1030 03/18/19 1327 03/18/19 1822 03/19/19 0513  HGB 9.1*   < > 9.2*   < > 10.9*   < > 10.9*  --  10.3*  HCT 26.7*   < > 27.3*   < > 32.0*  --  32.0*  --  31.3*  PLT 196  --  214  --   --   --   --   --  235  LABPROT 20.5*  --  20.7*  --   --   --   --   --  20.1*  INR 1.8*  --  1.8*  --   --   --   --   --  1.7*  HEPARINUNFRC 0.32  --  0.36  --   --   --   --   --  0.56  CREATININE 2.69*   < > 2.72*  --   --   --   --  2.70* 2.13*   < > = values in this interval not displayed.    Estimated Creatinine Clearance: 30.3 mL/min (A) (by C-G formula based on SCr of 2.13 mg/dL (H)).   Assessment: 59 yo female with hx AFib, Coumadin on hold, continue heparin bridge.   Heparin level within goal range, no overt bleeding or complications noted. No plans per renal to restart warfarin, may use apixaban.   Goal of Therapy:  Heparin level goal 0.3-0.7 Monitor platelets by anticoagulation protocol: Yes   Plan:  Continue IV heparin at current rate. Daily heparin level and CBC. No current plans for oral anticoagulation  Erin Hearing PharmD., BCPS Clinical Pharmacist 03/19/2019 12:47 PM

## 2019-03-20 LAB — RENAL FUNCTION PANEL
Albumin: 2.6 g/dL — ABNORMAL LOW (ref 3.5–5.0)
Albumin: 2.9 g/dL — ABNORMAL LOW (ref 3.5–5.0)
Anion gap: 13 (ref 5–15)
Anion gap: 16 — ABNORMAL HIGH (ref 5–15)
BUN: 25 mg/dL — ABNORMAL HIGH (ref 6–20)
BUN: 24 mg/dL — ABNORMAL HIGH (ref 6–20)
CO2: 22 mmol/L (ref 22–32)
CO2: 20 mmol/L — ABNORMAL LOW (ref 22–32)
Calcium: 8.8 mg/dL — ABNORMAL LOW (ref 8.9–10.3)
Calcium: 9 mg/dL (ref 8.9–10.3)
Chloride: 98 mmol/L (ref 98–111)
Chloride: 95 mmol/L — ABNORMAL LOW (ref 98–111)
Creatinine, Ser: 1.89 mg/dL — ABNORMAL HIGH (ref 0.44–1.00)
Creatinine, Ser: 1.83 mg/dL — ABNORMAL HIGH (ref 0.44–1.00)
GFR calc Af Amer: 33 mL/min — ABNORMAL LOW (ref >60)
GFR calc Af Amer: 34 mL/min — ABNORMAL LOW (ref >60)
GFR calc non Af Amer: 29 mL/min — ABNORMAL LOW (ref >60)
GFR calc non Af Amer: 30 mL/min — ABNORMAL LOW (ref >60)
Glucose, Bld: 176 mg/dL — ABNORMAL HIGH (ref 70–99)
Glucose, Bld: 214 mg/dL — ABNORMAL HIGH (ref 70–99)
Phosphorus: 2.7 mg/dL (ref 2.5–4.6)
Phosphorus: 2.6 mg/dL (ref 2.5–4.6)
Potassium: 4.6 mmol/L (ref 3.5–5.1)
Potassium: 4.6 mmol/L (ref 3.5–5.1)
Sodium: 133 mmol/L — ABNORMAL LOW (ref 135–145)
Sodium: 131 mmol/L — ABNORMAL LOW (ref 135–145)

## 2019-03-20 LAB — CBC
HCT: 29.6 % — ABNORMAL LOW (ref 36.0–46.0)
Hemoglobin: 9.9 g/dL — ABNORMAL LOW (ref 12.0–15.0)
MCH: 27.7 pg (ref 26.0–34.0)
MCHC: 33.4 g/dL (ref 30.0–36.0)
MCV: 82.9 fL (ref 80.0–100.0)
Platelets: 222 10*3/uL (ref 150–400)
RBC: 3.57 MIL/uL — ABNORMAL LOW (ref 3.87–5.11)
RDW: 18.2 % — ABNORMAL HIGH (ref 11.5–15.5)
WBC: 13.5 10*3/uL — ABNORMAL HIGH (ref 4.0–10.5)
nRBC: 1 % — ABNORMAL HIGH (ref 0.0–0.2)

## 2019-03-20 LAB — GLUCOSE, CAPILLARY
Glucose-Capillary: 161 mg/dL — ABNORMAL HIGH (ref 70–99)
Glucose-Capillary: 278 mg/dL — ABNORMAL HIGH (ref 70–99)
Glucose-Capillary: 172 mg/dL — ABNORMAL HIGH (ref 70–99)
Glucose-Capillary: 175 mg/dL — ABNORMAL HIGH (ref 70–99)

## 2019-03-20 LAB — PROTIME-INR
INR: 2 — ABNORMAL HIGH (ref 0.8–1.2)
Prothrombin Time: 22.6 seconds — ABNORMAL HIGH (ref 11.4–15.2)

## 2019-03-20 LAB — HEPARIN LEVEL (UNFRACTIONATED): Heparin Unfractionated: 0.36 IU/mL (ref 0.30–0.70)

## 2019-03-20 LAB — MAGNESIUM: Magnesium: 2.5 mg/dL — ABNORMAL HIGH (ref 1.7–2.4)

## 2019-03-20 MED ORDER — GABAPENTIN 300 MG PO CAPS
300.0000 mg | ORAL_CAPSULE | Freq: Two times a day (BID) | ORAL | Status: DC
  Administered 2019-03-20 – 2019-03-27 (×13): 300 mg via ORAL
  Filled 2019-03-20 (×14): qty 1

## 2019-03-20 MED ORDER — OXYCODONE HCL 5 MG PO TABS
5.0000 mg | ORAL_TABLET | Freq: Four times a day (QID) | ORAL | Status: DC | PRN
  Administered 2019-03-21 – 2019-03-22 (×6): 5 mg via ORAL
  Filled 2019-03-20 (×6): qty 1

## 2019-03-20 MED ORDER — SENNA 8.6 MG PO TABS
1.0000 | ORAL_TABLET | Freq: Once | ORAL | Status: AC
  Administered 2019-03-20: 8.6 mg via ORAL
  Filled 2019-03-20: qty 1

## 2019-03-20 MED ORDER — DOCUSATE SODIUM 50 MG/5ML PO LIQD
200.0000 mg | Freq: Every day | ORAL | Status: DC
  Administered 2019-03-20 – 2019-03-21 (×2): 200 mg via ORAL
  Filled 2019-03-20 (×3): qty 20

## 2019-03-20 MED ORDER — POLYETHYLENE GLYCOL 3350 17 G PO PACK
17.0000 g | PACK | Freq: Two times a day (BID) | ORAL | Status: DC
  Administered 2019-03-20 – 2019-03-29 (×17): 17 g via ORAL
  Filled 2019-03-20 (×21): qty 1

## 2019-03-20 NOTE — Progress Notes (Signed)
Pt off BIPAP at this time and resting comfortably.

## 2019-03-20 NOTE — Progress Notes (Signed)
PULMONARY / CRITICAL CARE MEDICINE   NAME:  [name], MRN:  [id number], DOB:  12/27/1960, LOS: 12 ADMISSION DATE:  03/07/2019, CONSULTATION DATE:   REFERRING MD:  Dr. 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   HISTORY OF PRESENT ILLNESS    [name] neck is a 59 year old female with chronic A. fib, hepatic cirrhosis, acute on chronic systolic heart failure ef 15-20% with ic, acute on chronic renal failure, thrombocytopenia, congenital third-degree AV block with biventricular ICD which was upgraded 5 years ago, diabetes mellitus type 2, and gout who was admitted on 03/07/2019 for increased swelling in his legs and abdomen, having gained 3 kg since 03/03/2019, orthopnea, PND, productive cough.  Patient became hypoxic earlier today down to 50% and tachypnea. She required increased oxygen upto 15L via HFNC and NRB and subsequently placed on bipap. CVP was 22. Patient transferred to ICU for management of acute hypoxic   SIGNIFICANT PAST MEDICAL HISTORY   Automatic implantable cardioverter-defibrillator in situ, Chronic anticoagulation, Chronic atrial fibrillation (HCC), Chronic kidney disease, Chronic systolic CHF (congestive heart failure) ([place]), Congenital third degree heart block, Dizziness and giddiness (05/19/2012), Dysphagia (08/14/2011), GERD (gastroesophageal reflux disease), Helicobacter pylori gastritis (08/14/2011), IDDM (insulin dependent diabetes mellitus), Kidney stones (1990's), Nonischemic cardiomyopathy ([place]), Potassium (K) excess, and Stroke ([place]) (1999).  SIGNIFICANT EVENTS:  1/10 admitted to [facility] for acute on chronic heart failure exacerbation  1/20 punch biopsy done   1/21 transferred to icu for acute hypoxic respiratory failure, placed on bipap, on crrt 1/22 CT chest without pe, was on 13L HFNC, got bipap intermittently, on crrt  STUDIES:   TTE 1/12  1. Left ventricular ejection fraction, by visual estimation, is <20%. The left ventricle has severely decreased function. There is no left ventricular hypertrophy.  2. Definity contrast agent was given IV to delineate the left ventricular endocardial borders.  3. Abnormal septal motion consistent with left bundle branch block.  4. Left ventricular diastolic parameters are consistent with Grade III diastolic dysfunction (restrictive).  5. Moderately dilated left ventricular internal cavity size.  6. The left ventricle demonstrates global hypokinesis.  7. Global right ventricle has mildly reduced systolic function.The right ventricular size is severely enlarged. No increase in right ventricular wall thickness.  8. Left atrial size was severely dilated.  9. Right atrial size was severely dilated. 10. Trivial pericardial effusion is present. 11. The mitral valve is normal in structure. No evidence of mitral valve regurgitation. 12. The tricuspid valve is normal in structure. 13. The aortic valve is normal in structure. Aortic valve regurgitation is not visualized. Mild aortic valve sclerosis without stenosis. 14. Pulmonic regurgitation is moderate. 15. The pulmonic valve was normal in structure. Pulmonic valve regurgitation is moderate. 16. Mildly dilated pulmonary artery. 17. Mildly elevated pulmonary artery systolic pressure. 18. The tricuspid regurgitant velocity is 2.32 m/s, and with an assumed right atrial pressure of 15 mmHg, the estimated right ventricular systolic pressure is mildly elevated at 36.6 mmHg. 19. A pacer wire is visualized. 20. The inferior vena cava is dilated in size with <50% respiratory variability, suggesting right atrial pressure of 15 mmHg. 21. No intracardiac thrombi or masses were  visualized. 22. No significant change from  prior study (April 10, 2017). 23. Prior images reviewed side by side  Right heart cath 03/09/2019 1. Right > left heart failure with low PAPI.  2. Pulmonary venous hypertension.  3. Adequate cardiac output on dobutamine 2.5 mcg/kg/min  CT chest 1/22 IMPRESSION: 1. Moderate cardiomegaly.  One vessel coronary atherosclerosis. 2. Dilated main pulmonary artery, suggesting pulmonary arterial hypertension. 3. Mild platelike scarring versus atelectasis at the left lung base. Otherwise no active pulmonary disease. No pleural effusions. 4. Nonspecific mild left prevascular mediastinal lymphadenopathy. 5. Ectatic 4.4 cm ascending thoracic aorta. Recommend annual imaging followup by CTA or MRA. This recommendation follows 2010 ACCF/AHA/AATS/ACR/ASA/SCA/SCAI/SIR/STS/SVM Guidelines for the Diagnosis and Management of Patients with Thoracic Aortic Disease. Circulation. 2010; 121: G836-O294. Aortic aneurysm NOS (ICD10-I71.9). 6. Finely irregular liver surface, cannot exclude cirrhosis.  Aortic Atherosclerosis (ICD10-I70.0).  CULTURES:   MRSA PCR 1/11 negative  Respiratory viral panel 1/10 neg covid, neg flu a and b   ANTIBIOTICS:    LINES/TUBES:  Peripheral IV right hand 1/15 Peripheral Iv left forearm 1/17  CONSULTANTS:  Nephrology Heart Failure  Surgery  SUBJECTIVE:  Patient states that she has severe pain in her bilateral lower extremities and abdomen. Husband who is a Theme park manager at [employer] is at bedside.   CONSTITUTIONAL: BP (!) 102/57   Pulse (!) 59   Temp (!) 97.2 F (36.2 C) (Axillary)   Resp (!) 24   Ht 5' 6"  (1.676 m)   Wt 75.3 kg   SpO2 100%   BMI 26.79 kg/m   I/O last 3 completed shifts: In: 1288.4 [P.O.:750; I.V.:538.4] Out: 6779 [Other:6779]  CVP:  [16 mmHg-29 mmHg] 22 mmHg  FiO2 (%):  [80 %] 80 %  PHYSICAL EXAM: General:  Laying in bed Neuro:alert, oriented, able to follow commands HEENT: no conjunctival  injection  Cardiovascular:  Rrr, s1 s2 and s3 audible, jvp 3cm above stenal angle Lungs:  No rales or rhonchi  Abdomen:  Firm, distended Musculoskeletal: no pitting edema Skin:  Warm extremities    ASSESSMENT AND PLAN    Acute hypoxemic respiratory failure Acute respiratory alkalosis Thought to be secondary to pulmonary edema. atient got CVVHD 1/21 which removed 3.6L. She was on bipap for most of the day 1/21. P  -Will continue to monitor respiratory needs  -continue CRRT per nephrology  Acute on chronic systolic heart failure, TM<54% Longstanding NICM [employer] CRT-D 4.4L of fluid removed from patient over the past 24 hrs. Her CVP is 17-20 today.   -follow cvp -continue midodrine 61m tid, consider dose when on dialysis  -CVVHD to diurese  -cannot use digoxin, acei/arb due to aki  -cannot use b blocker due to concern for low output  -hypotensive so cannot do nitrates/hydralazine  Leukocytosis  WBC improving 14.6>13. Patient's temperatures have been 97-98. Likely from acute stress.   Acute on chronic renal failure Cardiorenal syndrome ESRD Thought to be secondary to diabetes mellitus, cardiorenal syndrome, MGUS. [facility]   Patient continuing to get CRRT. 4.4L removed on 1/22. 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  Protein electrophoresis showing M spike 1.2%, albumin 2.6. Bone marrow biopsy showing 14% plasma cells. Oncology plans to do chemotherapy outpatient.   -appreciate oncology following   Chronic atrial fibrillation  Rates ranging 50-60s.   -continue heparin   Diabetes Mellitus  Patient's blood sugars have been ranging 150-170s. On nph 22u qd at home   -monitor glucose levels  -ssi  Subcutaneous nodules Indurated areas  on legs/abdomen  Concern for calciphylaxis vs warfarin skin necrosis. Punch biopsy done by general surgery 1/20 resulted no morphological evidence of calciphylaxis. 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   -follow up pathology  -stop warfarin -gabapentin 370m bid   Abdominal tenderness Constipation  Firm abdomen noted on examination. CT abdomen done 1/11 does not show any acute abnormality.   Patient has large stool burden, has not had bm in 4 days.   -miralax bid  -colace  -senna qd  -please document bm -discontinued oxycodone and tramadol -increased gabapentin from 3073mqd to 30064mid  Left Renal Cyst  1.5cm in size simple exophytic seen on CT chest 1/22  Ectatic ascending thoracic aorta  4.4cm in size.   -annual follow up  Best Practice / Goals of Care / Disposition.   DVT PROPHYLAXIS: heparin SUP: n/a NUTRITION: renal diet MOBILITY: bed rest GOALS OF CARE: Full code FAMILY DISCUSSIONS: Updated patient and husband at bedside DISPOSITION ICU  LABS  Glucose Recent Labs  Lab 03/18/19 2143 03/19/19 0641 03/19/19 1149 03/19/19 1527 03/19/19 2156 03/20/19 0632  GLUCAP 219* 149* 231* 257* 156* 161*    BMET Recent Labs  Lab 03/19/19 0513 03/19/19 1655 03/20/19 0440  NA 134* 132* 133*  K 4.4 4.2 4.6  CL 96* 97* 98  CO2 23 22 22   BUN 26* 26* 25*  CREATININE 2.13* 2.04* 1.89*  GLUCOSE 180* 240* 176*    Liver Enzymes Recent Labs  Lab 03/19/19 0513 03/19/19 1655 03/20/19 0440  ALBUMIN 2.8* 2.4* 2.6*    Electrolytes Recent Labs  Lab 03/19/19 0513 03/19/19 1655 03/20/19 0440  CALCIUM 8.8* 8.1* 8.8*  MG 2.3  --  2.5*  PHOS 3.4 3.1 2.7    CBC Recent Labs  Lab 03/18/19 0247 03/18/19 1030 03/18/19 1327 03/19/19 0513 03/20/19 0440  WBC 14.6*  --   --  14.6* 13.5*  HGB 9.2*   < > 10.9* 10.3* 9.9*  HCT 27.3*   < > 32.0* 31.3* 29.6*  PLT 214  --   --  235 222   < > = values in this interval not  displayed.    ABG Recent Labs  Lab 03/18/19 1030 03/18/19 1327  PHART 7.463* 7.431  PCO2ART 29.9* 36.2  PO2ART 52.0* 131.0*    Coag's Recent Labs  Lab 03/18/19 0247 03/19/19 0513 03/20/19 0440  INR 1.8* 1.7* 2.0*    Sepsis Markers No results for input(s): LATICACIDVEN, PROCALCITON, O2SATVEN in the last 168 hours.  Cardiac Enzymes No results for input(s): TROPONINI, PROBNP in the last 168 hours.  REVIEW OF SYSTEMS:    Patient states that breathing has improved from yesterday. Has breast tenderness and lower extremity pain.

## 2019-03-20 NOTE — Progress Notes (Signed)
Patient ID: [name], female   DOB: 09-17-1960, 59 y.o.   MRN: [id number]     Advanced Heart Failure Rounding Note  PCP-Cardiologist: Kate Sable, MD   Subjective:    Respiratory status improved. Off BiPAP.  Remains very weak and lethargic. C/o pain in legs and abdomen. No CP. Denies orthopnea or PND.   On CVVHD pulling -200 CVP still 19-20  New diagnosis of multiple myeloma. Bx for possible calciphylaxis was negative (? False negative).   Echo: EF < 20%, no LVH, mildly reduced RV systolic function, severe biatrial enlargement, severe TR.  RHC Procedural Findings (dobutamine 2.5 mcg/kg/min): Hemodynamics (mmHg) RA 18 RV 43/18 PA 45/19, mean 28 PCWP mean 19 Oxygen saturations: PA 63% AO 94% Cardiac Output (Fick) 6.1  Cardiac Index (Fick) 3.06 PVR 1.5 WU PAPI 1.4   Objective:   Weight Range: 75.3 kg Body mass index is 26.79 kg/m.   Vital Signs:   Temp:  [97 F (36.1 C)-97.5 F (36.4 C)] 97.5 F (36.4 C) (01/23 0743) Pulse Rate:  [56-85] 59 (01/23 1000) Resp:  [16-37] 30 (01/23 1000) BP: (82-124)/(48-88) 109/65 (01/23 1000) SpO2:  [72 %-100 %] 90 % (01/23 1000) FiO2 (%):  [80 %] 80 % (01/22 1940) Weight:  [75.3 kg] 75.3 kg (01/23 0500) Last BM Date: 03/17/19  Weight change: Filed Weights   03/17/19 1549 03/19/19 0500 03/20/19 0500  Weight: 80.9 kg 80 kg 75.3 kg    Intake/Output:   Intake/Output Summary (Last 24 hours) at 03/20/2019 1054 Last data filed at 03/20/2019 1000 Gross per 24 hour  Intake 997.5 ml  Output 4890 ml  Net -3892.5 ml      Physical Exam  CVP 19-20 with prominent v waves  General:  Ill and weak appearing. No resp difficulty HEENT: normal Neck: supple. + RIJ trialysis JVP to jaw . Carotids 2+ bilat; no bruits. No lymphadenopathy or thryomegaly appreciated. Cor: PMI nondisplaced. Regular rate & rhythm. 2/6 TR Lungs: clear Abdomen: soft, nontender, + distended. No hepatosplenomegaly. No bruits or masses. Good bowel  sounds. Extremities: no cyanosis, clubbing, rash, 2+ edema Neuro: lethargic but oriented. Non-focal   Telemetry   A fib BiV pacing 60s   Labs    CBC Recent Labs    03/19/19 0513 03/20/19 0440  WBC 14.6* 13.5*  HGB 10.3* 9.9*  HCT 31.3* 29.6*  MCV 84.1 82.9  PLT 235 798   Basic Metabolic Panel Recent Labs    03/19/19 0513 03/19/19 0513 03/19/19 1655 03/20/19 0440  NA 134*   < > 132* 133*  K 4.4   < > 4.2 4.6  CL 96*   < > 97* 98  CO2 23   < > 22 22  GLUCOSE 180*   < > 240* 176*  BUN 26*   < > 26* 25*  CREATININE 2.13*   < > 2.04* 1.89*  CALCIUM 8.8*   < > 8.1* 8.8*  MG 2.3  --   --  2.5*  PHOS 3.4   < > 3.1 2.7   < > = values in this interval not displayed.   Liver Function Tests Recent Labs    03/19/19 1655 03/20/19 0440  ALBUMIN 2.4* 2.6*   No results for input(s): LIPASE, AMYLASE in the last 72 hours. Cardiac Enzymes No results for input(s): CKTOTAL, CKMB, CKMBINDEX, TROPONINI in the last 72 hours.  BNP: BNP (last 3 results) Recent Labs    03/07/19 2101  BNP 1,072.0*    ProBNP (last 3 results)  No results for input(s): PROBNP in the last 8760 hours.   D-Dimer No results for input(s): DDIMER in the last 72 hours. Hemoglobin A1C No results for input(s): HGBA1C in the last 72 hours. Fasting Lipid Panel No results for input(s): CHOL, HDL, LDLCALC, TRIG, CHOLHDL, LDLDIRECT in the last 72 hours. Thyroid Function Tests No results for input(s): TSH, T4TOTAL, T3FREE, THYROIDAB in the last 72 hours.  Invalid input(s): FREET3  Other results:   Imaging    VAS [place] LOWER EXTREMITY VENOUS (DVT)  Result Date: 03/20/2019  Lower Venous Study Indications: Pain, and Edema. Other Indications: Acute respiratory failure and acute pulmonary edema. Limitations: Body habitus and Patient's inability to tolerate full compression of the vein. Comparison Study: Negative right lower ext. venous study on 03/13/2019. Performing Technologist: Oda Cogan RDMS, RVT   Examination Guidelines: A complete evaluation includes B-mode imaging, spectral Doppler, color Doppler, and power Doppler as needed of all accessible portions of each vessel. Bilateral testing is considered an integral part of a complete examination. Limited examinations for reoccurring indications may be performed as noted.  +---------+---------------+---------+-----------+----------+-------------------+ RIGHT    CompressibilityPhasicitySpontaneityPropertiesThrombus Aging      +---------+---------------+---------+-----------+----------+-------------------+ CFV      Full           Yes      Yes                                      +---------+---------------+---------+-----------+----------+-------------------+ SFJ      Full                                                             +---------+---------------+---------+-----------+----------+-------------------+ FV Prox  Full                                                             +---------+---------------+---------+-----------+----------+-------------------+ FV Mid                  Yes      Yes                  patient unable to                                                         tolerate                                                                  compression         +---------+---------------+---------+-----------+----------+-------------------+ FV Distal               Yes      Yes  patient unable to                                                         tolerate                                                                  compression         +---------+---------------+---------+-----------+----------+-------------------+ POP      Full           Yes      Yes                                      +---------+---------------+---------+-----------+----------+-------------------+ PTV      Full                                                              +---------+---------------+---------+-----------+----------+-------------------+ PERO     Full                                                             +---------+---------------+---------+-----------+----------+-------------------+ Patent femoral vein by color Doppler imaging.  +---------+---------------+---------+-----------+----------+-------------------+ LEFT     CompressibilityPhasicitySpontaneityPropertiesThrombus Aging      +---------+---------------+---------+-----------+----------+-------------------+ CFV      Full                                                             +---------+---------------+---------+-----------+----------+-------------------+ SFJ      Full                                                             +---------+---------------+---------+-----------+----------+-------------------+ FV Prox  Full                                                             +---------+---------------+---------+-----------+----------+-------------------+ FV Mid                  Yes      Yes                  patient  unable to                                                         tolerate                                                                  compression         +---------+---------------+---------+-----------+----------+-------------------+ FV Distal               Yes      Yes                  patient unable to                                                         tolerate                                                                  compression         +---------+---------------+---------+-----------+----------+-------------------+ PFV      Full                                                             +---------+---------------+---------+-----------+----------+-------------------+ POP      Full           Yes      Yes                                       +---------+---------------+---------+-----------+----------+-------------------+ PTV      Full                                                             +---------+---------------+---------+-----------+----------+-------------------+ PERO     Full                                                             +---------+---------------+---------+-----------+----------+-------------------+ Patent femoral vein by color Doppler imaging.   Summary: Right: There is no evidence of obvious deep vein thrombosis in the lower extremity. No  cystic structure found in the popliteal fossa. Left: There is no evidence of obvious deep vein thrombosis in the lower extremity. No cystic structure found in the popliteal fossa.  *See table(s) above for measurements and observations. Electronically signed by Ruta Hinds MD on 03/20/2019 at 9:43:27 AM.    Final      Medications:     Scheduled Medications: . Chlorhexidine Gluconate Cloth  6 each Topical Q0600  . docusate  200 mg Oral Daily  . gabapentin  300 mg Oral BID  . insulin aspart  0-9 Units Subcutaneous TID WC  . lidocaine (PF)  5 mL Other Once  . lidocaine  10 mL Intradermal Once  . midodrine  10 mg Oral TID WC  . polyethylene glycol  17 g Oral BID  . sodium chloride flush  3 mL Intravenous Q12H  . sodium chloride flush  3 mL Intravenous Q12H  . vitamin B-12  100 mcg Oral Daily    Infusions: .  prismasol BGK 4/2.5 800 mL/hr at 03/20/19 1007  .  prismasol BGK 4/2.5 200 mL/hr at 03/20/19 1044  . sodium chloride 10 mL/hr at 03/14/19 1615  . heparin 1,500 Units/hr (03/20/19 1043)  . prismasol BGK 4/2.5 1,000 mL/hr at 03/20/19 1044    PRN Medications: sodium chloride, acetaminophen **OR** acetaminophen, heparin, heparin, lidocaine, ondansetron **OR** ondansetron (ZOFRAN) IV, sodium chloride, sodium chloride, sodium chloride flush    Assessment/Plan   1. Acute Hypoxic Respiratory Failure - 2/2 volume overload. Improved today. Off bipap.   - CVP remains elevated at 19-20. Continue continuous CVVHD for volume removal.   2. 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  Last echo in 2/19 with EF 15-20%. She has a [employer] CRT-D device. Admitted with progressive dyspnea and 30 lb weight gain. Empirically started on dobutamine 2.5 due to concern for low output. RHC showed R>L heart failure with low PAPI adequate cardiac output on dobutamine.  She has started iHD, tolerates well.  Now off dobutamine.  Still volume overloaded with significant oxygen requirement. Minimal response to Lasix/metolazone.Now on CVVHD pulling -200.  Weight down 10 pounds overnight - CVP slightly improved but remains elevated at 20. Markedly overloaded on exam - Continue continuous CVVHD per nephrology. I d/w Dr. Joelyn Oms this am - Continue midodrine 10 mg tid with relative hypotension.  - Stopped digoxin and benazepril with AKI.  - Stopped beta blocker with concern for low output.  - No BP room for hydralazine/nitrates.  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  - LE u/s negative for DVT 2. AKI on CKD stage 4: Baseline creatinine around 3, went up to 5 with BUN 150 this admission. Suspect cardiorenal syndrome superimposed on possible diabetic nephropathy. Suspect she will be HD dependent but I am not sure she will tolerate iHDD - CVP 20. Continue CVVHD today.  -Nephrology appreciated.  3. Chronic atrial fibrillation: Warfarin held for procedures, now on heparin gtt. Long-term, will transition to apixaban with some concern for warfarin skin necrosis.    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  5. DM: Per primary.  6. Monoclonal gammopathy:  LV morphology does not look consistent with AL amyloidosis. She has been seen by hematology, has had bone marrow bx showing 14% plasma cells by flow cytometry on the aspirate which is by definition multiple myeloma. Seen by heme/onc, plan for  outpatient treatment.  7. Tricuspid regurgitation: Severe by echo.  8  Subcutaneous nodules: ?calciphylaxis versus  less likely warfarin skin necrosis.  - Off warfarin, will use Eliquis in future.  - Surgery consulted for biopsy. Bx negative for calciphylaxis. (? False negative)  - Renal managing possible calciphylaxis.   Given severe HF, hypotension, progressive renal failure and multiple myeloma with very poor function status suspect short-term prognosis is quite poor and I doubt she will tolerate iHD for long if at all. Consider Palliative Care input.   CRITICAL CARE Performed by: Glori Bickers  Total critical care time: 35 minutes  Critical care time was exclusive of separately billable procedures and treating other patients.  Critical care was necessary to treat or prevent imminent or life-threatening deterioration.  Critical care was time spent personally by me (independent of midlevel providers or residents) on the following activities: development of treatment plan with patient and/or surrogate as well as nursing, discussions with consultants, evaluation of patient's response to treatment, examination of patient, obtaining history from patient or surrogate, ordering and performing treatments and interventions, ordering and review of laboratory studies, ordering and review of radiographic studies, pulse oximetry and re-evaluation of patient's condition.    Length of Stay: [place], MD  03/20/2019, 10:54 AM  Advanced Heart Failure Team Pager [phone] (M-F; 7a - 4p)  Please contact [facility] for night-coverage after hours (4p -7a ) and weekends on [email or web address]

## 2019-03-20 NOTE — Procedures (Signed)
Admit: 03/07/2019 LOS: 105  67F AoCKD4, now ESRD, cardiorenal syndrome, persistent hypoxia, new dx of multiple myeloma  Current CRRT Prescription: Start Date: 03/18/19 (had been on HD previous) Catheter: R IJ 1/15 IR BFR: 300 Pre Blood Pump: 800 4K DFR: 1000 4K Replacement Rate: 200 4K Goal UF: 219m/h net negative Anticoagulation: systemic heparin Clotting: infrequent K 4.6 P 2.7  S: Tol UF at 2058mh 3.6L net negative yesterday Cont to req HF [place] Path on bx no calciphylaxis CVP still > 20 LE Dopplers neg for DVT  O: 01/22 0701 - 01/23 0700 In: 869.4 [P.O.:510; I.V.:359.4] Out: 4447   Filed Weights   03/17/19 1549 03/19/19 0500 03/20/19 0500  Weight: 80.9 kg 80 kg 75.3 kg    Recent Labs  Lab 03/19/19 0513 03/19/19 1655 03/20/19 0440  NA 134* 132* 133*  K 4.4 4.2 4.6  CL 96* 97* 98  CO2 23 22 22   GLUCOSE 180* 240* 176*  BUN 26* 26* 25*  CREATININE 2.13* 2.04* 1.89*  CALCIUM 8.8* 8.1* 8.8*  PHOS 3.4 3.1 2.7   Recent Labs  Lab 03/18/19 0247 03/18/19 1030 03/18/19 1327 03/19/19 0513 03/20/19 0440  WBC 14.6*  --   --  14.6* 13.5*  HGB 9.2*   < > 10.9* 10.3* 9.9*  HCT 27.3*   < > 32.0* 31.3* 29.6*  MCV 82.0  --   --  84.1 82.9  PLT 214  --   --  235 222   < > = values in this interval not displayed.    Scheduled Meds: . Chlorhexidine Gluconate Cloth  6 each Topical Q0600  . docusate  200 mg Oral Daily  . gabapentin  300 mg Oral BID  . insulin aspart  0-9 Units Subcutaneous TID WC  . lidocaine (PF)  5 mL Other Once  . lidocaine  10 mL Intradermal Once  . midodrine  10 mg Oral TID WC  . polyethylene glycol  17 g Oral BID  . sodium chloride flush  3 mL Intravenous Q12H  . sodium chloride flush  3 mL Intravenous Q12H  . vitamin B-12  100 mcg Oral Daily   Continuous Infusions: .  prismasol BGK 4/2.5 800 mL/hr at 03/20/19 1007  .  prismasol BGK 4/2.5 200 mL/hr at 03/20/19 1044  . sodium chloride 10 mL/hr at 03/14/19 1615  . heparin 1,500 Units/hr  (03/20/19 1043)  . prismasol BGK 4/2.5 1,000 mL/hr at 03/20/19 1044   PRN Meds:.sodium chloride, acetaminophen **OR** acetaminophen, heparin, heparin, lidocaine, ondansetron **OR** ondansetron (ZOFRAN) IV, sodium chloride, sodium chloride, sodium chloride flush  ABG    Component Value Date/Time   PHART 7.431 03/18/2019 1327   PCO2ART 36.2 03/18/2019 1327   PO2ART 131.0 (H) 03/18/2019 1327   HCO3 24.1 03/18/2019 1327   TCO2 25 03/18/2019 1327   ACIDBASEDEF 2.0 03/18/2019 1030   O2SAT 99.0 03/18/2019 1327    A/P  1. Dialysis dependent AoCKD4, now ESRD.  Tol CRRT for UF, but hypoxia persists 2. HTN/Vol: as above 3. Painful subcutaneous nodules of skin: path neg for calciphylaxis and CKD-BMD parameters not bad, off C3.  P and Ca ok. Not on thiosulfate, CTM 4. New Dx Myeloma 5. Hypoxic RF on HFNC /  BiPAP, not improving 6. Chronic AFib, will req DOAC moving fwd, would not resume warfarin 2/2 #3 7. DM2 8. Acute sCHF, NICM, AHF following; off diuretics, was refractory 9. Anemia: defer to hematology  Cont current settings today.    RyPearson GrippeMD [facility]

## 2019-03-20 NOTE — Progress Notes (Signed)
ANTICOAGULATION CONSULT NOTE - [place] for heparin Indication: atrial fibrillation  Allergies  Allergen Reactions  . Wheat Swelling  . Latex Rash  . Penicillins Rash  . Sulfa Antibiotics Rash    Patient Measurements: Height: 5\' 6"  (167.6 cm) Weight: 166 lb 0.1 oz (75.3 kg) IBW/kg (Calculated) : 59.3  Vital Signs: Temp: 97.5 F (36.4 C) (01/23 0743) Temp Source: Oral (01/23 0743) BP: 106/69 (01/23 0900) Pulse Rate: 59 (01/23 0900)  Labs: Recent Labs    03/18/19 0247 03/18/19 1030 03/18/19 1327 03/18/19 1822 03/19/19 0513 03/19/19 1655 03/20/19 0440  HGB 9.2*   < > 10.9*   < > 10.3*  --  9.9*  HCT 27.3*   < > 32.0*  --  31.3*  --  29.6*  PLT 214  --   --   --  235  --  222  LABPROT 20.7*  --   --   --  20.1*  --  22.6*  INR 1.8*  --   --   --  1.7*  --  2.0*  HEPARINUNFRC 0.36  --   --   --  0.56  --  0.36  CREATININE 2.72*  --   --    < > 2.13* 2.04* 1.89*   < > = values in this interval not displayed.    Estimated Creatinine Clearance: 33.2 mL/min (A) (by C-G formula based on SCr of 1.89 mg/dL (H)).   Assessment: 59 yo female with hx AFib, Coumadin on hold, continue heparin bridge.   Heparin level therapeutic, INR up to 2 but pt has not received warfarin in >1 week, CBC stable.  Goal of Therapy:  Heparin level goal 0.3-0.7 Monitor platelets by anticoagulation protocol: Yes   Plan:  -Continue heparin 1500 units/h -Daily heparin level and CBC   Arrie Senate, PharmD, BCPS Clinical Pharmacist [phone] Please check AMION for all [facility] numbers 03/20/2019

## 2019-03-21 LAB — RENAL FUNCTION PANEL
Albumin: 2.8 g/dL — ABNORMAL LOW (ref 3.5–5.0)
Albumin: 2.9 g/dL — ABNORMAL LOW (ref 3.5–5.0)
Anion gap: 15 (ref 5–15)
Anion gap: 14 (ref 5–15)
BUN: 25 mg/dL — ABNORMAL HIGH (ref 6–20)
BUN: 25 mg/dL — ABNORMAL HIGH (ref 6–20)
CO2: 21 mmol/L — ABNORMAL LOW (ref 22–32)
CO2: 21 mmol/L — ABNORMAL LOW (ref 22–32)
Calcium: 9.1 mg/dL (ref 8.9–10.3)
Calcium: 8.9 mg/dL (ref 8.9–10.3)
Chloride: 94 mmol/L — ABNORMAL LOW (ref 98–111)
Chloride: 95 mmol/L — ABNORMAL LOW (ref 98–111)
Creatinine, Ser: 1.7 mg/dL — ABNORMAL HIGH (ref 0.44–1.00)
Creatinine, Ser: 1.74 mg/dL — ABNORMAL HIGH (ref 0.44–1.00)
GFR calc Af Amer: 38 mL/min — ABNORMAL LOW (ref >60)
GFR calc Af Amer: 37 mL/min — ABNORMAL LOW (ref >60)
GFR calc non Af Amer: 32 mL/min — ABNORMAL LOW (ref >60)
GFR calc non Af Amer: 32 mL/min — ABNORMAL LOW (ref >60)
Glucose, Bld: 174 mg/dL — ABNORMAL HIGH (ref 70–99)
Glucose, Bld: 228 mg/dL — ABNORMAL HIGH (ref 70–99)
Phosphorus: 2.7 mg/dL (ref 2.5–4.6)
Phosphorus: 2.6 mg/dL (ref 2.5–4.6)
Potassium: 4.9 mmol/L (ref 3.5–5.1)
Potassium: 4.6 mmol/L (ref 3.5–5.1)
Sodium: 130 mmol/L — ABNORMAL LOW (ref 135–145)
Sodium: 130 mmol/L — ABNORMAL LOW (ref 135–145)

## 2019-03-21 LAB — CBC
HCT: 32 % — ABNORMAL LOW (ref 36.0–46.0)
Hemoglobin: 10.7 g/dL — ABNORMAL LOW (ref 12.0–15.0)
MCH: 27.5 pg (ref 26.0–34.0)
MCHC: 33.4 g/dL (ref 30.0–36.0)
MCV: 82.3 fL (ref 80.0–100.0)
Platelets: 251 10*3/uL (ref 150–400)
RBC: 3.89 MIL/uL (ref 3.87–5.11)
RDW: 18.7 % — ABNORMAL HIGH (ref 11.5–15.5)
WBC: 15.9 10*3/uL — ABNORMAL HIGH (ref 4.0–10.5)
nRBC: 2.4 % — ABNORMAL HIGH (ref 0.0–0.2)

## 2019-03-21 LAB — MAGNESIUM: Magnesium: 2.6 mg/dL — ABNORMAL HIGH (ref 1.7–2.4)

## 2019-03-21 LAB — HEPARIN LEVEL (UNFRACTIONATED): Heparin Unfractionated: 0.62 IU/mL (ref 0.30–0.70)

## 2019-03-21 LAB — PROTIME-INR
INR: 1.8 — ABNORMAL HIGH (ref 0.8–1.2)
Prothrombin Time: 20.7 seconds — ABNORMAL HIGH (ref 11.4–15.2)

## 2019-03-21 LAB — GLUCOSE, CAPILLARY
Glucose-Capillary: 159 mg/dL — ABNORMAL HIGH (ref 70–99)
Glucose-Capillary: 252 mg/dL — ABNORMAL HIGH (ref 70–99)
Glucose-Capillary: 238 mg/dL — ABNORMAL HIGH (ref 70–99)
Glucose-Capillary: 201 mg/dL — ABNORMAL HIGH (ref 70–99)

## 2019-03-21 MED ORDER — POLYETHYLENE GLYCOL 3350 17 G PO PACK
17.0000 g | PACK | Freq: Once | ORAL | Status: AC
  Administered 2019-03-21: 17 g via ORAL

## 2019-03-21 MED ORDER — DOCUSATE SODIUM 100 MG PO CAPS
200.0000 mg | ORAL_CAPSULE | Freq: Once | ORAL | Status: AC
  Administered 2019-03-21: 200 mg via ORAL

## 2019-03-21 MED ORDER — ALLOPURINOL 300 MG PO TABS
150.0000 mg | ORAL_TABLET | Freq: Every morning | ORAL | Status: DC
  Administered 2019-03-21 – 2019-03-29 (×9): 150 mg via ORAL
  Filled 2019-03-21 (×9): qty 1

## 2019-03-21 MED ORDER — CARVEDILOL PHOSPHATE ER 40 MG PO CP24: 40.0000 mg | ORAL_CAPSULE | Freq: Every day | ORAL | Status: DC

## 2019-03-21 NOTE — Evaluation (Signed)
Physical Therapy Evaluation Patient Details Name: [name] MRN: [id number] DOB: [birth year] Today's Date: 03/21/2019   History of Present Illness  59 year old with PMH significant for MGUS, thrombocytopenia, A. fib on Coumadin, CHB with pacemaker, nonischemic cardiomyopathy, CKD stage IV baseline creatinine 3.0, cirrhosis who presented with several weeks of progressive swelling about 30 pound weight gain. critically ill due to acute respiratory failure requiring BiPAP due to acute pulmonary edema due to acute decompensated HFrEF in context of stage V ESRD; Doctors considering intermittent HD, and CHF Team recommending Palliative Care consult  Clinical Impression   Pt admitted with above diagnosis. Walking household distances without assist or device prior to admission; Presents to PT with decr functional mobility, decr activity tolerance, generalized weakness, and general deconditioning in setting of critical illness;  Pt currently with functional limitations due to the deficits listed below (see PT Problem List). Pt will benefit from skilled PT to increase their independence and safety with mobility to allow discharge to the venue listed below.    DC recommendations will depend on functional progress, Goals of Care and pt's wishes; if we opt for the HD route, she will need to tolerate sitting in the recliner, OOB, at least 6 hours; and when she begins iHD, will need to tolerate it in HD chair; given deconditioning, post-acute rehab makes the best sense, however noted her insurance status could make finding a SNF bed difficult; CIR can take Medicaid patients, but at this point, I'm not sure that she will be able to tolerate the more intensive therapies at CIR; to be able to go home, she will need to tolerate not just HD, but the trip to and from the HD Center -- will monitor for progress;   Noted Heart Failure Team recommending Palliative Care Medicine Consult.    Follow Up Recommendations Other  (comment)(Will depend on Goals of Care; Post-acute rehab is certainly worth pursuing -- see discussion above )    Equipment Recommendations  Rolling walker with 5" wheels;3in1 (PT);Wheelchair (measurements PT);Wheelchair cushion (measurements PT)    Recommendations for Other Services       Precautions / Restrictions Precautions Precautions: Fall Precaution Comments: watch O2      Mobility  Bed Mobility               General bed mobility comments: in recliner upon arrival  Transfers Overall transfer level: Needs assistance Equipment used: Rolling walker (2 wheeled) Transfers: Sit to/from Stand Sit to Stand: Mod assist         General transfer comment: Light mod assist to power up and steady; cues for hand placement  Ambulation/Gait Ambulation/Gait assistance: Min assist;+2 safety/equipment Gait Distance (Feet): (March in place 20 paces) Assistive device: Rolling walker (2 wheeled)       General Gait Details: Decr step height, and noted incr lateral translation R and L of center of mass with steps  Stairs            Wheelchair Mobility    Modified Rankin (Stroke Patients Only)       Balance     Sitting balance-Leahy Scale: (approaching Fair)     Standing balance support: Bilateral upper extremity supported Standing balance-Leahy Scale: Poor Standing balance comment: dependent on external support                             Pertinent Vitals/Pain Pain Assessment: Faces Faces Pain Scale: Hurts a little bit Pain Location: General; Grimace  also likely related to effort Pain Descriptors / Indicators: Grimacing Pain Intervention(s): Monitored during session    [place] expects to be discharged to:: Private residence Living Arrangements: Spouse/significant other Available Help at Discharge: Family;Available 24 hours/day Type of Home: Apartment Home Access: Stairs to enter   Entrance Stairs-Number of Steps:  1 Home Layout: One level Home Equipment: (to be determined)      Prior Function Level of Independence: Independent               Hand Dominance   Dominant Hand: Right    Extremity/Trunk Assessment   Upper Extremity Assessment Upper Extremity Assessment: Defer to OT evaluation    Lower Extremity Assessment Lower Extremity Assessment: Generalized weakness    Cervical / Trunk Assessment Cervical / Trunk Assessment: Normal  Communication   Communication: No difficulties  Cognition Arousal/Alertness: Awake/alert Behavior During Therapy: WFL for tasks assessed/performed Overall Cognitive Status: Within Functional Limits for tasks assessed(for simple mobility tasks)                                 General Comments: Appears much imporved compared to OT eval on 1/20      General Comments General comments (skin integrity, edema, etc.): Session conducted on supplemental O2, VSS    Exercises     Assessment/Plan    PT Assessment Patient needs continued PT services  PT Problem List Decreased strength;Decreased activity tolerance;Decreased balance;Decreased mobility;Decreased coordination;Decreased knowledge of use of DME;Decreased safety awareness;Decreased knowledge of precautions;Cardiopulmonary status limiting activity       PT Treatment Interventions DME instruction;Gait training;Functional mobility training;Stair training;Therapeutic activities;Therapeutic exercise;Balance training;Neuromuscular re-education;Cognitive remediation;Patient/family education;Wheelchair mobility training    PT Goals (Current goals can be found in the Care Plan section)  Acute Rehab PT Goals Patient Stated Goal: get better, play with grandkids PT Goal Formulation: With patient Time For Goal Achievement: 04/04/19 Potential to Achieve Goals: Fair    Frequency Min 3X/week   Barriers to discharge Other (comment) Will depend on Goals of Care and pt's wishes; if we opt for  the HD route, she will need to tolerate sitting in the recliner, OOB, at least 6 hours; and when she begins iHD, will need to tolerate it in HD chair; given deconditioning, post-acute rehab makes the best sense, however noted her insurance status will make finding a SNF bed difficult; CIR can take Medicaid patients, but at this point, I'm not sure that she will be able to tolerate the more intensive therapies at CIR; to be able to go home, she will need to tolerate not just HD, but the trip to and from the HD Center -- will monitor for progress    Co-evaluation               AM-PAC PT "6 Clicks" Mobility  Outcome Measure Help needed turning from your back to your side while in a flat bed without using bedrails?: A Little Help needed moving from lying on your back to sitting on the side of a flat bed without using bedrails?: A Lot Help needed moving to and from a bed to a chair (including a wheelchair)?: A Lot Help needed standing up from a chair using your arms (e.g., wheelchair or bedside chair)?: A Little Help needed to walk in hospital room?: A Little Help needed climbing 3-5 steps with a railing? : A Lot 6 Click Score: 15    End of Session  Activity Tolerance: Patient tolerated treatment well Patient left: in chair;with call bell/phone within reach;with family/visitor present Nurse Communication: Mobility status PT Visit Diagnosis: Unsteadiness on feet (R26.81);Other abnormalities of gait and mobility (R26.89);Muscle weakness (generalized) (M62.81)    Time: 3582-5189 PT Time Calculation (min) (ACUTE ONLY): 17 min   Charges:   PT Evaluation $PT Eval Moderate Complexity: 1 Mod          Roney [place], [place]  Acute Rehabilitation Services Pager [phone] Office [phone]   [place] 03/21/2019, 2:38 PM

## 2019-03-21 NOTE — Progress Notes (Signed)
PULMONARY / CRITICAL CARE MEDICINE   NAME:  [name], MRN:  [id number], DOB:  07/10/1960, LOS: 13 ADMISSION DATE:  03/07/2019, CONSULTATION DATE:   REFERRING MD:  Dr. 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   HISTORY OF PRESENT ILLNESS    [name] neck is a 59-year-old female with chronic A. fib, hepatic cirrhosis, acute on chronic systolic heart failure ef 15-20% with ic, acute on chronic renal failure, thrombocytopenia, congenital third-degree AV block with biventricular ICD which was upgraded 5 years ago, diabetes mellitus type 2, and gout who was admitted on 03/07/2019 for increased swelling in his legs and abdomen, having gained 3 kg since 03/03/2019, orthopnea, PND, productive cough.  Patient became hypoxic earlier today down to 50% and tachypnea. She required increased oxygen upto 15L via HFNC and NRB and subsequently placed on bipap. CVP was 22. Patient transferred to ICU for management of acute hypoxic   SIGNIFICANT PAST MEDICAL HISTORY   Automatic implantable cardioverter-defibrillator in situ, Chronic anticoagulation, Chronic atrial fibrillation (HCC), Chronic kidney disease, Chronic systolic CHF (congestive heart failure) (HCC), Congenital third degree heart block, Dizziness and giddiness (05/19/2012), Dysphagia (08/14/2011), GERD (gastroesophageal reflux disease), Helicobacter pylori gastritis (08/14/2011), IDDM (insulin dependent diabetes mellitus), Kidney stones (1990's), Nonischemic cardiomyopathy (HCC), Potassium (K) excess, and Stroke (HCC) (1999).  SIGNIFICANT EVENTS:  1/10 admitted to [facility] for acute on chronic heart failure exacerbation  1/20 punch biopsy done   1/21 transferred to icu for acute hypoxic respiratory failure, placed on bipap, on crrt 1/22 CT chest without pe, was on 13L HFNC, got bipap intermittently, on crrt  STUDIES:   TTE 1/12  1. Left ventricular ejection fraction, by visual estimation, is <20%. The left ventricle has severely decreased function. There is no left ventricular hypertrophy.  2. Definity contrast agent was given IV to delineate the left ventricular endocardial borders.  3. Abnormal septal motion consistent with left bundle branch block.  4. Left ventricular diastolic parameters are consistent with Grade III diastolic dysfunction (restrictive).  5. Moderately dilated left ventricular internal cavity size.  6. The left ventricle demonstrates global hypokinesis.  7. Global right ventricle has mildly reduced systolic function.The right ventricular size is severely enlarged. No increase in right ventricular wall thickness.  8. Left atrial size was severely dilated.  9. Right atrial size was severely dilated. 10. Trivial pericardial effusion is present. 11. The mitral valve is normal in structure. No evidence of mitral valve regurgitation. 12. The tricuspid valve is normal in structure. 13. The aortic valve is normal in structure. Aortic valve regurgitation is not visualized. Mild aortic valve sclerosis without stenosis. 14. Pulmonic regurgitation is moderate. 15. The pulmonic valve was normal in structure. Pulmonic valve regurgitation is moderate. 16. Mildly dilated pulmonary artery. 17. Mildly elevated pulmonary artery systolic pressure. 18. The tricuspid regurgitant velocity is 2.32 m/s, and with an assumed right atrial pressure of 15 mmHg, the estimated right ventricular systolic pressure is mildly elevated at 36.6 mmHg. 19. A pacer wire is visualized. 20. The inferior vena cava is dilated in size with <50% respiratory variability, suggesting right atrial pressure of 15 mmHg. 21. No intracardiac thrombi or masses were  visualized. 22. No significant change from   prior study (April 10, 2017). 23. Prior images reviewed side by side  Right heart cath 03/09/2019 1. Right > left heart failure with low PAPI.  2. Pulmonary venous hypertension.  3. Adequate cardiac output on dobutamine 2.5 mcg/kg/min  CT chest 1/22 IMPRESSION: 1. Moderate cardiomegaly.  One vessel coronary atherosclerosis. 2. Dilated main pulmonary artery, suggesting pulmonary arterial hypertension. 3. Mild platelike scarring versus atelectasis at the left lung base. Otherwise no active pulmonary disease. No pleural effusions. 4. Nonspecific mild left prevascular mediastinal lymphadenopathy. 5. Ectatic 4.4 cm ascending thoracic aorta. Recommend annual imaging followup by CTA or MRA. This recommendation follows 2010 ACCF/AHA/AATS/ACR/ASA/SCA/SCAI/SIR/STS/SVM Guidelines for the Diagnosis and Management of Patients with Thoracic Aortic Disease. Circulation. 2010; 121: Q947-M546. Aortic aneurysm NOS (ICD10-I71.9). 6. Finely irregular liver surface, cannot exclude cirrhosis.  Aortic Atherosclerosis (ICD10-I70.0).  CULTURES:   MRSA PCR 1/11 negative  Respiratory viral panel 1/10 neg covid, neg flu a and b   ANTIBIOTICS:    LINES/TUBES:  Peripheral IV right hand 1/15 Peripheral Iv left forearm 1/17  CONSULTANTS:  Nephrology Heart Failure  Surgery  SUBJECTIVE:  Patient states that she has severe pain in her bilateral lower extremities and abdomen. Husband who is a Theme park manager at [employer] is at bedside.   CONSTITUTIONAL: BP 111/60 (BP Location: Left Arm)   Pulse (!) 59   Temp (!) 97.4 F (36.3 C) (Oral)   Resp (!) 21   Ht 5' 6" (1.676 m)   Wt 70.8 kg   SpO2 99%   BMI 25.19 kg/m   I/O last 3 completed shifts: In: 2666 [P.O.:2130; I.V.:536] Out: 9305 [Other:8805; Stool:500]  CVP:  [7 mmHg-23 mmHg] 17 mmHg     PHYSICAL EXAM: General:  Laying in bed Neuro:alert, oriented, able to follow commands HEENT: no  conjunctival injection  Cardiovascular:  Rrr, s1 s2 and s3 audible, jvp 3cm above stenal angle Lungs:  No rales or rhonchi  Abdomen:  Firm, distended Musculoskeletal: no pitting edema Skin:  Warm extremities    ASSESSMENT AND PLAN    Acute hypoxemic respiratory failure Acute respiratory alkalosis Thought to be secondary to pulmonary edema.  - On [place] weaned from 8 to 7 L this morning  -Will continue to monitor respiratory needs  -continue CRRT per nephrology  Acute on chronic systolic heart failure, TK<35% Longstanding NICM [employer] CRT-D ~4.4L of fluid removed from patient over the past 24 hrs. Her CVP is 13 today.   -follow cvp -continue midodrine 41m tid, consider dose when on dialysis  -CVVHD to diurese  -stopped  digoxin, ace due to AKI, now ERSD.  -cannot use b blocker due to concern for low output  -hypotensive so cannot do nitrates/hydralazine - Meds per heart failure  Leukocytosis  WBC improving 14.6>13. Patient's temperatures have been 97-98. Likely from acute stress.   Acute on chronic renal failure Cardiorenal syndrome ESRD Thought to be secondary to diabetes mellitus, cardiorenal syndrome.[facility].  - CRRT -Appreciate nephrology following  Multiple Myeloma  Abnormal spep/upep Kappa free light chain 2004, lambda free light chain 46, kappa/lambda ratio 43.  Protein electrophoresis showing M spike 1.2%, albumin 2.6. Bone marrow biopsy showing 14% plasma cells. Oncology plans to do chemotherapy outpatient.  -appreciate oncology following   Chronic atrial fibrillation  -continue heparin per pharmacy dosing  Diabetes Mellitus   On nph 22u qd at home .  -monitor glucose levels  -ssi  Subcutaneous nodules Indurated areas on legs/abdomen  Concern for calciphylaxis vs warfarin skin necrosis. Punch biopsy done by general surgery 1/20 resulted no morphological evidence of  calciphylaxis. 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  -stopped warfarin -gabapentin 300mg bid   Abdominal tenderness Constipation  Firm abdomen noted on examination. CT abdomen done 1/11 does not show any acute abnormality.  Patient has large stool burden. Bowel movement yesterday, not today . Continue to monitor.   -miralax bid  -colace  -senna qd  -please document bm -discontinued oxycodone and tramadol -increased gabapentin from 300mg qd to 300mg bid  Left Renal Cyst  1.5cm in size simple exophytic seen on CT chest 1/22  Ectatic ascending thoracic aorta  4.4cm in size.  -annual follow up  Best Practice / Goals of Care / Disposition.   DVT PROPHYLAXIS: heparin SUP: n/a NUTRITION: renal diet MOBILITY: bed rest GOALS OF CARE: Full code FAMILY DISCUSSIONS: Update patient and husband at bedside DISPOSITION ICU  LABS  Glucose Recent Labs  Lab 03/19/19 2156 03/20/19 0632 03/20/19 1150 03/20/19 1602 03/20/19 2132 03/21/19 0653  GLUCAP 156* 161* 278* 172* 175* 159*    BMET Recent Labs  Lab 03/20/19 0440 03/20/19 1600 03/21/19 0452  NA 133* 131* 130*  K 4.6 4.6 4.9  CL 98 95* 94*  CO2 22 20* 21*  BUN 25* 24* 25*  CREATININE 1.89* 1.83* 1.70*  GLUCOSE 176* 214* 174*    Liver Enzymes Recent Labs  Lab 03/20/19 0440 03/20/19 1600 03/21/19 0452  ALBUMIN 2.6* 2.9* 2.8*    Electrolytes Recent Labs  Lab 03/19/19 0513 03/19/19 1655 03/20/19 0440 03/20/19 1600 03/21/19 0452  CALCIUM 8.8*   < > 8.8* 9.0 9.1  MG 2.3  --  2.5*  --  2.6*  PHOS 3.4   < > 2.7 2.6 2.7   < > = values in this interval not displayed.    CBC Recent Labs  Lab 03/19/19 0513 03/20/19 0440 03/21/19 0452  WBC 14.6* 13.5* 15.9*  HGB 10.3* 9.9* 10.7*  HCT 31.3* 29.6* 32.0*  PLT 235 222 251    ABG Recent Labs  Lab 03/18/19 1030 03/18/19 1327  PHART 7.463* 7.431  PCO2ART 29.9* 36.2  PO2ART 52.0* 131.0*    Coag's Recent  Labs  Lab 03/19/19 0513 03/20/19 0440 03/21/19 0452  INR 1.7* 2.0* 1.8*    Sepsis Markers No results for input(s): LATICACIDVEN, PROCALCITON, O2SATVEN in the last 168 hours.  Cardiac Enzymes No results for input(s): TROPONINI, PROBNP in the last 168 hours.  Jeff Steen, MD PGY1  See Attending Attestation Note for Final Recommendations.   

## 2019-03-21 NOTE — Procedures (Signed)
Admit: 03/07/2019 LOS: 23  72F AoCKD4, now ESRD, cardiorenal syndrome, persistent hypoxia, new dx of multiple myeloma  Current CRRT Prescription: Start Date: 03/18/19 (had been on HD previous) Catheter: R IJ 1/15 IR BFR: 300 Pre Blood Pump: 800 4K DFR: 1000 4K Replacement Rate: 200 4K Goal UF: 212m/h net negative Anticoagulation: systemic heparin Clotting: infrequent K 4.9 P 2.7  S: Tol UF at 2058mh CVPs creepting down Still HF [place] 4.3L negative yesterday 3.6L net negative yesterday Cont to req HF [place] Still with sacral edema  O: 01/23 0701 - 01/24 0700 In: 2426.7 [P.O.:2070; I.V.:356.7] Out: 6784 [Stool:500]  Filed Weights   03/19/19 0500 03/20/19 0500 03/21/19 0500  Weight: 80 kg 75.3 kg 70.8 kg    Recent Labs  Lab 03/20/19 0440 03/20/19 1600 03/21/19 0452  NA 133* 131* 130*  K 4.6 4.6 4.9  CL 98 95* 94*  CO2 22 20* 21*  GLUCOSE 176* 214* 174*  BUN 25* 24* 25*  CREATININE 1.89* 1.83* 1.70*  CALCIUM 8.8* 9.0 9.1  PHOS 2.7 2.6 2.7   Recent Labs  Lab 03/19/19 0513 03/20/19 0440 03/21/19 0452  WBC 14.6* 13.5* 15.9*  HGB 10.3* 9.9* 10.7*  HCT 31.3* 29.6* 32.0*  MCV 84.1 82.9 82.3  PLT 235 222 251    Scheduled Meds: . Chlorhexidine Gluconate Cloth  6 each Topical Q0600  . docusate  200 mg Oral Daily  . gabapentin  300 mg Oral BID  . insulin aspart  0-9 Units Subcutaneous TID WC  . lidocaine (PF)  5 mL Other Once  . lidocaine  10 mL Intradermal Once  . midodrine  10 mg Oral TID WC  . polyethylene glycol  17 g Oral BID  . sodium chloride flush  3 mL Intravenous Q12H  . sodium chloride flush  3 mL Intravenous Q12H  . vitamin B-12  100 mcg Oral Daily   Continuous Infusions: .  prismasol BGK 4/2.5 800 mL/hr at 03/21/19 0700  .  prismasol BGK 4/2.5 200 mL/hr at 03/20/19 1044  . sodium chloride 10 mL/hr at 03/14/19 1615  . heparin 1,500 Units/hr (03/21/19 0800)  . prismasol BGK 4/2.5 1,000 mL/hr at 03/21/19 0727   PRN Meds:.sodium chloride,  acetaminophen **OR** acetaminophen, heparin, heparin, lidocaine, ondansetron **OR** ondansetron (ZOFRAN) IV, oxyCODONE, sodium chloride, sodium chloride, sodium chloride flush  ABG    Component Value Date/Time   PHART 7.431 03/18/2019 1327   PCO2ART 36.2 03/18/2019 1327   PO2ART 131.0 (H) 03/18/2019 1327   HCO3 24.1 03/18/2019 1327   TCO2 25 03/18/2019 1327   ACIDBASEDEF 2.0 03/18/2019 1030   O2SAT 99.0 03/18/2019 1327    A/P  1. Dialysis dependent AoCKD4, now ESRD.  Tol CRRT for UF, but hypoxia persists 2. HTN/Vol: as above, cont UF with CRRT for another 24h 3. Painful subcutaneous nodules of skin: path neg for calciphylaxis (?False Negative) and CKD-BMD parameters not bad, off C3.  P and Ca ok. Not on thiosulfate, CTM 4. New Dx Myeloma 5. Hypoxic RF on HFNC /  BiPAP, not improving 6. Chronic AFib, will req DOAC moving fwd, would not resume warfarin 2/2 #3 7. DM2 8. Acute sCHF, NICM, pulm HTN, AHF following; off diuretics as she was refractory 9. Anemia: defer to hematology  Cont current settings today.    RyPearson GrippeMD [facility]

## 2019-03-21 NOTE — Progress Notes (Signed)
Patient ID: [name], female   DOB: May 21, 1960, 59 y.o.   MRN: [id number]     Advanced Heart Failure Rounding Note  PCP-Cardiologist: Kate Sable, MD   Subjective:    Respiratory status improved. Now on 8L HFNC. Sats in mid 90s  Remains on CVVHD pulling -200. Weight down 20 pounds in 2 days. CVP 20 -> 13  Sitting up in chair today. Feels a bit better. Still weak. Denies SOB, orthopnea or PND.   New diagnosis of multiple myeloma. Bx for possible calciphylaxis was negative (? False negative).   Echo: EF < 20%, no LVH, mildly reduced RV systolic function, severe biatrial enlargement, severe TR.  RHC Procedural Findings (dobutamine 2.5 mcg/kg/min): Hemodynamics (mmHg) RA 18 RV 43/18 PA 45/19, mean 28 PCWP mean 19 Oxygen saturations: PA 63% AO 94% Cardiac Output (Fick) 6.1  Cardiac Index (Fick) 3.06 PVR 1.5 WU PAPI 1.4   Objective:   Weight Range: 70.8 kg Body mass index is 25.19 kg/m.   Vital Signs:   Temp:  [96.5 F (35.8 C)-97.9 F (36.6 C)] 97.4 F (36.3 C) (01/24 0755) Pulse Rate:  [54-114] 59 (01/24 1000) Resp:  [13-35] 28 (01/24 1000) BP: (97-112)/(56-92) 102/65 (01/24 1000) SpO2:  [86 %-100 %] 98 % (01/24 1000) Weight:  [70.8 kg] 70.8 kg (01/24 0500) Last BM Date: 03/20/19  Weight change: Filed Weights   03/19/19 0500 03/20/19 0500 03/21/19 0500  Weight: 80 kg 75.3 kg 70.8 kg    Intake/Output:   Intake/Output Summary (Last 24 hours) at 03/21/2019 1058 Last data filed at 03/21/2019 1000 Gross per 24 hour  Intake 2578.63 ml  Output 6242 ml  Net -3663.37 ml      Physical Exam   General:  Sitting in chair. Chewing gum. Weak appearing. No resp difficulty HEENT: normal Neck: supple. JVP to jaw RIJ tunneled trialysis Carotids 2+ bilat; no bruits. No lymphadenopathy or thryomegaly appreciated. Cor: PMI nondisplaced. Regular rate & rhythm. 2/6 TR Lungs: clear Abdomen: soft, nontender, nondistended. No hepatosplenomegaly. No bruits or  masses. Good bowel sounds. Extremities: no cyanosis, clubbing, rash, 2+ edema into thighs  Neuro: alert & orientedx3, cranial nerves grossly intact. moves all 4 extremities w/o difficulty. Affect pleasant   Telemetry   A fib BiV pacing 60 Personally reviewed    Labs    CBC Recent Labs    03/20/19 0440 03/21/19 0452  WBC 13.5* 15.9*  HGB 9.9* 10.7*  HCT 29.6* 32.0*  MCV 82.9 82.3  PLT 222 937   Basic Metabolic Panel Recent Labs    03/20/19 0440 03/20/19 0440 03/20/19 1600 03/21/19 0452  NA 133*   < > 131* 130*  K 4.6   < > 4.6 4.9  CL 98   < > 95* 94*  CO2 22   < > 20* 21*  GLUCOSE 176*   < > 214* 174*  BUN 25*   < > 24* 25*  CREATININE 1.89*   < > 1.83* 1.70*  CALCIUM 8.8*   < > 9.0 9.1  MG 2.5*  --   --  2.6*  PHOS 2.7   < > 2.6 2.7   < > = values in this interval not displayed.   Liver Function Tests Recent Labs    03/20/19 1600 03/21/19 0452  ALBUMIN 2.9* 2.8*   No results for input(s): LIPASE, AMYLASE in the last 72 hours. Cardiac Enzymes No results for input(s): CKTOTAL, CKMB, CKMBINDEX, TROPONINI in the last 72 hours.  BNP: BNP (last 3 results)  Recent Labs    03/07/19 2101  BNP 1,072.0*    ProBNP (last 3 results) No results for input(s): PROBNP in the last 8760 hours.   D-Dimer No results for input(s): DDIMER in the last 72 hours. Hemoglobin A1C No results for input(s): HGBA1C in the last 72 hours. Fasting Lipid Panel No results for input(s): CHOL, HDL, LDLCALC, TRIG, CHOLHDL, LDLDIRECT in the last 72 hours. Thyroid Function Tests No results for input(s): TSH, T4TOTAL, T3FREE, THYROIDAB in the last 72 hours.  Invalid input(s): FREET3  Other results:   Imaging    No results found.   Medications:     Scheduled Medications: . Chlorhexidine Gluconate Cloth  6 each Topical Q0600  . docusate  200 mg Oral Daily  . gabapentin  300 mg Oral BID  . insulin aspart  0-9 Units Subcutaneous TID WC  . lidocaine (PF)  5 mL Other Once   . lidocaine  10 mL Intradermal Once  . midodrine  10 mg Oral TID WC  . polyethylene glycol  17 g Oral BID  . sodium chloride flush  3 mL Intravenous Q12H  . sodium chloride flush  3 mL Intravenous Q12H  . vitamin B-12  100 mcg Oral Daily    Infusions: .  prismasol BGK 4/2.5 800 mL/hr at 03/21/19 0700  .  prismasol BGK 4/2.5 200 mL/hr at 03/20/19 1044  . sodium chloride 10 mL/hr at 03/14/19 1615  . heparin 1,500 Units/hr (03/21/19 1000)  . prismasol BGK 4/2.5 1,000 mL/hr at 03/21/19 0727    PRN Medications: sodium chloride, acetaminophen **OR** acetaminophen, heparin, heparin, lidocaine, ondansetron **OR** ondansetron (ZOFRAN) IV, oxyCODONE, sodium chloride, sodium chloride, sodium chloride flush    Assessment/Plan   1. Acute Hypoxic Respiratory Failure - 2/2 volume overload. Improved today. Off bipap. Now on [place] - CVP remains elevated at 19-20. Continue continuous CVVHD for volume removal.   2. 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  Last echo in 2/19 with EF 15-20%. She has a [employer] CRT-D device. Admitted with progressive dyspnea and 30 lb weight gain. Empirically started on dobutamine 2.5 due to concern for low output. RHC showed R>L heart failure with low PAPI adequate cardiac output on dobutamine.  She has started iHD, tolerates well.  Now off dobutamine.  Still volume overloaded with significant oxygen requirement. Minimal response to Lasix/metolazone. Now on CVVHD pulling -200.  Weight down 20 pounds in 2 days. CVP 13. Stil lwith marked edema in thighs and sacrum  - Continue CVVHD per nephrology. I d/w Dr. Joelyn Oms this am - Continue midodrine 10 mg tid with relative hypotension. SBP 110s today - Stopped digoxin and benazepril with AKI.  - Stopped beta blocker with concern for low output.  - No BP room for hydralazine/nitrates.  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  - LE u/s negative  for DVT 2. AKI on CKD stage 4: Baseline creatinine around 3, went up to 5 with BUN 150 this admission. Suspect cardiorenal syndrome superimposed on possible diabetic nephropathy. D/w Renal who feels she weill be dialysis-dependent moving forward - CVP 13 today. Still marrkedly edematous. Continue CVVHD today.  -Nephrology assistance appreciated.  3. Chronic atrial fibrillation: Warfarin held for procedures, now on heparin gtt. Long-term, will transition to apixaban with some concern for warfarin skin necrosis.    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  5. DM: Per primary.  6. Monoclonal gammopathy:  LV morphology does not look  consistent with AL amyloidosis. She has been seen by hematology, has had bone marrow bx showing 14% plasma cells by flow cytometry on the aspirate which is by definition multiple myeloma. Seen by heme/onc, plan for outpatient treatment.  7. Tricuspid regurgitation: Severe by echo.  8  Subcutaneous nodules: ?calciphylaxis versus less likely warfarin skin necrosis.  - Off warfarin, will use Eliquis in future.  - Bx negative for calciphylaxis. (? False negative)  - Renal managing for possible calciphylaxis.   Given severe HF, hypotension, progressive renal failure and multiple myeloma with very poor functional status I worry she will not be able to tolerate chemo and/or iHD for long. Short-term prognosis seems quite poor. Consider Palliative Care input.   CRITICAL CARE Performed by: Glori Bickers  Total critical care time: 35 minutes  Critical care time was exclusive of separately billable procedures and treating other patients.  Critical care was necessary to treat or prevent imminent or life-threatening deterioration.  Critical care was time spent personally by me (independent of midlevel providers or residents) on the following activities: development of treatment plan with patient and/or surrogate as well as nursing, discussions with consultants, evaluation of  patient's response to treatment, examination of patient, obtaining history from patient or surrogate, ordering and performing treatments and interventions, ordering and review of laboratory studies, ordering and review of radiographic studies, pulse oximetry and re-evaluation of patient's condition.    Length of Stay: [place], MD  03/21/2019, 10:58 AM  Advanced Heart Failure Team Pager [phone] (M-F; 7a - 4p)  Please contact [facility] for night-coverage after hours (4p -7a ) and weekends on [email or web address]

## 2019-03-21 NOTE — Progress Notes (Signed)
RT note: patient resting comfortably on nasal cannula at this time.  Bipap ordered for PRN.  Currently not indicated.  Will continue to monitor and assess for bipap needs.

## 2019-03-21 NOTE — Progress Notes (Signed)
ANTICOAGULATION CONSULT NOTE - [place] for heparin Indication: atrial fibrillation  Allergies  Allergen Reactions  . Wheat Swelling  . Latex Rash  . Penicillins Rash  . Sulfa Antibiotics Rash    Patient Measurements: Height: 5\' 6"  (167.6 cm) Weight: 156 lb 1.4 oz (70.8 kg) IBW/kg (Calculated) : 59.3  Vital Signs: Temp: 96.5 F (35.8 C) (01/24 0400) Temp Source: Axillary (01/24 0400) BP: 105/61 (01/24 0700) Pulse Rate: 114 (01/24 0700)  Labs: Recent Labs    03/19/19 0513 03/19/19 1655 03/20/19 0440 03/20/19 1600 03/21/19 0452  HGB 10.3*  --  9.9*  --  10.7*  HCT 31.3*  --  29.6*  --  32.0*  PLT 235  --  222  --  251  LABPROT 20.1*  --  22.6*  --  20.7*  INR 1.7*  --  2.0*  --  1.8*  HEPARINUNFRC 0.56  --  0.36  --  0.62  CREATININE 2.13*   < > 1.89* 1.83* 1.70*   < > = values in this interval not displayed.    Estimated Creatinine Clearance: 33.4 mL/min (A) (by C-G formula based on SCr of 1.7 mg/dL (H)).   Assessment: 59 yo female with hx AFib, Coumadin on hold, continue heparin bridge.   Heparin level therapeutic, H/H and pltc stable.  Goal of Therapy:  Heparin level goal 0.3-0.7 Monitor platelets by anticoagulation protocol: Yes   Plan:  -Continue heparin 1500 units/h -Daily heparin level and CBC   Arrie Senate, PharmD, BCPS Clinical Pharmacist [phone] Please check AMION for all [facility] numbers 03/21/2019

## 2019-03-22 DIAGNOSIS — R601 Generalized edema: Secondary | ICD-10-CM

## 2019-03-22 DIAGNOSIS — Z515 Encounter for palliative care: Secondary | ICD-10-CM

## 2019-03-22 DIAGNOSIS — L8990 Pressure ulcer of unspecified site, unspecified stage: Secondary | ICD-10-CM | POA: Insufficient documentation

## 2019-03-22 DIAGNOSIS — R531 Weakness: Secondary | ICD-10-CM

## 2019-03-22 DIAGNOSIS — Z7189 Other specified counseling: Secondary | ICD-10-CM

## 2019-03-22 LAB — PATHOLOGIST SMEAR REVIEW

## 2019-03-22 LAB — RENAL FUNCTION PANEL
Albumin: 2.9 g/dL — ABNORMAL LOW (ref 3.5–5.0)
Albumin: 2.7 g/dL — ABNORMAL LOW (ref 3.5–5.0)
Anion gap: 16 — ABNORMAL HIGH (ref 5–15)
Anion gap: 12 (ref 5–15)
BUN: 25 mg/dL — ABNORMAL HIGH (ref 6–20)
BUN: 35 mg/dL — ABNORMAL HIGH (ref 6–20)
CO2: 20 mmol/L — ABNORMAL LOW (ref 22–32)
CO2: 22 mmol/L (ref 22–32)
Calcium: 9.1 mg/dL (ref 8.9–10.3)
Calcium: 8.7 mg/dL — ABNORMAL LOW (ref 8.9–10.3)
Chloride: 93 mmol/L — ABNORMAL LOW (ref 98–111)
Chloride: 92 mmol/L — ABNORMAL LOW (ref 98–111)
Creatinine, Ser: 1.85 mg/dL — ABNORMAL HIGH (ref 0.44–1.00)
Creatinine, Ser: 2.23 mg/dL — ABNORMAL HIGH (ref 0.44–1.00)
GFR calc Af Amer: 34 mL/min — ABNORMAL LOW (ref >60)
GFR calc Af Amer: 27 mL/min — ABNORMAL LOW (ref >60)
GFR calc non Af Amer: 29 mL/min — ABNORMAL LOW (ref >60)
GFR calc non Af Amer: 23 mL/min — ABNORMAL LOW (ref >60)
Glucose, Bld: 194 mg/dL — ABNORMAL HIGH (ref 70–99)
Glucose, Bld: 257 mg/dL — ABNORMAL HIGH (ref 70–99)
Phosphorus: 2.9 mg/dL (ref 2.5–4.6)
Phosphorus: 3.1 mg/dL (ref 2.5–4.6)
Potassium: 5 mmol/L (ref 3.5–5.1)
Potassium: 4.8 mmol/L (ref 3.5–5.1)
Sodium: 129 mmol/L — ABNORMAL LOW (ref 135–145)
Sodium: 126 mmol/L — ABNORMAL LOW (ref 135–145)

## 2019-03-22 LAB — CBC
HCT: 34.8 % — ABNORMAL LOW (ref 36.0–46.0)
Hemoglobin: 11.5 g/dL — ABNORMAL LOW (ref 12.0–15.0)
MCH: 27.9 pg (ref 26.0–34.0)
MCHC: 33 g/dL (ref 30.0–36.0)
MCV: 84.5 fL (ref 80.0–100.0)
Platelets: 248 10*3/uL (ref 150–400)
RBC: 4.12 MIL/uL (ref 3.87–5.11)
RDW: 19.6 % — ABNORMAL HIGH (ref 11.5–15.5)
WBC: 16.1 10*3/uL — ABNORMAL HIGH (ref 4.0–10.5)
nRBC: 3.2 % — ABNORMAL HIGH (ref 0.0–0.2)

## 2019-03-22 LAB — GLUCOSE, CAPILLARY
Glucose-Capillary: 198 mg/dL — ABNORMAL HIGH (ref 70–99)
Glucose-Capillary: 252 mg/dL — ABNORMAL HIGH (ref 70–99)
Glucose-Capillary: 295 mg/dL — ABNORMAL HIGH (ref 70–99)
Glucose-Capillary: 268 mg/dL — ABNORMAL HIGH (ref 70–99)
Glucose-Capillary: 163 mg/dL — ABNORMAL HIGH (ref 70–99)

## 2019-03-22 LAB — MAGNESIUM: Magnesium: 2.9 mg/dL — ABNORMAL HIGH (ref 1.7–2.4)

## 2019-03-22 LAB — PROTIME-INR
INR: 1.6 — ABNORMAL HIGH (ref 0.8–1.2)
Prothrombin Time: 19.2 seconds — ABNORMAL HIGH (ref 11.4–15.2)

## 2019-03-22 LAB — HEPARIN LEVEL (UNFRACTIONATED)
Heparin Unfractionated: 0.75 IU/mL — ABNORMAL HIGH (ref 0.30–0.70)
Heparin Unfractionated: 0.43 IU/mL (ref 0.30–0.70)

## 2019-03-22 MED ORDER — SODIUM CHLORIDE 0.9% FLUSH: 10.0000 mL | INTRAVENOUS | Status: DC | PRN

## 2019-03-22 MED ORDER — SODIUM CHLORIDE 0.9% FLUSH
10.0000 mL | Freq: Two times a day (BID) | INTRAVENOUS | Status: DC
  Administered 2019-03-23: 10 mL
  Administered 2019-03-23: 20 mL
  Administered 2019-03-24 – 2019-03-25 (×2): 10 mL
  Administered 2019-03-26 – 2019-03-27 (×2): 20 mL
  Administered 2019-03-27 – 2019-03-28 (×3): 10 mL

## 2019-03-22 MED ORDER — SENNA 8.6 MG PO TABS
1.0000 | ORAL_TABLET | Freq: Every day | ORAL | Status: DC
  Administered 2019-03-22 – 2019-03-29 (×7): 8.6 mg via ORAL
  Filled 2019-03-22 (×9): qty 1

## 2019-03-22 NOTE — Progress Notes (Signed)
PULMONARY / CRITICAL CARE MEDICINE   NAME:  [name], MRN:  [id number], DOB:  Apr 16, 1960, LOS: 84 ADMISSION DATE:  03/07/2019, CONSULTATION DATE:   REFERRING MD:  Dr. 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   SIGNIFICANT PAST MEDICAL HISTORY   Automatic implantable cardioverter-defibrillator in situ, Chronic anticoagulation, Chronic atrial fibrillation (HCC), Chronic kidney disease, Chronic systolic CHF (congestive heart failure) ([place]), Congenital third degree heart block, Dizziness and giddiness (05/19/2012), Dysphagia (08/14/2011), GERD (gastroesophageal reflux disease), Helicobacter pylori gastritis (08/14/2011), IDDM (insulin dependent diabetes mellitus), Kidney stones (1990's), Nonischemic cardiomyopathy ([place]), Potassium (K) excess, and Stroke ([place]) (1999).  SIGNIFICANT EVENTS:  1/10 admitted to [facility] for acute on chronic heart failure exacerbation  1/20 punch biopsy done  1/21 transferred to icu for acute hypoxic respiratory failure, placed on bipap, on crrt 1/22 CT chest without pe, was on 13L HFNC, got bipap intermittently, on crrt  STUDIES:   TTE 1/12  1. Left ventricular ejection fraction, by visual estimation, is <20%. The left ventricle has severely decreased function. There is no left ventricular hypertrophy.  2. Definity contrast agent was given IV to delineate the left ventricular endocardial borders.  3. Abnormal septal motion consistent with left bundle branch block.  4. Left ventricular diastolic parameters are consistent with Grade III diastolic dysfunction (restrictive).  5. Moderately dilated left ventricular internal cavity size.  6. The left ventricle  demonstrates global hypokinesis.  7. Global right ventricle has mildly reduced systolic function.The right ventricular size is severely enlarged. No increase in right ventricular wall thickness.  8. Left atrial size was severely dilated.  9. Right atrial size was severely dilated. 10. Trivial pericardial effusion is present. 11. The mitral valve is normal in structure. No evidence of mitral valve regurgitation. 12. The tricuspid valve is normal in structure. 13. The aortic valve is normal in structure. Aortic valve regurgitation is not visualized. Mild aortic valve sclerosis without stenosis. 14. Pulmonic regurgitation is moderate. 15. The pulmonic valve was normal in structure. Pulmonic valve regurgitation is moderate. 16. Mildly dilated pulmonary artery. 17. Mildly elevated pulmonary artery systolic pressure. 18. The tricuspid regurgitant velocity is 2.32 m/s, and with an assumed right atrial pressure of 15 mmHg, the estimated right ventricular systolic pressure is mildly elevated at 36.6 mmHg. 19. A pacer wire is visualized. 20. The inferior vena cava is dilated in size with <50% respiratory variability, suggesting right atrial pressure of 15 mmHg. 21. No intracardiac thrombi or masses were visualized. 22. No significant change from prior study (April 10, 2017). 23. Prior images reviewed side by side  Right heart cath 03/09/2019 1. Right > left heart failure with low PAPI.  2. Pulmonary venous hypertension.  3. Adequate cardiac output on dobutamine 2.5 mcg/kg/min  CT chest 1/22 IMPRESSION: 1. Moderate cardiomegaly.  One vessel coronary atherosclerosis. 2. Dilated main pulmonary artery, suggesting pulmonary arterial hypertension. 3. Mild platelike scarring versus atelectasis at the left lung base. Otherwise no active pulmonary disease. No pleural effusions. 4. Nonspecific mild left prevascular mediastinal lymphadenopathy. 5. Ectatic 4.4 cm ascending thoracic aorta. Recommend  annual imaging followup by CTA or MRA. This recommendation follows 2010 ACCF/AHA/AATS/ACR/ASA/SCA/SCAI/SIR/STS/SVM Guidelines for the Diagnosis and Management of Patients with Thoracic  Aortic Disease. Circulation. 2010; 121: D149-F026. Aortic aneurysm NOS (ICD10-I71.9). 6. Finely irregular liver surface, cannot exclude cirrhosis.  Aortic Atherosclerosis (ICD10-I70.0).  CULTURES:   MRSA PCR 1/11 negative  Respiratory viral panel 1/10 neg covid, neg flu a and b   ANTIBIOTICS:    LINES/TUBES:  Peripheral IV right hand 1/15 Peripheral Iv left forearm 1/17  CONSULTANTS:  Nephrology Heart Failure  Surgery  SUBJECTIVE:  Patient states that she has severe pain in her bilateral lower extremities and abdomen. Husband who is a Theme park manager at [employer] is at bedside.   CONSTITUTIONAL: BP (!) 94/58   Pulse (!) 59   Temp 97.6 F (36.4 C) (Axillary)   Resp 17   Ht _0  (1.676 m)   Wt 68.3 kg   SpO2 99%   BMI 24.30 kg/m   I/O last 3 completed shifts: In: 2864.4 [P.O.:2340; I.V.:524.4] Out: 8040 [Other:8040]  CVP:  [7 mmHg-17 mmHg] 8 mmHg  PHYSICAL EXAM: General:  Laying in bed, appears comfortable Neuro: alert, oriented, able to respond to questions  HEENT: 3L via [place]  Cardiovascular: rrr, s1 and s2 audible Lungs: ctab, no rales or rhonchi Abdomen: soft, nondistended  Musculoskeletal: no pitting edema Skin: warm extremities   ASSESSMENT AND PLAN    Acute hypoxemic respiratory failure Acute respiratory alkalosis Thought to be secondary to pulmonary edema. Patient continues to be on CVVHD. Is -2.7L over the past 24 hrs. Weight 156>150lbs. Currently on 3L [place].   -Will continue to monitor respiratory needs  -continue CRRT per nephrology  Acute on chronic systolic heart failure, VZ<85% Longstanding NICM [employer] CRT-D Getting CVVHD, tolerating well. Hemodynamically stable not on any pressors.   -follow cvp -continue midodrine 82m tid, consider dose when on  dialysis  -Continue cvvhd -cannot use digoxin, acei/arb due to aki  -cannot use b blocker due to concern for low output  -hypotensive so cannot do nitrates/hydralazine  Hypervolemic Hyponatremia Na 129.   Continue crrt  Leukocytosis  Leukocytosis persistently present. WBC 16, afebrile. Likely stress.  Acute on chronic renal failure Cardiorenal syndrome ESRD Thought to be secondary to diabetes mellitus, cardiorenal syndrome, MGUS. [facility]   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  Protein electrophoresis showing M spike 1.2%, albumin 2.6. Bone marrow biopsy showing 14% plasma cells. Oncology plans to do chemotherapy outpatient.   -appreciate oncology following   Chronic atrial fibrillation  Rates ranging 50-70s.   -continue heparin   Diabetes Mellitus  Patient's blood sugars have been ranging 190-200s. 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 Punch biopsy done by general surgery 1/20 resulted no morphological evidence of calciphylaxis. 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   -follow up pathology  -stop warfarin -gabapentin 3061mbid   Abdominal tenderness Constipation  CT abdomen done 1/11 does not show any acute abnormality. Has been having regular bowel movements on laxatives.   Patient has large stool burden, has not had bm in 4 days.   -miralax bid  -senna qd -please document bm  Left Renal Cyst  1.5cm in size simple exophytic seen on CT chest 1/22  Ectatic ascending thoracic aorta  4.4cm in size.   -annual  follow up  Best Practice / Goals of Care / Disposition.   DVT  PROPHYLAXIS: heparin SUP: n/a NUTRITION: renal diet MOBILITY: bed rest GOALS OF CARE: Full code FAMILY DISCUSSIONS: Updated patient and husband at bedside DISPOSITION ICU  LABS  Glucose Recent Labs  Lab 03/20/19 1602 03/20/19 2132 03/21/19 0653 03/21/19 1215 03/21/19 1547 03/21/19 2124  GLUCAP 172* 175* 159* 252* 238* 201*    BMET Recent Labs  Lab 03/21/19 0452 03/21/19 1603 03/22/19 0500  NA 130* 130* 129*  K 4.9 4.6 5.0  CL 94* 95* 93*  CO2 21* 21* 20*  BUN 25* 25* 25*  CREATININE 1.70* 1.74* 1.85*  GLUCOSE 174* 228* 194*    Liver Enzymes Recent Labs  Lab 03/21/19 0452 03/21/19 1603 03/22/19 0500  ALBUMIN 2.8* 2.9* 2.9*    Electrolytes Recent Labs  Lab 03/20/19 0440 03/20/19 1600 03/21/19 0452 03/21/19 1603 03/22/19 0500  CALCIUM 8.8*   < > 9.1 8.9 9.1  MG 2.5*  --  2.6*  --  2.9*  PHOS 2.7   < > 2.7 2.6 2.9   < > = values in this interval not displayed.    CBC Recent Labs  Lab 03/20/19 0440 03/21/19 0452 03/22/19 0500  WBC 13.5* 15.9* 16.1*  HGB 9.9* 10.7* 11.5*  HCT 29.6* 32.0* 34.8*  PLT 222 251 248    ABG Recent Labs  Lab 03/18/19 1030 03/18/19 1327  PHART 7.463* 7.431  PCO2ART 29.9* 36.2  PO2ART 52.0* 131.0*    Coag's Recent Labs  Lab 03/19/19 0513 03/20/19 0440 03/21/19 0452  INR 1.7* 2.0* 1.8*    Sepsis Markers No results for input(s): LATICACIDVEN, PROCALCITON, O2SATVEN in the last 168 hours.  Cardiac Enzymes No results for input(s): TROPONINI, PROBNP in the last 168 hours.  REVIEW OF SYSTEMS:    Patient states that breathing has improved from yesterday. Has breast tenderness and lower extremity pain.

## 2019-03-22 NOTE — Progress Notes (Signed)
ANTICOAGULATION CONSULT NOTE - [place] for heparin Indication: atrial fibrillation  Allergies  Allergen Reactions  . Wheat Swelling  . Latex Rash  . Penicillins Rash  . Sulfa Antibiotics Rash    Patient Measurements: Height: 5\' 6"  (167.6 cm) Weight: 150 lb 9.2 oz (68.3 kg) IBW/kg (Calculated) : 59.3  Vital Signs: Temp: 96.9 F (36.1 C) (01/25 1517) Temp Source: Axillary (01/25 1517) BP: 94/60 (01/25 1900) Pulse Rate: 59 (01/25 1900)  Labs: Recent Labs    03/20/19 0440 03/20/19 1600 03/21/19 0452 03/21/19 0452 03/21/19 1603 03/22/19 0500 03/22/19 1700  HGB 9.9*  --  10.7*  --   --  11.5*  --   HCT 29.6*  --  32.0*  --   --  34.8*  --   PLT 222  --  251  --   --  248  --   LABPROT 22.6*  --  20.7*  --   --  19.2*  --   INR 2.0*  --  1.8*  --   --  1.6*  --   HEPARINUNFRC 0.36  --  0.62  --   --  0.75* 0.43  CREATININE 1.89*   < > 1.70*   < > 1.74* 1.85* 2.23*   < > = values in this interval not displayed.    Estimated Creatinine Clearance: 25.4 mL/min (A) (by C-G formula based on SCr of 2.23 mg/dL (H)).   Assessment: 59 yo female with hx AFib, warfarin on hold; continue heparin bridge (per Cardiology, plan to transition to apixaban long term, due to concern for warfarin skin necrosis).  Heparin level ~8 hrs after heparin infusion was decreased to 1350 units/hr is 0.43 units/ml, which is within the desired goal range for this pt. H/H 11.5/34.8, platelets 248 (CBC fairly stable). Per RN, no issues with IV or bleeding observed.  Goal of Therapy:  Heparin level goal 0.3-0.7 Monitor platelets by anticoagulation protocol: Yes   Plan:  Continue IV heparin at 1350 units/hr Check confirmatory heparin level in 8 hrs Monitor daily heparin level, CBC Monitor for signs/symptoms of bleeding  Gillermina Hu, PharmD, BCPS, [facility] Clinical Pharmacist 03/22/2019 7:38 PM

## 2019-03-22 NOTE — Consult Note (Signed)
                                                                                 Consultation Note Date: 03/22/2019   Patient Name: [name]  DOB: 11/08/1960  MRN: [id number]  Age / Sex: 58 y.o., female  PCP: Fanta, Tesfaye, MD Referring Physician: Agarwala, Ravi, MD  Reason for Consultation: Establishing goals of care and Psychosocial/spiritual support  HPI/Patient Profile: 58 y.o. female  admitted on 03/07/2019 with history of chronic atrial fibrillation on anticoagulation with Coumadin and complete heart block s/p pacemaker placement, upgrade to BiV ICD 5 years ago, chronic systolic CHF/EF 15 to 20%, thrombocytopenia, liver cirrhosis, who came to hospital with complaints of increased swelling in her legs and abdomen.  She has gained 3 kg since 03/03/19.  She does complain of orthopnea and PND  Lab work showed in ED/ creatinine 5.04, sodium 127.  BNP 1072.0. Cardiology and nephrology consulted.  Currently with intermittent CRRT for fluid removal Concern the progression of renal disease could be secondary to multiple myeloma.  Patient and family face treatment option decisions, advanced directive decisions and anticipatory care needs  Clinical Assessment and Goals of Care:    This NP Mary Larach reviewed medical records, received report from team, assessed the patient and then meet at the patient's bedside along with her husband to discuss diagnosis, prognosis, GOC, EOL wishes disposition and options.  Concept of Palliative Care was discussed  A detailed discussion was had today regarding advanced directives.  Concepts specific to code status, artifical feeding and hydration, continued IV antibiotics and rehospitalization was had.  The difference between a aggressive medical intervention path  and a palliative comfort care path for this patient at this time was had.  Values and goals of care important to patient and family were attempted to be elicited.  Initially the patient was  surprised by the conversation.  "Do you really think I am that bad, for this conversation?"  She verbalizes her strong faith and that she will wait for God to tell her where she is and what she should do.  Dr. Webb also was at the bedside and offered input into her overall medical condition.  We discussed human mortality and the limitations of medical interventions to prolong quality of life when a body fails to thrive.  Shortly after the conversation she and her husband asked to have a family meeting tomorrow at 10:00 for continued goals of care meeting,  with family.   MOST form introduced.   A hard choices booklet was left for review   Questions and concerns addressed.   Family encouraged to call with questions or concerns.    PMT will continue to support holistically.     No documented healthcare power of attorney or advanced directive at this time  NEXT OF KIN/patient's husband is main support person and decision maker in the event that the patient cannot make her own decisions.    SUMMARY OF RECOMMENDATIONS    Code Status/Advance Care Planning:  Full code   Encourage patient to consider DNR/DNI status understanding poor outcomes with  resuscitative attempts in similar patients.   Palliative Prophylaxis:     Aspiration, Bowel Regimen and Frequent Pain Assessment  Additional Recommendations (Limitations, Scope, Preferences):  Full Scope Treatment  Psycho-social/Spiritual:   Desire for further Chaplaincy support:yes they are involved and--patient's husband is a chaplain at [facility]  Additional Recommendations: Created space and opportunity for patient and her husband to explore their thoughts and feelings regarding current medical situation.  It was difficult for them both to hear the seriousness of the current medical situation.  Until today they "really had not thought about all this"   Emotional support offered  Prognosis:   Prognosis is poor and prognosis  is dependent on course of this hospitalization and desire for life prolonging measures  Discharge Planning: To Be Determined      Primary Diagnoses: Present on Admission: . Acute on chronic systolic CHF (congestive heart failure), NYHA class 4 (HCC) . 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 Thrombocytopenia (HCC) . Hepatic cirrhosis (HCC) . ATRIAL FIBRILLATION, CHRONIC   I have reviewed the medical record, interviewed the patient and family, and examined the patient. The following aspects are pertinent.  Past Medical History:  Diagnosis Date  . Automatic implantable cardioverter-defibrillator in situ    a. 03/2013: BSX Energen CRTD BiV ICC, ser# [id number]  . Chronic anticoagulation    a. coumadin.  . Chronic atrial fibrillation (HCC)    embolic rind  . Chronic kidney disease    creatinine- 1.8 in 09/2006 and 2.0 in 05/2007; 2.05 in 2011, 2.29 in 2012  . Chronic systolic CHF (congestive heart failure) (HCC)    a. 03/2013 Echo: Sev LV dysfxn with sev diff HK, restrictive phys, diast dysfxn, mild MR, sev dil LA/RA, mod PR, PASP 50mmHg.  . Congenital third degree heart block    a. Guidant VVI pacemaker implanted in 09/1997; b. gen change in 01/2004;  c. 03/2013 upgrade to BSX Energen CRTD BiV ICC, ser# [id number].  . Dizziness and giddiness 05/19/2012  . Dysphagia 08/14/2011   FEB 2013 EGD/DIL 16 MM; GERD; h/o gastroesophageal reflux disease; + H. Pylori gastritis   . GERD (gastroesophageal reflux disease)   . Helicobacter pylori gastritis 08/14/2011  . IDDM (insulin dependent diabetes mellitus)   . Kidney stones 1990's  . Nonischemic cardiomyopathy (HCC)    a. 03/2013 Echo: Sev LV dysfxn with sev diff HK, restrictive phys, diast dysfxn, mild MR, sev dil LA/RA, mod PR, PASP 50mmHg;  b. 03/2013: BSX Energen CRTD BiV ICC, ser# [id number].  . Potassium (K) excess   . Stroke (HCC) 1999   denies residual on 04/21/2013   Social History   Socioeconomic History  . Marital  status: Married    Spouse name: Not on file  . Number of children: 4  . Years of education: Not on file  . Highest education level: Not on file  Occupational History    Employer: UNEMPLOYED  Tobacco Use  . Smoking status: Never Smoker  . Smokeless tobacco: Never Used  . Tobacco comment: Never smoked  Substance and Sexual Activity  . Alcohol use: No    Alcohol/week: 0.0 standard drinks  . Drug use: No  . Sexual activity: Not Currently    Birth control/protection: Post-menopausal  Other Topics Concern  . Not on file  Social History Narrative  . Not on file   Social Determinants of Health   Financial Resource Strain:   . Difficulty of Paying Living Expenses: Not on file  Food Insecurity:   . Worried About Running Out of Food in the Last Year:   Not on file  . Ran Out of Food in the Last Year: Not on file  Transportation Needs:   . Lack of Transportation (Medical): Not on file  . Lack of Transportation (Non-Medical): Not on file  Physical Activity:   . Days of Exercise per Week: Not on file  . Minutes of Exercise per Session: Not on file  Stress:   . Feeling of Stress : Not on file  Social Connections:   . Frequency of Communication with Friends and Family: Not on file  . Frequency of Social Gatherings with Friends and Family: Not on file  . Attends Religious Services: Not on file  . Active Member of Clubs or Organizations: Not on file  . Attends Club or Organization Meetings: Not on file  . Marital Status: Not on file   Family History  Adopted: Yes  Problem Relation Age of Onset  . Autism Son   . Seizures Son   . Diabetes Daughter   . Colon cancer Neg Hx   . Colon polyps Neg Hx    Scheduled Meds: . allopurinol  150 mg Oral q morning - 10a  . Chlorhexidine Gluconate Cloth  6 each Topical Q0600  . gabapentin  300 mg Oral BID  . insulin aspart  0-9 Units Subcutaneous TID WC  . lidocaine (PF)  5 mL Other Once  . lidocaine  10 mL Intradermal Once  . midodrine  10 mg  Oral TID WC  . polyethylene glycol  17 g Oral BID  . senna  1 tablet Oral Daily  . sodium chloride flush  3 mL Intravenous Q12H  . sodium chloride flush  3 mL Intravenous Q12H  . vitamin B-12  100 mcg Oral Daily   Continuous Infusions: .  prismasol BGK 4/2.5 800 mL/hr at 03/21/19 2130  .  prismasol BGK 4/2.5 200 mL/hr at 03/21/19 1600  . sodium chloride 10 mL/hr at 03/14/19 1615  . heparin 1,350 Units/hr (03/22/19 0900)  . prismasol BGK 4/2.5 1,000 mL/hr at 03/21/19 2300   PRN Meds:.sodium chloride, acetaminophen **OR** acetaminophen, heparin, heparin, lidocaine, ondansetron **OR** ondansetron (ZOFRAN) IV, oxyCODONE, sodium chloride, sodium chloride, sodium chloride flush Medications Prior to Admission:  Prior to Admission medications   Medication Sig Start Date End Date Taking? Authorizing Provider  ACCU-CHEK GUIDE test strip 1 each by Other route 4 (four) times daily.  02/25/19  Yes [provider]  acetaminophen (TYLENOL) 500 MG tablet Take 500 mg by mouth every 6 (six) hours as needed for moderate pain.   Yes [provider]  Alcohol Swabs (ALCOHOL PREP) PADS 1 each by Does not apply route daily. 02/11/18  Yes Kumar, Ajay, MD  allopurinol (ZYLOPRIM) 100 MG tablet Take 150 mg by mouth every morning. Reported on 06/15/2015   Yes [provider]  benazepril (LOTENSIN) 10 MG tablet Take 10 mg by mouth daily.   Yes [provider]  calcitRIOL (ROCALTROL) 0.25 MCG capsule Take 0.5 mcg by mouth daily. Take 2 tablets by mouth once daily.   Yes [provider]  cholecalciferol (VITAMIN D3) 25 MCG (1000 UT) tablet Take 1,000 Units by mouth daily.   Yes [provider]  COREG CR 40 MG 24 hr capsule TAKE ONE CAPSULE BY MOUTH DAILY. 11/24/17  Yes Taylor, Gregg W, MD  digoxin (LANOXIN) 0.125 MG tablet TAKE 1 TABLET BY MOUTH DAILY EXCEPT ON SATURDAY AND SUNDAY. Patient taking differently: Take 0.125 mg by mouth daily. Except on Saturday and Sunday  11/27/18  Yes   Koneswaran, Suresh A, MD  insulin NPH Human (HUMULIN N) 100 UNIT/ML injection Inject 0.34 mLs (34 Units total) into the skin daily. Patient taking differently: Inject 22 Units into the skin daily.  07/22/18  Yes Kumar, Ajay, MD  Insulin Pen Needle 31G X 5 MM MISC 1 each by Does not apply route daily. Use as instructed to check blood sugar 4 times daily. 02/11/18  Yes Kumar, Ajay, MD  Insulin Syringe-Needle U-100 30G 1 ML MISC 1 each by Does not apply route daily. Use needle to inject insulin 3 times daily. 02/13/18  Yes Kumar, Ajay, MD  LITETOUCH PEN NEEDLES 31G X 8 MM MISC USE AS DIRECTED UP TO 4 TIMES DAILY. Patient taking differently: See admin instructions.  01/27/19  Yes Kumar, Ajay, MD  meclizine (ANTIVERT) 12.5 MG tablet Take 12.5 mg by mouth 3 (three) times daily as needed. 02/11/19  Yes [provider]  metolazone (ZAROXOLYN) 2.5 MG tablet Take 2.5 mg by mouth daily as needed. 02/01/19  Yes [provider]  NOVOLOG FLEXPEN 100 UNIT/ML FlexPen INJECT SUBCUTANEOUSLY 12 UNITS BEFORE BREAKFAST; 14 UNITS AT LUNCH; AND 16 UNITS AT DINNER. Patient taking differently: See admin instructions. INJECT SUBCUTANEOUSLY 12 UNITS BEFORE BREAKFAST; 14 UNITS AT LUNCH; AND 16 UNITS AT DINNER 01/27/19  Yes Kumar, Ajay, MD  SURE COMFORT INSULIN SYRINGE 31G X 5/16" 0.5 ML MISC INJECT ONCE DAILY AS DIRECTED. Patient taking differently: See admin instructions. INJECT ONCE DAILY AS DIRECTED. 01/27/19  Yes Kumar, Ajay, MD  torsemide (DEMADEX) 20 MG tablet Take 1 tablet (20 mg total) by mouth 2 (two) times daily. 03/05/19 06/03/19 Yes Koneswaran, Suresh A, MD  traMADol (ULTRAM) 50 MG tablet Take 1 tablet (50 mg total) by mouth 2 (two) times daily as needed. 03/03/19  Yes Katragadda, Sreedhar, MD  TRESIBA FLEXTOUCH 200 UNIT/ML SOPN INJECT 56 UNITS INTO THE SKIN DAILY BEFORE BREAKFAST. Patient taking differently: Inject 56 Units into the skin daily before breakfast.  10/25/18  Yes Kumar, Ajay, MD    VICTOZA 18 MG/3ML SOPN INJECT 1.2MG SUBCUTANEOUSLY DAILY AS DIRECTED. Patient taking differently: Inject 1.2 mg into the skin daily. INJECT 1.2MG SUBCUTANEOUSLY DAILY AS DIRECTED. 01/27/19  Yes Kumar, Ajay, MD  warfarin (COUMADIN) 5 MG tablet TAKE 1/2 DAILY EXCEPT ON WEDNESDAY TAKE 1 TABLET. Patient taking differently: Take 2.5 mg by mouth daily. TAKE 1/2 DAILY. 06/30/18  Yes Koneswaran, Suresh A, MD  diclofenac Sodium (VOLTAREN) 1 % GEL APPLY TO AFFECTED AREASETWICE DAILY. 02/23/19   [provider]   Allergies  Allergen Reactions  . Wheat Swelling  . Latex Rash  . Penicillins Rash  . Sulfa Antibiotics Rash   Review of Systems  Neurological: Positive for weakness.    Physical Exam Constitutional:      Appearance: She is underweight. She is ill-appearing.  Cardiovascular:     Rate and Rhythm: Normal rate.  Musculoskeletal:     Right lower leg: 2+ Pitting Edema present.     Left lower leg: 2+ Pitting Edema present.     Comments: Generalized weakness and muscle atrophy  Skin:    General: Skin is warm and dry.  Neurological:     Mental Status: She is lethargic.     Vital Signs: BP 98/62   Pulse 60   Temp (!) 97.4 F (36.3 C) (Oral)   Resp (!) 27   Ht 5' 6" (1.676 m)   Wt 68.3 kg   SpO2 99%   BMI 24.30 kg/m  Pain Scale: 0-10 POSS *See   Group Information*: S-Acceptable,Sleep, easy to arouse Pain Score: Asleep   SpO2: SpO2: 99 % O2 Device:SpO2: 99 % O2 Flow Rate: .O2 Flow Rate (L/min): 3 L/min  IO: Intake/output summary:   Intake/Output Summary (Last 24 hours) at 03/22/2019 0946 Last data filed at 03/22/2019 0900 Gross per 24 hour  Intake 2459.23 ml  Output 5079 ml  Net -2619.77 ml    LBM: Last BM Date: 03/20/19 Baseline Weight: Weight: 90.7 kg Most recent weight: Weight: 68.3 kg     Palliative Assessment/Data: 30 % at best   Discussed with Dr. Suezanne Jacquet simhon and bedside RN and Dr Justin Mend  Time In: 1345 Time Out: 1500 Time Total: 75 minutes Greater  than 50%  of this time was spent counseling and coordinating care related to the above assessment and plan.  Signed by: Wadie Lessen, NP   Please contact Palliative Medicine Team phone at [phone] for questions and concerns.  For individual provider: See Shea Evans

## 2019-03-22 NOTE — Progress Notes (Signed)
ANTICOAGULATION CONSULT NOTE - [place] for heparin Indication: atrial fibrillation  Allergies  Allergen Reactions  . Wheat Swelling  . Latex Rash  . Penicillins Rash  . Sulfa Antibiotics Rash    Patient Measurements: Height: 5\' 6"  (167.6 cm) Weight: 150 lb 9.2 oz (68.3 kg) IBW/kg (Calculated) : 59.3  Vital Signs: Temp: 97.3 F (36.3 C) (01/25 1145) Temp Source: Oral (01/25 1145) BP: 84/69 (01/25 1200) Pulse Rate: 59 (01/25 1145)  Labs: Recent Labs    03/20/19 0440 03/20/19 1600 03/21/19 0452 03/21/19 1603 03/22/19 0500  HGB 9.9*  --  10.7*  --  11.5*  HCT 29.6*  --  32.0*  --  34.8*  PLT 222  --  251  --  248  LABPROT 22.6*  --  20.7*  --  19.2*  INR 2.0*  --  1.8*  --  1.6*  HEPARINUNFRC 0.36  --  0.62  --  0.75*  CREATININE 1.89*   < > 1.70* 1.74* 1.85*   < > = values in this interval not displayed.    Estimated Creatinine Clearance: 30.7 mL/min (A) (by C-G formula based on SCr of 1.85 mg/dL (H)).   Assessment: 59 yo female with hx AFib, Coumadin on hold, continue heparin bridge.   Heparin level above goal today.  No overt bleeding or complications noted.  CBC fairly stable.  Goal of Therapy:  Heparin level goal 0.3-0.7 Monitor platelets by anticoagulation protocol: Yes   Plan:  Decrease IV heparin to 1350 units/hr. Recheck heparin level in 8 hrs. Daily heparin level and CBC.  Marguerite Olea, [facility] Clinical Pharmacist Phone [phone]  03/22/2019 12:48 PM

## 2019-03-22 NOTE — Progress Notes (Signed)
[facility] ROUNDING NOTE   Subjective:   Is a 59 year old lady that was admitted 03/08/2019 with a history of chronic atrial fibrillation complete heart block status post pacemaker placement chronic systolic congestive heart failure with ejection fraction of 15 to 20% liver cirrhosis admitted with increasing swelling of legs and abdomen and a 30 pound weight gain.  She was found to have acute on chronic biventricular heart failure.  She was treated with CRRT with removal of 200 cc an hour.  There is concern for cardiac amyloid this is being evaluated by cardiology bone marrow 03/11/2018 showed 14% plasma cells consistent with multiple myeloma  but LV morphology did not appear consistent with AL amyloid.  She is no longer on warfarin due to concern for warfarin skin necrosis.  She was on IV heparin and there was some talk about transitioning to apixaban.  Blood pressure 96/58 pulse 62 temperature 97.6 O2 sats 100% CVP 8  Sodium 129 potassium 5.0 chloride 93 CO2 20 BUN 25 creatinine 1.85 glucose 194 calcium 9.1 phosphorus 2.9 magnesium 2.9 albumin 2.9 WBC 16.1 hemoglobin 11.5 platelets 248  Allopurinol 150 mg daily, Neurontin 300 mg twice daily insulin sliding scale, midodrine 10 mg 3 times daily  Objective:  Vital signs in last 24 hours:  Temp:  [97.3 F (36.3 C)-98.3 F (36.8 C)] 97.6 F (36.4 C) (01/25 0640) Pulse Rate:  [58-118] 59 (01/25 0730) Resp:  [12-28] 24 (01/25 0730) BP: (93-122)/(53-97) 96/58 (01/25 0730) SpO2:  [90 %-100 %] 100 % (01/25 0730) Weight:  [68.3 kg] 68.3 kg (01/25 0500)  Weight change: -2.5 kg Filed Weights   03/20/19 0500 03/21/19 0500 03/22/19 0500  Weight: 75.3 kg 70.8 kg 68.3 kg    Intake/Output: I/O last 3 completed shifts: In: 2879.4 [P.O.:2340; I.V.:539.4] Out: 8040 [Other:8040]   Intake/Output this shift:  Total I/O In: 360 [P.O.:360] Out: -  Alert nondistressed CVS- RRR JVP to jaw right IJ tunneled dialysis catheter 2/6 systolic  murmur RS-clear to auscultation ABD- BS present soft non-distended EXT-lower extremity edema noted but tender indurated firm lesions noted on inner thighs right and left legs   Basic Metabolic Panel: Recent Labs  Lab 03/19/19 0513 03/19/19 1655 03/20/19 0440 03/20/19 0440 03/20/19 1600 03/20/19 1600 03/21/19 0452 03/21/19 1603 03/22/19 0500  NA 134*   < > 133*  --  131*  --  130* 130* 129*  K 4.4   < > 4.6  --  4.6  --  4.9 4.6 5.0  CL 96*   < > 98  --  95*  --  94* 95* 93*  CO2 23   < > 22  --  20*  --  21* 21* 20*  GLUCOSE 180*   < > 176*  --  214*  --  174* 228* 194*  BUN 26*   < > 25*  --  24*  --  25* 25* 25*  CREATININE 2.13*   < > 1.89*  --  1.83*  --  1.70* 1.74* 1.85*  CALCIUM 8.8*   < > 8.8*   < > 9.0   < > 9.1 8.9 9.1  MG 2.3  --  2.5*  --   --   --  2.6*  --  2.9*  PHOS 3.4   < > 2.7  --  2.6  --  2.7 2.6 2.9   < > = values in this interval not displayed.    Liver Function Tests: Recent Labs  Lab 03/20/19 0440 03/20/19  1600 03/21/19 0452 03/21/19 1603 03/22/19 0500  ALBUMIN 2.6* 2.9* 2.8* 2.9* 2.9*   No results for input(s): LIPASE, AMYLASE in the last 168 hours. No results for input(s): AMMONIA in the last 168 hours.  CBC: Recent Labs  Lab 03/18/19 0247 03/18/19 1030 03/18/19 1327 03/19/19 0513 03/20/19 0440 03/21/19 0452 03/22/19 0500  WBC 14.6*  --   --  14.6* 13.5* 15.9* 16.1*  HGB 9.2*   < > 10.9* 10.3* 9.9* 10.7* 11.5*  HCT 27.3*   < > 32.0* 31.3* 29.6* 32.0* 34.8*  MCV 82.0  --   --  84.1 82.9 82.3 84.5  PLT 214  --   --  235 222 251 248   < > = values in this interval not displayed.    Cardiac Enzymes: No results for input(s): CKTOTAL, CKMB, CKMBINDEX, TROPONINI in the last 168 hours.  BNP: Invalid input(s): POCBNP  CBG: Recent Labs  Lab 03/21/19 0653 03/21/19 1215 03/21/19 1547 03/21/19 2124 03/22/19 0636  GLUCAP 159* 252* 238* 201* 198*    Microbiology: Results for orders placed or performed during the hospital  encounter of 03/07/19  Respiratory Panel by RT PCR (Flu A&B, Covid) - Nasopharyngeal Swab     Status: None   Collection Time: 03/07/19  9:21 PM   Specimen: Nasopharyngeal Swab  Result Value Ref Range Status   SARS Coronavirus 2 by RT PCR NEGATIVE NEGATIVE Final    Comment: (NOTE) SARS-CoV-2 target nucleic acids are NOT DETECTED. The SARS-CoV-2 RNA is generally detectable in upper respiratoy specimens during the acute phase of infection. The lowest concentration of SARS-CoV-2 viral copies this assay can detect is 131 copies/mL. A negative result does not preclude SARS-Cov-2 infection and should not be used as the sole basis for treatment or other patient management decisions. A negative result may occur with  improper specimen collection/handling, submission of specimen other than nasopharyngeal swab, presence of viral mutation(s) within the areas targeted by this assay, and inadequate number of viral copies (<131 copies/mL). A negative result must be combined with clinical observations, patient history, and epidemiological information. The expected result is Negative. Fact Sheet for Patients:  PinkCheek.be Fact Sheet for Healthcare Providers:  GravelBags.it This test is not yet ap proved or cleared by the [place] FDA and  has been authorized for detection and/or diagnosis of SARS-CoV-2 by FDA under an Emergency Use Authorization (EUA). This EUA will remain  in effect (meaning this test can be used) for the duration of the COVID-19 declaration under Section 564(b)(1) of the Act, 21 U.S.C. section 360bbb-3(b)(1), unless the authorization is terminated or revoked sooner.    Influenza A by PCR NEGATIVE NEGATIVE Final   Influenza B by PCR NEGATIVE NEGATIVE Final    Comment: (NOTE) The Xpert Xpress SARS-CoV-2/FLU/RSV assay is intended as an aid in  the diagnosis of influenza from Nasopharyngeal swab specimens and  should  not be used as a sole basis for treatment. Nasal washings and  aspirates are unacceptable for Xpert Xpress SARS-CoV-2/FLU/RSV  testing. Fact Sheet for Patients: PinkCheek.be Fact Sheet for Healthcare Providers: GravelBags.it This test is not yet approved or cleared by the [place] FDA and  has been authorized for detection and/or diagnosis of SARS-CoV-2 by  FDA under an Emergency Use Authorization (EUA). This EUA will remain  in effect (meaning this test can be used) for the duration of the  Covid-19 declaration under Section 564(b)(1) of the Act, 21  U.S.C. section 360bbb-3(b)(1), unless the authorization is  terminated or  revoked. Performed at [facility], [address] [place], [place] [address]   MRSA PCR Screening     Status: None   Collection Time: 03/08/19 10:26 PM   Specimen: Nasopharyngeal  Result Value Ref Range Status   MRSA by PCR NEGATIVE NEGATIVE Final    Comment:        The GeneXpert MRSA Assay (FDA approved for NASAL specimens only), is one component of a comprehensive MRSA colonization surveillance program. It is not intended to diagnose MRSA infection nor to guide or monitor treatment for MRSA infections. Performed at [facility], [place] [address], [place], [place] [address]    *Note: Due to a large number of results and/or encounters for the requested time period, some results have not been displayed. A complete set of results can be found in Results Review.    Coagulation Studies: Recent Labs    03/20/19 0440 03/21/19 0452  LABPROT 22.6* 20.7*  INR 2.0* 1.8*    Urinalysis: No results for input(s): COLORURINE, LABSPEC, PHURINE, GLUCOSEU, HGBUR, BILIRUBINUR, KETONESUR, PROTEINUR, UROBILINOGEN, NITRITE, LEUKOCYTESUR in the last 72 hours.  Invalid input(s): APPERANCEUR    Imaging: No results found.   Medications:   .  prismasol BGK 4/2.5 800 mL/hr at 03/21/19 2130  .   prismasol BGK 4/2.5 200 mL/hr at 03/21/19 1600  . sodium chloride 10 mL/hr at 03/14/19 1615  . heparin 1,500 Units/hr (03/22/19 0700)  . prismasol BGK 4/2.5 1,000 mL/hr at 03/21/19 2300   . allopurinol  150 mg Oral q morning - 10a  . Chlorhexidine Gluconate Cloth  6 each Topical Q0600  . gabapentin  300 mg Oral BID  . insulin aspart  0-9 Units Subcutaneous TID WC  . lidocaine (PF)  5 mL Other Once  . lidocaine  10 mL Intradermal Once  . midodrine  10 mg Oral TID WC  . polyethylene glycol  17 g Oral BID  . senna  1 tablet Oral Daily  . sodium chloride flush  3 mL Intravenous Q12H  . sodium chloride flush  3 mL Intravenous Q12H  . vitamin B-12  100 mcg Oral Daily   sodium chloride, acetaminophen **OR** acetaminophen, heparin, heparin, lidocaine, ondansetron **OR** ondansetron (ZOFRAN) IV, oxyCODONE, sodium chloride, sodium chloride, sodium chloride flush  Assessment/ Plan:   Acute kidney injury on chronic kidney disease stage IV with a baseline creatinine about 3 mg/dL.  On admission 5 mg/dL thought to be secondary to cardiorenal syndrome.  Still markedly edematous requiring CRRT for fluid removal.  My preference would be to continue CRRT will discuss with cardiology.  Progression of renal disease could also be secondary to multiple myeloma and this would carry poor prognosis agree with Dr. Haroldine Laws that palliative medicine should be involved  Hypertension/volume continue volume removal.  Would restart CRRT if cardiology are in agreement.  Warfarin skin necrosis biopsy negative for calciphylaxis would continue off warfarin as form of anticoagulation  Chronic atrial fibrillation IV heparin  Diabetes mellitus as per primary service  Multiple myeloma status post bone marrow biopsy consistent with this diagnosis  Palliative medicine to be consulted.   LOS: [place] @TODAY @7 :44 AM

## 2019-03-22 NOTE — Progress Notes (Signed)
Assisted tele visit to patient with family member.  [name] M Toni Schmuck, RN  

## 2019-03-22 NOTE — Progress Notes (Signed)
Patient ID: [name], female   DOB: 09/28/1960, 59 y.o.   MRN: [id number]     Advanced Heart Failure Rounding Note  PCP-Cardiologist: Kate Sable, MD   Subjective:    Respiratory status improved. Now on 4L HFNC. Sats in upper 90s  CVVH stopped 4 am when filter clotted.  I/O net negative 2711 yesterday, weight down 6 lbs (50 total). CVP 9 this morning.   Very weak with PT yesterday.  Legs still in pain.   New diagnosis of multiple myeloma. Bx for possible calciphylaxis was negative (? false negative).   Echo: EF < 20%, no LVH, mildly reduced RV systolic function, severe biatrial enlargement, severe TR.  RHC Procedural Findings (dobutamine 2.5 mcg/kg/min): Hemodynamics (mmHg) RA 18 RV 43/18 PA 45/19, mean 28 PCWP mean 19 Oxygen saturations: PA 63% AO 94% Cardiac Output (Fick) 6.1  Cardiac Index (Fick) 3.06 PVR 1.5 WU PAPI 1.4  CT chest w/o contrast: Atelectasis, no significant pulmonary disease.    Objective:   Weight Range: 68.3 kg Body mass index is 24.3 kg/m.   Vital Signs:   Temp:  [97.3 F (36.3 C)-98.3 F (36.8 C)] 97.6 F (36.4 C) (01/25 0640) Pulse Rate:  [58-118] 59 (01/25 0730) Resp:  [12-28] 24 (01/25 0730) BP: (93-122)/(53-97) 96/58 (01/25 0730) SpO2:  [90 %-100 %] 100 % (01/25 0730) Weight:  [68.3 kg] 68.3 kg (01/25 0500) Last BM Date: 03/20/19  Weight change: Filed Weights   03/20/19 0500 03/21/19 0500 03/22/19 0500  Weight: 75.3 kg 70.8 kg 68.3 kg    Intake/Output:   Intake/Output Summary (Last 24 hours) at 03/22/2019 0803 Last data filed at 03/22/2019 0730 Gross per 24 hour  Intake 2684.8 ml  Output 5079 ml  Net -2394.2 ml      Physical Exam   General: NAD Neck: JVP 8-9 cm, no thyromegaly or thyroid nodule.  Lungs: Clear to auscultation bilaterally with normal respiratory effort. CV: Lateral PMI.  Heart regular S1/S2, no S3/S4, 2/6 HSM LLSB.  Trace ankle edema.   Abdomen: Soft, nontender, no hepatosplenomegaly, no  distention.  Skin: Intact without lesions or rashes.  Neurologic: Alert and oriented x 3.  Psych: Normal affect. Extremities: No clubbing or cyanosis.  HEENT: Normal.    Telemetry   A fib BiV pacing 60 Personally reviewed    Labs    CBC Recent Labs    03/21/19 0452 03/22/19 0500  WBC 15.9* 16.1*  HGB 10.7* 11.5*  HCT 32.0* 34.8*  MCV 82.3 84.5  PLT 251 893   Basic Metabolic Panel Recent Labs    03/21/19 0452 03/21/19 0452 03/21/19 1603 03/22/19 0500  NA 130*   < > 130* 129*  K 4.9   < > 4.6 5.0  CL 94*   < > 95* 93*  CO2 21*   < > 21* 20*  GLUCOSE 174*   < > 228* 194*  BUN 25*   < > 25* 25*  CREATININE 1.70*   < > 1.74* 1.85*  CALCIUM 9.1   < > 8.9 9.1  MG 2.6*  --   --  2.9*  PHOS 2.7   < > 2.6 2.9   < > = values in this interval not displayed.   Liver Function Tests Recent Labs    03/21/19 1603 03/22/19 0500  ALBUMIN 2.9* 2.9*   No results for input(s): LIPASE, AMYLASE in the last 72 hours. Cardiac Enzymes No results for input(s): CKTOTAL, CKMB, CKMBINDEX, TROPONINI in the last 72 hours.  BNP:  BNP (last 3 results) Recent Labs    03/07/19 2101  BNP 1,072.0*    ProBNP (last 3 results) No results for input(s): PROBNP in the last 8760 hours.   D-Dimer No results for input(s): DDIMER in the last 72 hours. Hemoglobin A1C No results for input(s): HGBA1C in the last 72 hours. Fasting Lipid Panel No results for input(s): CHOL, HDL, LDLCALC, TRIG, CHOLHDL, LDLDIRECT in the last 72 hours. Thyroid Function Tests No results for input(s): TSH, T4TOTAL, T3FREE, THYROIDAB in the last 72 hours.  Invalid input(s): FREET3  Other results:   Imaging    No results found.   Medications:     Scheduled Medications: . allopurinol  150 mg Oral q morning - 10a  . Chlorhexidine Gluconate Cloth  6 each Topical Q0600  . gabapentin  300 mg Oral BID  . insulin aspart  0-9 Units Subcutaneous TID WC  . lidocaine (PF)  5 mL Other Once  . lidocaine  10 mL  Intradermal Once  . midodrine  10 mg Oral TID WC  . polyethylene glycol  17 g Oral BID  . senna  1 tablet Oral Daily  . sodium chloride flush  3 mL Intravenous Q12H  . sodium chloride flush  3 mL Intravenous Q12H  . vitamin B-12  100 mcg Oral Daily    Infusions: .  prismasol BGK 4/2.5 800 mL/hr at 03/21/19 2130  .  prismasol BGK 4/2.5 200 mL/hr at 03/21/19 1600  . sodium chloride 10 mL/hr at 03/14/19 1615  . heparin 1,500 Units/hr (03/22/19 0700)  . prismasol BGK 4/2.5 1,000 mL/hr at 03/21/19 2300    PRN Medications: sodium chloride, acetaminophen **OR** acetaminophen, heparin, heparin, lidocaine, ondansetron **OR** ondansetron (ZOFRAN) IV, oxyCODONE, sodium chloride, sodium chloride, sodium chloride flush    Assessment/Plan   1. Acute Hypoxic Respiratory Failure: Due to volume overload, improved. Now on 4L HFNC. CVP down to 9.  - 1 more day CVVH today then think transition back to iHD.  2. 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  Last echo in 2/19 with EF 15-20%. She has a [employer] CRT-D device. Admitted with progressive dyspnea and 30 lb weight gain. Empirically started on dobutamine 2.5 due to concern for low output. RHC showed R>L heart failure with low PAPI adequate cardiac output on dobutamine.  She has started iHD, tolerates well.  Now off dobutamine.  Still volume overloaded with significant oxygen requirement. Minimal response to Lasix/metolazone. Now on CVVHD, weight down about 50 lbs since admission. CVP 9. Edema improved. - I think 1 more day of CVVH today then back to [facility] tomorrow, will discuss with renal.  - Continue midodrine 10 mg tid with relative hypotension. SBP 90s-100s today - Stopped digoxin and benazepril with AKI.  - Stopped beta blocker with concern for low output.  - No BP room for hydralazine/nitrates.  - Cardiac amyloidosis is a concern with myeloma, but echo myocardial appearance is not consistent. Bone marrow biopsy does  not appear to have been stained for amyloid.  - LE u/s negative for DVT 2. AKI on CKD stage 4: Baseline creatinine around 3, went up to 5 with BUN 150 this admission. Suspect cardiorenal syndrome superimposed on possible diabetic nephropathy. She will be dialysis-dependent going forward. - CVP 9 today, would aim for 1 more day of CVVH.  - Nephrology assistance appreciated.  3. Chronic atrial fibrillation: Warfarin held for procedures, now on heparin gtt. Long-term, will transition to apixaban with some concern for warfarin  skin necrosis.    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  5. DM: Per primary.  6. Monoclonal gammopathy:  LV morphology does not look consistent with AL amyloidosis. She has been seen by hematology, has had bone marrow bx showing 14% plasma cells by flow cytometry on the aspirate which is by definition multiple myeloma. Seen by heme/onc, plan for outpatient treatment.  BM bx does not appear to have been stained for amyloid.  7. Tricuspid regurgitation: Severe by echo.  8  Subcutaneous nodules: ?calciphylaxis versus less likely warfarin skin necrosis.  - Off warfarin, will use Eliquis in future.  - Bx negative for calciphylaxis. (? False negative)  - Renal managing for possible calciphylaxis.   Given severe HF, hypotension, progressive renal failure and multiple myeloma with very poor functional status I worry she will not be able to tolerate chemo and/or iHD for long. Short-term prognosis seems poor. Will have palliative care see her.  CRITICAL CARE Performed by: Loralie Champagne  Total critical care time: 35 minutes  Critical care time was exclusive of separately billable procedures and treating other patients.  Critical care was necessary to treat or prevent imminent or life-threatening deterioration.  Critical care was time spent personally by me (independent of midlevel providers or residents) on the following activities: development of treatment plan with patient  and/or surrogate as well as nursing, discussions with consultants, evaluation of patient's response to treatment, examination of patient, obtaining history from patient or surrogate, ordering and performing treatments and interventions, ordering and review of laboratory studies, ordering and review of radiographic studies, pulse oximetry and re-evaluation of patient's condition.    Length of Stay: [place], MD  03/22/2019, 8:03 AM  Advanced Heart Failure Team Pager [phone] (M-F; 7a - 4p)  Please contact [facility] for night-coverage after hours (4p -7a ) and weekends on [email or web address]

## 2019-03-22 NOTE — Progress Notes (Signed)
Inpatient Rehabilitation-Admissions Coordinator   Per Heart failure notes, pt's short term prognosis seems poor. Nephrology and Heart Failure recommending palliative medicine consult. Will follow for goals of care meeting to help determine if CIR aligns with pt wishes.   Raechel Ache, OTR/L  Rehab Admissions Coordinator  [phone] 03/22/2019 10:19 AM

## 2019-03-22 NOTE — Progress Notes (Signed)
Chaplain provided support to [name] at his wife's bedside.    Chaplain will continue to follow-up.

## 2019-03-22 NOTE — Progress Notes (Signed)
Assisted tele visit to patient with family member.  Elonzo Sopp Samson, RN  

## 2019-03-23 DIAGNOSIS — Z95810 Presence of automatic (implantable) cardiac defibrillator: Secondary | ICD-10-CM

## 2019-03-23 DIAGNOSIS — Z515 Encounter for palliative care: Secondary | ICD-10-CM

## 2019-03-23 LAB — RENAL FUNCTION PANEL
Albumin: 2.8 g/dL — ABNORMAL LOW (ref 3.5–5.0)
Anion gap: 12 (ref 5–15)
BUN: 29 mg/dL — ABNORMAL HIGH (ref 6–20)
CO2: 21 mmol/L — ABNORMAL LOW (ref 22–32)
Calcium: 8.9 mg/dL (ref 8.9–10.3)
Chloride: 94 mmol/L — ABNORMAL LOW (ref 98–111)
Creatinine, Ser: 1.94 mg/dL — ABNORMAL HIGH (ref 0.44–1.00)
GFR calc Af Amer: 32 mL/min — ABNORMAL LOW (ref >60)
GFR calc non Af Amer: 28 mL/min — ABNORMAL LOW (ref >60)
Glucose, Bld: 218 mg/dL — ABNORMAL HIGH (ref 70–99)
Phosphorus: 2.9 mg/dL (ref 2.5–4.6)
Potassium: 5 mmol/L (ref 3.5–5.1)
Sodium: 127 mmol/L — ABNORMAL LOW (ref 135–145)

## 2019-03-23 LAB — GLUCOSE, CAPILLARY
Glucose-Capillary: 238 mg/dL — ABNORMAL HIGH (ref 70–99)
Glucose-Capillary: 253 mg/dL — ABNORMAL HIGH (ref 70–99)
Glucose-Capillary: 335 mg/dL — ABNORMAL HIGH (ref 70–99)
Glucose-Capillary: 320 mg/dL — ABNORMAL HIGH (ref 70–99)
Glucose-Capillary: 232 mg/dL — ABNORMAL HIGH (ref 70–99)

## 2019-03-23 LAB — CBC
HCT: 34.8 % — ABNORMAL LOW (ref 36.0–46.0)
Hemoglobin: 11.6 g/dL — ABNORMAL LOW (ref 12.0–15.0)
MCH: 27.8 pg (ref 26.0–34.0)
MCHC: 33.3 g/dL (ref 30.0–36.0)
MCV: 83.5 fL (ref 80.0–100.0)
Platelets: 227 10*3/uL (ref 150–400)
RBC: 4.17 MIL/uL (ref 3.87–5.11)
RDW: 20.2 % — ABNORMAL HIGH (ref 11.5–15.5)
WBC: 15.5 10*3/uL — ABNORMAL HIGH (ref 4.0–10.5)
nRBC: 2.2 % — ABNORMAL HIGH (ref 0.0–0.2)

## 2019-03-23 LAB — PROTIME-INR
INR: 1.6 — ABNORMAL HIGH (ref 0.8–1.2)
Prothrombin Time: 18.9 seconds — ABNORMAL HIGH (ref 11.4–15.2)

## 2019-03-23 LAB — MAGNESIUM: Magnesium: 2.8 mg/dL — ABNORMAL HIGH (ref 1.7–2.4)

## 2019-03-23 LAB — HEPARIN LEVEL (UNFRACTIONATED): Heparin Unfractionated: 0.64 IU/mL (ref 0.30–0.70)

## 2019-03-23 MED ORDER — INSULIN ASPART 100 UNIT/ML ~~LOC~~ SOLN
0.0000 [IU] | Freq: Every day | SUBCUTANEOUS | Status: DC
  Administered 2019-03-23: 2 [IU] via SUBCUTANEOUS
  Administered 2019-03-24: 3 [IU] via SUBCUTANEOUS
  Administered 2019-03-25: 22:00:00 2 [IU] via SUBCUTANEOUS
  Administered 2019-03-26: 5 [IU] via SUBCUTANEOUS
  Administered 2019-03-27: 2 [IU] via SUBCUTANEOUS

## 2019-03-23 MED ORDER — FENTANYL CITRATE (PF) 100 MCG/2ML IJ SOLN
12.5000 ug | Freq: Once | INTRAMUSCULAR | Status: AC
  Administered 2019-03-23: 12.5 ug via INTRAVENOUS
  Filled 2019-03-23: qty 2

## 2019-03-23 MED ORDER — INSULIN ASPART 100 UNIT/ML ~~LOC~~ SOLN
0.0000 [IU] | Freq: Three times a day (TID) | SUBCUTANEOUS | Status: DC
  Administered 2019-03-24: 3 [IU] via SUBCUTANEOUS
  Administered 2019-03-24 (×2): 8 [IU] via SUBCUTANEOUS
  Administered 2019-03-25: 11 [IU] via SUBCUTANEOUS
  Administered 2019-03-25: 5 [IU] via SUBCUTANEOUS
  Administered 2019-03-26: 18:00:00 8 [IU] via SUBCUTANEOUS
  Administered 2019-03-26: 2 [IU] via SUBCUTANEOUS
  Administered 2019-03-27: 5 [IU] via SUBCUTANEOUS
  Administered 2019-03-27: 15 [IU] via SUBCUTANEOUS
  Administered 2019-03-27: 14:00:00 8 [IU] via SUBCUTANEOUS
  Administered 2019-03-28: 18:00:00 3 [IU] via SUBCUTANEOUS
  Administered 2019-03-28: 09:00:00 5 [IU] via SUBCUTANEOUS
  Administered 2019-03-28: 13:00:00 3 [IU] via SUBCUTANEOUS
  Administered 2019-03-29 (×2): 2 [IU] via SUBCUTANEOUS

## 2019-03-23 MED ORDER — FENTANYL CITRATE (PF) 100 MCG/2ML IJ SOLN
12.5000 ug | INTRAMUSCULAR | Status: DC | PRN
  Administered 2019-03-23: 12.5 ug via INTRAVENOUS
  Filled 2019-03-23: qty 2

## 2019-03-23 NOTE — Progress Notes (Signed)
PULMONARY / CRITICAL CARE MEDICINE   NAME:  [name], MRN:  [id number], DOB:  25-May-1960, LOS: 19 ADMISSION DATE:  03/07/2019, CONSULTATION DATE:   REFERRING MD:  Dr. 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   SIGNIFICANT PAST MEDICAL HISTORY   Automatic implantable cardioverter-defibrillator in situ, Chronic anticoagulation, Chronic atrial fibrillation (HCC), Chronic kidney disease, Chronic systolic CHF (congestive heart failure) ([place]), Congenital third degree heart block, Dizziness and giddiness (05/19/2012), Dysphagia (08/14/2011), GERD (gastroesophageal reflux disease), Helicobacter pylori gastritis (08/14/2011), IDDM (insulin dependent diabetes mellitus), Kidney stones (1990's), Nonischemic cardiomyopathy ([place]), Potassium (K) excess, and Stroke ([place]) (1999).  SIGNIFICANT EVENTS:  1/10 admitted to [facility] for acute on chronic heart failure exacerbation  1/20 punch biopsy done  1/21 transferred to icu for acute hypoxic respiratory failure, placed on bipap, on crrt 1/22 CT chest without pe, was on 13L HFNC, got bipap intermittently, on crrt  STUDIES:   TTE 1/12  1. Left ventricular ejection fraction, by visual estimation, is <20%. The left ventricle has severely decreased function. There is no left ventricular hypertrophy.  2. Definity contrast agent was given IV to delineate the left ventricular endocardial borders.  3. Abnormal septal motion consistent with left bundle branch block.  4. Left ventricular diastolic parameters are consistent with Grade III diastolic dysfunction (restrictive).  5. Moderately dilated left ventricular internal cavity size.  6. The left ventricle  demonstrates global hypokinesis.  7. Global right ventricle has mildly reduced systolic function.The right ventricular size is severely enlarged. No increase in right ventricular wall thickness.  8. Left atrial size was severely dilated.  9. Right atrial size was severely dilated. 10. Trivial pericardial effusion is present. 11. The mitral valve is normal in structure. No evidence of mitral valve regurgitation. 12. The tricuspid valve is normal in structure. 13. The aortic valve is normal in structure. Aortic valve regurgitation is not visualized. Mild aortic valve sclerosis without stenosis. 14. Pulmonic regurgitation is moderate. 15. The pulmonic valve was normal in structure. Pulmonic valve regurgitation is moderate. 16. Mildly dilated pulmonary artery. 17. Mildly elevated pulmonary artery systolic pressure. 18. The tricuspid regurgitant velocity is 2.32 m/s, and with an assumed right atrial pressure of 15 mmHg, the estimated right ventricular systolic pressure is mildly elevated at 36.6 mmHg. 19. A pacer wire is visualized. 20. The inferior vena cava is dilated in size with <50% respiratory variability, suggesting right atrial pressure of 15 mmHg. 21. No intracardiac thrombi or masses were visualized. 22. No significant change from prior study (April 10, 2017). 23. Prior images reviewed side by side  Right heart cath 03/09/2019 1. Right > left heart failure with low PAPI.  2. Pulmonary venous hypertension.  3. Adequate cardiac output on dobutamine 2.5 mcg/kg/min  CT chest 1/22 IMPRESSION: 1. Moderate cardiomegaly.  One vessel coronary atherosclerosis. 2. Dilated main pulmonary artery, suggesting pulmonary arterial hypertension. 3. Mild platelike scarring versus atelectasis at the left lung base. Otherwise no active pulmonary disease. No pleural effusions. 4. Nonspecific mild left prevascular mediastinal lymphadenopathy. 5. Ectatic 4.4 cm ascending thoracic aorta. Recommend  annual imaging followup by CTA or MRA. This recommendation follows 2010 ACCF/AHA/AATS/ACR/ASA/SCA/SCAI/SIR/STS/SVM Guidelines for the Diagnosis and Management of Patients with Thoracic  Aortic Disease. Circulation. 2010; 121: Z610-R604. Aortic aneurysm NOS (ICD10-I71.9). 6. Finely irregular liver surface, cannot exclude cirrhosis.  Aortic Atherosclerosis (ICD10-I70.0).  CULTURES:   MRSA PCR 1/11 negative  Respiratory viral panel 1/10 neg covid, neg flu a and b   ANTIBIOTICS:    LINES/TUBES:  Peripheral IV right hand 1/15 Peripheral Iv left forearm 1/17  CONSULTANTS:  Nephrology Heart Failure  Surgery  SUBJECTIVE:  Patient states that she has severe pain in her bilateral lower extremities and abdomen. Husband who is a Theme park manager at [employer] is at bedside.   CONSTITUTIONAL: BP (!) 88/66   Pulse 71   Temp (!) 97.4 F (36.3 C) (Oral)   Resp 20   Ht 5' 6"  (1.676 m)   Wt 67.9 kg   SpO2 95%   BMI 24.16 kg/m   I/O last 3 completed shifts: In: 2741.4 [P.O.:2232; I.V.:509.4] Out: 6521 [Other:6521]  CVP:  [4 mmHg-8 mmHg] 4 mmHg  PHYSICAL EXAM: General:  Appears comfortable, no acute distress Neuro: slow to respond coherent  HEENT: on room air Cardiovascular: rrr s1 and s2 audible  Lungs: ctab no rales or rhonchi Abdomen: firm abdomen with masses Musculoskeletal: no pitting edema Skin: warm extremities   ASSESSMENT AND PLAN    Cardiorenal syndrome Acute on chronic renal failure ESRD Thought to be secondary to diabetes mellitus, cardiorenal syndrome, MGUS. Patient is accepted for OP HD at [facility] TTS 11:45am.   Patient was able to have 5L removed over the past 24 hrs. Per nephrology ok to trial off of crrt to iHD. Creatinine improved from 2.23>1.94.  -Palliative care following to discuss goals of care -Appreciate nephrology following  Acute hypoxemic respiratory failure Acute respiratory alkalosis Thought to be secondary to pulmonary  edema. Patient has been breathing on room air.   -Will continue to monitor respiratory needs  -continue CRRT per nephrology  Acute on chronic systolic heart failure, VW<09% Longstanding NICM [employer] CRT-D Getting CVVHD, tolerating well. Hemodynamically stable not on any pressors.   -follow cvp -continue midodrine 8m tid, consider dose when on dialysis  -Continue cvvhd -cannot use digoxin, acei/arb due to aki  -cannot use b blocker due to concern for low output  -hypotensive so cannot do nitrates/hydralazine  Hypervolemic Hyponatremia Na 127   -continue with dialysis   Leukocytosis  Leukocytosis improving 16>15, afebrile. Likely secondary to stress.  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  Protein electrophoresis showing M spike 1.2%, albumin 2.6. Bone marrow biopsy showing 14% plasma cells. Oncology plans to do chemotherapy outpatient.   -appreciate oncology following   Chronic atrial fibrillation   -continue heparin   Diabetes Mellitus  Patient's blood sugars have been ranging 190-200s. 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 Punch biopsy done by general surgery 1/20 resulted no morphological evidence of calciphylaxis. 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   -stop warfarin -gabapentin 3053mbid   Abdominal tenderness Constipation  CT abdomen done 1/11 does not show any acute abnormality. Has been having regular bowel movements on laxatives.   Patient has large stool burden, has not had bm in 4 days.   -miralax bid  -senna qd -please document bm  Left Renal Cyst  1.5cm in size simple exophytic seen on CT chest 1/22  Ectatic ascending thoracic aorta  4.4cm in size.   -annual follow up  Best  Practice / Goals of Care / Disposition.   DVT PROPHYLAXIS: heparin SUP: n/a NUTRITION: renal diet MOBILITY: to work with PT GOALS OF CARE: Full code FAMILY DISCUSSIONS: Updated patient and husband at bedside DISPOSITION ICU  LABS  Glucose Recent Labs  Lab 03/22/19 0636 03/22/19 0755 03/22/19 1122 03/22/19 1514 03/22/19 2128 03/23/19 0633  GLUCAP 198* 252* 295* 268* 163* 253*    BMET Recent Labs  Lab 03/22/19 0500 03/22/19 1700 03/23/19 0405  NA 129* 126* 127*  K 5.0 4.8 5.0  CL 93* 92* 94*  CO2 20* 22 21*  BUN 25* 35* 29*  CREATININE 1.85* 2.23* 1.94*  GLUCOSE 194* 257* 218*    Liver Enzymes Recent Labs  Lab 03/22/19 0500 03/22/19 1700 03/23/19 0405  ALBUMIN 2.9* 2.7* 2.8*    Electrolytes Recent Labs  Lab 03/21/19 0452 03/21/19 1603 03/22/19 0500 03/22/19 1700 03/23/19 0405  CALCIUM 9.1   < > 9.1 8.7* 8.9  MG 2.6*  --  2.9*  --  2.8*  PHOS 2.7   < > 2.9 3.1 2.9   < > = values in this interval not displayed.    CBC Recent Labs  Lab 03/21/19 0452 03/22/19 0500 03/23/19 0350  WBC 15.9* 16.1* 15.5*  HGB 10.7* 11.5* 11.6*  HCT 32.0* 34.8* 34.8*  PLT 251 248 227    ABG Recent Labs  Lab 03/18/19 1030 03/18/19 1327  PHART 7.463* 7.431  PCO2ART 29.9* 36.2  PO2ART 52.0* 131.0*    Coag's Recent Labs  Lab 03/21/19 0452 03/22/19 0500 03/23/19 0350  INR 1.8* 1.6* 1.6*    Sepsis Markers No results for input(s): LATICACIDVEN, PROCALCITON, O2SATVEN in the last 168 hours.  Cardiac Enzymes No results for input(s): TROPONINI, PROBNP in the last 168 hours.  REVIEW OF SYSTEMS:    Patient states that breathing has improved from yesterday. Has breast tenderness and lower extremity pain.

## 2019-03-23 NOTE — Progress Notes (Signed)
ANTICOAGULATION CONSULT NOTE - Cyr[place] for heparin Indication: atrial fibrillation  Allergies  Allergen Reactions  . Wheat Swelling  . Latex Rash  . Penicillins Rash  . Sulfa Antibiotics Rash    Patient Measurements: Height: 5\' 6"  (167.6 cm) Weight: 149 lb 11.1 oz (67.9 kg) IBW/kg (Calculated) : 59.3  Vital Signs: Temp: 97.4 F (36.3 C) (01/26 1244) Temp Source: Axillary (01/26 1244) BP: 80/49 (01/26 1300) Pulse Rate: 61 (01/26 1500)  Labs: Recent Labs    03/21/19 0452 03/21/19 1603 03/22/19 0500 03/22/19 1700 03/23/19 0350 03/23/19 0405 03/23/19 0406  HGB 10.7*  --  11.5*  --  11.6*  --   --   HCT 32.0*  --  34.8*  --  34.8*  --   --   PLT 251  --  248  --  227  --   --   LABPROT 20.7*  --  19.2*  --  18.9*  --   --   INR 1.8*  --  1.6*  --  1.6*  --   --   HEPARINUNFRC 0.62  --  0.75* 0.43  --   --  0.64  CREATININE 1.70*   < > 1.85* 2.23*  --  1.94*  --    < > = values in this interval not displayed.    Estimated Creatinine Clearance: 29.2 mL/min (A) (by C-G formula based on SCr of 1.94 mg/dL (H)).   Assessment: 59 yo female with hx AFib, Coumadin on hold, continue heparin bridge.   Heparin level above goal today.  No overt bleeding or complications noted.  CBC fairly stable.  Goal of Therapy:  Heparin level goal 0.3-0.7 Monitor platelets by anticoagulation protocol: Yes   Plan:  Continue IV heparin 1350 units/hr. Daily heparin level and CBC. Perhaps now that off of CRRT, could we start Eliquis?  Marguerite Olea, [facility] Clinical Pharmacist Phone [phone]  03/23/2019 3:05 PM

## 2019-03-23 NOTE — Progress Notes (Signed)
Inpatient Diabetes Program Recommendations  AACE/ADA: New Consensus Statement on Inpatient Glycemic Control (2015)  Target Ranges:  Prepandial:   less than 140 mg/dL      Peak postprandial:   less than 180 mg/dL (1-2 hours)      Critically ill patients:  140 - 180 mg/dL   Lab Results  Component Value Date   GLUCAP 253 (H) 03/23/2019   HGBA1C 7.2 (H) 01/18/2019    Review of Glycemic Control Results for [name], [name] (MRN [id number]) as of 03/23/2019 11:06  Ref. Range 03/22/2019 11:22 03/22/2019 15:14 03/22/2019 21:28 03/23/2019 06:33  Glucose-Capillary Latest Ref Range: 70 - 99 mg/dL 295 (H) 268 (H) 163 (H) 253 (H)   Diabetes history: DM 2 Outpatient Diabetes medications: NPH 22 units , Novolog 12 units with breakfast/14 units lunch/16 units dinner Current orders for Inpatient glycemic control:  Novolog sensitive tid with meals  Inpatient Diabetes Program Recommendations:    If appropriate, consider adding Levemir 6 units bid.   Thanks,  Adah Perl, RN, BC-ADM Inpatient Diabetes Coordinator Pager [phone] (8a-5p)

## 2019-03-23 NOTE — Progress Notes (Signed)
E-link RN contacted in reference to lack of an insulin sliding scale order for coverage of CBG monitoring at bedtime. Requested insulin order be updated in order to administer med per BG result. jac

## 2019-03-23 NOTE — Progress Notes (Addendum)
Patient ID: [name], female   DOB: 04-19-1960, 59 y.o.   MRN: [id number]  This NP visited patient at the bedside as a follow up to  yesterday's [place] and to meet with family; husband, son/Isiah, daughter/ Anitra, and son/Chris by telephone for continued conversation regarding current medical situation.   Diagnosis, prognosis, treatment options and AD were dicussed.  All understand the seriousness of the current medical situation. Questions and concerns addressed.  Created space and opportunity for patient and her family to explore their thoughts and feelings regarding [name] situation.   All hope and pray for Improvement and longer life.   Faith runs deep in the family.  All express their love and appreciation for each other.  Patient verbalized to her family her hopes for them "when she is gone"  Emotional support offered.  Discussed with patient the importance of continued conversation with her family and the  medical providers regarding overall plan of care and treatment options,  ensuring decisions are within the context of the patients values and GOCs.  Plan of care: - limited code/ no CPR, compression, intubation.  Patient requests to leave AICD activated -continue to treat the treatable and hope for improvement   Questions and concerns addressed   Discussed with Dr Justin Mend and Dr Lynetta Mare and bedside RN  PMT will continue to support holistically  Total time spent on the unit was 60 minutes  Greater than 50% of the time was spent in counseling and coordination of care  Wadie Lessen NP  Palliative Medicine Team Team Phone # [phone] Pager [phone]

## 2019-03-23 NOTE — Progress Notes (Signed)
eLink Physician-Brief Progress Note Patient Name: GREENLEE ANCHETA DOB: 1960/12/19 MRN: [id number]   Date of Service  03/23/2019  HPI/Events of Note  Lower extremity pain not relieved by Oxycodone this morning  eICU Interventions  Fentanyl 12.5 mcg iv x 1        Jatavious Peppard U Fredrika Canby 03/23/2019, 6:34 AM

## 2019-03-23 NOTE — Progress Notes (Signed)
Patient ID: [name], female   DOB: May 27, 1960, 59 y.o.   MRN: [id number]     Advanced Heart Failure Rounding Note  PCP-Cardiologist: Kate Sable, MD   Subjective:    Respiratory status improved. Now off oxygen. Sats in upper 90s  CVVH ongoing, net -200 cc/hr. Weight continues to fall. CVP 4 this morning.   Still significant leg and lower abdominal pain.  "Dizzy" with oxycodone.    New diagnosis of multiple myeloma. Bx for possible calciphylaxis was negative (? false negative).   Echo: EF < 20%, no LVH, mildly reduced RV systolic function, severe biatrial enlargement, severe TR.  RHC Procedural Findings (dobutamine 2.5 mcg/kg/min): Hemodynamics (mmHg) RA 18 RV 43/18 PA 45/19, mean 28 PCWP mean 19 Oxygen saturations: PA 63% AO 94% Cardiac Output (Fick) 6.1  Cardiac Index (Fick) 3.06 PVR 1.5 WU PAPI 1.4  CT chest w/o contrast: Atelectasis, no significant pulmonary disease.    Objective:   Weight Range: 67.9 kg Body mass index is 24.16 kg/m.   Vital Signs:   Temp:  [96.9 F (36.1 C)-97.7 F (36.5 C)] 97.7 F (36.5 C) (01/26 0400) Pulse Rate:  [56-118] 71 (01/26 0730) Resp:  [12-35] 20 (01/26 0730) BP: (84-125)/(52-102) 88/66 (01/26 0730) SpO2:  [90 %-100 %] 95 % (01/26 0730) Weight:  [67.9 kg] 67.9 kg (01/26 0356) Last BM Date: 03/20/19  Weight change: Filed Weights   03/21/19 0500 03/22/19 0500 03/23/19 0356  Weight: 70.8 kg 68.3 kg 67.9 kg    Intake/Output:   Intake/Output Summary (Last 24 hours) at 03/23/2019 0812 Last data filed at 03/23/2019 0700 Gross per 24 hour  Intake 1827.04 ml  Output 5042 ml  Net -3214.96 ml      Physical Exam   General: NAD Neck: No JVD, no thyromegaly or thyroid nodule.  Lungs: Clear to auscultation bilaterally with normal respiratory effort. CV: Lateral PMI.  Heart regular S1/S2, no S3/S4, no murmur.  No peripheral edema.   Abdomen: Soft, nontender, no hepatosplenomegaly, no distention.  Skin: Intact  without lesions or rashes.  Neurologic: Alert and oriented x 3.  Psych: Normal affect. Extremities: No clubbing or cyanosis.  HEENT: Normal.    Telemetry   A fib BiV pacing 60 Personally reviewed    Labs    CBC Recent Labs    03/22/19 0500 03/23/19 0350  WBC 16.1* 15.5*  HGB 11.5* 11.6*  HCT 34.8* 34.8*  MCV 84.5 83.5  PLT 248 791   Basic Metabolic Panel Recent Labs    03/22/19 0500 03/22/19 0500 03/22/19 1700 03/23/19 0405  NA 129*   < > 126* 127*  K 5.0   < > 4.8 5.0  CL 93*   < > 92* 94*  CO2 20*   < > 22 21*  GLUCOSE 194*   < > 257* 218*  BUN 25*   < > 35* 29*  CREATININE 1.85*   < > 2.23* 1.94*  CALCIUM 9.1   < > 8.7* 8.9  MG 2.9*  --   --  2.8*  PHOS 2.9   < > 3.1 2.9   < > = values in this interval not displayed.   Liver Function Tests Recent Labs    03/22/19 1700 03/23/19 0405  ALBUMIN 2.7* 2.8*   No results for input(s): LIPASE, AMYLASE in the last 72 hours. Cardiac Enzymes No results for input(s): CKTOTAL, CKMB, CKMBINDEX, TROPONINI in the last 72 hours.  BNP: BNP (last 3 results) Recent Labs    03/07/19 2101  BNP 1,072.0*    ProBNP (last 3 results) No results for input(s): PROBNP in the last 8760 hours.   D-Dimer No results for input(s): DDIMER in the last 72 hours. Hemoglobin A1C No results for input(s): HGBA1C in the last 72 hours. Fasting Lipid Panel No results for input(s): CHOL, HDL, LDLCALC, TRIG, CHOLHDL, LDLDIRECT in the last 72 hours. Thyroid Function Tests No results for input(s): TSH, T4TOTAL, T3FREE, THYROIDAB in the last 72 hours.  Invalid input(s): FREET3  Other results:   Imaging    No results found.   Medications:     Scheduled Medications: . allopurinol  150 mg Oral q morning - 10a  . Chlorhexidine Gluconate Cloth  6 each Topical Q0600  . gabapentin  300 mg Oral BID  . insulin aspart  0-9 Units Subcutaneous TID WC  . lidocaine (PF)  5 mL Other Once  . lidocaine  10 mL Intradermal Once  .  midodrine  10 mg Oral TID WC  . polyethylene glycol  17 g Oral BID  . senna  1 tablet Oral Daily  . sodium chloride flush  10-40 mL Intracatheter Q12H  . sodium chloride flush  3 mL Intravenous Q12H  . vitamin B-12  100 mcg Oral Daily    Infusions: .  prismasol BGK 4/2.5 800 mL/hr at 03/22/19 1757  .  prismasol BGK 4/2.5 200 mL/hr at 03/22/19 1125  . sodium chloride 10 mL/hr at 03/14/19 1615  . heparin 1,350 Units/hr (03/23/19 0812)  . prismasol BGK 4/2.5 1,000 mL/hr at 03/23/19 0805    PRN Medications: sodium chloride, acetaminophen **OR** acetaminophen, fentaNYL (SUBLIMAZE) injection, heparin, heparin, lidocaine, ondansetron **OR** ondansetron (ZOFRAN) IV, sodium chloride, sodium chloride, sodium chloride flush, sodium chloride flush    Assessment/Plan   1. Acute Hypoxic Respiratory Failure: Resolved, off oxygen. 2. 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  Last echo in 2/19 with EF 15-20%. She has a [employer] CRT-D device. Admitted with progressive dyspnea and 30 lb weight gain. Empirically started on dobutamine 2.5 due to concern for low output. RHC showed R>L heart failure with low PAPI adequate cardiac output on dobutamine.  She has started iHD, tolerates well.  Now off dobutamine.  Still volume overloaded with significant oxygen requirement. Minimal response to Lasix/metolazone. Now on CVVHD, weight down about 50 lbs since admission. CVP 4. Edema improved. - Stop CVVH today, back to iHD. She appears to be HD dependent.   - Continue midodrine 10 mg tid with relative hypotension. SBP 90s-100s today - Stopped digoxin and benazepril with AKI.  - Stopped beta blocker with concern for low output.  - No BP room for hydralazine/nitrates.  - Cardiac amyloidosis is a concern with myeloma, but echo myocardial appearance is not consistent. Bone marrow biopsy does not appear to have been stained for amyloid => we have asked pathology to stain the bone marrow  biopsy for amyloidosis.   - LE u/s negative for DVT 2. AKI on CKD stage 4: Baseline creatinine around 3, went up to 5 with BUN 150 this admission. Suspect cardiorenal syndrome superimposed on possible diabetic nephropathy. She will be dialysis-dependent going forward. - Stop CVVH today, back to iHD.   - Nephrology assistance appreciated.  3. Chronic atrial fibrillation: Warfarin held for procedures, now on heparin gtt. Long-term, will transition to apixaban with some concern for warfarin skin necrosis.    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  5. DM: Per primary.  6. Monoclonal gammopathy:  LV morphology does not  look consistent with AL amyloidosis. She has been seen by hematology, has had bone marrow bx showing 14% plasma cells by flow cytometry on the aspirate which is by definition multiple myeloma. Seen by heme/onc, plan for outpatient treatment.  BM bx does not appear to have been stained for amyloid.  7. Tricuspid regurgitation: Severe by echo.  8.  Subcutaneous nodules: ?calciphylaxis versus less likely warfarin skin necrosis.  - Off warfarin, will use Eliquis in future.  - Bx negative for calciphylaxis. (? False negative)  - Renal managing for possible calciphylaxis.  9. Deconditioning: Needs aggressive PT.  10. She has been seen by palliative care given poor prognosis, full code for now.   CRITICAL CARE Performed by: Loralie Champagne  Total critical care time: 35 minutes  Critical care time was exclusive of separately billable procedures and treating other patients.  Critical care was necessary to treat or prevent imminent or life-threatening deterioration.  Critical care was time spent personally by me (independent of midlevel providers or residents) on the following activities: development of treatment plan with patient and/or surrogate as well as nursing, discussions with consultants, evaluation of patient's response to treatment, examination of patient, obtaining history from  patient or surrogate, ordering and performing treatments and interventions, ordering and review of laboratory studies, ordering and review of radiographic studies, pulse oximetry and re-evaluation of patient's condition.    Length of Stay: [place], MD  03/23/2019, 8:12 AM  Advanced Heart Failure Team Pager [phone] (M-F; 7a - 4p)  Please contact [facility] for night-coverage after hours (4p -7a ) and weekends on [email or web address]

## 2019-03-23 NOTE — Progress Notes (Signed)
Physical Therapy Treatment Patient Details Name: [name] MRN: [id number] DOB: Jan 06, 1961 Today's Date: 03/23/2019    History of Present Illness 59 year old with PMH significant for MGUS, thrombocytopenia, A. fib on Coumadin, CHB with pacemaker, nonischemic cardiomyopathy, CKD stage IV baseline creatinine 3.0, cirrhosis who presented with several weeks of progressive swelling about 30 pound weight gain. critically ill due to acute respiratory failure requiring BiPAP due to acute pulmonary edema due to acute decompensated HFrEF in context of stage V ESRD; new diagnosis of Multiple Myeloma.    PT Comments    Patient progressing well towards PT goals. Requires Mod A for bed mobility and standing transfers. Pt lethargic this session due to recent medications. Tolerated gait training today with mod A for support and use of RW. Pt with tendency for posterior lean and LOB without hands on assist. BP soft but pt asymptomatic. VSS on RA. Sitting BP pre activity 85/42 (51), sitting BP post activity 98/54 (67). Spouse present and supportive. Will follow.   Follow Up Recommendations  CIR;Supervision for mobility/OOB;Supervision/Assistance - 24 hour     Equipment Recommendations  Rolling walker with 5" wheels;3in1 (PT);Wheelchair (measurements PT);Wheelchair cushion (measurements PT)    Recommendations for Other Services       Precautions / Restrictions Precautions Precautions: Fall Restrictions Weight Bearing Restrictions: No    Mobility  Bed Mobility Overal bed mobility: Needs Assistance Bed Mobility: Supine to Sit     Supine to sit: Mod assist;HOB elevated     General bed mobility comments: Assist with LEs to get to EOB and cues to reach for rail.  Transfers Overall transfer level: Needs assistance Equipment used: Rolling walker (2 wheeled) Transfers: Sit to/from Stand Sit to Stand: Mod assist;Min assist;+2 physical assistance         General transfer comment: Light Mod A  to power to standing from EOB x1, from chair x1, transferred to chair post ambulation.  Ambulation/Gait Ambulation/Gait assistance: Min assist;Mod assist;+2 safety/equipment Gait Distance (Feet): 12 Feet(+ 20') Assistive device: Rolling walker (2 wheeled) Gait Pattern/deviations: Step-to pattern;Step-through pattern;Leaning posteriorly;Narrow base of support;Decreased step length - right;Decreased step length - left Gait velocity: decreased   General Gait Details: Slow, unsteady gait with narrow BoS and posterior lean, requiring Mod A for balance and chair follow. Lethargic as well. VSS.   Stairs             Wheelchair Mobility    Modified Rankin (Stroke Patients Only)       Balance Overall balance assessment: Needs assistance Sitting-balance support: Feet supported;No upper extremity supported Sitting balance-Leahy Scale: Fair Sitting balance - Comments: Initially Min A for balance due to decreased arousal progressing to Min guard   Standing balance support: During functional activity Standing balance-Leahy Scale: Poor Standing balance comment: dependent on external support and use of RW, posterior lean. Cues to widen BoS.                            Cognition Arousal/Alertness: Lethargic Behavior During Therapy: WFL for tasks assessed/performed Overall Cognitive Status: Impaired/Different from baseline Area of Impairment: Attention;Following commands                   Current Attention Level: Focused;Sustained           General Comments: Lethargic during session, just given medicine that makes her sleepy per report.      Exercises      General Comments General comments (skin integrity, edema,  etc.): VSS on RA. Soft BP, sitting EOB 85/42 (51), sitting post activity 98/54 (67).      Pertinent Vitals/Pain Pain Assessment: Faces Faces Pain Scale: Hurts little more Pain Location: BLEs Pain Descriptors / Indicators: Grimacing;Sore Pain  Intervention(s): Repositioned;Monitored during session;Premedicated before session    Home Living                      Prior Function            PT Goals (current goals can now be found in the care plan section) Progress towards PT goals: Progressing toward goals    Frequency    Min 3X/week      PT Plan Current plan remains appropriate    Co-evaluation PT/OT/SLP Co-Evaluation/Treatment: Yes Reason for Co-Treatment: Necessary to address cognition/behavior during functional activity;For patient/therapist safety;To address functional/ADL transfers PT goals addressed during session: Mobility/safety with mobility;Balance        AM-PAC PT "6 Clicks" Mobility   Outcome Measure  Help needed turning from your back to your side while in a flat bed without using bedrails?: A Little Help needed moving from lying on your back to sitting on the side of a flat bed without using bedrails?: A Lot Help needed moving to and from a bed to a chair (including a wheelchair)?: A Lot Help needed standing up from a chair using your arms (e.g., wheelchair or bedside chair)?: A Lot Help needed to walk in hospital room?: A Lot Help needed climbing 3-5 steps with a railing? : A Lot 6 Click Score: 13    End of Session Equipment Utilized During Treatment: Gait belt Activity Tolerance: Patient tolerated treatment well Patient left: in chair;with call bell/phone within reach;with family/visitor present Nurse Communication: Mobility status PT Visit Diagnosis: Unsteadiness on feet (R26.81);Other abnormalities of gait and mobility (R26.89);Muscle weakness (generalized) (M62.81)     Time: 8832-5498 PT Time Calculation (min) (ACUTE ONLY): 23 min  Charges:  $Gait Training: 8-22 mins                     Marisa Severin, PT, DPT Acute Rehabilitation Services Pager [phone] Office [phone]       [place] 03/23/2019, 12:48 PM

## 2019-03-23 NOTE — Progress Notes (Signed)
Occupational Therapy Treatment Patient Details Name: [name] MRN: [id number] DOB: March 19, 1960 Today's Date: 03/23/2019    History of present illness 59 year old with PMH significant for MGUS, thrombocytopenia, A. fib on Coumadin, CHB with pacemaker, nonischemic cardiomyopathy, CKD stage IV baseline creatinine 3.0, cirrhosis who presented with several weeks of progressive swelling about 30 pound weight gain. critically ill due to acute respiratory failure requiring BiPAP due to acute pulmonary edema due to acute decompensated HFrEF in context of stage V ESRD; new diagnosis of Multiple Myeloma.   OT comments  Pt continues to present with weakness, fatigue, and decreased balance. Despite fatigue and lethargy, pt very motivated to participate in therapy. Pt performing grooming at bed level with set up. Pt requiring Min-Mod A +2 for functional mobility with RW. Pt's husband present throughout and very supportive. Continue to recommend dc to CIR and will continue to follow acutely as admitted.    Follow Up Recommendations  CIR;Supervision/Assistance - 24 hour    Equipment Recommendations  3 in 1 bedside commode    Recommendations for Other Services PT consult;Rehab consult;Other (comment)(Palliative Medicine)    Precautions / Restrictions Precautions Precautions: Fall Precaution Comments: watch O2 Restrictions Weight Bearing Restrictions: No       Mobility Bed Mobility Overal bed mobility: Needs Assistance Bed Mobility: Supine to Sit     Supine to sit: Mod assist;HOB elevated     General bed mobility comments: Assist with LEs to get to EOB and cues to reach for rail.  Transfers Overall transfer level: Needs assistance Equipment used: Rolling walker (2 wheeled) Transfers: Sit to/from Stand Sit to Stand: Mod assist;Min assist;+2 physical assistance         General transfer comment: Min A +2 to power up into standing from EOB. Light Mod A to power to standing from EOB x1,  from chair x1, transferred to chair post ambulation.    Balance Overall balance assessment: Needs assistance Sitting-balance support: Feet supported;No upper extremity supported Sitting balance-Leahy Scale: Fair Sitting balance - Comments: Initially Min A for balance due to decreased arousal progressing to Min guard   Standing balance support: During functional activity Standing balance-Leahy Scale: Poor Standing balance comment: dependent on external support and use of RW, posterior lean. Cues to widen BoS.                           ADL either performed or assessed with clinical judgement   ADL Overall ADL's : Needs assistance/impaired     Grooming: Set up;Supervision/safety;Wash/dry face;Bed level Grooming Details (indicate cue type and reason): Pt washing her face while supine in bed             Lower Body Dressing: Maximal assistance;Sit to/from stand Lower Body Dressing Details (indicate cue type and reason): To don socks Toilet Transfer: Moderate assistance;Minimal assistance;+2 for physical assistance;Ambulation;RW(simulated to recliner) Toilet Transfer Details (indicate cue type and reason): Min-Mod A +2 to power up into standing          Functional mobility during ADLs: Moderate assistance;+2 for physical assistance;Minimal assistance;+2 for safety/equipment;Rolling walker General ADL Comments: Pt presenting with decreased activity tolerance. Very fatigued from medication     Vision       Perception     Praxis      Cognition Arousal/Alertness: Lethargic Behavior During Therapy: WFL for tasks assessed/performed Overall Cognitive Status: Impaired/Different from baseline Area of Impairment: Attention;Following commands  Current Attention Level: Focused;Sustained   Following Commands: Follows one step commands with increased time Safety/Judgement: Decreased awareness of safety Awareness: Emergent   General Comments:  Lethargic during session, just given medicine that makes her sleepy per report.        Exercises     Shoulder Instructions       General Comments VSS on RA. Soft BP, sitting EOB 85/42 (51), sitting post activity 98/54 (67).    Pertinent Vitals/ Pain       Pain Assessment: Faces Faces Pain Scale: Hurts little more Pain Location: BLEs Pain Descriptors / Indicators: Grimacing;Sore Pain Intervention(s): Limited activity within patient's tolerance;Monitored during session;Repositioned  Home Living                                          Prior Functioning/Environment              Frequency  Min 2X/week        Progress Toward Goals  OT Goals(current goals can now be found in the care plan section)  Progress towards OT goals: Progressing toward goals  Acute Rehab OT Goals Patient Stated Goal: get better, play with grandkids OT Goal Formulation: With patient/family Time For Goal Achievement: 03/31/19 Potential to Achieve Goals: Good ADL Goals Pt Will Perform Grooming: with modified independence;sitting Pt Will Perform Upper Body Bathing: with modified independence;sitting Pt Will Perform Lower Body Bathing: with set-up;with adaptive equipment;sitting/lateral leans Pt Will Perform Upper Body Dressing: with set-up;with caregiver independent in assisting;sitting Pt Will Perform Lower Body Dressing: with set-up;with caregiver independent in assisting;sit to/from stand Pt Will Transfer to Toilet: with supervision;ambulating Pt Will Perform Toileting - Clothing Manipulation and hygiene: with supervision;sit to/from stand  Plan Discharge plan remains appropriate    Co-evaluation    PT/OT/SLP Co-Evaluation/Treatment: Yes Reason for Co-Treatment: For patient/therapist safety;To address functional/ADL transfers PT goals addressed during session: Mobility/safety with mobility;Balance OT goals addressed during session: ADL's and self-care      AM-PAC OT  "6 Clicks" Daily Activity     Outcome Measure   Help from another person eating meals?: A Little Help from another person taking care of personal grooming?: A Little Help from another person toileting, which includes using toliet, bedpan, or urinal?: A Lot Help from another person bathing (including washing, rinsing, drying)?: A Lot Help from another person to put on and taking off regular upper body clothing?: A Little Help from another person to put on and taking off regular lower body clothing?: A Lot 6 Click Score: 15    End of Session Equipment Utilized During Treatment: Rolling walker;Gait belt  OT Visit Diagnosis: Unsteadiness on feet (R26.81);Other abnormalities of gait and mobility (R26.89);Muscle weakness (generalized) (M62.81);Other symptoms and signs involving cognitive function   Activity Tolerance Patient limited by fatigue;Patient limited by lethargy   Patient Left with call bell/phone within reach;with nursing/sitter in room;with family/visitor present;in chair   Nurse Communication Mobility status        Time: 0034-9179 OT Time Calculation (min): 23 min  Charges: OT General Charges $OT Visit: 1 Visit OT Treatments $Self Care/Home Management : 8-22 mins  [place], OTR/L Acute Rehab Pager: [phone] Office: [place] 03/23/2019, 4:21 PM

## 2019-03-23 NOTE — Consult Note (Signed)
[facility] Nurse Consult Note: Patient receiving care in [place].  Assisted with turning by primary RN. Reason for Consult: "DTI" Wound type: Abrasion to right buttock Pressure Injury POA: Yes/No/NA Measurement: To be provided by the bedside RN in the flowsheet section Wound bed: pink Drainage (amount, consistency, odor) none Periwound: hyperpigmented Dressing procedure/placement/frequency: Apply a small amount of [employer] Clear (purple and white tube in clean utility) over right buttock abrasion, top with a foam dressing. The foam dressing can remain in place up to 3 days. Monitor the wound area(s) for worsening of condition such as: Signs/symptoms of infection,  Increase in size,  Development of or worsening of odor, Development of pain, or increased pain at the affected locations.  Notify the medical team if any of these develop.  Thank you for the consult.  Discussed plan of care with the patient and bedside nurse.  [facility] nurse will not follow at this time.  Please re-consult the [facility] team if needed.  Val Riles, RN, MSN, CWOCN, CNS-BC, pager [phone]

## 2019-03-23 NOTE — Progress Notes (Signed)
Inpatient Rehabilitation-Admissions Coordinator   Met with pt and her husband at the bedside for rehab assessment. Pt kept her eyes closed during the majority of the conversation but was able to follow along and ask appropriate questions. Pt's husband initially interested in an outpatient clinic that she can do closer to her home in [place]. We reviewed differences between outpatient therapy and IP Rehab, with the patient open to pursuing CIR IF needed. As pt is not yet medically ready for IP Rehab, this AC will continue to follow up to answer questions about the program and determine if CIR is an appropriate venue for her post acute rehab.    Raechel Ache, OTR/L  Rehab Admissions Coordinator  [phone] 03/23/2019 1:49 PM

## 2019-03-23 NOTE — Plan of Care (Signed)
  Problem: Activity: Goal: Capacity to carry out activities will improve Outcome: Progressing   Problem: Cardiac: Goal: Ability to achieve and maintain adequate cardiopulmonary perfusion will improve Outcome: Progressing   Problem: Clinical Measurements: Goal: Ability to maintain clinical measurements within normal limits will improve Outcome: Progressing Goal: Respiratory complications will improve Outcome: Progressing Goal: Cardiovascular complication will be avoided Outcome: Progressing   Problem: Nutrition: Goal: Adequate nutrition will be maintained Outcome: Progressing   Problem: Coping: Goal: Level of anxiety will decrease Outcome: Progressing   Problem: Elimination: Goal: Will not experience complications related to bowel motility Outcome: Progressing   Problem: Pain Managment: Goal: General experience of comfort will improve Outcome: Progressing   Problem: Safety: Goal: Ability to remain free from injury will improve Outcome: Progressing

## 2019-03-23 NOTE — Progress Notes (Signed)
[name] ROUNDING NOTE   Subjective:   Is a 59 year old lady that was admitted 03/08/2019 with a history of chronic atrial fibrillation complete heart block status post pacemaker placement chronic systolic congestive heart failure with ejection fraction of 15 to 20% liver cirrhosis admitted with increasing swelling of legs and abdomen and a 30 pound weight gain.  She was found to have acute on chronic biventricular heart failure.  She was treated with CRRT with removal of 200 cc an hour initiated at 03/18/2019.  There is concern for cardiac amyloid this is being evaluated by cardiology bone marrow 03/11/2018 showed 14% plasma cells consistent with multiple myeloma  but LV morphology did not appear consistent with AL amyloid.  She is no longer on warfarin due to concern for warfarin skin necrosis.  Continues on IV heparin.  Blood pressure 88/66 pulse 72 temperature 97.7 O2 sats 96% room air  Sodium 127 potassium 5.0 chloride 94 CO2 21 BUN 29 creatinine 1.94 glucose 218 calcium 8.9 phosphorus 2.9 magnesium 2.8 WBC 5.15.5 hemoglobin 11.6 platelets 227  Allopurinol 150 mg daily, Neurontin 300 mg twice daily insulin sliding scale, midodrine 10 mg 3 times daily  Objective:  Vital signs in last 24 hours:  Temp:  [96.9 F (36.1 C)-97.7 F (36.5 C)] 97.7 F (36.5 C) (01/26 0400) Pulse Rate:  [56-118] 71 (01/26 0730) Resp:  [12-35] 20 (01/26 0730) BP: (84-125)/(52-102) 88/66 (01/26 0730) SpO2:  [90 %-100 %] 95 % (01/26 0730) Weight:  [67.9 kg] 67.9 kg (01/26 0356)  Weight change: -0.4 kg Filed Weights   03/21/19 0500 03/22/19 0500 03/23/19 0356  Weight: 70.8 kg 68.3 kg 67.9 kg    Intake/Output: I/O last 3 completed shifts: In: 2741.4 [P.O.:2232; I.V.:509.4] Out: 6521 [Other:6521]   Intake/Output this shift:  No intake/output data recorded. Alert nondistressed CVS- RRR JVP to jaw right IJ tunneled dialysis catheter 2/6 systolic murmur RS-clear to auscultation ABD- BS  present soft non-distended EXT-trace edema   Basic Metabolic Panel: Recent Labs  Lab 03/19/19 0513 03/19/19 1655 03/20/19 0440 03/20/19 1600 03/21/19 0452 03/21/19 0452 03/21/19 1603 03/21/19 1603 03/22/19 0500 03/22/19 1700 03/23/19 0405  NA 134*   < > 133*   < > 130*  --  130*  --  129* 126* 127*  K 4.4   < > 4.6   < > 4.9  --  4.6  --  5.0 4.8 5.0  CL 96*   < > 98   < > 94*  --  95*  --  93* 92* 94*  CO2 23   < > 22   < > 21*  --  21*  --  20* 22 21*  GLUCOSE 180*   < > 176*   < > 174*  --  228*  --  194* 257* 218*  BUN 26*   < > 25*   < > 25*  --  25*  --  25* 35* 29*  CREATININE 2.13*   < > 1.89*   < > 1.70*  --  1.74*  --  1.85* 2.23* 1.94*  CALCIUM 8.8*   < > 8.8*   < > 9.1   < > 8.9   < > 9.1 8.7* 8.9  MG 2.3  --  2.5*  --  2.6*  --   --   --  2.9*  --  2.8*  PHOS 3.4   < > 2.7   < > 2.7  --  2.6  --  2.9 3.1  2.9   < > = values in this interval not displayed.    Liver Function Tests: Recent Labs  Lab 03/21/19 0452 03/21/19 1603 03/22/19 0500 03/22/19 1700 03/23/19 0405  ALBUMIN 2.8* 2.9* 2.9* 2.7* 2.8*   No results for input(s): LIPASE, AMYLASE in the last 168 hours. No results for input(s): AMMONIA in the last 168 hours.  CBC: Recent Labs  Lab 03/19/19 0513 03/20/19 0440 03/21/19 0452 03/22/19 0500 03/23/19 0350  WBC 14.6* 13.5* 15.9* 16.1* 15.5*  HGB 10.3* 9.9* 10.7* 11.5* 11.6*  HCT 31.3* 29.6* 32.0* 34.8* 34.8*  MCV 84.1 82.9 82.3 84.5 83.5  PLT 235 222 251 248 227    Cardiac Enzymes: No results for input(s): CKTOTAL, CKMB, CKMBINDEX, TROPONINI in the last 168 hours.  BNP: Invalid input(s): POCBNP  CBG: Recent Labs  Lab 03/22/19 0755 03/22/19 1122 03/22/19 1514 03/22/19 2128 03/23/19 0633  GLUCAP 252* 295* 268* 163* 253*    Microbiology: Results for orders placed or performed during the hospital encounter of 03/07/19  Respiratory Panel by RT PCR (Flu A&B, Covid) - Nasopharyngeal Swab     Status: None   Collection Time:  03/07/19  9:21 PM   Specimen: Nasopharyngeal Swab  Result Value Ref Range Status   SARS Coronavirus 2 by RT PCR NEGATIVE NEGATIVE Final    Comment: (NOTE) SARS-CoV-2 target nucleic acids are NOT DETECTED. The SARS-CoV-2 RNA is generally detectable in upper respiratoy specimens during the acute phase of infection. The lowest concentration of SARS-CoV-2 viral copies this assay can detect is 131 copies/mL. A negative result does not preclude SARS-Cov-2 infection and should not be used as the sole basis for treatment or other patient management decisions. A negative result may occur with  improper specimen collection/handling, submission of specimen other than nasopharyngeal swab, presence of viral mutation(s) within the areas targeted by this assay, and inadequate number of viral copies (<131 copies/mL). A negative result must be combined with clinical observations, patient history, and epidemiological information. The expected result is Negative. Fact Sheet for Patients:  PinkCheek.be Fact Sheet for Healthcare Providers:  GravelBags.it This test is not yet ap proved or cleared by the [place] FDA and  has been authorized for detection and/or diagnosis of SARS-CoV-2 by FDA under an Emergency Use Authorization (EUA). This EUA will remain  in effect (meaning this test can be used) for the duration of the COVID-19 declaration under Section 564(b)(1) of the Act, 21 U.S.C. section 360bbb-3(b)(1), unless the authorization is terminated or revoked sooner.    Influenza A by PCR NEGATIVE NEGATIVE Final   Influenza B by PCR NEGATIVE NEGATIVE Final    Comment: (NOTE) The Xpert Xpress SARS-CoV-2/FLU/RSV assay is intended as an aid in  the diagnosis of influenza from Nasopharyngeal swab specimens and  should not be used as a sole basis for treatment. Nasal washings and  aspirates are unacceptable for Xpert Xpress SARS-CoV-2/FLU/RSV   testing. Fact Sheet for Patients: PinkCheek.be Fact Sheet for Healthcare Providers: GravelBags.it This test is not yet approved or cleared by the [place] FDA and  has been authorized for detection and/or diagnosis of SARS-CoV-2 by  FDA under an Emergency Use Authorization (EUA). This EUA will remain  in effect (meaning this test can be used) for the duration of the  Covid-19 declaration under Section 564(b)(1) of the Act, 21  U.S.C. section 360bbb-3(b)(1), unless the authorization is  terminated or revoked. Performed at [facility], [address] [place], [place] [address]   MRSA PCR Screening  Status: None   Collection Time: 03/08/19 10:26 PM   Specimen: Nasopharyngeal  Result Value Ref Range Status   MRSA by PCR NEGATIVE NEGATIVE Final    Comment:        The GeneXpert MRSA Assay (FDA approved for NASAL specimens only), is one component of a comprehensive MRSA colonization surveillance program. It is not intended to diagnose MRSA infection nor to guide or monitor treatment for MRSA infections. Performed at [facility], [place] [address] [place], [place] [address]    *Note: Due to a large number of results and/or encounters for the requested time period, some results have not been displayed. A complete set of results can be found in Results Review.    Coagulation Studies: Recent Labs    03/21/19 0452 03/22/19 0500 03/23/19 0350  LABPROT 20.7* 19.2* 18.9*  INR 1.8* 1.6* 1.6*    Urinalysis: No results for input(s): COLORURINE, LABSPEC, PHURINE, GLUCOSEU, HGBUR, BILIRUBINUR, KETONESUR, PROTEINUR, UROBILINOGEN, NITRITE, LEUKOCYTESUR in the last 72 hours.  Invalid input(s): APPERANCEUR    Imaging: No results found.   Medications:   .  prismasol BGK 4/2.5 800 mL/hr at 03/22/19 1757  .  prismasol BGK 4/2.5 200 mL/hr at 03/22/19 1125  . sodium chloride 10 mL/hr at 03/14/19 1615  . heparin  1,350 Units/hr (03/23/19 0700)  . prismasol BGK 4/2.5 1,000 mL/hr at 03/23/19 0805   . allopurinol  150 mg Oral q morning - 10a  . Chlorhexidine Gluconate Cloth  6 each Topical Q0600  . gabapentin  300 mg Oral BID  . insulin aspart  0-9 Units Subcutaneous TID WC  . lidocaine (PF)  5 mL Other Once  . lidocaine  10 mL Intradermal Once  . midodrine  10 mg Oral TID WC  . polyethylene glycol  17 g Oral BID  . senna  1 tablet Oral Daily  . sodium chloride flush  10-40 mL Intracatheter Q12H  . sodium chloride flush  3 mL Intravenous Q12H  . vitamin B-12  100 mcg Oral Daily   sodium chloride, acetaminophen **OR** acetaminophen, fentaNYL (SUBLIMAZE) injection, heparin, heparin, lidocaine, ondansetron **OR** ondansetron (ZOFRAN) IV, sodium chloride, sodium chloride, sodium chloride flush, sodium chloride flush  Assessment/ Plan:   Acute kidney injury on chronic kidney disease stage IV with a baseline creatinine about 3 mg/dL.  On admission 5 mg/dL thought to be secondary to cardiorenal syndrome.  Edema has significantly improved.  My preference would be to continue CRRT will discuss with cardiology.  Progression of renal disease could also be secondary to multiple myeloma and this would carry poor prognosis agree with Dr. Haroldine Laws that palliative medicine should be involved  Hypertension/volume continue volume removal.  CRRT since 03/18/2019-03/23/2019 t.  She is now having about negative balance 1.5 L per 24-hour.  Will transition to intermittent hemodialysis and discontinue CRRT 03/23/2019 she continues on midodrine 10 mg 3 times daily for blood pressure support  Warfarin skin necrosis biopsy negative for calciphylaxis would continue off warfarin as form of anticoagulation  Chronic atrial fibrillation IV heparin  Diabetes mellitus as per primary service  Multiple myeloma status post bone marrow biopsy consistent with this diagnosis  Palliative medicine to be consulted.   LOS: [place] @TODAY @8 :12 AM

## 2019-03-23 NOTE — Progress Notes (Signed)
[facility] Progress Note Patient Name: [name] DOB: 02/21/1961 MRN: [id number]   Date of Service  03/23/2019  HPI/Events of Note  Rising blood glucose >230 with sensitive SSI and no hs coverage   eICU Interventions  Change SSI to moderate and add hs coverage      Intervention Category Intermediate Interventions: Hyperglycemia - evaluation and treatment  [name] 03/23/2019, 9:25 PM

## 2019-03-24 ENCOUNTER — Inpatient Hospital Stay (HOSPITAL_COMMUNITY): Payer: Medicaid Other

## 2019-03-24 DIAGNOSIS — R531 Weakness: Secondary | ICD-10-CM

## 2019-03-24 LAB — CBC
HCT: 32 % — ABNORMAL LOW (ref 36.0–46.0)
Hemoglobin: 11 g/dL — ABNORMAL LOW (ref 12.0–15.0)
MCH: 28.1 pg (ref 26.0–34.0)
MCHC: 34.4 g/dL (ref 30.0–36.0)
MCV: 81.8 fL (ref 80.0–100.0)
Platelets: 199 10*3/uL (ref 150–400)
RBC: 3.91 MIL/uL (ref 3.87–5.11)
RDW: 20.3 % — ABNORMAL HIGH (ref 11.5–15.5)
WBC: 14.1 10*3/uL — ABNORMAL HIGH (ref 4.0–10.5)
nRBC: 1.6 % — ABNORMAL HIGH (ref 0.0–0.2)

## 2019-03-24 LAB — RENAL FUNCTION PANEL
Albumin: 2.6 g/dL — ABNORMAL LOW (ref 3.5–5.0)
Anion gap: 14 (ref 5–15)
BUN: 60 mg/dL — ABNORMAL HIGH (ref 6–20)
CO2: 19 mmol/L — ABNORMAL LOW (ref 22–32)
Calcium: 9.1 mg/dL (ref 8.9–10.3)
Chloride: 92 mmol/L — ABNORMAL LOW (ref 98–111)
Creatinine, Ser: 4.08 mg/dL — ABNORMAL HIGH (ref 0.44–1.00)
GFR calc Af Amer: 13 mL/min — ABNORMAL LOW (ref >60)
GFR calc non Af Amer: 11 mL/min — ABNORMAL LOW (ref >60)
Glucose, Bld: 263 mg/dL — ABNORMAL HIGH (ref 70–99)
Phosphorus: 4.3 mg/dL (ref 2.5–4.6)
Potassium: 5.8 mmol/L — ABNORMAL HIGH (ref 3.5–5.1)
Sodium: 125 mmol/L — ABNORMAL LOW (ref 135–145)

## 2019-03-24 LAB — PROTIME-INR
INR: 1.7 — ABNORMAL HIGH (ref 0.8–1.2)
Prothrombin Time: 19.6 seconds — ABNORMAL HIGH (ref 11.4–15.2)

## 2019-03-24 LAB — GLUCOSE, CAPILLARY
Glucose-Capillary: 245 mg/dL — ABNORMAL HIGH (ref 70–99)
Glucose-Capillary: 265 mg/dL — ABNORMAL HIGH (ref 70–99)
Glucose-Capillary: 278 mg/dL — ABNORMAL HIGH (ref 70–99)
Glucose-Capillary: 158 mg/dL — ABNORMAL HIGH (ref 70–99)
Glucose-Capillary: 308 mg/dL — ABNORMAL HIGH (ref 70–99)

## 2019-03-24 LAB — SURGICAL PATHOLOGY

## 2019-03-24 LAB — HEPARIN LEVEL (UNFRACTIONATED): Heparin Unfractionated: 0.48 IU/mL (ref 0.30–0.70)

## 2019-03-24 MED ORDER — NEPRO/CARBSTEADY PO LIQD
237.0000 mL | Freq: Three times a day (TID) | ORAL | Status: DC
  Administered 2019-03-24 – 2019-03-28 (×11): 237 mL via ORAL

## 2019-03-24 MED ORDER — PENTAFLUOROPROP-TETRAFLUOROETH EX AERO: 1.0000 "application " | INHALATION_SPRAY | CUTANEOUS | Status: DC | PRN

## 2019-03-24 MED ORDER — INSULIN GLARGINE 100 UNIT/ML ~~LOC~~ SOLN
5.0000 [IU] | Freq: Every day | SUBCUTANEOUS | Status: DC
  Administered 2019-03-24 – 2019-03-29 (×5): 5 [IU] via SUBCUTANEOUS
  Filled 2019-03-24 (×6): qty 0.05

## 2019-03-24 MED ORDER — HEPARIN SODIUM (PORCINE) 1000 UNIT/ML DIALYSIS
1000.0000 [IU] | INTRAMUSCULAR | Status: DC | PRN
  Filled 2019-03-24: qty 1

## 2019-03-24 MED ORDER — SODIUM CHLORIDE 0.9 % IV SOLN: 100.0000 mL | INTRAVENOUS | Status: DC | PRN

## 2019-03-24 MED ORDER — LIDOCAINE HCL (PF) 1 % IJ SOLN: 5.0000 mL | INTRAMUSCULAR | Status: DC | PRN

## 2019-03-24 MED ORDER — ALTEPLASE 2 MG IJ SOLR: 2.0000 mg | Freq: Once | INTRAMUSCULAR | Status: DC | PRN

## 2019-03-24 MED ORDER — MIDODRINE HCL 5 MG PO TABS
15.0000 mg | ORAL_TABLET | Freq: Three times a day (TID) | ORAL | Status: DC
  Administered 2019-03-24 – 2019-03-29 (×16): 15 mg via ORAL
  Filled 2019-03-24 (×17): qty 3

## 2019-03-24 MED ORDER — CHLORHEXIDINE GLUCONATE CLOTH 2 % EX PADS
6.0000 | MEDICATED_PAD | Freq: Every day | CUTANEOUS | Status: DC
  Administered 2019-03-24 – 2019-03-27 (×4): 6 via TOPICAL

## 2019-03-24 MED ORDER — LIDOCAINE-PRILOCAINE 2.5-2.5 % EX CREA
1.0000 "application " | TOPICAL_CREAM | CUTANEOUS | Status: DC | PRN
  Filled 2019-03-24: qty 5

## 2019-03-24 MED ORDER — TRAMADOL HCL 50 MG PO TABS
50.0000 mg | ORAL_TABLET | Freq: Two times a day (BID) | ORAL | Status: DC | PRN
  Administered 2019-03-24 – 2019-03-29 (×5): 50 mg via ORAL
  Filled 2019-03-24 (×5): qty 1

## 2019-03-24 MED ORDER — RENA-VITE PO TABS
1.0000 | ORAL_TABLET | Freq: Every day | ORAL | Status: DC
  Administered 2019-03-24 – 2019-03-28 (×5): 1 via ORAL
  Filled 2019-03-24 (×5): qty 1

## 2019-03-24 MED ORDER — SODIUM ZIRCONIUM CYCLOSILICATE 10 G PO PACK
10.0000 g | PACK | Freq: Every day | ORAL | Status: AC
  Administered 2019-03-24: 10 g via ORAL
  Filled 2019-03-24: qty 1

## 2019-03-24 MED ORDER — HEPARIN SODIUM (PORCINE) 1000 UNIT/ML IJ SOLN
INTRAMUSCULAR | Status: AC
  Administered 2019-03-24: 16:00:00 1000 [IU] via INTRAVENOUS
  Filled 2019-03-24: qty 3

## 2019-03-24 MED ORDER — SODIUM ZIRCONIUM CYCLOSILICATE 5 G PO PACK
5.0000 g | PACK | Freq: Once | ORAL | Status: AC
  Administered 2019-03-24: 5 g via ORAL
  Filled 2019-03-24: qty 1

## 2019-03-24 MED ORDER — SODIUM ZIRCONIUM CYCLOSILICATE 10 G PO PACK: 10.0000 g | PACK | Freq: Three times a day (TID) | ORAL | Status: DC

## 2019-03-24 NOTE — Progress Notes (Signed)
eLink Physician-Brief Progress Note Patient Name: [name] DOB: 1960/10/29 MRN: [id number]   Date of Service  03/24/2019  HPI/Events of Note  Pt fell while trying to go to the bathroom. She hit her head, and she is on Heparin.  eICU Interventions  Non-contrast head CT scan.        Kerry Kass Elyn Krogh 03/24/2019, 4:10 AM

## 2019-03-24 NOTE — Progress Notes (Signed)
Chaplain engaged in follow-up visit with [name].  Chaplain continues to offer support.   Chaplain will continue to follow-up.

## 2019-03-24 NOTE — Progress Notes (Addendum)
PULMONARY / CRITICAL CARE MEDICINE   NAME:  PAHOLA [name], MRN:  [id number], DOB:  1960-05-23, LOS: 73 ADMISSION DATE:  03/07/2019, CONSULTATION DATE:   REFERRING MD:  Dr. 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   SIGNIFICANT PAST MEDICAL HISTORY   Automatic implantable cardioverter-defibrillator in situ, Chronic anticoagulation, Chronic atrial fibrillation (HCC), Chronic kidney disease, Chronic systolic CHF (congestive heart failure) ([place]), Congenital third degree heart block, Dizziness and giddiness (05/19/2012), Dysphagia (08/14/2011), GERD (gastroesophageal reflux disease), Helicobacter pylori gastritis (08/14/2011), IDDM (insulin dependent diabetes mellitus), Kidney stones (1990's), Nonischemic cardiomyopathy ([place]), Potassium (K) excess, and Stroke ([place]) (1999).  SIGNIFICANT EVENTS:  1/10 admitted to [facility] for acute on chronic heart failure exacerbation  1/20 punch biopsy done  1/21 transferred to icu for acute hypoxic respiratory failure, placed on bipap, on crrt 1/22 CT chest without pe, was on 13L HFNC, got bipap intermittently, on crrt 1/26 Fell early morning in bathroom crrt stopped  STUDIES:   TTE 1/12  1. Left ventricular ejection fraction, by visual estimation, is <20%. The left ventricle has severely decreased function. There is no left ventricular hypertrophy.  2. Definity contrast agent was given IV to delineate the left ventricular endocardial borders.  3. Abnormal septal motion consistent with left bundle branch block.  4. Left ventricular diastolic parameters are consistent with Grade III diastolic dysfunction (restrictive).  5. Moderately dilated left ventricular  internal cavity size.  6. The left ventricle demonstrates global hypokinesis.  7. Global right ventricle has mildly reduced systolic function.The right ventricular size is severely enlarged. No increase in right ventricular wall thickness.  8. Left atrial size was severely dilated.  9. Right atrial size was severely dilated. 10. Trivial pericardial effusion is present. 11. The mitral valve is normal in structure. No evidence of mitral valve regurgitation. 12. The tricuspid valve is normal in structure. 13. The aortic valve is normal in structure. Aortic valve regurgitation is not visualized. Mild aortic valve sclerosis without stenosis. 14. Pulmonic regurgitation is moderate. 15. The pulmonic valve was normal in structure. Pulmonic valve regurgitation is moderate. 16. Mildly dilated pulmonary artery. 17. Mildly elevated pulmonary artery systolic pressure. 18. The tricuspid regurgitant velocity is 2.32 m/s, and with an assumed right atrial pressure of 15 mmHg, the estimated right ventricular systolic pressure is mildly elevated at 36.6 mmHg. 19. A pacer wire is visualized. 20. The inferior vena cava is dilated in size with <50% respiratory variability, suggesting right atrial pressure of 15 mmHg. 21. No intracardiac thrombi or masses were visualized. 22. No significant change from prior study (April 10, 2017). 23. Prior images reviewed side by side  Right heart cath 03/09/2019 1. Right > left heart failure with low PAPI.  2. Pulmonary venous hypertension.  3. Adequate cardiac output on dobutamine 2.5 mcg/kg/min  CT chest 1/22 IMPRESSION: 1. Moderate cardiomegaly.  One vessel coronary atherosclerosis. 2. Dilated main pulmonary artery, suggesting pulmonary arterial hypertension. 3. Mild platelike scarring versus atelectasis at the left lung base. Otherwise no active pulmonary disease. No pleural effusions. 4. Nonspecific mild left prevascular mediastinal lymphadenopathy. 5. Ectatic  4.4 cm ascending thoracic aorta. Recommend annual imaging followup by CTA or MRA. This recommendation follows 2010 ACCF/AHA/AATS/ACR/ASA/SCA/SCAI/SIR/STS/SVM Guidelines for  the Diagnosis and Management of Patients with Thoracic Aortic Disease. Circulation. 2010; 121: W803-O122. Aortic aneurysm NOS (ICD10-I71.9). 6. Finely irregular liver surface, cannot exclude cirrhosis.  Aortic Atherosclerosis (ICD10-I70.0).  CULTURES:   MRSA PCR 1/11 negative  Respiratory viral panel 1/10 neg covid, neg flu a and b   ANTIBIOTICS:    LINES/TUBES:  Peripheral IV right hand 1/15 Peripheral Iv left forearm 1/17  CONSULTANTS:  Nephrology Heart Failure  Surgery  SUBJECTIVE:  Overnight the patient fell on her way to the bathroom and head CT was ordered.   CONSTITUTIONAL: BP (!) 94/54   Pulse (!) 59   Temp 98.5 F (36.9 C) (Oral)   Resp (!) 29   Ht 5' 6"  (1.676 m)   Wt 67.9 kg   SpO2 97%   BMI 24.16 kg/m   I/O last 3 completed shifts: In: 2322.4 [P.O.:1917; I.V.:405.4] Out: 3670 [Other:3670]  CVP:  [4 mmHg-10 mmHg] 6 mmHg  PHYSICAL EXAM: General: In no acute distress Neuro: alert, somnolent HEENT: no conjunctival injection Cardiovascular: rrr, s1 and s2 audible Lungs: ctab, no rales or rhonchi  Abdomen: firm throughout.  Musculoskeletal: no edema  Skin: warm extremities  ASSESSMENT AND PLAN    Cardiorenal syndrome Acute on chronic renal failure ESRD Thought to be secondary to diabetes mellitus, cardiorenal syndrome, MGUS. Patient is accepted for OP HD at [facility] kidney center TTS 11:45am.   Patient's creatinine increased significantly from 1.94 to 4.08. She also has electrolyte derangements with hyponatremia and hyperkalemia. CVVHD was stopped 1/26. Patient is dialysis dependent with severe cardiorenal syndrome which is a poor prognostic sign.  Patient is to get intermittent HD   -Palliative care following to discuss goals of care. She was changed to a  limited code with no cpr, compressions, or intubations but to leave aicd activated.  -Appreciate nephrology following -lokelma for hyperkalemia   Recent fall  Head ct was done which was negative for acute intracranial abnormality   Acute hypoxemic respiratory failure Acute respiratory alkalosis Thought to be secondary to pulmonary edema. Patient has been breathing on room air.   -Will continue to monitor respiratory needs   Acute on chronic systolic heart failure, QM<25% Longstanding NICM [employer] CRT-D CVP is 4 this morning. Patient unable to be on optimal medical therapy due to hypotension and renal function.  -follow cvp -continue midodrine 51m tid, consider dose when on dialysis  -Continue cvvhd -cannot use digoxin, acei/arb due to aki  -cannot use b blocker due to concern for low output  -hypotensive so cannot do nitrates/hydralazine  Hypervolemic Hyponatremia Na 125  -continue with dialysis   Leukocytosis  Leukocytosis improving 16>15>14, afebrile. Likely secondary to stress.  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  Protein electrophoresis showing M spike 1.2%, albumin 2.6. Bone marrow biopsy showing 14% plasma cells. Oncology plans to do chemotherapy outpatient.   -appreciate oncology following   Chronic atrial fibrillation   -continue heparin   Diabetes Mellitus  Patient's blood sugars have been ranging 200-300s over the past 24 hrs. On nph 22u qd at home.   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 Punch biopsy done by general surgery 1/20 resulted no morphological evidence of calciphylaxis. 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   -stop warfarin -gabapentin 3049mbid   Abdominal  tenderness  Constipation  CT abdomen done 1/11 does not show any acute abnormality. Has been having regular bowel movements on laxatives.   Patient has large stool burden, has not had bm in 4 days.   -miralax bid  -senna qd -please document bm  Left Renal Cyst  1.5cm in size simple exophytic seen on CT chest 1/22  Ectatic ascending thoracic aorta  4.4cm in size.   -annual follow up  Best Practice / Goals of Care / Disposition.   DVT PROPHYLAXIS: heparin SUP: n/a NUTRITION: renal diet MOBILITY: to work with PT GOALS OF CARE: Full code FAMILY DISCUSSIONS: Updated patient and husband at bedside DISPOSITION ICU  LABS  Glucose Recent Labs  Lab 03/23/19 0649 03/23/19 1242 03/23/19 1519 03/23/19 2100 03/24/19 0521 03/24/19 0630  GLUCAP 238* 335* 320* 232* 245* 265*    BMET Recent Labs  Lab 03/22/19 1700 03/23/19 0405 03/24/19 0240  NA 126* 127* 125*  K 4.8 5.0 5.8*  CL 92* 94* 92*  CO2 22 21* 19*  BUN 35* 29* 60*  CREATININE 2.23* 1.94* 4.08*  GLUCOSE 257* 218* 263*    Liver Enzymes Recent Labs  Lab 03/22/19 1700 03/23/19 0405 03/24/19 0240  ALBUMIN 2.7* 2.8* 2.6*    Electrolytes Recent Labs  Lab 03/21/19 0452 03/21/19 1603 03/22/19 0500 03/22/19 0500 03/22/19 1700 03/23/19 0405 03/24/19 0240  CALCIUM 9.1   < > 9.1   < > 8.7* 8.9 9.1  MG 2.6*  --  2.9*  --   --  2.8*  --   PHOS 2.7   < > 2.9   < > 3.1 2.9 4.3   < > = values in this interval not displayed.    CBC Recent Labs  Lab 03/22/19 0500 03/23/19 0350 03/24/19 0230  WBC 16.1* 15.5* 14.1*  HGB 11.5* 11.6* 11.0*  HCT 34.8* 34.8* 32.0*  PLT 248 227 199    ABG Recent Labs  Lab 03/18/19 1030 03/18/19 1327  PHART 7.463* 7.431  PCO2ART 29.9* 36.2  PO2ART 52.0* 131.0*    Coag's Recent Labs  Lab 03/22/19 0500 03/23/19 0350 03/24/19 0230  INR 1.6* 1.6* 1.7*    Sepsis Markers No results for input(s): LATICACIDVEN, PROCALCITON, O2SATVEN in the last 168 hours.  Cardiac  Enzymes No results for input(s): TROPONINI, PROBNP in the last 168 hours.  REVIEW OF SYSTEMS:    Patient states that she has pain in her bilateral legs.

## 2019-03-24 NOTE — Progress Notes (Signed)
eLink Physician-Brief Progress Note Patient Name: [name] DOB: 01-22-1961 MRN: [id number]   Date of Service  03/24/2019  HPI/Events of Note  Pt c/o BLE pain. No relief with tylenol.  Per RN low dose fentanyl given this am made her very lethargic.  Now awake and alert.   eICU Interventions  D/c fentanyl  Ultram 50mg  PO q12h PRN pain      Intervention Category Minor Interventions: Routine modifications to care plan (e.g. PRN medications for pain, fever)  [name] 03/24/2019, 9:30 PM

## 2019-03-24 NOTE — Plan of Care (Signed)
  Problem: Activity: Goal: Capacity to carry out activities will improve Outcome: Progressing   Problem: Cardiac: Goal: Ability to achieve and maintain adequate cardiopulmonary perfusion will improve Outcome: Progressing   Problem: Clinical Measurements: Goal: Will remain free from infection Outcome: Progressing   Problem: Activity: Goal: Risk for activity intolerance will decrease Outcome: Progressing   Problem: Nutrition: Goal: Adequate nutrition will be maintained Outcome: Progressing   Problem: Pain Managment: Goal: General experience of comfort will improve Outcome: Progressing   Problem: Education: Goal: Ability to demonstrate management of disease process will improve Outcome: Not Progressing   Problem: Elimination: Goal: Will not experience complications related to bowel motility Outcome: Not Progressing   Problem: Safety: Goal: Ability to remain free from injury will improve Outcome: Not Progressing   Problem: Skin Integrity: Goal: Risk for impaired skin integrity will decrease Outcome: Not Progressing

## 2019-03-24 NOTE — Progress Notes (Signed)
Inpatient Diabetes Program Recommendations  AACE/ADA: New Consensus Statement on Inpatient Glycemic Control   Target Ranges:  Prepandial:   less than 140 mg/dL      Peak postprandial:   less than 180 mg/dL (1-2 hours)      Critically ill patients:  140 - 180 mg/dL  Results for LARETA, BRUNEAU (MRN [id number]) as of 03/24/2019 10:37  Ref. Range 03/23/2019 06:49 03/23/2019 12:42 03/23/2019 15:19 03/23/2019 21:00 03/24/2019 05:21 03/24/2019 06:30  Glucose-Capillary Latest Ref Range: 70 - 99 mg/dL 238 (H)  Novolog 5 units 335 (H)  Novolog 7 units 320 (H)  Novolog 7 units 232 (H)  Novolog 2 units 245 (H) 265 (H)  Novolog 8 units  Lantus 5 units    Review of Glycemic Control  Outpatient Diabetes medications: NPH 32 units QAM, Novolog 02/08/15 TID with meals, Victoza 1.2 mg daily Current orders for Inpatient glycemic control: Lantus 5 units daily, Novolog 0-15 units TID with meals, Novolog 0-5 units QHS  Inpatient Diabetes Program Recommendations:    Insulin-Basal: Noted Lantus 5 units daily ordered today. Please consider increasing to Lantus 5 units BID.  Insulin-Meal Coverage: Please consider ordering Novolog 3 units TID with meals for meal coverage if patient eats at least 50% of meals.  Thanks, Barnie Alderman, RN, MSN, CDE Diabetes Coordinator Inpatient Diabetes Program [phone] (Team Pager from 8am to 5pm)

## 2019-03-24 NOTE — Progress Notes (Signed)
Patient ID: [name], female   DOB: Jan 05, 1961, 59 y.o.   MRN: [id number]  This NP visited patient at the bedside as a follow up for palliative medicine needs and emotional support.     Husband at bedside.    Patient is lethargic, but can be aroused with stimulation.   Patient and her husband verbalize appreciation for yesterdays conversation/GOCs meeting.    They continue to share today their love ad appreciation for each other and family.   Family facetime and video chat often.  Patient is open to all offered and available medical interventions to prolong quality of life.     Active listening and presence  Discussed with patient and her husband  the importance of continued conversation with each other  and the  medical providers regarding overall plan of care and treatment options,  ensuring decisions are within the context of the patients values and GOCs.  Plan of care: - limited code/ no CPR, compression, intubation.  Patient requests to leave AICD activated -continue to treat the treatable and hope for improvement   Questions and concerns addressed     PMT will continue to support holistically  Total time spent on the unit was 25 minutes  Greater than 50% of the time was spent in counseling and coordination of care  Wadie Lessen NP  Palliative Medicine Team Team Phone # [phone] Pager [phone]

## 2019-03-24 NOTE — Progress Notes (Signed)
At approx 0355 [name] attempted to walk to the bathroom from her bed and fell to the floor from the bathroom. The RN heard her yell and came to the bedside finding her on the floor with another RN coming in thereafter. [name] stated that she was trying to go to the bathroom and asked where her husband was at the time. She stated that she hit the top left side of her head. The RN assessed her head to find no bruising, no swelling, and no bleeding. The RN reoriented her. The RN helped [name] get back in the bed along with another RN. Heparin was then stopped. [name] was then reassessed. Charge RN was notified of the events and came to the bedside. She suggested calling [facility] so they could be updated on the status of [name]. This was immediately completed and Wyn Forster, MD called back after giving the [facility] RN an update of the recent event. Wyn Forster, MD suggested that a CT Head be completed. Post fall Algorithm has been implemented. Please see noted for Post Fall Assessment. High Risk precautions have been implemented with a falls risk band, floor mats at the bedside, pt now on a  low bed. [facility] RN, [name] stated it is okay to restart heparin gtt due to current CT result of Negative. Family has bee updated on events and current status. Will continue to monitor.

## 2019-03-24 NOTE — Progress Notes (Signed)
Assisted tele visit to patient with family member.  Kimm Sider Anderson, RN   

## 2019-03-24 NOTE — Progress Notes (Signed)
Patient ID: [name], female   DOB: September 23, 1960, 59 y.o.   MRN: [id number]     Advanced Heart Failure Rounding Note  PCP-Cardiologist: Kate Sable, MD   Subjective:    CVVH stopped yesterday, she is off supplemental oxygen.  CVP 10 this morning.   Had Fentanyl yesterday for abdominal/leg pain, very drowsy this morning. Did some walking with PT yesterday, weak.   Fall last night walking to bathroom, head CT negative, heparin held.   New diagnosis of multiple myeloma. Bx for possible calciphylaxis was negative (? false negative).   Echo: EF < 20%, no LVH, mildly reduced RV systolic function, severe biatrial enlargement, severe TR.  RHC Procedural Findings (dobutamine 2.5 mcg/kg/min): Hemodynamics (mmHg) RA 18 RV 43/18 PA 45/19, mean 28 PCWP mean 19 Oxygen saturations: PA 63% AO 94% Cardiac Output (Fick) 6.1  Cardiac Index (Fick) 3.06 PVR 1.5 WU PAPI 1.4  CT chest w/o contrast: Atelectasis, no significant pulmonary disease.    Objective:   Weight Range: 67.9 kg Body mass index is 24.16 kg/m.   Vital Signs:   Temp:  [97.4 F (36.3 C)-98.5 F (36.9 C)] 98.3 F (36.8 C) (01/27 0750) Pulse Rate:  [58-62] 59 (01/27 0700) Resp:  [13-33] 19 (01/27 0700) BP: (80-120)/(47-70) 87/58 (01/27 0700) SpO2:  [88 %-99 %] 99 % (01/27 0700) Last BM Date: 03/22/19  Weight change: Filed Weights   03/21/19 0500 03/22/19 0500 03/23/19 0356  Weight: 70.8 kg 68.3 kg 67.9 kg    Intake/Output:   Intake/Output Summary (Last 24 hours) at 03/24/2019 0833 Last data filed at 03/24/2019 0000 Gross per 24 hour  Intake 1004.33 ml  Output 0 ml  Net 1004.33 ml      Physical Exam   General: NAD, drowsy Neck: No JVD, no thyromegaly or thyroid nodule.  Lungs: Clear to auscultation bilaterally with normal respiratory effort. CV: Lateral PMI.  Heart regular S1/S2, no S3/S4, no murmur.  No peripheral edema.   Abdomen: Soft, nontender, no hepatosplenomegaly, no distention.  Skin:  Intact without lesions or rashes.  Neurologic: Alert and oriented x 3.  Psych: Normal affect. Extremities: No clubbing or cyanosis.  HEENT: Normal.    Telemetry   A fib BiV pacing 60 Personally reviewed    Labs    CBC Recent Labs    03/23/19 0350 03/24/19 0230  WBC 15.5* 14.1*  HGB 11.6* 11.0*  HCT 34.8* 32.0*  MCV 83.5 81.8  PLT 227 803   Basic Metabolic Panel Recent Labs    03/22/19 0500 03/22/19 1700 03/23/19 0405 03/24/19 0240  NA 129*   < > 127* 125*  K 5.0   < > 5.0 5.8*  CL 93*   < > 94* 92*  CO2 20*   < > 21* 19*  GLUCOSE 194*   < > 218* 263*  BUN 25*   < > 29* 60*  CREATININE 1.85*   < > 1.94* 4.08*  CALCIUM 9.1   < > 8.9 9.1  MG 2.9*  --  2.8*  --   PHOS 2.9   < > 2.9 4.3   < > = values in this interval not displayed.   Liver Function Tests Recent Labs    03/23/19 0405 03/24/19 0240  ALBUMIN 2.8* 2.6*   No results for input(s): LIPASE, AMYLASE in the last 72 hours. Cardiac Enzymes No results for input(s): CKTOTAL, CKMB, CKMBINDEX, TROPONINI in the last 72 hours.  BNP: BNP (last 3 results) Recent Labs    03/07/19 2101  BNP 1,072.0*    ProBNP (last 3 results) No results for input(s): PROBNP in the last 8760 hours.   D-Dimer No results for input(s): DDIMER in the last 72 hours. Hemoglobin A1C No results for input(s): HGBA1C in the last 72 hours. Fasting Lipid Panel No results for input(s): CHOL, HDL, LDLCALC, TRIG, CHOLHDL, LDLDIRECT in the last 72 hours. Thyroid Function Tests No results for input(s): TSH, T4TOTAL, T3FREE, THYROIDAB in the last 72 hours.  Invalid input(s): FREET3  Other results:   Imaging    CT HEAD WO CONTRAST  Result Date: 03/24/2019 CLINICAL DATA:  Fall on heparin. EXAM: CT HEAD WITHOUT CONTRAST TECHNIQUE: Contiguous axial images were obtained from the base of the skull through the vertex without intravenous contrast. COMPARISON:  03/13/2012 FINDINGS: Brain: No evidence of acute infarction, hemorrhage,  hydrocephalus, extra-axial collection or mass lesion/mass effect. Vascular: No hyperdense vessel or unexpected calcification. Skull: Normal. Negative for fracture or focal lesion. Sinuses/Orbits: No acute finding. IMPRESSION: Negative head CT. Electronically Signed   By: Monte Fantasia M.D.   On: 03/24/2019 04:58     Medications:     Scheduled Medications: . allopurinol  150 mg Oral q morning - 10a  . Chlorhexidine Gluconate Cloth  6 each Topical Q0600  . Chlorhexidine Gluconate Cloth  6 each Topical Q0600  . gabapentin  300 mg Oral BID  . insulin aspart  0-15 Units Subcutaneous TID WC  . insulin aspart  0-5 Units Subcutaneous QHS  . insulin glargine  5 Units Subcutaneous Daily  . lidocaine (PF)  5 mL Other Once  . lidocaine  10 mL Intradermal Once  . midodrine  15 mg Oral TID WC  . polyethylene glycol  17 g Oral BID  . senna  1 tablet Oral Daily  . sodium chloride flush  10-40 mL Intracatheter Q12H  . sodium chloride flush  3 mL Intravenous Q12H  . sodium zirconium cyclosilicate  10 g Oral Daily  . vitamin B-12  100 mcg Oral Daily    Infusions: . sodium chloride 10 mL/hr at 03/14/19 1615  . heparin 1,350 Units/hr (03/24/19 0827)    PRN Medications: sodium chloride, acetaminophen **OR** acetaminophen, fentaNYL (SUBLIMAZE) injection, heparin, heparin, lidocaine, ondansetron **OR** ondansetron (ZOFRAN) IV, sodium chloride, sodium chloride flush, sodium chloride flush    Assessment/Plan   1. Acute Hypoxic Respiratory Failure: Resolved, off oxygen. 2. 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  Last echo in 2/19 with EF 15-20%. She has a [employer] CRT-D device. Admitted with progressive dyspnea and 30 lb weight gain. Empirically started on dobutamine 2.5 due to concern for low output. RHC showed R>L heart failure with low PAPI adequate cardiac output on dobutamine. Now off dobutamine.  Initially started on iHD after she failed high dose diuretics,  then CVVH with intractable volume overload.  Weight down 50 lbs from baseline, now off CVVH.  CVP 10 today. She will be HD dependent.   - Increase midodrine to 15 mg tid with relative hypotension and need for iHD.  - Will try iHD today to see if BP will tolerate.  - Stopped digoxin and benazepril with AKI.  - Stopped beta blocker with concern for low output.  - No BP room for hydralazine/nitrates.  - Cardiac amyloidosis is a concern with myeloma, but echo myocardial appearance is not consistent. Bone marrow biopsy does not appear to have been stained for amyloid => we have asked pathology to stain the bone marrow biopsy for amyloidosis.   -  LE u/s negative for DVT 2. AKI on CKD stage 4: Baseline creatinine around 3, went up to 5 with BUN 150 this admission. Suspect cardiorenal syndrome superimposed on possible diabetic nephropathy. She will be dialysis-dependent going forward. - iHD today, concern that she may not tolerate with relative hypotension.  Midodrine increased.   - Nephrology assistance appreciated.  3. Chronic atrial fibrillation: Warfarin held for procedures, now on heparin gtt. Long-term, will transition to apixaban with some concern for warfarin skin necrosis.    - heparin held after fall, head CT negative, restart heparin gtt.  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  5. DM: Per primary.  6. Monoclonal gammopathy:  LV morphology does not look consistent with AL amyloidosis. She has been seen by hematology, has had bone marrow bx showing 14% plasma cells by flow cytometry on the aspirate which is by definition multiple myeloma. Seen by heme/onc, plan for outpatient treatment.  BM bx does not appear to have been stained for amyloid => asked pathology to stain and pending result.  7. Tricuspid regurgitation: Severe by echo.  8.  Subcutaneous nodules: ?calciphylaxis versus less likely warfarin skin necrosis.  - Off warfarin, will use Eliquis in future.  - Bx negative for  calciphylaxis. (? False negative)  - Renal managing for possible calciphylaxis.  9. Deconditioning: Needs aggressive PT and possibly to CIR eventually.  10. Poor prognosis.  Not sure she will tolerate iHD with relative hypotension.  Palliative care following, she is now a limited code.    Length of Stay: [place], MD  03/24/2019, 8:33 AM  Advanced Heart Failure Team Pager [phone] (M-F; 7a - 4p)  Please contact [facility] for night-coverage after hours (4p -7a ) and weekends on [email or web address]

## 2019-03-24 NOTE — Progress Notes (Signed)
Initial Nutrition Assessment  DOCUMENTATION CODES:   Not applicable  INTERVENTION:    Nepro Shake po TID, each supplement provides 425 kcal and 19 grams protein  Renal MVI daily   NUTRITION DIAGNOSIS:   Increased nutrient needs related to acute illness as evidenced by estimated needs.  GOAL:   Patient will meet greater than or equal to 90% of their needs  MONITOR:   PO intake, Supplement acceptance, Weight trends, Labs, I & O's, Skin  REASON FOR ASSESSMENT:   Rounds    ASSESSMENT:   Patient with PMH significant for a.fib, complete heart black s/p ICD, non ischemic cardiomyopathy, CHF, CKD IV, DM, CVA, and liver cirrhosis. Presents this admission with acute CHF exacerbation and AKI.   1/15- start HD 1/20- s/p punch biopsy shows no evidence of calciphylaxis  1/21- tx ICU due to worse resp status, start CRRT  Unable to reach pt by phone. Meal completion charted as 50-85% for her last eight meals (average 75%). RD to provide supplementation to maximize kcal and protein this admission.   Weight shows to decrease from 81.7 kg on 01/14/2019 to 67.9 kg this admission. Unable to determine dry wt loss vs fluid fluctuation. Will need to obtain nutrition/weight history form pt if possible.   Admission weight: 90.1 kg  Current weight: 67.9 kg   I/O: -27,163 ml since 1/13  UOP: anuric CRRT: 224 ml x 24 hrs   Medications: SS novolog, lantus, miralax, senokot, lokelma, Vit B12 Labs: Na 125 (L) K 5.8 (H) CBG 232-335 Cr 4.08-trending up  Diet Order:   Diet Order            Diet renal with fluid restriction Fluid restriction: 1200 mL Fluid; Room service appropriate? Yes; Fluid consistency: Thin  Diet effective now              EDUCATION NEEDS:   Not appropriate for education at this time  Skin:  Skin Assessment: Skin Integrity Issues: Skin Integrity Issues:: Incisions, DTI, Other (Comment) DTI: buttocks Incisions: hip Other: L thigh skin opening- s/p punch  biopsy  Last BM:  1/25  Height:   Ht Readings from Last 1 Encounters:  03/08/19 5\' 6"  (1.676 m)    Weight:   Wt Readings from Last 1 Encounters:  03/23/19 67.9 kg    Ideal Body Weight:  59.1 kg  BMI:  Body mass index is 24.16 kg/m.  Estimated Nutritional Needs:   Kcal:  1900-2100 kcal  Protein:  95-115 grams  Fluid:  1000 ml + UOP (liberalized with CRRT)   Mariana Single RD, LDN Clinical Nutrition Pager # [phone]

## 2019-03-24 NOTE — Progress Notes (Signed)
ANTICOAGULATION CONSULT NOTE - [place] for heparin Indication: atrial fibrillation  Allergies  Allergen Reactions  . Wheat Swelling  . Latex Rash  . Penicillins Rash  . Sulfa Antibiotics Rash    Patient Measurements: Height: 5\' 6"  (167.6 cm) Weight: 149 lb 11.1 oz (67.9 kg) IBW/kg (Calculated) : 59.3  Vital Signs: Temp: 98.3 F (36.8 C) (01/27 0750) Temp Source: Oral (01/27 0750) BP: 99/55 (01/27 0900) Pulse Rate: 60 (01/27 0900)  Labs: Recent Labs    03/22/19 0500 03/22/19 0500 03/22/19 1700 03/23/19 0350 03/23/19 0405 03/23/19 0406 03/24/19 0230 03/24/19 0240  HGB 11.5*   < >  --  11.6*  --   --  11.0*  --   HCT 34.8*  --   --  34.8*  --   --  32.0*  --   PLT 248  --   --  227  --   --  199  --   LABPROT 19.2*  --   --  18.9*  --   --  19.6*  --   INR 1.6*  --   --  1.6*  --   --  1.7*  --   HEPARINUNFRC 0.75*   < > 0.43  --   --  0.64 0.48  --   CREATININE 1.85*   < > 2.23*  --  1.94*  --   --  4.08*   < > = values in this interval not displayed.    Estimated Creatinine Clearance: 13.9 mL/min (A) (by C-G formula based on SCr of 4.08 mg/dL (H)).   Assessment: 59 yo female with hx AFib, Coumadin on hold, continue heparin bridge.   Heparin level is therapeutic at 0.48 this morning. Pt fell overnight, heparin infusion was held and resumed after CT head negative. No bleeding or bruising noted on the head per RN. CBC fairly stable.  Goal of Therapy:  Heparin level goal 0.3-0.7 Monitor platelets by anticoagulation protocol: Yes   Plan:  Continue IV heparin 1350 units/hr. Daily heparin level and CBC. F/u transition to [place], PharmD PGY1 Pharmacy Resident  Please check AMION for all [place] phone numbers After 10:00 PM, call [place] [phone]   03/24/2019 9:48 AM

## 2019-03-24 NOTE — Progress Notes (Addendum)
[facility] ROUNDING NOTE   Subjective:   Is a 59 year old lady that was admitted 03/08/2019 with a history of chronic atrial fibrillation complete heart block status post pacemaker placement chronic systolic congestive heart failure with ejection fraction of 15 to 20% liver cirrhosis admitted with increasing swelling of legs and abdomen and a 30 pound weight gain.  She was found to have acute on chronic biventricular heart failure.  She was treated with CRRT 03/18/2019-03/23/2019.  Marland Kitchen  There is concern for cardiac amyloid this is being evaluated by cardiology bone marrow 03/11/2018 showed 14% plasma cells consistent with multiple myeloma  but LV morphology did not appear consistent with AL amyloid.  She is no longer on warfarin due to concern for warfarin skin necrosis.  Continues on IV heparin.  Blood pressure 95/60 pulse 60 temperature 98.3 O2 sats 98% room air  Sodium 125 potassium 5.8 chloride 92 CO2 19 BUN 60 creatinine 4 glucose 263 calcium 9.1 phosphorus 4.3 albumin 2.6 WBC 14.1 hemoglobin 11 platelets 199  No urine output  Allopurinol 150 mg daily, Neurontin 300 mg twice daily insulin sliding scale, glargine 5 units, midodrine 10 mg 3 times daily.  Lokelma administered 5 g.  Objective:  Vital signs in last 24 hours:  Temp:  [97.4 F (36.3 C)-98.5 F (36.9 C)] 98.3 F (36.8 C) (01/27 0750) Pulse Rate:  [58-62] 59 (01/27 0700) Resp:  [13-33] 19 (01/27 0700) BP: (80-120)/(47-70) 87/58 (01/27 0700) SpO2:  [88 %-99 %] 99 % (01/27 0700)  Weight change:  Filed Weights   03/21/19 0500 03/22/19 0500 03/23/19 0356  Weight: 70.8 kg 68.3 kg 67.9 kg    Intake/Output: I/O last 3 completed shifts: In: 2322.4 [P.O.:1917; I.V.:405.4] Out: 3670 [Other:3670]   Intake/Output this shift:  No intake/output data recorded. Alert nondistressed CVS- RRR JVP to jaw right IJ tunneled dialysis catheter 2/6 systolic murmur RS-clear to auscultation ABD- BS present soft  non-distended EXT-trace edema   Basic Metabolic Panel: Recent Labs  Lab 03/19/19 0513 03/19/19 1655 03/20/19 0440 03/20/19 1600 03/21/19 0452 03/21/19 0452 03/21/19 1603 03/21/19 1603 03/22/19 0500 03/22/19 0500 03/22/19 1700 03/23/19 0405 03/24/19 0240  NA 134*   < > 133*   < > 130*   < > 130*  --  129*  --  126* 127* 125*  K 4.4   < > 4.6   < > 4.9   < > 4.6  --  5.0  --  4.8 5.0 5.8*  CL 96*   < > 98   < > 94*   < > 95*  --  93*  --  92* 94* 92*  CO2 23   < > 22   < > 21*   < > 21*  --  20*  --  22 21* 19*  GLUCOSE 180*   < > 176*   < > 174*   < > 228*  --  194*  --  257* 218* 263*  BUN 26*   < > 25*   < > 25*   < > 25*  --  25*  --  35* 29* 60*  CREATININE 2.13*   < > 1.89*   < > 1.70*   < > 1.74*  --  1.85*  --  2.23* 1.94* 4.08*  CALCIUM 8.8*   < > 8.8*   < > 9.1   < > 8.9   < > 9.1   < > 8.7* 8.9 9.1  MG 2.3  --  2.5*  --  2.6*  --   --   --  2.9*  --   --  2.8*  --   PHOS 3.4   < > 2.7   < > 2.7   < > 2.6  --  2.9  --  3.1 2.9 4.3   < > = values in this interval not displayed.    Liver Function Tests: Recent Labs  Lab 03/21/19 1603 03/22/19 0500 03/22/19 1700 03/23/19 0405 03/24/19 0240  ALBUMIN 2.9* 2.9* 2.7* 2.8* 2.6*   No results for input(s): LIPASE, AMYLASE in the last 168 hours. No results for input(s): AMMONIA in the last 168 hours.  CBC: Recent Labs  Lab 03/20/19 0440 03/21/19 0452 03/22/19 0500 03/23/19 0350 03/24/19 0230  WBC 13.5* 15.9* 16.1* 15.5* 14.1*  HGB 9.9* 10.7* 11.5* 11.6* 11.0*  HCT 29.6* 32.0* 34.8* 34.8* 32.0*  MCV 82.9 82.3 84.5 83.5 81.8  PLT 222 251 248 227 199    Cardiac Enzymes: No results for input(s): CKTOTAL, CKMB, CKMBINDEX, TROPONINI in the last 168 hours.  BNP: Invalid input(s): POCBNP  CBG: Recent Labs  Lab 03/23/19 1242 03/23/19 1519 03/23/19 2100 03/24/19 0521 03/24/19 0630  GLUCAP 335* 320* 232* 245* 265*    Microbiology: Results for orders placed or performed during the hospital encounter of  03/07/19  Respiratory Panel by RT PCR (Flu A&B, Covid) - Nasopharyngeal Swab     Status: None   Collection Time: 03/07/19  9:21 PM   Specimen: Nasopharyngeal Swab  Result Value Ref Range Status   SARS Coronavirus 2 by RT PCR NEGATIVE NEGATIVE Final    Comment: (NOTE) SARS-CoV-2 target nucleic acids are NOT DETECTED. The SARS-CoV-2 RNA is generally detectable in upper respiratoy specimens during the acute phase of infection. The lowest concentration of SARS-CoV-2 viral copies this assay can detect is 131 copies/mL. A negative result does not preclude SARS-Cov-2 infection and should not be used as the sole basis for treatment or other patient management decisions. A negative result may occur with  improper specimen collection/handling, submission of specimen other than nasopharyngeal swab, presence of viral mutation(s) within the areas targeted by this assay, and inadequate number of viral copies (<131 copies/mL). A negative result must be combined with clinical observations, patient history, and epidemiological information. The expected result is Negative. Fact Sheet for Patients:  PinkCheek.be Fact Sheet for Healthcare Providers:  GravelBags.it This test is not yet ap proved or cleared by the [place] FDA and  has been authorized for detection and/or diagnosis of SARS-CoV-2 by FDA under an Emergency Use Authorization (EUA). This EUA will remain  in effect (meaning this test can be used) for the duration of the COVID-19 declaration under Section 564(b)(1) of the Act, 21 U.S.C. section 360bbb-3(b)(1), unless the authorization is terminated or revoked sooner.    Influenza A by PCR NEGATIVE NEGATIVE Final   Influenza B by PCR NEGATIVE NEGATIVE Final    Comment: (NOTE) The Xpert Xpress SARS-CoV-2/FLU/RSV assay is intended as an aid in  the diagnosis of influenza from Nasopharyngeal swab specimens and  should not be used as  a sole basis for treatment. Nasal washings and  aspirates are unacceptable for Xpert Xpress SARS-CoV-2/FLU/RSV  testing. Fact Sheet for Patients: PinkCheek.be Fact Sheet for Healthcare Providers: GravelBags.it This test is not yet approved or cleared by the [place] FDA and  has been authorized for detection and/or diagnosis of SARS-CoV-2 by  FDA under an Emergency Use Authorization (EUA). This EUA will remain  in effect (meaning this  test can be used) for the duration of the  Covid-19 declaration under Section 564(b)(1) of the Act, 21  U.S.C. section 360bbb-3(b)(1), unless the authorization is  terminated or revoked. Performed at [facility], [address], [place], [place] [address]   MRSA PCR Screening     Status: None   Collection Time: 03/08/19 10:26 PM   Specimen: Nasopharyngeal  Result Value Ref Range Status   MRSA by PCR NEGATIVE NEGATIVE Final    Comment:        The GeneXpert MRSA Assay (FDA approved for NASAL specimens only), is one component of a comprehensive MRSA colonization surveillance program. It is not intended to diagnose MRSA infection nor to guide or monitor treatment for MRSA infections. Performed at [facility], [place] [address], [place], [place] [address]    *Note: Due to a large number of results and/or encounters for the requested time period, some results have not been displayed. A complete set of results can be found in Results Review.    Coagulation Studies: Recent Labs    03/22/19 0500 03/23/19 0350 03/24/19 0230  LABPROT 19.2* 18.9* 19.6*  INR 1.6* 1.6* 1.7*    Urinalysis: No results for input(s): COLORURINE, LABSPEC, PHURINE, GLUCOSEU, HGBUR, BILIRUBINUR, KETONESUR, PROTEINUR, UROBILINOGEN, NITRITE, LEUKOCYTESUR in the last 72 hours.  Invalid input(s): APPERANCEUR    Imaging: CT HEAD WO CONTRAST  Result Date: 03/24/2019 CLINICAL DATA:  Fall on heparin. EXAM: CT  HEAD WITHOUT CONTRAST TECHNIQUE: Contiguous axial images were obtained from the base of the skull through the vertex without intravenous contrast. COMPARISON:  03/13/2012 FINDINGS: Brain: No evidence of acute infarction, hemorrhage, hydrocephalus, extra-axial collection or mass lesion/mass effect. Vascular: No hyperdense vessel or unexpected calcification. Skull: Normal. Negative for fracture or focal lesion. Sinuses/Orbits: No acute finding. IMPRESSION: Negative head CT. Electronically Signed   By: Monte Fantasia M.D.   On: 03/24/2019 04:58     Medications:   . sodium chloride 10 mL/hr at 03/14/19 1615  . heparin 1,350 Units/hr (03/24/19 0000)   . allopurinol  150 mg Oral q morning - 10a  . Chlorhexidine Gluconate Cloth  6 each Topical Q0600  . gabapentin  300 mg Oral BID  . insulin aspart  0-15 Units Subcutaneous TID WC  . insulin aspart  0-5 Units Subcutaneous QHS  . insulin glargine  5 Units Subcutaneous Daily  . lidocaine (PF)  5 mL Other Once  . lidocaine  10 mL Intradermal Once  . midodrine  10 mg Oral TID WC  . polyethylene glycol  17 g Oral BID  . senna  1 tablet Oral Daily  . sodium chloride flush  10-40 mL Intracatheter Q12H  . sodium chloride flush  3 mL Intravenous Q12H  . vitamin B-12  100 mcg Oral Daily   sodium chloride, acetaminophen **OR** acetaminophen, fentaNYL (SUBLIMAZE) injection, heparin, heparin, lidocaine, ondansetron **OR** ondansetron (ZOFRAN) IV, sodium chloride, sodium chloride flush, sodium chloride flush  Assessment/ Plan:   Acute kidney injury on chronic kidney disease stage IV with a baseline creatinine about 3 mg/dL.  On admission 5 mg/dL thought to be secondary to cardiorenal syndrome.  Edema has significantly improved.  CRRT 03/18/2019-03/23/2019.  I am not sure that she would be able to tolerate intermittent hemodialysis due to her systolic hypotension.  This is going to be a limiting factor.  Will need ongoing discussion with palliative care.  We will  also discussed with Dr. Aundra Dubin later today.  Hypertension/volume continue volume removal.  CRRT since 03/18/2019-03/23/2019  t.   she continues on midodrine 10 mg 3 times daily for blood pressure support.  Metabolic acidosis appears to be worsening.  Hyperkalemia will place on Lokelma 10 g 3 times daily.  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  Does not appear to hopeful.  Will need to meet with team to discuss plan  Addendum.  Discussed with Dr. Aundra Dubin we will proceed with dialysis intermittent today 03/24/2019 if patient does not tolerate then we will transition to palliative care.  LOS: [place] _0 _1 :05 AM

## 2019-03-25 ENCOUNTER — Ambulatory Visit (HOSPITAL_COMMUNITY): Payer: Medicaid Other | Admitting: Hematology

## 2019-03-25 LAB — RENAL FUNCTION PANEL
Albumin: 2.1 g/dL — ABNORMAL LOW (ref 3.5–5.0)
Albumin: 2.2 g/dL — ABNORMAL LOW (ref 3.5–5.0)
Anion gap: 18 — ABNORMAL HIGH (ref 5–15)
Anion gap: 13 (ref 5–15)
BUN: 36 mg/dL — ABNORMAL HIGH (ref 6–20)
BUN: 42 mg/dL — ABNORMAL HIGH (ref 6–20)
CO2: 24 mmol/L (ref 22–32)
CO2: 24 mmol/L (ref 22–32)
Calcium: 8.7 mg/dL — ABNORMAL LOW (ref 8.9–10.3)
Calcium: 8.4 mg/dL — ABNORMAL LOW (ref 8.9–10.3)
Chloride: 90 mmol/L — ABNORMAL LOW (ref 98–111)
Chloride: 95 mmol/L — ABNORMAL LOW (ref 98–111)
Creatinine, Ser: 3.17 mg/dL — ABNORMAL HIGH (ref 0.44–1.00)
Creatinine, Ser: 3.65 mg/dL — ABNORMAL HIGH (ref 0.44–1.00)
GFR calc Af Amer: 18 mL/min — ABNORMAL LOW (ref >60)
GFR calc Af Amer: 15 mL/min — ABNORMAL LOW (ref >60)
GFR calc non Af Amer: 15 mL/min — ABNORMAL LOW (ref >60)
GFR calc non Af Amer: 13 mL/min — ABNORMAL LOW (ref >60)
Glucose, Bld: 352 mg/dL — ABNORMAL HIGH (ref 70–99)
Glucose, Bld: 273 mg/dL — ABNORMAL HIGH (ref 70–99)
Phosphorus: 4.7 mg/dL — ABNORMAL HIGH (ref 2.5–4.6)
Phosphorus: 4.7 mg/dL — ABNORMAL HIGH (ref 2.5–4.6)
Potassium: 4 mmol/L (ref 3.5–5.1)
Potassium: 4.2 mmol/L (ref 3.5–5.1)
Sodium: 132 mmol/L — ABNORMAL LOW (ref 135–145)
Sodium: 132 mmol/L — ABNORMAL LOW (ref 135–145)

## 2019-03-25 LAB — GLUCOSE, CAPILLARY
Glucose-Capillary: 341 mg/dL — ABNORMAL HIGH (ref 70–99)
Glucose-Capillary: 247 mg/dL — ABNORMAL HIGH (ref 70–99)
Glucose-Capillary: 279 mg/dL — ABNORMAL HIGH (ref 70–99)
Glucose-Capillary: 245 mg/dL — ABNORMAL HIGH (ref 70–99)

## 2019-03-25 LAB — CBC
HCT: 28.7 % — ABNORMAL LOW (ref 36.0–46.0)
HCT: 29.6 % — ABNORMAL LOW (ref 36.0–46.0)
Hemoglobin: 9.6 g/dL — ABNORMAL LOW (ref 12.0–15.0)
Hemoglobin: 10.1 g/dL — ABNORMAL LOW (ref 12.0–15.0)
MCH: 27.7 pg (ref 26.0–34.0)
MCH: 27.8 pg (ref 26.0–34.0)
MCHC: 33.4 g/dL (ref 30.0–36.0)
MCHC: 34.1 g/dL (ref 30.0–36.0)
MCV: 82.7 fL (ref 80.0–100.0)
MCV: 81.5 fL (ref 80.0–100.0)
Platelets: 149 10*3/uL — ABNORMAL LOW (ref 150–400)
Platelets: 165 10*3/uL (ref 150–400)
RBC: 3.47 MIL/uL — ABNORMAL LOW (ref 3.87–5.11)
RBC: 3.63 MIL/uL — ABNORMAL LOW (ref 3.87–5.11)
RDW: 20.6 % — ABNORMAL HIGH (ref 11.5–15.5)
RDW: 20.4 % — ABNORMAL HIGH (ref 11.5–15.5)
WBC: 12.5 10*3/uL — ABNORMAL HIGH (ref 4.0–10.5)
WBC: 11.9 10*3/uL — ABNORMAL HIGH (ref 4.0–10.5)
nRBC: 0.6 % — ABNORMAL HIGH (ref 0.0–0.2)
nRBC: 0.6 % — ABNORMAL HIGH (ref 0.0–0.2)

## 2019-03-25 LAB — PROTIME-INR
INR: 1.6 — ABNORMAL HIGH (ref 0.8–1.2)
Prothrombin Time: 19.2 seconds — ABNORMAL HIGH (ref 11.4–15.2)

## 2019-03-25 LAB — HEPARIN LEVEL (UNFRACTIONATED): Heparin Unfractionated: 0.34 IU/mL (ref 0.30–0.70)

## 2019-03-25 MED ORDER — SODIUM CHLORIDE 0.9 % IV SOLN: 100.0000 mL | INTRAVENOUS | Status: DC | PRN

## 2019-03-25 MED ORDER — HEPARIN SODIUM (PORCINE) 1000 UNIT/ML DIALYSIS: 1000.0000 [IU] | INTRAMUSCULAR | Status: DC | PRN

## 2019-03-25 MED ORDER — CHLORHEXIDINE GLUCONATE CLOTH 2 % EX PADS
6.0000 | MEDICATED_PAD | Freq: Every day | CUTANEOUS | Status: DC
  Administered 2019-03-28: 6 via TOPICAL

## 2019-03-25 MED ORDER — ALTEPLASE 2 MG IJ SOLR
2.0000 mg | Freq: Once | INTRAMUSCULAR | Status: DC | PRN
Start: 2019-03-25 — End: 2019-03-25

## 2019-03-25 MED ORDER — LIDOCAINE-PRILOCAINE 2.5-2.5 % EX CREA: 1.0000 "application " | TOPICAL_CREAM | CUTANEOUS | Status: DC | PRN

## 2019-03-25 MED ORDER — PENTAFLUOROPROP-TETRAFLUOROETH EX AERO: 1.0000 "application " | INHALATION_SPRAY | CUTANEOUS | Status: DC | PRN

## 2019-03-25 MED ORDER — LIDOCAINE-PRILOCAINE 2.5-2.5 % EX CREA
1.0000 | TOPICAL_CREAM | CUTANEOUS | Status: DC | PRN
Start: 2019-03-25 — End: 2019-03-25

## 2019-03-25 MED ORDER — LIDOCAINE HCL (PF) 1 % IJ SOLN
5.0000 mL | INTRAMUSCULAR | Status: DC | PRN
Start: 2019-03-25 — End: 2019-03-25

## 2019-03-25 MED ORDER — ALTEPLASE 2 MG IJ SOLR: 2.0000 mg | Freq: Once | INTRAMUSCULAR | Status: DC | PRN

## 2019-03-25 NOTE — Progress Notes (Signed)
[facility] ROUNDING NOTE   Subjective:   Is a 59 year old lady that was admitted 03/08/2019 with a history of chronic atrial fibrillation complete heart block status post pacemaker placement chronic systolic congestive heart failure with ejection fraction of 15 to 20% liver cirrhosis admitted with increasing swelling of legs and abdomen and a 30 pound weight gain.  She was found to have acute on chronic biventricular heart failure.  She was treated with CRRT 03/18/2019-03/23/2019.  Marland Kitchen  There is concern for cardiac amyloid this is being evaluated by cardiology bone marrow 03/11/2018 showed 14% plasma cells consistent with multiple myeloma  but LV morphology did not appear consistent with AL amyloid.  She is no longer on warfarin due to concern for warfarin skin necrosis.  Continues on IV heparin.  Underwent successful dialysis with 478 cc ultrafiltered 03/24/2019.  We will plan next dialysis treatment 03/26/2019  Blood pressure 99/57 pulse 59 temperature 98.3 O2 sats 100% room air  Sodium 132 potassium 4 chloride 90 CO2 24 BUN 36 creatinine 3.7 glucose 352 calcium 8.7 phosphorus 4.7 albumin 2.1 WBC 12.5 hemoglobin is 9.6 platelets 149  No urine output  Allopurinol 150 mg daily, Neurontin 300 mg twice daily insulin sliding scale, glargine 5 units, midodrine 15 mg 3 times daily.   Objective:  Vital signs in last 24 hours:  Temp:  [97.8 F (36.6 C)-98.6 F (37 C)] 98.3 F (36.8 C) (01/28 0300) Pulse Rate:  [56-66] 59 (01/28 0700) Resp:  [14-36] 24 (01/28 0700) BP: (91-134)/(46-81) 99/57 (01/28 0700) SpO2:  [84 %-100 %] 95 % (01/28 0700)  Weight change:  Filed Weights   03/21/19 0500 03/22/19 0500 03/23/19 0356  Weight: 70.8 kg 68.3 kg 67.9 kg    Intake/Output: I/O last 3 completed shifts: In: 782 [P.O.:360; I.V.:444] Out: 478 [Other:478]   Intake/Output this shift:  No intake/output data recorded. Alert nondistressed CVS- RRR JVP to jaw right IJ tunneled dialysis  catheter 2/6 systolic murmur RS-clear to auscultation ABD- BS present soft non-distended EXT-trace edema   Basic Metabolic Panel: Recent Labs  Lab 03/19/19 0513 03/19/19 1655 03/20/19 0440 03/20/19 1600 03/21/19 0452 03/21/19 1603 03/22/19 0500 03/22/19 0500 03/22/19 1700 03/22/19 1700 03/23/19 0405 03/24/19 0240 03/25/19 0439  NA 134*   < > 133*   < > 130*   < > 129*  --  126*  --  127* 125* 132*  K 4.4   < > 4.6   < > 4.9   < > 5.0  --  4.8  --  5.0 5.8* 4.0  CL 96*   < > 98   < > 94*   < > 93*  --  92*  --  94* 92* 90*  CO2 23   < > 22   < > 21*   < > 20*  --  22  --  21* 19* 24  GLUCOSE 180*   < > 176*   < > 174*   < > 194*  --  257*  --  218* 263* 352*  BUN 26*   < > 25*   < > 25*   < > 25*  --  35*  --  29* 60* 36*  CREATININE 2.13*   < > 1.89*   < > 1.70*   < > 1.85*  --  2.23*  --  1.94* 4.08* 3.17*  CALCIUM 8.8*   < > 8.8*   < > 9.1   < > 9.1   < > 8.7*   < >  8.9 9.1 8.7*  MG 2.3  --  2.5*  --  2.6*  --  2.9*  --   --   --  2.8*  --   --   PHOS 3.4   < > 2.7   < > 2.7   < > 2.9  --  3.1  --  2.9 4.3 4.7*   < > = values in this interval not displayed.    Liver Function Tests: Recent Labs  Lab 03/22/19 0500 03/22/19 1700 03/23/19 0405 03/24/19 0240 03/25/19 0439  ALBUMIN 2.9* 2.7* 2.8* 2.6* 2.1*   No results for input(s): LIPASE, AMYLASE in the last 168 hours. No results for input(s): AMMONIA in the last 168 hours.  CBC: Recent Labs  Lab 03/21/19 0452 03/22/19 0500 03/23/19 0350 03/24/19 0230 03/25/19 0439  WBC 15.9* 16.1* 15.5* 14.1* 12.5*  HGB 10.7* 11.5* 11.6* 11.0* 9.6*  HCT 32.0* 34.8* 34.8* 32.0* 28.7*  MCV 82.3 84.5 83.5 81.8 82.7  PLT 251 248 227 199 149*    Cardiac Enzymes: No results for input(s): CKTOTAL, CKMB, CKMBINDEX, TROPONINI in the last 168 hours.  BNP: Invalid input(s): POCBNP  CBG: Recent Labs  Lab 03/24/19 0630 03/24/19 1133 03/24/19 1640 03/24/19 2143 03/25/19 0655  GLUCAP 265* 278* 158* 308* 341*     Microbiology: Results for orders placed or performed during the hospital encounter of 03/07/19  Respiratory Panel by RT PCR (Flu A&B, Covid) - Nasopharyngeal Swab     Status: None   Collection Time: 03/07/19  9:21 PM   Specimen: Nasopharyngeal Swab  Result Value Ref Range Status   SARS Coronavirus 2 by RT PCR NEGATIVE NEGATIVE Final    Comment: (NOTE) SARS-CoV-2 target nucleic acids are NOT DETECTED. The SARS-CoV-2 RNA is generally detectable in upper respiratoy specimens during the acute phase of infection. The lowest concentration of SARS-CoV-2 viral copies this assay can detect is 131 copies/mL. A negative result does not preclude SARS-Cov-2 infection and should not be used as the sole basis for treatment or other patient management decisions. A negative result may occur with  improper specimen collection/handling, submission of specimen other than nasopharyngeal swab, presence of viral mutation(s) within the areas targeted by this assay, and inadequate number of viral copies (<131 copies/mL). A negative result must be combined with clinical observations, patient history, and epidemiological information. The expected result is Negative. Fact Sheet for Patients:  PinkCheek.be Fact Sheet for Healthcare Providers:  GravelBags.it This test is not yet ap proved or cleared by the [place] FDA and  has been authorized for detection and/or diagnosis of SARS-CoV-2 by FDA under an Emergency Use Authorization (EUA). This EUA will remain  in effect (meaning this test can be used) for the duration of the COVID-19 declaration under Section 564(b)(1) of the Act, 21 U.S.C. section 360bbb-3(b)(1), unless the authorization is terminated or revoked sooner.    Influenza A by PCR NEGATIVE NEGATIVE Final   Influenza B by PCR NEGATIVE NEGATIVE Final    Comment: (NOTE) The Xpert Xpress SARS-CoV-2/FLU/RSV assay is intended as an aid in   the diagnosis of influenza from Nasopharyngeal swab specimens and  should not be used as a sole basis for treatment. Nasal washings and  aspirates are unacceptable for Xpert Xpress SARS-CoV-2/FLU/RSV  testing. Fact Sheet for Patients: PinkCheek.be Fact Sheet for Healthcare Providers: GravelBags.it This test is not yet approved or cleared by the [place] FDA and  has been authorized for detection and/or diagnosis of SARS-CoV-2 by  FDA under an  Emergency Use Authorization (EUA). This EUA will remain  in effect (meaning this test can be used) for the duration of the  Covid-19 declaration under Section 564(b)(1) of the Act, 21  U.S.C. section 360bbb-3(b)(1), unless the authorization is  terminated or revoked. Performed at [facility], [address] [place], [place] [address]   MRSA PCR Screening     Status: None   Collection Time: 03/08/19 10:26 PM   Specimen: Nasopharyngeal  Result Value Ref Range Status   MRSA by PCR NEGATIVE NEGATIVE Final    Comment:        The GeneXpert MRSA Assay (FDA approved for NASAL specimens only), is one component of a comprehensive MRSA colonization surveillance program. It is not intended to diagnose MRSA infection nor to guide or monitor treatment for MRSA infections. Performed at [facility], [place] [address], [place], [place] [address]    *Note: Due to a large number of results and/or encounters for the requested time period, some results have not been displayed. A complete set of results can be found in Results Review.    Coagulation Studies: Recent Labs    03/23/19 0350 03/24/19 0230 03/25/19 0439  LABPROT 18.9* 19.6* 19.2*  INR 1.6* 1.7* 1.6*    Urinalysis: No results for input(s): COLORURINE, LABSPEC, PHURINE, GLUCOSEU, HGBUR, BILIRUBINUR, KETONESUR, PROTEINUR, UROBILINOGEN, NITRITE, LEUKOCYTESUR in the last 72 hours.  Invalid input(s): APPERANCEUR     Imaging: CT HEAD WO CONTRAST  Result Date: 03/24/2019 CLINICAL DATA:  Fall on heparin. EXAM: CT HEAD WITHOUT CONTRAST TECHNIQUE: Contiguous axial images were obtained from the base of the skull through the vertex without intravenous contrast. COMPARISON:  03/13/2012 FINDINGS: Brain: No evidence of acute infarction, hemorrhage, hydrocephalus, extra-axial collection or mass lesion/mass effect. Vascular: No hyperdense vessel or unexpected calcification. Skull: Normal. Negative for fracture or focal lesion. Sinuses/Orbits: No acute finding. IMPRESSION: Negative head CT. Electronically Signed   By: Monte Fantasia M.D.   On: 03/24/2019 04:58     Medications:   . sodium chloride 10 mL/hr at 03/14/19 1615  . sodium chloride    . sodium chloride    . heparin 1,350 Units/hr (03/25/19 0700)   . allopurinol  150 mg Oral q morning - 10a  . Chlorhexidine Gluconate Cloth  6 each Topical Q0600  . Chlorhexidine Gluconate Cloth  6 each Topical Q0600  . feeding supplement (NEPRO CARB STEADY)  237 mL Oral TID BM  . gabapentin  300 mg Oral BID  . insulin aspart  0-15 Units Subcutaneous TID WC  . insulin aspart  0-5 Units Subcutaneous QHS  . insulin glargine  5 Units Subcutaneous Daily  . lidocaine (PF)  5 mL Other Once  . lidocaine  10 mL Intradermal Once  . midodrine  15 mg Oral TID WC  . multivitamin  1 tablet Oral QHS  . polyethylene glycol  17 g Oral BID  . senna  1 tablet Oral Daily  . sodium chloride flush  10-40 mL Intracatheter Q12H  . sodium chloride flush  3 mL Intravenous Q12H  . vitamin B-12  100 mcg Oral Daily   sodium chloride, sodium chloride, sodium chloride, acetaminophen **OR** acetaminophen, alteplase, heparin, heparin, heparin, lidocaine (PF), lidocaine, lidocaine-prilocaine, ondansetron **OR** ondansetron (ZOFRAN) IV, pentafluoroprop-tetrafluoroeth, sodium chloride, sodium chloride flush, sodium chloride flush, traMADol  Assessment/ Plan:   Acute kidney injury on chronic  kidney disease stage IV with a baseline creatinine about 3 mg/dL.  On admission 5 mg/dL thought to be secondary to cardiorenal  syndrome.  Edema has significantly improved.  CRRT 03/18/2019-03/23/2019.  Apparently tolerated dialysis 03/24/2019 Next dialysis treatment 03/26/2019.  It appears that hypotension may be limiting ability to effectively ultrafilter fluid.  We will continue to follow  Hypertension/volume continue volume removal.  CRRT since 03/18/2019-03/23/2019 t.  Increase midodrine to 15 mg 3 times a day in order to support blood pressure for dialysis.  Metabolic acidosis resolved  Hyperkalemia resolved with dialysis  Warfarin skin necrosis biopsy negative for calciphylaxis would continue off warfarin as form of anticoagulation  Chronic atrial fibrillation IV heparin  Diabetes mellitus as per primary service  Multiple myeloma status post bone marrow biopsy consistent with this diagnosis  Palliative medicine consulted and appreciate help from Joseph Pierini.    LOS: [place] _0 _1 :55 AM

## 2019-03-25 NOTE — Progress Notes (Signed)
Inpatient Diabetes Program Recommendations  AACE/ADA: New Consensus Statement on Inpatient Glycemic Control   Target Ranges:  Prepandial:   less than 140 mg/dL      Peak postprandial:   less than 180 mg/dL (1-2 hours)      Critically ill patients:  140 - 180 mg/dL  Results for MYLIA, PONDEXTER (MRN [id number]) as of 03/25/2019 07:42  Ref. Range 03/24/2019 06:30 03/24/2019 11:33 03/24/2019 16:40 03/24/2019 21:43 03/25/2019 06:55  Glucose-Capillary Latest Ref Range: 70 - 99 mg/dL 265 (H) 278 (H) 158 (H) 308 (H) 341 (H)    Review of Glycemic Control  Outpatient Diabetes medications: NPH 32 units QAM, Novolog 02/08/15 TID with meals, Victoza 1.2 mg daily Current orders for Inpatient glycemic control: Lantus 5 units daily, Novolog 0-15 units TID with meals, Novolog 0-5 units QHS  Inpatient Diabetes Program Recommendations:    Insulin-Basal: Please consider increasing to Lantus 7 units BID starting now.  Insulin-Meal Coverage: Please consider ordering Novolog 3 units TID with meals for meal coverage if patient eats at least 50% of meals.  Thanks, Barnie Alderman, RN, MSN, CDE Diabetes Coordinator Inpatient Diabetes Program [phone] (Team Pager from 8am to 5pm)

## 2019-03-25 NOTE — Progress Notes (Signed)
Occupational Therapy Treatment Patient Details Name: [name] MRN: [id number] DOB: 06-25-1960 Today's Date: 03/25/2019    History of present illness 59 year old with PMH significant for MGUS, thrombocytopenia, A. fib on Coumadin, CHB with pacemaker, nonischemic cardiomyopathy, CKD stage IV baseline creatinine 3.0, cirrhosis who presented with several weeks of progressive swelling about 30 pound weight gain. critically ill due to acute respiratory failure requiring BiPAP due to acute pulmonary edema due to acute decompensated HFrEF in context of stage V ESRD; new diagnosis of Multiple Myeloma.   OT comments  Pt initially lethargic. Assisted to sit EOB with pt becoming more alert and demonstrated ability to self feed with increased spillage and min assist due to myoclonus. Pt transferred to [facility] for BM with mod assist. Total assist for all dressing and for pericare. Supportive husband participated in session.   Follow Up Recommendations  CIR;Supervision/Assistance - 24 hour    Equipment Recommendations  3 in 1 bedside commode    Recommendations for Other Services      Precautions / Restrictions Precautions Precautions: Fall Precaution Comments: myoclonus, knees buckle       Mobility Bed Mobility Overal bed mobility: Needs Assistance Bed Mobility: Supine to Sit;Sit to Supine     Supine to sit: Max assist Sit to supine: Max assist   General bed mobility comments: assist for all aspects, cues to use bed rail  Transfers Overall transfer level: Needs assistance Equipment used: 1 person hand held assist Transfers: Sit to/from [facility] Sit to Stand: Mod assist Stand pivot transfers: Mod assist       General transfer comment: assist to rise and steady    Balance Overall balance assessment: Needs assistance   Sitting balance-Leahy Scale: Fair Sitting balance - Comments: initially needing max assist, progressed to close guar   Standing balance support:  During functional activity Standing balance-Leahy Scale: Poor                             ADL either performed or assessed with clinical judgement   ADL Overall ADL's : Needs assistance/impaired Eating/Feeding: Moderate assistance;Sitting Eating/Feeding Details (indicate cue type and reason): assist to stabilize bowl while self feeding, increased spillage             Upper Body Dressing : Total assistance;Sitting   Lower Body Dressing: Bed level;Total assistance   Toilet Transfer: Moderate assistance;Stand-pivot;BSC   Toileting- Clothing Manipulation and Hygiene: Total assistance;Sit to/from stand               Vision       Perception     Praxis      Cognition Arousal/Alertness: Awake/alert;Lethargic(awake at EOB, but once she returned to supine became letharg) Behavior During Therapy: WFL for tasks assessed/performed Overall Cognitive Status: Impaired/Different from baseline Area of Impairment: Attention;Safety/judgement;Following commands                       Following Commands: Follows one step commands with increased time Safety/Judgement: Decreased awareness of safety;Decreased awareness of deficits     General Comments: cues to not stuff her mouth        Exercises     Shoulder Instructions       General Comments      Pertinent Vitals/ Pain       Pain Assessment: Faces Faces Pain Scale: Hurts little more Pain Location: BLEs Pain Descriptors / Indicators: Grimacing;Guarding Pain Intervention(s): Monitored during session;Repositioned  Home Living                                          Prior Functioning/Environment              Frequency  Min 2X/week        Progress Toward Goals  OT Goals(current goals can now be found in the care plan section)  Progress towards OT goals: Progressing toward goals  Acute Rehab OT Goals Patient Stated Goal: get better, play with grandkids OT Goal  Formulation: With patient/family Time For Goal Achievement: 03/31/19 Potential to Achieve Goals: Good  Plan Discharge plan remains appropriate    Co-evaluation                 AM-PAC OT "6 Clicks" Daily Activity     Outcome Measure   Help from another person eating meals?: A Little Help from another person taking care of personal grooming?: A Little Help from another person toileting, which includes using toliet, bedpan, or urinal?: Total Help from another person bathing (including washing, rinsing, drying)?: A Lot Help from another person to put on and taking off regular upper body clothing?: Total Help from another person to put on and taking off regular lower body clothing?: Total 6 Click Score: 11    End of Session Equipment Utilized During Treatment: Gait belt  OT Visit Diagnosis: Unsteadiness on feet (R26.81);Other abnormalities of gait and mobility (R26.89);Muscle weakness (generalized) (M62.81);Other symptoms and signs involving cognitive function   Activity Tolerance Patient tolerated treatment well   Patient Left in bed;with call bell/phone within reach;with bed alarm set;with family/visitor present   Nurse Communication Mobility status        Time: 6431-4276 OT Time Calculation (min): 44 min  Charges: OT General Charges $OT Visit: 1 Visit OT Treatments $Self Care/Home Management : 38-52 mins  Nestor Lewandowsky, OTR/L Acute Rehabilitation Services Pager: [phone] Office: [phone]   Malka So 03/25/2019, 1:26 PM

## 2019-03-25 NOTE — Plan of Care (Signed)
[name] has been more alert this shift. Still able to follow commands and has interacted back and forth with the RN this shift, even as far as giving a thumbs up when first seeing the RN. BP has been wnl. No UO noted. BM this shift with assistance to the bedside commode. The RN educated [name] and her husband on the importance of the current fluid restriction in place and the equivalent of the measurements of her consumption to the amount of fluid pulled off from dialysis. No acute events this shift. Report given and day shift RN updated on POC.   Problem: Activity: Goal: Capacity to carry out activities will improve Outcome: Progressing   Problem: Cardiac: Goal: Ability to achieve and maintain adequate cardiopulmonary perfusion will improve Outcome: Progressing   Problem: Nutrition: Goal: Adequate nutrition will be maintained Outcome: Progressing   Problem: Elimination: Goal: Will not experience complications related to bowel motility Outcome: Progressing   Problem: Safety: Goal: Ability to remain free from injury will improve Outcome: Progressing

## 2019-03-25 NOTE — Progress Notes (Signed)
Patient ID: [name], female   DOB: 09-24-1960, 59 y.o.   MRN: [id number]     Advanced Heart Failure Rounding Note  PCP-Cardiologist: Kate Sable, MD   Subjective:    Patient had HD yesterday, 500 cc off.  SBP 100s this morning on midodrine 15 tid. CVP 8 today.   Still somewhat lethargic, has not had narcotics.  Weak.   New diagnosis of multiple myeloma. Bx for possible calciphylaxis was negative (? false negative).   Echo: EF < 20%, no LVH, mildly reduced RV systolic function, severe biatrial enlargement, severe TR.  RHC Procedural Findings (dobutamine 2.5 mcg/kg/min): Hemodynamics (mmHg) RA 18 RV 43/18 PA 45/19, mean 28 PCWP mean 19 Oxygen saturations: PA 63% AO 94% Cardiac Output (Fick) 6.1  Cardiac Index (Fick) 3.06 PVR 1.5 WU PAPI 1.4  CT chest w/o contrast: Atelectasis, no significant pulmonary disease.    Objective:   Weight Range: 67.9 kg Body mass index is 24.16 kg/m.   Vital Signs:   Temp:  [97.8 F (36.6 C)-98.6 F (37 C)] 98.3 F (36.8 C) (01/28 0300) Pulse Rate:  [56-66] 59 (01/28 0700) Resp:  [14-36] 24 (01/28 0700) BP: (91-134)/(46-81) 99/57 (01/28 0700) SpO2:  [84 %-100 %] 95 % (01/28 0700) Last BM Date: 03/24/19  Weight change: Filed Weights   03/21/19 0500 03/22/19 0500 03/23/19 0356  Weight: 70.8 kg 68.3 kg 67.9 kg    Intake/Output:   Intake/Output Summary (Last 24 hours) at 03/25/2019 0750 Last data filed at 03/25/2019 0700 Gross per 24 hour  Intake 659.9 ml  Output 478 ml  Net 181.9 ml      Physical Exam   General: NAD Neck: No JVD, no thyromegaly or thyroid nodule.  Lungs: Clear to auscultation bilaterally with normal respiratory effort. CV: Lateral PMI.  Heart regular S1/S2, no S3/S4, no murmur.  No peripheral edema.   Abdomen: Soft, nontender, no hepatosplenomegaly, no distention.  Skin: Intact without lesions or rashes.  Neurologic: Alert and oriented x 3.  Psych: Normal affect. Extremities: No clubbing or  cyanosis.  HEENT: Normal.    Telemetry   A fib BiV pacing 60 Personally reviewed    Labs    CBC Recent Labs    03/24/19 0230 03/25/19 0439  WBC 14.1* 12.5*  HGB 11.0* 9.6*  HCT 32.0* 28.7*  MCV 81.8 82.7  PLT 199 798*   Basic Metabolic Panel Recent Labs    03/23/19 0405 03/23/19 0405 03/24/19 0240 03/25/19 0439  NA 127*   < > 125* 132*  K 5.0   < > 5.8* 4.0  CL 94*   < > 92* 90*  CO2 21*   < > 19* 24  GLUCOSE 218*   < > 263* 352*  BUN 29*   < > 60* 36*  CREATININE 1.94*   < > 4.08* 3.17*  CALCIUM 8.9   < > 9.1 8.7*  MG 2.8*  --   --   --   PHOS 2.9   < > 4.3 4.7*   < > = values in this interval not displayed.   Liver Function Tests Recent Labs    03/24/19 0240 03/25/19 0439  ALBUMIN 2.6* 2.1*   No results for input(s): LIPASE, AMYLASE in the last 72 hours. Cardiac Enzymes No results for input(s): CKTOTAL, CKMB, CKMBINDEX, TROPONINI in the last 72 hours.  BNP: BNP (last 3 results) Recent Labs    03/07/19 2101  BNP 1,072.0*    ProBNP (last 3 results) No results for input(s):  PROBNP in the last 8760 hours.   D-Dimer No results for input(s): DDIMER in the last 72 hours. Hemoglobin A1C No results for input(s): HGBA1C in the last 72 hours. Fasting Lipid Panel No results for input(s): CHOL, HDL, LDLCALC, TRIG, CHOLHDL, LDLDIRECT in the last 72 hours. Thyroid Function Tests No results for input(s): TSH, T4TOTAL, T3FREE, THYROIDAB in the last 72 hours.  Invalid input(s): FREET3  Other results:   Imaging    No results found.   Medications:     Scheduled Medications: . allopurinol  150 mg Oral q morning - 10a  . Chlorhexidine Gluconate Cloth  6 each Topical Q0600  . Chlorhexidine Gluconate Cloth  6 each Topical Q0600  . feeding supplement (NEPRO CARB STEADY)  237 mL Oral TID BM  . gabapentin  300 mg Oral BID  . insulin aspart  0-15 Units Subcutaneous TID WC  . insulin aspart  0-5 Units Subcutaneous QHS  . insulin glargine  5 Units  Subcutaneous Daily  . lidocaine (PF)  5 mL Other Once  . lidocaine  10 mL Intradermal Once  . midodrine  15 mg Oral TID WC  . multivitamin  1 tablet Oral QHS  . polyethylene glycol  17 g Oral BID  . senna  1 tablet Oral Daily  . sodium chloride flush  10-40 mL Intracatheter Q12H  . sodium chloride flush  3 mL Intravenous Q12H  . vitamin B-12  100 mcg Oral Daily    Infusions: . sodium chloride 10 mL/hr at 03/14/19 1615  . sodium chloride    . sodium chloride    . heparin 1,350 Units/hr (03/25/19 0700)    PRN Medications: sodium chloride, sodium chloride, sodium chloride, acetaminophen **OR** acetaminophen, alteplase, heparin, heparin, heparin, lidocaine (PF), lidocaine, lidocaine-prilocaine, ondansetron **OR** ondansetron (ZOFRAN) IV, pentafluoroprop-tetrafluoroeth, sodium chloride, sodium chloride flush, sodium chloride flush, traMADol    Assessment/Plan   1. Acute Hypoxic Respiratory Failure: Resolved, off oxygen. 2. 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  Last echo in 2/19 with EF 15-20%. She has a [employer] CRT-D device. Admitted with progressive dyspnea and 30 lb weight gain. Empirically started on dobutamine 2.5 due to concern for low output. RHC showed R>L heart failure with low PAPI adequate cardiac output on dobutamine. Now off dobutamine.  Initially started on iHD after she failed high dose diuretics, then CVVH with intractable volume overload.  Weight down 50 lbs from baseline, now off CVVH.  CVP 8 today. She will be HD dependent.   - Continue midodrine 15 mg tid with relative hypotension and need for iHD.  - Next HD tomorrow.   - Stopped digoxin and benazepril with AKI.  - Stopped beta blocker with concern for low output.  - No BP room for hydralazine/nitrates.  - Cardiac amyloidosis is a concern with myeloma, but echo myocardial appearance is not consistent. BM biopsy negative for amyloidosis.  - LE u/s negative for DVT 2. AKI on CKD  stage 4: Baseline creatinine around 3, went up to 5 with BUN 150 this admission. Suspect cardiorenal syndrome superimposed on possible diabetic nephropathy. She will be dialysis-dependent going forward. - Off CVVH, got iHD yesterday.   - Nephrology assistance appreciated.  3. Chronic atrial fibrillation: Warfarin held for procedures, now on heparin gtt. Long-term, will transition to apixaban with some concern for warfarin skin necrosis.    - heparin held after fall, head CT negative, restart heparin gtt.  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  5. DM: Per primary.  6. Monoclonal gammopathy:  LV morphology does not look consistent with AL amyloidosis. She has been seen by hematology, has had bone marrow bx showing 14% plasma cells by flow cytometry on the aspirate which is by definition multiple myeloma. Seen by heme/onc, plan for outpatient treatment.  BM bx negative for amyloidosis.  7. Tricuspid regurgitation: Severe by echo.  8.  Subcutaneous nodules: ?calciphylaxis versus less likely warfarin skin necrosis.  - Off warfarin, will use Eliquis in future.  - Bx negative for calciphylaxis. (? False negative)  - Renal managing for possible calciphylaxis.  9. Deconditioning: Needs aggressive PT and possibly to CIR eventually.  10. Poor prognosis.  Not sure she will tolerate iHD with relative hypotension.  Palliative care following, she is now a limited code.    She can go to stepdown, HD again tomorrow.   Length of Stay: 17  Loralie Champagne, MD  03/25/2019, 7:50 AM  Advanced Heart Failure Team Pager [phone] (M-F; 7a - 4p)  Please contact [facility] for night-coverage after hours (4p -7a ) and weekends on [email or web address]

## 2019-03-25 NOTE — Progress Notes (Signed)
Physical Therapy Treatment Patient Details Name: [name] MRN: [id number] DOB: 10/04/1960 Today's Date: 03/25/2019    History of Present Illness 58-year-old with PMH significant for MGUS, thrombocytopenia, A. fib on Coumadin, CHB with pacemaker, nonischemic cardiomyopathy, CKD stage IV baseline creatinine 3.0, cirrhosis who presented with several weeks of progressive swelling about 30 pound weight gain. critically ill due to acute respiratory failure requiring BiPAP due to acute pulmonary edema due to acute decompensated HFrEF in context of stage V ESRD; new diagnosis of Multiple Myeloma.    PT Comments    Pt was very lethargic and did not arouse with EOB sitting.  Required max A for balance at EOB. Continues to have myoclonus jerks and unsafe to attempt further activity.  Will f/u as able.  Pt to have HD tomorrow per notes.    Follow Up Recommendations  CIR;Supervision for mobility/OOB;Supervision/Assistance - 24 hour     Equipment Recommendations  Rolling walker with 5" wheels;3in1 (PT);Wheelchair (measurements PT);Wheelchair cushion (measurements PT)    Recommendations for Other Services       Precautions / Restrictions Precautions Precautions: Fall Precaution Comments: myoclonus, knees buckle    Mobility  Bed Mobility Overal bed mobility: Needs Assistance Bed Mobility: Supine to Sit;Sit to Supine     Supine to sit: Max assist Sit to supine: Mod assist;+2 for safety/equipment   General bed mobility comments: Pt worked with OT earlier and became more alert at EOB; pt transferred to EOB and sat for 5 minutes but could not get alert enough to proceed; returned to supine  Transfers Overall transfer level: Needs assistance Equipment used: 1 person hand held assist Transfers: Sit to/from Stand;Stand Pivot Transfers Sit to Stand: Mod assist Stand pivot transfers: Mod assist       General transfer comment: deferred due to lethargy ; unable to follow commands or  sit  Ambulation/Gait                 Stairs             Wheelchair Mobility    Modified Rankin (Stroke Patients Only)       Balance Overall balance assessment: Needs assistance Sitting-balance support: Feet supported;No upper extremity supported Sitting balance-Leahy Scale: Zero Sitting balance - Comments: needing mod-max A; posterior lean; limited by lethergy and myoclonus; EOB 5 mins   Standing balance support: During functional activity Standing balance-Leahy Scale: Poor                              Cognition Arousal/Alertness: Lethargic Behavior During Therapy: WFL for tasks assessed/performed Overall Cognitive Status: Impaired/Different from baseline Area of Impairment: Attention;Safety/judgement;Following commands                       Following Commands: Follows one step commands with increased time Safety/Judgement: Decreased awareness of safety;Decreased awareness of deficits     General Comments: cues to not stuff her mouth      Exercises      General Comments General comments (skin integrity, edema, etc.): VSS on RA      Pertinent Vitals/Pain Pain Assessment: No/denies pain Faces Pain Scale: Hurts little more Pain Location: BLEs Pain Descriptors / Indicators: Grimacing;Guarding Pain Intervention(s): Monitored during session;Repositioned    Home Living                      Prior Function              PT Goals (current goals can now be found in the care plan section) Acute Rehab PT Goals Patient Stated Goal: get better, play with grandkids Progress towards PT goals: (limited due to lethargy)    Frequency    Min 3X/week      PT Plan Current plan remains appropriate    Co-evaluation              AM-PAC PT "6 Clicks" Mobility   Outcome Measure  Help needed turning from your back to your side while in a flat bed without using bedrails?: A Lot Help needed moving from lying on your back to  sitting on the side of a flat bed without using bedrails?: A Lot Help needed moving to and from a bed to a chair (including a wheelchair)?: Total Help needed standing up from a chair using your arms (e.g., wheelchair or bedside chair)?: Total Help needed to walk in hospital room?: Total Help needed climbing 3-5 steps with a railing? : Total 6 Click Score: 8    End of Session   Activity Tolerance: Patient limited by lethargy Patient left: with bed alarm set;with family/visitor present;with call bell/phone within reach;in bed Nurse Communication: Mobility status       Time: 1520-1539 PT Time Calculation (min) (ACUTE ONLY): 19 min  Charges:  $Therapeutic Activity: 8-22 mins                     Dacia Benton, PT Acute Rehab Services Pager [phone] [facility] [phone] [facility] [phone]    Dacia H Benton 03/25/2019, 3:59 PM   

## 2019-03-25 NOTE — Progress Notes (Signed)
ANTICOAGULATION CONSULT NOTE - [place] for heparin Indication: atrial fibrillation  Allergies  Allergen Reactions  . Wheat Swelling  . Latex Rash  . Penicillins Rash  . Sulfa Antibiotics Rash    Patient Measurements: Height: 5\' 6"  (167.6 cm) Weight: (bedscale broken) IBW/kg (Calculated) : 59.3  Vital Signs: Temp: 98.3 F (36.8 C) (01/28 0300) Temp Source: Axillary (01/28 0300) BP: 100/55 (01/28 0600) Pulse Rate: 60 (01/28 0600)  Labs: Recent Labs    03/22/19 1700 03/23/19 0350 03/23/19 0350 03/23/19 0405 03/23/19 0406 03/24/19 0230 03/24/19 0240 03/25/19 0439  HGB  --  11.6*   < >  --   --  11.0*  --  9.6*  HCT  --  34.8*  --   --   --  32.0*  --  28.7*  PLT  --  227  --   --   --  199  --  149*  LABPROT  --  18.9*  --   --   --  19.6*  --  19.2*  INR  --  1.6*  --   --   --  1.7*  --  1.6*  HEPARINUNFRC   < >  --   --   --  0.64 0.48  --  0.34  CREATININE   < >  --   --  1.94*  --   --  4.08* 3.17*   < > = values in this interval not displayed.    Estimated Creatinine Clearance: 17.9 mL/min (A) (by C-G formula based on SCr of 3.17 mg/dL (H)).   Assessment: 59 yo female with hx AFib, Coumadin on hold, continue heparin bridge.   Heparin level is therapeutic on low end at 0.34 this morning. Hgb dropped from 11 to 9.6 today, Plt 149.   Goal of Therapy:  Heparin level goal 0.3-0.7 Monitor platelets by anticoagulation protocol: Yes   Plan:  Continue IV heparin 1350 units/hr. Daily heparin level and CBC. F/u transition to [place], PharmD PGY1 Pharmacy Resident  Please check AMION for all [place] phone numbers After 10:00 PM, call [place] [phone]   03/25/2019 7:10 AM

## 2019-03-25 NOTE — Progress Notes (Signed)
Patient ID: [name], female   DOB: 1960/09/11, 59 y.o.   MRN: [id number]  This NP visited patient at the bedside as a follow up for palliative medicine needs and emotional support.     Husband at bedside.  Patient is lethargic, but can be aroused with stimulation.  Both remain hopeful for improvement.   Patient is open to all offered and available medical interventions to prolong quality of life.     Active listening and presence   Emotional support offered  Discussed with patient and her husband  the importance of continued conversation with each other  and the  medical providers regarding overall plan of care and treatment options,  ensuring decisions are within the context of the patients values and GOCs.  Plan of care: - limited code/ no CPR, compression, intubation.  Patient requests to leave AICD activated -continue to treat the treatable and hope for improvement  - goal is home with [facility] and OP dilsysis  Questions and concerns addressed     PMT will continue to support holistically  Total time spent on the unit was 25 minutes  This nurse practitioner informed  the patient/family and the attending that I will be out of the hospital until Monday morning.  If the patient is still hospitalized I will follow-up at that time.  Call palliative medicine team phone # [phone] with questions or concerns.  Greater than 50% of the time was spent in counseling and coordination of care  Wadie Lessen NP  Palliative Medicine Team Team Phone # [phone] Pager [phone]

## 2019-03-26 LAB — PROTIME-INR
INR: 1.5 — ABNORMAL HIGH (ref 0.8–1.2)
Prothrombin Time: 17.8 seconds — ABNORMAL HIGH (ref 11.4–15.2)

## 2019-03-26 LAB — RENAL FUNCTION PANEL
Albumin: 2 g/dL — ABNORMAL LOW (ref 3.5–5.0)
Anion gap: 17 — ABNORMAL HIGH (ref 5–15)
BUN: 61 mg/dL — ABNORMAL HIGH (ref 6–20)
CO2: 22 mmol/L (ref 22–32)
Calcium: 8.7 mg/dL — ABNORMAL LOW (ref 8.9–10.3)
Chloride: 90 mmol/L — ABNORMAL LOW (ref 98–111)
Creatinine, Ser: 4.88 mg/dL — ABNORMAL HIGH (ref 0.44–1.00)
GFR calc Af Amer: 11 mL/min — ABNORMAL LOW (ref >60)
GFR calc non Af Amer: 9 mL/min — ABNORMAL LOW (ref >60)
Glucose, Bld: 215 mg/dL — ABNORMAL HIGH (ref 70–99)
Phosphorus: 5.8 mg/dL — ABNORMAL HIGH (ref 2.5–4.6)
Potassium: 4.7 mmol/L (ref 3.5–5.1)
Sodium: 129 mmol/L — ABNORMAL LOW (ref 135–145)

## 2019-03-26 LAB — GLUCOSE, CAPILLARY
Glucose-Capillary: 121 mg/dL — ABNORMAL HIGH (ref 70–99)
Glucose-Capillary: 251 mg/dL — ABNORMAL HIGH (ref 70–99)
Glucose-Capillary: 360 mg/dL — ABNORMAL HIGH (ref 70–99)

## 2019-03-26 LAB — HEPARIN LEVEL (UNFRACTIONATED): Heparin Unfractionated: 0.4 IU/mL (ref 0.30–0.70)

## 2019-03-26 MED ORDER — ACETAMINOPHEN 325 MG PO TABS
ORAL_TABLET | ORAL | Status: AC
  Administered 2019-03-26: 325 mg
  Filled 2019-03-26: qty 2

## 2019-03-26 MED ORDER — HEPARIN SODIUM (PORCINE) 1000 UNIT/ML IJ SOLN
INTRAMUSCULAR | Status: AC
  Administered 2019-03-26: 1000 [IU]
  Filled 2019-03-26: qty 4

## 2019-03-26 NOTE — Procedures (Signed)
I have seen and examined this patient and agree with the plan of care.  Patient is seen during her dialysis treatment 03/26/2019.  Sherril Croon 03/26/2019, 9:42 AM

## 2019-03-26 NOTE — Progress Notes (Signed)
During bedside report patient's husband stated that he wanted to stop care at the hospital and wanted to go home as soon as possible. He said that his wife has been through a lot and just wants her home. He does not want to go through with CIR. Explained that palliative would have to come and talk to him about goals of care tomorrow morning and possibly getting her home as well. Husbands wants the primary MD to come and talk to him about her care, as he did not see MD today. Will pass on to day shift RN.

## 2019-03-26 NOTE — Progress Notes (Signed)
[facility] ROUNDING NOTE   Subjective:   Is a 59 year old lady that was admitted 03/08/2019 with a history of chronic atrial fibrillation complete heart block status post pacemaker placement chronic systolic congestive heart failure with ejection fraction of 15 to 20% liver cirrhosis admitted with increasing swelling of legs and abdomen and a 30 pound weight gain.  She was found to have acute on chronic biventricular heart failure.  She was treated with CRRT 03/18/2019-03/23/2019.  Marland Kitchen  There is concern for cardiac amyloid this is being evaluated by cardiology bone marrow 03/11/2018 showed 14% plasma cells consistent with multiple myeloma  but LV morphology did not appear consistent with AL amyloid.  She is no longer on warfarin due to concern for warfarin skin necrosis.  Continues on IV heparin.  She is currently seen during her dialysis treatment 03/26/2019.  Seems to be proceeding well.  Blood pressure 100/61 pulse 63 temperature 98.9 O2 sats 97% room air  Sodium 129 potassium 4.7 chloride 90 CO2 22 BUN 61 creatinine 4.88 glucose 215 calcium 8.7 phosphorus 5.8 albumin 2.0.BC 11.9 hemoglobin 10.1 platelets 165  No urine output  Allopurinol 150 mg daily, Neurontin 300 mg twice daily insulin sliding scale, glargine 5 units, midodrine 15 mg 3 times daily.   Objective:  Vital signs in last 24 hours:  Temp:  [98 F (36.7 C)-98.9 F (37.2 C)] 98.9 F (37.2 C) (01/29 0339) Pulse Rate:  [59-63] 63 (01/29 0339) Resp:  [18-26] 20 (01/29 0339) BP: (100-109)/(58-62) 100/61 (01/29 0339) SpO2:  [95 %-100 %] 100 % (01/29 0339)  Weight change:  Filed Weights   03/21/19 0500 03/22/19 0500 03/23/19 0356  Weight: 70.8 kg 68.3 kg 67.9 kg    Intake/Output: I/O last 3 completed shifts: In: 678.3 [P.O.:435; I.V.:243.3] Out: 478 [Other:478]   Intake/Output this shift:  No intake/output data recorded. Alert nondistressed CVS- RRR JVP to jaw right IJ tunneled dialysis catheter 2/6 systolic  murmur RS-clear to auscultation ABD- BS present soft non-distended EXT-trace edema   Basic Metabolic Panel: Recent Labs  Lab 03/20/19 0440 03/20/19 1600 03/21/19 0452 03/21/19 1603 03/22/19 0500 03/22/19 1700 03/23/19 0405 03/23/19 0405 03/24/19 0240 03/24/19 0240 03/25/19 0439 03/25/19 1219 03/26/19 0459  NA 133*   < > 130*   < > 129*   < > 127*  --  125*  --  132* 132* 129*  K 4.6   < > 4.9   < > 5.0   < > 5.0  --  5.8*  --  4.0 4.2 4.7  CL 98   < > 94*   < > 93*   < > 94*  --  92*  --  90* 95* 90*  CO2 22   < > 21*   < > 20*   < > 21*  --  19*  --  _0 GLUCOSE 176*   < > 174*   < > 194*   < > 218*  --  263*  --  352* 273* 215*  BUN 25*   < > 25*   < > 25*   < > 29*  --  60*  --  36* 42* 61*  CREATININE 1.89*   < > 1.70*   < > 1.85*   < > 1.94*  --  4.08*  --  3.17* 3.65* 4.88*  CALCIUM 8.8*   < > 9.1   < > 9.1   < > 8.9   < > 9.1   < >  8.7* 8.4* 8.7*  MG 2.5*  --  2.6*  --  2.9*  --  2.8*  --   --   --   --   --   --   PHOS 2.7   < > 2.7   < > 2.9   < > 2.9  --  4.3  --  4.7* 4.7* 5.8*   < > = values in this interval not displayed.    Liver Function Tests: Recent Labs  Lab 03/23/19 0405 03/24/19 0240 03/25/19 0439 03/25/19 1219 03/26/19 0459  ALBUMIN 2.8* 2.6* 2.1* 2.2* 2.0*   No results for input(s): LIPASE, AMYLASE in the last 168 hours. No results for input(s): AMMONIA in the last 168 hours.  CBC: Recent Labs  Lab 03/22/19 0500 03/23/19 0350 03/24/19 0230 03/25/19 0439 03/25/19 1219  WBC 16.1* 15.5* 14.1* 12.5* 11.9*  HGB 11.5* 11.6* 11.0* 9.6* 10.1*  HCT 34.8* 34.8* 32.0* 28.7* 29.6*  MCV 84.5 83.5 81.8 82.7 81.5  PLT 248 227 199 149* 165    Cardiac Enzymes: No results for input(s): CKTOTAL, CKMB, CKMBINDEX, TROPONINI in the last 168 hours.  BNP: Invalid input(s): POCBNP  CBG: Recent Labs  Lab 03/24/19 2143 03/25/19 0655 03/25/19 1143 03/25/19 1640 03/25/19 2109  GLUCAP 308* 341* 247* 279* 245*    Microbiology: Results for  orders placed or performed during the hospital encounter of 03/07/19  Respiratory Panel by RT PCR (Flu A&B, Covid) - Nasopharyngeal Swab     Status: None   Collection Time: 03/07/19  9:21 PM   Specimen: Nasopharyngeal Swab  Result Value Ref Range Status   SARS Coronavirus 2 by RT PCR NEGATIVE NEGATIVE Final    Comment: (NOTE) SARS-CoV-2 target nucleic acids are NOT DETECTED. The SARS-CoV-2 RNA is generally detectable in upper respiratoy specimens during the acute phase of infection. The lowest concentration of SARS-CoV-2 viral copies this assay can detect is 131 copies/mL. A negative result does not preclude SARS-Cov-2 infection and should not be used as the sole basis for treatment or other patient management decisions. A negative result may occur with  improper specimen collection/handling, submission of specimen other than nasopharyngeal swab, presence of viral mutation(s) within the areas targeted by this assay, and inadequate number of viral copies (<131 copies/mL). A negative result must be combined with clinical observations, patient history, and epidemiological information. The expected result is Negative. Fact Sheet for Patients:  PinkCheek.be Fact Sheet for Healthcare Providers:  GravelBags.it This test is not yet ap proved or cleared by the [place] FDA and  has been authorized for detection and/or diagnosis of SARS-CoV-2 by FDA under an Emergency Use Authorization (EUA). This EUA will remain  in effect (meaning this test can be used) for the duration of the COVID-19 declaration under Section 564(b)(1) of the Act, 21 U.S.C. section 360bbb-3(b)(1), unless the authorization is terminated or revoked sooner.    Influenza A by PCR NEGATIVE NEGATIVE Final   Influenza B by PCR NEGATIVE NEGATIVE Final    Comment: (NOTE) The Xpert Xpress SARS-CoV-2/FLU/RSV assay is intended as an aid in  the diagnosis of influenza  from Nasopharyngeal swab specimens and  should not be used as a sole basis for treatment. Nasal washings and  aspirates are unacceptable for Xpert Xpress SARS-CoV-2/FLU/RSV  testing. Fact Sheet for Patients: PinkCheek.be Fact Sheet for Healthcare Providers: GravelBags.it This test is not yet approved or cleared by the [place] FDA and  has been authorized for detection and/or diagnosis of SARS-CoV-2 by  FDA  under an Emergency Use Authorization (EUA). This EUA will remain  in effect (meaning this test can be used) for the duration of the  Covid-19 declaration under Section 564(b)(1) of the Act, 21  U.S.C. section 360bbb-3(b)(1), unless the authorization is  terminated or revoked. Performed at [facility], [address] [place], [place] [address]   MRSA PCR Screening     Status: None   Collection Time: 03/08/19 10:26 PM   Specimen: Nasopharyngeal  Result Value Ref Range Status   MRSA by PCR NEGATIVE NEGATIVE Final    Comment:        The GeneXpert MRSA Assay (FDA approved for NASAL specimens only), is one component of a comprehensive MRSA colonization surveillance program. It is not intended to diagnose MRSA infection nor to guide or monitor treatment for MRSA infections. Performed at [facility], [place] [address] [place], [place] [address]    *Note: Due to a large number of results and/or encounters for the requested time period, some results have not been displayed. A complete set of results can be found in Results Review.    Coagulation Studies: Recent Labs    03/24/19 0230 03/25/19 0439 03/26/19 0459  LABPROT 19.6* 19.2* 17.8*  INR 1.7* 1.6* 1.5*    Urinalysis: No results for input(s): COLORURINE, LABSPEC, PHURINE, GLUCOSEU, HGBUR, BILIRUBINUR, KETONESUR, PROTEINUR, UROBILINOGEN, NITRITE, LEUKOCYTESUR in the last 72 hours.  Invalid input(s): APPERANCEUR    Imaging: No results  found.   Medications:   . sodium chloride 10 mL/hr at 03/14/19 1615  . sodium chloride    . sodium chloride    . heparin 1,350 Units/hr (03/25/19 1100)   . allopurinol  150 mg Oral q morning - 10a  . Chlorhexidine Gluconate Cloth  6 each Topical Q0600  . Chlorhexidine Gluconate Cloth  6 each Topical Q0600  . Chlorhexidine Gluconate Cloth  6 each Topical Q0600  . feeding supplement (NEPRO CARB STEADY)  237 mL Oral TID BM  . gabapentin  300 mg Oral BID  . insulin aspart  0-15 Units Subcutaneous TID WC  . insulin aspart  0-5 Units Subcutaneous QHS  . insulin glargine  5 Units Subcutaneous Daily  . lidocaine (PF)  5 mL Other Once  . lidocaine  10 mL Intradermal Once  . midodrine  15 mg Oral TID WC  . multivitamin  1 tablet Oral QHS  . polyethylene glycol  17 g Oral BID  . senna  1 tablet Oral Daily  . sodium chloride flush  10-40 mL Intracatheter Q12H  . sodium chloride flush  3 mL Intravenous Q12H  . vitamin B-12  100 mcg Oral Daily   sodium chloride, sodium chloride, sodium chloride, acetaminophen **OR** acetaminophen, alteplase, heparin, heparin, heparin, lidocaine, lidocaine-prilocaine, ondansetron **OR** ondansetron (ZOFRAN) IV, pentafluoroprop-tetrafluoroeth, sodium chloride, sodium chloride flush, sodium chloride flush, traMADol  Assessment/ Plan:   Acute kidney injury on chronic kidney disease stage IV with a baseline creatinine about 3 mg/dL.  On admission 5 mg/dL thought to be secondary to cardiorenal syndrome.  Edema has significantly improved.  CRRT 03/18/2019-03/23/2019.  has been tolerating dialysis 03/26/2019.  It appears that hypotension may be limiting ability to effectively ultrafilter fluid.  We will continue to follow.  Patient is weak will probably need admission to the nursing facility.  I discussed with Dr. Marigene Ehlers.  At some point she will need placement of permanent access.  This will depend on if she manages to continue to tolerate dialysis.  Hypertension/volume  continue volume removal.  CRRT since 03/18/2019-03/23/2019 t.  Increase midodrine to 15 mg 3 times a day in order to support blood pressure for dialysis.  Metabolic acidosis resolved  Hyperkalemia resolved with dialysis  Warfarin skin necrosis biopsy negative for calciphylaxis would continue off warfarin as form of anticoagulation  Chronic atrial fibrillation IV heparin  Diabetes mellitus as per primary service  Multiple myeloma status post bone marrow biopsy consistent with this diagnosis  Palliative medicine consulted and appreciate help from Joseph Pierini.    LOS: [place] _0 _1 :39 AM

## 2019-03-26 NOTE — Progress Notes (Signed)
Inpatient Rehabilitation-Admissions Coordinator   AC met with pt and her husband at the bedside this afternoon as follow up regarding IP Rehab recommendation. Pt's husband adamant about wanting to get her out of the hospital environment and taking her home. He favors outpatient or home health therapy. We discussed the differences between all types of rehab and the difference between intensity levels. Pt and husband aware that with Medicaid, she may get limited home and outpatient services, if any. They are still wanting to pursue home when ready.   AC will sign off.   TOC team aware.   Kelly Gentry, OTR/L  Rehab Admissions Coordinator  [phone] 03/26/2019 1:46 PM  

## 2019-03-26 NOTE — Progress Notes (Signed)
ANTICOAGULATION CONSULT NOTE - [place] for heparin Indication: atrial fibrillation  Allergies  Allergen Reactions  . Wheat Swelling  . Latex Rash  . Penicillins Rash  . Sulfa Antibiotics Rash    Patient Measurements: Height: 5\' 6"  (167.6 cm) Weight: (bedscale broken) IBW/kg (Calculated) : 59.3  Vital Signs: Temp: 98.9 F (37.2 C) (01/29 0339) Temp Source: Oral (01/29 0339) BP: 100/61 (01/29 0339) Pulse Rate: 63 (01/29 0339)  Labs: Recent Labs    03/24/19 0230 03/24/19 0240 03/25/19 0439 03/25/19 1219 03/26/19 0459  HGB 11.0*   < > 9.6* 10.1*  --   HCT 32.0*  --  28.7* 29.6*  --   PLT 199  --  149* 165  --   LABPROT 19.6*  --  19.2*  --  17.8*  INR 1.7*  --  1.6*  --  1.5*  HEPARINUNFRC 0.48  --  0.34  --  0.40  CREATININE  --    < > 3.17* 3.65* 4.88*   < > = values in this interval not displayed.    Estimated Creatinine Clearance: 11.6 mL/min (A) (by C-G formula based on SCr of 4.88 mg/dL (H)).   Assessment: 59 yo female with hx AFib, Coumadin on hold, continue heparin bridge.   Heparin level is therapeutic this morning.  No CBC done today.  No overt bleeding or complications noted.   Goal of Therapy:  Heparin level goal 0.3-0.7 Monitor platelets by anticoagulation protocol: Yes   Plan:  Continue IV heparin 1350 units/hr. Daily heparin level and CBC. F/u transition to Eliquis after permanent HD access placed.  Marguerite Olea, [facility] Clinical Pharmacist Phone [phone]  03/26/2019 12:56 PM

## 2019-03-26 NOTE — Progress Notes (Signed)
Physical Therapy Treatment Patient Details Name: [name] MRN: [id number] DOB: 1960-07-06 Today's Date: 03/26/2019    History of Present Illness 59 year old with PMH significant for MGUS, thrombocytopenia, A. fib on Coumadin, CHB with pacemaker, nonischemic cardiomyopathy, CKD stage IV baseline creatinine 3.0, cirrhosis who presented with several weeks of progressive swelling about 30 pound weight gain. critically ill due to acute respiratory failure requiring BiPAP due to acute pulmonary edema due to acute decompensated HFrEF in context of stage V ESRD; new diagnosis of Multiple Myeloma.    PT Comments    Pt motivated to participate in OOB mobility this session. Pt ambulated room distance with RW, requiring min assist for steadying and guiding pt/RW. Pt with one posterior LOB, requiring PT assist to correct. Pt's husband in room, educated that pt will need to use gait belt and have someone with her whenever she mobilizes for pt safety. PT again tried to encourage CIR for pt safety and progression of mobility, pt and pt's husband decline as pt's husband states "I think she has been here long enough, she needs to go home". PT to continue to follow acutely.    Follow Up Recommendations  CIR;Supervision for mobility/OOB;Supervision/Assistance - 24 hour     Equipment Recommendations  Rolling walker with 5" wheels;3in1 (PT);Wheelchair (measurements PT);Wheelchair cushion (measurements PT)    Recommendations for Other Services       Precautions / Restrictions Precautions Precautions: Fall Precaution Comments: myoclonus, knees buckle Restrictions Weight Bearing Restrictions: No    Mobility  Bed Mobility Overal bed mobility: Needs Assistance Bed Mobility: Supine to Sit;Sit to Supine     Supine to sit: Min assist;HOB elevated Sit to supine: Mod assist;HOB elevated   General bed mobility comments: min-mod assist for LE lifting and translation into and out of bed, positioning upon  return to supine. Pt with use of bedrails to come to sitting.  Transfers Overall transfer level: Needs assistance Equipment used: Rolling walker (2 wheeled) Transfers: Sit to/from Stand Sit to Stand: Min assist         General transfer comment: min assist for power up, steadying. Increased time to rise, verbal cuing for hand placement when rising.  Ambulation/Gait Ambulation/Gait assistance: Min assist;Mod assist Gait Distance (Feet): 25 Feet Assistive device: Rolling walker (2 wheeled) Gait Pattern/deviations: Step-to pattern;Step-through pattern;Leaning posteriorly;Narrow base of support;Decreased step length - right;Decreased step length - left Gait velocity: decr   General Gait Details: Min assist for steadying, verbal cuing for placement in RW, upright posture as pt with significant posterior leaning especially with changes in directions. posterior LOB x1, requiring mod assist from PT to correct with use of gait belt and PT thigh propping.   Stairs             Wheelchair Mobility    Modified Rankin (Stroke Patients Only)       Balance Overall balance assessment: Needs assistance Sitting-balance support: Feet supported;No upper extremity supported Sitting balance-Leahy Scale: Fair Sitting balance - Comments: able to sit EOB without PT support   Standing balance support: Bilateral upper extremity supported;During functional activity Standing balance-Leahy Scale: Poor Standing balance comment: reliant on external support, LOB x1 requiring PT to correct                            Cognition Arousal/Alertness: Awake/alert Behavior During Therapy: WFL for tasks assessed/performed Overall Cognitive Status: Impaired/Different from baseline Area of Impairment: Attention;Safety/judgement;Following commands;Problem solving  Current Attention Level: Sustained   Following Commands: Follows one step commands with increased  time Safety/Judgement: Decreased awareness of safety;Decreased awareness of deficits   Problem Solving: Slow processing;Decreased initiation;Difficulty sequencing;Requires verbal cues;Requires tactile cues General Comments: pt appears fatigued, but awake and alert this session. Pt lacking insight into deficits, stating she doesn't need help then lost balance requiring PT to correct.      Exercises General Exercises - Lower Extremity Hip Flexion/Marching: AROM;Both;10 reps;Standing    General Comments General comments (skin integrity, edema, etc.): VSS      Pertinent Vitals/Pain Pain Assessment: Faces Faces Pain Scale: Hurts little more Pain Location: BLEs Pain Descriptors / Indicators: Discomfort;Sore Pain Intervention(s): Limited activity within patient's tolerance;Monitored during session;Repositioned    Home Living                      Prior Function            PT Goals (current goals can now be found in the care plan section) Acute Rehab PT Goals Patient Stated Goal: get better, play with grandkids PT Goal Formulation: With patient Time For Goal Achievement: 04/04/19 Potential to Achieve Goals: Fair Progress towards PT goals: Progressing toward goals    Frequency    Min 3X/week      PT Plan Current plan remains appropriate    Co-evaluation              AM-PAC PT "6 Clicks" Mobility   Outcome Measure  Help needed turning from your back to your side while in a flat bed without using bedrails?: A Little Help needed moving from lying on your back to sitting on the side of a flat bed without using bedrails?: A Little Help needed moving to and from a bed to a chair (including a wheelchair)?: A Lot Help needed standing up from a chair using your arms (e.g., wheelchair or bedside chair)?: A Lot Help needed to walk in hospital room?: A Lot Help needed climbing 3-5 steps with a railing? : Total 6 Click Score: 13    End of Session Equipment Utilized  During Treatment: Gait belt Activity Tolerance: Patient tolerated treatment well Patient left: in bed;with bed alarm set;with call bell/phone within reach;with family/visitor present Nurse Communication: Mobility status PT Visit Diagnosis: Unsteadiness on feet (R26.81);Other abnormalities of gait and mobility (R26.89);Muscle weakness (generalized) (M62.81)     Time: 1657-1730 PT Time Calculation (min) (ACUTE ONLY): 33 min  Charges:  $Gait Training: 8-22 mins $Therapeutic Activity: 8-22 mins                     Bradley Bostelman E, PT Acute Rehabilitation Services Pager [phone]  Office [phone]   Lafawn Lenoir D Elonda Husky 03/26/2019, 6:03 PM

## 2019-03-26 NOTE — Progress Notes (Signed)
Patient ID: [name], female   DOB: 21-Jun-1960, 59 y.o.   MRN: [id number] P    Advanced Heart Failure Rounding Note  PCP-Cardiologist: Kate Sable, MD   Subjective:    Patient seen at HD today.  She is tolerating it reasonably well.  SBP 100s this morning on midodrine 15 tid.   Still lethargic, PT recommend CIR placement.   New diagnosis of multiple myeloma. Bx for possible calciphylaxis was negative (? false negative).   Echo: EF < 20%, no LVH, mildly reduced RV systolic function, severe biatrial enlargement, severe TR.  RHC Procedural Findings (dobutamine 2.5 mcg/kg/min): Hemodynamics (mmHg) RA 18 RV 43/18 PA 45/19, mean 28 PCWP mean 19 Oxygen saturations: PA 63% AO 94% Cardiac Output (Fick) 6.1  Cardiac Index (Fick) 3.06 PVR 1.5 WU PAPI 1.4  CT chest w/o contrast: Atelectasis, no significant pulmonary disease.    Objective:   Weight Range: 67.9 kg Body mass index is 24.16 kg/m.   Vital Signs:   Temp:  [98 F (36.7 C)-98.9 F (37.2 C)] 98.9 F (37.2 C) (01/29 0339) Pulse Rate:  [59-63] 63 (01/29 0339) Resp:  [18-26] 20 (01/29 0339) BP: (100-109)/(58-62) 100/61 (01/29 0339) SpO2:  [95 %-100 %] 100 % (01/29 0339) Last BM Date: 03/25/19  Weight change: Filed Weights   03/21/19 0500 03/22/19 0500 03/23/19 0356  Weight: 70.8 kg 68.3 kg 67.9 kg    Intake/Output:   Intake/Output Summary (Last 24 hours) at 03/26/2019 0858 Last data filed at 03/25/2019 2000 Gross per 24 hour  Intake 502.53 ml  Output --  Net 502.53 ml      Physical Exam   General: NAD Neck: No JVD, no thyromegaly or thyroid nodule.  Lungs: Clear to auscultation bilaterally with normal respiratory effort. CV: Lateral PMI.  Heart regular S1/S2, no S3/S4, no murmur.  No peripheral edema.   Abdomen: Soft, nontender, no hepatosplenomegaly, no distention.  Skin: Intact without lesions or rashes.  Neurologic: Alert and oriented x 3.  Psych: Normal affect. Extremities: No  clubbing or cyanosis.  HEENT: Normal.    Telemetry   A fib BiV pacing 60 Personally reviewed    Labs    CBC Recent Labs    03/25/19 0439 03/25/19 1219  WBC 12.5* 11.9*  HGB 9.6* 10.1*  HCT 28.7* 29.6*  MCV 82.7 81.5  PLT 149* 275   Basic Metabolic Panel Recent Labs    03/25/19 1219 03/26/19 0459  NA 132* 129*  K 4.2 4.7  CL 95* 90*  CO2 24 22  GLUCOSE 273* 215*  BUN 42* 61*  CREATININE 3.65* 4.88*  CALCIUM 8.4* 8.7*  PHOS 4.7* 5.8*   Liver Function Tests Recent Labs    03/25/19 1219 03/26/19 0459  ALBUMIN 2.2* 2.0*   No results for input(s): LIPASE, AMYLASE in the last 72 hours. Cardiac Enzymes No results for input(s): CKTOTAL, CKMB, CKMBINDEX, TROPONINI in the last 72 hours.  BNP: BNP (last 3 results) Recent Labs    03/07/19 2101  BNP 1,072.0*    ProBNP (last 3 results) No results for input(s): PROBNP in the last 8760 hours.   D-Dimer No results for input(s): DDIMER in the last 72 hours. Hemoglobin A1C No results for input(s): HGBA1C in the last 72 hours. Fasting Lipid Panel No results for input(s): CHOL, HDL, LDLCALC, TRIG, CHOLHDL, LDLDIRECT in the last 72 hours. Thyroid Function Tests No results for input(s): TSH, T4TOTAL, T3FREE, THYROIDAB in the last 72 hours.  Invalid input(s): FREET3  Other results:  Imaging    No results found.   Medications:     Scheduled Medications: . allopurinol  150 mg Oral q morning - 10a  . Chlorhexidine Gluconate Cloth  6 each Topical Q0600  . Chlorhexidine Gluconate Cloth  6 each Topical Q0600  . Chlorhexidine Gluconate Cloth  6 each Topical Q0600  . feeding supplement (NEPRO CARB STEADY)  237 mL Oral TID BM  . gabapentin  300 mg Oral BID  . insulin aspart  0-15 Units Subcutaneous TID WC  . insulin aspart  0-5 Units Subcutaneous QHS  . insulin glargine  5 Units Subcutaneous Daily  . lidocaine (PF)  5 mL Other Once  . lidocaine  10 mL Intradermal Once  . midodrine  15 mg Oral TID WC  .  multivitamin  1 tablet Oral QHS  . polyethylene glycol  17 g Oral BID  . senna  1 tablet Oral Daily  . sodium chloride flush  10-40 mL Intracatheter Q12H  . sodium chloride flush  3 mL Intravenous Q12H  . vitamin B-12  100 mcg Oral Daily    Infusions: . sodium chloride 10 mL/hr at 03/14/19 1615  . sodium chloride    . sodium chloride    . heparin 1,350 Units/hr (03/25/19 1100)    PRN Medications: sodium chloride, sodium chloride, sodium chloride, acetaminophen **OR** acetaminophen, alteplase, heparin, heparin, heparin, lidocaine, lidocaine-prilocaine, ondansetron **OR** ondansetron (ZOFRAN) IV, pentafluoroprop-tetrafluoroeth, sodium chloride, sodium chloride flush, sodium chloride flush, traMADol    Assessment/Plan   1. Acute Hypoxic Respiratory Failure: Resolved, off oxygen. 2. 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  Last echo in 2/19 with EF 15-20%. She has a [employer] CRT-D device. Admitted with progressive dyspnea and 30 lb weight gain. Empirically started on dobutamine 2.5 due to concern for low output. RHC showed R>L heart failure with low PAPI adequate cardiac output on dobutamine. Now off dobutamine.  Initially started on iHD after she failed high dose diuretics, then CVVH with intractable volume overload.  Weight down 50 lbs from baseline, now off CVVH.  She will be HD dependent.   - Continue midodrine 15 mg tid with relative hypotension and need for iHD.  - HD today.    - Stopped digoxin and benazepril with AKI.  - Stopped beta blocker with concern for low output.  - No BP room for hydralazine/nitrates.  - Cardiac amyloidosis is a concern with myeloma, but echo myocardial appearance is not consistent. BM biopsy negative for amyloidosis.  - LE u/s negative for DVT 2. AKI on CKD stage 4: Baseline creatinine around 3, went up to 5 with BUN 150 this admission. Suspect cardiorenal syndrome superimposed on possible diabetic nephropathy. She will be  dialysis-dependent going forward. - HD again today.  - Nephrology assistance appreciated. She will need permanent catheter access.  3. Chronic atrial fibrillation: Warfarin held for procedures, now on heparin gtt. Long-term, will transition to apixaban with some concern for warfarin skin necrosis.    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  5. DM: Per primary.  6. Monoclonal gammopathy:  LV morphology does not look consistent with AL amyloidosis. She has been seen by hematology, has had bone marrow bx showing 14% plasma cells by flow cytometry on the aspirate which is by definition multiple myeloma. Seen by heme/onc, plan for outpatient treatment.  BM bx negative for amyloidosis.  7. Tricuspid regurgitation: Severe by echo.  8.  Subcutaneous nodules: ?calciphylaxis versus less likely warfarin skin necrosis.  - Off warfarin, will  use Eliquis in future.  - Bx negative for calciphylaxis. (? False negative)  - Renal managing for possible calciphylaxis.  9. Deconditioning: Needs aggressive PT and possibly to CIR eventually => will make CIR referral today.  10. Poor prognosis.  Not sure she will tolerate iHD with relative hypotension.  Palliative care following, she is now a limited code.    CIR referral, will need permanent HD access. Start apixaban and stop heparin after permanent access.   Length of Stay: 16  Loralie Champagne, MD  03/26/2019, 8:58 AM  Advanced Heart Failure Team Pager [phone] (M-F; 7a - 4p)  Please contact [facility] for night-coverage after hours (4p -7a ) and weekends on [email or web address]

## 2019-03-27 LAB — CBC
HCT: 27.6 % — ABNORMAL LOW (ref 36.0–46.0)
Hemoglobin: 9.2 g/dL — ABNORMAL LOW (ref 12.0–15.0)
MCH: 27.4 pg (ref 26.0–34.0)
MCHC: 33.3 g/dL (ref 30.0–36.0)
MCV: 82.1 fL (ref 80.0–100.0)
Platelets: 182 10*3/uL (ref 150–400)
RBC: 3.36 MIL/uL — ABNORMAL LOW (ref 3.87–5.11)
RDW: 20.8 % — ABNORMAL HIGH (ref 11.5–15.5)
WBC: 11 10*3/uL — ABNORMAL HIGH (ref 4.0–10.5)
nRBC: 0.8 % — ABNORMAL HIGH (ref 0.0–0.2)

## 2019-03-27 LAB — RENAL FUNCTION PANEL
Albumin: 1.9 g/dL — ABNORMAL LOW (ref 3.5–5.0)
Anion gap: 15 (ref 5–15)
BUN: 38 mg/dL — ABNORMAL HIGH (ref 6–20)
CO2: 26 mmol/L (ref 22–32)
Calcium: 8.4 mg/dL — ABNORMAL LOW (ref 8.9–10.3)
Chloride: 83 mmol/L — ABNORMAL LOW (ref 98–111)
Creatinine, Ser: 3.73 mg/dL — ABNORMAL HIGH (ref 0.44–1.00)
GFR calc Af Amer: 15 mL/min — ABNORMAL LOW (ref >60)
GFR calc non Af Amer: 13 mL/min — ABNORMAL LOW (ref >60)
Glucose, Bld: 386 mg/dL — ABNORMAL HIGH (ref 70–99)
Phosphorus: 4.6 mg/dL (ref 2.5–4.6)
Potassium: 4.3 mmol/L (ref 3.5–5.1)
Sodium: 124 mmol/L — ABNORMAL LOW (ref 135–145)

## 2019-03-27 LAB — GLUCOSE, CAPILLARY
Glucose-Capillary: 370 mg/dL — ABNORMAL HIGH (ref 70–99)
Glucose-Capillary: 269 mg/dL — ABNORMAL HIGH (ref 70–99)
Glucose-Capillary: 225 mg/dL — ABNORMAL HIGH (ref 70–99)
Glucose-Capillary: 228 mg/dL — ABNORMAL HIGH (ref 70–99)

## 2019-03-27 LAB — HEPARIN LEVEL (UNFRACTIONATED): Heparin Unfractionated: 0.23 IU/mL — ABNORMAL LOW (ref 0.30–0.70)

## 2019-03-27 LAB — PROTIME-INR
INR: 1.5 — ABNORMAL HIGH (ref 0.8–1.2)
Prothrombin Time: 18.2 seconds — ABNORMAL HIGH (ref 11.4–15.2)

## 2019-03-27 MED ORDER — VANCOMYCIN HCL IN DEXTROSE 1-5 GM/200ML-% IV SOLN
1000.0000 mg | INTRAVENOUS | Status: AC
  Administered 2019-03-28: 1000 mg via INTRAVENOUS
  Filled 2019-03-27: qty 200

## 2019-03-27 MED ORDER — GABAPENTIN 300 MG PO CAPS
300.0000 mg | ORAL_CAPSULE | Freq: Every day | ORAL | Status: DC
  Administered 2019-03-29: 300 mg via ORAL
  Filled 2019-03-27 (×2): qty 1

## 2019-03-27 NOTE — Progress Notes (Signed)
Spoke with Dr. Ronny Bacon of interventional radiology, plan to place tunneled catheter tomorrow. Order placed.

## 2019-03-27 NOTE — Progress Notes (Signed)
ANTICOAGULATION CONSULT NOTE - Follow Up Consult  Pharmacy Consult for Heparin Indication: atrial fibrillation  Allergies  Allergen Reactions  . Wheat Swelling  . Latex Rash  . Penicillins Rash  . Sulfa Antibiotics Rash    Patient Measurements: Height: 5\' 6"  (167.6 cm) Weight: 124 lb (56.2 kg)(May be innacurate) IBW/kg (Calculated) : 59.3 Heparin Dosing Weight: 56.2 kg  Vital Signs: Temp: 98.1 F (36.7 C) (01/30 0750) Temp Source: Oral (01/30 0750) BP: 101/57 (01/30 0427) Pulse Rate: 58 (01/30 0750)  Labs: Recent Labs    03/25/19 0439 03/25/19 0439 03/25/19 1219 03/26/19 0459 03/27/19 0549  HGB 9.6*   < > 10.1*  --  9.2*  HCT 28.7*  --  29.6*  --  27.6*  PLT 149*  --  165  --  182  LABPROT 19.2*  --   --  17.8* 18.2*  INR 1.6*  --   --  1.5* 1.5*  HEPARINUNFRC 0.34  --   --  0.40 0.23*  CREATININE 3.17*   < > 3.65* 4.88* 3.73*   < > = values in this interval not displayed.    Estimated Creatinine Clearance: 14.4 mL/min (A) (by C-G formula based on SCr of 3.73 mg/dL (H)).   Assessment:  Anticoag: warf PTA for AF held with procedures, bridged with IV hep. HL 0.23 this AM but RN reports bag ran dry. INR 1.5. Hgb 10.1>9.2. Plts stable. -Concern for warf skin necrosis vs calciphylaxis.  Goal of Therapy:  Heparin level 0.3-0.7 units/ml Monitor platelets by anticoagulation protocol: Yes   Plan:  -Con't heparin 1350/hr, f/u switch to Eliquis once permanent HD access placed. - Daily HL and CBC - Husband reports he now wants to stop care and take pt home. F/u plans    Graydon Fofana S. [facility], PharmD, BCPS Clinical Staff Pharmacist [email or web address] [facility], [employer] 03/27/2019,7:51 AM

## 2019-03-27 NOTE — Consult Note (Addendum)
Chief Complaint: Patient was seen in consultation today for tunneled dialysis catheter placement Chief Complaint  Patient presents with  . Leg Swelling    pain/shortnes of breath   at the request of Dr Justin Mend   Supervising Physician: Sandi Mariscal  Patient Status: [facility] - In-pt  History of Present Illness: [name] is a 59 y.o. 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  On admission 5 mg/dL thought to be secondary to cardiorenal syndrome.  Edema has significantly improved.  CRRT 03/18/2019-03/23/2019.  has been tolerating dialysis 03/26/2019 with 2 L removed.  It appears that hypotension may be limiting ability to effectively ultrafilter fluid.  We will continue to follow.  Patient is weak will probably need admission to the nursing facility.  I discussed with Dr. Marigene Ehlers.  At some point she will need placement of permanent access.  This will depend on if she manages to continue to tolerate dialysis.  Request made for tunneled dialysis catheter per Nephrology And per Dr Aundra Dubin  Will plan for placement 1/31 - if can  Past Medical History:  Diagnosis Date  . Automatic implantable cardioverter-defibrillator in situ    a. 03/2013: BSX Energen CRTD BiV ICC, ser# [id number]  . Chronic anticoagulation    a. coumadin.  . Chronic atrial fibrillation (HCC)    embolic rind  . Chronic kidney disease    creatinine- 1.8 in 09/2006 and 2.0 in 05/2007; 2.05 in 2011, 2.29 in 2012  . Chronic systolic CHF (congestive heart failure) ([place])    a. 03/2013 Echo: Sev LV dysfxn with sev diff HK, restrictive phys, diast dysfxn, mild MR, sev dil LA/RA, mod PR, PASP 59mHg.  . Congenital third degree heart block    a. Guidant VVI pacemaker implanted in 09/1997; b. gen change in  01/2004;  c. 03/2013 upgrade to [place] ser# [id number]  . Dizziness and giddiness 05/19/2012  . Dysphagia 08/14/2011   FEB 2013 EGD/DIL 16 MM; GERD; h/o gastroesophageal reflux disease; + H. Pylori gastritis   . GERD (gastroesophageal reflux disease)   . Helicobacter pylori gastritis 08/14/2011  . IDDM (insulin dependent diabetes mellitus)   . Kidney stones 1990's  . [place]ischemic cardiomyopathy [place]    a. 03/2013 Echo: Sev LV dysfxn with sev diff HK, restrictive phys, diast dysfxn, mild MR, sev dil LA/RA, mod PR, PASP 558mg;  b. 03/2013: BSX Energen CRTD BiV ICC, ser# [id number] . Marland Kitchenotassium (K) excess   . [facility]   denies residual on 04/21/2013    Past Surgical History:  Procedure Laterality Date  . BI-VENTRICULAR IMPLANTABLE CARDIOVERTER DEFIBRILLATOR  (CRT-D)  04/21/2013  . BI-VENTRICULAR IMPLANTABLE CARDIOVERTER DEFIBRILLATOR UPGRADE N/A 04/21/2013   Procedure: BI-VENTRICULAR IMPLANTABLE CARDIOVERTER DEFIBRILLATOR UPGRADE;  Surgeon: GrEvans LanceMD;  [facility];  Service: Cardiovascular;  Laterality: N/A;  . COLONOSCOPY  04/23/2011   SLWUJ:WJXBJYNWGNOLON POLYP/ INTERNAL HEMORRHOIDS/ TORTUOUS COLON  . INSERT / REPLACE / REMOVE PACEMAKER  09/1997   Guidant VVI [employer]  . IR FLUORO GUIDE CV LINE RIGHT  03/12/2019  . IR [place] VASC ACCESS RIGHT  03/12/2019  . PACEMAKER GENERATOR CHANGE  01/2004  . RIGHT HEART CATH N/A 03/09/2019   Procedure: RIGHT HEART CATH;  Surgeon: McLarey DresserMD;  Location: [place] CV LAB;  Service: Cardiovascular;  Laterality: N/A;  . UPPER GASTROINTESTINAL ENDOSCOPY  FEB 2013   RING/DIL TO 16 MM, H PYLORI GASTRITIS    Allergies: Wheat, Latex, Penicillins, and Sulfa antibiotics  Medications: Prior to Admission medications   Medication Sig Start Date End Date Taking? Authorizing Provider  ACCU-CHEK GUIDE test strip 1 each by Other route 4 (four) times daily.  02/25/19  Yes [provider]    acetaminophen (TYLENOL) 500 MG tablet Take 500 mg by mouth every 6 (six) hours as needed for moderate pain.   Yes [provider]  Alcohol Swabs (ALCOHOL PREP) PADS 1 each by Does not apply route daily. 02/11/18  Yes Elayne Snare, MD  allopurinol (ZYLOPRIM) 100 MG tablet Take 150 mg by mouth every morning. Reported on 06/15/2015   Yes [provider]  benazepril (LOTENSIN) 10 MG tablet Take 10 mg by mouth daily.   Yes [provider]  calcitRIOL (ROCALTROL) 0.25 MCG capsule Take 0.5 mcg by mouth daily. Take 2 tablets by mouth once daily.   Yes [provider]  cholecalciferol (VITAMIN D3) 25 MCG (1000 UT) tablet Take 1,000 Units by mouth daily.   Yes [provider]  COREG CR 40 MG 24 hr capsule TAKE ONE CAPSULE BY MOUTH DAILY. 11/24/17  Yes Evans Lance, MD  digoxin (LANOXIN) 0.125 MG tablet TAKE 1 TABLET BY MOUTH DAILY EXCEPT ON SATURDAY AND SUNDAY. Patient taking differently: Take 0.125 mg by mouth daily. Except on Saturday and Sunday 11/27/18  Yes Herminio Commons, MD  insulin NPH Human (HUMULIN N) 100 UNIT/ML injection Inject 0.34 mLs (34 Units total) into the skin daily. Patient taking differently: Inject 22 Units into the skin daily.  07/22/18  Yes Elayne Snare, MD  Insulin Pen Needle 31G X 5 MM MISC 1 each by Does not apply route daily. Use as instructed to check blood sugar 4 times daily. 02/11/18  Yes Elayne Snare, MD  Insulin Syringe-Needle U-100 30G 1 ML MISC 1 each by Does not apply route daily. Use needle to inject insulin 3 times daily. 02/13/18  Yes Elayne Snare, MD  LITETOUCH PEN NEEDLES 31G X 8 MM MISC USE AS DIRECTED UP TO 4 TIMES DAILY. Patient taking differently: See admin instructions.  01/27/19  Yes Elayne Snare, MD  meclizine (ANTIVERT) 12.5 MG tablet Take 12.5 mg by mouth 3 (three) times daily as needed. 02/11/19  Yes [provider]  metolazone (ZAROXOLYN) 2.5 MG tablet Take 2.5 mg by mouth daily as needed. 02/01/19  Yes  [provider]  NOVOLOG FLEXPEN 100 UNIT/ML FlexPen INJECT SUBCUTANEOUSLY 12 UNITS BEFORE BREAKFAST; 14 UNITS AT LUNCH; AND 16 UNITS AT DINNER. Patient taking differently: See admin instructions. INJECT SUBCUTANEOUSLY 12 UNITS BEFORE BREAKFAST; 14 UNITS AT LUNCH; AND 16 UNITS AT [facility] 01/27/19  Yes Elayne Snare, MD  SURE COMFORT INSULIN SYRINGE 31G X 5/16" 0.5 ML MISC INJECT ONCE DAILY AS DIRECTED. Patient taking differently: See admin instructions. INJECT ONCE DAILY AS DIRECTED. 01/27/19  Yes Elayne Snare, MD  torsemide (DEMADEX) 20 MG tablet Take 1 tablet (20 mg total) by mouth 2 (two) times daily. 03/05/19 06/03/19 Yes Herminio Commons, MD  traMADol (ULTRAM) 50 MG tablet Take 1 tablet (50 mg total) by mouth 2 (two) times daily as needed. 03/03/19  Yes Derek Jack, MD  TRESIBA FLEXTOUCH 200 UNIT/ML SOPN INJECT 20 UNITS INTO THE SKIN DAILY BEFORE BREAKFAST. Patient taking differently: Inject 56 Units into the skin daily before  breakfast.  10/25/18  Yes Elayne Snare, MD  VICTOZA 18 MG/3ML SOPN INJECT 1.2MG SUBCUTANEOUSLY DAILY AS DIRECTED. Patient taking differently: Inject 1.2 mg into the skin daily. INJECT 1.2MG SUBCUTANEOUSLY DAILY AS DIRECTED. 01/27/19  Yes Elayne Snare, MD  warfarin (COUMADIN) 5 MG tablet TAKE 1/2 DAILY EXCEPT ON WEDNESDAY TAKE 1 TABLET. Patient taking differently: Take 2.5 mg by mouth daily. TAKE 1/2 DAILY. 06/30/18  Yes Herminio Commons, MD  diclofenac Sodium (VOLTAREN) 1 % GEL APPLY TO AFFECTED AREASETWICE DAILY. 02/23/19   [provider]     Family History  Adopted: Yes  Problem Relation Age of Onset  . Autism Son   . Seizures Son   . Diabetes Daughter   . Colon cancer Neg Hx   . Colon polyps Neg Hx     Social History   Socioeconomic History  . Marital status: Married    Spouse name: Not on file  . Number of children: 4  . Years of education: Not on file  . Highest education level: Not on file  Occupational History    Employer:  UNEMPLOYED  Tobacco Use  . Smoking status: Never Smoker  . Smokeless tobacco: Never Used  . Tobacco comment: Never smoked  Substance and Sexual Activity  . Alcohol use: No    Alcohol/week: 0.0 standard drinks  . Drug use: No  . Sexual activity: Not Currently    Birth control/protection: Post-menopausal  Other Topics Concern  . Not on file  Social History Narrative  . Not on file   Social Determinants of Health   Financial Resource Strain:   . Difficulty of Paying Living Expenses: Not on file  Food Insecurity:   . Worried About Charity fundraiser in the Last Year: Not on file  . Ran Out of Food in the Last Year: Not on file  Transportation Needs:   . Lack of Transportation (Medical): Not on file  . Lack of Transportation (Non-Medical): Not on file  Physical Activity:   . Days of Exercise per Week: Not on file  . Minutes of Exercise per Session: Not on file  Stress:   . Feeling of Stress : Not on file  Social Connections:   . Frequency of Communication with Friends and Family: Not on file  . Frequency of Social Gatherings with Friends and Family: Not on file  . Attends Religious Services: Not on file  . Active Member of Clubs or Organizations: Not on file  . Attends Archivist Meetings: Not on file  . Marital Status: Not on file    Review of Systems: A 12 point ROS discussed and pertinent positives are indicated in the HPI above.  All other systems are negative.   Vital Signs: BP (!) 101/57 (BP Location: Left Arm)   Pulse (!) 58   Temp 98.1 F (36.7 C) (Oral)   Resp (!) 21   Ht 5' 6"  (1.676 m)   Wt 124 lb (56.2 kg) Comment: May be innacurate  SpO2 100%   BMI 20.01 kg/m   Physical Exam Vitals reviewed.  Cardiovascular:     Rate and Rhythm: Bradycardia present.     Heart sounds: Normal heart sounds.  Pulmonary:     Breath sounds: Wheezing present.  Skin:    General: Skin is warm.  Neurological:     Comments: No response   Psychiatric:      Comments: Husband at bedside Consented for tunneled dialysis catheter placement     Imaging: CT ABDOMEN  PELVIS WO CONTRAST  Result Date: 03/08/2019 CLINICAL DATA:  Abdominal distension EXAM: CT ABDOMEN AND PELVIS WITHOUT CONTRAST TECHNIQUE: Multidetector CT imaging of the abdomen and pelvis was performed following the standard protocol without IV contrast. COMPARISON:  None. FINDINGS: LOWER CHEST: Cardiomegaly bibasilar atelectasis. HEPATOBILIARY: Normal hepatic contours. No intra- or extrahepatic biliary dilatation. There is cholelithiasis without acute inflammation. PANCREAS: Normal pancreas. No ductal dilatation or peripancreatic fluid collection. SPLEEN: Normal. ADRENALS/URINARY TRACT: The adrenal glands are normal. No hydronephrosis, nephroureterolithiasis or solid renal mass. The urinary bladder is normal for degree of distention STOMACH/BOWEL: There is no hiatal hernia. Normal duodenal course and caliber. No small bowel dilatation or inflammation. No focal colonic abnormality. Not visualized. No right lower quadrant inflammation or free fluid. VASCULAR/LYMPHATIC: There is calcific atherosclerosis of the abdominal aorta. No abdominal or pelvic lymphadenopathy. REPRODUCTIVE: Normal uterus and ovaries. MUSCULOSKELETAL. No bony spinal canal stenosis or focal osseous abnormality. OTHER: Anasarca. IMPRESSION: 1. No acute abnormality of the abdomen or pelvis. 2. Cardiomegaly and calcific aortic atherosclerosis (ICD10-I70.0). 3. Diffuse anasarca. Electronically Signed   By: Ulyses Jarred M.D.   On: 03/08/2019 02:02   CT HEAD WO CONTRAST  Result Date: 03/24/2019 CLINICAL DATA:  Fall on heparin. EXAM: CT HEAD WITHOUT CONTRAST TECHNIQUE: Contiguous axial images were obtained from the base of the skull through the vertex without intravenous contrast. COMPARISON:  03/13/2012 FINDINGS: Brain: No evidence of acute infarction, hemorrhage, hydrocephalus, extra-axial collection or mass lesion/mass effect. Vascular:  No hyperdense vessel or unexpected calcification. Skull: Normal. Negative for fracture or focal lesion. Sinuses/Orbits: No acute finding. IMPRESSION: Negative head CT. Electronically Signed   By: Monte Fantasia M.D.   On: 03/24/2019 04:58   CT CHEST WO CONTRAST  Result Date: 03/19/2019 CLINICAL DATA:  Hypoxemia and dyspnea. EXAM: CT CHEST WITHOUT CONTRAST TECHNIQUE: Multidetector CT imaging of the chest was performed following the standard protocol without IV contrast. COMPARISON:  Chest radiograph from one day prior. FINDINGS: Cardiovascular: Moderate cardiomegaly. No significant pericardial effusion/thickening. Left anterior descending coronary atherosclerosis. 3 lead left subclavian ICD is noted with lead tips in coronary sinus and right ventricle. Right internal jugular central venous catheter terminates at the cavoatrial junction. Atherosclerotic thoracic aorta with ectatic 4.4 cm ascending thoracic aorta. Dilated main pulmonary artery (3.8 cm diameter). Mediastinum/Nodes: No discrete thyroid nodules. Unremarkable esophagus. No axillary adenopathy. Enlarged 1.4 cm left prevascular node (series 3/image 43). No additional pathologically enlarged mediastinal nodes. No discrete hilar adenopathy on these noncontrast images. Lungs/Pleura: No pneumothorax. No pleural effusion. Mild platelike scarring versus atelectasis in posterior left lower lobe. No acute consolidative airspace disease, lung masses or significant pulmonary nodules. Upper abdomen: Finely irregular liver surface, cannot exclude cirrhosis. Simple exophytic 1.5 cm upper left renal cyst. Musculoskeletal: No aggressive appearing focal osseous lesions. Moderate thoracic spondylosis. IMPRESSION: 1. Moderate cardiomegaly.  One vessel coronary atherosclerosis. 2. Dilated main pulmonary artery, suggesting pulmonary arterial hypertension. 3. Mild platelike scarring versus atelectasis at the left lung base. Otherwise no active pulmonary disease. No pleural  effusions. 4. Nonspecific mild left prevascular mediastinal lymphadenopathy. 5. Ectatic 4.4 cm ascending thoracic aorta. Recommend annual imaging followup by CTA or MRA. This recommendation follows 2010 ACCF/AHA/AATS/ACR/ASA/SCA/SCAI/SIR/STS/SVM Guidelines for the Diagnosis and Management of Patients with Thoracic Aortic Disease. Circulation. 2010; 121: B846-K599. Aortic aneurysm NOS (ICD10-I71.9). 6. Finely irregular liver surface, cannot exclude cirrhosis. Aortic Atherosclerosis (ICD10-I70.0). Electronically Signed   By: Ilona Sorrel M.D.   On: 03/19/2019 09:05   CARDIAC CATHETERIZATION  Result Date: 03/09/2019 1. Right > left heart  failure with low PAPI. 2. Pulmonary venous hypertension. 3. Adequate cardiac output on dobutamine 2.5 mcg/kg/min  CT FEMUR RIGHT WO CONTRAST  Result Date: 03/08/2019 CLINICAL DATA:  Upper leg trauma, right thigh swelling EXAM: CT OF THE LOWER RIGHT EXTREMITY WITHOUT CONTRAST TECHNIQUE: Multidetector CT imaging of the right lower extremity was performed according to the standard protocol. COMPARISON:  None. FINDINGS: Bones/Joint/Cartilage No fracture or dislocation. There is moderate right hip osteoarthritis with superior joint space loss and marginal osteophyte formation. No periosteal reaction or cortical destruction. No large knee or hip joint effusion is seen. Ligaments Suboptimally assessed by CT. Muscles and Tendons The enthesopathy seen at the hamstrings insertion site. The remainder of the visualized portion of the tendons appear to be intact. The muscles surrounding the thigh appear to be intact. Soft tissues There is diffuse skin thickening and subcutaneous edema seen surrounding the right lower extremity. Overlying the greater trochanter adjacent to the deep fascial layer there is thin linear area of fluid. The fluid does not appear to be encapsulated however. Dense vascular calcifications seen within the deep pelvis. IMPRESSION: 1. No acute osseous abnormality or  soft tissue hematoma. 2. Thin non loculated fluid seen adjacent to the deep fascial layers/iliotibial tibial tract overlying the greater trochanter which could represent a posttraumatic seroma/Mild Sherry Ruffing lesion. 3. Diffuse subcutaneous edema and skin thickening surrounding the right lower extremity. Electronically Signed   By: Prudencio Pair M.D.   On: 03/08/2019 02:09   IR Fluoro Guide CV Line Right  Result Date: 03/12/2019 INDICATION: 59 year old female with acute kidney injury in need of hemodialysis. She presents for placement of a temporary hemodialysis catheter. EXAM: IR ULTRASOUND GUIDANCE VASC ACCESS RIGHT; IR RIGHT FLUORO GUIDE CV LINE MEDICATIONS: None. ANESTHESIA/SEDATION: None. FLUOROSCOPY TIME:  Fluoroscopy Time: 0 minutes 30 seconds (2.5 mGy). COMPLICATIONS: None immediate. PROCEDURE: Informed written consent was obtained from the patient after a thorough discussion of the procedural risks, benefits and alternatives. All questions were addressed. Maximal Sterile Barrier Technique was utilized including caps, mask, sterile gowns, sterile gloves, sterile drape, hand hygiene and skin antiseptic. A timeout was performed prior to the initiation of the procedure. The right internal jugular vein was interrogated with ultrasound and found to be widely patent. An image was obtained and stored for the medical record. Local anesthesia was attained by infiltration with 1% lidocaine. A small dermatotomy was made. Under real-time sonographic guidance, the vessel was punctured with a 21 gauge micropuncture needle. Using standard technique, the initial micro needle was exchanged over a 0.018 micro wire for a transitional 4 [place] micro sheath. The micro sheath was then exchanged over a 0.035 wire for a fascial dilator. The soft tissue tract was then dilated. A 16 cm triple-lumen temporary hemodialysis catheter was then advanced over the wire and positioned with the catheter tip in the upper right atrium.  Catheter flushes and aspirates easily. The catheter was flushed with heparinized saline, capped and secured to the skin with 0 Prolene suture. A sterile bandage was applied. IMPRESSION: Successful placement of a right IJ approach 16 cm non tunneled triple-lumen hemodialysis catheter. The catheter tips are in the upper right atrium and the catheter is ready for immediate use. Signed, Criselda Peaches, MD, [place] [facility] Radiology Electronically Signed   By: Jacqulynn Cadet M.D.   On: 03/12/2019 16:20   IR US Guide Vasc Access Right  Result Date: 03/12/2019 INDICATION: 59 year old female with acute kidney injury in need of hemodialysis. She presents  for placement of a temporary hemodialysis catheter. EXAM: IR ULTRASOUND GUIDANCE VASC ACCESS RIGHT; IR RIGHT FLUORO GUIDE CV LINE MEDICATIONS: None. ANESTHESIA/SEDATION: None. FLUOROSCOPY TIME:  Fluoroscopy Time: 0 minutes 30 seconds (2.5 mGy). COMPLICATIONS: None immediate. PROCEDURE: Informed written consent was obtained from the patient after a thorough discussion of the procedural risks, benefits and alternatives. All questions were addressed. Maximal Sterile Barrier Technique was utilized including caps, mask, sterile gowns, sterile gloves, sterile drape, hand hygiene and skin antiseptic. A timeout was performed prior to the initiation of the procedure. The right internal jugular vein was interrogated with ultrasound and found to be widely patent. An image was obtained and stored for the medical record. Local anesthesia was attained by infiltration with 1% lidocaine. A small dermatotomy was made. Under real-time sonographic guidance, the vessel was punctured with a 21 gauge micropuncture needle. Using standard technique, the initial micro needle was exchanged over a 0.018 micro wire for a transitional 4 [place] micro sheath. The micro sheath was then exchanged over a 0.035 wire for a fascial dilator. The soft  tissue tract was then dilated. A 16 cm triple-lumen temporary hemodialysis catheter was then advanced over the wire and positioned with the catheter tip in the upper right atrium. Catheter flushes and aspirates easily. The catheter was flushed with heparinized saline, capped and secured to the skin with 0 Prolene suture. A sterile bandage was applied. IMPRESSION: Successful placement of a right IJ approach 16 cm non tunneled triple-lumen hemodialysis catheter. The catheter tips are in the upper right atrium and the catheter is ready for immediate use. Signed, Criselda Peaches, MD, [place] [facility] Radiology Electronically Signed   By: Jacqulynn Cadet M.D.   On: 03/12/2019 16:20   DG CHEST PORT 1 VIEW  Result Date: 03/18/2019 CLINICAL DATA:  Shortness of breath EXAM: PORTABLE CHEST 1 VIEW COMPARISON:  03/17/2019 FINDINGS: Right-sided IJ approach central venous catheter with distal tip terminating at the level of the superior cavoatrial junction. Stable positioning of left-sided implanted cardiac device. Stable cardiomegaly. Calcified thoracic aorta. No new focal airspace consolidation, pleural effusion, or pneumothorax. IMPRESSION: Stable exam.  No active disease. Electronically Signed   By: Davina Poke D.O.   On: 03/18/2019 10:19   DG CHEST PORT 1 VIEW  Result Date: 03/17/2019 CLINICAL DATA:  CHF EXAM: PORTABLE CHEST 1 VIEW COMPARISON:  03/07/2019 FINDINGS: Cardiac enlargement with AICD. Negative for heart failure. Atherosclerotic aortic arch. Lungs are clear without infiltrate or effusion. IMPRESSION: No active disease. Electronically Signed   By: Franchot Gallo M.D.   On: 03/17/2019 10:58   DG Chest Portable 1 View  Result Date: 03/07/2019 CLINICAL DATA:  Shortness of breath, weakness EXAM: PORTABLE CHEST 1 VIEW COMPARISON:  06/25/2017 FINDINGS: Left AICD remains in place, unchanged. Cardiomegaly. No confluent opacities or effusions. No edema.  No acute bony abnormality. IMPRESSION: Cardiomegaly.  No acute cardiopulmonary disease. Electronically Signed   By: Rolm Baptise M.D.   On: 03/07/2019 21:04   DG Bone Survey Met  Result Date: 03/03/2019 CLINICAL DATA:  Plasma cell disorder. EXAM: METASTATIC BONE SURVEY COMPARISON:  February 10, 2019. FINDINGS: A lateral radiograph of the skull demonstrates no significant lytic lesions. There is no acute osseous abnormality. Evaluation of the right shoulder demonstrates no significant abnormality. There is incidentally noted a right-sided cervical rib. Evaluation of the left shoulder demonstrates no significant abnormality. A left-sided pacemaker is noted. Aortic calcifications are noted. Evaluation of the left humerus demonstrates no abnormality. Evaluation of  the right humerus demonstrates no abnormality. Evaluation of the right forearm demonstrates no lytic lesion. Vascular calcifications are noted. Evaluation of the left forearm demonstrates vascular calcifications without evidence for lytic lesion. Evaluation of the cervical spine demonstrates no acute abnormality. There is a right-sided cervical rib. Mild degenerative changes are noted of the cervical spine without evidence for an acute displaced fracture or prevertebral soft tissue swelling. Evaluation of the thoracic spine is limited by overlapping osseous structures and suboptimal patient positioning. There is no definite acute osseous abnormality however the midthoracic spine is poorly evaluated. Evaluation lumbar spine demonstrates no acute abnormality. There are extensive phleboliths projecting over the patient's pelvis. Mild degenerative changes are noted. There are aortic calcifications. The heart size is significantly enlarged without evidence for pleural effusion or pneumothorax. There is no focal infiltrate. Evaluation of the pelvis demonstrates no lytic lesion. There are mild degenerative changes of both hips. Evaluation of the right femur  demonstrates no acute abnormality. There are no lytic lesions. Varicose veins are suspected. Evaluation of the left femur demonstrates no significant abnormality. Varicose veins are suspected. Evaluation of the right tibia and fibula demonstrates no lytic lesion or fracture. There is nonspecific soft tissue swelling about the ankle. Evaluation of the left tibia/fibula demonstrates no acute abnormality or lytic lesion. There is nonspecific soft tissue swelling of the lower extremity. IMPRESSION: 1. No significant lytic lesions are identified. 2. Mild degenerative changes of the cervical, thoracic, and lumbar spine. 3. Nonspecific soft tissue swelling of the lower extremities. 4. Aortic calcifications are noted. 5. Right-sided cervical rib. 6. Findings suspicious for bilateral lower extremity varicose veins. Electronically Signed   By: Constance Holster M.D.   On: 03/03/2019 16:33   ECHOCARDIOGRAM COMPLETE  Result Date: 03/09/2019   ECHOCARDIOGRAM REPORT   Patient Name:   DARRELL LEONHARDT Date of Exam: 03/09/2019 Medical Rec #:  [id number]       Height:       66.0 in Accession #:    [id number]      Weight:       198.4 lb Date of Birth:  1960/06/05        BSA:          1.99 m Patient Age:    30 years        BP:           99/67 mmHg Patient Gender: F               HR:           60 bpm. Exam Location:  Inpatient Procedure: 2D Echo and Intracardiac Opacification Agent Indications:    CHF-Acute Systolic 938.18 / E99.37  History:        Patient has prior history of Echocardiogram examinations, most                 recent 04/10/2017. CHF, Biventricular ICD, Stroke;                 Arrythmias:Atrial Fibrillation. Chronic kidney disease.                 Congenital heart block. GERD.  Sonographer:    [name] RDCS Referring Phys: [place]  1. Left ventricular ejection fraction, by visual estimation, is <20%. The left ventricle has severely decreased function. There is no left ventricular  hypertrophy.  2. Definity contrast agent was given IV to delineate the left ventricular endocardial borders.  3. Abnormal septal motion consistent with  left bundle branch block.  4. Left ventricular diastolic parameters are consistent with Grade III diastolic dysfunction (restrictive).  5. Moderately dilated left ventricular internal cavity size.  6. The left ventricle demonstrates global hypokinesis.  7. Global right ventricle has mildly reduced systolic function.The right ventricular size is severely enlarged. No increase in right ventricular wall thickness.  8. Left atrial size was severely dilated.  9. Right atrial size was severely dilated. 10. Trivial pericardial effusion is present. 11. The mitral valve is normal in structure. No evidence of mitral valve regurgitation. 12. The tricuspid valve is normal in structure. 13. The aortic valve is normal in structure. Aortic valve regurgitation is not visualized. Mild aortic valve sclerosis without stenosis. 14. Pulmonic regurgitation is moderate. 15. The pulmonic valve was normal in structure. Pulmonic valve regurgitation is moderate. 16. Mildly dilated pulmonary artery. 17. Mildly elevated pulmonary artery systolic pressure. 18. The tricuspid regurgitant velocity is 2.32 m/s, and with an assumed right atrial pressure of 15 mmHg, the estimated right ventricular systolic pressure is mildly elevated at 36.6 mmHg. 19. A pacer wire is visualized. 20. The inferior vena cava is dilated in size with <50% respiratory variability, suggesting right atrial pressure of 15 mmHg. 21. No intracardiac thrombi or masses were visualized. 22. No significant change from prior study (April 10, 2017). 23. Prior images reviewed side by side. FINDINGS  Left Ventricle: Left ventricular ejection fraction, by visual estimation, is <20%. The left ventricle has severely decreased function. Definity contrast agent was given IV to delineate the left ventricular endocardial borders. The left  ventricle demonstrates global hypokinesis. The left ventricular internal cavity size was moderately dilated left ventricle. There is no left ventricular hypertrophy. Abnormal (paradoxical) septal motion, consistent with left bundle branch block. The left ventricular diastology could not be evaluated due to atrial fibrillation. Left ventricular diastolic parameters are consistent with Grade III diastolic dysfunction (restrictive). Right Ventricle: The right ventricular size is severely enlarged. No increase in right ventricular wall thickness. Global RV systolic function is has mildly reduced systolic function. The tricuspid regurgitant velocity is 2.32 m/s, and with an assumed right atrial pressure of 15 mmHg, the estimated right ventricular systolic pressure is mildly elevated at 36.6 mmHg. Left Atrium: Left atrial size was severely dilated. Right Atrium: Right atrial size was severely dilated Pericardium: Trivial pericardial effusion is present. Mitral Valve: The mitral valve is normal in structure. No evidence of mitral valve regurgitation, with centrally-directed jet. Tricuspid Valve: The tricuspid valve is normal in structure. Tricuspid valve regurgitation is severe. Aortic Valve: The aortic valve is normal in structure. Aortic valve regurgitation is not visualized. Mild aortic valve sclerosis is present, with no evidence of aortic valve stenosis. Pulmonic Valve: The pulmonic valve was normal in structure. Pulmonic valve regurgitation is moderate. Pulmonic regurgitation is moderate. Aorta: The aortic root and ascending aorta are structurally normal, with no evidence of dilitation. Pulmonary Artery: The pulmonary artery is mildly dilated. Venous: The inferior vena cava is dilated in size with less than 50% respiratory variability, suggesting right atrial pressure of 15 mmHg. IAS/Shunts: No atrial level shunt detected by color flow Doppler. Additional Comments: No intracardiac thrombi or masses were visualized. A  pacer wire is visualized.  LEFT VENTRICLE PLAX 2D LVIDd:         6.10 cm       Diastology LVIDs:         5.20 cm       LV e' lateral:   5.76 cm/s LV PW:  1.00 cm       LV E/e' lateral: 18.0 LV IVS:        1.00 cm       LV e' medial:    4.95 cm/s LVOT diam:     1.80 cm       LV E/e' medial:  21.0 LV SV:         57 ml LV SV Index:   27.63 LVOT Area:     2.54 cm  LV Volumes (MOD) LV area d, A2C:    40.10 cm LV area d, A4C:    46.10 cm LV area s, A2C:    36.70 cm LV area s, A4C:    40.70 cm LV major d, A2C:   8.43 cm LV major d, A4C:   8.80 cm LV major s, A2C:   8.23 cm LV major s, A4C:   8.19 cm LV vol d, MOD A2C: 159.0 ml LV vol d, MOD A4C: 201.0 ml LV vol s, MOD A2C: 139.0 ml LV vol s, MOD A4C: 162.0 ml LV SV MOD A2C:     20.0 ml LV SV MOD A4C:     201.0 ml LV SV MOD BP:      32.6 ml RIGHT VENTRICLE RV Basal diam:  5.61 cm RV S prime:     11.40 cm/s TAPSE (M-mode): 1.8 cm LEFT ATRIUM              Index       RIGHT ATRIUM           Index LA diam:        4.40 cm  2.21 cm/m  RA Area:     32.60 cm LA Vol (A2C):   168.0 ml 84.29 ml/m RA Volume:   128.00 ml 64.22 ml/m LA Vol (A4C):   161.0 ml 80.78 ml/m LA Biplane Vol: 174.0 ml 87.30 ml/m  AORTIC VALVE LVOT Vmax:   120.00 cm/s LVOT Vmean:  69.500 cm/s LVOT VTI:    0.170 m  AORTA Ao Root diam: 2.40 cm Ao Asc diam:  3.30 cm MITRAL VALVE                TRICUSPID VALVE MV Area (PHT): 5.97 cm     TR Peak grad:   21.6 mmHg MV PHT:        36.83 msec   TR Vmax:        286.00 cm/s MV Decel Time: 127 msec MV E velocity: 104.00 cm/s  SHUNTS                             Systemic VTI:  0.17 m                             Systemic Diam: 1.80 cm  Dani Gobble Croitoru MD Electronically signed by Sanda Klein MD Signature Date/Time: 03/09/2019/3:16:38 PM    Final    VAS [place] LOWER EXTREMITY VENOUS (DVT)  Result Date: 03/20/2019  Lower Venous Study Indications: Pain, and Edema. Other Indications: Acute respiratory failure and acute pulmonary edema. Limitations: Body habitus  and Patient's inability to tolerate full compression of the vein. Comparison Study: Negative right lower ext. venous study on 03/13/2019. Performing Technologist: Oda Cogan RDMS, RVT  Examination Guidelines: A complete evaluation includes B-mode imaging, spectral Doppler, color Doppler, and power Doppler as needed of all accessible portions of each vessel. Bilateral testing  is considered an integral part of a complete examination. Limited examinations for reoccurring indications may be performed as noted.  +---------+---------------+---------+-----------+----------+-------------------+ RIGHT    CompressibilityPhasicitySpontaneityPropertiesThrombus Aging      +---------+---------------+---------+-----------+----------+-------------------+ CFV      Full           Yes      Yes                                      +---------+---------------+---------+-----------+----------+-------------------+ SFJ      Full                                                             +---------+---------------+---------+-----------+----------+-------------------+ FV Prox  Full                                                             +---------+---------------+---------+-----------+----------+-------------------+ FV Mid                  Yes      Yes                  patient unable to                                                         tolerate                                                                  compression         +---------+---------------+---------+-----------+----------+-------------------+ FV Distal               Yes      Yes                  patient unable to                                                         tolerate                                                                  compression         +---------+---------------+---------+-----------+----------+-------------------+ POP      Full           Yes  Yes                                       +---------+---------------+---------+-----------+----------+-------------------+ PTV      Full                                                             +---------+---------------+---------+-----------+----------+-------------------+ PERO     Full                                                             +---------+---------------+---------+-----------+----------+-------------------+ Patent femoral vein by color Doppler imaging.  +---------+---------------+---------+-----------+----------+-------------------+ LEFT     CompressibilityPhasicitySpontaneityPropertiesThrombus Aging      +---------+---------------+---------+-----------+----------+-------------------+ CFV      Full                                                             +---------+---------------+---------+-----------+----------+-------------------+ SFJ      Full                                                             +---------+---------------+---------+-----------+----------+-------------------+ FV Prox  Full                                                             +---------+---------------+---------+-----------+----------+-------------------+ FV Mid                  Yes      Yes                  patient unable to                                                         tolerate                                                                  compression         +---------+---------------+---------+-----------+----------+-------------------+ FV Distal               Yes      Yes  patient unable to                                                         tolerate                                                                  compression         +---------+---------------+---------+-----------+----------+-------------------+ PFV      Full                                                              +---------+---------------+---------+-----------+----------+-------------------+ POP      Full           Yes      Yes                                      +---------+---------------+---------+-----------+----------+-------------------+ PTV      Full                                                             +---------+---------------+---------+-----------+----------+-------------------+ PERO     Full                                                             +---------+---------------+---------+-----------+----------+-------------------+ Patent femoral vein by color Doppler imaging.   Summary: Right: There is no evidence of obvious deep vein thrombosis in the lower extremity. No cystic structure found in the popliteal fossa. Left: There is no evidence of obvious deep vein thrombosis in the lower extremity. No cystic structure found in the popliteal fossa.  *See table(s) above for measurements and observations. Electronically signed by Ruta Hinds MD on 03/20/2019 at 9:43:27 AM.    Final    VAS [place] LOWER EXTREMITY VENOUS (DVT)  Result Date: 03/13/2019  Lower Venous Study Indications: Pain.  Limitations: Pain tolerance. Comparison Study: no prior Performing Technologist: Abram Sander RVS  Examination Guidelines: A complete evaluation includes B-mode imaging, spectral Doppler, color Doppler, and power Doppler as needed of all accessible portions of each vessel. Bilateral testing is considered an integral part of a complete examination. Limited examinations for reoccurring indications may be performed as noted.  +---------+---------------+---------+-----------+----------+-------------------+ RIGHT    CompressibilityPhasicitySpontaneityPropertiesThrombus Aging      +---------+---------------+---------+-----------+----------+-------------------+ CFV      Full           Yes      Yes                                       +---------+---------------+---------+-----------+----------+-------------------+  SFJ      Full                                                             +---------+---------------+---------+-----------+----------+-------------------+ FV Prox  Full                                                             +---------+---------------+---------+-----------+----------+-------------------+ FV Mid                  Yes      Yes                  unable to tolerate                                                        compression         +---------+---------------+---------+-----------+----------+-------------------+ FV Distal               Yes      Yes                  unable to tolerate                                                        compression         +---------+---------------+---------+-----------+----------+-------------------+ PFV      Full                                                             +---------+---------------+---------+-----------+----------+-------------------+ POP      Full           Yes      Yes                                      +---------+---------------+---------+-----------+----------+-------------------+ PTV      Full                                                             +---------+---------------+---------+-----------+----------+-------------------+ PERO     Full                                                             +---------+---------------+---------+-----------+----------+-------------------+   +----+---------------+---------+-----------+----------+--------------+  LEFTCompressibilityPhasicitySpontaneityPropertiesThrombus Aging +----+---------------+---------+-----------+----------+--------------+ CFV Full           Yes                                          +----+---------------+---------+-----------+----------+--------------+     Summary: Right: There is no evidence of deep vein  thrombosis in the lower extremity. However, portions of this examination were limited- see technologist comments above. No cystic structure found in the popliteal fossa. Left: No evidence of common femoral vein obstruction.  *See table(s) above for measurements and observations. Electronically signed by Monica Martinez MD on 03/13/2019 at 1:49:39 PM.    Final    [place] EKG SITE RITE  Result Date: 03/08/2019 If Site Rite image not attached, placement could not be confirmed due to current cardiac rhythm.   Labs:  CBC: Recent Labs    03/24/19 0230 03/25/19 0439 03/25/19 1219 03/27/19 0549  WBC 14.1* 12.5* 11.9* 11.0*  HGB 11.0* 9.6* 10.1* 9.2*  HCT 32.0* 28.7* 29.6* 27.6*  PLT 199 149* 165 182    COAGS: Recent Labs    11/30/18 0610 12/03/18 1219 03/24/19 0230 03/25/19 0439 03/26/19 0459 03/27/19 0549  INR 4.1*   < > 1.7* 1.6* 1.5* 1.5*  APTT 44*  --   --   --   --   --    < > = values in this interval not displayed.    BMP: Recent Labs    03/25/19 0439 03/25/19 1219 03/26/19 0459 03/27/19 0549  NA 132* 132* 129* 124*  K 4.0 4.2 4.7 4.3  CL 90* 95* 90* 83*  CO2 24 24 22 26   GLUCOSE 352* 273* 215* 386*  BUN 36* 42* 61* 38*  CALCIUM 8.7* 8.4* 8.7* 8.4*  CREATININE 3.17* 3.65* 4.88* 3.73*  GFRNONAA 15* 13* 9* 13*  GFRAA 18* 15* 11* 15*    LIVER FUNCTION TESTS: Recent Labs    09/23/18 1339 09/23/18 1339 01/18/19 1202 01/18/19 1202 03/07/19 2101 03/07/19 2101 03/08/19 0927 03/10/19 0806 03/25/19 0439 03/25/19 1219 03/26/19 0459 03/27/19 0549  BILITOT 1.6*  --  2.3*  --  3.0*  --  3.3*  --   --   --   --   --   AST 24  --  24  --  23  --  24  --   --   --   --   --   ALT 19  --  18  --  17  --  16  --   --   --   --   --   ALKPHOS 218*  --  256*  --  324*  --  303*  --   --   --   --   --   PROT 7.2  --  7.1  --  7.7  --  7.3  --   --   --   --   --   ALBUMIN 3.8   < > 3.5   < > 2.9*   < > 2.7*   < > 2.1* 2.2* 2.0* 1.9*   < > = values in this interval  not displayed.    TUMOR MARKERS: No results for input(s): AFPTM, CEA, CA199, CHROMGRNA in the last 8760 hours.  Assessment and Plan:  Acute on chronic renal failure CRRT 1/21-26 Need for ongoing dialysis Scheduled for placement of tunneled dialysis catheter Plan 1/31  if can-in IR Risks and benefits discussed with the patient's husband at bedside including, but not limited to bleeding, infection, vascular injury, pneumothorax which may require chest tube placement, air embolism or even death  All questions were answered, husband is agreeable to proceed. Consent signed and in chart.   Thank you for this interesting consult.  I greatly enjoyed meeting [name] and look forward to participating in their care.  A copy of this report was sent to the requesting provider on this date.  Electronically Signed: Lavonia Drafts, PA-C 03/27/2019, 12:48 PM   I spent a total of 20 Minutes    in face to face in clinical consultation, greater than 50% of which was counseling/coordinating care for tunneled dialysis catheter placement

## 2019-03-27 NOTE — Progress Notes (Signed)
[facility] ROUNDING NOTE   Subjective:   Is a 59 year old lady that was admitted 03/08/2019 with a history of chronic atrial fibrillation complete heart block status post pacemaker placement chronic systolic congestive heart failure with ejection fraction of 15 to 20% liver cirrhosis admitted with increasing swelling of legs and abdomen and a 30 pound weight gain.  She was found to have acute on chronic biventricular heart failure.  She was treated with CRRT 03/18/2019-03/23/2019.  Marland Kitchen  There is concern for cardiac amyloid this is being evaluated by cardiology bone marrow 03/11/2018 showed 14% plasma cells consistent with multiple myeloma  but LV morphology did not appear consistent with AL amyloid.  She is no longer on warfarin due to concern for warfarin skin necrosis.  Continues on IV heparin.  Blood pressure 99/52 pulse 59 temperature 98.1 O2 sats 100% room air  Sodium 124 potassium 4.3 chloride 83 CO2 26 BUN 38 creatinine 3.73 glucose 386 calcium 8.4 albumin 1.9 WBC 11 hemoglobin 9.2 platelets 182  No urine output  Allopurinol 150 mg daily, Neurontin 300 mg twice daily insulin sliding scale, glargine 5 units, midodrine 15 mg 3 times daily.   Objective:  Vital signs in last 24 hours:  Temp:  [97.7 F (36.5 C)-98.5 F (36.9 C)] 98.1 F (36.7 C) (01/30 0750) Pulse Rate:  [52-60] 58 (01/30 0750) Resp:  [15-21] 21 (01/30 0750) BP: (93-109)/(41-75) 101/57 (01/30 0427) SpO2:  [76 %-100 %] 100 % (01/30 0750) Weight:  [56.2 kg-67.7 kg] 56.2 kg (01/30 0427)  Weight change:  Filed Weights   03/23/19 0356 03/26/19 1700 03/27/19 0427  Weight: 67.9 kg 67.7 kg 56.2 kg    Intake/Output: I/O last 3 completed shifts: In: 255 [P.O.:255] Out: -    Intake/Output this shift:  No intake/output data recorded. Alert nondistressed CVS- RRR JVP to jaw right IJ tunneled dialysis catheter 2/6 systolic murmur RS-clear to auscultation ABD- BS present soft non-distended EXT-trace  edema   Basic Metabolic Panel: Recent Labs  Lab 03/21/19 0452 03/21/19 1603 03/22/19 0500 03/22/19 1700 03/23/19 0405 03/23/19 0405 03/24/19 0240 03/24/19 0240 03/25/19 0439 03/25/19 0439 03/25/19 1219 03/26/19 0459 03/27/19 0549  NA 130*   < > 129*   < > 127*   < > 125*  --  132*  --  132* 129* 124*  K 4.9   < > 5.0   < > 5.0   < > 5.8*  --  4.0  --  4.2 4.7 4.3  CL 94*   < > 93*   < > 94*   < > 92*  --  90*  --  95* 90* 83*  CO2 21*   < > 20*   < > 21*   < > 19*  --  24  --  _0 GLUCOSE 174*   < > 194*   < > 218*   < > 263*  --  352*  --  273* 215* 386*  BUN 25*   < > 25*   < > 29*   < > 60*  --  36*  --  42* 61* 38*  CREATININE 1.70*   < > 1.85*   < > 1.94*   < > 4.08*  --  3.17*  --  3.65* 4.88* 3.73*  CALCIUM 9.1   < > 9.1   < > 8.9   < > 9.1   < > 8.7*   < > 8.4* 8.7* 8.4*  MG 2.6*  --  2.9*  --  2.8*  --   --   --   --   --   --   --   --   PHOS 2.7   < > 2.9   < > 2.9   < > 4.3  --  4.7*  --  4.7* 5.8* 4.6   < > = values in this interval not displayed.    Liver Function Tests: Recent Labs  Lab 03/24/19 0240 03/25/19 0439 03/25/19 1219 03/26/19 0459 03/27/19 0549  ALBUMIN 2.6* 2.1* 2.2* 2.0* 1.9*   No results for input(s): LIPASE, AMYLASE in the last 168 hours. No results for input(s): AMMONIA in the last 168 hours.  CBC: Recent Labs  Lab 03/23/19 0350 03/24/19 0230 03/25/19 0439 03/25/19 1219 03/27/19 0549  WBC 15.5* 14.1* 12.5* 11.9* 11.0*  HGB 11.6* 11.0* 9.6* 10.1* 9.2*  HCT 34.8* 32.0* 28.7* 29.6* 27.6*  MCV 83.5 81.8 82.7 81.5 82.1  PLT 227 199 149* 165 182    Cardiac Enzymes: No results for input(s): CKTOTAL, CKMB, CKMBINDEX, TROPONINI in the last 168 hours.  BNP: Invalid input(s): POCBNP  CBG: Recent Labs  Lab 03/25/19 1640 03/25/19 2109 03/26/19 1342 03/26/19 1640 03/26/19 2201  GLUCAP 279* 245* 121* 251* 360*    Microbiology: Results for orders placed or performed during the hospital encounter of 03/07/19   Respiratory Panel by RT PCR (Flu A&B, Covid) - Nasopharyngeal Swab     Status: None   Collection Time: 03/07/19  9:21 PM   Specimen: Nasopharyngeal Swab  Result Value Ref Range Status   SARS Coronavirus 2 by RT PCR NEGATIVE NEGATIVE Final    Comment: (NOTE) SARS-CoV-2 target nucleic acids are NOT DETECTED. The SARS-CoV-2 RNA is generally detectable in upper respiratoy specimens during the acute phase of infection. The lowest concentration of SARS-CoV-2 viral copies this assay can detect is 131 copies/mL. A negative result does not preclude SARS-Cov-2 infection and should not be used as the sole basis for treatment or other patient management decisions. A negative result may occur with  improper specimen collection/handling, submission of specimen other than nasopharyngeal swab, presence of viral mutation(s) within the areas targeted by this assay, and inadequate number of viral copies (<131 copies/mL). A negative result must be combined with clinical observations, patient history, and epidemiological information. The expected result is Negative. Fact Sheet for Patients:  PinkCheek.be Fact Sheet for Healthcare Providers:  GravelBags.it This test is not yet ap proved or cleared by the [place] FDA and  has been authorized for detection and/or diagnosis of SARS-CoV-2 by FDA under an Emergency Use Authorization (EUA). This EUA will remain  in effect (meaning this test can be used) for the duration of the COVID-19 declaration under Section 564(b)(1) of the Act, 21 U.S.C. section 360bbb-3(b)(1), unless the authorization is terminated or revoked sooner.    Influenza A by PCR NEGATIVE NEGATIVE Final   Influenza B by PCR NEGATIVE NEGATIVE Final    Comment: (NOTE) The Xpert Xpress SARS-CoV-2/FLU/RSV assay is intended as an aid in  the diagnosis of influenza from Nasopharyngeal swab specimens and  should not be used as a sole  basis for treatment. Nasal washings and  aspirates are unacceptable for Xpert Xpress SARS-CoV-2/FLU/RSV  testing. Fact Sheet for Patients: PinkCheek.be Fact Sheet for Healthcare Providers: GravelBags.it This test is not yet approved or cleared by the [place] FDA and  has been authorized for detection and/or diagnosis of SARS-CoV-2 by  FDA under an Emergency Use Authorization (EUA). This  EUA will remain  in effect (meaning this test can be used) for the duration of the  Covid-19 declaration under Section 564(b)(1) of the Act, 21  U.S.C. section 360bbb-3(b)(1), unless the authorization is  terminated or revoked. Performed at [facility], [address] [place], [place] [address]   MRSA PCR Screening     Status: None   Collection Time: 03/08/19 10:26 PM   Specimen: Nasopharyngeal  Result Value Ref Range Status   MRSA by PCR NEGATIVE NEGATIVE Final    Comment:        The GeneXpert MRSA Assay (FDA approved for NASAL specimens only), is one component of a comprehensive MRSA colonization surveillance program. It is not intended to diagnose MRSA infection nor to guide or monitor treatment for MRSA infections. Performed at [facility], [place] [address] [place], [place] [address]    *Note: Due to a large number of results and/or encounters for the requested time period, some results have not been displayed. A complete set of results can be found in Results Review.    Coagulation Studies: Recent Labs    03/25/19 0439 03/26/19 0459 03/27/19 0549  LABPROT 19.2* 17.8* 18.2*  INR 1.6* 1.5* 1.5*    Urinalysis: No results for input(s): COLORURINE, LABSPEC, PHURINE, GLUCOSEU, HGBUR, BILIRUBINUR, KETONESUR, PROTEINUR, UROBILINOGEN, NITRITE, LEUKOCYTESUR in the last 72 hours.  Invalid input(s): APPERANCEUR    Imaging: No results found.   Medications:   . sodium chloride 10 mL/hr at 03/14/19 1615  . heparin  1,350 Units/hr (03/27/19 0981)   . allopurinol  150 mg Oral q morning - 10a  . Chlorhexidine Gluconate Cloth  6 each Topical Q0600  . feeding supplement (NEPRO CARB STEADY)  237 mL Oral TID BM  . gabapentin  300 mg Oral BID  . insulin aspart  0-15 Units Subcutaneous TID WC  . insulin aspart  0-5 Units Subcutaneous QHS  . insulin glargine  5 Units Subcutaneous Daily  . lidocaine (PF)  5 mL Other Once  . midodrine  15 mg Oral TID WC  . multivitamin  1 tablet Oral QHS  . polyethylene glycol  17 g Oral BID  . senna  1 tablet Oral Daily  . sodium chloride flush  10-40 mL Intracatheter Q12H  . sodium chloride flush  3 mL Intravenous Q12H  . vitamin B-12  100 mcg Oral Daily   sodium chloride, acetaminophen **OR** acetaminophen, heparin, heparin, ondansetron **OR** ondansetron (ZOFRAN) IV, sodium chloride, sodium chloride flush, sodium chloride flush, traMADol  Assessment/ Plan:   Acute kidney injury on chronic kidney disease stage IV with a baseline creatinine about 3 mg/dL.  On admission 5 mg/dL thought to be secondary to cardiorenal syndrome.  Edema has significantly improved.  CRRT 03/18/2019-03/23/2019.  has been tolerating dialysis 03/26/2019 with 2 L removed.  It appears that hypotension may be limiting ability to effectively ultrafilter fluid.  We will continue to follow.  Patient is weak will probably need admission to the nursing facility.  I discussed with Dr. Marigene Ehlers.  At some point she will need placement of permanent access.  This will depend on if she manages to continue to tolerate dialysis.  Hypertension/volume continue volume removal.  CRRT since 03/18/2019-03/23/2019 t.  Increase midodrine to 15 mg 3 times a day in order to support blood pressure for dialysis.  Metabolic acidosis resolved  Hyperkalemia resolved with dialysis  Warfarin skin necrosis biopsy negative for calciphylaxis would continue off warfarin as form of anticoagulation  Chronic atrial fibrillation IV  heparin  Diabetes mellitus as per primary service  Multiple myeloma status post bone marrow biopsy consistent with this diagnosis  Palliative medicine consulted and appreciate help from Joseph Pierini.  Documentation in chart appears to suggest family wish to go home with comfort care per conversation with husband.  This will need to be clarified.    LOS: [place] _0 _1 :49 AM

## 2019-03-27 NOTE — Progress Notes (Signed)
Patient ID: [name], female   DOB: 06-07-1960, 59 y.o.   MRN: [id number] P    Advanced Heart Failure Rounding Note  PCP-Cardiologist: Kate Sable, MD   Subjective:    She has been tolerating HD reasonably well.  SBP 100s this morning on midodrine 15 tid.   She and her husband want to get home as soon as possible and do not want CIR or SNF placement.  She was able to walk more around the room yesterday with PT.   New diagnosis of multiple myeloma. Bx for possible calciphylaxis was negative (? false negative).   Echo: EF < 20%, no LVH, mildly reduced RV systolic function, severe biatrial enlargement, severe TR.  RHC Procedural Findings (dobutamine 2.5 mcg/kg/min): Hemodynamics (mmHg) RA 18 RV 43/18 PA 45/19, mean 28 PCWP mean 19 Oxygen saturations: PA 63% AO 94% Cardiac Output (Fick) 6.1  Cardiac Index (Fick) 3.06 PVR 1.5 WU PAPI 1.4  CT chest w/o contrast: Atelectasis, no significant pulmonary disease.    Objective:   Weight Range: 56.2 kg Body mass index is 20.01 kg/m.   Vital Signs:   Temp:  [97.7 F (36.5 C)-98.5 F (36.9 C)] 98.1 F (36.7 C) (01/30 0750) Pulse Rate:  [52-60] 58 (01/30 0750) Resp:  [15-21] 21 (01/30 0750) BP: (93-109)/(41-75) 101/57 (01/30 0427) SpO2:  [76 %-100 %] 100 % (01/30 0750) Weight:  [56.2 kg-67.7 kg] 56.2 kg (01/30 0427) Last BM Date: 03/25/19  Weight change: Filed Weights   03/23/19 0356 03/26/19 1700 03/27/19 0427  Weight: 67.9 kg 67.7 kg 56.2 kg    Intake/Output:  No intake or output data in the 24 hours ending 03/27/19 1104    Physical Exam   General: NAD Neck: JVP 10 cm, no thyromegaly or thyroid nodule.  Lungs: Clear to auscultation bilaterally with normal respiratory effort. CV: Nondisplaced PMI.  Heart regular S1/S2, no S3/S4, no murmur.  1+ ankle edema.   Abdomen: Soft, nontender, no hepatosplenomegaly, no distention.  Skin: Intact without lesions or rashes.  Neurologic: Alert and oriented x 3.    Psych: Normal affect. Extremities: No clubbing or cyanosis.  HEENT: Normal.    Telemetry   A fib BiV pacing 60 Personally reviewed    Labs    CBC Recent Labs    03/25/19 1219 03/27/19 0549  WBC 11.9* 11.0*  HGB 10.1* 9.2*  HCT 29.6* 27.6*  MCV 81.5 82.1  PLT 165 740   Basic Metabolic Panel Recent Labs    03/26/19 0459 03/27/19 0549  NA 129* 124*  K 4.7 4.3  CL 90* 83*  CO2 22 26  GLUCOSE 215* 386*  BUN 61* 38*  CREATININE 4.88* 3.73*  CALCIUM 8.7* 8.4*  PHOS 5.8* 4.6   Liver Function Tests Recent Labs    03/26/19 0459 03/27/19 0549  ALBUMIN 2.0* 1.9*   No results for input(s): LIPASE, AMYLASE in the last 72 hours. Cardiac Enzymes No results for input(s): CKTOTAL, CKMB, CKMBINDEX, TROPONINI in the last 72 hours.  BNP: BNP (last 3 results) Recent Labs    03/07/19 2101  BNP 1,072.0*    ProBNP (last 3 results) No results for input(s): PROBNP in the last 8760 hours.   D-Dimer No results for input(s): DDIMER in the last 72 hours. Hemoglobin A1C No results for input(s): HGBA1C in the last 72 hours. Fasting Lipid Panel No results for input(s): CHOL, HDL, LDLCALC, TRIG, CHOLHDL, LDLDIRECT in the last 72 hours. Thyroid Function Tests No results for input(s): TSH, T4TOTAL, T3FREE, THYROIDAB in  the last 72 hours.  Invalid input(s): FREET3  Other results:   Imaging    No results found.   Medications:     Scheduled Medications: . allopurinol  150 mg Oral q morning - 10a  . Chlorhexidine Gluconate Cloth  6 each Topical Q0600  . feeding supplement (NEPRO CARB STEADY)  237 mL Oral TID BM  . gabapentin  300 mg Oral BID  . insulin aspart  0-15 Units Subcutaneous TID WC  . insulin aspart  0-5 Units Subcutaneous QHS  . insulin glargine  5 Units Subcutaneous Daily  . lidocaine (PF)  5 mL Other Once  . midodrine  15 mg Oral TID WC  . multivitamin  1 tablet Oral QHS  . polyethylene glycol  17 g Oral BID  . senna  1 tablet Oral Daily  . sodium  chloride flush  10-40 mL Intracatheter Q12H  . sodium chloride flush  3 mL Intravenous Q12H  . vitamin B-12  100 mcg Oral Daily    Infusions: . sodium chloride 10 mL/hr at 03/14/19 1615  . heparin 1,350 Units/hr (03/27/19 0638)    PRN Medications: sodium chloride, acetaminophen **OR** acetaminophen, heparin, heparin, ondansetron **OR** ondansetron (ZOFRAN) IV, sodium chloride, sodium chloride flush, sodium chloride flush, traMADol    Assessment/Plan   1. Acute Hypoxic Respiratory Failure: Resolved, off oxygen. 2. 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  Last echo in 2/19 with EF 15-20%. She has a [employer] CRT-D device. Admitted with progressive dyspnea and 30 lb weight gain. Empirically started on dobutamine 2.5 due to concern for low output. RHC showed R>L heart failure with low PAPI adequate cardiac output on dobutamine. Now off dobutamine.  Initially started on iHD after she failed high dose diuretics, then CVVH with intractable volume overload.  Weight down 50 lbs from baseline, now off CVVH.  She will be HD dependent.   - Continue midodrine 15 mg tid with relative hypotension and need for iHD.  - HD MWF.  - Stopped digoxin and benazepril with AKI.  - Stopped beta blocker with concern for low output.  - No BP room for hydralazine/nitrates.  - Cardiac amyloidosis is a concern with myeloma, but echo myocardial appearance is not consistent. BM biopsy negative for amyloidosis.  - LE u/s negative for DVT 2. AKI on CKD stage 4: Baseline creatinine around 3, went up to 5 with BUN 150 this admission. Suspect cardiorenal syndrome superimposed on possible diabetic nephropathy. She will be dialysis-dependent going forward. - HD MWF  - Nephrology assistance appreciated. She will need permanent catheter access => consulted IR for tunneled catheter.   3. Chronic atrial fibrillation: Warfarin held for procedures, now on heparin gtt. Long-term, will transition to  apixaban with some concern for warfarin skin necrosis.    - Start apixaban after tunneled catheter placed.  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  5. DM: Per primary.  6. Monoclonal gammopathy:  LV morphology does not look consistent with AL amyloidosis. She has been seen by hematology, has had bone marrow bx showing 14% plasma cells by flow cytometry on the aspirate which is by definition multiple myeloma. Seen by heme/onc, plan for outpatient treatment.  BM bx negative for amyloidosis.  7. Tricuspid regurgitation: Severe by echo.  8.  Subcutaneous nodules: ?calciphylaxis versus less likely warfarin skin necrosis.  - Off warfarin, will use Eliquis in future.  - Bx negative for calciphylaxis. (? False negative)  - Renal managing for possible calciphylaxis.  9. Deconditioning: CIR  vs SNF recommended, she insists on going home.  Will continue to work on PT over the weekend, aim for home with HH/PT hopefully Monday after HD.   10. Poor prognosis.  Not sure she will tolerate iHD with relative hypotension.  Palliative care following, she is now a limited code.    Will try to get tunneled catheter placed soon. Hopefully can do HD on Monday then get home afterwards.    Length of Stay: Lewis, MD  03/27/2019, 11:04 AM  Advanced Heart Failure Team Pager [phone] (M-F; 7a - 4p)  Please contact [facility] for night-coverage after hours (4p -7a ) and weekends on [email or web address]

## 2019-03-28 ENCOUNTER — Inpatient Hospital Stay (HOSPITAL_COMMUNITY): Payer: Medicaid Other

## 2019-03-28 HISTORY — PX: IR FLUORO GUIDE CV LINE RIGHT: IMG2283

## 2019-03-28 LAB — CBC
HCT: 25.7 % — ABNORMAL LOW (ref 36.0–46.0)
Hemoglobin: 8.8 g/dL — ABNORMAL LOW (ref 12.0–15.0)
MCH: 27.7 pg (ref 26.0–34.0)
MCHC: 34.2 g/dL (ref 30.0–36.0)
MCV: 80.8 fL (ref 80.0–100.0)
Platelets: 204 10*3/uL (ref 150–400)
RBC: 3.18 MIL/uL — ABNORMAL LOW (ref 3.87–5.11)
RDW: 20.3 % — ABNORMAL HIGH (ref 11.5–15.5)
WBC: 11.7 10*3/uL — ABNORMAL HIGH (ref 4.0–10.5)
nRBC: 0.7 % — ABNORMAL HIGH (ref 0.0–0.2)

## 2019-03-28 LAB — RENAL FUNCTION PANEL
Albumin: 1.9 g/dL — ABNORMAL LOW (ref 3.5–5.0)
Anion gap: 17 — ABNORMAL HIGH (ref 5–15)
BUN: 59 mg/dL — ABNORMAL HIGH (ref 6–20)
CO2: 24 mmol/L (ref 22–32)
Calcium: 8.8 mg/dL — ABNORMAL LOW (ref 8.9–10.3)
Chloride: 82 mmol/L — ABNORMAL LOW (ref 98–111)
Creatinine, Ser: 5.51 mg/dL — ABNORMAL HIGH (ref 0.44–1.00)
GFR calc Af Amer: 9 mL/min — ABNORMAL LOW (ref >60)
GFR calc non Af Amer: 8 mL/min — ABNORMAL LOW (ref >60)
Glucose, Bld: 270 mg/dL — ABNORMAL HIGH (ref 70–99)
Phosphorus: 4.7 mg/dL — ABNORMAL HIGH (ref 2.5–4.6)
Potassium: 4.6 mmol/L (ref 3.5–5.1)
Sodium: 123 mmol/L — ABNORMAL LOW (ref 135–145)

## 2019-03-28 LAB — GLUCOSE, CAPILLARY
Glucose-Capillary: 233 mg/dL — ABNORMAL HIGH (ref 70–99)
Glucose-Capillary: 190 mg/dL — ABNORMAL HIGH (ref 70–99)
Glucose-Capillary: 165 mg/dL — ABNORMAL HIGH (ref 70–99)
Glucose-Capillary: 184 mg/dL — ABNORMAL HIGH (ref 70–99)

## 2019-03-28 LAB — PROTIME-INR
INR: 1.4 — ABNORMAL HIGH (ref 0.8–1.2)
Prothrombin Time: 17 seconds — ABNORMAL HIGH (ref 11.4–15.2)

## 2019-03-28 LAB — HEPARIN LEVEL (UNFRACTIONATED): Heparin Unfractionated: 0.2 IU/mL — ABNORMAL LOW (ref 0.30–0.70)

## 2019-03-28 MED ORDER — FENTANYL CITRATE (PF) 100 MCG/2ML IJ SOLN
INTRAMUSCULAR | Status: AC | PRN
  Administered 2019-03-28: 25 ug via INTRAVENOUS

## 2019-03-28 MED ORDER — MIDAZOLAM HCL 2 MG/2ML IJ SOLN
INTRAMUSCULAR | Status: AC
  Filled 2019-03-28: qty 2

## 2019-03-28 MED ORDER — FENTANYL CITRATE (PF) 100 MCG/2ML IJ SOLN
INTRAMUSCULAR | Status: AC
  Filled 2019-03-28: qty 2

## 2019-03-28 MED ORDER — LIDOCAINE HCL (PF) 1 % IJ SOLN
INTRAMUSCULAR | Status: AC | PRN
  Administered 2019-03-28: 10 mL

## 2019-03-28 MED ORDER — CHLORHEXIDINE GLUCONATE 4 % EX LIQD
CUTANEOUS | Status: AC
  Filled 2019-03-28: qty 15

## 2019-03-28 MED ORDER — VANCOMYCIN HCL IN DEXTROSE 1-5 GM/200ML-% IV SOLN
INTRAVENOUS | Status: AC
  Filled 2019-03-28: qty 200

## 2019-03-28 MED ORDER — APIXABAN 5 MG PO TABS
5.0000 mg | ORAL_TABLET | Freq: Two times a day (BID) | ORAL | Status: DC
  Administered 2019-03-28 – 2019-03-29 (×2): 5 mg via ORAL
  Filled 2019-03-28 (×2): qty 1

## 2019-03-28 MED ORDER — CHLORHEXIDINE GLUCONATE CLOTH 2 % EX PADS
6.0000 | MEDICATED_PAD | Freq: Every day | CUTANEOUS | Status: DC
  Administered 2019-03-28: 6 via TOPICAL

## 2019-03-28 MED ORDER — MIDAZOLAM HCL 2 MG/2ML IJ SOLN
INTRAMUSCULAR | Status: AC | PRN
  Administered 2019-03-28: 0.5 mg via INTRAVENOUS

## 2019-03-28 MED ORDER — HEPARIN SODIUM (PORCINE) 1000 UNIT/ML IJ SOLN
INTRAMUSCULAR | Status: AC
  Administered 2019-03-28: 10:00:00 3200 [IU]
  Filled 2019-03-28: qty 1

## 2019-03-28 MED ORDER — NALOXONE HCL 0.4 MG/ML IJ SOLN: 0.2000 mg | INTRAMUSCULAR | Status: DC | PRN

## 2019-03-28 MED ORDER — NALOXONE HCL 0.4 MG/ML IJ SOLN
INTRAMUSCULAR | Status: AC
  Administered 2019-03-28: 0.2 mg via INTRAVENOUS
  Filled 2019-03-28: qty 1

## 2019-03-28 MED ORDER — NALOXONE HCL 0.4 MG/ML IJ SOLN: 0.2000 mg | Freq: Once | INTRAMUSCULAR | Status: AC

## 2019-03-28 MED ORDER — LIDOCAINE HCL 1 % IJ SOLN
INTRAMUSCULAR | Status: AC
  Filled 2019-03-28: qty 20

## 2019-03-28 MED ORDER — CHLORHEXIDINE GLUCONATE 4 % EX LIQD
CUTANEOUS | Status: AC | PRN
  Administered 2019-03-28: 1 via TOPICAL

## 2019-03-28 NOTE — Progress Notes (Signed)
Patient returned from tunneled HD placement and remains lethargic. Rapid response and MD notified. When RR and MD arrive patient is more alert. 0.2 mg narcan given by rapid response RN.

## 2019-03-28 NOTE — Progress Notes (Signed)
Physical Therapy Treatment Patient Details Name: [name] MRN: [id number] DOB: 11/12/1960 Today's Date: 03/28/2019    History of Present Illness 59 year old with PMH significant for MGUS, thrombocytopenia, A. fib on Coumadin, CHB with pacemaker, nonischemic cardiomyopathy, CKD stage IV baseline creatinine 3.0, cirrhosis who presented with several weeks of progressive swelling about 30 pound weight gain. critically ill due to acute respiratory failure requiring BiPAP due to acute pulmonary edema due to acute decompensated HFrEF in context of stage V ESRD; new diagnosis of Multiple Myeloma.    PT Comments    Pt tolerated treatment well, initially lethargic but becoming more alert with mobility. Pt ambulates for household distances and much increased distance than previous session. Pt continues to require some physical assistance for all functional mobility due to generalized weakness and remains at a high falls risk. Pt will benefit from continued acute PT services to reduce falls risk and further increase activity tolerance.   Follow Up Recommendations  Home health PT;Supervision/Assistance - 24 hour(pt/family refusing inpatient PT services)     Equipment Recommendations  Rolling walker with 5" wheels;3in1 (PT)    Recommendations for Other Services       Precautions / Restrictions Precautions Precautions: Fall Precaution Comments: myoclonus, knees buckle Restrictions Weight Bearing Restrictions: No    Mobility  Bed Mobility Overal bed mobility: Needs Assistance Bed Mobility: Supine to Sit     Supine to sit: Min assist        Transfers Overall transfer level: Needs assistance Equipment used: Rolling walker (2 wheeled) Transfers: Sit to/from Stand Sit to Stand: Min assist            Ambulation/Gait Ambulation/Gait assistance: Min guard Gait Distance (Feet): 100 Feet Assistive device: Rolling walker (2 wheeled) Gait Pattern/deviations: Step-to pattern Gait  velocity: decr Gait velocity interpretation: <1.8 ft/sec, indicate of risk for recurrent falls General Gait Details: pt with short step to gait, requiring 2 brief standing rest breaks during mobility   Stairs             Wheelchair Mobility    Modified Rankin (Stroke Patients Only)       Balance Overall balance assessment: Needs assistance Sitting-balance support: Single extremity supported;Feet supported Sitting balance-Leahy Scale: Good Sitting balance - Comments: close supervision   Standing balance support: Bilateral upper extremity supported Standing balance-Leahy Scale: Fair Standing balance comment: minG with BUE support of RW                            Cognition Arousal/Alertness: Lethargic(progressing to more alert with mobility) Behavior During Therapy: Flat affect Overall Cognitive Status: Impaired/Different from baseline                               Problem Solving: Slow processing;Decreased initiation        Exercises      General Comments General comments (skin integrity, edema, etc.): VSS on RA      Pertinent Vitals/Pain Pain Assessment: No/denies pain    Home Living                      Prior Function            PT Goals (current goals can now be found in the care plan section) Acute Rehab PT Goals Patient Stated Goal: get better, play with grandkids Progress towards PT goals: Progressing toward goals  Frequency    Min 3X/week      PT Plan Discharge plan needs to be updated    Co-evaluation              AM-PAC PT "6 Clicks" Mobility   Outcome Measure  Help needed turning from your back to your side while in a flat bed without using bedrails?: A Little Help needed moving from lying on your back to sitting on the side of a flat bed without using bedrails?: A Little Help needed moving to and from a bed to a chair (including a wheelchair)?: A Little Help needed standing up from a  chair using your arms (e.g., wheelchair or bedside chair)?: A Little Help needed to walk in hospital room?: A Little Help needed climbing 3-5 steps with a railing? : A Lot 6 Click Score: 17    End of Session Equipment Utilized During Treatment: Gait belt Activity Tolerance: Patient tolerated treatment well Patient left: in chair;with call bell/phone within reach;with family/visitor present Nurse Communication: Mobility status PT Visit Diagnosis: Unsteadiness on feet (R26.81);Other abnormalities of gait and mobility (R26.89);Muscle weakness (generalized) (M62.81)     Time: 6605-6372 PT Time Calculation (min) (ACUTE ONLY): 34 min  Charges:  $Gait Training: 8-22 mins $Therapeutic Activity: 8-22 mins                     Zenaida Niece, PT, DPT Acute Rehabilitation Pager: [phone]    Zenaida Niece 03/28/2019, 5:41 PM

## 2019-03-28 NOTE — Progress Notes (Signed)
Paged by nursing staff to assess lethargy.  Patient just returned from tunneled HD catheter 2 hours ago.  Since returning, she has been lethargic.  On my examination, she does appear to be mildly sedated however able to answer questions and follow instructions without any issue.  Strength equal bilaterally on physical exam.  Blood pressure borderline but not severely low.  She just received a dose of Narcan.  Will reassess in 1 hour.  I suspect she is slow to recover from sedation, no sign of significant encephalopathy.

## 2019-03-28 NOTE — Procedures (Signed)
Pre-procedure Diagnosis: ESRD Post-procedure Diagnosis: Same  Successful conversion of non-tunneled to tunneled HD catheter with tips terminating within the superior aspect of the right atrium.    Complications: None Immediate  EBL: Minimal   The catheter is ready for immediate use.   Ronny Bacon, MD Pager #: [phone]

## 2019-03-28 NOTE — Progress Notes (Signed)
Patient ID: [name], female   DOB: May 23, 1960, 59 y.o.   MRN: [id number] P    Advanced Heart Failure Rounding Note  PCP-Cardiologist: Kate Sable, MD   Subjective:    She has been tolerating HD reasonably well.  SBP 100s this morning on midodrine 15 tid.   She and her husband want to get home as soon as possible and do not want CIR or SNF placement.  She did not get PT yesterday.   New diagnosis of multiple myeloma. Bx of lower abdomen was for possible calciphylaxis was negative (? false negative).   Echo: EF < 20%, no LVH, mildly reduced RV systolic function, severe biatrial enlargement, severe TR.  RHC Procedural Findings (dobutamine 2.5 mcg/kg/min): Hemodynamics (mmHg) RA 18 RV 43/18 PA 45/19, mean 28 PCWP mean 19 Oxygen saturations: PA 63% AO 94% Cardiac Output (Fick) 6.1  Cardiac Index (Fick) 3.06 PVR 1.5 WU PAPI 1.4  CT chest w/o contrast: Atelectasis, no significant pulmonary disease.    Objective:   Weight Range: 56.2 kg Body mass index is 20.01 kg/m.   Vital Signs:   Temp:  [98.1 F (36.7 C)-98.8 F (37.1 C)] 98.1 F (36.7 C) (01/31 0846) Pulse Rate:  [60] 60 (01/30 1958) Resp:  [14-16] 14 (01/30 1958) BP: (98-104)/(55-70) 104/70 (01/31 0846) SpO2:  [92 %-100 %] 100 % (01/30 1958) Last BM Date: 03/27/19  Weight change: Filed Weights   03/23/19 0356 03/26/19 1700 03/27/19 0427  Weight: 67.9 kg 67.7 kg 56.2 kg    Intake/Output:   Intake/Output Summary (Last 24 hours) at 03/28/2019 0945 Last data filed at 03/27/2019 1535 Gross per 24 hour  Intake 120 ml  Output 0 ml  Net 120 ml      Physical Exam   General: NAD Neck: JVP 10 cm, no thyromegaly or thyroid nodule.  Lungs: Clear to auscultation bilaterally with normal respiratory effort. CV: Nondisplaced PMI.  Heart regular S1/S2, no S3/S4, no murmur.  1+ ankle edema.    Abdomen: Soft, nontender, no hepatosplenomegaly, no distention.  Skin: Tender subcutaneous nodules lower  abdomen/upper legs.   Neurologic: Alert and oriented x 3.  Psych: Normal affect. Extremities: No clubbing or cyanosis.  HEENT: Normal.    Telemetry   A fib BiV pacing 60 Personally reviewed    Labs    CBC Recent Labs    03/27/19 0549 03/28/19 0453  WBC 11.0* 11.7*  HGB 9.2* 8.8*  HCT 27.6* 25.7*  MCV 82.1 80.8  PLT 182 846   Basic Metabolic Panel Recent Labs    03/27/19 0549 03/28/19 0453  NA 124* 123*  K 4.3 4.6  CL 83* 82*  CO2 26 24  GLUCOSE 386* 270*  BUN 38* 59*  CREATININE 3.73* 5.51*  CALCIUM 8.4* 8.8*  PHOS 4.6 4.7*   Liver Function Tests Recent Labs    03/27/19 0549 03/28/19 0453  ALBUMIN 1.9* 1.9*   No results for input(s): LIPASE, AMYLASE in the last 72 hours. Cardiac Enzymes No results for input(s): CKTOTAL, CKMB, CKMBINDEX, TROPONINI in the last 72 hours.  BNP: BNP (last 3 results) Recent Labs    03/07/19 2101  BNP 1,072.0*    ProBNP (last 3 results) No results for input(s): PROBNP in the last 8760 hours.   D-Dimer No results for input(s): DDIMER in the last 72 hours. Hemoglobin A1C No results for input(s): HGBA1C in the last 72 hours. Fasting Lipid Panel No results for input(s): CHOL, HDL, LDLCALC, TRIG, CHOLHDL, LDLDIRECT in the last 72 hours.  Thyroid Function Tests No results for input(s): TSH, T4TOTAL, T3FREE, THYROIDAB in the last 72 hours.  Invalid input(s): FREET3  Other results:   Imaging    No results found.   Medications:     Scheduled Medications: . allopurinol  150 mg Oral q morning - 10a  . chlorhexidine      . Chlorhexidine Gluconate Cloth  6 each Topical Q0600  . Chlorhexidine Gluconate Cloth  6 each Topical Q0600  . feeding supplement (NEPRO CARB STEADY)  237 mL Oral TID BM  . gabapentin  300 mg Oral Daily  . heparin      . insulin aspart  0-15 Units Subcutaneous TID WC  . insulin aspart  0-5 Units Subcutaneous QHS  . insulin glargine  5 Units Subcutaneous Daily  . lidocaine (PF)  5 mL Other  Once  . lidocaine      . midodrine  15 mg Oral TID WC  . multivitamin  1 tablet Oral QHS  . polyethylene glycol  17 g Oral BID  . senna  1 tablet Oral Daily  . sodium chloride flush  10-40 mL Intracatheter Q12H  . sodium chloride flush  3 mL Intravenous Q12H  . vitamin B-12  100 mcg Oral Daily    Infusions: . sodium chloride 10 mL/hr at 03/14/19 1615  . heparin 1,450 Units/hr (03/28/19 0748)  . vancomycin      PRN Medications: sodium chloride, acetaminophen **OR** acetaminophen, heparin, heparin, ondansetron **OR** ondansetron (ZOFRAN) IV, sodium chloride, sodium chloride flush, sodium chloride flush, traMADol    Assessment/Plan   1. Acute Hypoxic Respiratory Failure: Resolved, off oxygen. 2. 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  Last echo in 2/19 with EF 15-20%. She has a [employer] CRT-D device. Admitted with progressive dyspnea and 30 lb weight gain. Empirically started on dobutamine 2.5 due to concern for low output. RHC showed R>L heart failure with low PAPI adequate cardiac output on dobutamine. Now off dobutamine.  Initially started on iHD after she failed high dose diuretics, then CVVH with intractable volume overload.  Weight down 50 lbs from baseline, now off CVVH.  She will be HD dependent.   - Continue midodrine 15 mg tid with relative hypotension and need for iHD.  - HD MWF.  - Stopped digoxin and benazepril with AKI.  - Stopped beta blocker with concern for low output.  - No BP room for hydralazine/nitrates.  - Cardiac amyloidosis is a concern with myeloma, but echo myocardial appearance is not consistent. BM biopsy negative for amyloidosis.  - LE u/s negative for DVT 2. AKI on CKD stage 4: Baseline creatinine around 3, went up to 5 with BUN 150 this admission. Suspect cardiorenal syndrome superimposed on possible diabetic nephropathy. She will be dialysis-dependent going forward. - HD MWF  - She is getting her tunneled catheter  today.  - If she continues to tolerate HD as an outpatient, will need to see vascular for permanent access.  - Nephrology assistance appreciated.    3. Chronic atrial fibrillation: Warfarin held for procedures, now on heparin gtt. Long-term, will transition to apixaban with some concern for warfarin skin necrosis.    - Start apixaban after tunneled catheter placed.  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  5. DM: Per primary.  6. Monoclonal gammopathy:  LV morphology does not look consistent with AL amyloidosis. She has been seen by hematology, has had bone marrow bx showing 14% plasma cells by flow cytometry on the aspirate which is  by definition multiple myeloma. Seen by heme/onc, plan for outpatient treatment.  BM bx negative for amyloidosis.  7. Tricuspid regurgitation: Severe by echo.  8.  Subcutaneous nodules: ?calciphylaxis versus less likely warfarin skin necrosis.  - Off warfarin, will use Eliquis in future.  - Bx negative for calciphylaxis. (? False negative)  - Renal managing for possible calciphylaxis.  9. Deconditioning: CIR vs SNF recommended, she insists on going home.  Will continue to work on PT over the weekend, aim for home with HH/PT hopefully Monday after HD.   10. Poor prognosis.  Not sure she will tolerate iHD with relative hypotension.  Palliative care following, she is now a limited code.    Place tunneled catheter today. Hopefully can do HD on Monday then get home afterwards.    Length of Stay: Swan, MD  03/28/2019, 9:45 AM  Advanced Heart Failure Team Pager [phone] (M-F; [place])  Please contact [facility] for night-coverage after hours (4p -7a ) and weekends on [email or web address]

## 2019-03-28 NOTE — Significant Event (Signed)
Rapid Response Event Note  Overview: Neurologic - Decreased LOC   Initial Focused Assessment: Patient returned from [facility] placement about an hour ago but is still hard to arouse. I asked the staff to check blood sugar and informed that I was on my way up to see her. Upon arrival, patient was hard to arouse. She did wake to noxious stimuli - once awake, she was able to tell that she was okay, able to follow my commands, she could move all extremities. Pupils 4 mm brisk/reactive/equal. VSS. Skin warm and dry. Blood sugar was normal   Interventions: - Narcan 0.2 mg IV x 1 - mild improvement after the dose.  Plan of Care: - MDs were updated by nursing staff. - Monitor VS and neuro status  - I suspect this just from the patient receiving pain/sedation meds during the procedure. Monitor closely.  Event Summary:  Call Time [place]   Terris Bodin R

## 2019-03-28 NOTE — Progress Notes (Addendum)
[facility] ROUNDING NOTE   Subjective:   Is a 59 year old lady that was admitted 03/08/2019 with a history of chronic atrial fibrillation complete heart block status post pacemaker placement chronic systolic congestive heart failure with ejection fraction of 15 to 20% liver cirrhosis admitted with increasing swelling of legs and abdomen and a 30 pound weight gain.  She was found to have acute on chronic biventricular heart failure.  She was treated with CRRT 03/18/2019-03/23/2019.  Marland Kitchen  There is concern for cardiac amyloid this is being evaluated by cardiology bone marrow 03/11/2018 showed 14% plasma cells consistent with multiple myeloma  but LV morphology did not appear consistent with AL amyloid.  She is no longer on warfarin due to concern for warfarin skin necrosis.   .  She appears to be tolerating her dialysis treatments relatively well 03/26/2019 next Alysis treatment be 03/29/2019  Blood pressure 104/70 pulse 60 temperature 98.1 O2 sats 96%.  Sodium 123 potassium 4.6 chloride 82 CO2 24 BUN 59 creatinine 5.51 BUN 59 glucose 270 CO2 24 calcium 8.8 hemoglobin 8.8 BC 11.7  No urine output  Allopurinol 150 mg daily, Neurontin 300 mg twice daily insulin sliding scale, glargine 5 units, midodrine 15 mg 3 times daily.   Objective:  Vital signs in last 24 hours:  Temp:  [98.1 F (36.7 C)-98.8 F (37.1 C)] 98.1 F (36.7 C) (01/31 0846) Pulse Rate:  [60] 60 (01/30 1958) Resp:  [14-16] 14 (01/30 1958) BP: (98-104)/(55-70) 104/70 (01/31 0846) SpO2:  [92 %-100 %] 100 % (01/30 1958)  Weight change:  Filed Weights   03/23/19 0356 03/26/19 1700 03/27/19 0427  Weight: 67.9 kg 67.7 kg 56.2 kg    Intake/Output: I/O last 3 completed shifts: In: 120 [P.O.:120] Out: 0    Intake/Output this shift:  No intake/output data recorded. Alert nondistressed CVS- RRR JVP to jaw right IJ tunneled dialysis catheter 2/6 systolic murmur RS-clear to auscultation ABD- BS present soft  non-distended EXT-trace edema   Basic Metabolic Panel: Recent Labs  Lab 03/22/19 0500 03/22/19 1700 03/23/19 0405 03/24/19 0240 03/25/19 0439 03/25/19 0439 03/25/19 1219 03/25/19 1219 03/26/19 0459 03/27/19 0549 03/28/19 0453  NA 129*   < > 127*   < > 132*  --  132*  --  129* 124* 123*  K 5.0   < > 5.0   < > 4.0  --  4.2  --  4.7 4.3 4.6  CL 93*   < > 94*   < > 90*  --  95*  --  90* 83* 82*  CO2 20*   < > 21*   < > 24  --  24  --  22 26 24   GLUCOSE 194*   < > 218*   < > 352*  --  273*  --  215* 386* 270*  BUN 25*   < > 29*   < > 36*  --  42*  --  61* 38* 59*  CREATININE 1.85*   < > 1.94*   < > 3.17*  --  3.65*  --  4.88* 3.73* 5.51*  CALCIUM 9.1   < > 8.9   < > 8.7*   < > 8.4*   < > 8.7* 8.4* 8.8*  MG 2.9*  --  2.8*  --   --   --   --   --   --   --   --   PHOS 2.9   < > 2.9   < > 4.7*  --  4.7*  --  5.8* 4.6 4.7*   < > = values in this interval not displayed.    Liver Function Tests: Recent Labs  Lab 03/25/19 0439 03/25/19 1219 03/26/19 0459 03/27/19 0549 03/28/19 0453  ALBUMIN 2.1* 2.2* 2.0* 1.9* 1.9*   No results for input(s): LIPASE, AMYLASE in the last 168 hours. No results for input(s): AMMONIA in the last 168 hours.  CBC: Recent Labs  Lab 03/24/19 0230 03/25/19 0439 03/25/19 1219 03/27/19 0549 03/28/19 0453  WBC 14.1* 12.5* 11.9* 11.0* 11.7*  HGB 11.0* 9.6* 10.1* 9.2* 8.8*  HCT 32.0* 28.7* 29.6* 27.6* 25.7*  MCV 81.8 82.7 81.5 82.1 80.8  PLT 199 149* 165 182 204    Cardiac Enzymes: No results for input(s): CKTOTAL, CKMB, CKMBINDEX, TROPONINI in the last 168 hours.  BNP: Invalid input(s): POCBNP  CBG: Recent Labs  Lab 03/27/19 0856 03/27/19 1135 03/27/19 1639 03/27/19 2151 03/28/19 0837  GLUCAP 370* 269* 225* 228* 233*    Microbiology: Results for orders placed or performed during the hospital encounter of 03/07/19  Respiratory Panel by RT PCR (Flu A&B, Covid) - Nasopharyngeal Swab     Status: None   Collection Time: 03/07/19  9:21  PM   Specimen: Nasopharyngeal Swab  Result Value Ref Range Status   SARS Coronavirus 2 by RT PCR NEGATIVE NEGATIVE Final    Comment: (NOTE) SARS-CoV-2 target nucleic acids are NOT DETECTED. The SARS-CoV-2 RNA is generally detectable in upper respiratoy specimens during the acute phase of infection. The lowest concentration of SARS-CoV-2 viral copies this assay can detect is 131 copies/mL. A negative result does not preclude SARS-Cov-2 infection and should not be used as the sole basis for treatment or other patient management decisions. A negative result may occur with  improper specimen collection/handling, submission of specimen other than nasopharyngeal swab, presence of viral mutation(s) within the areas targeted by this assay, and inadequate number of viral copies (<131 copies/mL). A negative result must be combined with clinical observations, patient history, and epidemiological information. The expected result is Negative. Fact Sheet for Patients:  PinkCheek.be Fact Sheet for Healthcare Providers:  GravelBags.it This test is not yet ap proved or cleared by the [place] FDA and  has been authorized for detection and/or diagnosis of SARS-CoV-2 by FDA under an Emergency Use Authorization (EUA). This EUA will remain  in effect (meaning this test can be used) for the duration of the COVID-19 declaration under Section 564(b)(1) of the Act, 21 U.S.C. section 360bbb-3(b)(1), unless the authorization is terminated or revoked sooner.    Influenza A by PCR NEGATIVE NEGATIVE Final   Influenza B by PCR NEGATIVE NEGATIVE Final    Comment: (NOTE) The Xpert Xpress SARS-CoV-2/FLU/RSV assay is intended as an aid in  the diagnosis of influenza from Nasopharyngeal swab specimens and  should not be used as a sole basis for treatment. Nasal washings and  aspirates are unacceptable for Xpert Xpress SARS-CoV-2/FLU/RSV  testing. Fact  Sheet for Patients: PinkCheek.be Fact Sheet for Healthcare Providers: GravelBags.it This test is not yet approved or cleared by the [place] FDA and  has been authorized for detection and/or diagnosis of SARS-CoV-2 by  FDA under an Emergency Use Authorization (EUA). This EUA will remain  in effect (meaning this test can be used) for the duration of the  Covid-19 declaration under Section 564(b)(1) of the Act, 21  U.S.C. section 360bbb-3(b)(1), unless the authorization is  terminated or revoked. Performed at [facility], [address], [place],  [address]  MRSA PCR Screening     Status: None   Collection Time: 03/08/19 10:26 PM   Specimen: Nasopharyngeal  Result Value Ref Range Status   MRSA by PCR NEGATIVE NEGATIVE Final    Comment:        The GeneXpert MRSA Assay (FDA approved for NASAL specimens only), is one component of a comprehensive MRSA colonization surveillance program. It is not intended to diagnose MRSA infection nor to guide or monitor treatment for MRSA infections. Performed at [facility], [place] [address], [place], [place] [address]    *Note: Due to a large number of results and/or encounters for the requested time period, some results have not been displayed. A complete set of results can be found in Results Review.    Coagulation Studies: Recent Labs    03/26/19 0459 03/27/19 0549 03/28/19 0453  LABPROT 17.8* 18.2* 17.0*  INR 1.5* 1.5* 1.4*    Urinalysis: No results for input(s): COLORURINE, LABSPEC, PHURINE, GLUCOSEU, HGBUR, BILIRUBINUR, KETONESUR, PROTEINUR, UROBILINOGEN, NITRITE, LEUKOCYTESUR in the last 72 hours.  Invalid input(s): APPERANCEUR    Imaging: No results found.   Medications:   . sodium chloride 10 mL/hr at 03/14/19 1615  . heparin 1,450 Units/hr (03/28/19 0748)  . vancomycin     . heparin      . lidocaine      . allopurinol  150 mg Oral q morning  - 10a  . Chlorhexidine Gluconate Cloth  6 each Topical Q0600  . feeding supplement (NEPRO CARB STEADY)  237 mL Oral TID BM  . gabapentin  300 mg Oral Daily  . insulin aspart  0-15 Units Subcutaneous TID WC  . insulin aspart  0-5 Units Subcutaneous QHS  . insulin glargine  5 Units Subcutaneous Daily  . lidocaine (PF)  5 mL Other Once  . midodrine  15 mg Oral TID WC  . multivitamin  1 tablet Oral QHS  . polyethylene glycol  17 g Oral BID  . senna  1 tablet Oral Daily  . sodium chloride flush  10-40 mL Intracatheter Q12H  . sodium chloride flush  3 mL Intravenous Q12H  . vitamin B-12  100 mcg Oral Daily   sodium chloride, acetaminophen **OR** acetaminophen, heparin, heparin, ondansetron **OR** ondansetron (ZOFRAN) IV, sodium chloride, sodium chloride flush, sodium chloride flush, traMADol  Assessment/ Plan:   Acute kidney injury on chronic kidney disease stage IV with a baseline creatinine about 3 mg/dL.  On admission 5 mg/dL thought to be secondary to cardiorenal syndrome.  Edema has significantly improved.  CRRT 03/18/2019-03/23/2019.  has been tolerating dialysis 03/26/2019 with 2 L removed.  It appears that hypotension may be limiting ability to effectively ultrafilter fluid.  We will continue to follow.  Patient is weak although family would like to take patient home I discussed with Dr. Marigene Ehlers.  At some point she will need placement of permanent access.  This will depend on if she manages to continue to tolerate dialysis.  We will plan next dialysis treatment for 03/29/2019.  She has a temporary dialysis cath and this may need to be converted to a tunneled dialysis catheter.  Tentative arrangements have been made for outpatient dialysis at [facility].  We will need to confirm this 03/29/2019  Hypertension/volume continue volume removal.  CRRT since 03/18/2019-03/23/2019 t.  Increase midodrine to 15 mg 3 times a day in order to support blood pressure for dialysis.  Metabolic acidosis  resolved  Hyperkalemia resolved with dialysis  Warfarin skin necrosis biopsy negative for  calciphylaxis would continue off warfarin as form of anticoagulation  Chronic atrial fibrillation IV heparin  Diabetes mellitus as per primary service  Multiple myeloma status post bone marrow biopsy consistent with this diagnosis  Patient has declined palliative care services and wishes to continue with dialysis.    LOS: [place] @TODAY @9 :08 AM

## 2019-03-28 NOTE — Progress Notes (Signed)
[place] for Heparin Indication: atrial fibrillation  Allergies  Allergen Reactions  . Wheat Swelling  . Latex Rash  . Penicillins Rash  . Sulfa Antibiotics Rash    Patient Measurements: Height: 5\' 6"  (167.6 cm) Weight: 124 lb (56.2 kg)(May be innacurate) IBW/kg (Calculated) : 59.3 Heparin Dosing Weight: 56.2 kg  Vital Signs: Temp: 98.8 F (37.1 C) (01/30 1958) BP: 98/55 (01/30 1958) Pulse Rate: 60 (01/30 1958)  Labs: Recent Labs    03/25/19 1219 03/25/19 1219 03/26/19 0459 03/27/19 0549 03/28/19 0453  HGB 10.1*   < >  --  9.2* 8.8*  HCT 29.6*  --   --  27.6* 25.7*  PLT 165  --   --  182 204  LABPROT  --   --  17.8* 18.2* 17.0*  INR  --   --  1.5* 1.5* 1.4*  HEPARINUNFRC  --   --  0.40 0.23* 0.20*  CREATININE 3.65*   < > 4.88* 3.73* 5.51*   < > = values in this interval not displayed.    Estimated Creatinine Clearance: 9.8 mL/min (A) (by C-G formula based on SCr of 5.51 mg/dL (H)).   Assessment: 59 y.o. female with Afib for heparin  Goal of Therapy:  Heparin level 0.3-0.7 units/ml Monitor platelets by anticoagulation protocol: Yes   Plan:  Increase Heparin 1450 units/hr  Caryl Pina 03/28/2019,6:20 AM

## 2019-03-29 LAB — RENAL FUNCTION PANEL
Albumin: 1.9 g/dL — ABNORMAL LOW (ref 3.5–5.0)
Anion gap: 20 — ABNORMAL HIGH (ref 5–15)
BUN: 78 mg/dL — ABNORMAL HIGH (ref 6–20)
CO2: 22 mmol/L (ref 22–32)
Calcium: 8.9 mg/dL (ref 8.9–10.3)
Chloride: 77 mmol/L — ABNORMAL LOW (ref 98–111)
Creatinine, Ser: 6.48 mg/dL — ABNORMAL HIGH (ref 0.44–1.00)
GFR calc Af Amer: 7 mL/min — ABNORMAL LOW (ref >60)
GFR calc non Af Amer: 6 mL/min — ABNORMAL LOW (ref >60)
Glucose, Bld: 211 mg/dL — ABNORMAL HIGH (ref 70–99)
Phosphorus: 6.2 mg/dL — ABNORMAL HIGH (ref 2.5–4.6)
Potassium: 5.5 mmol/L — ABNORMAL HIGH (ref 3.5–5.1)
Sodium: 119 mmol/L — CL (ref 135–145)

## 2019-03-29 LAB — BASIC METABOLIC PANEL
Anion gap: 17 — ABNORMAL HIGH (ref 5–15)
BUN: 21 mg/dL — ABNORMAL HIGH (ref 6–20)
CO2: 22 mmol/L (ref 22–32)
Calcium: 8.6 mg/dL — ABNORMAL LOW (ref 8.9–10.3)
Chloride: 89 mmol/L — ABNORMAL LOW (ref 98–111)
Creatinine, Ser: 2.75 mg/dL — ABNORMAL HIGH (ref 0.44–1.00)
GFR calc Af Amer: 21 mL/min — ABNORMAL LOW (ref >60)
GFR calc non Af Amer: 18 mL/min — ABNORMAL LOW (ref >60)
Glucose, Bld: 134 mg/dL — ABNORMAL HIGH (ref 70–99)
Potassium: 3.9 mmol/L (ref 3.5–5.1)
Sodium: 128 mmol/L — ABNORMAL LOW (ref 135–145)

## 2019-03-29 LAB — GLUCOSE, CAPILLARY
Glucose-Capillary: 138 mg/dL — ABNORMAL HIGH (ref 70–99)
Glucose-Capillary: 136 mg/dL — ABNORMAL HIGH (ref 70–99)

## 2019-03-29 LAB — CBC
HCT: 25 % — ABNORMAL LOW (ref 36.0–46.0)
Hemoglobin: 8.6 g/dL — ABNORMAL LOW (ref 12.0–15.0)
MCH: 27.5 pg (ref 26.0–34.0)
MCHC: 34.4 g/dL (ref 30.0–36.0)
MCV: 79.9 fL — ABNORMAL LOW (ref 80.0–100.0)
Platelets: 237 10*3/uL (ref 150–400)
RBC: 3.13 MIL/uL — ABNORMAL LOW (ref 3.87–5.11)
RDW: 19.9 % — ABNORMAL HIGH (ref 11.5–15.5)
WBC: 11.7 10*3/uL — ABNORMAL HIGH (ref 4.0–10.5)
nRBC: 0.6 % — ABNORMAL HIGH (ref 0.0–0.2)

## 2019-03-29 MED ORDER — TRAMADOL HCL 50 MG PO TABS: 50.0000 mg | ORAL_TABLET | Freq: Two times a day (BID) | ORAL | 0 refills | Status: AC | PRN

## 2019-03-29 MED ORDER — ALTEPLASE 2 MG IJ SOLR: 2.0000 mg | Freq: Once | INTRAMUSCULAR | Status: DC | PRN

## 2019-03-29 MED ORDER — TRAMADOL HCL 50 MG PO TABS: 50.0000 mg | ORAL_TABLET | Freq: Two times a day (BID) | ORAL | 0 refills | Status: DC | PRN

## 2019-03-29 MED ORDER — ACETAMINOPHEN 325 MG PO TABS
ORAL_TABLET | ORAL | Status: AC
  Administered 2019-03-29: 650 mg via ORAL
  Filled 2019-03-29: qty 2

## 2019-03-29 MED ORDER — GABAPENTIN 300 MG PO CAPS: 300.0000 mg | ORAL_CAPSULE | Freq: Every day | ORAL | 6 refills | Status: AC

## 2019-03-29 MED ORDER — ALLOPURINOL 300 MG PO TABS: 150.0000 mg | ORAL_TABLET | Freq: Every morning | ORAL | 6 refills | Status: AC

## 2019-03-29 MED ORDER — SODIUM CHLORIDE 0.9 % IV SOLN: 100.0000 mL | INTRAVENOUS | Status: DC | PRN

## 2019-03-29 MED ORDER — LIDOCAINE HCL (PF) 1 % IJ SOLN: 5.0000 mL | INTRAMUSCULAR | Status: DC | PRN

## 2019-03-29 MED ORDER — PENTAFLUOROPROP-TETRAFLUOROETH EX AERO: 1.0000 "application " | INHALATION_SPRAY | CUTANEOUS | Status: DC | PRN

## 2019-03-29 MED ORDER — APIXABAN 5 MG PO TABS: 5.0000 mg | ORAL_TABLET | Freq: Two times a day (BID) | ORAL | 6 refills | Status: AC

## 2019-03-29 MED ORDER — HEPARIN SODIUM (PORCINE) 1000 UNIT/ML DIALYSIS: 1000.0000 [IU] | INTRAMUSCULAR | Status: DC | PRN

## 2019-03-29 MED ORDER — MIDODRINE HCL 5 MG PO TABS: 15.0000 mg | ORAL_TABLET | Freq: Three times a day (TID) | ORAL | 6 refills | Status: AC

## 2019-03-29 MED ORDER — HEPARIN SODIUM (PORCINE) 1000 UNIT/ML IJ SOLN
INTRAMUSCULAR | Status: AC
  Administered 2019-03-29: 3200 [IU] via INTRAVENOUS_CENTRAL
  Filled 2019-03-29: qty 4

## 2019-03-29 MED ORDER — RENA-VITE PO TABS: 1.0000 | ORAL_TABLET | Freq: Every day | ORAL | 6 refills | Status: AC

## 2019-03-29 MED ORDER — LIDOCAINE-PRILOCAINE 2.5-2.5 % EX CREA: 1.0000 "application " | TOPICAL_CREAM | CUTANEOUS | Status: DC | PRN

## 2019-03-29 NOTE — Progress Notes (Signed)
[facility] Progress Note   Assessment/ Plan:   1. Volume overload: treated initially with CRRT and then HD.  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  To start Thursday. 3.  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 Nutrition: renal diet  6. Hypertension: on midodrine 15 TID 7.  Anemia: ESA as appropriate, avoid iron 8.  Afib: now on Eliquis 9.  Breast pain- pt requesting mammogram, would rec as OP 10: Multiple myeloma 11.  Dispo: pt and husband eager to go home today--> has deconditioning, pt reports has been sitting up in chair several hours in the afternoon.  Palliative care services have been declined.  I forsee a difficult road to recovery.  Subjective:    Seen s/p dialysis.  Pt and husband eager to be discharged.  Also reports breast pain.   Objective:   BP 104/61 (BP Location: Right Arm)   Pulse 60   Temp 98.1 F (36.7 C) (Oral)   Resp 19   Ht 5' 6"  (9.323 m)   Wt 73.9 kg   SpO2 100%   BMI 26.31 kg/m   Physical Exam: Gen: frail CVS: RRR Resp: muffled bibasilar breath sounds Abd: soft, has some painful subcutaneous nodules bilaterally Ext: 1+ LE edema ACCESS: has tunneled HD cath  Labs: BMET Recent Labs  Lab 03/24/19 0240 03/24/19 0240 03/25/19 0439 03/25/19 1219 03/26/19 0459 03/27/19 0549 03/28/19 0453 03/29/19 0723 03/29/19 1231  NA 125*   < > 132* 132* 129* 124* 123* 119* 128*  K 5.8*   < > 4.0 4.2 4.7 4.3 4.6 5.5* 3.9  CL 92*   < > 90* 95* 90* 83* 82* 77* 89*  CO2 19*   < > 24 24 22 26 24 22 22   GLUCOSE 263*   < > 352* 273* 215* 386* 270* 211* 134*  BUN 60*   < > 36* 42* 61* 38* 59* 78* 21*  CREATININE 4.08*   < > 3.17* 3.65* 4.88* 3.73* 5.51* 6.48* 2.75*  CALCIUM 9.1   < > 8.7* 8.4* 8.7* 8.4* 8.8* 8.9 8.6*  PHOS 4.3  --  4.7* 4.7* 5.8* 4.6 4.7* 6.2*  --    < > = values in this interval not displayed.   CBC Recent  Labs  Lab 03/25/19 1219 03/27/19 0549 03/28/19 0453 03/29/19 0723  WBC 11.9* 11.0* 11.7* 11.7*  HGB 10.1* 9.2* 8.8* 8.6*  HCT 29.6* 27.6* 25.7* 25.0*  MCV 81.5 82.1 80.8 79.9*  PLT 165 182 204 237      Medications:    . allopurinol  150 mg Oral q morning - 10a  . apixaban  5 mg Oral BID  . Chlorhexidine Gluconate Cloth  6 each Topical Q0600  . Chlorhexidine Gluconate Cloth  6 each Topical Q0600  . feeding supplement (NEPRO CARB STEADY)  237 mL Oral TID BM  . gabapentin  300 mg Oral Daily  . insulin aspart  0-15 Units Subcutaneous TID WC  . insulin aspart  0-5 Units Subcutaneous QHS  . insulin glargine  5 Units Subcutaneous Daily  . lidocaine (PF)  5 mL Other Once  . midodrine  15 mg Oral TID WC  . multivitamin  1 tablet Oral QHS  . polyethylene glycol  17 g Oral BID  . senna  1 tablet Oral Daily  . sodium chloride flush  10-40 mL Intracatheter Q12H  .  sodium chloride flush  3 mL Intravenous Q12H  . vitamin B-12  100 mcg Oral Daily     Madelon Lips, MD 03/29/2019, 4:03 PM

## 2019-03-29 NOTE — Discharge Summary (Signed)
Advanced Heart Failure Team  Discharge Summary   Patient ID: [name] MRN: [id number], DOB/AGE: November 10, 1960 59 y.o. Admit date: 03/07/2019 D/C date:     03/29/2019   Primary Discharge Diagnoses:  1. Acute Hypoxic Respiratory Failure 2. 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 Chronic atrial fibrillation 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  5. DM: Per primary.  6. Monoclonal gammopathy 7. Tricuspid regurgitation 8.  Subcutaneous nodules: ?calciphylaxis versus less likely warfarin skin necrosis.  9. Hyponatremia 10. Deconditioning 11. Poor prognosis.  Not sure she will tolerate iHD with relative hypotension.  Palliative care following, she is now a limited code.    Hospital Course:  [name] presents as a transfer from [facility] for further management of acute on chronic systolic heart failure w/ progressive renal failure. She is being seen for advanced heart failure consultation for consideration for inotropic therapy, at the request of Dr. Loleta Books, Internal Medicine.   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 Last echocardiogram 10/2017 w/ EF 15-20% w/ diffuse hypokinesis, mild LVH w/ doppler parameters c/w restrictive physiology. RV was mildly dilated w/ normal systolic function. Followed in [facility] by Dr. Bronson Ing. Dr. Lovena Le follows ICD. Last in clinic device check 8/20 demonstrated she was bi-ventricularly pacing 97% of the time.   Presented to [facility] 03/07/19 w/ complaints of worsening lower extremity and abdominal edema w/ 30 lb wt gain. Transferred from [facility] to [facility] with for progressive HF. Due to difficulty diuresing and worsening HF, placed on dobutamine. This was later weaned  off with addition of midodrine. Nephrology consulted for worsening renal function. Started CVVHD and later tolerated iHD. Tunneled cath placed until permanent access placed down the road.   Inpatient rehab was recommended however she declined. Discharging home with [facility]. Her husband plans to take her home and provide 24 hour care. She will have dialysis on Tues-Thurs-Sat [facility] in [place].   Palliative Care consulted and she declined palliative care services.   See below for detailed d/c summary.   1. Acute Hypoxic Respiratory Failure: Resolved, off oxygen. 2. 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 Last echo in 2/19 with EF 15-20%. She has a [employer] CRT-D device. Admitted with progressive dyspnea and 30 lb weight gain. Empirically started on dobutamine 2.5 due to concern for low output. RHC showed R>L heart failure with low PAPI adequate cardiac output on dobutamine. Now off dobutamine.  Initially started on iHD after she failed high dose diuretics, then CVVH with intractable volume overload.  Weight down 50 lbs from baseline, now off CVVH.  She will be HD dependent.   - Continue midodrine 15 mg tid with relative hypotension and need for iHD.  - Plan to continue iHD after discharge.  - Stopped digoxin and benazepril with AKI.  - Stopped beta blocker with concern for low output.  - No BP room for hydralazine/nitrates.  - Cardiac amyloidosis is a concern with myeloma, but echo myocardial appearance is not consistent. BM biopsy negative for amyloidosis.  - LE u/s negative for DVT 2. AKI on CKD stage 4: Baseline creatinine around 3, went up to 5 with BUN 150 this admission. Suspect cardiorenal syndrome superimposed on possible diabetic nephropathy. She will  be dialysis-dependent going forward. - She now has a tunneled catheter for HD   - If she continues to tolerate HD as an outpatient, will need to see vascular for permanent access.  - Plan for outpatient HD  Tues/Thus/Say in [place]. She has not gone to dialysis in the chair and she refuses to stay an attempt HD in the chair. - Nephrology assistance appreciated.    3. Chronic atrial fibrillation: Warfarin held for procedures, now on heparin gtt. Long-term, will transition to apixaban with some concern for warfarin skin necrosis.    - Continue apixaban.  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  5. DM: Resume home regimen.  6. Monoclonal gammopathy:  LV morphology does not look consistent with AL amyloidosis.She has been seen by hematology, has had bone marrow bx showing 14% plasma cells by flow cytometry on the aspirate which is by definition multiple myeloma. Seen by heme/onc, plan for outpatient treatment.  BM bx negative for amyloidosis.  She has follow up with hem/onc next week.  7. Tricuspid regurgitation: Severe by echo.  8.  Subcutaneous nodules: ?calciphylaxis versus less likely warfarin skin necrosis.  - Off warfarin, will use Eliquis in future.  - Bx negative for calciphylaxis. (? False negative)  - Renal managing for possible calciphylaxis.  9. Hyponatremia: Na lower at 119 today but improved after dialysis.   10. Deconditioning: CIR vs SNF recommended, she insists on going home.  Will need home PT. Refer to [facility]. BSC and walker ordered for d/c.  11. Poor prognosis.  Not sure she will tolerate iHD with relative hypotension.  Palliative care following, she is now a limited code.    Discharge Vitals: Blood pressure 104/61, pulse 60, temperature 98.1 F (36.7 C), temperature source Oral, resp. rate 19, height 5' 6" (1.676 m), weight 73.9 kg, SpO2 100 %.  Labs: Lab Results  Component Value Date   WBC 11.7 (H) 03/29/2019   HGB 8.6 (L) 03/29/2019   HCT 25.0 (L) 03/29/2019   MCV 79.9 (L) 03/29/2019   PLT 237 03/29/2019    Recent Labs  Lab 03/29/19 1231  NA 128*  K 3.9  CL 89*  CO2 22  BUN 21*  CREATININE 2.75*  CALCIUM 8.6*  GLUCOSE 134*   Lab Results  Component  Value Date   CHOL 109 09/23/2018   HDL 30.10 (L) 09/23/2018   LDLCALC 59 09/23/2018   TRIG 101.0 09/23/2018   BNP (last 3 results) Recent Labs    03/07/19 2101  BNP 1,072.0*    ProBNP (last 3 results) No results for input(s): PROBNP in the last 8760 hours.   Diagnostic Studies/Procedures  03/09/2019 RHC RA 18 RV 43/18 PA 45/19, mean 28 PCWP mean 19 Oxygen saturations: PA 63% AO 94% Cardiac Output (Fick) 6.1  Cardiac Index (Fick) 3.06 PVR 1.5 WU PAPI 1.4 IR Fluoro Guide CV Line Right  Result Date: 03/28/2019 INDICATION: End-stage renal disease. Please convert temporary dialysis catheter to a tunneled/permanent dialysis catheter for the continuation of dialysis. EXAM: FLUOROSCOPIC GUIDED CONVERSION OF NON TUNNELED TO TUNNELED CENTRAL VENOUS HEMODIALYSIS CATHETER COMPARISON:  Image guided temporary dialysis catheter placement-03/12/2019 MEDICATIONS: None ANESTHESIA/SEDATION: Moderate (conscious) sedation was employed during this procedure. A total of Versed 0.5 mg and Fentanyl 25 mcg was administered intravenously. Moderate Sedation Time: 11 minutes. The patient's level of consciousness and vital signs were monitored continuously by radiology nursing throughout the procedure under my direct supervision. FLUOROSCOPY TIME:  24 seconds (2 mGy) COMPLICATIONS: None immediate. PROCEDURE: Informed written  consent was obtained from the patient after a discussion of the risks, benefits, and alternatives to treatment. Questions regarding the procedure were encouraged and answered. The skin and external portion of the existing hemodialysis catheter was prepped with chlorhexidine in a sterile fashion, and a sterile drape was applied covering the operative field. Maximum barrier sterile technique with sterile gowns and gloves were used for the procedure. A timeout was performed prior to the initiation of the procedure. A lumen of the none tunneled temporary dialysis catheter was cannulated with a  stiff Glidewire in utilized for measurement purposes. Next, the Glidewire was advanced to the level of the IVC. A 19 cm tip to cuff HemoSplit dialysis catheter was tunneled from a site along the anterior chest to the venotomy site. Under intermittent fluoroscopic guidance, the temporary dialysis catheter was exchanged for a peel-away sheath. The tunneled dialysis catheter was inserted into the peel-away sheath with tips open terminating within in the superior aspect the right atrium. Final catheter positioning was confirmed and documented with a spot fluoroscopic image. The catheter aspirates and flushes normally. The catheter was flushed with appropriate volume heparin dwells. The catheter exit site was secured with a 0-Prolene retention sutures. The venotomy site was apposed with derma bond Steri-Strips. Both lumens were heparinized. A dressing was placed. The patient tolerated the procedure well without immediate post procedural complication. IMPRESSION: Successful fluoroscopic guided conversion of a temporary to a permanent 19 cm tip to cuff tunneled hemodialysis catheter with tips terminating within the right atrium. The catheter is ready for immediate use. Electronically Signed   By: Sandi Mariscal M.D.   On: 03/28/2019 10:55    Discharge Medications   Allergies as of 03/29/2019      Reactions   Wheat Swelling   Latex Rash   Penicillins Rash   Sulfa Antibiotics Rash      Medication List    STOP taking these medications   Accu-Chek Guide test strip Generic drug: glucose blood   Alcohol Prep Pads   benazepril 10 MG tablet Commonly known as: LOTENSIN   calcitRIOL 0.25 MCG capsule Commonly known as: ROCALTROL   cholecalciferol 25 MCG (1000 UNIT) tablet Commonly known as: VITAMIN D3   Coreg CR 40 MG 24 hr capsule Generic drug: carvedilol   digoxin 0.125 MG tablet Commonly known as: LANOXIN   meclizine 12.5 MG tablet Commonly known as: ANTIVERT   metolazone 2.5 MG tablet Commonly  known as: ZAROXOLYN   torsemide 20 MG tablet Commonly known as: DEMADEX   warfarin 5 MG tablet Commonly known as: COUMADIN     TAKE these medications   acetaminophen 500 MG tablet Commonly known as: TYLENOL Take 500 mg by mouth every 6 (six) hours as needed for moderate pain.   allopurinol 300 MG tablet Commonly known as: ZYLOPRIM Take 0.5 tablets (150 mg total) by mouth every morning. What changed:   medication strength  additional instructions   apixaban 5 MG Tabs tablet Commonly known as: ELIQUIS Take 1 tablet (5 mg total) by mouth 2 (two) times daily.   diclofenac Sodium 1 % Gel Commonly known as: VOLTAREN APPLY TO AFFECTED AREASETWICE DAILY.   gabapentin 300 MG capsule Commonly known as: NEURONTIN Take 1 capsule (300 mg total) by mouth daily.   insulin NPH Human 100 UNIT/ML injection Commonly known as: HumuLIN N Inject 0.34 mLs (34 Units total) into the skin daily. What changed: how much to take   Insulin Pen Needle 31G X 5 MM Misc 1 each by  Does not apply route daily. Use as instructed to check blood sugar 4 times daily. What changed: Another medication with the same name was changed. Make sure you understand how and when to take each.   Litetouch Pen Needles 31G X 8 MM Misc Generic drug: Insulin Pen Needle USE AS DIRECTED UP TO 4 TIMES DAILY. What changed: See the new instructions.   Insulin Syringe-Needle U-100 30G 1 ML Misc 1 each by Does not apply route daily. Use needle to inject insulin 3 times daily. What changed: Another medication with the same name was changed. Make sure you understand how and when to take each.   Sure Comfort Insulin Syringe 31G X 5/16" 0.5 ML Misc Generic drug: Insulin Syringe-Needle U-100 INJECT ONCE DAILY AS DIRECTED. What changed: See the new instructions.   midodrine 5 MG tablet Commonly known as: PROAMATINE Take 3 tablets (15 mg total) by mouth 3 (three) times daily with meals.   multivitamin Tabs tablet Take 1  tablet by mouth at bedtime.   NovoLOG FlexPen 100 UNIT/ML FlexPen Generic drug: insulin aspart INJECT SUBCUTANEOUSLY 12 UNITS BEFORE BREAKFAST; 14 UNITS AT LUNCH; AND 16 UNITS AT DINNER. What changed: See the new instructions.   traMADol 50 MG tablet Commonly known as: ULTRAM Take 1 tablet (50 mg total) by mouth every 12 (twelve) hours as needed for moderate pain. What changed:   when to take this  reasons to take this   Tresiba FlexTouch 200 UNIT/ML Sopn Generic drug: Insulin Degludec INJECT 56 UNITS INTO THE SKIN DAILY BEFORE BREAKFAST. What changed: See the new instructions.   Victoza 18 MG/3ML Sopn Generic drug: liraglutide INJECT 1.2MG SUBCUTANEOUSLY DAILY AS DIRECTED. What changed: See the new instructions.            Durable Medical Equipment  (From admission, onward)         Start     Ordered   03/29/19 1233  Heart failure home health orders  (Heart failure home health orders / Face to face)  Once    Comments: Heart Failure Follow-up Care:  Verify follow-up appointments per Patient Discharge Instructions. Confirm transportation arranged. Reconcile home medications with discharge medication list. Remove discontinued medications from use. Assist patient/caregiver to manage medications using pill box. Reinforce low sodium food selection Assessments: Vital signs and oxygen saturation at each visit. Assess home environment for safety concerns, caregiver support and availability of low-sodium foods. Consult Education officer, museum, PT/OT, Dietitian, and CNA based on assessments. Perform comprehensive cardiopulmonary assessment. Notify MD for any change in condition or weight gain of 3 pounds in one day or 5 pounds in one week with symptoms. Daily Weights and Symptom Monitoring: Ensure patient has access to scales. Teach patient/caregiver to weigh daily before breakfast and after voiding using same scale and record.    Teach patient/caregiver to track weight and symptoms and  when to notify Provider. Activity: Develop individualized activity plan with patient/caregiver.   Question Answer Comment  Heart Failure Follow-up Care [facility] ([facility]) [facility] at [phone]   Lab frequency Other see comments   Fax lab results to AHF Clinic at [phone]   Fluid restrictions: See other comments      03/29/19 1234   03/29/19 1232  For home use only DME Walker  Once    Question:  Patient needs a walker to treat with the following condition  Answer:  Heart failure ([place])   03/29/19 1231   03/29/19 1232  For home use only DME Bedside commode  Once  Question:  Patient needs a bedside commode to treat with the following condition  Answer:  Heart failure [facility])   03/29/19 1232          Disposition   The patient will be discharged in stable condition to home. Discharge Instructions    Diet - low sodium heart healthy   Complete by: As directed    Increase activity slowly   Complete by: As directed    Increase activity slowly   Complete by: As directed      Follow-up Information    Corliss Parish, MD Follow up in 1 week(s).   Specialty: Nephrology Contact information: [place] [address] [place], [facility] Follow up.   Why: Rol[place] Walker and Bedside Commode to be delivered to room prior to transiiton home.  Contact information: [address] [place] [address] [phone]        Larey Dresser, MD Follow up on April 18, 2019.   Specialty: Cardiology Why: at 2:00 [place] information: [place] [address] [phone]        Derek Jack, MD Follow up on 2019/04/18.   Specialty: Hematology Why: at 10:45  Contact information: [place] [address] [phone]             Duration of Discharge Encounter: Greater than 35 minutes   Signed, Amy Clegg NP-C  03/29/2019, 3:11 PM

## 2019-03-29 NOTE — TOC Initial Note (Signed)
Transition of Care [facility]) - Initial/Assessment Note    Patient Details  Name: [name] MRN: [id number] Date of Birth: January 13, 1961  Transition of Care [facility]) CM/SW Contact:    Bethena Roys, RN Phone Number: 03/29/2019, 2:32 PM  Clinical Narrative:  Patient presented for Acute on chronic systolic congestive heart failure. Prior to arrival patient was from home with support of spouse. Patient will have HD TTS schedule in the [place] office. Case Manager offered choice- patient has Medicaid and will receive limited services for Physical Therapy. Case Manager having a difficult time with finding an agency due to insurance. [facility] and [employer]. Case Manager has a call out to [facility] to see if they can assist. [place] and 3n1 will be delivered via [employer]. Case Manager will continue to follow for additional transition of care needs.                  Expected Discharge Plan: [place] Barriers to Discharge: No Barriers Identified   Patient Goals and CMS Choice Patient states their goals for this hospitalization and ongoing recovery are:: "to return home" CMS Medicare.gov Compare Post Acute Care list provided to:: Patient Choice offered to / list presented to : Patient  Expected Discharge Plan and Services Expected Discharge Plan: [place] In-house Referral: NA Discharge Planning Services: CM Consult Post Acute Care Choice: Home Health, Durable Medical Equipment   Expected Discharge Date: 03/29/19               DME Arranged: 3-N-1, Walker rolling DME Agency: [employer] Date DME Agency Contacted: 03/29/19 Time DME Agency Contacted: 9 Representative spoke with at DME Agency: Thedore Mins HH Arranged: RN, Disease Management, PT          Prior Living Arrangements/Services   Lives with:: Spouse Patient language and need for interpreter reviewed:: Yes Do you feel safe going back to  the place where you live?: Yes      Need for Family Participation in Patient Care: Yes (Comment) Care giver support system in place?: Yes (comment)   Criminal Activity/Legal Involvement Pertinent to Current Situation/Hospitalization: No - Comment as needed  Activities of Daily Living Home Assistive Devices/Equipment: None ADL Screening (condition at time of admission) Patient's cognitive ability adequate to safely complete daily activities?: Yes Is the patient deaf or have difficulty hearing?: No Does the patient have difficulty seeing, even when wearing glasses/contacts?: No Does the patient have difficulty concentrating, remembering, or making decisions?: No Patient able to express need for assistance with ADLs?: Yes Does the patient have difficulty dressing or bathing?: Yes Independently performs ADLs?: Yes (appropriate for developmental age) Does the patient have difficulty walking or climbing stairs?: Yes Weakness of Legs: Right Weakness of Arms/Hands: Left  Permission Sought/Granted Permission sought to share information with : Family Supports, Investment banker, corporate granted to share info w AGENCY: [employer]        Emotional Assessment Appearance:: Appears stated age Attitude/Demeanor/Rapport: Engaged Affect (typically observed): Appropriate Orientation: : Oriented to Situation, Oriented to  Time, Oriented to Place, Oriented to Self      Admission diagnosis:  Anasarca [R60.1] Acute on chronic systolic CHF (congestive heart failure), NYHA class 4 (HCC) [I50.23] Patient Active Problem List   Diagnosis Date Noted  . Pressure injury of skin 03/22/2019  . Palliative care by specialist   . Anasarca   . Weakness generalized   .  Acute on chronic systolic CHF (congestive heart failure), NYHA class 4 ([place]) 03/08/2019  . AKI (acute kidney injury) ([place]) 03/08/2019  . Thrombocytopenia ([place]) 02/04/2019  . Hepatic cirrhosis ([place]) 01/14/2019  .  Helicobacter pylori gastritis 10/31/2016  . Colon adenomas 06/15/2015  . Biventricular ICD (implantable cardioverter-defibrillator) in place 05/21/2013  . Other and unspecified hyperlipidemia 04/11/2013  . Chronic systolic heart failure ([place]) 04/07/2013  . DNR (do not resuscitate) discussion 03/29/2013  . Chronic kidney disease, stage IV (severe) ([place]) 01/27/2013  . Dyspepsia 09/10/2012  . Gout 05/15/2012  . Chronic anticoagulation   . Congenital third degree heart block   . 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 Cardiac pacemaker in situ 04/24/2009  . Type 2 diabetes mellitus with stage 5 chronic kidney disease ([place]) 01/20/2008  . CARDIOMYOPATHY 01/20/2008  . ATRIAL FIBRILLATION, CHRONIC 01/20/2008  . Cardiorenal syndrome/chronic kidney disease 01/20/2008   PCP:  [name], MD Pharmacy:   [place], [place] [address] Phone: [phone] Fax: [phone]     Social Determinants of Health (SDOH) Interventions    Readmission Risk Interventions Readmission Risk Prevention Plan 03/29/2019  Transportation Screening Complete  Medication Review Press photographer) Complete  HRI or [place] Complete  SW Recovery Care/Counseling Consult Complete  Palliative Care Screening Complete  [place] Patient Refused  Some recent data might be hidden

## 2019-03-29 NOTE — Progress Notes (Signed)
Patient ID: [name], female   DOB: 11/10/1960, 59 y.o.   MRN: [id number] P    Advanced Heart Failure Rounding Note  PCP-Cardiologist: Kate Sable, MD   Subjective:    She has been tolerating HD reasonably well.  SBP 100s this morning on midodrine 15 tid.   She and her husband want to get home as soon as possible and do not want CIR or SNF placement.    New diagnosis of multiple myeloma. Bx of lower abdomen was for possible calciphylaxis was negative (? false negative).   Echo: EF < 20%, no LVH, mildly reduced RV systolic function, severe biatrial enlargement, severe TR.  RHC Procedural Findings (dobutamine 2.5 mcg/kg/min): Hemodynamics (mmHg) RA 18 RV 43/18 PA 45/19, mean 28 PCWP mean 19 Oxygen saturations: PA 63% AO 94% Cardiac Output (Fick) 6.1  Cardiac Index (Fick) 3.06 PVR 1.5 WU PAPI 1.4  CT chest w/o contrast: Atelectasis, no significant pulmonary disease.    Objective:   Weight Range: 76.7 kg Body mass index is 27.28 kg/m.   Vital Signs:   Temp:  [97.5 F (36.4 C)-98.3 F (36.8 C)] 97.5 F (36.4 C) (02/01 0649) Pulse Rate:  [59-68] 60 (02/01 1030) Resp:  [18-26] 18 (02/01 0649) BP: (93-110)/(58-69) 108/64 (02/01 1030) SpO2:  [94 %-100 %] 96 % (02/01 0649) Weight:  [76.7 kg-79.4 kg] 76.7 kg (02/01 0649) Last BM Date: 03/28/19  Weight change: Filed Weights   03/27/19 0427 03/29/19 0420 03/29/19 0649  Weight: 56.2 kg 79.4 kg 76.7 kg    Intake/Output:   Intake/Output Summary (Last 24 hours) at 03/29/2019 1059 Last data filed at 03/28/2019 1800 Gross per 24 hour  Intake 590 ml  Output --  Net 590 ml      Physical Exam   General: NAD Neck: JVP 10 cm, no thyromegaly or thyroid nodule.  Lungs: Clear to auscultation bilaterally with normal respiratory effort. CV: Nondisplaced PMI.  Heart regular S1/S2, no S3/S4, no murmur. Trace ankle edema.    Abdomen: Soft, nontender, no hepatosplenomegaly, no distention.  Skin: Tender subcutaneous  nodules lower abdomen/upper legs.   Neurologic: Alert and oriented x 3.  Psych: Normal affect. Extremities: No clubbing or cyanosis.  HEENT: Normal.    Telemetry   A fib BiV pacing 60 Personally reviewed    Labs    CBC Recent Labs    03/28/19 0453 03/29/19 0723  WBC 11.7* 11.7*  HGB 8.8* 8.6*  HCT 25.7* 25.0*  MCV 80.8 79.9*  PLT 204 086   Basic Metabolic Panel Recent Labs    03/28/19 0453 03/29/19 0723  NA 123* 119*  K 4.6 5.5*  CL 82* 77*  CO2 24 22  GLUCOSE 270* 211*  BUN 59* 78*  CREATININE 5.51* 6.48*  CALCIUM 8.8* 8.9  PHOS 4.7* 6.2*   Liver Function Tests Recent Labs    03/28/19 0453 03/29/19 0723  ALBUMIN 1.9* 1.9*   No results for input(s): LIPASE, AMYLASE in the last 72 hours. Cardiac Enzymes No results for input(s): CKTOTAL, CKMB, CKMBINDEX, TROPONINI in the last 72 hours.  BNP: BNP (last 3 results) Recent Labs    03/07/19 2101  BNP 1,072.0*    ProBNP (last 3 results) No results for input(s): PROBNP in the last 8760 hours.   D-Dimer No results for input(s): DDIMER in the last 72 hours. Hemoglobin A1C No results for input(s): HGBA1C in the last 72 hours. Fasting Lipid Panel No results for input(s): CHOL, HDL, LDLCALC, TRIG, CHOLHDL, LDLDIRECT in the last 72  hours. Thyroid Function Tests No results for input(s): TSH, T4TOTAL, T3FREE, THYROIDAB in the last 72 hours.  Invalid input(s): FREET3  Other results:   Imaging    No results found.   Medications:     Scheduled Medications: . allopurinol  150 mg Oral q morning - 10a  . apixaban  5 mg Oral BID  . Chlorhexidine Gluconate Cloth  6 each Topical Q0600  . Chlorhexidine Gluconate Cloth  6 each Topical Q0600  . feeding supplement (NEPRO CARB STEADY)  237 mL Oral TID BM  . gabapentin  300 mg Oral Daily  . insulin aspart  0-15 Units Subcutaneous TID WC  . insulin aspart  0-5 Units Subcutaneous QHS  . insulin glargine  5 Units Subcutaneous Daily  . lidocaine (PF)  5 mL  Other Once  . midodrine  15 mg Oral TID WC  . multivitamin  1 tablet Oral QHS  . polyethylene glycol  17 g Oral BID  . senna  1 tablet Oral Daily  . sodium chloride flush  10-40 mL Intracatheter Q12H  . sodium chloride flush  3 mL Intravenous Q12H  . vitamin B-12  100 mcg Oral Daily    Infusions: . sodium chloride 250 mL (03/28/19 1042)  . sodium chloride    . sodium chloride      PRN Medications: sodium chloride, sodium chloride, sodium chloride, acetaminophen **OR** acetaminophen, alteplase, heparin, heparin, lidocaine (PF), lidocaine-prilocaine, ondansetron **OR** ondansetron (ZOFRAN) IV, pentafluoroprop-tetrafluoroeth, sodium chloride, sodium chloride flush, sodium chloride flush, traMADol    Assessment/Plan   1. Acute Hypoxic Respiratory Failure: Resolved, off oxygen. 2. 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  Last echo in 2/19 with EF 15-20%. She has a Chemical [employer] CRT-D device. Admitted with progressive dyspnea and 30 lb weight gain. Empirically started on dobutamine 2.5 due to concern for low output. RHC showed R>L heart failure with low PAPI adequate cardiac output on dobutamine. Now off dobutamine.  Initially started on iHD after she failed high dose diuretics, then CVVH with intractable volume overload.  Weight down 50 lbs from baseline, now off CVVH.  She will be HD dependent.   - Continue midodrine 15 mg tid with relative hypotension and need for iHD.  - HD MWF.  - Stopped digoxin and benazepril with AKI.  - Stopped beta blocker with concern for low output.  - No BP room for hydralazine/nitrates.  - Cardiac amyloidosis is a concern with myeloma, but echo myocardial appearance is not consistent. BM biopsy negative for amyloidosis.  - LE u/s negative for DVT 2. AKI on CKD stage 4: Baseline creatinine around 3, went up to 5 with BUN 150 this admission. Suspect cardiorenal syndrome superimposed on possible diabetic nephropathy. She will be  dialysis-dependent going forward. - HD MWF  - She now has a tunneled catheter.  - If she continues to tolerate HD as an outpatient, will need to see vascular for permanent access.  - Nephrology assistance appreciated.    3. Chronic atrial fibrillation: Warfarin held for procedures, now on heparin gtt. Long-term, will transition to apixaban with some concern for warfarin skin necrosis.    - Continue apixaban.  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  5. DM: Per primary.  6. Monoclonal gammopathy:  LV morphology does not look consistent with AL amyloidosis. She has been seen by hematology, has had bone marrow bx showing 14% plasma cells by flow cytometry on the aspirate which is by definition multiple myeloma. Seen by heme/onc, plan for  outpatient treatment.  BM bx negative for amyloidosis.  7. Tricuspid regurgitation: Severe by echo.  8.  Subcutaneous nodules: ?calciphylaxis versus less likely warfarin skin necrosis.  - Off warfarin, will use Eliquis in future.  - Bx negative for calciphylaxis. (? False negative)  - Renal managing for possible calciphylaxis.  9. Hyponatremia: Na lower at 119 today, will need to fluid restrict.  To have HD today, recheck BMET after HD and if NA is improved, can likely still go home.  10. Deconditioning: CIR vs SNF recommended, she insists on going home.  Will need home PT.  11. Poor prognosis.  Not sure she will tolerate iHD with relative hypotension.  Palliative care following, she is now a limited code.    BMET after HD, if Na improved think she can go home if outpatient HD is arranged.  Will need followup with heme/onc in [place] for multiple myeloma Erlene Quan), followup CHF clinic.   Length of Stay: 5  Loralie Champagne, MD  03/29/2019, 10:59 AM  Advanced Heart Failure Team Pager [phone] (M-F; 7a - 4p)  Please contact [facility] for night-coverage after hours (4p -7a ) and weekends on [email or web address]

## 2019-03-29 NOTE — Progress Notes (Signed)
   Has HD plan Tues/THus/Sat at 11:45   Renal navigator following and will update Mr and Mrs Francine Hannan NP-C  12:36 PM

## 2019-03-29 NOTE — Discharge Instructions (Signed)

## 2019-03-29 NOTE — TOC Transition Note (Signed)
Transition of Care [facility]) - CM/SW Discharge Note   Patient Details  Name: AYEN VIVIANO MRN: [id number] Date of Birth: [birth year]  [facility] CM/SW Contact:  Bethena Roys, RN Phone Number: 03/29/2019, 3:17 PM   Clinical Narrative:   [place] was able to accept the patient for RN and PT services. Agency will call the patient with a visit time. Eliquis should be no more than $3.00 for cost. Patient received the 30 day free card. Per husband he will transport the patient home. No further needs from Case Manager at this time.     Final next level of care: [place] Barriers to Discharge: No Barriers Identified   Patient Goals and CMS Choice Patient states their goals for this hospitalization and ongoing recovery are:: "to return home" CMS Medicare.gov Compare Post Acute Care list provided to:: Patient Choice offered to / list presented to : Patient   Discharge Plan and Services In-house Referral: NA Discharge Planning Services: CM Consult Post Acute Care Choice: Home Health, Durable Medical Equipment          DME Arranged: 3-N-1, Walker rolling DME Agency: [employer] Date DME Agency Contacted: 03/29/19 Time DME Agency Contacted: 13 Representative spoke with at DME Agency: Thedore Mins HH Arranged: RN, Disease Management, PT [place] Agency: [employer] ([place]) Date [place]: 03/29/19 Time [place]: 1516 Representative spoke with at [place]: Mateo Flow   Readmission Risk Interventions Readmission Risk Prevention Plan 03/29/2019  Transportation Screening Complete  Medication Review Press photographer) Complete  HRI or [place] Complete  SW Recovery Care/Counseling Consult Complete  [place] Patient Refused  Some recent data might be hidden

## 2019-03-30 ENCOUNTER — Telehealth: Payer: Self-pay | Admitting: Nephrology

## 2019-03-30 ENCOUNTER — Ambulatory Visit (HOSPITAL_COMMUNITY): Payer: Medicaid Other | Admitting: Hematology

## 2019-03-30 ENCOUNTER — Other Ambulatory Visit: Payer: Self-pay | Admitting: Endocrinology

## 2019-03-30 NOTE — Telephone Encounter (Signed)
Transition of Care Contact from [place]   Date of Discharge: 03/29/19 Date of Contact: 03/30/19 Method of contact: phone Talked to patient & husband   Patient contacted to discuss transition of care form recent hospitaliztion. 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   Medication changes were reviewed, multiple medications d/c including benazepril, coreg, digoxin, metolazone, torsemide, warfarin, meclizine, calcitriol and Vit D.  Started on midodrine, advised to take dose 30 min prior to HD.  Patient confused about insulin dosage, reviewed doses listed on d/c and advised patient to contact provider who manages insulin for further instructions.     Patient will follow up with is outpatient dialysis center 04/02/19.   Other follow up needs include walker & bedside commode, f/u visit with Cardiology and hematology.    Jen Mow, PA-C [facility] Pager: [phone]

## 2019-04-02 ENCOUNTER — Inpatient Hospital Stay (HOSPITAL_COMMUNITY)
Admission: EM | Admit: 2019-04-02 | Discharge: 2019-04-05 | DRG: 291 | Disposition: A | Discharge status: Hospice | Payer: Medicaid Other | Attending: Internal Medicine | Admitting: Internal Medicine

## 2019-04-02 ENCOUNTER — Emergency Department (HOSPITAL_COMMUNITY): Payer: Medicaid Other

## 2019-04-02 ENCOUNTER — Other Ambulatory Visit: Payer: Self-pay

## 2019-04-02 DIAGNOSIS — Z794 Long term (current) use of insulin: Secondary | ICD-10-CM

## 2019-04-02 DIAGNOSIS — E1122 Type 2 diabetes mellitus with diabetic chronic kidney disease: Secondary | ICD-10-CM | POA: Diagnosis present

## 2019-04-02 DIAGNOSIS — Z833 Family history of diabetes mellitus: Secondary | ICD-10-CM

## 2019-04-02 DIAGNOSIS — Z515 Encounter for palliative care: Secondary | ICD-10-CM

## 2019-04-02 DIAGNOSIS — I1310 Hypertensive heart and chronic kidney disease without heart failure, with stage 1 through stage 4 chronic kidney disease, or unspecified chronic kidney disease: Secondary | ICD-10-CM | POA: Diagnosis present

## 2019-04-02 DIAGNOSIS — D631 Anemia in chronic kidney disease: Secondary | ICD-10-CM | POA: Diagnosis present

## 2019-04-02 DIAGNOSIS — Q246 Congenital heart block: Secondary | ICD-10-CM

## 2019-04-02 DIAGNOSIS — I5023 Acute on chronic systolic (congestive) heart failure: Secondary | ICD-10-CM | POA: Diagnosis present

## 2019-04-02 DIAGNOSIS — I9589 Other hypotension: Secondary | ICD-10-CM | POA: Diagnosis present

## 2019-04-02 DIAGNOSIS — R601 Generalized edema: Secondary | ICD-10-CM

## 2019-04-02 DIAGNOSIS — Z91040 Latex allergy status: Secondary | ICD-10-CM

## 2019-04-02 DIAGNOSIS — Z91018 Allergy to other foods: Secondary | ICD-10-CM

## 2019-04-02 DIAGNOSIS — Z20822 Contact with and (suspected) exposure to covid-19: Secondary | ICD-10-CM | POA: Diagnosis present

## 2019-04-02 DIAGNOSIS — I509 Heart failure, unspecified: Secondary | ICD-10-CM

## 2019-04-02 DIAGNOSIS — Z882 Allergy status to sulfonamides status: Secondary | ICD-10-CM

## 2019-04-02 DIAGNOSIS — J9601 Acute respiratory failure with hypoxia: Secondary | ICD-10-CM | POA: Diagnosis present

## 2019-04-02 DIAGNOSIS — I428 Other cardiomyopathies: Secondary | ICD-10-CM | POA: Diagnosis present

## 2019-04-02 DIAGNOSIS — I442 Atrioventricular block, complete: Secondary | ICD-10-CM | POA: Diagnosis present

## 2019-04-02 DIAGNOSIS — I132 Hypertensive heart and chronic kidney disease with heart failure and with stage 5 chronic kidney disease, or end stage renal disease: Principal | ICD-10-CM | POA: Diagnosis present

## 2019-04-02 DIAGNOSIS — Z992 Dependence on renal dialysis: Secondary | ICD-10-CM

## 2019-04-02 DIAGNOSIS — Z66 Do not resuscitate: Secondary | ICD-10-CM | POA: Diagnosis not present

## 2019-04-02 DIAGNOSIS — I4891 Unspecified atrial fibrillation: Secondary | ICD-10-CM | POA: Diagnosis present

## 2019-04-02 DIAGNOSIS — Z7901 Long term (current) use of anticoagulants: Secondary | ICD-10-CM

## 2019-04-02 DIAGNOSIS — N186 End stage renal disease: Secondary | ICD-10-CM | POA: Diagnosis present

## 2019-04-02 DIAGNOSIS — Z95810 Presence of automatic (implantable) cardiac defibrillator: Secondary | ICD-10-CM

## 2019-04-02 DIAGNOSIS — Z880 Allergy status to penicillin: Secondary | ICD-10-CM

## 2019-04-02 DIAGNOSIS — E8770 Fluid overload, unspecified: Secondary | ICD-10-CM

## 2019-04-02 DIAGNOSIS — Z79899 Other long term (current) drug therapy: Secondary | ICD-10-CM

## 2019-04-02 DIAGNOSIS — Z8673 Personal history of transient ischemic attack (TIA), and cerebral infarction without residual deficits: Secondary | ICD-10-CM

## 2019-04-02 DIAGNOSIS — N2581 Secondary hyperparathyroidism of renal origin: Secondary | ICD-10-CM | POA: Diagnosis present

## 2019-04-02 DIAGNOSIS — C9000 Multiple myeloma not having achieved remission: Secondary | ICD-10-CM | POA: Diagnosis present

## 2019-04-02 DIAGNOSIS — E43 Unspecified severe protein-calorie malnutrition: Secondary | ICD-10-CM | POA: Diagnosis present

## 2019-04-02 DIAGNOSIS — I482 Chronic atrial fibrillation, unspecified: Secondary | ICD-10-CM | POA: Diagnosis present

## 2019-04-02 HISTORY — DX: End stage renal disease: N18.6

## 2019-04-02 HISTORY — DX: Localized swelling, mass and lump, unspecified: R22.9

## 2019-04-02 HISTORY — DX: Monoclonal gammopathy: D47.2

## 2019-04-02 LAB — BASIC METABOLIC PANEL
Anion gap: 22 — ABNORMAL HIGH (ref 5–15)
BUN: 62 mg/dL — ABNORMAL HIGH (ref 6–20)
CO2: 19 mmol/L — ABNORMAL LOW (ref 22–32)
Calcium: 8.5 mg/dL — ABNORMAL LOW (ref 8.9–10.3)
Chloride: 86 mmol/L — ABNORMAL LOW (ref 98–111)
Creatinine, Ser: 6.18 mg/dL — ABNORMAL HIGH (ref 0.44–1.00)
GFR calc Af Amer: 8 mL/min — ABNORMAL LOW (ref >60)
GFR calc non Af Amer: 7 mL/min — ABNORMAL LOW (ref >60)
Glucose, Bld: 226 mg/dL — ABNORMAL HIGH (ref 70–99)
Potassium: 5 mmol/L (ref 3.5–5.1)
Sodium: 127 mmol/L — ABNORMAL LOW (ref 135–145)

## 2019-04-02 LAB — CBC
HCT: 27.3 % — ABNORMAL LOW (ref 36.0–46.0)
Hemoglobin: 8.8 g/dL — ABNORMAL LOW (ref 12.0–15.0)
MCH: 26.8 pg (ref 26.0–34.0)
MCHC: 32.2 g/dL (ref 30.0–36.0)
MCV: 83.2 fL (ref 80.0–100.0)
Platelets: 300 10*3/uL (ref 150–400)
RBC: 3.28 MIL/uL — ABNORMAL LOW (ref 3.87–5.11)
RDW: 22 % — ABNORMAL HIGH (ref 11.5–15.5)
WBC: 14.6 10*3/uL — ABNORMAL HIGH (ref 4.0–10.5)
nRBC: 0.9 % — ABNORMAL HIGH (ref 0.0–0.2)

## 2019-04-02 NOTE — ED Triage Notes (Signed)
Pt c/o generalized swelling. New to dialysis and has only had one treatment.

## 2019-04-02 NOTE — ED Triage Notes (Signed)
Pt arrives via [facility] from home with c/o abdominal swelling. Pt was recently in the hospital for CHF and renal failure. en route, vitals 112/71, hr 70, 100% RA. Pt alert and oriented.

## 2019-04-03 ENCOUNTER — Encounter (HOSPITAL_COMMUNITY): Payer: Self-pay | Admitting: Internal Medicine

## 2019-04-03 DIAGNOSIS — I442 Atrioventricular block, complete: Secondary | ICD-10-CM | POA: Diagnosis present

## 2019-04-03 DIAGNOSIS — E43 Unspecified severe protein-calorie malnutrition: Secondary | ICD-10-CM | POA: Diagnosis present

## 2019-04-03 DIAGNOSIS — Z20822 Contact with and (suspected) exposure to covid-19: Secondary | ICD-10-CM | POA: Diagnosis present

## 2019-04-03 DIAGNOSIS — N186 End stage renal disease: Secondary | ICD-10-CM | POA: Diagnosis not present

## 2019-04-03 DIAGNOSIS — I132 Hypertensive heart and chronic kidney disease with heart failure and with stage 5 chronic kidney disease, or end stage renal disease: Secondary | ICD-10-CM | POA: Diagnosis present

## 2019-04-03 DIAGNOSIS — R601 Generalized edema: Secondary | ICD-10-CM | POA: Diagnosis present

## 2019-04-03 DIAGNOSIS — Z95810 Presence of automatic (implantable) cardiac defibrillator: Secondary | ICD-10-CM | POA: Diagnosis not present

## 2019-04-03 DIAGNOSIS — C9000 Multiple myeloma not having achieved remission: Secondary | ICD-10-CM | POA: Diagnosis present

## 2019-04-03 DIAGNOSIS — Z91040 Latex allergy status: Secondary | ICD-10-CM | POA: Diagnosis not present

## 2019-04-03 DIAGNOSIS — Z66 Do not resuscitate: Secondary | ICD-10-CM | POA: Diagnosis not present

## 2019-04-03 DIAGNOSIS — Z992 Dependence on renal dialysis: Secondary | ICD-10-CM | POA: Diagnosis not present

## 2019-04-03 DIAGNOSIS — Z515 Encounter for palliative care: Secondary | ICD-10-CM | POA: Diagnosis not present

## 2019-04-03 DIAGNOSIS — I509 Heart failure, unspecified: Secondary | ICD-10-CM

## 2019-04-03 DIAGNOSIS — Z880 Allergy status to penicillin: Secondary | ICD-10-CM | POA: Diagnosis not present

## 2019-04-03 DIAGNOSIS — Z7901 Long term (current) use of anticoagulants: Secondary | ICD-10-CM | POA: Diagnosis not present

## 2019-04-03 DIAGNOSIS — Z833 Family history of diabetes mellitus: Secondary | ICD-10-CM | POA: Diagnosis not present

## 2019-04-03 DIAGNOSIS — I9589 Other hypotension: Secondary | ICD-10-CM | POA: Diagnosis present

## 2019-04-03 DIAGNOSIS — I5023 Acute on chronic systolic (congestive) heart failure: Secondary | ICD-10-CM | POA: Diagnosis not present

## 2019-04-03 DIAGNOSIS — Z8673 Personal history of transient ischemic attack (TIA), and cerebral infarction without residual deficits: Secondary | ICD-10-CM | POA: Diagnosis not present

## 2019-04-03 DIAGNOSIS — J9601 Acute respiratory failure with hypoxia: Secondary | ICD-10-CM | POA: Diagnosis present

## 2019-04-03 DIAGNOSIS — Z79899 Other long term (current) drug therapy: Secondary | ICD-10-CM | POA: Diagnosis not present

## 2019-04-03 DIAGNOSIS — I482 Chronic atrial fibrillation, unspecified: Secondary | ICD-10-CM | POA: Diagnosis present

## 2019-04-03 DIAGNOSIS — E1122 Type 2 diabetes mellitus with diabetic chronic kidney disease: Secondary | ICD-10-CM | POA: Diagnosis present

## 2019-04-03 DIAGNOSIS — N2581 Secondary hyperparathyroidism of renal origin: Secondary | ICD-10-CM | POA: Diagnosis present

## 2019-04-03 DIAGNOSIS — I428 Other cardiomyopathies: Secondary | ICD-10-CM | POA: Diagnosis present

## 2019-04-03 DIAGNOSIS — D631 Anemia in chronic kidney disease: Secondary | ICD-10-CM | POA: Diagnosis present

## 2019-04-03 LAB — CBG MONITORING, ED
Glucose-Capillary: 199 mg/dL — ABNORMAL HIGH (ref 70–99)
Glucose-Capillary: 248 mg/dL — ABNORMAL HIGH (ref 70–99)

## 2019-04-03 LAB — HEPATIC FUNCTION PANEL
ALT: 40 U/L (ref 0–44)
AST: 52 U/L — ABNORMAL HIGH (ref 15–41)
Albumin: 2.1 g/dL — ABNORMAL LOW (ref 3.5–5.0)
Alkaline Phosphatase: 422 U/L — ABNORMAL HIGH (ref 38–126)
Bilirubin, Direct: 4.1 mg/dL — ABNORMAL HIGH (ref 0.0–0.2)
Indirect Bilirubin: 2.9 mg/dL — ABNORMAL HIGH (ref 0.3–0.9)
Total Bilirubin: 7 mg/dL — ABNORMAL HIGH (ref 0.3–1.2)
Total Protein: 7.5 g/dL (ref 6.5–8.1)

## 2019-04-03 LAB — SARS CORONAVIRUS 2 (TAT 6-24 HRS): SARS Coronavirus 2: NEGATIVE

## 2019-04-03 LAB — BRAIN NATRIURETIC PEPTIDE: B Natriuretic Peptide: 1922.6 pg/mL — ABNORMAL HIGH (ref 0.0–100.0)

## 2019-04-03 LAB — GLUCOSE, CAPILLARY: Glucose-Capillary: 118 mg/dL — ABNORMAL HIGH (ref 70–99)

## 2019-04-03 MED ORDER — TRAMADOL HCL 50 MG PO TABS
50.0000 mg | ORAL_TABLET | Freq: Two times a day (BID) | ORAL | Status: DC | PRN
  Administered 2019-04-03 – 2019-04-05 (×4): 50 mg via ORAL
  Filled 2019-04-03 (×4): qty 1

## 2019-04-03 MED ORDER — FUROSEMIDE 10 MG/ML IJ SOLN
20.0000 mg | Freq: Once | INTRAMUSCULAR | Status: AC
  Administered 2019-04-03: 20 mg via INTRAVENOUS
  Filled 2019-04-03: qty 2

## 2019-04-03 MED ORDER — ALLOPURINOL 300 MG PO TABS
150.0000 mg | ORAL_TABLET | Freq: Every day | ORAL | Status: DC
  Administered 2019-04-03 – 2019-04-05 (×3): 150 mg via ORAL
  Filled 2019-04-03 (×2): qty 1
  Filled 2019-04-03: qty 2

## 2019-04-03 MED ORDER — MIDODRINE HCL 5 MG PO TABS
15.0000 mg | ORAL_TABLET | Freq: Three times a day (TID) | ORAL | Status: DC
  Administered 2019-04-03 – 2019-04-05 (×6): 15 mg via ORAL
  Filled 2019-04-03 (×7): qty 3

## 2019-04-03 MED ORDER — ALBUMIN HUMAN 25 % IV SOLN
12.5000 g | INTRAVENOUS | Status: DC | PRN
  Filled 2019-04-03: qty 50

## 2019-04-03 MED ORDER — RENA-VITE PO TABS
1.0000 | ORAL_TABLET | Freq: Every day | ORAL | Status: DC
  Administered 2019-04-03 – 2019-04-04 (×2): 1 via ORAL
  Filled 2019-04-03 (×3): qty 1

## 2019-04-03 MED ORDER — SODIUM CHLORIDE 0.9 % IV SOLN: 100.0000 mL | INTRAVENOUS | Status: DC | PRN

## 2019-04-03 MED ORDER — CHLORHEXIDINE GLUCONATE CLOTH 2 % EX PADS: 6.0000 | MEDICATED_PAD | Freq: Every day | CUTANEOUS | Status: DC

## 2019-04-03 MED ORDER — LIDOCAINE HCL (PF) 1 % IJ SOLN: 5.0000 mL | INTRAMUSCULAR | Status: DC | PRN

## 2019-04-03 MED ORDER — PENTAFLUOROPROP-TETRAFLUOROETH EX AERO: 1.0000 "application " | INHALATION_SPRAY | CUTANEOUS | Status: DC | PRN

## 2019-04-03 MED ORDER — ALTEPLASE 2 MG IJ SOLR: 2.0000 mg | Freq: Once | INTRAMUSCULAR | Status: DC | PRN

## 2019-04-03 MED ORDER — LIDOCAINE-PRILOCAINE 2.5-2.5 % EX CREA
1.0000 "application " | TOPICAL_CREAM | CUTANEOUS | Status: DC | PRN
  Filled 2019-04-03: qty 5

## 2019-04-03 MED ORDER — ONDANSETRON HCL 4 MG PO TABS: 4.0000 mg | ORAL_TABLET | Freq: Four times a day (QID) | ORAL | Status: DC | PRN

## 2019-04-03 MED ORDER — ALBUMIN HUMAN 25 % IV SOLN
INTRAVENOUS | Status: AC
  Administered 2019-04-03: 16:00:00 12.5 g via INTRAVENOUS
  Filled 2019-04-03: qty 100

## 2019-04-03 MED ORDER — HEPARIN SODIUM (PORCINE) 1000 UNIT/ML IJ SOLN
INTRAMUSCULAR | Status: AC
  Administered 2019-04-03: 3200 [IU] via INTRAVENOUS_CENTRAL
  Filled 2019-04-03: qty 4

## 2019-04-03 MED ORDER — HEPARIN SODIUM (PORCINE) 1000 UNIT/ML DIALYSIS
1000.0000 [IU] | INTRAMUSCULAR | Status: DC | PRN
  Filled 2019-04-03: qty 1

## 2019-04-03 MED ORDER — ACETAMINOPHEN 325 MG PO TABS
650.0000 mg | ORAL_TABLET | Freq: Four times a day (QID) | ORAL | Status: DC | PRN
  Administered 2019-04-03: 22:00:00 325 mg via ORAL
  Filled 2019-04-03: qty 2

## 2019-04-03 MED ORDER — GABAPENTIN 300 MG PO CAPS
300.0000 mg | ORAL_CAPSULE | Freq: Every day | ORAL | Status: DC
  Administered 2019-04-03 – 2019-04-05 (×3): 300 mg via ORAL
  Filled 2019-04-03 (×3): qty 1

## 2019-04-03 MED ORDER — ONDANSETRON HCL 4 MG/2ML IJ SOLN: 4.0000 mg | Freq: Four times a day (QID) | INTRAMUSCULAR | Status: DC | PRN

## 2019-04-03 MED ORDER — HEPARIN SODIUM (PORCINE) 1000 UNIT/ML DIALYSIS
20.0000 [IU]/kg | INTRAMUSCULAR | Status: DC | PRN
  Filled 2019-04-03 (×2): qty 2

## 2019-04-03 MED ORDER — APIXABAN 5 MG PO TABS
5.0000 mg | ORAL_TABLET | Freq: Two times a day (BID) | ORAL | Status: DC
  Administered 2019-04-03 – 2019-04-05 (×5): 5 mg via ORAL
  Filled 2019-04-03 (×6): qty 1

## 2019-04-03 MED ORDER — ACETAMINOPHEN 650 MG RE SUPP: 650.0000 mg | Freq: Four times a day (QID) | RECTAL | Status: DC | PRN

## 2019-04-03 MED ORDER — INSULIN GLARGINE 100 UNIT/ML ~~LOC~~ SOLN
56.0000 [IU] | Freq: Every day | SUBCUTANEOUS | Status: DC
  Administered 2019-04-03: 11:00:00 56 [IU] via SUBCUTANEOUS
  Filled 2019-04-03 (×2): qty 0.56

## 2019-04-03 MED ORDER — INSULIN ASPART 100 UNIT/ML ~~LOC~~ SOLN
0.0000 [IU] | Freq: Three times a day (TID) | SUBCUTANEOUS | Status: DC
  Administered 2019-04-03: 3 [IU] via SUBCUTANEOUS
  Administered 2019-04-03: 2 [IU] via SUBCUTANEOUS
  Administered 2019-04-04: 18:00:00 1 [IU] via SUBCUTANEOUS
  Administered 2019-04-04 – 2019-04-05 (×2): 2 [IU] via SUBCUTANEOUS

## 2019-04-03 NOTE — Progress Notes (Addendum)
[facility] Progress Note   Subjective:   Patient seen and examined at bedside.  Reports worsened LE edema, abdominal pain and weakness in her legs prompting her to come to the ED.  Last dialysis on Thursday 2/4.  Denies SOB, CP, n/v/d, dizziness and fatigue.    Just discharged on 2/1 following hospitalization due to volume overload, AOC CHF, AKI on CKD now ESRD, initially started on CRRT, then transitioned to HD on 03/24/19.   Inpatient rehab or SNF was recommended but patient insisted on being discharge home with home health.   Objective Vitals:   04/03/19 1130 04/03/19 1145 04/03/19 1200 04/03/19 1230  BP: 104/60 (!) 98/55 102/62 (!) 92/52  Pulse: (!) 59 (!) 59 (!) 58 (!) 59  Resp: (!) 25 (!) 24 20 (!) 21  Temp:      TempSrc:      SpO2: 100% 98% 99% 98%   Physical Exam General:NAD, chronically ill appearing female, laying in bed.  Heart:RRR, +2/7 systolic murmur Lungs:mostly CTAB, decreased in bases Abdomen:firm with orange peel appearance, +tenderness,  Extremities:3+LE edema from waist to toes Dialysis Access: [facility]    Additional Objective Labs: Basic Metabolic Panel: Recent Labs  Lab 03/28/19 0453 03/28/19 0453 03/29/19 0723 03/29/19 1231 04/02/19 2118  NA 123*   < > 119* 128* 127*  K 4.6   < > 5.5* 3.9 5.0  CL 82*   < > 77* 89* 86*  CO2 24   < > 22 22 19*  GLUCOSE 270*   < > 211* 134* 226*  BUN 59*   < > 78* 21* 62*  CREATININE 5.51*   < > 6.48* 2.75* 6.18*  CALCIUM 8.8*   < > 8.9 8.6* 8.5*  PHOS 4.7*  --  6.2*  --   --    < > = values in this interval not displayed.   Liver Function Tests: Recent Labs  Lab 03/28/19 0453 03/29/19 0723 04/03/19 0040  AST  --   --  52*  ALT  --   --  40  ALKPHOS  --   --  422*  BILITOT  --   --  7.0*  PROT  --   --  7.5  ALBUMIN 1.9* 1.9* 2.1*   CBC: Recent Labs  Lab 03/28/19 0453 03/29/19 0723 04/02/19 2118  WBC 11.7* 11.7* 14.6*  HGB 8.8* 8.6* 8.8*  HCT 25.7* 25.0* 27.3*  MCV 80.8 79.9* 83.2   PLT 204 237 300   CBG: Recent Labs  Lab 03/28/19 2104 03/29/19 1312 03/29/19 1548 04/03/19 0756 04/03/19 1208  GLUCAP 184* 138* 136* 199* 248*   Studies/Results: DG Chest 2 View  Result Date: 04/02/2019 CLINICAL DATA:  59 year female with shortness of breath and fluid overload. EXAM: CHEST - 2 VIEW COMPARISON:  Chest radiograph dated 03/18/2019. FINDINGS: Right-sided dialysis catheter in similar position. There is cardiomegaly. No vascular congestion or edema. No focal consolidation, pleural effusion, or pneumothorax. Atherosclerotic calcification of the aorta. Left pectoral AICD device. No acute osseous pathology. IMPRESSION: 1. No acute cardiopulmonary process. 2. Cardiomegaly. 3.  Aortic Atherosclerosis (ICD10-I70.0). Electronically Signed   By: Anner Crete M.D.   On: 04/02/2019 21:52    Medications: . albumin human     . allopurinol  150 mg Oral Daily  . apixaban  5 mg Oral BID  . Chlorhexidine Gluconate Cloth  6 each Topical Q0600  . gabapentin  300 mg Oral Daily  . insulin aspart  0-9 Units Subcutaneous TID WC  .  insulin glargine  56 Units Subcutaneous Daily  . midodrine  15 mg Oral TID WC  . multivitamin  1 tablet Oral QHS    Dialysis Orders: TTS - [place] 3.5hrs->4hrs 300/500->400/800 2K 2Ca Profile 4 TDC Hep 5700 unit bolus  Assessment/Plan: 1. Anasarca  - significant edema in lower body.  CXR shows no pulmonary edema.  Plan for HD to with hopefully net volume removal of 3L.  Significant hypotension limits UF, on 83m midodrine TID. Will use albumin and midodrine to help facilitate volume removal.  2. Acute on chronic systolic HF/NICM - significant disease and difficultly with medical management due to low output, hypotension and renal disease.  Very poor prognosis.  Per cardio best served with hospice.  2. ESRD - On HD TTS, new start.  Orders written for HD today per regular schedule.  May require serial dialysis but UF will be very difficult due to  hypotension.   3. Anemia of CKD- Hgb 8.8. Not on ESA currently. Follow trends.  4. Secondary hyperparathyroidism - Ca ok.  Will check phos. Not on VDRA/binders. 5. Hypotension - Chronic. On midodrine 163mTID.  Will give does mid HD if needed.  Albumin with HD for BP support.  6. Nutrition - Renal diet w/fluid restrictions 7. Multiple myeloma 8. Warfarin skin necrosis - punch bx neg for calciphylaxis  LiJen MowPA-C [facility] Pager: [phone]/07/2019,1:09 PM  LOS: 0 days   Nephrology attending: Patient was seen and examined in ER.  Chart reviewed.  I agree with the assessment and plan as outlined above.  Also discussed with heart failure team.  [age]ear old female with nonischemic cardiomyopathy with EF less than 20%, recent prolonged hospitalization requiring CRRT and then switched to IHD, new ESRD, chronic hypotension, DM, with significant fluid overload and decompensated heart failure. Patient has hypotension, fluid overload. Attempt ultrafiltration during dialysis today by lowering dialysate temperature, albumin use and midodrine. I am worried that patient may not be able to tolerate dialysis.  She will need palliative consult evaluation.  Heart failure team recommend supportive care and not candidate for advanced heart failure therapies.  Agree with hospice care. Right IJ for the access.  DrLawson RadarMD [facility].

## 2019-04-03 NOTE — ED Notes (Signed)
Lunch Ordered @1028 .

## 2019-04-03 NOTE — ED Notes (Signed)
Pt placed on 2L for comfort, O2 sats 100%

## 2019-04-03 NOTE — H&P (Signed)
History and Physical    [name] IEP:[id number] DOB: [birth year] DOA: 04/02/2019  PCP: [name], MD  Patient coming from: Home.  Chief Complaint: Lower extremity and abdominal edema.  HPI: [name] is a 59 y.o. 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  Denies any chest pain shortness of breath productive cough.  Denies any fever chills.  ED Course: In the ER on exam patient has significant swelling of both lower extremity extending up to the thighs with abdominal distention.  Patient is afebrile Covid test is pending.  EKG shows paced rhythm.  Chest x-ray does not show any infiltrates does show cardiomegaly.  Labs show sodium 127 albumin 2.1 creatinine 6.18 potassium 5 WBC 14.6 hemoglobin 8.8 platelets 300 BNP 1900 patient was given a small dose of Lasix 20 mg IV admitted for further management of decompensated CHF with ESRD.  Review of Systems: As per HPI, rest all negative.   Past Medical History:  Diagnosis Date  . Automatic implantable cardioverter-defibrillator in situ    a. 03/2013: BSX Energen CRTD BiV ICC, ser# [id number]  . Chronic anticoagulation    a. coumadin.  . Chronic atrial fibrillation (HCC)    embolic rind  . Chronic kidney disease    creatinine- 1.8 in 09/2006 and 2.0 in 05/2007; 2.05 in 2011, 2.29 in 2012  . Chronic systolic CHF (congestive heart failure) ([place])    a. 03/2013 Echo: Sev LV dysfxn with sev diff HK, restrictive phys, diast dysfxn, mild MR, sev dil LA/RA, mod PR, PASP 73mmHg.  . Congenital  third degree heart block    a. Guidant VVI pacemaker implanted in 09/1997; b. gen change in 01/2004;  c. 03/2013 upgrade to [place], ser# [id number].  . Dizziness and giddiness 05/19/2012  . Dysphagia 08/14/2011   FEB 2013 EGD/DIL 16 MM; GERD; h/o gastroesophageal reflux disease; + H. Pylori gastritis   . GERD (gastroesophageal reflux disease)   . Helicobacter pylori gastritis 08/14/2011  . IDDM (insulin dependent diabetes mellitus)   . Kidney stones 1990's  . Nonischemic cardiomyopathy ([place])    a. 03/2013 Echo: Sev LV dysfxn with sev diff HK, restrictive phys, diast dysfxn, mild MR, sev dil LA/RA, mod PR, PASP 19mmHg;  b. 03/2013: BSX Energen CRTD BiV ICC, ser# [id number].  Marland Kitchen Potassium (K) excess   . Stroke [facility]) 1999   denies residual on 04/21/2013    Past Surgical History:  Procedure Laterality Date  . BI-VENTRICULAR IMPLANTABLE CARDIOVERTER DEFIBRILLATOR  (CRT-D)  04/21/2013  . BI-VENTRICULAR IMPLANTABLE CARDIOVERTER DEFIBRILLATOR UPGRADE N/A 04/21/2013   Procedure: BI-VENTRICULAR IMPLANTABLE CARDIOVERTER DEFIBRILLATOR UPGRADE;  Surgeon: Evans Lance, MD;  Location: [facility] CATH LAB;  Service: Cardiovascular;  Laterality: N/A;  . COLONOSCOPY  04/23/2011   YSA:[id number] COLON POLYP/ INTERNAL HEMORRHOIDS/ TORTUOUS COLON  . INSERT / REPLACE / REMOVE PACEMAKER  09/1997   Guidant VVI [employer]  . IR FLUORO GUIDE CV LINE RIGHT  03/12/2019  . IR FLUORO GUIDE CV LINE RIGHT  03/28/2019  . IR US GUIDE VASC ACCESS RIGHT  03/12/2019  . PACEMAKER GENERATOR CHANGE  01/2004  . RIGHT HEART CATH N/A 03/09/2019   Procedure: RIGHT HEART CATH;  Surgeon: Larey Dresser, MD;  Location: [facility] CV LAB;  Service: Cardiovascular;  Laterality: N/A;  . UPPER GASTROINTESTINAL ENDOSCOPY  FEB 2013   RING/DIL TO 16 MM, H PYLORI GASTRITIS     reports that she has never smoked. She has never used smokeless tobacco. She reports that she does not drink alcohol or use drugs.  Allergies  Allergen Reactions   . Wheat Swelling  . Latex Rash  . Penicillins Rash  . Sulfa Antibiotics Rash    Family History  Adopted: Yes  Problem Relation Age of Onset  . Autism Son   . Seizures Son   . Diabetes Daughter   . Colon cancer Neg Hx   . Colon polyps Neg Hx     Prior to Admission medications   Medication Sig Start Date End Date Taking? Authorizing Provider  acetaminophen (TYLENOL) 500 MG tablet Take 500 mg by mouth every 6 (six) hours as needed for moderate pain.   Yes [provider]  allopurinol (ZYLOPRIM) 300 MG tablet Take 0.5 tablets (150 mg total) by mouth every morning. Patient taking differently: Take 150 mg by mouth daily.  03/29/19  Yes Clegg, Amy D, NP  apixaban (ELIQUIS) 5 MG TABS tablet Take 1 tablet (5 mg total) by mouth 2 (two) times daily. 03/29/19  Yes Clegg, Amy D, NP  diclofenac Sodium (VOLTAREN) 1 % GEL Apply 2 g topically 2 (two) times daily.  02/23/19  Yes [provider]  gabapentin (NEURONTIN) 300 MG capsule Take 1 capsule (300 mg total) by mouth daily. 03/29/19  Yes Clegg, Amy D, NP  insulin NPH Human (HUMULIN N) 100 UNIT/ML injection Inject 0.34 mLs (34 Units total) into the skin daily. Patient taking differently: Inject 22 Units into the skin daily.  07/22/18  Yes Elayne Snare, MD  midodrine (PROAMATINE) 5 MG tablet Take 3 tablets (15 mg total) by mouth 3 (three) times daily with meals. 03/29/19  Yes Clegg, Amy D, NP  multivitamin (Coby-VIT) TABS tablet Take 1 tablet by mouth at bedtime. 03/29/19  Yes Clegg, Amy D, NP  NOVOLOG FLEXPEN 100 UNIT/ML FlexPen INJECT SUBCUTANEOUSLY 12 UNITS BEFORE BREAKFAST; 14 UNITS AT LUNCH; AND 16 UNITS AT DINNER. Patient taking differently: Inject 12-16 Units into the skin See admin instructions. Use 12 units before breakfast, use 14 units at lunch and use 16 units at dinner 03/30/19  Yes Elayne Snare, MD  traMADol (ULTRAM) 50 MG tablet Take 1 tablet (50 mg total) by mouth every 12 (twelve) hours as needed for moderate pain. 03/29/19  Yes  Clegg, Amy D, NP  TRESIBA FLEXTOUCH 200 UNIT/ML SOPN INJECT 56 UNITS INTO THE SKIN DAILY BEFORE BREAKFAST. Patient taking differently: Inject 56 Units into the skin daily.  03/30/19  Yes Elayne Snare, MD  VICTOZA 18 MG/3ML SOPN INJECT 1.2MG  SUBCUTANEOUSLY DAILY AS DIRECTED. Patient taking differently: Inject 1.2 mg into the skin daily.  03/30/19  Yes Elayne Snare, MD  Insulin Pen Needle 31G X 5 MM MISC 1 each by Does not apply route daily. Use as instructed to check blood sugar 4 times daily. 02/11/18   Elayne Snare, MD  Insulin Syringe-Needle U-100 30G 1 ML MISC 1 each by Does not apply route daily. Use needle to inject insulin 3 times daily. 02/13/18   Elayne Snare, MD  LITETOUCH PEN NEEDLES 31G X 8 MM MISC USE AS DIRECTED UP TO 4 TIMES DAILY. 03/30/19  Elayne Snare, MD  SURE COMFORT INSULIN SYRINGE 31G X 5/16" 0.5 ML MISC INJECT ONCE DAILY AS DIRECTED. 03/30/19   Elayne Snare, MD    Physical Exam: Constitutional: Moderately built and nourished. Vitals:   04/03/19 0355 04/03/19 0400 04/03/19 0500 04/03/19 0530  BP: (!) 101/58 (!) 110/51 (!) 93/52 (!) 98/50  Pulse: (!) 58 60 60 (!) 59  Resp: (!) 21 20 (!) 26 (!) 21  Temp:      TempSrc:      SpO2: (!) 55% 98% 100% 100%   Eyes: Nonicteric no pallor. ENMT: No discharge from the ears eyes nose or mouth. Neck: No mass felt with no neck rigidity. Respiratory: No rhonchi or crepitations. Cardiovascular: S1-S2 heard. Abdomen: Mildly distended nontender bowel sounds present. Musculoskeletal: Bilateral lower extremity extensive edema extending up to the thighs. Skin: No rash. Neurologic: Alert awake oriented to time place and person.  Moves all extremities. Psychiatric: Appears normal with normal affect.   Labs on Admission: I have personally reviewed following labs and imaging studies  CBC: Recent Labs  Lab 03/28/19 0453 03/29/19 0723 04/02/19 2118  WBC 11.7* 11.7* 14.6*  HGB 8.8* 8.6* 8.8*  HCT 25.7* 25.0* 27.3*  MCV 80.8 79.9* 83.2  PLT  204 237 161   Basic Metabolic Panel: Recent Labs  Lab 03/28/19 0453 03/29/19 0723 03/29/19 1231 04/02/19 2118  NA 123* 119* 128* 127*  K 4.6 5.5* 3.9 5.0  CL 82* 77* 89* 86*  CO2 24 22 22  19*  GLUCOSE 270* 211* 134* 226*  BUN 59* 78* 21* 62*  CREATININE 5.51* 6.48* 2.75* 6.18*  CALCIUM 8.8* 8.9 8.6* 8.5*  PHOS 4.7* 6.2*  --   --    GFR: Estimated Creatinine Clearance: 10.1 mL/min (A) (by C-G formula based on SCr of 6.18 mg/dL (H)). Liver Function Tests: Recent Labs  Lab 03/28/19 0453 03/29/19 0723 04/03/19 0040  AST  --   --  52*  ALT  --   --  40  ALKPHOS  --   --  422*  BILITOT  --   --  7.0*  PROT  --   --  7.5  ALBUMIN 1.9* 1.9* 2.1*   No results for input(s): LIPASE, AMYLASE in the last 168 hours. No results for input(s): AMMONIA in the last 168 hours. Coagulation Profile: Recent Labs  Lab 03/28/19 0453  INR 1.4*   Cardiac Enzymes: No results for input(s): CKTOTAL, CKMB, CKMBINDEX, TROPONINI in the last 168 hours. BNP (last 3 results) No results for input(s): PROBNP in the last 8760 hours. HbA1C: No results for input(s): HGBA1C in the last 72 hours. CBG: Recent Labs  Lab 03/28/19 1207 03/28/19 1708 03/28/19 2104 03/29/19 1312 03/29/19 1548  GLUCAP 190* 165* 184* 138* 136*   Lipid Profile: No results for input(s): CHOL, HDL, LDLCALC, TRIG, CHOLHDL, LDLDIRECT in the last 72 hours. Thyroid Function Tests: No results for input(s): TSH, T4TOTAL, FREET4, T3FREE, THYROIDAB in the last 72 hours. Anemia Panel: No results for input(s): VITAMINB12, FOLATE, FERRITIN, TIBC, IRON, RETICCTPCT in the last 72 hours. Urine analysis:    Component Value Date/Time   COLORURINE YELLOW 03/03/2017 1229   APPEARANCEUR CLOUDY (A) 03/03/2017 1229   LABSPEC 1.009 03/03/2017 1229   PHURINE 7.0 03/03/2017 1229   GLUCOSEU NEGATIVE 03/03/2017 1229   GLUCOSEU NEGATIVE 10/14/2014 1102   HGBUR NEGATIVE 03/03/2017 1229   BILIRUBINUR NEGATIVE 03/03/2017 1229   KETONESUR  NEGATIVE 03/03/2017 1229   PROTEINUR 30 (A) 03/03/2017 1229   UROBILINOGEN 0.2 10/14/2014  [employer] 03/03/2017 1229   LEUKOCYTESUR TRACE (A) 03/03/2017 1229   Sepsis Labs: @LABRCNTIP (procalcitonin:4,lacticidven:4) )No results found for this or any previous visit (from the past 240 hour(s)).   Radiological Exams on Admission: DG Chest 2 View  Result Date: 04/02/2019 CLINICAL DATA:  59 year female with shortness of breath and fluid overload. EXAM: CHEST - 2 VIEW COMPARISON:  Chest radiograph dated 03/18/2019. FINDINGS: Right-sided dialysis catheter in similar position. There is cardiomegaly. No vascular congestion or edema. No focal consolidation, pleural effusion, or pneumothorax. Atherosclerotic calcification of the aorta. Left pectoral AICD device. No acute osseous pathology. IMPRESSION: 1. No acute cardiopulmonary process. 2. Cardiomegaly. 3.  Aortic Atherosclerosis (ICD10-I70.0). Electronically Signed   By: Anner Crete M.D.   On: 04/02/2019 21:52    EKG: Independently reviewed.  Paced rhythm.  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 Acute on chronic systolic heart failure presently on dialysis.  Will need to consult nephrology for further fluid management.  Closely follow intake output metabolic panel. 2. History of A. fib on apixaban. 3. Congenital heart block status post pacemaker placement. 4. Diabetes mellitus type 2 on insulin.  We need to confirm patient's home dose of insulin.  There are 2 insulin mentioned in the medication.  I have continue Tresiba 56 units which  patient states she does take.  Along with sliding scale coverage. 5. Anemia likely from ESRD follow-up CBC. 6. Severe protein calorie malnutrition will need nutrition input. 7. Hypotension on midodrine.  Given the significant volume overload will need close monitoring for any further deterioration and will need inpatient status.   DVT prophylaxis: Apixaban. Code Status: No CPR or intubation.  Limited code. Family Communication: Discussed with patient. Disposition Plan: May need rehab. Consults called: Cardiology notified. Admission status: Inpatient.   Rise Patience MD [facility] Pager [phone].  If 7PM-7AM, please contact night-coverage [email or web address] Password [email or web address]  04/03/2019, 6:03 AM

## 2019-04-03 NOTE — Progress Notes (Signed)
Report called to writer patient had 2 liters removed with HMD tx her BP was soft with treatment she did not meet the goal to have 3 liters be remove due to soft bp she did require Albumin be given with treatment. She is being transferred back to her room as we Probation officer this note. Husband is aware of above he is waiting for her in the room. Patient has been weak and required assistance for all ADL's. No further changes noted.

## 2019-04-03 NOTE — Progress Notes (Signed)
[facility]                                                                              Patient Demographics  [name], is a 59 y.o. female, DOB - June 10, 1960, TKW:[id number]  Admit date - 04/02/2019   Admitting Physician Rise Patience, MD  Outpatient Primary MD for the patient is [name], MD  Outpatient specialists:   LOS - 0  days    Chief Complaint  Patient presents with   Generalized swelling       Brief summary  59 year old lady with history of nonischemic cardiomyopathy with systolic dysfunction, JM42- 20% status post CRT-D, end-stage on hemodialysis, insulin-dependent diabetes mellitus and chronic atrial fibrillation presenting with progressively worsening generalized swelling and admitted for acute on chronic systolic CHF.  She was recently discharged on 03/29/2019 following hospitalization due to volume overload/acute CHF,, AKI on CKD now ESRD, initially started on CRRT, then transitioned to HD.    Inpatient rehab versus SNF was recommended but patient insisted on being discharge home with home health.   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)   ATRIAL FIBRILLATION, CHRONIC   Cardiorenal syndrome/chronic kidney disease   Congenital third degree heart block   Acute on chronic systolic CHF (congestive heart failure), NYHA class 4 (HCC)   Acute respiratory failure with hypoxia (HCC)   ESRD (end stage renal disease) (HCC)   CHF (congestive heart failure) (HCC)  Acute on chronic systolic CHF decompensation Nephrology consulted with plans for ultrafiltration during hemodialysis-high risk for hypotension and midodrine will follow up with supportive measures per nephrology during hemodialysis Not candidate for advanced heart failure therapies and hospice accompanied by cardiology Platelet medicine consulted  Chronic atrial fibrillation Continue present plan  Severe  protein calorie malnutrition Nutritional support optimization Nutritionist/dietitian consulted  Type 2 diabetes mellitus Glycemic control    Code Status: DNI, no intubation DVT Prophylaxis: On apixaban Family Communication: Discussed in detail with the patient, all imaging results, lab results explained to the patient   Disposition Plan: To be determined  Time Spent in minutes 35 minutes  Procedures:   Consultants:   Nephrology Cardiology  Antimicrobials:      Medications  Scheduled Meds:  allopurinol  150 mg Oral Daily   apixaban  5 mg Oral BID   Chlorhexidine Gluconate Cloth  6 each Topical Q0600   gabapentin  300 mg Oral Daily   insulin aspart  0-9 Units Subcutaneous TID WC   insulin glargine  56 Units Subcutaneous Daily   midodrine  15 mg Oral TID WC   multivitamin  1 tablet Oral QHS   Continuous Infusions:  sodium chloride     sodium chloride     albumin human     PRN Meds:.sodium chloride, sodium chloride, acetaminophen **OR** acetaminophen, albumin human, alteplase, heparin, heparin, lidocaine (PF), lidocaine-prilocaine, ondansetron **OR** ondansetron (ZOFRAN) IV, pentafluoroprop-tetrafluoroeth, traMADol   Antibiotics   Anti-infectives (From admission, onward)   None        Subjective:   [name] was seen and examined today.  Patient denies dizziness, chest pain, shortness of breath, noted generalized edema with some nonspecific abdominal pain  Objective:   Vitals:   04/03/19 1300 04/03/19 1315 04/03/19 1330 04/03/19 1352  BP: (!) 97/59 102/60 103/61 110/60  Pulse: (!) 59 (!) 59 (!) 59 (!) 59  Resp: 20 (!) 26 (!) 25 18  Temp:    97.7 F (36.5 C)  TempSrc:    Oral  SpO2: 98% 99% 99% 99%   No intake or output data in the 24 hours ending 04/03/19 1414   Wt Readings from Last 3 Encounters:  03/29/19 73.9 kg  03/03/19 87.5 kg  02/10/19 82.6 kg     Exam  General: NAD  HEENT: NCAT,  PERRL,MMM  Neck: SUPPLE, (+)  JVD  Cardiovascular: RRR, (-) GALLOP, (+) 3/6 colic MURMUR  Respiratory: Bibasilar decreased breath sounds  Gastrointestinal: SOFT, (-) DISTENSION, BS(+), mild (+) TENDERNESS  Ext: (-) CYANOSIS, (3+) EDEMA  Neuro: A, OX 3     Data Reviewed:  I have personally reviewed following labs and imaging studies  Micro Results Recent Results (from the past 240 hour(s))  SARS CORONAVIRUS 2 (TAT 6-24 HRS) Nasopharyngeal Nasopharyngeal Swab     Status: None   Collection Time: 04/03/19  1:04 AM   Specimen: Nasopharyngeal Swab  Result Value Ref Range Status   SARS Coronavirus 2 NEGATIVE NEGATIVE Final    Comment: (NOTE) SARS-CoV-2 target nucleic acids are NOT DETECTED. The SARS-CoV-2 RNA is generally detectable in upper and lower respiratory specimens during the acute phase of infection. Negative results do not preclude SARS-CoV-2 infection, do not rule out co-infections with other pathogens, and should not be used as the sole basis for treatment or other patient management decisions. Negative results must be combined with clinical observations, patient history, and epidemiological information. The expected result is Negative. Fact Sheet for Patients: SugarRoll.be Fact Sheet for Healthcare Providers: https://www.woods-mathews.com/ This test is not yet approved or cleared by the [place] FDA and  has been authorized for detection and/or diagnosis of SARS-CoV-2 by FDA under an Emergency Use Authorization (EUA). This EUA will remain  in effect (meaning this test can be used) for the duration of the COVID-19 declaration under Section 56 4(b)(1) of the Act, 21 U.S.C. section 360bbb-3(b)(1), unless the authorization is terminated or revoked sooner. Performed at [facility], [place] [address] [place], [place] [address]     Radiology Reports CT ABDOMEN PELVIS WO CONTRAST  Result Date: 03/08/2019 CLINICAL DATA:  Abdominal distension EXAM:  CT ABDOMEN AND PELVIS WITHOUT CONTRAST TECHNIQUE: Multidetector CT imaging of the abdomen and pelvis was performed following the standard protocol without IV contrast. COMPARISON:  None. FINDINGS: LOWER CHEST: Cardiomegaly bibasilar atelectasis. HEPATOBILIARY: Normal hepatic contours. No intra- or extrahepatic biliary dilatation. There is cholelithiasis without acute inflammation. PANCREAS: Normal pancreas. No ductal dilatation or peripancreatic fluid collection. SPLEEN: Normal. ADRENALS/URINARY TRACT: The adrenal glands are normal. No hydronephrosis, nephroureterolithiasis or solid renal mass. The urinary bladder is normal for degree of distention STOMACH/BOWEL: There is no hiatal hernia. Normal duodenal course and caliber. No small bowel dilatation or inflammation. No focal colonic abnormality. Not visualized. No right lower quadrant inflammation or free fluid. VASCULAR/LYMPHATIC: There is calcific atherosclerosis of the abdominal aorta. No abdominal or pelvic lymphadenopathy. REPRODUCTIVE: Normal uterus and ovaries. MUSCULOSKELETAL. No bony spinal canal stenosis or focal osseous abnormality. OTHER: Anasarca. IMPRESSION: 1. No acute abnormality of the abdomen or pelvis. 2. Cardiomegaly and calcific aortic atherosclerosis (ICD10-I70.0). 3. Diffuse anasarca. Electronically Signed  By: Ulyses Jarred M.D.   On: 03/08/2019 02:02   DG Chest 2 View  Result Date: 04/02/2019 CLINICAL DATA:  59 year female with shortness of breath and fluid overload. EXAM: CHEST - 2 VIEW COMPARISON:  Chest radiograph dated 03/18/2019. FINDINGS: Right-sided dialysis catheter in similar position. There is cardiomegaly. No vascular congestion or edema. No focal consolidation, pleural effusion, or pneumothorax. Atherosclerotic calcification of the aorta. Left pectoral AICD device. No acute osseous pathology. IMPRESSION: 1. No acute cardiopulmonary process. 2. Cardiomegaly. 3.  Aortic Atherosclerosis (ICD10-I70.0). Electronically  Signed   By: Anner Crete M.D.   On: 04/02/2019 21:52   CT HEAD WO CONTRAST  Result Date: 03/24/2019 CLINICAL DATA:  Fall on heparin. EXAM: CT HEAD WITHOUT CONTRAST TECHNIQUE: Contiguous axial images were obtained from the base of the skull through the vertex without intravenous contrast. COMPARISON:  03/13/2012 FINDINGS: Brain: No evidence of acute infarction, hemorrhage, hydrocephalus, extra-axial collection or mass lesion/mass effect. Vascular: No hyperdense vessel or unexpected calcification. Skull: Normal. Negative for fracture or focal lesion. Sinuses/Orbits: No acute finding. IMPRESSION: Negative head CT. Electronically Signed   By: Monte Fantasia M.D.   On: 03/24/2019 04:58   CT CHEST WO CONTRAST  Result Date: 03/19/2019 CLINICAL DATA:  Hypoxemia and dyspnea. EXAM: CT CHEST WITHOUT CONTRAST TECHNIQUE: Multidetector CT imaging of the chest was performed following the standard protocol without IV contrast. COMPARISON:  Chest radiograph from one day prior. FINDINGS: Cardiovascular: Moderate cardiomegaly. No significant pericardial effusion/thickening. Left anterior descending coronary atherosclerosis. 3 lead left subclavian ICD is noted with lead tips in coronary sinus and right ventricle. Right internal jugular central venous catheter terminates at the cavoatrial junction. Atherosclerotic thoracic aorta with ectatic 4.4 cm ascending thoracic aorta. Dilated main pulmonary artery (3.8 cm diameter). Mediastinum/Nodes: No discrete thyroid nodules. Unremarkable esophagus. No axillary adenopathy. Enlarged 1.4 cm left prevascular node (series 3/image 43). No additional pathologically enlarged mediastinal nodes. No discrete hilar adenopathy on these noncontrast images. Lungs/Pleura: No pneumothorax. No pleural effusion. Mild platelike scarring versus atelectasis in posterior left lower lobe. No acute consolidative airspace disease, lung masses or significant pulmonary nodules. Upper abdomen: Finely  irregular liver surface, cannot exclude cirrhosis. Simple exophytic 1.5 cm upper left renal cyst. Musculoskeletal: No aggressive appearing focal osseous lesions. Moderate thoracic spondylosis. IMPRESSION: 1. Moderate cardiomegaly.  One vessel coronary atherosclerosis. 2. Dilated main pulmonary artery, suggesting pulmonary arterial hypertension. 3. Mild platelike scarring versus atelectasis at the left lung base. Otherwise no active pulmonary disease. No pleural effusions. 4. Nonspecific mild left prevascular mediastinal lymphadenopathy. 5. Ectatic 4.4 cm ascending thoracic aorta. Recommend annual imaging followup by CTA or MRA. This recommendation follows 2010 ACCF/AHA/AATS/ACR/ASA/SCA/SCAI/SIR/STS/SVM Guidelines for the Diagnosis and Management of Patients with Thoracic Aortic Disease. Circulation. 2010; 121: T364-W803. Aortic aneurysm NOS (ICD10-I71.9). 6. Finely irregular liver surface, cannot exclude cirrhosis. Aortic Atherosclerosis (ICD10-I70.0). Electronically Signed   By: Ilona Sorrel M.D.   On: 03/19/2019 09:05   CARDIAC CATHETERIZATION  Result Date: 03/09/2019 1. Right > left heart failure with low PAPI. 2. Pulmonary venous hypertension. 3. Adequate cardiac output on dobutamine 2.5 mcg/kg/min  CT FEMUR RIGHT WO CONTRAST  Result Date: 03/08/2019 CLINICAL DATA:  Upper leg trauma, right thigh swelling EXAM: CT OF THE LOWER RIGHT EXTREMITY WITHOUT CONTRAST TECHNIQUE: Multidetector CT imaging of the right lower extremity was performed according to the standard protocol. COMPARISON:  None. FINDINGS: Bones/Joint/Cartilage No fracture or dislocation. There is moderate right hip osteoarthritis with superior joint space loss and marginal osteophyte formation. No periosteal reaction  or cortical destruction. No large knee or hip joint effusion is seen. Ligaments Suboptimally assessed by CT. Muscles and Tendons The enthesopathy seen at the hamstrings insertion site. The remainder of the visualized portion of  the tendons appear to be intact. The muscles surrounding the thigh appear to be intact. Soft tissues There is diffuse skin thickening and subcutaneous edema seen surrounding the right lower extremity. Overlying the greater trochanter adjacent to the deep fascial layer there is thin linear area of fluid. The fluid does not appear to be encapsulated however. Dense vascular calcifications seen within the deep pelvis. IMPRESSION: 1. No acute osseous abnormality or soft tissue hematoma. 2. Thin non loculated fluid seen adjacent to the deep fascial layers/iliotibial tibial tract overlying the greater trochanter which could represent a posttraumatic seroma/Mild Sherry Ruffing lesion. 3. Diffuse subcutaneous edema and skin thickening surrounding the right lower extremity. Electronically Signed   By: Prudencio Pair M.D.   On: 03/08/2019 02:09   IR Fluoro Guide CV Line Right  Result Date: 03/28/2019 INDICATION: End-stage renal disease. Please convert temporary dialysis catheter to a tunneled/permanent dialysis catheter for the continuation of dialysis. EXAM: FLUOROSCOPIC GUIDED CONVERSION OF NON TUNNELED TO TUNNELED CENTRAL VENOUS HEMODIALYSIS CATHETER COMPARISON:  Image guided temporary dialysis catheter placement-03/12/2019 MEDICATIONS: None ANESTHESIA/SEDATION: Moderate (conscious) sedation was employed during this procedure. A total of Versed 0.5 mg and Fentanyl 25 mcg was administered intravenously. Moderate Sedation Time: 11 minutes. The patient's level of consciousness and vital signs were monitored continuously by radiology nursing throughout the procedure under my direct supervision. FLUOROSCOPY TIME:  24 seconds (2 mGy) COMPLICATIONS: None immediate. PROCEDURE: Informed written consent was obtained from the patient after a discussion of the risks, benefits, and alternatives to treatment. Questions regarding the procedure were encouraged and answered. The skin and external portion of the existing hemodialysis  catheter was prepped with chlorhexidine in a sterile fashion, and a sterile drape was applied covering the operative field. Maximum barrier sterile technique with sterile gowns and gloves were used for the procedure. A timeout was performed prior to the initiation of the procedure. A lumen of the none tunneled temporary dialysis catheter was cannulated with a stiff Glidewire in utilized for measurement purposes. Next, the Glidewire was advanced to the level of the IVC. A 19 cm tip to cuff HemoSplit dialysis catheter was tunneled from a site along the anterior chest to the venotomy site. Under intermittent fluoroscopic guidance, the temporary dialysis catheter was exchanged for a peel-away sheath. The tunneled dialysis catheter was inserted into the peel-away sheath with tips open terminating within in the superior aspect the right atrium. Final catheter positioning was confirmed and documented with a spot fluoroscopic image. The catheter aspirates and flushes normally. The catheter was flushed with appropriate volume heparin dwells. The catheter exit site was secured with a 0-Prolene retention sutures. The venotomy site was apposed with derma bond Steri-Strips. Both lumens were heparinized. A dressing was placed. The patient tolerated the procedure well without immediate post procedural complication. IMPRESSION: Successful fluoroscopic guided conversion of a temporary to a permanent 19 cm tip to cuff tunneled hemodialysis catheter with tips terminating within the right atrium. The catheter is ready for immediate use. Electronically Signed   By: Sandi Mariscal M.D.   On: 03/28/2019 10:55   IR Fluoro Guide CV Line Right  Result Date: 03/12/2019 INDICATION: 59 year old female with acute kidney injury in need of hemodialysis. She presents for placement of a temporary hemodialysis catheter. EXAM: IR ULTRASOUND GUIDANCE VASC ACCESS RIGHT; IR  RIGHT FLUORO GUIDE CV LINE MEDICATIONS: None. ANESTHESIA/SEDATION: None.  FLUOROSCOPY TIME:  Fluoroscopy Time: 0 minutes 30 seconds (2.5 mGy). COMPLICATIONS: None immediate. PROCEDURE: Informed written consent was obtained from the patient after a thorough discussion of the procedural risks, benefits and alternatives. All questions were addressed. Maximal Sterile Barrier Technique was utilized including caps, mask, sterile gowns, sterile gloves, sterile drape, hand hygiene and skin antiseptic. A timeout was performed prior to the initiation of the procedure. The right internal jugular vein was interrogated with ultrasound and found to be widely patent. An image was obtained and stored for the medical record. Local anesthesia was attained by infiltration with 1% lidocaine. A small dermatotomy was made. Under real-time sonographic guidance, the vessel was punctured with a 21 gauge micropuncture needle. Using standard technique, the initial micro needle was exchanged over a 0.018 micro wire for a transitional 4 [place] micro sheath. The micro sheath was then exchanged over a 0.035 wire for a fascial dilator. The soft tissue tract was then dilated. A 16 cm triple-lumen temporary hemodialysis catheter was then advanced over the wire and positioned with the catheter tip in the upper right atrium. Catheter flushes and aspirates easily. The catheter was flushed with heparinized saline, capped and secured to the skin with 0 Prolene suture. A sterile bandage was applied. IMPRESSION: Successful placement of a right IJ approach 16 cm non tunneled triple-lumen hemodialysis catheter. The catheter tips are in the upper right atrium and the catheter is ready for immediate use. Signed, Criselda Peaches, MD, [facility] Radiology Electronically Signed   By: Jacqulynn Cadet M.D.   On: 03/12/2019 16:20   IR US Guide Vasc Access Right  Result Date: 03/12/2019 INDICATION: 59 year old female with acute kidney injury in need of hemodialysis. She  presents for placement of a temporary hemodialysis catheter. EXAM: IR ULTRASOUND GUIDANCE VASC ACCESS RIGHT; IR RIGHT FLUORO GUIDE CV LINE MEDICATIONS: None. ANESTHESIA/SEDATION: None. FLUOROSCOPY TIME:  Fluoroscopy Time: 0 minutes 30 seconds (2.5 mGy). COMPLICATIONS: None immediate. PROCEDURE: Informed written consent was obtained from the patient after a thorough discussion of the procedural risks, benefits and alternatives. All questions were addressed. Maximal Sterile Barrier Technique was utilized including caps, mask, sterile gowns, sterile gloves, sterile drape, hand hygiene and skin antiseptic. A timeout was performed prior to the initiation of the procedure. The right internal jugular vein was interrogated with ultrasound and found to be widely patent. An image was obtained and stored for the medical record. Local anesthesia was attained by infiltration with 1% lidocaine. A small dermatotomy was made. Under real-time sonographic guidance, the vessel was punctured with a 21 gauge micropuncture needle. Using standard technique, the initial micro needle was exchanged over a 0.018 micro wire for a transitional 4 [place] micro sheath. The micro sheath was then exchanged over a 0.035 wire for a fascial dilator. The soft tissue tract was then dilated. A 16 cm triple-lumen temporary hemodialysis catheter was then advanced over the wire and positioned with the catheter tip in the upper right atrium. Catheter flushes and aspirates easily. The catheter was flushed with heparinized saline, capped and secured to the skin with 0 Prolene suture. A sterile bandage was applied. IMPRESSION: Successful placement of a right IJ approach 16 cm non tunneled triple-lumen hemodialysis catheter. The catheter tips are in the upper right atrium and the catheter is ready for immediate use. Signed, Criselda Peaches, MD, [facility] Radiology Electronically Signed   By: Myrle Sheng  Laurence Ferrari M.D.   On: 03/12/2019 16:20   DG CHEST PORT 1 VIEW  Result Date: 03/18/2019 CLINICAL DATA:  Shortness of breath EXAM: PORTABLE CHEST 1 VIEW COMPARISON:  03/17/2019 FINDINGS: Right-sided IJ approach central venous catheter with distal tip terminating at the level of the superior cavoatrial junction. Stable positioning of left-sided implanted cardiac device. Stable cardiomegaly. Calcified thoracic aorta. No new focal airspace consolidation, pleural effusion, or pneumothorax. IMPRESSION: Stable exam.  No active disease. Electronically Signed   By: Davina Poke D.O.   On: 03/18/2019 10:19   DG CHEST PORT 1 VIEW  Result Date: 03/17/2019 CLINICAL DATA:  CHF EXAM: PORTABLE CHEST 1 VIEW COMPARISON:  03/07/2019 FINDINGS: Cardiac enlargement with AICD. Negative for heart failure. Atherosclerotic aortic arch. Lungs are clear without infiltrate or effusion. IMPRESSION: No active disease. Electronically Signed   By: Franchot Gallo M.D.   On: 03/17/2019 10:58   DG Chest Portable 1 View  Result Date: 03/07/2019 CLINICAL DATA:  Shortness of breath, weakness EXAM: PORTABLE CHEST 1 VIEW COMPARISON:  06/25/2017 FINDINGS: Left AICD remains in place, unchanged. Cardiomegaly. No confluent opacities or effusions. No edema. No acute bony abnormality. IMPRESSION: Cardiomegaly.  No acute cardiopulmonary disease. Electronically Signed   By: Rolm Baptise M.D.   On: 03/07/2019 21:04   ECHOCARDIOGRAM COMPLETE  Result Date: 03/09/2019   ECHOCARDIOGRAM REPORT   Patient Name:   JADYNN EPPING Date of Exam: 03/09/2019 Medical Rec #:  [id number]       Height:       66.0 in Accession #:    [id number]      Weight:       198.4 lb Date of Birth:  02-05-1961        BSA:          1.99 m Patient Age:    42 years        BP:           99/67 mmHg Patient Gender: F               HR:           60 bpm. Exam Location:  Inpatient Procedure: 2D Echo and Intracardiac Opacification Agent Indications:    CHF-Acute Systolic 938.10 / F75.10   History:        Patient has prior history of Echocardiogram examinations, most                 recent 04/10/2017. CHF, Biventricular ICD, Stroke;                 Arrythmias:Atrial Fibrillation. Chronic kidney disease.                 Congenital heart block. GERD.  Sonographer:    [name] RDCS Referring Phys: [place]  1. Left ventricular ejection fraction, by visual estimation, is <20%. The left ventricle has severely decreased function. There is no left ventricular hypertrophy.  2. Definity contrast agent was given IV to delineate the left ventricular endocardial borders.  3. Abnormal septal motion consistent with left bundle branch block.  4. Left ventricular diastolic parameters are consistent with Grade III diastolic dysfunction (restrictive).  5. Moderately dilated left ventricular internal cavity size.  6. The left ventricle demonstrates global hypokinesis.  7. Global right ventricle has mildly reduced systolic function.The right ventricular size is severely enlarged. No increase in right ventricular wall thickness.  8. Left atrial size was severely dilated.  9. Right atrial size was severely  dilated. 10. Trivial pericardial effusion is present. 11. The mitral valve is normal in structure. No evidence of mitral valve regurgitation. 12. The tricuspid valve is normal in structure. 13. The aortic valve is normal in structure. Aortic valve regurgitation is not visualized. Mild aortic valve sclerosis without stenosis. 14. Pulmonic regurgitation is moderate. 15. The pulmonic valve was normal in structure. Pulmonic valve regurgitation is moderate. 16. Mildly dilated pulmonary artery. 17. Mildly elevated pulmonary artery systolic pressure. 18. The tricuspid regurgitant velocity is 2.32 m/s, and with an assumed right atrial pressure of 15 mmHg, the estimated right ventricular systolic pressure is mildly elevated at 36.6 mmHg. 19. A pacer wire is visualized. 20. The inferior vena cava  is dilated in size with <50% respiratory variability, suggesting right atrial pressure of 15 mmHg. 21. No intracardiac thrombi or masses were visualized. 22. No significant change from prior study (April 10, 2017). 23. Prior images reviewed side by side. FINDINGS  Left Ventricle: Left ventricular ejection fraction, by visual estimation, is <20%. The left ventricle has severely decreased function. Definity contrast agent was given IV to delineate the left ventricular endocardial borders. The left ventricle demonstrates global hypokinesis. The left ventricular internal cavity size was moderately dilated left ventricle. There is no left ventricular hypertrophy. Abnormal (paradoxical) septal motion, consistent with left bundle branch block. The left ventricular diastology could not be evaluated due to atrial fibrillation. Left ventricular diastolic parameters are consistent with Grade III diastolic dysfunction (restrictive). Right Ventricle: The right ventricular size is severely enlarged. No increase in right ventricular wall thickness. Global RV systolic function is has mildly reduced systolic function. The tricuspid regurgitant velocity is 2.32 m/s, and with an assumed right atrial pressure of 15 mmHg, the estimated right ventricular systolic pressure is mildly elevated at 36.6 mmHg. Left Atrium: Left atrial size was severely dilated. Right Atrium: Right atrial size was severely dilated Pericardium: Trivial pericardial effusion is present. Mitral Valve: The mitral valve is normal in structure. No evidence of mitral valve regurgitation, with centrally-directed jet. Tricuspid Valve: The tricuspid valve is normal in structure. Tricuspid valve regurgitation is severe. Aortic Valve: The aortic valve is normal in structure. Aortic valve regurgitation is not visualized. Mild aortic valve sclerosis is present, with no evidence of aortic valve stenosis. Pulmonic Valve: The pulmonic valve was normal in structure. Pulmonic  valve regurgitation is moderate. Pulmonic regurgitation is moderate. Aorta: The aortic root and ascending aorta are structurally normal, with no evidence of dilitation. Pulmonary Artery: The pulmonary artery is mildly dilated. Venous: The inferior vena cava is dilated in size with less than 50% respiratory variability, suggesting right atrial pressure of 15 mmHg. IAS/Shunts: No atrial level shunt detected by color flow Doppler. Additional Comments: No intracardiac thrombi or masses were visualized. A pacer wire is visualized.  LEFT VENTRICLE PLAX 2D LVIDd:         6.10 cm       Diastology LVIDs:         5.20 cm       LV e' lateral:   5.76 cm/s LV PW:         1.00 cm       LV E/e' lateral: 18.0 LV IVS:        1.00 cm       LV e' medial:    4.95 cm/s LVOT diam:     1.80 cm       LV E/e' medial:  21.0 LV SV:         57  ml LV SV Index:   27.63 LVOT Area:     2.54 cm  LV Volumes (MOD) LV area d, A2C:    40.10 cm LV area d, A4C:    46.10 cm LV area s, A2C:    36.70 cm LV area s, A4C:    40.70 cm LV major d, A2C:   8.43 cm LV major d, A4C:   8.80 cm LV major s, A2C:   8.23 cm LV major s, A4C:   8.19 cm LV vol d, MOD A2C: 159.0 ml LV vol d, MOD A4C: 201.0 ml LV vol s, MOD A2C: 139.0 ml LV vol s, MOD A4C: 162.0 ml LV SV MOD A2C:     20.0 ml LV SV MOD A4C:     201.0 ml LV SV MOD BP:      32.6 ml RIGHT VENTRICLE RV Basal diam:  5.61 cm RV S prime:     11.40 cm/s TAPSE (M-mode): 1.8 cm LEFT ATRIUM              Index       RIGHT ATRIUM           Index LA diam:        4.40 cm  2.21 cm/m  RA Area:     32.60 cm LA Vol (A2C):   168.0 ml 84.29 ml/m RA Volume:   128.00 ml 64.22 ml/m LA Vol (A4C):   161.0 ml 80.78 ml/m LA Biplane Vol: 174.0 ml 87.30 ml/m  AORTIC VALVE LVOT Vmax:   120.00 cm/s LVOT Vmean:  69.500 cm/s LVOT VTI:    0.170 m  AORTA Ao Root diam: 2.40 cm Ao Asc diam:  3.30 cm MITRAL VALVE                TRICUSPID VALVE MV Area (PHT): 5.97 cm     TR Peak grad:   21.6 mmHg MV PHT:        36.83 msec   TR Vmax:         286.00 cm/s MV Decel Time: 127 msec MV E velocity: 104.00 cm/s  SHUNTS                             Systemic VTI:  0.17 m                             Systemic Diam: 1.80 cm  Dani Gobble Croitoru MD Electronically signed by Sanda Klein MD Signature Date/Time: 03/09/2019/3:16:38 PM    Final    VAS [place] LOWER EXTREMITY VENOUS (DVT)  Result Date: 03/20/2019  Lower Venous Study Indications: Pain, and Edema. Other Indications: Acute respiratory failure and acute pulmonary edema. Limitations: Body habitus and Patient's inability to tolerate full compression of the vein. Comparison Study: Negative right lower ext. venous study on 03/13/2019. Performing Technologist: Oda Cogan RDMS, RVT  Examination Guidelines: A complete evaluation includes B-mode imaging, spectral Doppler, color Doppler, and power Doppler as needed of all accessible portions of each vessel. Bilateral testing is considered an integral part of a complete examination. Limited examinations for reoccurring indications may be performed as noted.  +---------+---------------+---------+-----------+----------+-------------------+  RIGHT     Compressibility Phasicity Spontaneity Properties Thrombus Aging       +---------+---------------+---------+-----------+----------+-------------------+  CFV       Full            Yes  Yes                                         +---------+---------------+---------+-----------+----------+-------------------+  SFJ       Full                                                                  +---------+---------------+---------+-----------+----------+-------------------+  FV Prox   Full                                                                  +---------+---------------+---------+-----------+----------+-------------------+  FV Mid                    Yes       Yes                    patient unable to                                                                tolerate                                                                          compression          +---------+---------------+---------+-----------+----------+-------------------+  FV Distal                 Yes       Yes                    patient unable to                                                                tolerate                                                                         compression          +---------+---------------+---------+-----------+----------+-------------------+  POP       Full  Yes       Yes                                         +---------+---------------+---------+-----------+----------+-------------------+  PTV       Full                                                                  +---------+---------------+---------+-----------+----------+-------------------+  PERO      Full                                                                  +---------+---------------+---------+-----------+----------+-------------------+ Patent femoral vein by color Doppler imaging.  +---------+---------------+---------+-----------+----------+-------------------+  LEFT      Compressibility Phasicity Spontaneity Properties Thrombus Aging       +---------+---------------+---------+-----------+----------+-------------------+  CFV       Full                                                                  +---------+---------------+---------+-----------+----------+-------------------+  SFJ       Full                                                                  +---------+---------------+---------+-----------+----------+-------------------+  FV Prox   Full                                                                  +---------+---------------+---------+-----------+----------+-------------------+  FV Mid                    Yes       Yes                    patient unable to                                                                tolerate  compression           +---------+---------------+---------+-----------+----------+-------------------+  FV Distal                 Yes       Yes                    patient unable to                                                                tolerate                                                                         compression          +---------+---------------+---------+-----------+----------+-------------------+  PFV       Full                                                                  +---------+---------------+---------+-----------+----------+-------------------+  POP       Full            Yes       Yes                                         +---------+---------------+---------+-----------+----------+-------------------+  PTV       Full                                                                  +---------+---------------+---------+-----------+----------+-------------------+  PERO      Full                                                                  +---------+---------------+---------+-----------+----------+-------------------+ Patent femoral vein by color Doppler imaging.   Summary: Right: There is no evidence of obvious deep vein thrombosis in the lower extremity. No cystic structure found in the popliteal fossa. Left: There is no evidence of obvious deep vein thrombosis in the lower extremity. No cystic structure found in the popliteal fossa.  *See table(s) above for measurements and observations. Electronically signed by Ruta Hinds MD on 03/20/2019 at 9:43:27 AM.    Final    VAS [place] LOWER EXTREMITY VENOUS (DVT)  Result Date: 03/13/2019  Lower Venous Study Indications: Pain.  Limitations: Pain  tolerance. Comparison Study: no prior Performing Technologist: Abram Sander RVS  Examination Guidelines: A complete evaluation includes B-mode imaging, spectral Doppler, color Doppler, and power Doppler as needed of all accessible portions of each vessel. Bilateral testing is considered an integral part of  a complete examination. Limited examinations for reoccurring indications may be performed as noted.  +---------+---------------+---------+-----------+----------+-------------------+  RIGHT     Compressibility Phasicity Spontaneity Properties Thrombus Aging       +---------+---------------+---------+-----------+----------+-------------------+  CFV       Full            Yes       Yes                                         +---------+---------------+---------+-----------+----------+-------------------+  SFJ       Full                                                                  +---------+---------------+---------+-----------+----------+-------------------+  FV Prox   Full                                                                  +---------+---------------+---------+-----------+----------+-------------------+  FV Mid                    Yes       Yes                    unable to tolerate                                                               compression          +---------+---------------+---------+-----------+----------+-------------------+  FV Distal                 Yes       Yes                    unable to tolerate                                                               compression          +---------+---------------+---------+-----------+----------+-------------------+  PFV       Full                                                                  +---------+---------------+---------+-----------+----------+-------------------+  POP       Full            Yes       Yes                                         +---------+---------------+---------+-----------+----------+-------------------+  PTV       Full                                                                  +---------+---------------+---------+-----------+----------+-------------------+  PERO      Full                                                                   +---------+---------------+---------+-----------+----------+-------------------+   +----+---------------+---------+-----------+----------+--------------+  LEFT Compressibility Phasicity Spontaneity Properties Thrombus Aging  +----+---------------+---------+-----------+----------+--------------+  CFV  Full            Yes                                              +----+---------------+---------+-----------+----------+--------------+     Summary: Right: There is no evidence of deep vein thrombosis in the lower extremity. However, portions of this examination were limited- see technologist comments above. No cystic structure found in the popliteal fossa. Left: No evidence of common femoral vein obstruction.  *See table(s) above for measurements and observations. Electronically signed by Monica Martinez MD on 03/13/2019 at 1:49:39 PM.    Final    [place] EKG SITE RITE  Result Date: 03/08/2019 If Site Rite image not attached, placement could not be confirmed due to current cardiac rhythm.   Lab Data:  CBC: Recent Labs  Lab 03/28/19 0453 03/29/19 0723 04/02/19 2118  WBC 11.7* 11.7* 14.6*  HGB 8.8* 8.6* 8.8*  HCT 25.7* 25.0* 27.3*  MCV 80.8 79.9* 83.2  PLT 204 237 671   Basic Metabolic Panel: Recent Labs  Lab 03/28/19 0453 03/29/19 0723 03/29/19 1231 04/02/19 2118  NA 123* 119* 128* 127*  K 4.6 5.5* 3.9 5.0  CL 82* 77* 89* 86*  CO2 24 22 22  19*  GLUCOSE 270* 211* 134* 226*  BUN 59* 78* 21* 62*  CREATININE 5.51* 6.48* 2.75* 6.18*  CALCIUM 8.8* 8.9 8.6* 8.5*  PHOS 4.7* 6.2*  --   --    GFR: Estimated Creatinine Clearance: 10.1 mL/min (A) (by C-G formula based on SCr of 6.18 mg/dL (H)). Liver Function Tests: Recent Labs  Lab 03/28/19 0453 03/29/19 0723 04/03/19 0040  AST  --   --  52*  ALT  --   --  40  ALKPHOS  --   --  422*  BILITOT  --   --  7.0*  PROT  --   --  7.5  ALBUMIN 1.9* 1.9* 2.1*   No results for input(s): LIPASE, AMYLASE in the last 168 hours. No  results for  input(s): AMMONIA in the last 168 hours. Coagulation Profile: Recent Labs  Lab 03/28/19 0453  INR 1.4*   Cardiac Enzymes: No results for input(s): CKTOTAL, CKMB, CKMBINDEX, TROPONINI in the last 168 hours. BNP (last 3 results) No results for input(s): PROBNP in the last 8760 hours. HbA1C: No results for input(s): HGBA1C in the last 72 hours. CBG: Recent Labs  Lab 03/28/19 2104 03/29/19 1312 03/29/19 1548 04/03/19 0756 04/03/19 1208  GLUCAP 184* 138* 136* 199* 248*   Lipid Profile: No results for input(s): CHOL, HDL, LDLCALC, TRIG, CHOLHDL, LDLDIRECT in the last 72 hours. Thyroid Function Tests: No results for input(s): TSH, T4TOTAL, FREET4, T3FREE, THYROIDAB in the last 72 hours. Anemia Panel: No results for input(s): VITAMINB12, FOLATE, FERRITIN, TIBC, IRON, RETICCTPCT in the last 72 hours. Urine analysis:    Component Value Date/Time   COLORURINE YELLOW 03/03/2017 1229   APPEARANCEUR CLOUDY (A) 03/03/2017 1229   LABSPEC 1.009 03/03/2017 1229   PHURINE 7.0 03/03/2017 1229   GLUCOSEU NEGATIVE 03/03/2017 1229   GLUCOSEU NEGATIVE 10/14/2014 1102   HGBUR NEGATIVE 03/03/2017 1229   BILIRUBINUR NEGATIVE 03/03/2017 1229   KETONESUR NEGATIVE 03/03/2017 1229   PROTEINUR 30 (A) 03/03/2017 1229   UROBILINOGEN 0.2 10/14/2014 1102   NITRITE NEGATIVE 03/03/2017 1229   LEUKOCYTESUR TRACE (A) 03/03/2017 1229     Benito Mccreedy M.D. [facility] 04/03/2019, 2:14 PM  Pager: [phone] Between 7am to 7pm - call Pager - [phone]  After 7pm go to [email or web address] - password [email or web address]  Call night coverage person covering after 7pm

## 2019-04-03 NOTE — ED Notes (Signed)
Pt expressed pain 10/10 in L hip & BL legs. Will administer PRN tramadol as ordered.

## 2019-04-03 NOTE — ED Notes (Signed)
Tele   Breakfast ordered  

## 2019-04-03 NOTE — ED Notes (Signed)
Pt expressed desire to have buttocks examined, said that she had a bedsore. This RN assessed sacral region, applied 2x2 gauze pad with antibiotic ointment to small stage 2 sacral ulcer.

## 2019-04-03 NOTE — Consult Note (Addendum)
Heart Failure Consult    Patient ID: [name] MRN: [id number], DOB/AGE: 60-Jun-1962   Admit date: 04/02/2019 Date of Consult: 04/03/2019  Primary Physician: [name], MD Primary Cardiologist: Kate Sable, MD Requesting Provider: Isla Pence, MD  Patient Profile    [name] is a 59 y.o. female with a history of nonischemic cardiomyopathy with an EF of less than 20% status post CRT-D, HFrEF, chronic kidney disease which recently advanced to end-stage renal disease now on dialysis, permanent atrial fibrillation on Eliquis, insulin-dependent diabetes, relative hypotension, monoclonal gammopathy, congenital complete heart block, and prior stroke who is being seen today for the evaluation of acute on chronic systolic congestive heart failure at the request of Dr. Vista Lawman.  Past Medical History   Past Medical History:  Diagnosis Date  . Automatic implantable cardioverter-defibrillator in situ    a. 03/2013: BSX Energen CRTD BiV ICC, ser# [id number]  . Chronic anticoagulation    a.  Previously on warfarin.  Changed to Eliquis in January 2021.  Marland Kitchen Chronic atrial fibrillation (HCC)    a. CHA2DS2VASc = 5-->Eliquis.  . Chronic kidney disease    creatinine- 1.8 in 09/2006 and 2.0 in 05/2007; 2.05 in 2011, 2.29 in 2012  . Chronic systolic CHF (congestive heart failure) ([place])    a. 03/2013 Echo: Sev LV dysfxn with sev diff HK, restrictive phys, diast dysfxn, mild MR, sev dil LA/RA, mod PR, PASP 24mHg; b. 12/2019 Echo: EF <20%, glob HK. Sev enlarged RV w/ mildly reduced fxn. Sev BAE. Mild AoV sclerosis. Mod PR; c. 02/2019 RHC (on dobutamine 2.580m): RA 18, RV 43/18, PA 45/19(28), PCWP 19, CO/CI 6.1/3.06. PVR 1.5 WU. PAPI 1.4.  . Congenital third degree heart block    a. Guidant VVI pacemaker implanted in 09/1997; b. gen change in 01/2004;  c. 03/2013 upgrade to BSHanoverser# [id number] . Dizziness and giddiness 05/19/2012  . Dysphagia 08/14/2011   FEB 2013 EGD/DIL 16  MM; GERD; h/o gastroesophageal reflux disease; + H. Pylori gastritis   . ESR[place] ([place] stage renal disease) ([place]   a. HD since 02/2019.  . Marland KitchenERD (gastroesophageal reflux disease)   . Helicobacter pylori gastritis 08/14/2011  . IDDM (insulin dependent diabetes mellitus)   . Kidney stones 1990's  . Monoclonal gammopathy   . Nonischemic cardiomyopathy ([facility]   a. 03/2013 Echo: Sev LV dysfxn with sev diff HK, restrictive phys, diast dysfxn, mild MR, sev dil LA/RA, mod PR, PASP 5041m;  b. 03/2013: BSX Energen CRTD BiV ICC, ser# [id number]. 02/2019 Echo: EF <20%.  . Potassium (K) excess   . [facility]   denies residual on 04/21/2013  . Subcutaneous nodules    a. 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    Past Surgical History:  Procedure Laterality Date  . BI-VENTRICULAR IMPLANTABLE CARDIOVERTER DEFIBRILLATOR  (CRT-D)  04/21/2013  . BI-VENTRICULAR IMPLANTABLE CARDIOVERTER DEFIBRILLATOR UPGRADE N/A 04/21/2013   Procedure: BI-VENTRICULAR IMPLANTABLE CARDIOVERTER DEFIBRILLATOR UPGRADE;  Surgeon: GreEvans LanceD;  [facility];  Service: Cardiovascular;  Laterality: N/A;  . COLONOSCOPY  04/23/2011   SLFTDV:VOHYWVPXTGLON POLYP/ INTERNAL HEMORRHOIDS/ TORTUOUS COLON  . INSERT / REPLACE / REMOVE PACEMAKER  09/1997   Guidant VVI [employer]  . IR FLUORO GUIDE CV LINE RIGHT  03/12/2019  . IR FLUORO GUIDE CV LINE RIGHT  03/28/2019  . IR US [place] VASC ACCESS RIGHT  03/12/2019  . PACEMAKER GENERATOR CHANGE  01/2004  . RIGHT HEART CATH N/A  03/09/2019   Procedure: RIGHT HEART CATH;  Surgeon: Larey Dresser, MD;  Location: [employer] [facility] CV LAB;  Service: Cardiovascular;  Laterality: N/A;  . UPPER GASTROINTESTINAL ENDOSCOPY  FEB 2013   RING/DIL TO 16 MM, H PYLORI GASTRITIS     Allergies  Allergies  Allergen Reactions  . Wheat Swelling  . Latex Rash  . Penicillins Rash  . Sulfa Antibiotics Rash    History of Present Illness    59 year old female with the above  complex past medical history including congenital third-degree heart block status post remote pacer with subsequent upgrade to CRT-D in the setting of nonischemic cardiomyopathy with an EF of less than 20%, HFrEF, chronic atrial fibrillation on Eliquis, insulin-dependent diabetes mellitus, prior stroke, monoclonal gammopathy, relative hypotension, and chronic kidney disease with recent progression to end-stage renal disease requiring dialysis.  She was recently admitted to [facility] from January 10 to February 1.  She initially presented to [facility] with worsening lower extremity swelling and abdominal edema and 30 pound weight gain.  Serum creatinine had risen to 5.04 on admission.  She was transferred to [facility] for nephrology and advanced heart failure consultation.  In the setting of low output heart failure and cardiorenal syndrome, she was placed on dobutamine therapy at 2.5 mcg/kg/min.  Right heart catheterization showed right greater than left heart failure with a PA of 45/19 (28), wedge of 19, PVR of 1.5 Woods units, and PA PI of 1.4.  Cardiac output and index were 6.1 and 3.06 respectively.  In the setting of hypotension and worsening renal function, digoxin and ACE inhibitor therapy were discontinued.  Beta-blocker was stopped secondary to concern for low output.  She was not a candidate for hydralazine and nitrates given hypotension and need for midodrine.  She did require CVVH in the setting of intractable volume overload and her weight came down 50 pounds over the course of her admission.  Tunneled catheter was placed and arrangements were made for outpatient hemodialysis.  Prognosis was felt to be poor and palliative care was following.  Inpatient rehab versus skilled nursing facility was recommended however, she insisted on being discharged home with home health.  Following discharge, she says that she has been feeling well however, has been noticing increasing lower extremity swelling.   Since her first admission, she has had increased abdominal girth and generalized abdominal tenderness.  She has not noticed any change in dyspnea or orthopnea.  She says she has been weighing herself daily and does not know if she is gained or lost weight.  She has been compliant with dialysis and she believes that she has tolerated it well.  Unfortunately, due to persistent and worsening lower extremity swelling, she became concerned and presented back to the emergency department on February 5.  Here, creatinine was elevated at 6.18, she has persistent but stable anemia at 8.8/27.3.  BNP was elevated earlier this morning at 1922.6.  Chest x-ray shows cardiomegaly without acute cardiopulmonary process.  Currently she is very sleepy and groggy but in no distress.  Blood pressures have been soft in the 80s.  Inpatient Medications    . allopurinol  150 mg Oral Daily  . apixaban  5 mg Oral BID  . gabapentin  300 mg Oral Daily  . insulin aspart  0-9 Units Subcutaneous TID WC  . insulin glargine  56 Units Subcutaneous Daily  . midodrine  15 mg Oral TID WC  . multivitamin  1 tablet Oral QHS  Family History    Family History  Adopted: Yes  Problem Relation Age of Onset  . Autism Son   . Seizures Son   . Diabetes Daughter   . Colon cancer Neg Hx   . Colon polyps Neg Hx    is adopted.    Social History    Social History   Socioeconomic History  . Marital status: Married    Spouse name: Not on file  . Number of children: 4  . Years of education: Not on file  . Highest education level: Not on file  Occupational History    Employer: UNEMPLOYED  Tobacco Use  . Smoking status: Never Smoker  . Smokeless tobacco: Never Used  . Tobacco comment: Never smoked  Substance and Sexual Activity  . Alcohol use: No    Alcohol/week: 0.0 standard drinks  . Drug use: No  . Sexual activity: Not Currently    Birth control/protection: Post-menopausal  Other Topics Concern  . Not on file  Social  History Narrative  . Not on file   Social Determinants of Health   Financial Resource Strain:   . Difficulty of Paying Living Expenses: Not on file  Food Insecurity:   . Worried About Charity fundraiser in the Last Year: Not on file  . Ran Out of Food in the Last Year: Not on file  Transportation Needs:   . Lack of Transportation (Medical): Not on file  . Lack of Transportation (Non-Medical): Not on file  Physical Activity:   . Days of Exercise per Week: Not on file  . Minutes of Exercise per Session: Not on file  Stress:   . Feeling of Stress : Not on file  Social Connections:   . Frequency of Communication with Friends and Family: Not on file  . Frequency of Social Gatherings with Friends and Family: Not on file  . Attends Religious Services: Not on file  . Active Member of Clubs or Organizations: Not on file  . Attends Archivist Meetings: Not on file  . Marital Status: Not on file  Intimate Partner Violence:   . Fear of Current or Ex-Partner: Not on file  . Emotionally Abused: Not on file  . Physically Abused: Not on file  . Sexually Abused: Not on file     Review of Systems    General:  No chills, fever, night sweats or weight changes.  Cardiovascular:  No chest pain, +++ stable dyspnea on exertion, +++ inc bilat LE edema, +++ orthopnea, no palpitations, paroxysmal nocturnal dyspnea. Dermatological: No rash, lesions/masses Respiratory: No cough, +++ dyspnea Urologic: No hematuria, dysuria Abdominal:   +++ abd fullness w/ diffuse tenderness. No nausea, vomiting, diarrhea, bright red blood per rectum, melena, or hematemesis Neurologic:  No visual changes, wkns, changes in mental status. All other systems reviewed and are otherwise negative except as noted above.  Physical Exam    Blood pressure (!) 88/51, pulse (!) 59, temperature 98.4 F (36.9 C), temperature source Oral, resp. rate (!) 22, SpO2 100 %.  General: Pleasant, NAD.  Very groggy. Psych: Flat  affect. Neuro: Alert and oriented X 3. Moves all extremities spontaneously. HEENT: Normal  Neck: Supple without bruits. JVD to jaw. Lungs:  Resp regular and unlabored, diminished breath sounds on R and L base.  Otw CTA. Heart: RRR no s3, s4. 3/6 syst murmur throughout. Abdomen: Firm, diffusely tender, BS + x 4.  Extremities: No clubbing, cyanosis. 3+ bilat LE edema to mid-calves. DP/PT/Radials 2+ and equal  bilaterally.  Labs    Cardiac Enzymes Recent Labs  Lab 03/07/19 2101 03/07/19 2306  TROPONINIHS 28* 27*      Lab Results  Component Value Date   WBC 14.6 (H) 04/02/2019   HGB 8.8 (L) 04/02/2019   HCT 27.3 (L) 04/02/2019   MCV 83.2 04/02/2019   PLT 300 04/02/2019    Recent Labs  Lab 04/02/19 2118 04/03/19 0040  NA 127*  --   K 5.0  --   CL 86*  --   CO2 19*  --   BUN 62*  --   CREATININE 6.18*  --   CALCIUM 8.5*  --   PROT  --  7.5  BILITOT  --  7.0*  ALKPHOS  --  422*  ALT  --  40  AST  --  52*  GLUCOSE 226*  --    Lab Results  Component Value Date   CHOL 109 09/23/2018   HDL 30.10 (L) 09/23/2018   LDLCALC 59 09/23/2018   TRIG 101.0 09/23/2018   Lab Results  Component Value Date   DDIMER <0.27 11/30/2018     Radiology Studies   DG Chest 2 View  Result Date: 04/02/2019 CLINICAL DATA:  59 year female with shortness of breath and fluid overload. EXAM: CHEST - 2 VIEW COMPARISON:  Chest radiograph dated 03/18/2019. FINDINGS: Right-sided dialysis catheter in similar position. There is cardiomegaly. No vascular congestion or edema. No focal consolidation, pleural effusion, or pneumothorax. Atherosclerotic calcification of the aorta. Left pectoral AICD device. No acute osseous pathology. IMPRESSION: 1. No acute cardiopulmonary process. 2. Cardiomegaly. 3.  Aortic Atherosclerosis (ICD10-I70.0). Electronically Signed   By: Anner Crete M.D.   On: 04/02/2019 21:52    ECG & Cardiac Imaging    V paced, 60, underlying A. fib- personally  reviewed.  Assessment & Plan    1.  Acute on chronic systolic congestive heart failure/nonischemic cardiomyopathy: Recent prolonged admission from January 10 to February 1 secondary to worsening heart failure complicated by low output requiring a course of dobutamine therapy, cardiorenal syndrome, advancing renal failure requiring CVVH and subsequent plan for hemodialysis, and profound deconditioning with recommendation for inpatient rehab versus SNF.  Patient preferred to be discharged home.  Over the course of that hospitalization, weight came down approximately 50 pounds.  Echo during that admission showed an EF of less than 20%.  Right heart catheterization showed right greater than left heart pressures with pulmonary venous hypertension.  Though she carries a diagnosis of monoclonal gammopathy, LV morphology is not felt to be consistent with AL amyloidosis and prior bone marrow biopsy negative for amyloidosis.  Since discharge, despite compliance with hemodialysis, she has noticed increasing lower extremity swelling.  She has also had continued abdominal bloating and tenderness.  She was appropriately concerned that she was developing recurrent and worsening heart failure.  As result, she presented to the emergency department on February 5 where chest x-ray was relatively unremarkable.  She has stable anemia with mild high-sensitivity troponin elevation (28  27), and a BNP of 1922.6.  On examination, she does have significant lower extremity swelling as well as JVD to her jaw.  Breath sounds are diminished in bilateral bases and on the right in general.  She is due for dialysis today, though with ongoing hypotension, volume management will remain difficult.  She is not a candidate for beta-blocker in the setting of low output and hypotension.  No ACE/ARB/ARNI/MRA/hydralazine/nitrates in the setting of hypotension and renal failure.  Unfortunately, her  prognosis is very poor and she is not doing well off of  inotropic therapy.  That said, she is a poor candidate for long-term inotropic therapy in the setting of end-stage CHF and end-stage renal dzs, and will thus be best served with hospice.  2.  ESRD: Worsening renal failure during her last admission with requirement for CVVH and subsequent placement of tunneled catheter and outpatient dialysis, which she has participating in in [place] on a Tuesday/Thursday/Saturday regimen.  She is due today.  Nephrology consulted per internal medicine.  3.  Permanent atrial fibrillation: Rate controlled and ventricular paced at 60 bpm.  Anticoagulated w/ eliquis.  4.  Congenital CHB: CRT-D - V paced w/ underlying afib.  5.  Normocytic anemia: stable.  6.  Monoclonal Gammopathy: followed by hematology.  7.  Elevated HsTroponin:  28  27. Likely non-specific in setting of ESRD and low-output CHF.  This does not represent ACS.  Signed, Murray Hodgkins, NP 04/03/2019, 10:21 AM  For questions or updates, please contact   Please consult [email or web address] for contact info under Cardiology/STEMI.  Patient seen and examined with the above-signed Advanced Practice Provider and/or Housestaff. I personally reviewed laboratory data, imaging studies and relevant notes. I independently examined the patient and formulated the important aspects of the plan. I have edited the note to reflect any of my changes or salient points. I have personally discussed the plan with the patient and/or family.  59 y/o woman with multiple myeloma c/b ESRD and severe NICM. Recently with prolonged hospitalization with initiation of ESRD. Hospice recommended but refused.   Now back with marked volume overload in the setting of inability to get effective volume removal with HD due to CHF and low BP.   On exam Weak and ill appearing JVP to ear Cor RRR + prominent s3 Ab distended Ext 2+ edema  Unfortunately  wehave little more to offer here if unable to get effective volume removal with HD  and midodrine support the only other option is Hospice. I have explained this to her in detail. She is not candidate for advanced HF therapies. Would recommend deactivation of ICD and residential hospice.   Glori Bickers, MD  11:17 AM

## 2019-04-03 NOTE — ED Provider Notes (Signed)
[facility] Emergency Department Provider Note MRN:  [id number]  Arrival date & time: 04/03/19     Chief Complaint   Generalized swelling   History of Present Illness   [name] is a 59 y.o. year-old female with a history of CHF, CKD, A. fib presenting to the ED with chief complaint of general swelling.  Patient explains that she was admitted to the hospital for severe swelling and was discharged 5 days ago.  Ever since returning home, has been having return of leg in the abdominal swelling.  Trouble getting around the house due to the swelling.  Dyspnea exertion.  Denies chest pain, no fever, no cough, no other complaints.  Review of Systems  A complete 10 system review of systems was obtained and all systems are negative except as noted in the HPI and PMH.   Patient's Health History    Past Medical History:  Diagnosis Date  . Automatic implantable cardioverter-defibrillator in situ    a. 03/2013: BSX Energen CRTD BiV ICC, ser# [id number]  . Chronic anticoagulation    a. coumadin.  . Chronic atrial fibrillation (HCC)    embolic rind  . Chronic kidney disease    creatinine- 1.8 in 09/2006 and 2.0 in 05/2007; 2.05 in 2011, 2.29 in 2012  . Chronic systolic CHF (congestive heart failure) ([place])    a. 03/2013 Echo: Sev LV dysfxn with sev diff HK, restrictive phys, diast dysfxn, mild MR, sev dil LA/RA, mod PR, PASP 55mmHg.  . Congenital third degree heart block    a. Guidant VVI pacemaker implanted in 09/1997; b. gen change in 01/2004;  c. 03/2013 upgrade to [place], ser# [id number].  . Dizziness and giddiness 05/19/2012  . Dysphagia 08/14/2011   FEB 2013 EGD/DIL 16 MM; GERD; h/o gastroesophageal reflux disease; + H. Pylori gastritis   . GERD (gastroesophageal reflux disease)   . Helicobacter pylori gastritis 08/14/2011  . IDDM (insulin dependent diabetes mellitus)   . Kidney stones 1990's  . Nonischemic cardiomyopathy ([place])    a. 03/2013 Echo: Sev  LV dysfxn with sev diff HK, restrictive phys, diast dysfxn, mild MR, sev dil LA/RA, mod PR, PASP 75mmHg;  b. 03/2013: BSX Energen CRTD BiV ICC, ser# [id number].  Marland Kitchen Potassium (K) excess   . Stroke [facility]) 1999   denies residual on 04/21/2013    Past Surgical History:  Procedure Laterality Date  . BI-VENTRICULAR IMPLANTABLE CARDIOVERTER DEFIBRILLATOR  (CRT-D)  04/21/2013  . BI-VENTRICULAR IMPLANTABLE CARDIOVERTER DEFIBRILLATOR UPGRADE N/A 04/21/2013   Procedure: BI-VENTRICULAR IMPLANTABLE CARDIOVERTER DEFIBRILLATOR UPGRADE;  Surgeon: Evans Lance, MD;  Location: [facility] CATH LAB;  Service: Cardiovascular;  Laterality: N/A;  . COLONOSCOPY  04/23/2011   VVO:HYWVPXTGGY COLON POLYP/ INTERNAL HEMORRHOIDS/ TORTUOUS COLON  . INSERT / REPLACE / REMOVE PACEMAKER  09/1997   Guidant VVI [employer]  . IR FLUORO GUIDE CV LINE RIGHT  03/12/2019  . IR FLUORO GUIDE CV LINE RIGHT  03/28/2019  . IR US GUIDE VASC ACCESS RIGHT  03/12/2019  . PACEMAKER GENERATOR CHANGE  01/2004  . RIGHT HEART CATH N/A 03/09/2019   Procedure: RIGHT HEART CATH;  Surgeon: Larey Dresser, MD;  Location: [facility] CV LAB;  Service: Cardiovascular;  Laterality: N/A;  . UPPER GASTROINTESTINAL ENDOSCOPY  FEB 2013   RING/DIL TO 16 MM, H PYLORI GASTRITIS    Family History  Adopted: Yes  Problem Relation Age of Onset  . Autism Son   . Seizures Son   . Diabetes Daughter   .  Colon cancer Neg Hx   . Colon polyps Neg Hx     Social History   Socioeconomic History  . Marital status: Married    Spouse name: Not on file  . Number of children: 4  . Years of education: Not on file  . Highest education level: Not on file  Occupational History    Employer: UNEMPLOYED  Tobacco Use  . Smoking status: Never Smoker  . Smokeless tobacco: Never Used  . Tobacco comment: Never smoked  Substance and Sexual Activity  . Alcohol use: No    Alcohol/week: 0.0 standard drinks  . Drug use: No  . Sexual activity: Not Currently    Birth  control/protection: Post-menopausal  Other Topics Concern  . Not on file  Social History Narrative  . Not on file   Social Determinants of Health   Financial Resource Strain:   . Difficulty of Paying Living Expenses: Not on file  Food Insecurity:   . Worried About Charity fundraiser in the Last Year: Not on file  . Ran Out of Food in the Last Year: Not on file  Transportation Needs:   . Lack of Transportation (Medical): Not on file  . Lack of Transportation (Non-Medical): Not on file  Physical Activity:   . Days of Exercise per Week: Not on file  . Minutes of Exercise per Session: Not on file  Stress:   . Feeling of Stress : Not on file  Social Connections:   . Frequency of Communication with Friends and Family: Not on file  . Frequency of Social Gatherings with Friends and Family: Not on file  . Attends Religious Services: Not on file  . Active Member of Clubs or Organizations: Not on file  . Attends Archivist Meetings: Not on file  . Marital Status: Not on file  Intimate Partner Violence:   . Fear of Current or Ex-Partner: Not on file  . Emotionally Abused: Not on file  . Physically Abused: Not on file  . Sexually Abused: Not on file     Physical Exam   Vitals:   04/03/19 0115 04/03/19 0215  BP: (!) 104/57 (!) 103/54  Pulse: 60 (!) 59  Resp:  19  Temp:    SpO2: 99% 100%    CONSTITUTIONAL: Chronically ill-appearing, NAD NEURO:  Alert and oriented x 3, no focal deficits EYES:  eyes equal and reactive ENT/NECK:  no LAD, no JVD CARDIO: Regular rate, well-perfused, normal S1 and S2 PULM:  CTAB no wheezing or rhonchi GI/GU:  normal bowel sounds, non-distended, non-tender MSK/SPINE:  No gross deformities SKIN:  no rash, atraumatic; pitting edema to the legs and abdominal soft tissue PSYCH:  Appropriate speech and behavior  *Additional and/or pertinent findings included in MDM below  Diagnostic and Interventional Summary    EKG  Interpretation  Date/Time:  Saturday April 03 2019 02:22:34 EST Ventricular Rate:  60 PR Interval:    QRS Duration: 171 QT Interval:  523 QTC Calculation: 523 R Axis:   -62 Text Interpretation: Afib/flutter and ventricular-paced rhythm No further analysis attempted due to paced rhythm Confirmed by Gerlene Fee [phone]) on 04/03/2019 2:31:37 AM      Cardiac Monitoring Interpretation:  Labs Reviewed  BASIC METABOLIC PANEL - Abnormal; Notable for the following components:      Result Value   Sodium 127 (*)    Chloride 86 (*)    CO2 19 (*)    Glucose, Bld 226 (*)    BUN 62 (*)  Creatinine, Ser 6.18 (*)    Calcium 8.5 (*)    GFR calc non Af Amer 7 (*)    GFR calc Af Amer 8 (*)    Anion gap 22 (*)    All other components within normal limits  CBC - Abnormal; Notable for the following components:   WBC 14.6 (*)    RBC 3.28 (*)    Hemoglobin 8.8 (*)    HCT 27.3 (*)    RDW 22.0 (*)    nRBC 0.9 (*)    All other components within normal limits  HEPATIC FUNCTION PANEL - Abnormal; Notable for the following components:   Albumin 2.1 (*)    AST 52 (*)    Alkaline Phosphatase 422 (*)    Total Bilirubin 7.0 (*)    Bilirubin, Direct 4.1 (*)    Indirect Bilirubin 2.9 (*)    All other components within normal limits  BRAIN NATRIURETIC PEPTIDE - Abnormal; Notable for the following components:   B Natriuretic Peptide 1,922.6 (*)    All other components within normal limits  SARS CORONAVIRUS 2 (TAT 6-24 HRS)    DG Chest 2 View  Final Result      Medications - No data to display   Procedures  /  Critical Care Procedures  ED Course and Medical Decision Making  I have reviewed the triage vital signs, the nursing notes, and pertinent available records from the EMR.  Pertinent labs & imaging results that were available during my care of the patient were reviewed by me and considered in my medical decision making (see below for details).     Return of anasarca in this  59 year old female with significantly reduced EF, CHF, CKD newly placed on dialysis.  Has been rapidly putting on weight and with increased edema ever since discharge 5 days ago.  Dyspnea exertion, trouble getting around the house.  Per chart review they recommended inpatient rehab but she wanted to go home instead.  Awaiting a few more laboratory tests, anticipating consultation to [facility] service for admission.  2:30 AM update: BNP is markedly elevated, will admit to hospital service.  Barth Kirks. Sedonia Small, MD [place] [email or web address]  Final Clinical Impressions(s) / ED Diagnoses     ICD-10-CM   1. Anasarca  R60.1   2. Hypervolemia, unspecified hypervolemia type  E87.70     ED Discharge Orders    None       Discharge Instructions Discussed with and Provided to Patient:   Discharge Instructions   None       Maudie Flakes, MD 04/03/19 [phone]

## 2019-04-04 LAB — CBC
HCT: 25.3 % — ABNORMAL LOW (ref 36.0–46.0)
Hemoglobin: 8.4 g/dL — ABNORMAL LOW (ref 12.0–15.0)
MCH: 27.6 pg (ref 26.0–34.0)
MCHC: 33.2 g/dL (ref 30.0–36.0)
MCV: 83.2 fL (ref 80.0–100.0)
Platelets: 269 10*3/uL (ref 150–400)
RBC: 3.04 MIL/uL — ABNORMAL LOW (ref 3.87–5.11)
RDW: 22.1 % — ABNORMAL HIGH (ref 11.5–15.5)
WBC: 13.7 10*3/uL — ABNORMAL HIGH (ref 4.0–10.5)
nRBC: 1.2 % — ABNORMAL HIGH (ref 0.0–0.2)

## 2019-04-04 LAB — BASIC METABOLIC PANEL
Anion gap: 14 (ref 5–15)
BUN: 31 mg/dL — ABNORMAL HIGH (ref 6–20)
CO2: 23 mmol/L (ref 22–32)
Calcium: 8.1 mg/dL — ABNORMAL LOW (ref 8.9–10.3)
Chloride: 93 mmol/L — ABNORMAL LOW (ref 98–111)
Creatinine, Ser: 3.73 mg/dL — ABNORMAL HIGH (ref 0.44–1.00)
GFR calc Af Amer: 15 mL/min — ABNORMAL LOW (ref >60)
GFR calc non Af Amer: 13 mL/min — ABNORMAL LOW (ref >60)
Glucose, Bld: 74 mg/dL (ref 70–99)
Potassium: 3.8 mmol/L (ref 3.5–5.1)
Sodium: 130 mmol/L — ABNORMAL LOW (ref 135–145)

## 2019-04-04 LAB — GLUCOSE, CAPILLARY
Glucose-Capillary: 72 mg/dL (ref 70–99)
Glucose-Capillary: 112 mg/dL — ABNORMAL HIGH (ref 70–99)
Glucose-Capillary: 158 mg/dL — ABNORMAL HIGH (ref 70–99)
Glucose-Capillary: 139 mg/dL — ABNORMAL HIGH (ref 70–99)
Glucose-Capillary: 148 mg/dL — ABNORMAL HIGH (ref 70–99)

## 2019-04-04 MED ORDER — TRAMADOL HCL 50 MG PO TABS
50.0000 mg | ORAL_TABLET | Freq: Once | ORAL | Status: AC
  Administered 2019-04-04: 03:00:00 50 mg via ORAL
  Filled 2019-04-04: qty 1

## 2019-04-04 MED ORDER — INSULIN GLARGINE 100 UNIT/ML ~~LOC~~ SOLN
25.0000 [IU] | Freq: Every day | SUBCUTANEOUS | Status: DC
  Administered 2019-04-04: 21:00:00 25 [IU] via SUBCUTANEOUS
  Filled 2019-04-04 (×2): qty 0.25

## 2019-04-04 MED ORDER — SENNOSIDES-DOCUSATE SODIUM 8.6-50 MG PO TABS
1.0000 | ORAL_TABLET | Freq: Every evening | ORAL | Status: DC | PRN
  Administered 2019-04-04: 20:00:00 1 via ORAL
  Filled 2019-04-04: qty 1

## 2019-04-04 NOTE — Progress Notes (Signed)
Pt continued to have pain in legs and abdomen.  Repositioned pt. Not effective. Applied heat packs. Not effective Applied cold towels. Not effective. Paged provider for pain medication.

## 2019-04-04 NOTE — Progress Notes (Signed)
   Patient unable to tolerate full HD yesterday due to hypotension. Remains volume overloaded. Very uncomfortable with leg and abdominal pain overnight.   Unfortunately we have little to offer her from a Cardiology standpoint.   As per my Consult note from yesterday, recommend Hospice as the only option.   We will sign off. Please call with questions.   Glori Bickers, MD  8:41 AM

## 2019-04-04 NOTE — Progress Notes (Addendum)
[facility] Progress Note   Subjective:   Patient seen and examined at bedside with husband present.  She is sleepy and awakes to touch.  Per husband continues to have pain in abdomen and legs.  Tolerated dialysis yesterday, but only able to remove 2L of fluid.  Discussed with husband hat her low BP is making if difficult to remove volume.  Wants to continue dialysis.   Objective Vitals:   04/03/19 2030 04/04/19 0041 04/04/19 0437 04/04/19 0550  BP: (!) 101/56 (!) 98/54  (!) 98/59  Pulse: (!) 57 60  60  Resp: _0 Temp: 98.5 F (36.9 C) 97.8 F (36.6 C)  97.8 F (36.6 C)  TempSrc: Oral Oral  Oral  SpO2: 98% 96%  99%  Weight:    71.1 kg  Height:   _1  (1.676 m)    Physical Exam General:NAD, chronically ill appearing female, laying in bed Heart:RRR, +4/4 systolic murmur Lungs:CTAB, nml WOB on RA Abdomen:firm w/orange peel appearance, +diffuse tenderness Extremities:2+ LE edema, +tenderness Dialysis Access: [facility]   Filed Weights   04/03/19 1835 04/04/19 0550  Weight: 68.8 kg 71.1 kg    Intake/Output Summary (Last 24 hours) at 04/04/2019 1116 Last data filed at 04/04/2019 0209 Gross per 24 hour  Intake 480 ml  Output 2000 ml  Net -1520 ml    Additional Objective Labs: Basic Metabolic Panel: Recent Labs  Lab 03/29/19 0723 03/29/19 0723 03/29/19 1231 04/02/19 2118 04/04/19 0613  NA 119*   < > 128* 127* 130*  K 5.5*   < > 3.9 5.0 3.8  CL 77*   < > 89* 86* 93*  CO2 22   < > 22 19* 23  GLUCOSE 211*   < > 134* 226* 74  BUN 78*   < > 21* 62* 31*  CREATININE 6.48*   < > 2.75* 6.18* 3.73*  CALCIUM 8.9   < > 8.6* 8.5* 8.1*  PHOS 6.2*  --   --   --   --    < > = values in this interval not displayed.   Liver Function Tests: Recent Labs  Lab 03/29/19 0723 04/03/19 0040  AST  --  52*  ALT  --  40  ALKPHOS  --  422*  BILITOT  --  7.0*  PROT  --  7.5  ALBUMIN 1.9* 2.1*   CBC: Recent Labs  Lab 03/29/19 0723 04/02/19 2118 04/04/19 0613   WBC 11.7* 14.6* 13.7*  HGB 8.6* 8.8* 8.4*  HCT 25.0* 27.3* 25.3*  MCV 79.9* 83.2 83.2  PLT 237 300 269   CBG: Recent Labs  Lab 04/03/19 0756 04/03/19 1208 04/03/19 2140 04/04/19 0555 04/04/19 0942  GLUCAP 199* 248* 118* 72 112*   Studies/Results: DG Chest 2 View  Result Date: 04/02/2019 CLINICAL DATA:  59 year female with shortness of breath and fluid overload. EXAM: CHEST - 2 VIEW COMPARISON:  Chest radiograph dated 03/18/2019. FINDINGS: Right-sided dialysis catheter in similar position. There is cardiomegaly. No vascular congestion or edema. No focal consolidation, pleural effusion, or pneumothorax. Atherosclerotic calcification of the aorta. Left pectoral AICD device. No acute osseous pathology. IMPRESSION: 1. No acute cardiopulmonary process. 2. Cardiomegaly. 3.  Aortic Atherosclerosis (ICD10-I70.0). Electronically Signed   By: Anner Crete M.D.   On: 04/02/2019 21:52    Medications: . albumin human 12.5 g (04/03/19 1535)   . allopurinol  150 mg Oral Daily  . apixaban  5 mg Oral BID  . Chlorhexidine Gluconate  Cloth  6 each Topical Q0600  . gabapentin  300 mg Oral Daily  . insulin aspart  0-9 Units Subcutaneous TID WC  . insulin glargine  25 Units Subcutaneous QHS  . midodrine  15 mg Oral TID WC  . multivitamin  1 tablet Oral QHS    Dialysis Orders: TTS - [facility] 3.5hrs->4hrs 300/500->400/800 2K 2Ca Profile 4 TDC Hep 5700 unit bolus  Assessment/Plan: 1. Anasarca  - significant edema in lower body.  CXR shows no pulmonary edema.  HD yesterday with net UF 2L removed.  Significant hypotension limits UF, on 24m midodrine TID. Will use albumin and midodrine to help facilitate volume removal on dialysis. Titrate down as tolerated.  2. Acute on chronic systolic HF/NICM - significant disease and difficultly with medical management due to low output, hypotension and renal disease.  Very poor prognosis.  Per cardio best served with hospice. HF team signed off.  2.  ESRD - On HD TTS, new start. Patient/family would like to continue dialysis. Volume overloaded but not in respiratory distress.  Plan to resume regular schedule with next HD 2/9. 3. Anemia of CKD- Hgb 8.4. Not on ESA currently.Follow trends.  4. Secondary hyperparathyroidism - Ca ok.  Will check phos. Not on VDRA/binders. 5. Hypotension - Chronic. On midodrine 167mTID.  Will give does mid HD if needed.  Albumin with HD for BP support.  6. Nutrition - Renal diet w/fluid restrictions 7. Multiple myeloma - new Dx during last admission.  Scheduled for f/u as OP on 2/10. 8. Warfarin skin necrosis - punch bx neg for calciphylaxis  LiJen MowPA-C [facility] Pager: [phone]/08/2019,11:16 AM  LOS: 1 day   Nephrology attending: Patient was seen and examined at bedside.  Chart reviewed.  I agree with assessment and plan as outlined above.  ESRD on HD TTS, decompensated heart failure, on dialysis.  Overall poor prognosis.  Had dialysis yesterday and plan for next HD on 2/9.  Recommend palliative and hospice care consult. Discussed with the patient's husband at bedside.  DrLawson RadarMD  [facility]

## 2019-04-04 NOTE — Progress Notes (Signed)
[facility]                                                                              Patient Demographics  [name], is a 59 y.o. female, DOB - 08/03/1960, YDX:[id number]  Admit date - 04/02/2019   Admitting Physician Rise Patience, MD  Outpatient Primary MD for the patient is [name], MD  Outpatient specialists:   LOS - 1  days    Chief Complaint  Patient presents with   Generalized swelling       Brief summary  59 year old lady with history of nonischemic cardiomyopathy with systolic dysfunction, HM09- 20% status post CRT-D, end-stage on hemodialysis, insulin-dependent diabetes mellitus and chronic atrial fibrillation presenting with progressively worsening generalized swelling and admitted for acute on chronic systolic CHF.  She was recently discharged on 03/29/2019 following hospitalization due to volume overload/acute CHF,, AKI on CKD now ESRD, initially started on CRRT, then transitioned to HD.    Inpatient rehab versus SNF was recommended but patient insisted on being discharge home with home health.   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)   ATRIAL FIBRILLATION, CHRONIC   Cardiorenal syndrome/chronic kidney disease   Congenital third degree heart block   Acute on chronic systolic CHF (congestive heart failure), NYHA class 4 (HCC)   Acute respiratory failure with hypoxia (HCC)   ESRD (end stage renal disease) (HCC)   CHF (congestive heart failure) (HCC)  Acute on chronic systolic CHF decompensation Nephrology consulted with plans for ultrafiltration during hemodialysis-high risk for hypotension and midodrine will follow up with supportive measures per nephrology during hemodialysis - goal of 3 l fluid wid at HD not reached yesterday due to sof BP Not candidate for advanced heart failure therapies and hospice accompanied by cardiology Palliative medicine  consulted  Chronic atrial fibrillation Continue present plan  Severe protein calorie malnutrition Nutritional support optimization Nutritionist/dietitian consulted  Type 2 diabetes mellitus Glycemic control    Code Status: DNI, no intubation DVT Prophylaxis: On apixaban Family Communication: Discussed in detail with the patient, all imaging results, lab results explained to the patient and husband at bedside   Disposition Plan: To be determined  Time Spent in minutes 25 minutes  Procedures:   Consultants:   Nephrology Cardiology  Antimicrobials:      Medications  Scheduled Meds:  allopurinol  150 mg Oral Daily   apixaban  5 mg Oral BID   Chlorhexidine Gluconate Cloth  6 each Topical Q0600   gabapentin  300 mg Oral Daily   insulin aspart  0-9 Units Subcutaneous TID WC   insulin glargine  56 Units Subcutaneous Daily   midodrine  15 mg Oral TID WC   multivitamin  1 tablet Oral QHS   Continuous Infusions:  albumin human 12.5 g (04/03/19 1535)   PRN Meds:.acetaminophen **OR** acetaminophen, albumin human, ondansetron **OR** ondansetron (ZOFRAN) IV, traMADol   Antibiotics   Anti-infectives (From admission, onward)   None        Subjective:   [name] was seen and examined today.   Remains  edematous with leg pains  Objective:   Vitals:   04/03/19 2030 04/04/19 0041 04/04/19 0437 04/04/19 0550  BP: (!) 101/56 (!) 98/54  (!) 98/59  Pulse: (!) 57 60  60  Resp: 18 16  16   Temp: 98.5 F (36.9 C) 97.8 F (36.6 C)  97.8 F (36.6 C)  TempSrc: Oral Oral  Oral  SpO2: 98% 96%  99%  Weight:    71.1 kg  Height:   5\' 6"  (1.676 m)     Intake/Output Summary (Last 24 hours) at 04/04/2019 0160 Last data filed at 04/04/2019 0209 Gross per 24 hour  Intake 480 ml  Output 2000 ml  Net -1520 ml     Wt Readings from Last 3 Encounters:  04/04/19 71.1 kg  03/29/19 73.9 kg  03/03/19 87.5 kg     Exam  General: NAD  HEENT: NCAT,   PERRL,MMM  Neck: SUPPLE, (+) JVD  Cardiovascular: RRR, (-) GALLOP, (+) 3/6 colic MURMUR  Respiratory: Bibasilar decreased breath sounds  Gastrointestinal: SOFT, (-) DISTENSION, BS(+), mild (+) TENDERNESS  Ext: (-) CYANOSIS, (3+) EDEMA  Neuro: A, OX 3     Data Reviewed:  I have personally reviewed following labs and imaging studies  Micro Results Recent Results (from the past 240 hour(s))  SARS CORONAVIRUS 2 (TAT 6-24 HRS) Nasopharyngeal Nasopharyngeal Swab     Status: None   Collection Time: 04/03/19  1:04 AM   Specimen: Nasopharyngeal Swab  Result Value Ref Range Status   SARS Coronavirus 2 NEGATIVE NEGATIVE Final    Comment: (NOTE) SARS-CoV-2 target nucleic acids are NOT DETECTED. The SARS-CoV-2 RNA is generally detectable in upper and lower respiratory specimens during the acute phase of infection. Negative results do not preclude SARS-CoV-2 infection, do not rule out co-infections with other pathogens, and should not be used as the sole basis for treatment or other patient management decisions. Negative results must be combined with clinical observations, patient history, and epidemiological information. The expected result is Negative. Fact Sheet for Patients: SugarRoll.be Fact Sheet for Healthcare Providers: https://www.woods-mathews.com/ This test is not yet approved or cleared by the [place] FDA and  has been authorized for detection and/or diagnosis of SARS-CoV-2 by FDA under an Emergency Use Authorization (EUA). This EUA will remain  in effect (meaning this test can be used) for the duration of the COVID-19 declaration under Section 56 4(b)(1) of the Act, 21 U.S.C. section 360bbb-3(b)(1), unless the authorization is terminated or revoked sooner. Performed at [facility], [place] [address], [place], [place] [address]     Radiology Reports CT ABDOMEN PELVIS WO CONTRAST  Result Date: 03/08/2019 CLINICAL  DATA:  Abdominal distension EXAM: CT ABDOMEN AND PELVIS WITHOUT CONTRAST TECHNIQUE: Multidetector CT imaging of the abdomen and pelvis was performed following the standard protocol without IV contrast. COMPARISON:  None. FINDINGS: LOWER CHEST: Cardiomegaly bibasilar atelectasis. HEPATOBILIARY: Normal hepatic contours. No intra- or extrahepatic biliary dilatation. There is cholelithiasis without acute inflammation. PANCREAS: Normal pancreas. No ductal dilatation or peripancreatic fluid collection. SPLEEN: Normal. ADRENALS/URINARY TRACT: The adrenal glands are normal. No hydronephrosis, nephroureterolithiasis or solid renal mass. The urinary bladder is normal for degree of distention STOMACH/BOWEL: There is no hiatal hernia. Normal duodenal course and caliber. No small bowel dilatation or inflammation. No focal colonic abnormality. Not visualized. No right lower quadrant inflammation or free fluid. VASCULAR/LYMPHATIC: There is calcific atherosclerosis of the abdominal aorta. No abdominal or pelvic lymphadenopathy. REPRODUCTIVE: Normal uterus and ovaries. MUSCULOSKELETAL. No bony spinal canal stenosis or focal  osseous abnormality. OTHER: Anasarca. IMPRESSION: 1. No acute abnormality of the abdomen or pelvis. 2. Cardiomegaly and calcific aortic atherosclerosis (ICD10-I70.0). 3. Diffuse anasarca. Electronically Signed   By: Ulyses Jarred M.D.   On: 03/08/2019 02:02   DG Chest 2 View  Result Date: 04/02/2019 CLINICAL DATA:  59 year female with shortness of breath and fluid overload. EXAM: CHEST - 2 VIEW COMPARISON:  Chest radiograph dated 03/18/2019. FINDINGS: Right-sided dialysis catheter in similar position. There is cardiomegaly. No vascular congestion or edema. No focal consolidation, pleural effusion, or pneumothorax. Atherosclerotic calcification of the aorta. Left pectoral AICD device. No acute osseous pathology. IMPRESSION: 1. No acute cardiopulmonary process. 2. Cardiomegaly. 3.  Aortic Atherosclerosis  (ICD10-I70.0). Electronically Signed   By: Anner Crete M.D.   On: 04/02/2019 21:52   CT HEAD WO CONTRAST  Result Date: 03/24/2019 CLINICAL DATA:  Fall on heparin. EXAM: CT HEAD WITHOUT CONTRAST TECHNIQUE: Contiguous axial images were obtained from the base of the skull through the vertex without intravenous contrast. COMPARISON:  03/13/2012 FINDINGS: Brain: No evidence of acute infarction, hemorrhage, hydrocephalus, extra-axial collection or mass lesion/mass effect. Vascular: No hyperdense vessel or unexpected calcification. Skull: Normal. Negative for fracture or focal lesion. Sinuses/Orbits: No acute finding. IMPRESSION: Negative head CT. Electronically Signed   By: Monte Fantasia M.D.   On: 03/24/2019 04:58   CT CHEST WO CONTRAST  Result Date: 03/19/2019 CLINICAL DATA:  Hypoxemia and dyspnea. EXAM: CT CHEST WITHOUT CONTRAST TECHNIQUE: Multidetector CT imaging of the chest was performed following the standard protocol without IV contrast. COMPARISON:  Chest radiograph from one day prior. FINDINGS: Cardiovascular: Moderate cardiomegaly. No significant pericardial effusion/thickening. Left anterior descending coronary atherosclerosis. 3 lead left subclavian ICD is noted with lead tips in coronary sinus and right ventricle. Right internal jugular central venous catheter terminates at the cavoatrial junction. Atherosclerotic thoracic aorta with ectatic 4.4 cm ascending thoracic aorta. Dilated main pulmonary artery (3.8 cm diameter). Mediastinum/Nodes: No discrete thyroid nodules. Unremarkable esophagus. No axillary adenopathy. Enlarged 1.4 cm left prevascular node (series 3/image 43). No additional pathologically enlarged mediastinal nodes. No discrete hilar adenopathy on these noncontrast images. Lungs/Pleura: No pneumothorax. No pleural effusion. Mild platelike scarring versus atelectasis in posterior left lower lobe. No acute consolidative airspace disease, lung masses or significant pulmonary  nodules. Upper abdomen: Finely irregular liver surface, cannot exclude cirrhosis. Simple exophytic 1.5 cm upper left renal cyst. Musculoskeletal: No aggressive appearing focal osseous lesions. Moderate thoracic spondylosis. IMPRESSION: 1. Moderate cardiomegaly.  One vessel coronary atherosclerosis. 2. Dilated main pulmonary artery, suggesting pulmonary arterial hypertension. 3. Mild platelike scarring versus atelectasis at the left lung base. Otherwise no active pulmonary disease. No pleural effusions. 4. Nonspecific mild left prevascular mediastinal lymphadenopathy. 5. Ectatic 4.4 cm ascending thoracic aorta. Recommend annual imaging followup by CTA or MRA. This recommendation follows 2010 ACCF/AHA/AATS/ACR/ASA/SCA/SCAI/SIR/STS/SVM Guidelines for the Diagnosis and Management of Patients with Thoracic Aortic Disease. Circulation. 2010; 121: N629-B284. Aortic aneurysm NOS (ICD10-I71.9). 6. Finely irregular liver surface, cannot exclude cirrhosis. Aortic Atherosclerosis (ICD10-I70.0). Electronically Signed   By: Ilona Sorrel M.D.   On: 03/19/2019 09:05   CARDIAC CATHETERIZATION  Result Date: 03/09/2019 1. Right > left heart failure with low PAPI. 2. Pulmonary venous hypertension. 3. Adequate cardiac output on dobutamine 2.5 mcg/kg/min  CT FEMUR RIGHT WO CONTRAST  Result Date: 03/08/2019 CLINICAL DATA:  Upper leg trauma, right thigh swelling EXAM: CT OF THE LOWER RIGHT EXTREMITY WITHOUT CONTRAST TECHNIQUE: Multidetector CT imaging of the right lower extremity was performed according to the standard  protocol. COMPARISON:  None. FINDINGS: Bones/Joint/Cartilage No fracture or dislocation. There is moderate right hip osteoarthritis with superior joint space loss and marginal osteophyte formation. No periosteal reaction or cortical destruction. No large knee or hip joint effusion is seen. Ligaments Suboptimally assessed by CT. Muscles and Tendons The enthesopathy seen at the hamstrings insertion site. The remainder  of the visualized portion of the tendons appear to be intact. The muscles surrounding the thigh appear to be intact. Soft tissues There is diffuse skin thickening and subcutaneous edema seen surrounding the right lower extremity. Overlying the greater trochanter adjacent to the deep fascial layer there is thin linear area of fluid. The fluid does not appear to be encapsulated however. Dense vascular calcifications seen within the deep pelvis. IMPRESSION: 1. No acute osseous abnormality or soft tissue hematoma. 2. Thin non loculated fluid seen adjacent to the deep fascial layers/iliotibial tibial tract overlying the greater trochanter which could represent a posttraumatic seroma/Mild Sherry Ruffing lesion. 3. Diffuse subcutaneous edema and skin thickening surrounding the right lower extremity. Electronically Signed   By: Prudencio Pair M.D.   On: 03/08/2019 02:09   IR Fluoro Guide CV Line Right  Result Date: 03/28/2019 INDICATION: End-stage renal disease. Please convert temporary dialysis catheter to a tunneled/permanent dialysis catheter for the continuation of dialysis. EXAM: FLUOROSCOPIC GUIDED CONVERSION OF NON TUNNELED TO TUNNELED CENTRAL VENOUS HEMODIALYSIS CATHETER COMPARISON:  Image guided temporary dialysis catheter placement-03/12/2019 MEDICATIONS: None ANESTHESIA/SEDATION: Moderate (conscious) sedation was employed during this procedure. A total of Versed 0.5 mg and Fentanyl 25 mcg was administered intravenously. Moderate Sedation Time: 11 minutes. The patient's level of consciousness and vital signs were monitored continuously by radiology nursing throughout the procedure under my direct supervision. FLUOROSCOPY TIME:  24 seconds (2 mGy) COMPLICATIONS: None immediate. PROCEDURE: Informed written consent was obtained from the patient after a discussion of the risks, benefits, and alternatives to treatment. Questions regarding the procedure were encouraged and answered. The skin and external portion of  the existing hemodialysis catheter was prepped with chlorhexidine in a sterile fashion, and a sterile drape was applied covering the operative field. Maximum barrier sterile technique with sterile gowns and gloves were used for the procedure. A timeout was performed prior to the initiation of the procedure. A lumen of the none tunneled temporary dialysis catheter was cannulated with a stiff Glidewire in utilized for measurement purposes. Next, the Glidewire was advanced to the level of the IVC. A 19 cm tip to cuff HemoSplit dialysis catheter was tunneled from a site along the anterior chest to the venotomy site. Under intermittent fluoroscopic guidance, the temporary dialysis catheter was exchanged for a peel-away sheath. The tunneled dialysis catheter was inserted into the peel-away sheath with tips open terminating within in the superior aspect the right atrium. Final catheter positioning was confirmed and documented with a spot fluoroscopic image. The catheter aspirates and flushes normally. The catheter was flushed with appropriate volume heparin dwells. The catheter exit site was secured with a 0-Prolene retention sutures. The venotomy site was apposed with derma bond Steri-Strips. Both lumens were heparinized. A dressing was placed. The patient tolerated the procedure well without immediate post procedural complication. IMPRESSION: Successful fluoroscopic guided conversion of a temporary to a permanent 19 cm tip to cuff tunneled hemodialysis catheter with tips terminating within the right atrium. The catheter is ready for immediate use. Electronically Signed   By: Sandi Mariscal M.D.   On: 03/28/2019 10:55   IR Fluoro Guide CV Line Right  Result Date: 03/12/2019  INDICATION: 59 year old female with acute kidney injury in need of hemodialysis. She presents for placement of a temporary hemodialysis catheter. EXAM: IR ULTRASOUND GUIDANCE VASC ACCESS RIGHT; IR RIGHT FLUORO GUIDE CV LINE MEDICATIONS: None.  ANESTHESIA/SEDATION: None. FLUOROSCOPY TIME:  Fluoroscopy Time: 0 minutes 30 seconds (2.5 mGy). COMPLICATIONS: None immediate. PROCEDURE: Informed written consent was obtained from the patient after a thorough discussion of the procedural risks, benefits and alternatives. All questions were addressed. Maximal Sterile Barrier Technique was utilized including caps, mask, sterile gowns, sterile gloves, sterile drape, hand hygiene and skin antiseptic. A timeout was performed prior to the initiation of the procedure. The right internal jugular vein was interrogated with ultrasound and found to be widely patent. An image was obtained and stored for the medical record. Local anesthesia was attained by infiltration with 1% lidocaine. A small dermatotomy was made. Under real-time sonographic guidance, the vessel was punctured with a 21 gauge micropuncture needle. Using standard technique, the initial micro needle was exchanged over a 0.018 micro wire for a transitional 4 [place] micro sheath. The micro sheath was then exchanged over a 0.035 wire for a fascial dilator. The soft tissue tract was then dilated. A 16 cm triple-lumen temporary hemodialysis catheter was then advanced over the wire and positioned with the catheter tip in the upper right atrium. Catheter flushes and aspirates easily. The catheter was flushed with heparinized saline, capped and secured to the skin with 0 Prolene suture. A sterile bandage was applied. IMPRESSION: Successful placement of a right IJ approach 16 cm non tunneled triple-lumen hemodialysis catheter. The catheter tips are in the upper right atrium and the catheter is ready for immediate use. Signed, Criselda Peaches, MD, [facility] Radiology Electronically Signed   By: Jacqulynn Cadet M.D.   On: 03/12/2019 16:20   IR US Guide Vasc Access Right  Result Date: 03/12/2019 INDICATION: 59 year old female with acute kidney injury in need  of hemodialysis. She presents for placement of a temporary hemodialysis catheter. EXAM: IR ULTRASOUND GUIDANCE VASC ACCESS RIGHT; IR RIGHT FLUORO GUIDE CV LINE MEDICATIONS: None. ANESTHESIA/SEDATION: None. FLUOROSCOPY TIME:  Fluoroscopy Time: 0 minutes 30 seconds (2.5 mGy). COMPLICATIONS: None immediate. PROCEDURE: Informed written consent was obtained from the patient after a thorough discussion of the procedural risks, benefits and alternatives. All questions were addressed. Maximal Sterile Barrier Technique was utilized including caps, mask, sterile gowns, sterile gloves, sterile drape, hand hygiene and skin antiseptic. A timeout was performed prior to the initiation of the procedure. The right internal jugular vein was interrogated with ultrasound and found to be widely patent. An image was obtained and stored for the medical record. Local anesthesia was attained by infiltration with 1% lidocaine. A small dermatotomy was made. Under real-time sonographic guidance, the vessel was punctured with a 21 gauge micropuncture needle. Using standard technique, the initial micro needle was exchanged over a 0.018 micro wire for a transitional 4 [place] micro sheath. The micro sheath was then exchanged over a 0.035 wire for a fascial dilator. The soft tissue tract was then dilated. A 16 cm triple-lumen temporary hemodialysis catheter was then advanced over the wire and positioned with the catheter tip in the upper right atrium. Catheter flushes and aspirates easily. The catheter was flushed with heparinized saline, capped and secured to the skin with 0 Prolene suture. A sterile bandage was applied. IMPRESSION: Successful placement of a right IJ approach 16 cm non tunneled triple-lumen hemodialysis catheter. The catheter tips are in the upper right  atrium and the catheter is ready for immediate use. Signed, Criselda Peaches, MD, [facility] Radiology Electronically  Signed   By: Jacqulynn Cadet M.D.   On: 03/12/2019 16:20   DG CHEST PORT 1 VIEW  Result Date: 03/18/2019 CLINICAL DATA:  Shortness of breath EXAM: PORTABLE CHEST 1 VIEW COMPARISON:  03/17/2019 FINDINGS: Right-sided IJ approach central venous catheter with distal tip terminating at the level of the superior cavoatrial junction. Stable positioning of left-sided implanted cardiac device. Stable cardiomegaly. Calcified thoracic aorta. No new focal airspace consolidation, pleural effusion, or pneumothorax. IMPRESSION: Stable exam.  No active disease. Electronically Signed   By: Davina Poke D.O.   On: 03/18/2019 10:19   DG CHEST PORT 1 VIEW  Result Date: 03/17/2019 CLINICAL DATA:  CHF EXAM: PORTABLE CHEST 1 VIEW COMPARISON:  03/07/2019 FINDINGS: Cardiac enlargement with AICD. Negative for heart failure. Atherosclerotic aortic arch. Lungs are clear without infiltrate or effusion. IMPRESSION: No active disease. Electronically Signed   By: Franchot Gallo M.D.   On: 03/17/2019 10:58   DG Chest Portable 1 View  Result Date: 03/07/2019 CLINICAL DATA:  Shortness of breath, weakness EXAM: PORTABLE CHEST 1 VIEW COMPARISON:  06/25/2017 FINDINGS: Left AICD remains in place, unchanged. Cardiomegaly. No confluent opacities or effusions. No edema. No acute bony abnormality. IMPRESSION: Cardiomegaly.  No acute cardiopulmonary disease. Electronically Signed   By: Rolm Baptise M.D.   On: 03/07/2019 21:04   ECHOCARDIOGRAM COMPLETE  Result Date: 03/09/2019   ECHOCARDIOGRAM REPORT   Patient Name:   JAVEN RIDINGS Date of Exam: 03/09/2019 Medical Rec #:  [id number]       Height:       66.0 in Accession #:    [id number]      Weight:       198.4 lb Date of Birth:  March 31, 1960        BSA:          1.99 m Patient Age:    64 years        BP:           99/67 mmHg Patient Gender: F               HR:           60 bpm. Exam Location:  Inpatient Procedure: 2D Echo and Intracardiac Opacification Agent Indications:    CHF-Acute  Systolic 195.09 / T26.71  History:        Patient has prior history of Echocardiogram examinations, most                 recent 04/10/2017. CHF, Biventricular ICD, Stroke;                 Arrythmias:Atrial Fibrillation. Chronic kidney disease.                 Congenital heart block. GERD.  Sonographer:    [name] RDCS Referring Phys: [place]  1. Left ventricular ejection fraction, by visual estimation, is <20%. The left ventricle has severely decreased function. There is no left ventricular hypertrophy.  2. Definity contrast agent was given IV to delineate the left ventricular endocardial borders.  3. Abnormal septal motion consistent with left bundle branch block.  4. Left ventricular diastolic parameters are consistent with Grade III diastolic dysfunction (restrictive).  5. Moderately dilated left ventricular internal cavity size.  6. The left ventricle demonstrates global hypokinesis.  7. Global right ventricle has mildly reduced  systolic function.The right ventricular size is severely enlarged. No increase in right ventricular wall thickness.  8. Left atrial size was severely dilated.  9. Right atrial size was severely dilated. 10. Trivial pericardial effusion is present. 11. The mitral valve is normal in structure. No evidence of mitral valve regurgitation. 12. The tricuspid valve is normal in structure. 13. The aortic valve is normal in structure. Aortic valve regurgitation is not visualized. Mild aortic valve sclerosis without stenosis. 14. Pulmonic regurgitation is moderate. 15. The pulmonic valve was normal in structure. Pulmonic valve regurgitation is moderate. 16. Mildly dilated pulmonary artery. 17. Mildly elevated pulmonary artery systolic pressure. 18. The tricuspid regurgitant velocity is 2.32 m/s, and with an assumed right atrial pressure of 15 mmHg, the estimated right ventricular systolic pressure is mildly elevated at 36.6 mmHg. 19. A pacer wire is visualized.  20. The inferior vena cava is dilated in size with <50% respiratory variability, suggesting right atrial pressure of 15 mmHg. 21. No intracardiac thrombi or masses were visualized. 22. No significant change from prior study (April 10, 2017). 23. Prior images reviewed side by side. FINDINGS  Left Ventricle: Left ventricular ejection fraction, by visual estimation, is <20%. The left ventricle has severely decreased function. Definity contrast agent was given IV to delineate the left ventricular endocardial borders. The left ventricle demonstrates global hypokinesis. The left ventricular internal cavity size was moderately dilated left ventricle. There is no left ventricular hypertrophy. Abnormal (paradoxical) septal motion, consistent with left bundle branch block. The left ventricular diastology could not be evaluated due to atrial fibrillation. Left ventricular diastolic parameters are consistent with Grade III diastolic dysfunction (restrictive). Right Ventricle: The right ventricular size is severely enlarged. No increase in right ventricular wall thickness. Global RV systolic function is has mildly reduced systolic function. The tricuspid regurgitant velocity is 2.32 m/s, and with an assumed right atrial pressure of 15 mmHg, the estimated right ventricular systolic pressure is mildly elevated at 36.6 mmHg. Left Atrium: Left atrial size was severely dilated. Right Atrium: Right atrial size was severely dilated Pericardium: Trivial pericardial effusion is present. Mitral Valve: The mitral valve is normal in structure. No evidence of mitral valve regurgitation, with centrally-directed jet. Tricuspid Valve: The tricuspid valve is normal in structure. Tricuspid valve regurgitation is severe. Aortic Valve: The aortic valve is normal in structure. Aortic valve regurgitation is not visualized. Mild aortic valve sclerosis is present, with no evidence of aortic valve stenosis. Pulmonic Valve: The pulmonic valve was  normal in structure. Pulmonic valve regurgitation is moderate. Pulmonic regurgitation is moderate. Aorta: The aortic root and ascending aorta are structurally normal, with no evidence of dilitation. Pulmonary Artery: The pulmonary artery is mildly dilated. Venous: The inferior vena cava is dilated in size with less than 50% respiratory variability, suggesting right atrial pressure of 15 mmHg. IAS/Shunts: No atrial level shunt detected by color flow Doppler. Additional Comments: No intracardiac thrombi or masses were visualized. A pacer wire is visualized.  LEFT VENTRICLE PLAX 2D LVIDd:         6.10 cm       Diastology LVIDs:         5.20 cm       LV e' lateral:   5.76 cm/s LV PW:         1.00 cm       LV E/e' lateral: 18.0 LV IVS:        1.00 cm       LV e' medial:    4.95 cm/s  LVOT diam:     1.80 cm       LV E/e' medial:  21.0 LV SV:         57 ml LV SV Index:   27.63 LVOT Area:     2.54 cm  LV Volumes (MOD) LV area d, A2C:    40.10 cm LV area d, A4C:    46.10 cm LV area s, A2C:    36.70 cm LV area s, A4C:    40.70 cm LV major d, A2C:   8.43 cm LV major d, A4C:   8.80 cm LV major s, A2C:   8.23 cm LV major s, A4C:   8.19 cm LV vol d, MOD A2C: 159.0 ml LV vol d, MOD A4C: 201.0 ml LV vol s, MOD A2C: 139.0 ml LV vol s, MOD A4C: 162.0 ml LV SV MOD A2C:     20.0 ml LV SV MOD A4C:     201.0 ml LV SV MOD BP:      32.6 ml RIGHT VENTRICLE RV Basal diam:  5.61 cm RV S prime:     11.40 cm/s TAPSE (M-mode): 1.8 cm LEFT ATRIUM              Index       RIGHT ATRIUM           Index LA diam:        4.40 cm  2.21 cm/m  RA Area:     32.60 cm LA Vol (A2C):   168.0 ml 84.29 ml/m RA Volume:   128.00 ml 64.22 ml/m LA Vol (A4C):   161.0 ml 80.78 ml/m LA Biplane Vol: 174.0 ml 87.30 ml/m  AORTIC VALVE LVOT Vmax:   120.00 cm/s LVOT Vmean:  69.500 cm/s LVOT VTI:    0.170 m  AORTA Ao Root diam: 2.40 cm Ao Asc diam:  3.30 cm MITRAL VALVE                TRICUSPID VALVE MV Area (PHT): 5.97 cm     TR Peak grad:   21.6 mmHg MV PHT:         36.83 msec   TR Vmax:        286.00 cm/s MV Decel Time: 127 msec MV E velocity: 104.00 cm/s  SHUNTS                             Systemic VTI:  0.17 m                             Systemic Diam: 1.80 cm  Dani Gobble Croitoru MD Electronically signed by Sanda Klein MD Signature Date/Time: 03/09/2019/3:16:38 PM    Final    VAS [place] LOWER EXTREMITY VENOUS (DVT)  Result Date: 03/20/2019  Lower Venous Study Indications: Pain, and Edema. Other Indications: Acute respiratory failure and acute pulmonary edema. Limitations: Body habitus and Patient's inability to tolerate full compression of the vein. Comparison Study: Negative right lower ext. venous study on 03/13/2019. Performing Technologist: Oda Cogan RDMS, RVT  Examination Guidelines: A complete evaluation includes B-mode imaging, spectral Doppler, color Doppler, and power Doppler as needed of all accessible portions of each vessel. Bilateral testing is considered an integral part of a complete examination. Limited examinations for reoccurring indications may be performed as noted.  +---------+---------------+---------+-----------+----------+-------------------+  RIGHT     Compressibility Phasicity Spontaneity Properties Thrombus Aging       +---------+---------------+---------+-----------+----------+-------------------+  CFV       Full            Yes       Yes                                         +---------+---------------+---------+-----------+----------+-------------------+  SFJ       Full                                                                  +---------+---------------+---------+-----------+----------+-------------------+  FV Prox   Full                                                                  +---------+---------------+---------+-----------+----------+-------------------+  FV Mid                    Yes       Yes                    patient unable to                                                                tolerate                                                                          compression          +---------+---------------+---------+-----------+----------+-------------------+  FV Distal                 Yes       Yes                    patient unable to                                                                tolerate  compression          +---------+---------------+---------+-----------+----------+-------------------+  POP       Full            Yes       Yes                                         +---------+---------------+---------+-----------+----------+-------------------+  PTV       Full                                                                  +---------+---------------+---------+-----------+----------+-------------------+  PERO      Full                                                                  +---------+---------------+---------+-----------+----------+-------------------+ Patent femoral vein by color Doppler imaging.  +---------+---------------+---------+-----------+----------+-------------------+  LEFT      Compressibility Phasicity Spontaneity Properties Thrombus Aging       +---------+---------------+---------+-----------+----------+-------------------+  CFV       Full                                                                  +---------+---------------+---------+-----------+----------+-------------------+  SFJ       Full                                                                  +---------+---------------+---------+-----------+----------+-------------------+  FV Prox   Full                                                                  +---------+---------------+---------+-----------+----------+-------------------+  FV Mid                    Yes       Yes                    patient unable to                                                                tolerate  compression          +---------+---------------+---------+-----------+----------+-------------------+  FV Distal                 Yes       Yes                    patient unable to                                                                tolerate                                                                         compression          +---------+---------------+---------+-----------+----------+-------------------+  PFV       Full                                                                  +---------+---------------+---------+-----------+----------+-------------------+  POP       Full            Yes       Yes                                         +---------+---------------+---------+-----------+----------+-------------------+  PTV       Full                                                                  +---------+---------------+---------+-----------+----------+-------------------+  PERO      Full                                                                  +---------+---------------+---------+-----------+----------+-------------------+ Patent femoral vein by color Doppler imaging.   Summary: Right: There is no evidence of obvious deep vein thrombosis in the lower extremity. No cystic structure found in the popliteal fossa. Left: There is no evidence of obvious deep vein thrombosis in the lower extremity. No cystic structure found in the popliteal fossa.  *See table(s) above for measurements and observations. Electronically signed by Ruta Hinds MD on 03/20/2019 at 9:43:27 AM.    Final    VAS [place] LOWER EXTREMITY VENOUS (DVT)  Result Date: 03/13/2019  Lower Venous Study Indications: Pain.  Limitations: Pain tolerance.  Comparison Study: no prior Performing Technologist: Abram Sander RVS  Examination Guidelines: A complete evaluation includes B-mode imaging, spectral Doppler, color Doppler, and power Doppler as needed of all accessible portions of each vessel. Bilateral testing is  considered an integral part of a complete examination. Limited examinations for reoccurring indications may be performed as noted.  +---------+---------------+---------+-----------+----------+-------------------+  RIGHT     Compressibility Phasicity Spontaneity Properties Thrombus Aging       +---------+---------------+---------+-----------+----------+-------------------+  CFV       Full            Yes       Yes                                         +---------+---------------+---------+-----------+----------+-------------------+  SFJ       Full                                                                  +---------+---------------+---------+-----------+----------+-------------------+  FV Prox   Full                                                                  +---------+---------------+---------+-----------+----------+-------------------+  FV Mid                    Yes       Yes                    unable to tolerate                                                               compression          +---------+---------------+---------+-----------+----------+-------------------+  FV Distal                 Yes       Yes                    unable to tolerate                                                               compression          +---------+---------------+---------+-----------+----------+-------------------+  PFV       Full                                                                  +---------+---------------+---------+-----------+----------+-------------------+  POP       Full            Yes       Yes                                         +---------+---------------+---------+-----------+----------+-------------------+  PTV       Full                                                                  +---------+---------------+---------+-----------+----------+-------------------+  PERO      Full                                                                   +---------+---------------+---------+-----------+----------+-------------------+   +----+---------------+---------+-----------+----------+--------------+  LEFT Compressibility Phasicity Spontaneity Properties Thrombus Aging  +----+---------------+---------+-----------+----------+--------------+  CFV  Full            Yes                                              +----+---------------+---------+-----------+----------+--------------+     Summary: Right: There is no evidence of deep vein thrombosis in the lower extremity. However, portions of this examination were limited- see technologist comments above. No cystic structure found in the popliteal fossa. Left: No evidence of common femoral vein obstruction.  *See table(s) above for measurements and observations. Electronically signed by Monica Martinez MD on 03/13/2019 at 1:49:39 PM.    Final    [place] EKG SITE RITE  Result Date: 03/08/2019 If Site Rite image not attached, placement could not be confirmed due to current cardiac rhythm.   Lab Data:  CBC: Recent Labs  Lab 03/29/19 0723 04/02/19 2118 04/04/19 0613  WBC 11.7* 14.6* 13.7*  HGB 8.6* 8.8* 8.4*  HCT 25.0* 27.3* 25.3*  MCV 79.9* 83.2 83.2  PLT 237 300 992   Basic Metabolic Panel: Recent Labs  Lab 03/29/19 0723 03/29/19 1231 04/02/19 2118 04/04/19 0613  NA 119* 128* 127* 130*  K 5.5* 3.9 5.0 3.8  CL 77* 89* 86* 93*  CO2 22 22 19* 23  GLUCOSE 211* 134* 226* 74  BUN 78* 21* 62* 31*  CREATININE 6.48* 2.75* 6.18* 3.73*  CALCIUM 8.9 8.6* 8.5* 8.1*  PHOS 6.2*  --   --   --    GFR: Estimated Creatinine Clearance: 15.2 mL/min (A) (by C-G formula based on SCr of 3.73 mg/dL (H)). Liver Function Tests: Recent Labs  Lab 03/29/19 0723 04/03/19 0040  AST  --  52*  ALT  --  40  ALKPHOS  --  422*  BILITOT  --  7.0*  PROT  --  7.5  ALBUMIN 1.9* 2.1*   No results for input(s): LIPASE, AMYLASE in the last 168 hours. No results for input(s): AMMONIA in the last 168  hours. Coagulation Profile: No results for input(s):  INR, PROTIME in the last 168 hours. Cardiac Enzymes: No results for input(s): CKTOTAL, CKMB, CKMBINDEX, TROPONINI in the last 168 hours. BNP (last 3 results) No results for input(s): PROBNP in the last 8760 hours. HbA1C: No results for input(s): HGBA1C in the last 72 hours. CBG: Recent Labs  Lab 03/29/19 1548 04/03/19 0756 04/03/19 1208 04/03/19 2140 04/04/19 0555  GLUCAP 136* 199* 248* 118* 72   Lipid Profile: No results for input(s): CHOL, HDL, LDLCALC, TRIG, CHOLHDL, LDLDIRECT in the last 72 hours. Thyroid Function Tests: No results for input(s): TSH, T4TOTAL, FREET4, T3FREE, THYROIDAB in the last 72 hours. Anemia Panel: No results for input(s): VITAMINB12, FOLATE, FERRITIN, TIBC, IRON, RETICCTPCT in the last 72 hours. Urine analysis:    Component Value Date/Time   COLORURINE YELLOW 03/03/2017 1229   APPEARANCEUR CLOUDY (A) 03/03/2017 1229   LABSPEC 1.009 03/03/2017 1229   PHURINE 7.0 03/03/2017 1229   GLUCOSEU NEGATIVE 03/03/2017 1229   GLUCOSEU NEGATIVE 10/14/2014 1102   HGBUR NEGATIVE 03/03/2017 1229   BILIRUBINUR NEGATIVE 03/03/2017 1229   KETONESUR NEGATIVE 03/03/2017 1229   PROTEINUR 30 (A) 03/03/2017 1229   UROBILINOGEN 0.2 10/14/2014 1102   NITRITE NEGATIVE 03/03/2017 1229   LEUKOCYTESUR TRACE (A) 03/03/2017 1229     Benito Mccreedy M.D. [facility] 04/04/2019, 9:36 AM  Pager: [phone] Between 7am to 7pm - call Pager - [phone]  After 7pm go to [email or web address] - password [email or web address]  Call night coverage person covering after 7pm

## 2019-04-04 NOTE — Progress Notes (Signed)
Pt was asleep when this nurse attempted to give meds and assess pt. Was somewhat hard to arouse. Pt's husband at bedside and was insisting on giving pt the meds. This nurse encouraged pt to arouse enough to hold cup of water on her own to be awake enough to swallow pills safely. This appeared to agitate the pt but she was able to do the task. After meds were given this nurse attempted an assessment but pt was now agitated with nurse and would not allow me to look at back/buttocks at this time. Will attempt again.

## 2019-04-04 NOTE — Plan of Care (Signed)
  Problem: Education: Goal: Knowledge of disease and its progression will improve Outcome: Progressing Goal: Individualized Educational Video(s) Outcome: Progressing   Problem: Fluid Volume: Goal: Compliance with measures to maintain balanced fluid volume will improve Outcome: Progressing   Problem: Health Behavior/Discharge Planning: Goal: Ability to manage health-related needs will improve Outcome: Progressing   Problem: Nutritional: Goal: Ability to make healthy dietary choices will improve Outcome: Progressing   Problem: Clinical Measurements: Goal: Complications related to the disease process, condition or treatment will be avoided or minimized Outcome: Progressing   Problem: Education: Goal: Ability to demonstrate management of disease process will improve Outcome: Progressing Goal: Ability to verbalize understanding of medication therapies will improve Outcome: Progressing Goal: Individualized Educational Video(s) Outcome: Progressing   Problem: Activity: Goal: Capacity to carry out activities will improve Outcome: Progressing   Problem: Cardiac: Goal: Ability to achieve and maintain adequate cardiopulmonary perfusion will improve Outcome: Progressing   Problem: Education: Goal: Knowledge of cardiac device and self-care will improve Outcome: Progressing Goal: Ability to safely manage health related needs after discharge will improve Outcome: Progressing Goal: Individualized Educational Video(s) Outcome: Progressing   Problem: Cardiac: Goal: Ability to achieve and maintain adequate cardiopulmonary perfusion will improve Outcome: Progressing   Problem: Education: Goal: Knowledge of General Education information will improve Description: Including pain rating scale, medication(s)/side effects and non-pharmacologic comfort measures Outcome: Progressing   Problem: Health Behavior/Discharge Planning: Goal: Ability to manage health-related needs will  improve Outcome: Progressing   Problem: Clinical Measurements: Goal: Ability to maintain clinical measurements within normal limits will improve Outcome: Progressing Goal: Will remain free from infection Outcome: Progressing Goal: Diagnostic test results will improve Outcome: Progressing Goal: Respiratory complications will improve Outcome: Progressing Goal: Cardiovascular complication will be avoided Outcome: Progressing   Problem: Activity: Goal: Risk for activity intolerance will decrease Outcome: Progressing   Problem: Nutrition: Goal: Adequate nutrition will be maintained Outcome: Progressing   Problem: Coping: Goal: Level of anxiety will decrease Outcome: Progressing   Problem: Elimination: Goal: Will not experience complications related to bowel motility Outcome: Progressing Goal: Will not experience complications related to urinary retention Outcome: Progressing   Problem: Pain Managment: Goal: General experience of comfort will improve Outcome: Progressing   Problem: Safety: Goal: Ability to remain free from injury will improve Outcome: Progressing   Problem: Skin Integrity: Goal: Risk for impaired skin integrity will decrease Outcome: Progressing

## 2019-04-05 ENCOUNTER — Inpatient Hospital Stay (HOSPITAL_COMMUNITY): Payer: Medicaid Other

## 2019-04-05 DIAGNOSIS — Z515 Encounter for palliative care: Secondary | ICD-10-CM

## 2019-04-05 DIAGNOSIS — I509 Heart failure, unspecified: Secondary | ICD-10-CM

## 2019-04-05 DIAGNOSIS — Z66 Do not resuscitate: Secondary | ICD-10-CM

## 2019-04-05 HISTORY — PX: IR REMOVAL TUN CV CATH W/O FL: IMG2289

## 2019-04-05 LAB — GLUCOSE, CAPILLARY
Glucose-Capillary: 55 mg/dL — ABNORMAL LOW (ref 70–99)
Glucose-Capillary: 64 mg/dL — ABNORMAL LOW (ref 70–99)
Glucose-Capillary: 83 mg/dL (ref 70–99)
Glucose-Capillary: 145 mg/dL — ABNORMAL HIGH (ref 70–99)

## 2019-04-05 MED ORDER — GLUCOSE 40 % PO GEL
1.0000 | ORAL | Status: AC
  Administered 2019-04-05: 06:00:00 37.5 g via ORAL

## 2019-04-05 MED ORDER — POLYETHYLENE GLYCOL 3350 17 G PO PACK
17.0000 g | PACK | Freq: Every day | ORAL | Status: DC
  Administered 2019-04-05: 12:00:00 17 g via ORAL
  Filled 2019-04-05: qty 1

## 2019-04-05 MED ORDER — OXYCODONE HCL 20 MG/ML PO CONC: 5.0000 mg | ORAL | 0 refills | Status: AC | PRN

## 2019-04-05 MED ORDER — LIDOCAINE HCL 1 % IJ SOLN
INTRAMUSCULAR | Status: AC
  Filled 2019-04-05: qty 20

## 2019-04-05 MED ORDER — POLYETHYLENE GLYCOL 3350 17 G PO PACK: 17.0000 g | PACK | Freq: Every day | ORAL | 0 refills | Status: AC

## 2019-04-05 MED ORDER — GLUCOSE 40 % PO GEL
ORAL | Status: AC
  Filled 2019-04-05: qty 1

## 2019-04-05 MED ORDER — CHLORHEXIDINE GLUCONATE 4 % EX LIQD
CUTANEOUS | Status: AC
  Filled 2019-04-05: qty 15

## 2019-04-05 MED ORDER — OXYCODONE HCL 20 MG/ML PO CONC: 5.0000 mg | ORAL | Status: DC | PRN

## 2019-04-05 MED ORDER — LORAZEPAM 2 MG/ML IJ SOLN: 0.5000 mg | INTRAMUSCULAR | Status: DC | PRN

## 2019-04-05 NOTE — Progress Notes (Signed)
Repeat CBG 83 after oral glucose gel given.

## 2019-04-05 NOTE — Consult Note (Signed)
Consultation Note Date: 04/05/2019   Patient Name: [name]  DOB: 24-Sep-1960  MRN: [id number]  Age / Sex: 59 y.o., female  PCP: [name], MD Referring Physician: Shelly Coss, MD  Reason for Consultation: Establishing goals of care, Inpatient hospice referral and Psychosocial/spiritual support  HPI/Patient Profile: 59 y.o. 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    Patient and her family face the difficult decisions in realizing limitations of medical interventions to prolong quality of life when as  body begins to fail to thrive.  Patient has made decision to stop dialysis and her husband supports her decision.  She wishes to discharge home with hospice asap.  Clinical Assessment and Goals of Care:   This NP Wadie Lessen reviewed medical records, received report from team, assessed the patient and then meet at the patient's bedside along with her husband  to discuss diagnosis, prognosis, GOC, EOL wishes disposition and options.  Concept of Hospice and Palliative Care were reviewed.  I worked with this family on the last admission  A detailed discussion was had today regarding advanced directives.  Concepts specific to code status, artifical feeding and hydration, continued IV antibiotics and rehospitalization was had.  The difference between a aggressive medical intervention path   and a palliative comfort care path for this patient at this time was had.  Values and goals of care important to patient and family were attempted to be elicited.  Created space and opportunity for patient and her husband to explore their thoughts and feelings regarding the current medical situation.  Their strong faith and loving family is foundational.  Discussed hospice benefit in the home.  Natural trajectory and expectations at EOL were discussed.  Questions and concerns addressed.   Family encouraged to call with questions or concerns.    PMT will continue to support holistically.   NEXT OF KIN    SUMMARY OF RECOMMENDATIONS    Code Status/Advance Care Planning:  DNR   Deactivate ICD-order written  Patient will need transport home    Symptom Management:   Pain/Dyspnea: Oxycodone solution 5 mg p.o./sublingual every 2 hours as needed  Palliative Prophylaxis:   Aspiration, Bowel Regimen, Delirium Protocol, Frequent Pain Assessment and Oral Care  Additional Recommendations (Limitations, Scope, Preferences):  Full Comfort Care  Psycho-social/Spiritual:   Desire for further Chaplaincy support:no-patient has been is a Clinical biochemist at [employer]  Additional Recommendations: Education on Hospice and Grief/Bereavement Support  Prognosis:   < 2 weeks  Discharge Planning: Home with Hospice      Primary Diagnoses: Present on Admission: .  Acute respiratory failure with hypoxia ([place]) . Cardiorenal syndrome/chronic kidney disease . ATRIAL FIBRILLATION, CHRONIC . Acute on chronic systolic CHF (congestive heart failure), NYHA class 4 ([place]) . Type 2 diabetes mellitus with stage 5 chronic kidney disease ([place]) . ESRD (end stage renal disease) ([place])   I have reviewed the medical record, interviewed the patient and family, and examined the patient. The following aspects are pertinent.  Past Medical History:  Diagnosis Date  . Automatic implantable  cardioverter-defibrillator in situ    a. 03/2013: BSX Energen CRTD BiV ICC, ser# [id number]  . Chronic anticoagulation    a.  Previously on warfarin.  Changed to Eliquis in January 2021.  Marland Kitchen Chronic atrial fibrillation (HCC)    a. CHA2DS2VASc = 5-->Eliquis.  . Chronic kidney disease    creatinine- 1.8 in 09/2006 and 2.0 in 05/2007; 2.05 in 2011, 2.29 in 2012  . Chronic systolic CHF (congestive heart failure) ([place])    a. 03/2013 Echo: Sev LV dysfxn with sev diff HK, restrictive phys, diast dysfxn, mild MR, sev dil LA/RA, mod PR, PASP 61mmHg; b. 12/2019 Echo: EF <20%, glob HK. Sev enlarged RV w/ mildly reduced fxn. Sev BAE. Mild AoV sclerosis. Mod PR; c. 02/2019 RHC (on dobutamine 2.72mcg): RA 18, RV 43/18, PA 45/19(28), PCWP 19, CO/CI 6.1/3.06. PVR 1.5 WU. PAPI 1.4.  . Congenital third degree heart block    a. Guidant VVI pacemaker implanted in 09/1997; b. gen change in 01/2004;  c. 03/2013 upgrade to [place], ser# [id number].  . Dizziness and giddiness 05/19/2012  . Dysphagia 08/14/2011   FEB 2013 EGD/DIL 16 MM; GERD; h/o gastroesophageal reflux disease; + H. Pylori gastritis   . ESRD (end stage renal disease) ([place])    a. HD since 02/2019.  Marland Kitchen GERD (gastroesophageal reflux disease)   . Helicobacter pylori gastritis 08/14/2011  . IDDM (insulin dependent diabetes mellitus)   . Kidney stones 1990's  . Monoclonal gammopathy   . Nonischemic cardiomyopathy ([place])    a. 03/2013 Echo: Sev LV dysfxn with sev diff HK, restrictive phys, diast dysfxn, mild MR, sev dil LA/RA, mod PR, PASP 26mmHg;  b. 03/2013: BSX Energen CRTD BiV ICC, ser# [id number]; c. 02/2019 Echo: EF <20%.  . Potassium (K) excess   . Stroke [facility]) 1999   denies residual on 04/21/2013  . Subcutaneous nodules    a. 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   Social History   Socioeconomic History  . Marital status: Married    Spouse name: Not on file  . Number of children: 4  . Years of education: Not on file  .  Highest education level: Not on file  Occupational History    Employer: UNEMPLOYED  Tobacco Use  . Smoking status: Never Smoker  . Smokeless tobacco: Never Used  . Tobacco comment: Never smoked  Substance and Sexual Activity  . Alcohol use: No    Alcohol/week: 0.0 standard drinks  . Drug use: No  . Sexual activity: Not Currently    Birth control/protection: Post-menopausal  Other Topics Concern  . Not on file  Social History Narrative  . Not on file   Social Determinants of Health   Financial Resource Strain:   . Difficulty of Paying Living Expenses: Not on file  Food Insecurity:   . Worried About Charity fundraiser in the Last Year: Not on file  . Ran Out of Food in the Last Year: Not on file  Transportation Needs:   . Lack of Transportation (  Medical): Not on file  . Lack of Transportation (Non-Medical): Not on file  Physical Activity:   . Days of Exercise per Week: Not on file  . Minutes of Exercise per Session: Not on file  Stress:   . Feeling of Stress : Not on file  Social Connections:   . Frequency of Communication with Friends and Family: Not on file  . Frequency of Social Gatherings with Friends and Family: Not on file  . Attends Religious Services: Not on file  . Active Member of Clubs or Organizations: Not on file  . Attends Archivist Meetings: Not on file  . Marital Status: Not on file   Family History  Adopted: Yes  Problem Relation Age of Onset  . Autism Son   . Seizures Son   . Diabetes Daughter   . Colon cancer Neg Hx   . Colon polyps Neg Hx    Scheduled Meds: . allopurinol  150 mg Oral Daily  . apixaban  5 mg Oral BID  . Chlorhexidine Gluconate Cloth  6 each Topical Q0600  . dextrose      . gabapentin  300 mg Oral Daily  . insulin aspart  0-9 Units Subcutaneous TID WC  . insulin glargine  25 Units Subcutaneous QHS  . midodrine  15 mg Oral TID WC  . multivitamin  1 tablet Oral QHS  . polyethylene glycol  17 g Oral Daily    Continuous Infusions: . albumin human 12.5 g (04/03/19 1535)   PRN Meds:.acetaminophen **OR** acetaminophen, albumin human, ondansetron **OR** ondansetron (ZOFRAN) IV, senna-docusate, traMADol Medications Prior to Admission:  Prior to Admission medications   Medication Sig Start Date End Date Taking? Authorizing Provider  acetaminophen (TYLENOL) 500 MG tablet Take 500 mg by mouth every 6 (six) hours as needed for moderate pain.   Yes [provider]  allopurinol (ZYLOPRIM) 300 MG tablet Take 0.5 tablets (150 mg total) by mouth every morning. Patient taking differently: Take 150 mg by mouth daily.  03/29/19  Yes Clegg, Amy D, NP  apixaban (ELIQUIS) 5 MG TABS tablet Take 1 tablet (5 mg total) by mouth 2 (two) times daily. 03/29/19  Yes Clegg, Amy D, NP  diclofenac Sodium (VOLTAREN) 1 % GEL Apply 2 g topically 2 (two) times daily.  02/23/19  Yes [provider]  gabapentin (NEURONTIN) 300 MG capsule Take 1 capsule (300 mg total) by mouth daily. 03/29/19  Yes Clegg, Amy D, NP  insulin NPH Human (HUMULIN N) 100 UNIT/ML injection Inject 0.34 mLs (34 Units total) into the skin daily. Patient taking differently: Inject 22 Units into the skin daily.  07/22/18  Yes Elayne Snare, MD  midodrine (PROAMATINE) 5 MG tablet Take 3 tablets (15 mg total) by mouth 3 (three) times daily with meals. 03/29/19  Yes Clegg, Amy D, NP  multivitamin ([place]-VIT) TABS tablet Take 1 tablet by mouth at bedtime. 03/29/19  Yes Clegg, Amy D, NP  NOVOLOG FLEXPEN 100 UNIT/ML FlexPen INJECT SUBCUTANEOUSLY 12 UNITS BEFORE BREAKFAST; 14 UNITS AT LUNCH; AND 16 UNITS AT DINNER. Patient taking differently: Inject 12-16 Units into the skin See admin instructions. Use 12 units before breakfast, use 14 units at lunch and use 16 units at dinner 03/30/19  Yes Elayne Snare, MD  traMADol (ULTRAM) 50 MG tablet Take 1 tablet (50 mg total) by mouth every 12 (twelve) hours as needed for moderate pain. 03/29/19  Yes Clegg, Amy D, NP  TRESIBA  FLEXTOUCH 200 UNIT/ML SOPN INJECT 56 UNITS INTO THE  SKIN DAILY BEFORE BREAKFAST. Patient taking differently: Inject 56 Units into the skin daily.  03/30/19  Yes Elayne Snare, MD  VICTOZA 18 MG/3ML SOPN INJECT 1.2MG  SUBCUTANEOUSLY DAILY AS DIRECTED. Patient taking differently: Inject 1.2 mg into the skin daily.  03/30/19  Yes Elayne Snare, MD  Insulin Pen Needle 31G X 5 MM MISC 1 each by Does not apply route daily. Use as instructed to check blood sugar 4 times daily. 02/11/18   Elayne Snare, MD  Insulin Syringe-Needle U-100 30G 1 ML MISC 1 each by Does not apply route daily. Use needle to inject insulin 3 times daily. 02/13/18   Elayne Snare, MD  [facility] PEN NEEDLES 31G X 8 MM MISC USE AS DIRECTED UP TO 4 TIMES DAILY. 03/30/19   Elayne Snare, MD  SURE COMFORT INSULIN SYRINGE 31G X 5/16" 0.5 ML MISC INJECT ONCE DAILY AS DIRECTED. 03/30/19   Elayne Snare, MD   Allergies  Allergen Reactions  . Wheat Swelling  . Latex Rash  . Penicillins Rash  . Sulfa Antibiotics Rash   Review of Systems  Constitutional: Positive for fatigue.  Neurological: Positive for weakness.    Physical Exam Constitutional:      Appearance: She is underweight. She is ill-appearing.  Cardiovascular:     Rate and Rhythm: Normal rate.  Pulmonary:     Breath sounds: Normal breath sounds.  Musculoskeletal:     Right lower leg: 2+ Pitting Edema present.     Left lower leg: 2+ Pitting Edema present.  Skin:    General: Skin is warm and dry.  Neurological:     Mental Status: She is lethargic.     Vital Signs: BP (!) 104/54 (BP Location: Left Arm)   Pulse 60   Temp 97.8 F (36.6 C) (Oral)   Resp 19   Ht 5\' 6"  (1.676 m)   Wt 72 kg   SpO2 94%   BMI 25.62 kg/m  Pain Scale: 0-10   Pain Score: 5    SpO2: SpO2: 94 % O2 Device:SpO2: 94 % O2 Flow Rate: .   IO: Intake/output summary:   Intake/Output Summary (Last 24 hours) at 04/05/2019 1037 Last data filed at 04/05/2019 0900 Gross per 24 hour  Intake 720 ml  Output --   Net 720 ml    LBM: Last BM Date: 03/31/19 Baseline Weight: Weight: 68.8 kg Most recent weight: Weight: 72 kg     Palliative Assessment/Data:  30 % at best   Discussed with Dr Tawanna Solo via secure chat and bedside RN   Time In: 0930 Time Out: 1045 Time Total: 75 minutes Greater than 50%  of this time was spent counseling and coordinating care related to the above assessment and plan.  Signed by: Wadie Lessen, NP   Please contact Palliative Medicine Team phone at [phone] for questions and concerns.  For individual provider: See Shea Evans

## 2019-04-05 NOTE — Discharge Summary (Signed)
Physician Discharge Summary  [name] YTK:[id number] DOB: 1961-02-25 DOA: 04/02/2019  PCP: [name], MD  Admit date: 04/02/2019 Discharge date: 04/05/2019  Admitted From: Home Disposition:  Home  Discharge Condition:Stable CODE STATUS:DNR Diet recommendation: Heart Healthy   Brief/Interim Summary: Patient is a 59 year old female with history of nonischemic cardiomyopathy with systolic dysfunction, ejection fraction of 15 to 20%, status post CRT-D, end-stage renal disease on hemodialysis, insulin-dependent large mellitus, chronic atrial fibrillation who presented with progressive worsening generalized weakness, anasarca.  She was admitted for the management of acute on chronic systolic CHF.  Cardiology and nephrology were following.  Cardiology have signed off recommending residential hospice.hemodialysis stopped. Patient and her husband interested on home with hospice.  Following problems were addresses during her hospitalization:  Acute on chronic systolic heart failure/nonischemic cardiomyopathy: Advanced.  Ejection fraction of 15 to 20%.  Very poor prognosis.  Cardiology was initially following but have signed off recommending residential hospice and no further recommendation given her advanced disease.She has CRT-D  ESRD: On hemodialysis TTS.   She is volume overloaded.  Next plan for hemodialysis was today.  Patient is not interested to continue dialysis.  Anasarca: She has significant bilateral lower extremity edema.  Chest x-ray did not show any pulmonary edema.  Undergoing hemodialysis.  On midodrine 3 times daily.  Anemia of chronic disease: Hemoglobin in the range of 8.  Not on ESA.  Secondary hyperparathyroidism: Calcium is normal.  Not on binders  Hypotension: Chronic.  On midodrine.  Was given albumin during hemodialysis  Severe protein calorie moderation: Continue supportive care  History of multiple myeloma: New diagnosis on last admission.  Scheduled for  follow-up as outpatient on 2/10.  Suspected Warfarin skin necrosis: Punch biopsy was negative for calciphylaxis  Chronic A. fib: On Eliquis.  Currently rate is controlled  Diabetes type 2: Continue sliding scale only at home.  Goals of care: Cardiology have recommended palliative care consultation and hospice approach, no further recommendation from cardiology for treatment of her nonischemic cardiomyopathy.      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)   ATRIAL FIBRILLATION, CHRONIC   Cardiorenal syndrome/chronic kidney disease   Congenital third degree heart block   Acute on chronic systolic CHF (congestive heart failure), NYHA class 4 (HCC)   Acute respiratory failure with hypoxia (HCC)   ESRD (end stage renal disease) (HCC)   CHF (congestive heart failure) ([place])    Discharge Instructions  Discharge Instructions    Diet - low sodium heart healthy   Complete by: As directed    Discharge instructions   Complete by: As directed    1)Please follow up with Home Hospice   Increase activity slowly   Complete by: As directed      Allergies as of 04/05/2019      Reactions   Wheat Swelling   Latex Rash   Penicillins Rash   Sulfa Antibiotics Rash      Medication List    STOP taking these medications   insulin NPH Human 100 UNIT/ML injection Commonly known as: HumuLIN N   Tresiba FlexTouch 200 UNIT/ML Sopn Generic drug: Insulin Degludec     TAKE these medications   acetaminophen 500 MG tablet Commonly known as: TYLENOL Take 500 mg by mouth every 6 (six) hours as needed for moderate pain.   allopurinol 300 MG tablet Commonly known as: ZYLOPRIM Take 0.5 tablets (150 mg total) by mouth every morning. What changed:  when to take this   apixaban 5 MG Tabs tablet Commonly known as: ELIQUIS Take 1 tablet (5 mg total) by mouth 2 (two) times daily.   diclofenac Sodium 1 % Gel Commonly known as:  VOLTAREN Apply 2 g topically 2 (two) times daily.   gabapentin 300 MG capsule Commonly known as: NEURONTIN Take 1 capsule (300 mg total) by mouth daily.   Insulin Pen Needle 31G X 5 MM Misc 1 each by Does not apply route daily. Use as instructed to check blood sugar 4 times daily.   Litetouch Pen Needles 31G X 8 MM Misc Generic drug: Insulin Pen Needle USE AS DIRECTED UP TO 4 TIMES DAILY.   Insulin Syringe-Needle U-100 30G 1 ML Misc 1 each by Does not apply route daily. Use needle to inject insulin 3 times daily.   Sure Comfort Insulin Syringe 31G X 5/16" 0.5 ML Misc Generic drug: Insulin Syringe-Needle U-100 INJECT ONCE DAILY AS DIRECTED.   midodrine 5 MG tablet Commonly known as: PROAMATINE Take 3 tablets (15 mg total) by mouth 3 (three) times daily with meals.   multivitamin Tabs tablet Take 1 tablet by mouth at bedtime.   NovoLOG FlexPen 100 UNIT/ML FlexPen Generic drug: insulin aspart INJECT SUBCUTANEOUSLY 12 UNITS BEFORE BREAKFAST; 14 UNITS AT LUNCH; AND 16 UNITS AT DINNER. What changed: See the new instructions.   oxyCODONE 20 MG/ML concentrated solution Commonly known as: ROXICODONE INTENSOL Take 0.3 mLs (6 mg total) by mouth every hour as needed for moderate pain (dyspnea).   polyethylene glycol 17 g packet Commonly known as: MIRALAX / GLYCOLAX Take 17 g by mouth daily. Start taking on: April 06, 2019   traMADol 50 MG tablet Commonly known as: ULTRAM Take 1 tablet (50 mg total) by mouth every 12 (twelve) hours as needed for moderate pain.   Victoza 18 MG/3ML Sopn Generic drug: liraglutide INJECT 1.2MG SUBCUTANEOUSLY DAILY AS DIRECTED. What changed: See the new instructions.      [place], [facility] Follow up.   Why: Home Hospice Contact information: [address] [place] [address] [phone]          Allergies  Allergen Reactions  . Wheat Swelling  . Latex Rash  . Penicillins Rash  . Sulfa  Antibiotics Rash    Consultations:  Cardiology,nephrology   Procedures/Studies: CT ABDOMEN PELVIS WO CONTRAST  Result Date: 03/08/2019 CLINICAL DATA:  Abdominal distension EXAM: CT ABDOMEN AND PELVIS WITHOUT CONTRAST TECHNIQUE: Multidetector CT imaging of the abdomen and pelvis was performed following the standard protocol without IV contrast. COMPARISON:  None. FINDINGS: LOWER CHEST: Cardiomegaly bibasilar atelectasis. HEPATOBILIARY: Normal hepatic contours. No intra- or extrahepatic biliary dilatation. There is cholelithiasis without acute inflammation. PANCREAS: Normal pancreas. No ductal dilatation or peripancreatic fluid collection. SPLEEN: Normal. ADRENALS/URINARY TRACT: The adrenal glands are normal. No hydronephrosis, nephroureterolithiasis or solid renal mass. The urinary bladder is normal for degree of distention STOMACH/BOWEL: There is no hiatal hernia. Normal duodenal course and caliber. No small bowel dilatation or inflammation. No focal colonic abnormality. Not visualized. No right lower quadrant inflammation or free fluid. VASCULAR/LYMPHATIC: There is calcific atherosclerosis of the abdominal aorta. No abdominal or pelvic lymphadenopathy. REPRODUCTIVE: Normal uterus and ovaries. MUSCULOSKELETAL. No bony spinal canal stenosis or focal osseous abnormality. OTHER: Anasarca. IMPRESSION: 1. No acute abnormality of the abdomen or pelvis. 2. Cardiomegaly and calcific aortic atherosclerosis (ICD10-I70.0). 3. Diffuse anasarca. Electronically Signed   By: Ulyses Jarred M.D.   On: 03/08/2019 02:02  DG Chest 2 View  Result Date: 04/02/2019 CLINICAL DATA:  59 year female with shortness of breath and fluid overload. EXAM: CHEST - 2 VIEW COMPARISON:  Chest radiograph dated 03/18/2019. FINDINGS: Right-sided dialysis catheter in similar position. There is cardiomegaly. No vascular congestion or edema. No focal consolidation, pleural effusion, or pneumothorax. Atherosclerotic calcification of the  aorta. Left pectoral AICD device. No acute osseous pathology. IMPRESSION: 1. No acute cardiopulmonary process. 2. Cardiomegaly. 3.  Aortic Atherosclerosis (ICD10-I70.0). Electronically Signed   By: Anner Crete M.D.   On: 04/02/2019 21:52   CT HEAD WO CONTRAST  Result Date: 03/24/2019 CLINICAL DATA:  Fall on heparin. EXAM: CT HEAD WITHOUT CONTRAST TECHNIQUE: Contiguous axial images were obtained from the base of the skull through the vertex without intravenous contrast. COMPARISON:  03/13/2012 FINDINGS: Brain: No evidence of acute infarction, hemorrhage, hydrocephalus, extra-axial collection or mass lesion/mass effect. Vascular: No hyperdense vessel or unexpected calcification. Skull: Normal. Negative for fracture or focal lesion. Sinuses/Orbits: No acute finding. IMPRESSION: Negative head CT. Electronically Signed   By: Monte Fantasia M.D.   On: 03/24/2019 04:58   CT CHEST WO CONTRAST  Result Date: 03/19/2019 CLINICAL DATA:  Hypoxemia and dyspnea. EXAM: CT CHEST WITHOUT CONTRAST TECHNIQUE: Multidetector CT imaging of the chest was performed following the standard protocol without IV contrast. COMPARISON:  Chest radiograph from one day prior. FINDINGS: Cardiovascular: Moderate cardiomegaly. No significant pericardial effusion/thickening. Left anterior descending coronary atherosclerosis. 3 lead left subclavian ICD is noted with lead tips in coronary sinus and right ventricle. Right internal jugular central venous catheter terminates at the cavoatrial junction. Atherosclerotic thoracic aorta with ectatic 4.4 cm ascending thoracic aorta. Dilated main pulmonary artery (3.8 cm diameter). Mediastinum/Nodes: No discrete thyroid nodules. Unremarkable esophagus. No axillary adenopathy. Enlarged 1.4 cm left prevascular node (series 3/image 43). No additional pathologically enlarged mediastinal nodes. No discrete hilar adenopathy on these noncontrast images. Lungs/Pleura: No pneumothorax. No pleural effusion.  Mild platelike scarring versus atelectasis in posterior left lower lobe. No acute consolidative airspace disease, lung masses or significant pulmonary nodules. Upper abdomen: Finely irregular liver surface, cannot exclude cirrhosis. Simple exophytic 1.5 cm upper left renal cyst. Musculoskeletal: No aggressive appearing focal osseous lesions. Moderate thoracic spondylosis. IMPRESSION: 1. Moderate cardiomegaly.  One vessel coronary atherosclerosis. 2. Dilated main pulmonary artery, suggesting pulmonary arterial hypertension. 3. Mild platelike scarring versus atelectasis at the left lung base. Otherwise no active pulmonary disease. No pleural effusions. 4. Nonspecific mild left prevascular mediastinal lymphadenopathy. 5. Ectatic 4.4 cm ascending thoracic aorta. Recommend annual imaging followup by CTA or MRA. This recommendation follows 2010 ACCF/AHA/AATS/ACR/ASA/SCA/SCAI/SIR/STS/SVM Guidelines for the Diagnosis and Management of Patients with Thoracic Aortic Disease. Circulation. 2010; 121: Z610-R604. Aortic aneurysm NOS (ICD10-I71.9). 6. Finely irregular liver surface, cannot exclude cirrhosis. Aortic Atherosclerosis (ICD10-I70.0). Electronically Signed   By: Ilona Sorrel M.D.   On: 03/19/2019 09:05   CARDIAC CATHETERIZATION  Result Date: 03/09/2019 1. Right > left heart failure with low PAPI. 2. Pulmonary venous hypertension. 3. Adequate cardiac output on dobutamine 2.5 mcg/kg/min  CT FEMUR RIGHT WO CONTRAST  Result Date: 03/08/2019 CLINICAL DATA:  Upper leg trauma, right thigh swelling EXAM: CT OF THE LOWER RIGHT EXTREMITY WITHOUT CONTRAST TECHNIQUE: Multidetector CT imaging of the right lower extremity was performed according to the standard protocol. COMPARISON:  None. FINDINGS: Bones/Joint/Cartilage No fracture or dislocation. There is moderate right hip osteoarthritis with superior joint space loss and marginal osteophyte formation. No periosteal reaction or cortical destruction. No large knee or hip  joint effusion is  seen. Ligaments Suboptimally assessed by CT. Muscles and Tendons The enthesopathy seen at the hamstrings insertion site. The remainder of the visualized portion of the tendons appear to be intact. The muscles surrounding the thigh appear to be intact. Soft tissues There is diffuse skin thickening and subcutaneous edema seen surrounding the right lower extremity. Overlying the greater trochanter adjacent to the deep fascial layer there is thin linear area of fluid. The fluid does not appear to be encapsulated however. Dense vascular calcifications seen within the deep pelvis. IMPRESSION: 1. No acute osseous abnormality or soft tissue hematoma. 2. Thin non loculated fluid seen adjacent to the deep fascial layers/iliotibial tibial tract overlying the greater trochanter which could represent a posttraumatic seroma/Mild Sherry Ruffing lesion. 3. Diffuse subcutaneous edema and skin thickening surrounding the right lower extremity. Electronically Signed   By: Prudencio Pair M.D.   On: 03/08/2019 02:09   IR Fluoro Guide CV Line Right  Result Date: 03/28/2019 INDICATION: End-stage renal disease. Please convert temporary dialysis catheter to a tunneled/permanent dialysis catheter for the continuation of dialysis. EXAM: FLUOROSCOPIC GUIDED CONVERSION OF NON TUNNELED TO TUNNELED CENTRAL VENOUS HEMODIALYSIS CATHETER COMPARISON:  Image guided temporary dialysis catheter placement-03/12/2019 MEDICATIONS: None ANESTHESIA/SEDATION: Moderate (conscious) sedation was employed during this procedure. A total of Versed 0.5 mg and Fentanyl 25 mcg was administered intravenously. Moderate Sedation Time: 11 minutes. The patient's level of consciousness and vital signs were monitored continuously by radiology nursing throughout the procedure under my direct supervision. FLUOROSCOPY TIME:  24 seconds (2 mGy) COMPLICATIONS: None immediate. PROCEDURE: Informed written consent was obtained from the patient after a discussion  of the risks, benefits, and alternatives to treatment. Questions regarding the procedure were encouraged and answered. The skin and external portion of the existing hemodialysis catheter was prepped with chlorhexidine in a sterile fashion, and a sterile drape was applied covering the operative field. Maximum barrier sterile technique with sterile gowns and gloves were used for the procedure. A timeout was performed prior to the initiation of the procedure. A lumen of the none tunneled temporary dialysis catheter was cannulated with a stiff Glidewire in utilized for measurement purposes. Next, the Glidewire was advanced to the level of the IVC. A 19 cm tip to cuff HemoSplit dialysis catheter was tunneled from a site along the anterior chest to the venotomy site. Under intermittent fluoroscopic guidance, the temporary dialysis catheter was exchanged for a peel-away sheath. The tunneled dialysis catheter was inserted into the peel-away sheath with tips open terminating within in the superior aspect the right atrium. Final catheter positioning was confirmed and documented with a spot fluoroscopic image. The catheter aspirates and flushes normally. The catheter was flushed with appropriate volume heparin dwells. The catheter exit site was secured with a 0-Prolene retention sutures. The venotomy site was apposed with derma bond Steri-Strips. Both lumens were heparinized. A dressing was placed. The patient tolerated the procedure well without immediate post procedural complication. IMPRESSION: Successful fluoroscopic guided conversion of a temporary to a permanent 19 cm tip to cuff tunneled hemodialysis catheter with tips terminating within the right atrium. The catheter is ready for immediate use. Electronically Signed   By: Sandi Mariscal M.D.   On: 03/28/2019 10:55   IR Fluoro Guide CV Line Right  Result Date: 03/12/2019 INDICATION: 59 year old female with acute kidney injury in need of hemodialysis. She presents for  placement of a temporary hemodialysis catheter. EXAM: IR ULTRASOUND GUIDANCE VASC ACCESS RIGHT; IR RIGHT FLUORO GUIDE CV LINE MEDICATIONS: None. ANESTHESIA/SEDATION: None. FLUOROSCOPY TIME:  Fluoroscopy Time: 0 minutes 30 seconds (2.5 mGy). COMPLICATIONS: None immediate. PROCEDURE: Informed written consent was obtained from the patient after a thorough discussion of the procedural risks, benefits and alternatives. All questions were addressed. Maximal Sterile Barrier Technique was utilized including caps, mask, sterile gowns, sterile gloves, sterile drape, hand hygiene and skin antiseptic. A timeout was performed prior to the initiation of the procedure. The right internal jugular vein was interrogated with ultrasound and found to be widely patent. An image was obtained and stored for the medical record. Local anesthesia was attained by infiltration with 1% lidocaine. A small dermatotomy was made. Under real-time sonographic guidance, the vessel was punctured with a 21 gauge micropuncture needle. Using standard technique, the initial micro needle was exchanged over a 0.018 micro wire for a transitional 4 [place] micro sheath. The micro sheath was then exchanged over a 0.035 wire for a fascial dilator. The soft tissue tract was then dilated. A 16 cm triple-lumen temporary hemodialysis catheter was then advanced over the wire and positioned with the catheter tip in the upper right atrium. Catheter flushes and aspirates easily. The catheter was flushed with heparinized saline, capped and secured to the skin with 0 Prolene suture. A sterile bandage was applied. IMPRESSION: Successful placement of a right IJ approach 16 cm non tunneled triple-lumen hemodialysis catheter. The catheter tips are in the upper right atrium and the catheter is ready for immediate use. Signed, Criselda Peaches, MD, [facility] Electronically Signed   By: Jacqulynn Cadet M.D.    On: 03/12/2019 16:20   IR US Guide Vasc Access Right  Result Date: 03/12/2019 INDICATION: 59 year old female with acute kidney injury in need of hemodialysis. She presents for placement of a temporary hemodialysis catheter. EXAM: IR ULTRASOUND GUIDANCE VASC ACCESS RIGHT; IR RIGHT FLUORO GUIDE CV LINE MEDICATIONS: None. ANESTHESIA/SEDATION: None. FLUOROSCOPY TIME:  Fluoroscopy Time: 0 minutes 30 seconds (2.5 mGy). COMPLICATIONS: None immediate. PROCEDURE: Informed written consent was obtained from the patient after a thorough discussion of the procedural risks, benefits and alternatives. All questions were addressed. Maximal Sterile Barrier Technique was utilized including caps, mask, sterile gowns, sterile gloves, sterile drape, hand hygiene and skin antiseptic. A timeout was performed prior to the initiation of the procedure. The right internal jugular vein was interrogated with ultrasound and found to be widely patent. An image was obtained and stored for the medical record. Local anesthesia was attained by infiltration with 1% lidocaine. A small dermatotomy was made. Under real-time sonographic guidance, the vessel was punctured with a 21 gauge micropuncture needle. Using standard technique, the initial micro needle was exchanged over a 0.018 micro wire for a transitional 4 [place] micro sheath. The micro sheath was then exchanged over a 0.035 wire for a fascial dilator. The soft tissue tract was then dilated. A 16 cm triple-lumen temporary hemodialysis catheter was then advanced over the wire and positioned with the catheter tip in the upper right atrium. Catheter flushes and aspirates easily. The catheter was flushed with heparinized saline, capped and secured to the skin with 0 Prolene suture. A sterile bandage was applied. IMPRESSION: Successful placement of a right IJ approach 16 cm non tunneled triple-lumen hemodialysis catheter. The catheter tips are in the upper right atrium and the catheter is ready  for immediate use. Signed, Criselda Peaches, MD, [facility] Electronically Signed   By: Jacqulynn Cadet M.D.   On: 03/12/2019 16:20   DG CHEST  PORT 1 VIEW  Result Date: 03/18/2019 CLINICAL DATA:  Shortness of breath EXAM: PORTABLE CHEST 1 VIEW COMPARISON:  03/17/2019 FINDINGS: Right-sided IJ approach central venous catheter with distal tip terminating at the level of the superior cavoatrial junction. Stable positioning of left-sided implanted cardiac device. Stable cardiomegaly. Calcified thoracic aorta. No new focal airspace consolidation, pleural effusion, or pneumothorax. IMPRESSION: Stable exam.  No active disease. Electronically Signed   By: Davina Poke D.O.   On: 03/18/2019 10:19   DG CHEST PORT 1 VIEW  Result Date: 03/17/2019 CLINICAL DATA:  CHF EXAM: PORTABLE CHEST 1 VIEW COMPARISON:  03/07/2019 FINDINGS: Cardiac enlargement with AICD. Negative for heart failure. Atherosclerotic aortic arch. Lungs are clear without infiltrate or effusion. IMPRESSION: No active disease. Electronically Signed   By: Franchot Gallo M.D.   On: 03/17/2019 10:58   DG Chest Portable 1 View  Result Date: 03/07/2019 CLINICAL DATA:  Shortness of breath, weakness EXAM: PORTABLE CHEST 1 VIEW COMPARISON:  06/25/2017 FINDINGS: Left AICD remains in place, unchanged. Cardiomegaly. No confluent opacities or effusions. No edema. No acute bony abnormality. IMPRESSION: Cardiomegaly.  No acute cardiopulmonary disease. Electronically Signed   By: Rolm Baptise M.D.   On: 03/07/2019 21:04   ECHOCARDIOGRAM COMPLETE  Result Date: 03/09/2019   ECHOCARDIOGRAM REPORT   Patient Name:   [name] Date of Exam: 03/09/2019 Medical Rec #:  [id number]       Height:       66.0 in Accession #:    [id number]      Weight:       198.4 lb Date of Birth:  04-19-1960        BSA:          1.99 m Patient Age:    59 years        BP:           99/67 mmHg Patient Gender: F                HR:           60 bpm. Exam Location:  Inpatient Procedure: 2D Echo and Intracardiac Opacification Agent Indications:    CHF-Acute Systolic 379.02 / I09.73  History:        Patient has prior history of Echocardiogram examinations, most                 recent 04/10/2017. CHF, Biventricular ICD, Stroke;                 Arrythmias:Atrial Fibrillation. Chronic kidney disease.                 Congenital heart block. GERD.  Sonographer:    [name] RDCS Referring Phys: [place]  1. Left ventricular ejection fraction, by visual estimation, is <20%. The left ventricle has severely decreased function. There is no left ventricular hypertrophy.  2. Definity contrast agent was given IV to delineate the left ventricular endocardial borders.  3. Abnormal septal motion consistent with left bundle branch block.  4. Left ventricular diastolic parameters are consistent with Grade III diastolic dysfunction (restrictive).  5. Moderately dilated left ventricular internal cavity size.  6. The left ventricle demonstrates global hypokinesis.  7. Global right ventricle has mildly reduced systolic function.The right ventricular size is severely enlarged. No increase in right ventricular wall thickness.  8. Left atrial size was severely dilated.  9. Right atrial size was severely dilated. 10. Trivial pericardial effusion is present. 11. The mitral valve  is normal in structure. No evidence of mitral valve regurgitation. 12. The tricuspid valve is normal in structure. 13. The aortic valve is normal in structure. Aortic valve regurgitation is not visualized. Mild aortic valve sclerosis without stenosis. 14. Pulmonic regurgitation is moderate. 15. The pulmonic valve was normal in structure. Pulmonic valve regurgitation is moderate. 16. Mildly dilated pulmonary artery. 17. Mildly elevated pulmonary artery systolic pressure. 18. The tricuspid regurgitant velocity is 2.32 m/s, and with an assumed right atrial  pressure of 15 mmHg, the estimated right ventricular systolic pressure is mildly elevated at 36.6 mmHg. 19. A pacer wire is visualized. 20. The inferior vena cava is dilated in size with <50% respiratory variability, suggesting right atrial pressure of 15 mmHg. 21. No intracardiac thrombi or masses were visualized. 22. No significant change from prior study (April 10, 2017). 23. Prior images reviewed side by side. FINDINGS  Left Ventricle: Left ventricular ejection fraction, by visual estimation, is <20%. The left ventricle has severely decreased function. Definity contrast agent was given IV to delineate the left ventricular endocardial borders. The left ventricle demonstrates global hypokinesis. The left ventricular internal cavity size was moderately dilated left ventricle. There is no left ventricular hypertrophy. Abnormal (paradoxical) septal motion, consistent with left bundle branch block. The left ventricular diastology could not be evaluated due to atrial fibrillation. Left ventricular diastolic parameters are consistent with Grade III diastolic dysfunction (restrictive). Right Ventricle: The right ventricular size is severely enlarged. No increase in right ventricular wall thickness. Global RV systolic function is has mildly reduced systolic function. The tricuspid regurgitant velocity is 2.32 m/s, and with an assumed right atrial pressure of 15 mmHg, the estimated right ventricular systolic pressure is mildly elevated at 36.6 mmHg. Left Atrium: Left atrial size was severely dilated. Right Atrium: Right atrial size was severely dilated Pericardium: Trivial pericardial effusion is present. Mitral Valve: The mitral valve is normal in structure. No evidence of mitral valve regurgitation, with centrally-directed jet. Tricuspid Valve: The tricuspid valve is normal in structure. Tricuspid valve regurgitation is severe. Aortic Valve: The aortic valve is normal in structure. Aortic valve regurgitation is not  visualized. Mild aortic valve sclerosis is present, with no evidence of aortic valve stenosis. Pulmonic Valve: The pulmonic valve was normal in structure. Pulmonic valve regurgitation is moderate. Pulmonic regurgitation is moderate. Aorta: The aortic root and ascending aorta are structurally normal, with no evidence of dilitation. Pulmonary Artery: The pulmonary artery is mildly dilated. Venous: The inferior vena cava is dilated in size with less than 50% respiratory variability, suggesting right atrial pressure of 15 mmHg. IAS/Shunts: No atrial level shunt detected by color flow Doppler. Additional Comments: No intracardiac thrombi or masses were visualized. A pacer wire is visualized.  LEFT VENTRICLE PLAX 2D LVIDd:         6.10 cm       Diastology LVIDs:         5.20 cm       LV e' lateral:   5.76 cm/s LV PW:         1.00 cm       LV E/e' lateral: 18.0 LV IVS:        1.00 cm       LV e' medial:    4.95 cm/s LVOT diam:     1.80 cm       LV E/e' medial:  21.0 LV SV:         57 ml LV SV Index:   27.63 LVOT Area:  2.54 cm  LV Volumes (MOD) LV area d, A2C:    40.10 cm LV area d, A4C:    46.10 cm LV area s, A2C:    36.70 cm LV area s, A4C:    40.70 cm LV major d, A2C:   8.43 cm LV major d, A4C:   8.80 cm LV major s, A2C:   8.23 cm LV major s, A4C:   8.19 cm LV vol d, MOD A2C: 159.0 ml LV vol d, MOD A4C: 201.0 ml LV vol s, MOD A2C: 139.0 ml LV vol s, MOD A4C: 162.0 ml LV SV MOD A2C:     20.0 ml LV SV MOD A4C:     201.0 ml LV SV MOD BP:      32.6 ml RIGHT VENTRICLE RV Basal diam:  5.61 cm RV S prime:     11.40 cm/s TAPSE (M-mode): 1.8 cm LEFT ATRIUM              Index       RIGHT ATRIUM           Index LA diam:        4.40 cm  2.21 cm/m  RA Area:     32.60 cm LA Vol (A2C):   168.0 ml 84.29 ml/m RA Volume:   128.00 ml 64.22 ml/m LA Vol (A4C):   161.0 ml 80.78 ml/m LA Biplane Vol: 174.0 ml 87.30 ml/m  AORTIC VALVE LVOT Vmax:   120.00 cm/s LVOT Vmean:  69.500 cm/s LVOT VTI:    0.170 m  AORTA Ao Root diam: 2.40  cm Ao Asc diam:  3.30 cm MITRAL VALVE                TRICUSPID VALVE MV Area (PHT): 5.97 cm     TR Peak grad:   21.6 mmHg MV PHT:        36.83 msec   TR Vmax:        286.00 cm/s MV Decel Time: 127 msec MV E velocity: 104.00 cm/s  SHUNTS                             Systemic VTI:  0.17 m                             Systemic Diam: 1.80 cm  Dani Gobble Croitoru MD Electronically signed by Sanda Klein MD Signature Date/Time: 03/09/2019/3:16:38 PM    Final    VAS [place] LOWER EXTREMITY VENOUS (DVT)  Result Date: 03/20/2019  Lower Venous Study Indications: Pain, and Edema. Other Indications: Acute respiratory failure and acute pulmonary edema. Limitations: Body habitus and Patient's inability to tolerate full compression of the vein. Comparison Study: Negative right lower ext. venous study on 03/13/2019. Performing Technologist: Oda Cogan RDMS, RVT  Examination Guidelines: A complete evaluation includes B-mode imaging, spectral Doppler, color Doppler, and power Doppler as needed of all accessible portions of each vessel. Bilateral testing is considered an integral part of a complete examination. Limited examinations for reoccurring indications may be performed as noted.  +---------+---------------+---------+-----------+----------+-------------------+ RIGHT    CompressibilityPhasicitySpontaneityPropertiesThrombus Aging      +---------+---------------+---------+-----------+----------+-------------------+ CFV      Full           Yes      Yes                                      +---------+---------------+---------+-----------+----------+-------------------+  SFJ      Full                                                             +---------+---------------+---------+-----------+----------+-------------------+ FV Prox  Full                                                             +---------+---------------+---------+-----------+----------+-------------------+ FV Mid                  Yes       Yes                  patient unable to                                                         tolerate                                                                  compression         +---------+---------------+---------+-----------+----------+-------------------+ FV Distal               Yes      Yes                  patient unable to                                                         tolerate                                                                  compression         +---------+---------------+---------+-----------+----------+-------------------+ POP      Full           Yes      Yes                                      +---------+---------------+---------+-----------+----------+-------------------+ PTV      Full                                                             +---------+---------------+---------+-----------+----------+-------------------+  PERO     Full                                                             +---------+---------------+---------+-----------+----------+-------------------+ Patent femoral vein by color Doppler imaging.  +---------+---------------+---------+-----------+----------+-------------------+ LEFT     CompressibilityPhasicitySpontaneityPropertiesThrombus Aging      +---------+---------------+---------+-----------+----------+-------------------+ CFV      Full                                                             +---------+---------------+---------+-----------+----------+-------------------+ SFJ      Full                                                             +---------+---------------+---------+-----------+----------+-------------------+ FV Prox  Full                                                             +---------+---------------+---------+-----------+----------+-------------------+ FV Mid                  Yes      Yes                  patient unable  to                                                         tolerate                                                                  compression         +---------+---------------+---------+-----------+----------+-------------------+ FV Distal               Yes      Yes                  patient unable to                                                         tolerate  compression         +---------+---------------+---------+-----------+----------+-------------------+ PFV      Full                                                             +---------+---------------+---------+-----------+----------+-------------------+ POP      Full           Yes      Yes                                      +---------+---------------+---------+-----------+----------+-------------------+ PTV      Full                                                             +---------+---------------+---------+-----------+----------+-------------------+ PERO     Full                                                             +---------+---------------+---------+-----------+----------+-------------------+ Patent femoral vein by color Doppler imaging.   Summary: Right: There is no evidence of obvious deep vein thrombosis in the lower extremity. No cystic structure found in the popliteal fossa. Left: There is no evidence of obvious deep vein thrombosis in the lower extremity. No cystic structure found in the popliteal fossa.  *See table(s) above for measurements and observations. Electronically signed by Ruta Hinds MD on 03/20/2019 at 9:43:27 AM.    Final    VAS [place] LOWER EXTREMITY VENOUS (DVT)  Result Date: 03/13/2019  Lower Venous Study Indications: Pain.  Limitations: Pain tolerance. Comparison Study: no prior Performing Technologist: Abram Sander RVS  Examination Guidelines: A complete evaluation includes B-mode  imaging, spectral Doppler, color Doppler, and power Doppler as needed of all accessible portions of each vessel. Bilateral testing is considered an integral part of a complete examination. Limited examinations for reoccurring indications may be performed as noted.  +---------+---------------+---------+-----------+----------+-------------------+ RIGHT    CompressibilityPhasicitySpontaneityPropertiesThrombus Aging      +---------+---------------+---------+-----------+----------+-------------------+ CFV      Full           Yes      Yes                                      +---------+---------------+---------+-----------+----------+-------------------+ SFJ      Full                                                             +---------+---------------+---------+-----------+----------+-------------------+ FV Prox  Full                                                             +---------+---------------+---------+-----------+----------+-------------------+  FV Mid                  Yes      Yes                  unable to tolerate                                                        compression         +---------+---------------+---------+-----------+----------+-------------------+ FV Distal               Yes      Yes                  unable to tolerate                                                        compression         +---------+---------------+---------+-----------+----------+-------------------+ PFV      Full                                                             +---------+---------------+---------+-----------+----------+-------------------+ POP      Full           Yes      Yes                                      +---------+---------------+---------+-----------+----------+-------------------+ PTV      Full                                                              +---------+---------------+---------+-----------+----------+-------------------+ PERO     Full                                                             +---------+---------------+---------+-----------+----------+-------------------+   +----+---------------+---------+-----------+----------+--------------+ LEFTCompressibilityPhasicitySpontaneityPropertiesThrombus Aging +----+---------------+---------+-----------+----------+--------------+ CFV Full           Yes                                          +----+---------------+---------+-----------+----------+--------------+     Summary: Right: There is no evidence of deep vein thrombosis in the lower extremity. However, portions of this examination were limited- see technologist comments above. No cystic structure found in the popliteal fossa. Left: No evidence of common femoral vein obstruction.  *See table(s) above for measurements and observations. Electronically  signed by Monica Martinez MD on 03/13/2019 at 1:49:39 PM.    Final    [place] EKG SITE RITE  Result Date: 03/08/2019 If Site Rite image not attached, placement could not be confirmed due to current cardiac rhythm.      Subjective: Patient being discharged today to Home with Hospcie  Discharge Exam: Vitals:   04/05/19 1105 04/05/19 1210  BP:  114/66  Pulse:  60  Resp:  18  Temp:  97.6 F (36.4 C)  SpO2: 95% 94%   Vitals:   04/05/19 0446 04/05/19 0739 04/05/19 1105 04/05/19 1210  BP: 102/63 (!) 104/54  114/66  Pulse: (!) 59 60  60  Resp: 16 19  18   Temp: 97.9 F (36.6 C) 97.8 F (36.6 C)  97.6 F (36.4 C)  TempSrc: Oral Oral  Oral  SpO2: 99% 94% 95% 94%  Weight:      Height:        General: Pt is alert, awake, weak Cardiovascular: RRR, S1/S2 +, no rubs, no gallops Respiratory: CTA bilaterally, no wheezing, no rhonchi Abdominal: Soft, NT, ND, bowel sounds + Extremities: trace edema, no cyanosis    The results of significant diagnostics from this  hospitalization (including imaging, microbiology, ancillary and laboratory) are listed below for reference.     Microbiology: Recent Results (from the past 240 hour(s))  SARS CORONAVIRUS 2 (TAT 6-24 HRS) Nasopharyngeal Nasopharyngeal Swab     Status: None   Collection Time: 04/03/19  1:04 AM   Specimen: Nasopharyngeal Swab  Result Value Ref Range Status   SARS Coronavirus 2 NEGATIVE NEGATIVE Final    Comment: (NOTE) SARS-CoV-2 target nucleic acids are NOT DETECTED. The SARS-CoV-2 RNA is generally detectable in upper and lower respiratory specimens during the acute phase of infection. Negative results do not preclude SARS-CoV-2 infection, do not rule out co-infections with other pathogens, and should not be used as the sole basis for treatment or other patient management decisions. Negative results must be combined with clinical observations, patient history, and epidemiological information. The expected result is Negative. Fact Sheet for Patients: SugarRoll.be Fact Sheet for Healthcare Providers: https://www.woods-mathews.com/ This test is not yet approved or cleared by the [place] FDA and  has been authorized for detection and/or diagnosis of SARS-CoV-2 by FDA under an Emergency Use Authorization (EUA). This EUA will remain  in effect (meaning this test can be used) for the duration of the COVID-19 declaration under Section 56 4(b)(1) of the Act, 21 U.S.C. section 360bbb-3(b)(1), unless the authorization is terminated or revoked sooner. Performed at [facility], [place] [address], [place], [place] [address]      Labs: BNP (last 3 results) Recent Labs    03/07/19 2101 04/03/19 0040  BNP 1,072.0* 4,097.3*   Basic Metabolic Panel: Recent Labs  Lab 04/02/19 2118 04/04/19 0613  NA 127* 130*  K 5.0 3.8  CL 86* 93*  CO2 19* 23  GLUCOSE 226* 74  BUN 62* 31*  CREATININE 6.18* 3.73*  CALCIUM 8.5* 8.1*   Liver Function  Tests: Recent Labs  Lab 04/03/19 0040  AST 52*  ALT 40  ALKPHOS 422*  BILITOT 7.0*  PROT 7.5  ALBUMIN 2.1*   No results for input(s): LIPASE, AMYLASE in the last 168 hours. No results for input(s): AMMONIA in the last 168 hours. CBC: Recent Labs  Lab 04/02/19 2118 04/04/19 0613  WBC 14.6* 13.7*  HGB 8.8* 8.4*  HCT 27.3* 25.3*  MCV 83.2 83.2  PLT 300 269  Cardiac Enzymes: No results for input(s): CKTOTAL, CKMB, CKMBINDEX, TROPONINI in the last 168 hours. BNP: Invalid input(s): POCBNP CBG: Recent Labs  Lab 04/04/19 2111 04/05/19 0557 04/05/19 0616 04/05/19 0647 04/05/19 1119  GLUCAP 148* 64* 55* 83 145*   D-Dimer No results for input(s): DDIMER in the last 72 hours. Hgb A1c No results for input(s): HGBA1C in the last 72 hours. Lipid Profile No results for input(s): CHOL, HDL, LDLCALC, TRIG, CHOLHDL, LDLDIRECT in the last 72 hours. Thyroid function studies No results for input(s): TSH, T4TOTAL, T3FREE, THYROIDAB in the last 72 hours.  Invalid input(s): FREET3 Anemia work up No results for input(s): VITAMINB12, FOLATE, FERRITIN, TIBC, IRON, RETICCTPCT in the last 72 hours. Urinalysis    Component Value Date/Time   COLORURINE YELLOW 03/03/2017 1229   APPEARANCEUR CLOUDY (A) 03/03/2017 1229   LABSPEC 1.009 03/03/2017 1229   PHURINE 7.0 03/03/2017 1229   GLUCOSEU NEGATIVE 03/03/2017 1229   GLUCOSEU NEGATIVE 10/14/2014 1102   HGBUR NEGATIVE 03/03/2017 1229   BILIRUBINUR NEGATIVE 03/03/2017 1229   KETONESUR NEGATIVE 03/03/2017 1229   PROTEINUR 30 (A) 03/03/2017 1229   UROBILINOGEN 0.2 10/14/2014 1102   NITRITE NEGATIVE 03/03/2017 1229   LEUKOCYTESUR TRACE (A) 03/03/2017 1229   Sepsis Labs Invalid input(s): PROCALCITONIN,  WBC,  LACTICIDVEN Microbiology Recent Results (from the past 240 hour(s))  SARS CORONAVIRUS 2 (TAT 6-24 HRS) Nasopharyngeal Nasopharyngeal Swab     Status: None   Collection Time: 04/03/19  1:04 AM   Specimen: Nasopharyngeal Swab   Result Value Ref Range Status   SARS Coronavirus 2 NEGATIVE NEGATIVE Final    Comment: (NOTE) SARS-CoV-2 target nucleic acids are NOT DETECTED. The SARS-CoV-2 RNA is generally detectable in upper and lower respiratory specimens during the acute phase of infection. Negative results do not preclude SARS-CoV-2 infection, do not rule out co-infections with other pathogens, and should not be used as the sole basis for treatment or other patient management decisions. Negative results must be combined with clinical observations, patient history, and epidemiological information. The expected result is Negative. Fact Sheet for Patients: SugarRoll.be Fact Sheet for Healthcare Providers: https://www.woods-mathews.com/ This test is not yet approved or cleared by the [place] FDA and  has been authorized for detection and/or diagnosis of SARS-CoV-2 by FDA under an Emergency Use Authorization (EUA). This EUA will remain  in effect (meaning this test can be used) for the duration of the COVID-19 declaration under Section 56 4(b)(1) of the Act, 21 U.S.C. section 360bbb-3(b)(1), unless the authorization is terminated or revoked sooner. Performed at [facility], [place] [address], [place], [place] [address]     Please note: You were cared for by a hospitalist during your hospital stay. Once you are discharged, your primary care physician will handle any further medical issues. Please note that NO REFILLS for any discharge medications will be authorized once you are discharged, as it is imperative that you return to your primary care physician (or establish a relationship with a primary care physician if you do not have one) for your post hospital discharge needs so that they can reassess your need for medications and monitor your lab values.    Time coordinating discharge: 40 minutes  SIGNED:   Shelly Coss, MD  [facility] 04/05/2019,  3:24 PM Pager [phone]  If 7PM-7AM, please contact night-coverage [email or web address] Password [email or web address]

## 2019-04-05 NOTE — Progress Notes (Signed)
Renal Navigator notified OP HD clinic/[facility] of plan to stop HD and discharge with Hospice.  Alphonzo Cruise, [address] Renal Navigator [phone]

## 2019-04-05 NOTE — Progress Notes (Signed)
Nutrition Brief Note  RD consulted for assessment of nutritional needs/ status.   Wt Readings from Last 15 Encounters:  04/05/19 72 kg  03/29/19 73.9 kg  03/03/19 87.5 kg  02/10/19 82.6 kg  02/03/19 82.6 kg  01/14/19 81.7 kg  11/29/18 77.1 kg  11/27/18 77.1 kg  10/14/18 82.6 kg  10/05/18 77.1 kg  06/23/18 77.1 kg  02/13/18 78.2 kg  02/05/18 77.1 kg  12/25/17 76.7 kg  11/24/17 77.7 kg   [name] is a 59 y.o. 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  Denies any chest pain shortness of breath productive cough.  Denies any fever chills.  Pt admitted with acute on chronic systolic heart failure on HD.   Reviewed I/O's: +540 ml x 24 hours and -980 ml since admission  11.7% Spoke with pt and husband at bedside. Pt pleasant but largest complaint is dry lips. Pt husband confimred that she consumed almost all of her breakfast of oatmeal, eggs, toast and milk. Husband also brought in some canned peaches for her, which she enjoys.   Pt reports good appetite PTA. She generally consumes 3 meals per day (Breakfast: scrambled eggs, bacon, toast, and oatmeal; Lunch: meat, starch, and vegetable; Dinner: same). Pt husband and pt daughter assist with meal preparation.   Pt and husband unsure if she has lost weight. Noted pt has experienced a 17.7% wt loss over the past month, which is significant for time frame. Per pt husband, HD initiated last month during hospitalization, which may be contributing to weight changes.   Nutrition-Focused  physical exam completed. Findings are no fat depletion, no muscle depletion, and mild edema.    Lab Results  Component Value Date   HGBA1C 7.2 (H) 01/18/2019  PTA DM medications are 56 units tresiba at breakfast, 34 units insulin NPH daily, 12 units insulin glargine after breakfast, 14 units insulin aspart at lunch, and 16 units insulin aspart at dinner.   Labs reviewed: CBGS: 55-148 (inpatient orders for glycemic control are 0-9 units insulin aspart TID with meals and 25 units insulin glargine q HS).   Current diet order is renal/ carb modified diet with 1.2 L fluid restriction, patient is consuming approximately 100% of meals at this time. Labs and medications reviewed.   No nutrition interventions warranted at this time. Per MD notes, plan to transition to residential hospice, as pt and husband do no desire to pursue HD any longer. If nutrition issues arise, please consult RD.   Loistine Chance, RD, LDN, [place] Registered Dietitian II Certified Diabetes Care and Education Specialist Please refer to [facility] for RD and/or RD on-call/weekend/after hours pager

## 2019-04-05 NOTE — Progress Notes (Signed)
PROGRESS NOTE    [name]  ZSW:[id number] DOB: 1960-05-03 DOA: 04/02/2019 PCP: [name], MD   Brief Narrative: Patient is a 59 year old female with history of nonischemic cardiomyopathy with systolic dysfunction, ejection fraction of 15 to 20%, status post CRT-D, end-stage renal disease on hemodialysis, insulin-dependent large mellitus, chronic atrial fibrillation who presented with progressive worsening generalized weakness, anasarca.  She was admitted for the management of acute on chronic systolic CHF.  Cardiology and nephrology were following.  Cardiology have signed off recommending residential hospice. 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  Ejection fraction of 15 to 20%.  Very poor prognosis.  Cardiology was initially following but have signed off recommending residential hospice and no further recommendation given her advanced disease.She has CRT-D  ESRD: On hemodialysis TTS.   She is volume overloaded.  Next plan for hemodialysis was today.  Patient is not interested to continue dialysis.  Anasarca: She has significant bilateral lower extremity edema.  Chest x-ray did not show any pulmonary edema.  Undergoing hemodialysis.  On midodrine 3 times daily.  Anemia of chronic disease: Hemoglobin in the range of 8.  Not on ESA.  Secondary hyperparathyroidism: Calcium is normal.  Not on binders  Hypotension: Chronic.  On midodrine.  Was given albumin during  hemodialysis  Severe protein calorie moderation: Continue supportive care  History of multiple myeloma: New diagnosis on last admission.  Scheduled for follow-up as outpatient on 2/10.  Suspected Warfarin skin necrosis: Punch biopsy was negative for calciphylaxis  Chronic A. fib: On Eliquis.  Currently rate is controlled  Diabetes type 2: Continue sliding scale and Lantus.  Goals of care: Cardiology have recommended palliative care consultation and hospice approach, no further recommendation from cardiology for treatment of her nonischemic cardiomyopathy.  We have requested palliative care evaluation           DVT prophylaxis:Eliquis  Code Status: Partial Family Communication:  Disposition Plan: Patient is from ome.she has a very poor prognosis home.Likely disposition will be   home with hospice   Consultants: Cardiology, nephrology  Procedures: Hemodialysis  Antimicrobials:  Anti-infectives (From admission, onward)   None      Subjective:  Patient seen and examined at the bedside this morning.  Currently hemodynamically stable but of very weak and debilitated.  Husband was at the bedside.  She denies any acute chest pain or shortness of breath.  Patient and her husband are interested to know more about the idea of hospice.  Does not want to continue dialysis  Objective: Vitals:   04/04/19 1936 04/05/19 0039 04/05/19 0446 04/05/19 0739  BP: (!) 109/54 110/63 102/63 (!) 104/54  Pulse: (!) 59 60 (!) 59 60  Resp: 16 16 16 19   Temp: 98.5 F (36.9 C) 98.3 F (36.8 C) 97.9 F (36.6 C) 97.8 F (36.6 C)  TempSrc: Oral Oral Oral Oral  SpO2: 99% 96% 99% 94%  Weight:  72 kg    Height:  Intake/Output Summary (Last 24 hours) at 04/05/2019 0948 Last data filed at 04/05/2019 0900 Gross per 24 hour  Intake 720 ml  Output --  Net 720 ml   Filed Weights   04/03/19 1835 04/04/19 0550 04/05/19 0039  Weight: 68.8 kg 71.1 kg 72 kg    Examination:  General exam:  Extremely deconditioned, generalized weakness, chronically ill looking HEENT:PERRL,Oral mucosa moist, Ear/Nose normal on gross exam Respiratory system: Bilateral decreased air entry in the bases Cardiovascular system: Afib. No JVD, systolic murmur present , .Trace pedal edema. Gastrointestinal system: Abdomen is nondistended, soft and nontender.  Normal bowel sounds heard.  Severe edema of the abdomen wall Central nervous system: Alert and oriented. No focal neurological deficits. Extremities: Severe edema of the skin of upper thighs, no clubbing ,no cyanosis,. Skin: No rashes, lesions or ulcers,no icterus ,no pallor    Data Reviewed: I have personally reviewed following labs and imaging studies  CBC: Recent Labs  Lab 04/02/19 2118 04/04/19 0613  WBC 14.6* 13.7*  HGB 8.8* 8.4*  HCT 27.3* 25.3*  MCV 83.2 83.2  PLT 300 195   Basic Metabolic Panel: Recent Labs  Lab 03/29/19 1231 04/02/19 2118 04/04/19 0613  NA 128* 127* 130*  K 3.9 5.0 3.8  CL 89* 86* 93*  CO2 22 19* 23  GLUCOSE 134* 226* 74  BUN 21* 62* 31*  CREATININE 2.75* 6.18* 3.73*  CALCIUM 8.6* 8.5* 8.1*   GFR: Estimated Creatinine Clearance: 16.5 mL/min (A) (by C-G formula based on SCr of 3.73 mg/dL (H)). Liver Function Tests: Recent Labs  Lab 04/03/19 0040  AST 52*  ALT 40  ALKPHOS 422*  BILITOT 7.0*  PROT 7.5  ALBUMIN 2.1*   No results for input(s): LIPASE, AMYLASE in the last 168 hours. No results for input(s): AMMONIA in the last 168 hours. Coagulation Profile: No results for input(s): INR, PROTIME in the last 168 hours. Cardiac Enzymes: No results for input(s): CKTOTAL, CKMB, CKMBINDEX, TROPONINI in the last 168 hours. BNP (last 3 results) No results for input(s): PROBNP in the last 8760 hours. HbA1C: No results for input(s): HGBA1C in the last 72 hours. CBG: Recent Labs  Lab 04/04/19 1628 04/04/19 2111 04/05/19 0557 04/05/19 0616 04/05/19 0647  GLUCAP 139* 148* 64* 55* 83   Lipid  Profile: No results for input(s): CHOL, HDL, LDLCALC, TRIG, CHOLHDL, LDLDIRECT in the last 72 hours. Thyroid Function Tests: No results for input(s): TSH, T4TOTAL, FREET4, T3FREE, THYROIDAB in the last 72 hours. Anemia Panel: No results for input(s): VITAMINB12, FOLATE, FERRITIN, TIBC, IRON, RETICCTPCT in the last 72 hours. Sepsis Labs: No results for input(s): PROCALCITON, LATICACIDVEN in the last 168 hours.  Recent Results (from the past 240 hour(s))  SARS CORONAVIRUS 2 (TAT 6-24 HRS) Nasopharyngeal Nasopharyngeal Swab     Status: None   Collection Time: 04/03/19  1:04 AM   Specimen: Nasopharyngeal Swab  Result Value Ref Range Status   SARS Coronavirus 2 NEGATIVE NEGATIVE Final    Comment: (NOTE) SARS-CoV-2 target nucleic acids are NOT DETECTED. The SARS-CoV-2 RNA is generally detectable in upper and lower respiratory specimens during the acute phase of infection. Negative results do not preclude SARS-CoV-2 infection, do not rule out co-infections with other pathogens, and should not be used as the sole basis for treatment or other patient management decisions. Negative results must be combined with clinical observations, patient history, and epidemiological information. The expected result is Negative. Fact Sheet for Patients: SugarRoll.be Fact Sheet for Healthcare Providers: https://www.woods-mathews.com/ This test is not  yet approved or cleared by the [place] and  has been authorized for detection and/or diagnosis of SARS-CoV-2 by FDA under an Emergency Use Authorization (EUA). This EUA will remain  in effect (meaning this test can be used) for the duration of the COVID-19 declaration under Section 56 4(b)(1) of the Act, 21 U.S.C. section 360bbb-3(b)(1), unless the authorization is terminated or revoked sooner. Performed at [facility], [place] [address], [place], [place] [address]          Radiology Studies: No  results found.      Scheduled Meds: . allopurinol  150 mg Oral Daily  . apixaban  5 mg Oral BID  . Chlorhexidine Gluconate Cloth  6 each Topical Q0600  . dextrose      . gabapentin  300 mg Oral Daily  . insulin aspart  0-9 Units Subcutaneous TID WC  . insulin glargine  25 Units Subcutaneous QHS  . midodrine  15 mg Oral TID WC  . multivitamin  1 tablet Oral QHS   Continuous Infusions: . albumin human 12.5 g (04/03/19 1535)     LOS: 2 days    Time spent: 35 mins.More than 50% of that time was spent in counseling and/or coordination of care.      Shelly Coss, MD [facility] P2/09/2019, 9:48 AM

## 2019-04-05 NOTE — Progress Notes (Signed)
Patient's [place] is tunneled and has to be removed by IR, RN Rona Ravens made aware

## 2019-04-05 NOTE — Progress Notes (Signed)
Pt doesn't want any further HD so the other option is hospice which both of them are ok with.  Per primary MD. Will sign off.   Kelly Splinter, MD 04/05/2019, 10:52 AM

## 2019-04-05 NOTE — Progress Notes (Signed)
Pt still did not allow this nurse to look at back and buttocks for full assessment. Stated she was "too sore" to be turned over.

## 2019-04-05 NOTE — TOC Transition Note (Addendum)
Transition of Care [facility]) - CM/SW Discharge Note   Patient Details  Name: [name] MRN: [id number] Date of Birth: Sep 29, 1960  [facility]) CM/SW Contact:  Zenon Mayo, RN Phone Number: 04/05/2019, 12:23 PM   Clinical Narrative:    NCM received referral from palliative, stating family wants [facility].  NCM contacted spouse and confirmed he wants [facility].  Referral made to [facility] with [facility]. NCM faxed information over to [facility], awaiting palliative note to be completed so it can be faxed also.  Patient will need ptar transport when discharge.  Spouse states she has a walker, w/chair, shower stool, and BSC, states she may need hospital bed but states patient can come home without be being there.   NCM awaiting call back from [facility].  NCM received call from [place].  NCM scheduled ptar, Staff RN states IR has to take out tunneled catheter. So ptar put on hold until that is removed and Staff RN to call ptar for transport.   Final next level of care: Home w Hospice Care Barriers to Discharge: No Barriers Identified   Patient Goals and CMS Choice Patient states their goals for this hospitalization and ongoing recovery are:: home with hospice CMS Medicare.gov Compare Post Acute Care list provided to:: Patient Represenative (must comment)(spouse, Palliaitve rep states family wants [facility] [place]) Choice offered to / list presented to : Spouse  Discharge Placement                       Discharge Plan and Services                DME Arranged: (Hospice to Arrange DME)         HH Arranged: RN HH Agency: [facility] Date HH Agency Contacted: 04/05/19 Time [place]: 1223 Representative spoke with at [place]: [place] Determinants of Health ([place]) Interventions     Readmission Risk  Interventions Readmission Risk Prevention Plan 03/29/2019  Transportation Screening Complete  Medication Review Press photographer) Complete  HRI or [place] Complete  SW Recovery Care/Counseling Consult Complete  Palliative Care Screening Complete  [place] Patient Refused  Some recent data might be hidden

## 2019-04-05 NOTE — Progress Notes (Signed)
PTAR arrived to transport pt home. Report given. Pt in no apparent distress. Belongings sent with husband who was at bedside.

## 2019-04-05 NOTE — Progress Notes (Signed)
CBG was 64. Grape juice, peanut butter and graham crackers given. Rechecked CBG after 15 minutes and was 55. See orders.

## 2019-04-05 NOTE — Progress Notes (Signed)
Inpatient Diabetes Program Recommendations  AACE/ADA: New Consensus Statement on Inpatient Glycemic Control (2015)  Target Ranges:  Prepandial:   less than 140 mg/dL      Peak postprandial:   less than 180 mg/dL (1-2 hours)      Critically ill patients:  140 - 180 mg/dL   Lab Results  Component Value Date   GLUCAP 145 (H) 04/05/2019   HGBA1C 7.2 (H) 01/18/2019    Review of Glycemic Control Results for ELIANY, MCCARTER (MRN [id number]) as of 04/05/2019 13:34  Ref. Range 04/04/2019 21:11 04/05/2019 05:57 04/05/2019 06:16 04/05/2019 06:47 04/05/2019 11:19  Glucose-Capillary Latest Ref Range: 70 - 99 mg/dL 148 (H) 64 (L) 55 (L) 83 145 (H)   Current orders for Inpatient glycemic control:  Novolog sensitive tid with meals and HS  Inpatient Diabetes Program Recommendations:    Note low blood sugars.  Lantus stopped. If appropriate may also consider reducing Novolog correction to very sensitive starting at 151 mg/dL (0-6 units).    Thanks,  Adah Perl, RN, BC-ADM Inpatient Diabetes Coordinator Pager [phone]

## 2019-04-05 NOTE — Procedures (Signed)
Per nephrologist order, pt's right IJ tunneled HD catheter was removed in its entirety without immediate complications. Gauze dressing applied to site. EBL< 3 cc.

## 2019-04-06 ENCOUNTER — Telehealth (HOSPITAL_COMMUNITY): Payer: Self-pay

## 2019-04-06 NOTE — Telephone Encounter (Signed)

## 2019-04-07 ENCOUNTER — Inpatient Hospital Stay (HOSPITAL_COMMUNITY): Payer: Medicaid Other | Admitting: Hematology

## 2019-04-07 ENCOUNTER — Encounter (HOSPITAL_COMMUNITY): Payer: Medicaid Other | Admitting: Cardiology

## 2019-04-07 ENCOUNTER — Telehealth (HOSPITAL_COMMUNITY): Payer: Self-pay

## 2019-04-08 ENCOUNTER — Ambulatory Visit: Payer: Medicaid Other

## 2019-04-26 NOTE — Telephone Encounter (Signed)
Contacted patient's sister Ivin Booty) regarding appt scheduled for today.  Ivin Booty informed me that the patient passed away this morning around 4AM.

## 2019-04-26 DEATH — deceased

## 2020-05-17 IMAGING — US US ABDOMEN LIMITED
1 series · 14 of 25 positions shown · non-contrast
Comparison: Abdomen and pelvis CT dated 03/03/2017.

CLINICAL DATA: Clinical concern for splenomegaly.

EXAM:
ULTRASOUND ABDOMEN LIMITED

[Series 1: us abdomen limited · 0.22mm/px · 14 of 50 slices shown]
[im 1/50]
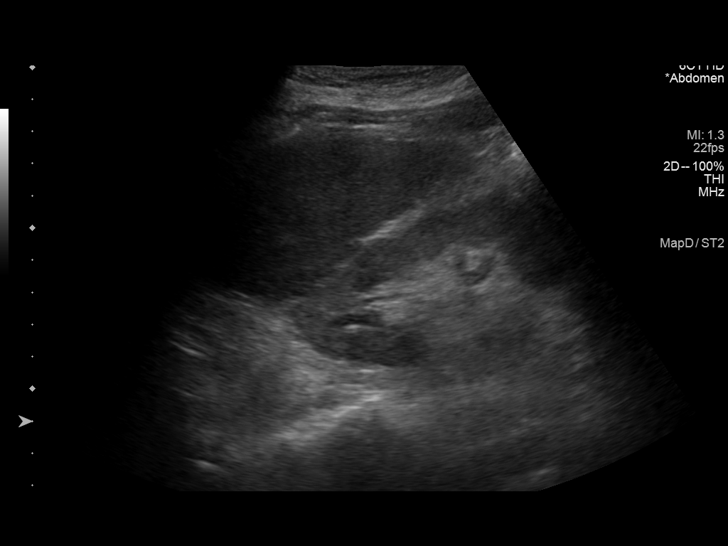
[im 5/50]
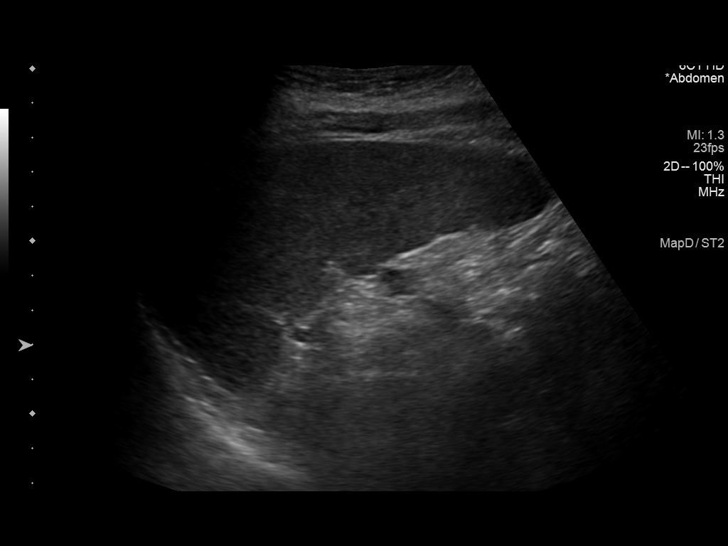
[im 9/50]
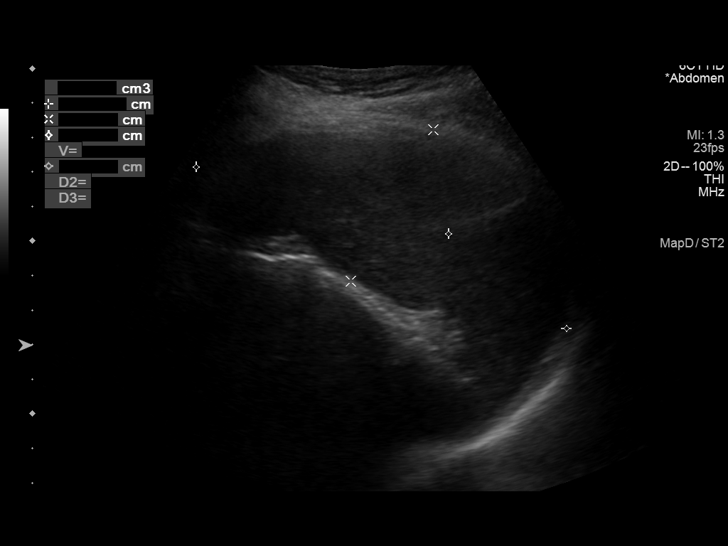
[im 13/50]
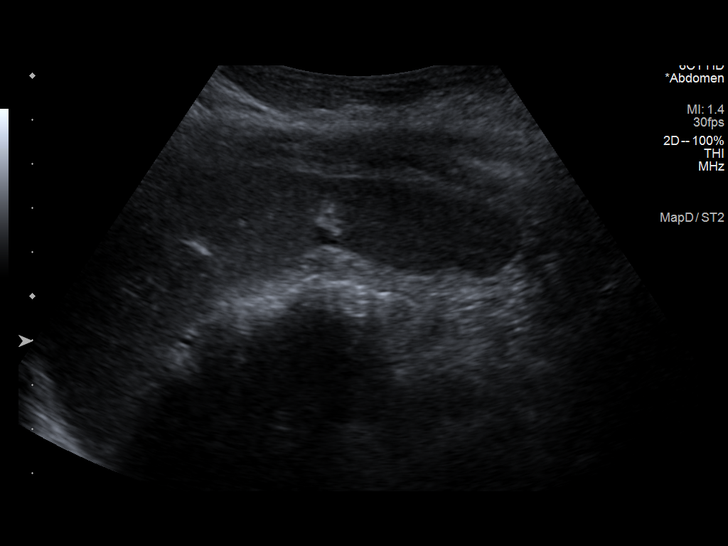
[im 17/50]
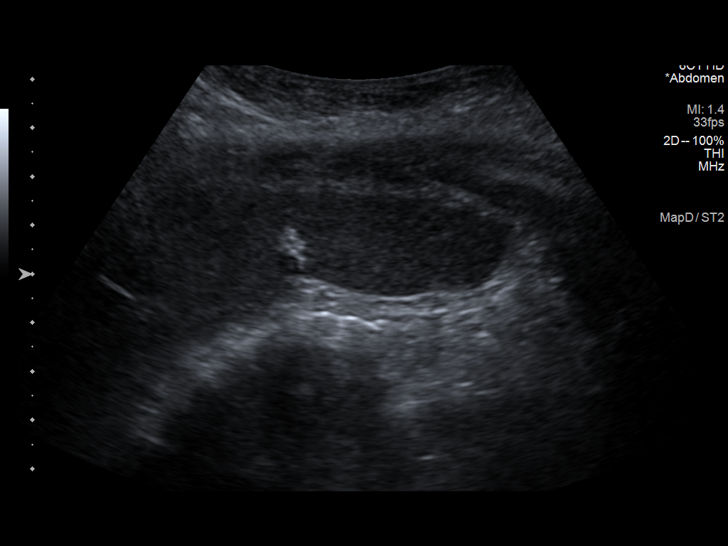
[im 19/50]
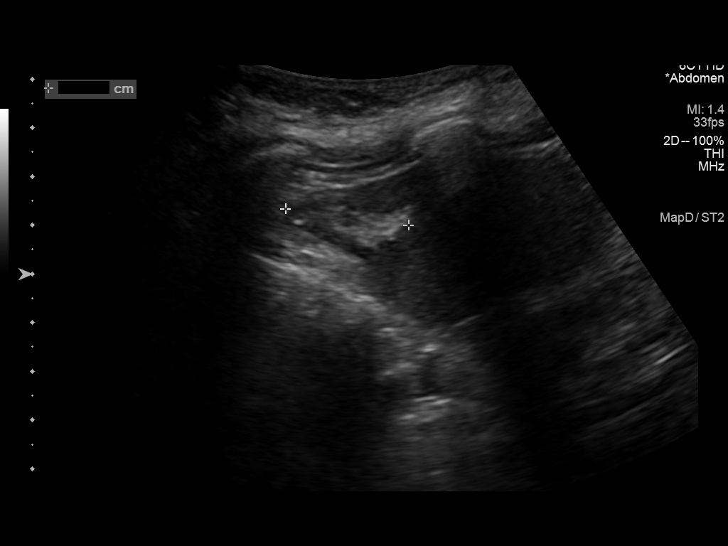
[im 23/50]
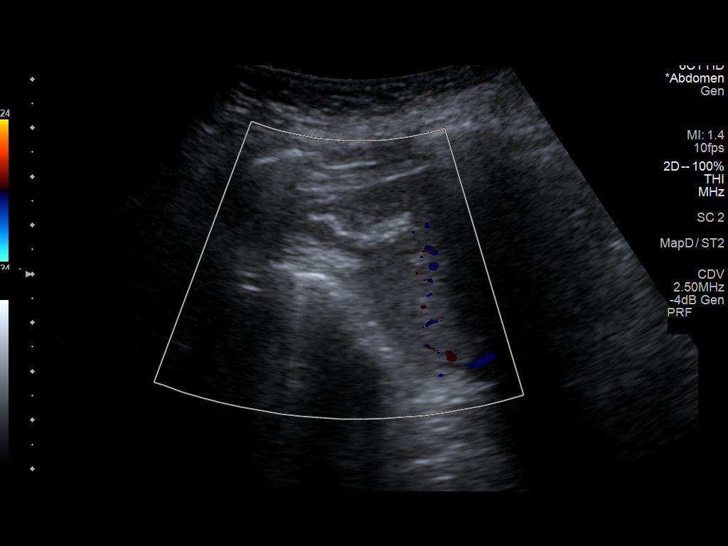
[im 27/50]
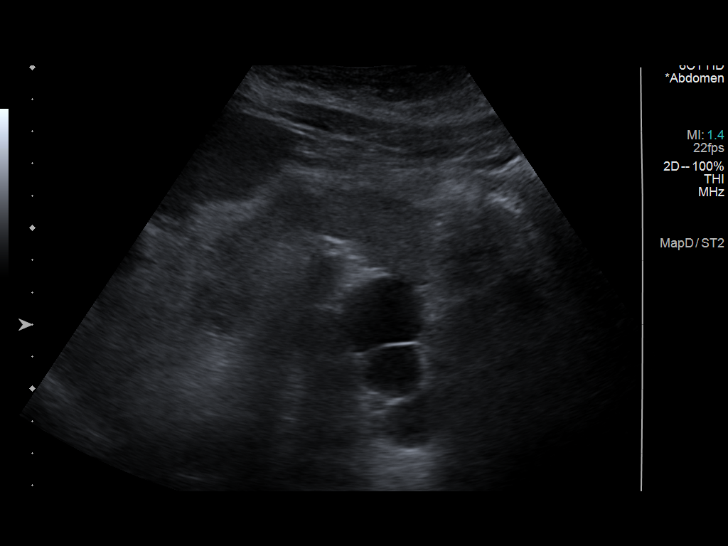
[im 31/50]
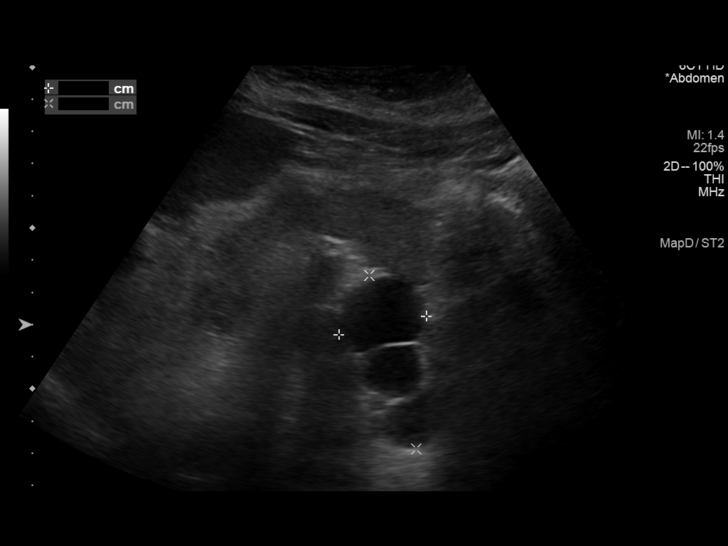
[im 33/50]
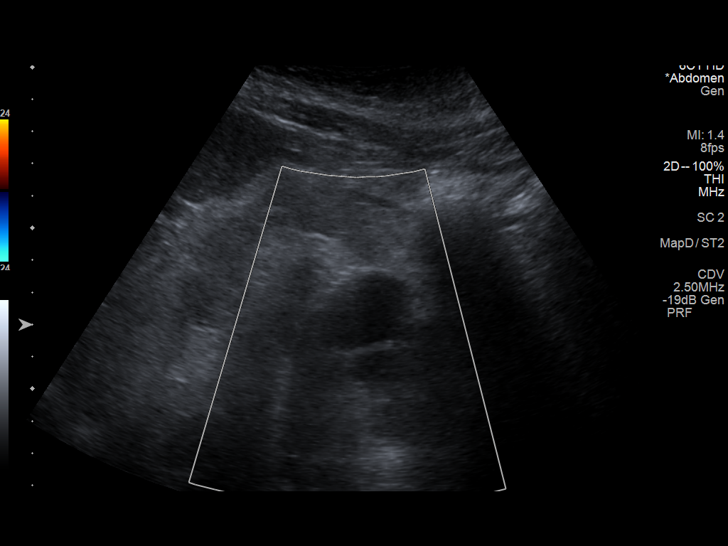
[im 37/50]
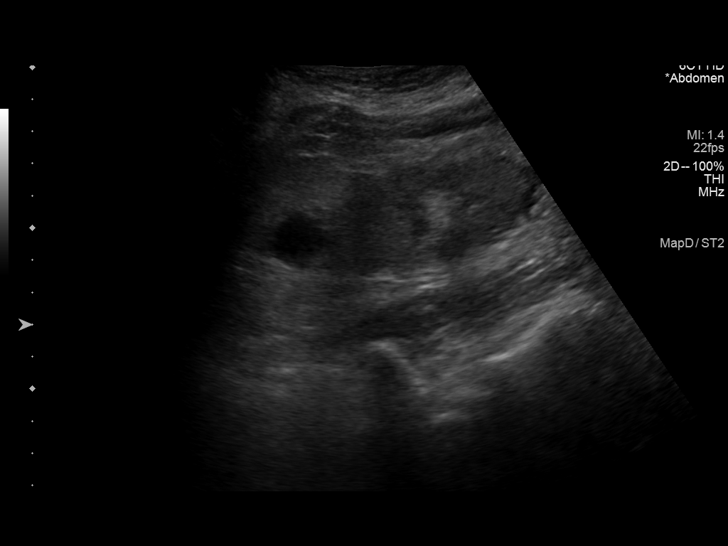
[im 41/50]
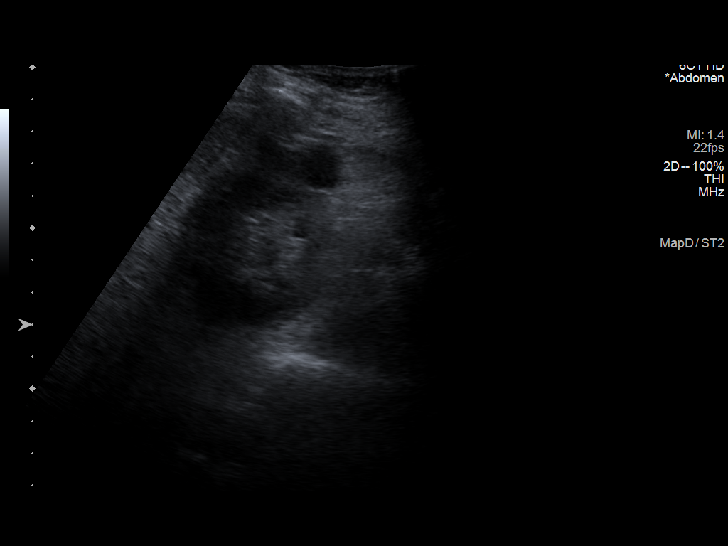
[im 45/50]
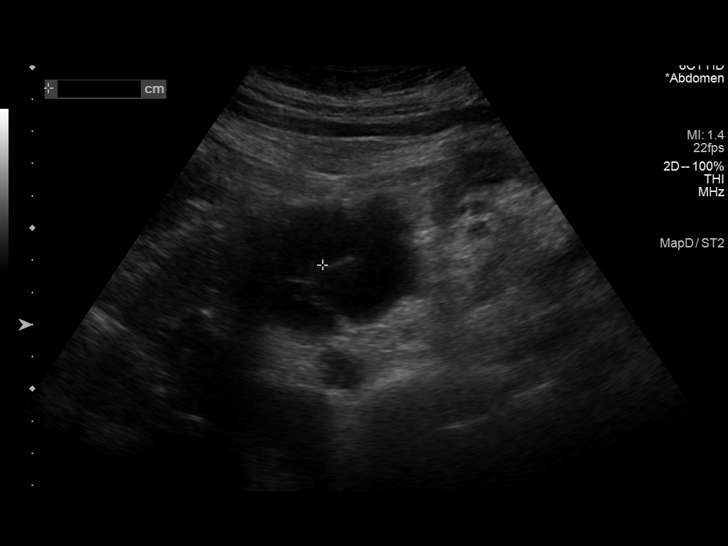
[im 50/50]
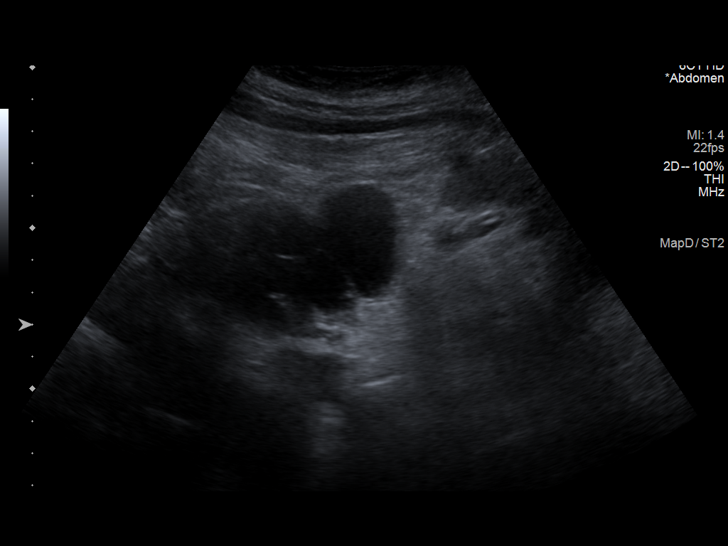

[14 of 25 positions shown; findings below may reference images not displayed]

FINDINGS: Normal appearing spleen, measuring 12.7 cm in length with a
calculated volume of 250 cc. Normal fatty tissue in the splenic
hilum.

1.9 cm simple left renal cyst.

Previously demonstrated left retroperitoneal varices.
IMPRESSION: 1. Normal appearing spleen without splenomegaly.
2. Previously demonstrated left retroperitoneal varices.

## 2020-05-17 IMAGING — DX DG PELVIS 1-2V
1 series · 1 of 1 positions shown · non-contrast
Comparison: None.

CLINICAL DATA: Bilateral upper leg pain.

EXAM:
PELVIS - 1-2 VIEW

[pelvis ap]
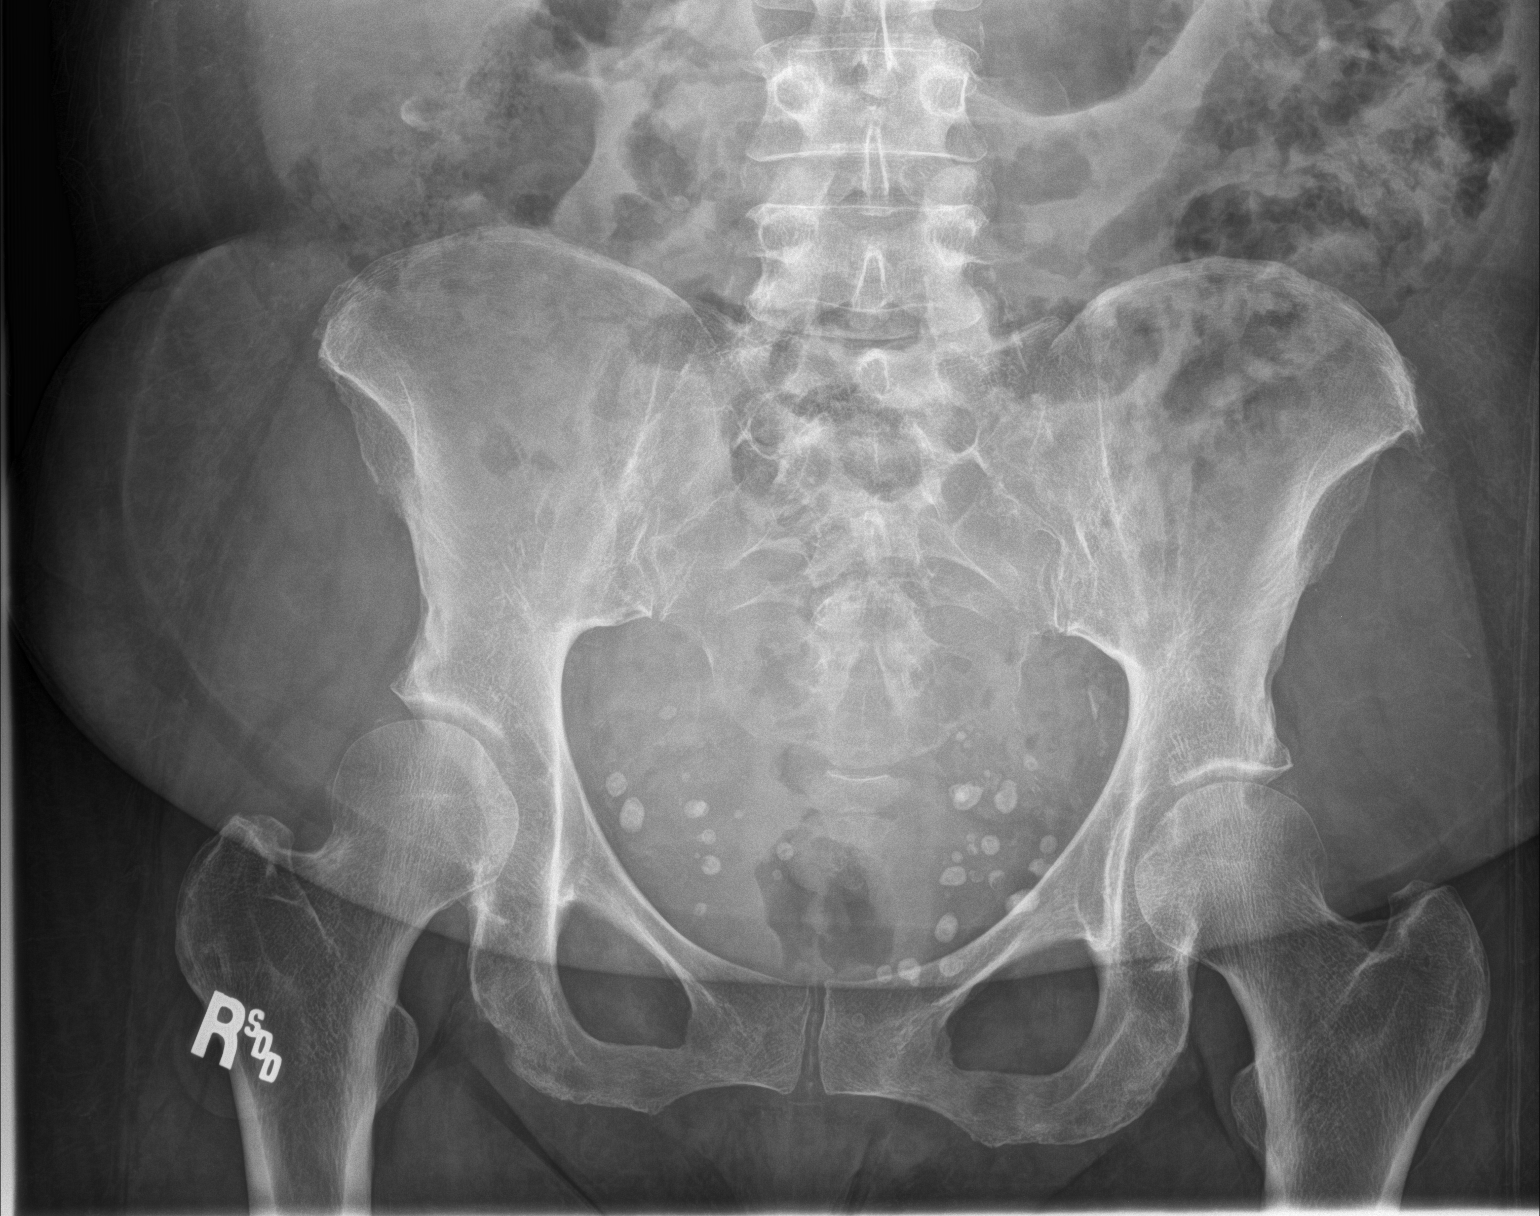

[1 of 1 positions shown; findings below may reference images not displayed]

FINDINGS: Hip joints and SI joints are symmetric and unremarkable. No acute
bony abnormality. Specifically, no fracture, subluxation, or
dislocation. Extensive calcified phleboliths in the pelvis.
IMPRESSION: No acute bony abnormality.
